# Patient Record
Sex: Male | Born: 1940
Health system: Southern US, Community
[De-identification: ages and names within clinical notes are randomized; demographics above are authoritative.]

## PROBLEM LIST (undated history)

## (undated) ENCOUNTER — Emergency Department (HOSPITAL_COMMUNITY): Admission: EM | Payer: Medicare Other | Source: Home / Self Care

## (undated) DIAGNOSIS — Q2381 Bicuspid aortic valve: Secondary | ICD-10-CM

## (undated) DIAGNOSIS — Q231 Congenital insufficiency of aortic valve: Secondary | ICD-10-CM

## (undated) DIAGNOSIS — Z8719 Personal history of other diseases of the digestive system: Secondary | ICD-10-CM

## (undated) DIAGNOSIS — I517 Cardiomegaly: Secondary | ICD-10-CM

## (undated) DIAGNOSIS — Z8711 Personal history of peptic ulcer disease: Secondary | ICD-10-CM

## (undated) DIAGNOSIS — R001 Bradycardia, unspecified: Secondary | ICD-10-CM

## (undated) DIAGNOSIS — I447 Left bundle-branch block, unspecified: Secondary | ICD-10-CM

## (undated) DIAGNOSIS — N189 Chronic kidney disease, unspecified: Secondary | ICD-10-CM

## (undated) DIAGNOSIS — Z9889 Other specified postprocedural states: Secondary | ICD-10-CM

## (undated) DIAGNOSIS — I509 Heart failure, unspecified: Secondary | ICD-10-CM

## (undated) DIAGNOSIS — I428 Other cardiomyopathies: Secondary | ICD-10-CM

## (undated) DIAGNOSIS — I10 Essential (primary) hypertension: Secondary | ICD-10-CM

## (undated) DIAGNOSIS — M1990 Unspecified osteoarthritis, unspecified site: Secondary | ICD-10-CM

## (undated) DIAGNOSIS — I4819 Other persistent atrial fibrillation: Secondary | ICD-10-CM

## (undated) DIAGNOSIS — K219 Gastro-esophageal reflux disease without esophagitis: Secondary | ICD-10-CM

## (undated) DIAGNOSIS — Z8679 Personal history of other diseases of the circulatory system: Secondary | ICD-10-CM

## (undated) HISTORY — PX: WRIST GANGLION EXCISION: SUR520

## (undated) HISTORY — DX: Essential (primary) hypertension: I10

## (undated) HISTORY — PX: TONSILLECTOMY: SUR1361

## (undated) HISTORY — DX: Other specified postprocedural states: Z98.890

## (undated) HISTORY — DX: Bradycardia, unspecified: R00.1

## (undated) HISTORY — DX: Gastro-esophageal reflux disease without esophagitis: K21.9

## (undated) HISTORY — PX: OTHER SURGICAL HISTORY: SHX169

## (undated) HISTORY — DX: Left bundle-branch block, unspecified: I44.7

## (undated) HISTORY — DX: Other persistent atrial fibrillation: I48.19

## (undated) HISTORY — PX: HERNIA REPAIR: SHX51

## (undated) HISTORY — PX: ROTATOR CUFF REPAIR: SHX139

## (undated) HISTORY — PX: CARDIAC CATHETERIZATION: SHX172

## (undated) HISTORY — DX: Personal history of other diseases of the circulatory system: Z86.79

## (undated) HISTORY — DX: Congenital insufficiency of aortic valve: Q23.1

## (undated) HISTORY — DX: Bicuspid aortic valve: Q23.81

## (undated) HISTORY — DX: Unspecified osteoarthritis, unspecified site: M19.90

---

## 1978-05-26 DIAGNOSIS — Z8679 Personal history of other diseases of the circulatory system: Secondary | ICD-10-CM

## 1978-05-26 HISTORY — DX: Personal history of other diseases of the circulatory system: Z86.79

## 1999-10-11 ENCOUNTER — Encounter: Payer: Self-pay | Admitting: Vascular Surgery

## 1999-10-17 ENCOUNTER — Ambulatory Visit (HOSPITAL_COMMUNITY): Admission: RE | Admit: 1999-10-17 | Discharge: 1999-10-17 | Payer: Self-pay | Admitting: Vascular Surgery

## 1999-10-17 ENCOUNTER — Encounter (INDEPENDENT_AMBULATORY_CARE_PROVIDER_SITE_OTHER): Payer: Self-pay | Admitting: Specialist

## 2004-04-17 ENCOUNTER — Ambulatory Visit: Payer: Self-pay | Admitting: Internal Medicine

## 2004-04-24 ENCOUNTER — Ambulatory Visit: Payer: Self-pay | Admitting: Internal Medicine

## 2004-07-10 ENCOUNTER — Ambulatory Visit: Payer: Self-pay | Admitting: Internal Medicine

## 2004-08-27 ENCOUNTER — Ambulatory Visit: Payer: Self-pay | Admitting: Internal Medicine

## 2004-09-03 ENCOUNTER — Ambulatory Visit: Payer: Self-pay | Admitting: Internal Medicine

## 2004-09-12 ENCOUNTER — Ambulatory Visit: Payer: Self-pay | Admitting: Internal Medicine

## 2005-03-04 ENCOUNTER — Ambulatory Visit: Payer: Self-pay | Admitting: Internal Medicine

## 2005-03-11 ENCOUNTER — Ambulatory Visit: Payer: Self-pay | Admitting: Internal Medicine

## 2005-04-08 ENCOUNTER — Ambulatory Visit: Payer: Self-pay | Admitting: Internal Medicine

## 2005-04-15 ENCOUNTER — Ambulatory Visit: Payer: Self-pay | Admitting: Internal Medicine

## 2005-06-19 ENCOUNTER — Ambulatory Visit: Payer: Self-pay | Admitting: Internal Medicine

## 2005-07-08 ENCOUNTER — Ambulatory Visit: Payer: Self-pay | Admitting: Internal Medicine

## 2005-08-07 ENCOUNTER — Ambulatory Visit: Payer: Self-pay | Admitting: Internal Medicine

## 2005-11-18 ENCOUNTER — Ambulatory Visit: Payer: Self-pay | Admitting: Internal Medicine

## 2005-12-19 ENCOUNTER — Ambulatory Visit: Payer: Self-pay | Admitting: Internal Medicine

## 2005-12-22 ENCOUNTER — Ambulatory Visit: Payer: Self-pay | Admitting: Gastroenterology

## 2005-12-26 ENCOUNTER — Encounter (INDEPENDENT_AMBULATORY_CARE_PROVIDER_SITE_OTHER): Payer: Self-pay | Admitting: *Deleted

## 2005-12-26 ENCOUNTER — Ambulatory Visit: Payer: Self-pay | Admitting: Gastroenterology

## 2005-12-30 ENCOUNTER — Ambulatory Visit (HOSPITAL_COMMUNITY): Admission: RE | Admit: 2005-12-30 | Discharge: 2005-12-30 | Payer: Self-pay | Admitting: Gastroenterology

## 2006-01-20 ENCOUNTER — Ambulatory Visit: Payer: Self-pay | Admitting: Gastroenterology

## 2006-02-23 ENCOUNTER — Ambulatory Visit: Payer: Self-pay | Admitting: Gastroenterology

## 2006-03-12 ENCOUNTER — Ambulatory Visit: Payer: Self-pay | Admitting: Internal Medicine

## 2006-09-10 ENCOUNTER — Ambulatory Visit: Payer: Self-pay | Admitting: Internal Medicine

## 2006-09-10 LAB — CONVERTED CEMR LAB
ALT: 24 units/L (ref 0–40)
AST: 31 units/L (ref 0–37)
Albumin: 4.3 g/dL (ref 3.5–5.2)
Alkaline Phosphatase: 82 units/L (ref 39–117)
BUN: 9 mg/dL (ref 6–23)
Basophils Absolute: 0 10*3/uL (ref 0.0–0.1)
Basophils Relative: 0.2 % (ref 0.0–1.0)
Bilirubin, Direct: 0.2 mg/dL (ref 0.0–0.3)
CO2: 30 meq/L (ref 19–32)
Calcium: 9.1 mg/dL (ref 8.4–10.5)
Chloride: 96 meq/L (ref 96–112)
Cholesterol: 229 mg/dL (ref 0–200)
Creatinine, Ser: 0.7 mg/dL (ref 0.4–1.5)
Direct LDL: 136.7 mg/dL
Eosinophils Absolute: 0.1 10*3/uL (ref 0.0–0.6)
Eosinophils Relative: 2.3 % (ref 0.0–5.0)
GFR calc Af Amer: 145 mL/min
GFR calc non Af Amer: 120 mL/min
Glucose, Bld: 101 mg/dL — ABNORMAL HIGH (ref 70–99)
HCT: 45.9 % (ref 39.0–52.0)
HDL: 75.7 mg/dL (ref >39.0)
Hemoglobin: 15.8 g/dL (ref 13.0–17.0)
Lymphocytes Relative: 31.3 % (ref 12.0–46.0)
MCHC: 34.5 g/dL (ref 30.0–36.0)
MCV: 101.5 fL — ABNORMAL HIGH (ref 78.0–100.0)
Monocytes Absolute: 0.4 10*3/uL (ref 0.2–0.7)
Monocytes Relative: 9.3 % (ref 3.0–11.0)
Neutro Abs: 2.7 10*3/uL (ref 1.4–7.7)
Neutrophils Relative %: 56.9 % (ref 43.0–77.0)
PSA: 0.12 ng/mL (ref 0.10–4.00)
Platelets: 208 10*3/uL (ref 150–400)
Potassium: 3.9 meq/L (ref 3.5–5.1)
RBC: 4.52 M/uL (ref 4.22–5.81)
RDW: 12.4 % (ref 11.5–14.6)
Sodium: 134 meq/L — ABNORMAL LOW (ref 135–145)
Total Bilirubin: 0.9 mg/dL (ref 0.3–1.2)
Total CHOL/HDL Ratio: 3
Total Protein: 7 g/dL (ref 6.0–8.3)
Triglycerides: 38 mg/dL (ref 0–149)
VLDL: 8 mg/dL (ref 0–40)
WBC: 4.7 10*3/uL (ref 4.5–10.5)

## 2006-11-06 ENCOUNTER — Ambulatory Visit: Payer: Self-pay | Admitting: Internal Medicine

## 2007-02-04 ENCOUNTER — Ambulatory Visit: Payer: Self-pay | Admitting: Internal Medicine

## 2007-02-04 DIAGNOSIS — M6740 Ganglion, unspecified site: Secondary | ICD-10-CM

## 2007-02-04 DIAGNOSIS — M1990 Unspecified osteoarthritis, unspecified site: Secondary | ICD-10-CM | POA: Insufficient documentation

## 2007-02-23 ENCOUNTER — Telehealth: Payer: Self-pay | Admitting: Internal Medicine

## 2007-03-31 ENCOUNTER — Ambulatory Visit: Payer: Self-pay | Admitting: Internal Medicine

## 2007-05-28 ENCOUNTER — Telehealth (INDEPENDENT_AMBULATORY_CARE_PROVIDER_SITE_OTHER): Payer: Self-pay | Admitting: *Deleted

## 2007-06-08 ENCOUNTER — Encounter: Payer: Self-pay | Admitting: Internal Medicine

## 2007-06-14 ENCOUNTER — Encounter: Payer: Self-pay | Admitting: Internal Medicine

## 2007-08-24 ENCOUNTER — Ambulatory Visit: Payer: Self-pay | Admitting: Internal Medicine

## 2007-08-24 DIAGNOSIS — T887XXA Unspecified adverse effect of drug or medicament, initial encounter: Secondary | ICD-10-CM | POA: Insufficient documentation

## 2007-08-24 DIAGNOSIS — I4819 Other persistent atrial fibrillation: Secondary | ICD-10-CM

## 2007-08-25 ENCOUNTER — Telehealth: Payer: Self-pay | Admitting: *Deleted

## 2007-08-27 ENCOUNTER — Ambulatory Visit: Payer: Self-pay | Admitting: Internal Medicine

## 2007-08-27 LAB — CONVERTED CEMR LAB
INR: 1.1
Prothrombin Time: 12.7 s
Transferrin: 279.2 mg/dL (ref >=212.0)

## 2007-08-30 ENCOUNTER — Ambulatory Visit: Payer: Self-pay | Admitting: Internal Medicine

## 2007-08-30 LAB — CONVERTED CEMR LAB: INR: 3.7

## 2007-09-01 ENCOUNTER — Ambulatory Visit: Payer: Self-pay

## 2007-09-01 ENCOUNTER — Ambulatory Visit: Payer: Self-pay | Admitting: Cardiovascular Disease

## 2007-09-01 ENCOUNTER — Encounter: Payer: Self-pay | Admitting: Internal Medicine

## 2007-09-06 ENCOUNTER — Ambulatory Visit: Payer: Self-pay | Admitting: Internal Medicine

## 2007-09-06 ENCOUNTER — Encounter: Payer: Self-pay | Admitting: Internal Medicine

## 2007-09-06 ENCOUNTER — Ambulatory Visit: Payer: Self-pay

## 2007-09-06 LAB — CONVERTED CEMR LAB
INR: 2.6
Prothrombin Time: 19.5 s

## 2007-09-22 ENCOUNTER — Ambulatory Visit: Payer: Self-pay | Admitting: Internal Medicine

## 2007-09-23 ENCOUNTER — Ambulatory Visit: Payer: Self-pay | Admitting: Internal Medicine

## 2007-09-30 ENCOUNTER — Ambulatory Visit: Payer: Self-pay | Admitting: Internal Medicine

## 2007-09-30 LAB — CONVERTED CEMR LAB
Basophils Absolute: 0 10*3/uL (ref 0.0–0.1)
Calcium: 9.1 mg/dL (ref 8.4–10.5)
Creatinine, Ser: 1.3 mg/dL (ref 0.4–1.5)
GFR calc Af Amer: 71 mL/min
Glucose, Bld: 108 mg/dL — ABNORMAL HIGH (ref 70–99)
HCT: 46.3 % (ref 39.0–52.0)
Hemoglobin: 15.5 g/dL (ref 13.0–17.0)
MCHC: 33.5 g/dL (ref 30.0–36.0)
Monocytes Absolute: 0.7 10*3/uL (ref 0.1–1.0)
Monocytes Relative: 9.9 % (ref 3.0–12.0)
Neutro Abs: 4.1 10*3/uL (ref 1.4–7.7)
RDW: 13.2 % (ref 11.5–14.6)
Sodium: 129 meq/L — ABNORMAL LOW (ref 135–145)

## 2007-10-01 ENCOUNTER — Ambulatory Visit: Payer: Self-pay | Admitting: Internal Medicine

## 2007-10-06 ENCOUNTER — Ambulatory Visit: Payer: Self-pay | Admitting: Internal Medicine

## 2007-10-06 LAB — CONVERTED CEMR LAB
BUN: 15 mg/dL (ref 6–23)
Chloride: 97 meq/L (ref 96–112)
Creatinine, Ser: 1 mg/dL (ref 0.4–1.5)
GFR calc non Af Amer: 79 mL/min
Glucose, Bld: 124 mg/dL — ABNORMAL HIGH (ref 70–99)
Prothrombin Time: 20 s — ABNORMAL HIGH (ref 10.9–13.3)

## 2007-10-07 ENCOUNTER — Inpatient Hospital Stay (HOSPITAL_COMMUNITY): Admission: AD | Admit: 2007-10-07 | Discharge: 2007-10-10 | Payer: Self-pay | Admitting: Cardiovascular Disease

## 2007-10-07 ENCOUNTER — Ambulatory Visit: Payer: Self-pay | Admitting: Cardiology

## 2007-10-15 ENCOUNTER — Ambulatory Visit: Payer: Self-pay | Admitting: Internal Medicine

## 2007-10-20 ENCOUNTER — Ambulatory Visit: Payer: Self-pay | Admitting: Internal Medicine

## 2007-10-27 ENCOUNTER — Ambulatory Visit: Payer: Self-pay | Admitting: Internal Medicine

## 2007-11-23 ENCOUNTER — Ambulatory Visit: Payer: Self-pay | Admitting: Internal Medicine

## 2007-11-23 LAB — CONVERTED CEMR LAB
INR: 1.4
Prothrombin Time: 14.6 s

## 2007-12-07 ENCOUNTER — Ambulatory Visit: Payer: Self-pay | Admitting: Internal Medicine

## 2007-12-07 ENCOUNTER — Encounter: Payer: Self-pay | Admitting: Internal Medicine

## 2007-12-07 ENCOUNTER — Ambulatory Visit: Payer: Self-pay

## 2007-12-14 ENCOUNTER — Telehealth: Payer: Self-pay | Admitting: Gastroenterology

## 2007-12-17 ENCOUNTER — Ambulatory Visit: Payer: Self-pay | Admitting: Internal Medicine

## 2007-12-17 LAB — CONVERTED CEMR LAB
INR: 1.7
Prothrombin Time: 15.3 s

## 2008-01-07 ENCOUNTER — Ambulatory Visit: Payer: Self-pay | Admitting: Gastroenterology

## 2008-01-07 DIAGNOSIS — K219 Gastro-esophageal reflux disease without esophagitis: Secondary | ICD-10-CM

## 2008-02-17 ENCOUNTER — Ambulatory Visit: Payer: Self-pay | Admitting: Internal Medicine

## 2008-03-16 ENCOUNTER — Ambulatory Visit: Payer: Self-pay | Admitting: Internal Medicine

## 2008-04-18 ENCOUNTER — Ambulatory Visit: Payer: Self-pay | Admitting: Internal Medicine

## 2008-04-18 LAB — CONVERTED CEMR LAB: Prothrombin Time: 18.7 s

## 2008-05-11 ENCOUNTER — Ambulatory Visit: Payer: Self-pay | Admitting: Internal Medicine

## 2008-05-23 ENCOUNTER — Telehealth: Payer: Self-pay | Admitting: Family Medicine

## 2008-05-23 ENCOUNTER — Encounter: Payer: Self-pay | Admitting: Internal Medicine

## 2008-05-31 ENCOUNTER — Ambulatory Visit: Payer: Self-pay | Admitting: Internal Medicine

## 2008-06-12 ENCOUNTER — Ambulatory Visit: Payer: Self-pay | Admitting: Internal Medicine

## 2008-06-12 LAB — CONVERTED CEMR LAB: INR: 1.3

## 2008-06-29 ENCOUNTER — Ambulatory Visit: Payer: Self-pay | Admitting: Internal Medicine

## 2008-06-29 LAB — CONVERTED CEMR LAB: Prothrombin Time: 17.1 s

## 2008-07-27 ENCOUNTER — Ambulatory Visit: Payer: Self-pay | Admitting: Internal Medicine

## 2008-07-27 LAB — CONVERTED CEMR LAB
INR: 1.7
Prothrombin Time: 16.3 s

## 2008-08-10 ENCOUNTER — Ambulatory Visit: Payer: Self-pay | Admitting: Internal Medicine

## 2008-08-15 ENCOUNTER — Encounter: Payer: Self-pay | Admitting: Internal Medicine

## 2008-08-15 ENCOUNTER — Ambulatory Visit: Payer: Self-pay | Admitting: Internal Medicine

## 2008-08-21 ENCOUNTER — Ambulatory Visit: Payer: Self-pay | Admitting: Internal Medicine

## 2008-08-21 LAB — CONVERTED CEMR LAB
BUN: 14 mg/dL (ref 6–23)
Basophils Absolute: 0.1 10*3/uL (ref 0.0–0.1)
Eosinophils Absolute: 0.1 10*3/uL (ref 0.0–0.7)
GFR calc non Af Amer: 102.1 mL/min (ref >60)
Glucose, Bld: 106 mg/dL — ABNORMAL HIGH (ref 70–99)
HCT: 42.7 % (ref 39.0–52.0)
Lymphs Abs: 1.6 10*3/uL (ref 0.7–4.0)
MCV: 101.7 fL — ABNORMAL HIGH (ref 78.0–100.0)
Monocytes Absolute: 0.7 10*3/uL (ref 0.1–1.0)
Monocytes Relative: 10.2 % (ref 3.0–12.0)
Platelets: 226 10*3/uL (ref 150.0–400.0)
Potassium: 4.5 meq/L (ref 3.5–5.1)
Prothrombin Time: 40.1 s — ABNORMAL HIGH (ref 10.9–13.3)
RDW: 12.6 % (ref 11.5–14.6)

## 2008-08-22 ENCOUNTER — Ambulatory Visit (HOSPITAL_COMMUNITY): Admission: RE | Admit: 2008-08-22 | Discharge: 2008-08-22 | Payer: Self-pay | Admitting: Internal Medicine

## 2008-08-22 ENCOUNTER — Ambulatory Visit: Payer: Self-pay | Admitting: Internal Medicine

## 2008-08-28 ENCOUNTER — Ambulatory Visit: Payer: Self-pay | Admitting: Internal Medicine

## 2008-08-31 ENCOUNTER — Ambulatory Visit: Payer: Self-pay | Admitting: Internal Medicine

## 2008-08-31 ENCOUNTER — Ambulatory Visit: Payer: Self-pay

## 2008-09-05 ENCOUNTER — Telehealth: Payer: Self-pay | Admitting: Internal Medicine

## 2008-09-11 ENCOUNTER — Ambulatory Visit: Payer: Self-pay | Admitting: Internal Medicine

## 2008-09-11 DIAGNOSIS — F5102 Adjustment insomnia: Secondary | ICD-10-CM

## 2008-10-03 ENCOUNTER — Ambulatory Visit: Payer: Self-pay | Admitting: Internal Medicine

## 2008-10-24 ENCOUNTER — Ambulatory Visit: Payer: Self-pay | Admitting: Internal Medicine

## 2008-10-24 LAB — CONVERTED CEMR LAB
INR: 1.7
Prothrombin Time: 16.2 s

## 2008-11-14 ENCOUNTER — Ambulatory Visit: Payer: Self-pay | Admitting: Internal Medicine

## 2008-11-14 LAB — CONVERTED CEMR LAB
INR: 2.5
Prothrombin Time: 19.2 s

## 2008-11-30 ENCOUNTER — Ambulatory Visit: Payer: Self-pay | Admitting: Internal Medicine

## 2008-12-14 ENCOUNTER — Ambulatory Visit: Payer: Self-pay | Admitting: Internal Medicine

## 2009-01-18 ENCOUNTER — Ambulatory Visit: Payer: Self-pay | Admitting: Internal Medicine

## 2009-01-18 LAB — CONVERTED CEMR LAB: Prothrombin Time: 19.2 s

## 2009-02-14 ENCOUNTER — Ambulatory Visit: Payer: Self-pay | Admitting: Internal Medicine

## 2009-03-14 ENCOUNTER — Ambulatory Visit: Payer: Self-pay | Admitting: Internal Medicine

## 2009-04-05 ENCOUNTER — Ambulatory Visit: Payer: Self-pay | Admitting: Internal Medicine

## 2009-04-05 LAB — CONVERTED CEMR LAB
ALT: 25 units/L (ref 0–53)
Alkaline Phosphatase: 72 units/L (ref 39–117)
Basophils Relative: 0.9 % (ref 0.0–3.0)
Bilirubin, Direct: 0 mg/dL (ref 0.0–0.3)
Blood in Urine, dipstick: NEGATIVE
Chloride: 96 meq/L (ref 96–112)
Creatinine, Ser: 0.7 mg/dL (ref 0.4–1.5)
Eosinophils Relative: 2.1 % (ref 0.0–5.0)
Glucose, Urine, Semiquant: NEGATIVE
HDL: 74 mg/dL (ref >39.00)
Hemoglobin: 15.6 g/dL (ref 13.0–17.0)
INR: 1.8
Ketones, urine, test strip: NEGATIVE
Lymphocytes Relative: 39.1 % (ref 12.0–46.0)
MCV: 105.9 fL — ABNORMAL HIGH (ref 78.0–100.0)
Monocytes Absolute: 0.6 10*3/uL (ref 0.1–1.0)
Neutrophils Relative %: 45.5 % (ref 43.0–77.0)
RBC: 4.33 M/uL (ref 4.22–5.81)
Specific Gravity, Urine: 1.015
Total Protein: 7.1 g/dL (ref 6.0–8.3)
Triglycerides: 53 mg/dL (ref 0.0–149.0)
WBC: 4.8 10*3/uL (ref 4.5–10.5)
pH: 8.5

## 2009-04-12 ENCOUNTER — Ambulatory Visit: Payer: Self-pay | Admitting: Internal Medicine

## 2009-04-12 DIAGNOSIS — E875 Hyperkalemia: Secondary | ICD-10-CM

## 2009-04-12 LAB — CONVERTED CEMR LAB
BUN: 13 mg/dL (ref 6–23)
CO2: 28 meq/L (ref 19–32)
Chloride: 94 meq/L — ABNORMAL LOW (ref 96–112)
Creatinine, Ser: 0.8 mg/dL (ref 0.4–1.5)
Glucose, Bld: 107 mg/dL — ABNORMAL HIGH (ref 70–99)

## 2009-05-08 ENCOUNTER — Ambulatory Visit: Payer: Self-pay | Admitting: Internal Medicine

## 2009-06-07 ENCOUNTER — Ambulatory Visit: Payer: Self-pay | Admitting: Internal Medicine

## 2009-06-14 ENCOUNTER — Telehealth: Payer: Self-pay | Admitting: Internal Medicine

## 2009-06-21 ENCOUNTER — Encounter: Payer: Self-pay | Admitting: Internal Medicine

## 2009-06-21 ENCOUNTER — Ambulatory Visit: Payer: Self-pay | Admitting: Internal Medicine

## 2009-06-21 LAB — CONVERTED CEMR LAB: POC INR: 2.1

## 2009-06-22 ENCOUNTER — Ambulatory Visit: Payer: Self-pay | Admitting: Internal Medicine

## 2009-06-22 ENCOUNTER — Ambulatory Visit (HOSPITAL_COMMUNITY): Admission: RE | Admit: 2009-06-22 | Discharge: 2009-06-22 | Payer: Self-pay | Admitting: Internal Medicine

## 2009-06-23 ENCOUNTER — Inpatient Hospital Stay (HOSPITAL_COMMUNITY): Admission: EM | Admit: 2009-06-23 | Discharge: 2009-06-24 | Payer: Self-pay | Admitting: Emergency Medicine

## 2009-06-23 ENCOUNTER — Ambulatory Visit: Payer: Self-pay | Admitting: Cardiology

## 2009-06-28 ENCOUNTER — Ambulatory Visit: Payer: Self-pay | Admitting: Internal Medicine

## 2009-06-28 LAB — CONVERTED CEMR LAB: INR: 3.5

## 2009-07-05 ENCOUNTER — Encounter: Payer: Self-pay | Admitting: Physician Assistant

## 2009-07-05 ENCOUNTER — Ambulatory Visit: Payer: Self-pay | Admitting: Cardiology

## 2009-07-05 DIAGNOSIS — R0989 Other specified symptoms and signs involving the circulatory and respiratory systems: Secondary | ICD-10-CM

## 2009-07-24 ENCOUNTER — Encounter: Payer: Self-pay | Admitting: Internal Medicine

## 2009-07-24 ENCOUNTER — Ambulatory Visit: Payer: Self-pay

## 2009-07-26 ENCOUNTER — Ambulatory Visit: Payer: Self-pay | Admitting: Internal Medicine

## 2009-08-01 ENCOUNTER — Ambulatory Visit: Payer: Self-pay | Admitting: Internal Medicine

## 2009-08-06 LAB — CONVERTED CEMR LAB
GFR calc non Af Amer: 118.78 mL/min (ref >60)
Glucose, Bld: 81 mg/dL (ref 70–99)
Potassium: 4.6 meq/L (ref 3.5–5.1)
Sodium: 127 meq/L — ABNORMAL LOW (ref 135–145)

## 2009-08-08 ENCOUNTER — Ambulatory Visit: Payer: Self-pay | Admitting: Internal Medicine

## 2009-08-08 DIAGNOSIS — E871 Hypo-osmolality and hyponatremia: Secondary | ICD-10-CM | POA: Insufficient documentation

## 2009-08-08 DIAGNOSIS — K4090 Unilateral inguinal hernia, without obstruction or gangrene, not specified as recurrent: Secondary | ICD-10-CM | POA: Insufficient documentation

## 2009-08-20 ENCOUNTER — Telehealth: Payer: Self-pay | Admitting: Internal Medicine

## 2009-08-27 ENCOUNTER — Ambulatory Visit: Payer: Self-pay | Admitting: Internal Medicine

## 2009-08-27 ENCOUNTER — Telehealth: Payer: Self-pay | Admitting: Internal Medicine

## 2009-08-27 LAB — CONVERTED CEMR LAB
INR: 2.1
Prothrombin Time: 17.9 s

## 2009-08-28 ENCOUNTER — Ambulatory Visit: Payer: Self-pay | Admitting: Internal Medicine

## 2009-09-24 ENCOUNTER — Ambulatory Visit: Payer: Self-pay | Admitting: Internal Medicine

## 2009-09-24 LAB — CONVERTED CEMR LAB
INR: 2.8
Prothrombin Time: 20.1 s

## 2009-10-03 ENCOUNTER — Ambulatory Visit: Payer: Self-pay | Admitting: Internal Medicine

## 2009-10-03 LAB — CONVERTED CEMR LAB
Calcium: 8.8 mg/dL (ref 8.4–10.5)
Chloride: 91 meq/L — ABNORMAL LOW (ref 96–112)
Creatinine, Ser: 0.7 mg/dL (ref 0.4–1.5)
GFR calc non Af Amer: 114.92 mL/min (ref >60)

## 2009-10-11 ENCOUNTER — Ambulatory Visit: Payer: Self-pay | Admitting: Internal Medicine

## 2009-10-12 ENCOUNTER — Encounter: Payer: Self-pay | Admitting: Internal Medicine

## 2009-10-29 ENCOUNTER — Ambulatory Visit: Payer: Self-pay | Admitting: Internal Medicine

## 2009-10-29 LAB — CONVERTED CEMR LAB: INR: 2.1

## 2009-11-07 ENCOUNTER — Ambulatory Visit: Payer: Self-pay | Admitting: Internal Medicine

## 2009-11-07 DIAGNOSIS — K117 Disturbances of salivary secretion: Secondary | ICD-10-CM

## 2009-11-29 ENCOUNTER — Ambulatory Visit: Payer: Self-pay | Admitting: Internal Medicine

## 2009-11-29 LAB — CONVERTED CEMR LAB
Calcium: 8.9 mg/dL (ref 8.4–10.5)
Creatinine, Ser: 0.8 mg/dL (ref 0.4–1.5)
GFR calc non Af Amer: 101.72 mL/min (ref >60)
INR: 1.9

## 2009-12-12 ENCOUNTER — Ambulatory Visit: Payer: Self-pay | Admitting: Internal Medicine

## 2009-12-27 ENCOUNTER — Ambulatory Visit: Payer: Self-pay | Admitting: Internal Medicine

## 2009-12-27 LAB — CONVERTED CEMR LAB: INR: 2.9

## 2010-01-02 ENCOUNTER — Ambulatory Visit: Payer: Self-pay | Admitting: Internal Medicine

## 2010-01-02 ENCOUNTER — Telehealth: Payer: Self-pay | Admitting: Internal Medicine

## 2010-01-02 LAB — CONVERTED CEMR LAB
Calcium: 9 mg/dL (ref 8.4–10.5)
Creatinine, Ser: 0.8 mg/dL (ref 0.4–1.5)
GFR calc non Af Amer: 104.7 mL/min (ref >60)
Sodium: 128 meq/L — ABNORMAL LOW (ref 135–145)

## 2010-01-09 ENCOUNTER — Ambulatory Visit: Payer: Self-pay | Admitting: Internal Medicine

## 2010-01-24 ENCOUNTER — Ambulatory Visit: Payer: Self-pay | Admitting: Internal Medicine

## 2010-02-18 ENCOUNTER — Ambulatory Visit: Payer: Self-pay | Admitting: Internal Medicine

## 2010-02-18 LAB — CONVERTED CEMR LAB: INR: 1.4

## 2010-03-06 ENCOUNTER — Ambulatory Visit: Payer: Self-pay | Admitting: Internal Medicine

## 2010-03-06 LAB — CONVERTED CEMR LAB
BUN: 14 mg/dL (ref 6–23)
CO2: 29 meq/L (ref 19–32)
Calcium: 8.9 mg/dL (ref 8.4–10.5)
Creatinine, Ser: 0.8 mg/dL (ref 0.4–1.5)
INR: 1.5

## 2010-03-13 ENCOUNTER — Telehealth (INDEPENDENT_AMBULATORY_CARE_PROVIDER_SITE_OTHER): Payer: Self-pay | Admitting: *Deleted

## 2010-03-13 ENCOUNTER — Ambulatory Visit: Payer: Self-pay | Admitting: Internal Medicine

## 2010-03-13 LAB — CONVERTED CEMR LAB
Osmolality, Ur: 156 mOsm/kg — ABNORMAL LOW (ref 390–1090)
Sodium: 122 meq/L — ABNORMAL LOW (ref 135–145)

## 2010-03-14 ENCOUNTER — Encounter: Payer: Self-pay | Admitting: Internal Medicine

## 2010-03-15 ENCOUNTER — Ambulatory Visit: Payer: Self-pay | Admitting: Internal Medicine

## 2010-04-05 ENCOUNTER — Telehealth (INDEPENDENT_AMBULATORY_CARE_PROVIDER_SITE_OTHER): Payer: Self-pay | Admitting: *Deleted

## 2010-04-10 ENCOUNTER — Ambulatory Visit: Payer: Self-pay | Admitting: Internal Medicine

## 2010-04-10 LAB — CONVERTED CEMR LAB
BUN: 16 mg/dL (ref 6–23)
CO2: 26 meq/L (ref 19–32)
Calcium: 8.9 mg/dL (ref 8.4–10.5)
Creatinine, Ser: 0.8 mg/dL (ref 0.4–1.5)

## 2010-04-17 ENCOUNTER — Ambulatory Visit: Payer: Self-pay | Admitting: Internal Medicine

## 2010-05-08 ENCOUNTER — Ambulatory Visit: Payer: Self-pay | Admitting: Internal Medicine

## 2010-06-05 ENCOUNTER — Ambulatory Visit
Admission: RE | Admit: 2010-06-05 | Discharge: 2010-06-05 | Payer: Self-pay | Source: Home / Self Care | Attending: Internal Medicine | Admitting: Internal Medicine

## 2010-06-05 LAB — CONVERTED CEMR LAB: INR: 2.1

## 2010-06-23 LAB — CONVERTED CEMR LAB
ALT: 21 units/L (ref 0–53)
AST: 26 units/L (ref 0–37)
Albumin: 4.4 g/dL (ref 3.5–5.2)
Alkaline Phosphatase: 81 units/L (ref 39–117)
BUN: 7 mg/dL (ref 6–23)
Basophils Absolute: 0.1 10*3/uL (ref 0.0–0.1)
Basophils Relative: 0.6 % (ref 0.0–3.0)
Basophils Relative: 1 % (ref 0.0–1.0)
Bilirubin, Direct: 0.3 mg/dL (ref 0.0–0.3)
CO2: 29 meq/L (ref 19–32)
CO2: 30 meq/L (ref 19–32)
Calcium: 8.8 mg/dL (ref 8.4–10.5)
Calcium: 9.7 mg/dL (ref 8.4–10.5)
Chloride: 100 meq/L (ref 96–112)
Chloride: 94 meq/L — ABNORMAL LOW (ref 96–112)
Cholesterol: 238 mg/dL (ref 0–200)
Creatinine, Ser: 0.9 mg/dL (ref 0.4–1.5)
Direct LDL: 153.4 mg/dL
Eosinophils Absolute: 0.1 10*3/uL (ref 0.0–0.7)
Eosinophils Absolute: 0.1 10*3/uL (ref 0.0–0.7)
Eosinophils Relative: 1.6 % (ref 0.0–5.0)
GFR calc Af Amer: 108 mL/min
GFR calc non Af Amer: 89 mL/min
Glucose, Bld: 118 mg/dL — ABNORMAL HIGH (ref 70–99)
HCT: 52.1 % — ABNORMAL HIGH (ref 39.0–52.0)
HDL: 76.5 mg/dL (ref >39.0)
Hemoglobin: 15.2 g/dL (ref 13.0–17.0)
Hemoglobin: 17.2 g/dL — ABNORMAL HIGH (ref 13.0–17.0)
INR: 1
Lymphocytes Relative: 30.1 % (ref 12.0–46.0)
Lymphocytes Relative: 33.6 % (ref 12.0–46.0)
MCHC: 32.9 g/dL (ref 30.0–36.0)
MCHC: 33.5 g/dL (ref 30.0–36.0)
MCV: 103.5 fL — ABNORMAL HIGH (ref 78.0–100.0)
MCV: 105.3 fL — ABNORMAL HIGH (ref 78.0–100.0)
Monocytes Absolute: 0.7 10*3/uL (ref 0.1–1.0)
Monocytes Relative: 12.3 % — ABNORMAL HIGH (ref 3.0–12.0)
Neutro Abs: 2.6 10*3/uL (ref 1.4–7.7)
Neutro Abs: 2.8 10*3/uL (ref 1.4–7.7)
Neutrophils Relative %: 51.5 % (ref 43.0–77.0)
Platelets: 212 10*3/uL (ref 150–400)
Potassium: 5.6 meq/L — ABNORMAL HIGH (ref 3.5–5.1)
Prothrombin Time: 12.3 s
RBC: 4.3 M/uL (ref 4.22–5.81)
RBC: 5.03 M/uL (ref 4.22–5.81)
RDW: 13.4 % (ref 11.5–14.6)
Sodium: 127 meq/L — ABNORMAL LOW (ref 135–145)
Sodium: 136 meq/L (ref 135–145)
TSH: 1.39 microintl units/mL (ref 0.35–5.50)
Total Bilirubin: 1.3 mg/dL — ABNORMAL HIGH (ref 0.3–1.2)
Total CHOL/HDL Ratio: 3.1
Total Protein: 7.3 g/dL (ref 6.0–8.3)
Triglycerides: 55 mg/dL (ref 0–149)
VLDL: 11 mg/dL (ref 0–40)
WBC: 5.3 10*3/uL (ref 4.5–10.5)

## 2010-06-25 NOTE — Assessment & Plan Note (Signed)
Summary: pt/njr  Medications Added DOXEPIN HCL 10 MG CAPS (DOXEPIN HCL) 1 at bedtime DOXEPIN HCL 10 MG CAPS (DOXEPIN HCL)        Nurse Visit   Allergies: No Known Drug Allergies Laboratory Results   Blood Tests     PT: 17.9 s   (Normal Range: 10.6-13.4)  INR: 2.1   (Normal Range: 0.88-1.12   Therap INR: 2.0-3.5) Comments: Rita Ohara  August 27, 2009 1:55 PM     Orders Added: 1)  Est. Patient Level I [71062] 2)  Protime [[id number]] Prescriptions: DOXEPIN HCL 10 MG CAPS (DOXEPIN HCL) 1 at bedtime  #30 x 6   Entered by:   Willy Eddy, LPN   Authorized by:   Stacie Glaze MD   Signed by:   Willy Eddy, LPN on 27/07/5007   Method used:   [facility]ron[facility] to        [facility] [address] [phone]* (retail)       [address]       [place], [place]  [address]       Ph: [phone] or [phone]       Fax: [phone]   RxID:   [id number]    ANTICOAGULATION RECORD PREVIOUS REGIMEN & LAB RESULTS Anticoagulation Diagnosis:  Atrial fibrillation on  12/14/2008 Previous INR Goal Range:  2.0-3.0 on  08/24/2007 Previous INR:  1.9 on  07/26/2009 Previous Coumadin Dose(mg):  5,5,7.5 on  12/14/2008 Previous Regimen:  7.5mg . TH 5mg . others on  07/26/2009 Previous Coagulation Comments:  Dr. Kirtland Bouchard approved on  06/28/2009  NEW REGIMEN & LAB RESULTS Current INR: 2.1 Regimen: same  Repeat testing in: 1 month  Anticoagulation Visit Questionnaire Coumadin dose missed/changed:  No Abnormal Bleeding Symptoms:  No  Any diet changes including alcohol intake, vegetables or greens since the last visit:  No Any illnesses or hospitalizations since the last visit:  No Any signs of clotting since the last visit (including chest discomfort, dizziness, shortness of breath, arm tingling, slurred speech, swelling or redness in leg):  No  MEDICATIONS MELOXICAM 15 MG TABS (MELOXICAM) once daily GLUCOSAMINE RELIEF 1000 MG TABS (GLUCOSAMINE SULFATE) 1 two  times a day B-100 BALANCED TR   TBCR (B COMPLEX-FOLIC ACID) once daily NIACINAMIDE 500 MG TABS (NIACINAMIDE) 2 tablets once daily FINASTERIDE 5 MG TABS (FINASTERIDE) 1/4 tab once daily MILK THISTLE 500 MG  CAPS (MILK THISTLE) 2 once daily OMEPRAZOLE 20 MG  CPDR (OMEPRAZOLE) once daily RA FLAX SEED OIL 1000 1000 MG  CAPS (FLAXSEED (LINSEED)) three times a day BISOPROLOL FUMARATE 5 MG TABS (BISOPROLOL FUMARATE) 1/2 tablet by mouth daily WARFARIN SODIUM 5 MG TABS (WARFARIN SODIUM) Use as directed by Anticoagulation Clinic MICARDIS 80 MG TABS (TELMISARTAN) 1 once daily FUROSEMIDE 20 MG  TABS (FUROSEMIDE) 1 once daily AMLODIPINE BESYLATE 5 MG TABS (AMLODIPINE BESYLATE) take one tablet once daily PROPAFENONE HCL 225 MG TABS (PROPAFENONE HCL) 1 two times a day DOXEPIN HCL 10 MG CAPS (DOXEPIN HCL) 1 at bedtime ALPRAZOLAM 0.5 MG TABS (ALPRAZOLAM) one by mouth q 6 hour prn DOXEPIN HCL 10 MG CAPS (DOXEPIN HCL)

## 2010-06-25 NOTE — Assessment & Plan Note (Signed)
Summary: pt/Travis [name]   Nurse Visit   Allergies: No Known Drug Allergies Laboratory Results   Blood Tests      INR: 2.9   (Normal Range: 0.88-1.12   Therap INR: 2.0-3.5) Comments: Rita Ohara  December 27, 2009 10:37 AM     Orders Added: 1)  Est. Patient Level I [99211] 2)  Protime [[id number]]   ANTICOAGULATION RECORD PREVIOUS REGIMEN & LAB RESULTS Anticoagulation Diagnosis:  Atrial fibrillation on  12/14/2008 Previous INR Goal Range:  2.0-3.0 on  08/24/2007 Previous INR:  1.9 on  11/29/2009 Previous Coumadin Dose(mg):  7.5mg  on thu 5mg  on other days on  11/29/2009 Previous Regimen:  same on  10/29/2009 Previous Coagulation Comments:  Dr. Kirtland Bouchard approved on  06/28/2009  NEW REGIMEN & LAB RESULTS Current INR: 2.9 Regimen: same  Repeat testing in: 4 week  Anticoagulation Visit Questionnaire Coumadin dose missed/changed:  No Abnormal Bleeding Symptoms:  No  Any diet changes including alcohol intake, vegetables or greens since the last visit:  No Any illnesses or hospitalizations since the last visit:  No Any signs of clotting since the last visit (including chest discomfort, dizziness, shortness of breath, arm tingling, slurred speech, swelling or redness in leg):  No  MEDICATIONS OMEPRAZOLE 20 MG  CPDR (OMEPRAZOLE) once daily MELOXICAM 15 MG TABS (MELOXICAM) once daily GLUCOSAMINE RELIEF 1000 MG TABS (GLUCOSAMINE SULFATE) 1 two times a day B-100 BALANCED TR   TBCR (B COMPLEX-FOLIC ACID) once daily NIACINAMIDE 500 MG TABS (NIACINAMIDE) 2 tablets once daily FINASTERIDE 5 MG TABS (FINASTERIDE) 1/4 tab once daily MILK THISTLE 500 MG  CAPS (MILK THISTLE) 2 once daily RA FLAX SEED OIL 1000 1000 MG  CAPS (FLAXSEED (LINSEED)) three times a day BYSTOLIC 5 MG TABS (NEBIVOLOL HCL) one by mouth daily WARFARIN SODIUM 5 MG TABS (WARFARIN SODIUM) Use as directed by Anticoagulation Clinic FUROSEMIDE 20 MG  TABS (FUROSEMIDE) 1 once daily AMLODIPINE BESYLATE 5 MG TABS (AMLODIPINE  BESYLATE) take one tablet once daily PROPAFENONE HCL 225 MG TABS (PROPAFENONE HCL) Take 1 and 1/2 tablets twice daily ALPRAZOLAM 0.5 MG TABS (ALPRAZOLAM) one by mouth q 6 hour prn DOXEPIN HCL 10 MG CAPS (DOXEPIN HCL) 1 at bedtime

## 2010-06-25 NOTE — Assessment & Plan Note (Signed)
Summary: 2 month rov/njr   Vital Signs:  Patient profile:   70 year old male Height:      68 inches Weight:      182 pounds BMI:     27.77 Temp:     97.6 degrees F oral Resp:     16 per minute BP sitting:   120 / 80  (left arm) Cuff size:   regular  Vitals Entered By: Duard Brady LPN (March 13, 2010 4:06 PM) CC: 2 mos rov - doing well  , f/u bloodwork, Hypertension Management Is Patient Diabetic? No  Flu Vaccine Consent Questions     Do you have a history of severe allergic reactions to this vaccine? no    Any prior history of allergic reactions to egg and/or gelatin? no    Do you have a sensitivity to the preservative Thimersol? no    Do you have a past history of Guillan-Barre Syndrome? no    Do you currently have an acute febrile illness? no    Have you ever had a severe reaction to latex? no    Vaccine information given and explained to patient? yes    Are you currently pregnant? no    Lot Number:[id number]   Exp Date:11/23/2010   Site Given  Left Deltoid IM  Primary Care Provider:  Peri Jefferson  CC:  2 mos rov - doing well  , f/u bloodwork, and Hypertension Management.  History of Present Illness: has Hyponatremia has a terible cough since started the new medications Has a hx of smoking the pt feels  but the labs are worse has a hx of anyrusm hx  brain the pt has no hx consistant with water intoxication hx of smoking  SIADH   Hypertension History:      He denies headache, chest pain, palpitations, dyspnea with exertion, orthopnea, PND, peripheral edema, visual symptoms, neurologic problems, syncope, and side effects from treatment.        Positive major cardiovascular risk factors include male age 17 years old or older and hypertension.  Negative major cardiovascular risk factors include no history of diabetes or hyperlipidemia, negative family history for ischemic heart disease, and non-tobacco-user status.        Further assessment for target organ  damage reveals no history of ASHD, cardiac end-organ damage (CHF/LVH), stroke/TIA, peripheral vascular disease, renal insufficiency, or hypertensive retinopathy.     Preventive Screening-Counseling & Management  Alcohol-Tobacco     Smoking Status: quit     Passive Smoke Exposure: no     Tobacco Counseling: to remain off tobacco products  Problems Prior to Update: 1)  ? of Syndrome of Inappropriate Antidiuretic Hormone Secretion  (ICD-253.6) 2)  Dry Mouth  (ICD-527.7) 3)  Abnormal Electrocardiogram  (ICD-794.31) 4)  Dry Mouth  (ICD-527.7) 5)  Hyponatremia  (ICD-276.1) 6)  Ing Hern w/o Mention Obst/gangren Unilat/unspec  (ICD-550.90) 7)  Carotid Bruit  (ICD-785.9) 8)  Atrial Fibrillation  (ICD-427.31) 9)  Cardiomyopathy Tachycardia- Resolved  (ICD-425.9) 10)  Coumadin Therapy  (ICD-V58.61) 11)  Hyperkalemia  (ICD-276.7) 12)  Transient Disorder Initiating/maintaining Sleep  (ICD-307.41) 13)  Hypertension  (ICD-401.9) 14)  Ganglion Cyst, Wrist, Left  (ICD-727.41) 15)  Gerd  (ICD-530.81) 16)  Encounter For Therapeutic Drug Monitoring  (ICD-V58.83) 17)  Uns Advrs Eff Uns Rx Medicinal&biological Sbstnc  (ICD-995.20) 18)  Ganglion Cyst, Wrist, Left  (ICD-727.41) 19)  Osteoarthritis  (ICD-715.90)  Medications Prior to Update: 1)  Omeprazole 20 Mg  Cpdr (Omeprazole) .... Once Daily 2)  Meloxicam 15 Mg Tabs (Meloxicam) .... Once Daily 3)  Glucosamine Relief 1000 Mg Tabs (Glucosamine Sulfate) .Marland Kitchen.. 1 Two Times A Day 4)  B-100 Balanced Tr   Tbcr (B Complex-Folic Acid) .... Once Daily 5)  Niacinamide 500 Mg Tabs (Niacinamide) .... 2 Tablets Once Daily 6)  Finasteride 5 Mg Tabs (Finasteride) .... 1/4 Tab Once Daily 7)  Milk Thistle 500 Mg  Caps (Milk Thistle) .... 2 Once Daily 8)  Ra Flax Seed Oil 1000 1000 Mg  Caps (Flaxseed (Linseed)) .... Three Times A Day 9)  Micardis 40 Mg Tabs (Telmisartan) .... One By Mouth Daily 10)  Warfarin Sodium 5 Mg Tabs (Warfarin Sodium) .... Use As Directed By  Anticoagulation Clinic 11)  Chlorthalidone 25 Mg Tabs (Chlorthalidone) .... 1/2 By Mouth Daily Replaces The Furosemide 12)  Amlodipine Besylate 5 Mg Tabs (Amlodipine Besylate) .... Take One Tablet Once Daily 13)  Propafenone Hcl 225 Mg Tabs (Propafenone Hcl) .... Take 1 and 1/2 Tablets Twice Daily 14)  Alprazolam 0.5 Mg Tabs (Alprazolam) .... One By Mouth Q 6 Hour Prn 15)  Doxepin Hcl 10 Mg Caps (Doxepin Hcl) .Marland Kitchen.. 1 At Bedtime  Current Medications (verified): 1)  Omeprazole 20 Mg  Cpdr (Omeprazole) .... Once Daily 2)  Meloxicam 15 Mg Tabs (Meloxicam) .... Once Daily 3)  Glucosamine Relief 1000 Mg Tabs (Glucosamine Sulfate) .Marland Kitchen.. 1 Two Times A Day 4)  B-100 Balanced Tr   Tbcr (B Complex-Folic Acid) .... Once Daily 5)  Niacinamide 500 Mg Tabs (Niacinamide) .... 2 Tablets Once Daily 6)  Finasteride 5 Mg Tabs (Finasteride) .... 1/4 Tab Once Daily 7)  Milk Thistle 500 Mg  Caps (Milk Thistle) .... 2 Once Daily 8)  Ra Flax Seed Oil 1000 1000 Mg  Caps (Flaxseed (Linseed)) .... Three Times A Day 9)  Micardis 40 Mg Tabs (Telmisartan) .... One By Mouth Daily 10)  Warfarin Sodium 5 Mg Tabs (Warfarin Sodium) .... Use As Directed By Anticoagulation Clinic 11)  Furosemide 20 Mg Tabs (Furosemide) .... One By Mouth Daily 12)  Amlodipine Besylate 5 Mg Tabs (Amlodipine Besylate) .... Take One Tablet Once Daily 13)  Propafenone Hcl 225 Mg Tabs (Propafenone Hcl) .... Take 1 and 1/2 Tablets Twice Daily 14)  Alprazolam 0.5 Mg Tabs (Alprazolam) .... One By Mouth Q 6 Hour Prn 15)  Doxepin Hcl 10 Mg Caps (Doxepin Hcl) .Marland Kitchen.. 1 At Bedtime  Allergies (verified): No Known Drug Allergies  Past History:  Family History: Last updated: 08/24/2007 Family History of Stroke M 1st degree relative <50 Family History Lung cancer  Social History: Last updated: 08/14/2008 Former Smoker Single--no children Retired--paper products Alcohol Use - no  Risk Factors: Smoking Status: quit (03/13/2010) Packs/Day: 1.0  (01/09/2010) Passive Smoke Exposure: no (03/13/2010)  Past medical, surgical, family and social histories (including risk factors) reviewed, and no changes noted (except as noted below).  Past Medical History: Reviewed history from 08/14/2008 and no changes required. Osteoarthritis Hypertension Gastroesophageal reflux disease New diagnosis of nonischemic cardiomyopathy Arthritis Cerebral aneurysm status post clipping Sinus bradycardia Paroxysmal atrial fibrillation Cardiomyopathy  Past Surgical History: Reviewed history from 02/04/2007 and no changes required. EDG-12/26/2005 Aurysm with clips Rotator cuff repair left Fracture arm left childhod Tonsillectomy Inguinal herniorrhaphy bilateral tendon repair left  Family History: Reviewed history from 08/24/2007 and no changes required. Family History of Stroke M 1st degree relative <50 Family History Lung cancer  Social History: Reviewed history from 08/14/2008 and no changes required. Former Smoker Single--no children Retired--paper products Alcohol Use - no  Review  of Systems  The patient denies anorexia, fever, weight loss, weight gain, vision loss, decreased hearing, hoarseness, chest pain, syncope, dyspnea on exertion, peripheral edema, prolonged cough, headaches, hemoptysis, abdominal pain, melena, hematochezia, severe indigestion/heartburn, hematuria, incontinence, genital sores, muscle weakness, suspicious skin lesions, transient blindness, difficulty walking, depression, unusual weight change, abnormal bleeding, enlarged lymph nodes, angioedema, breast masses, and testicular masses.    Physical Exam  General:  alert and well-developed.   Head:  normocephalic and atraumatic.   Eyes:  pupils equal and pupils round.   Ears:  R ear normal and L ear normal.   Nose:  no external deformity and no nasal discharge.   Mouth:  pharynx pink and moist.  good dentition.   Neck:  No deformities, masses, or tenderness  noted. Lungs:  Normal respiratory effort, chest expands symmetrically. Lungs are clear to auscultation, no crackles or wheezes. Heart:  Normal rate and regular rhythm. S1 and S2 normal without gallop, murmur, click, rub or other extra sounds. Abdomen:  Bowel sounds positive,abdomen soft and non-tender without masses, organomegaly or hernias noted. Msk:  No deformity or scoliosis noted of thoracic or lumbar spine.   Extremities:  No clubbing, cyanosis, edema, or deformity noted with normal full range of motion of all joints.   Neurologic:  No cranial nerve deficits noted. Station and gait are normal. Plantar reflexes are down-going bilaterally. DTRs are symmetrical throughout. Sensory, motor and coordinative functions appear intact.   Impression & Recommendations:  Problem # 1:  ? of SYNDROME OF INAPPROPRIATE ANTIDIURETIC HORMONE SECRETION (ICD-253.6)  Orders: T- * Misc. Laboratory test [phone]) Radiology Referral (Radiology) TLB-Sodium (Na+) ([id number])  Problem # 2:  HYPONATREMIA (ICD-276.1) ct to r/o parneoplastic syndrome Orders: Radiology Referral (Radiology)  Problem # 3:  ATRIAL FIBRILLATION (ICD-427.31)  His updated medication list for this problem includes:    Warfarin Sodium 5 Mg Tabs (Warfarin sodium) ..... Use as directed by anticoagulation clinic    Amlodipine Besylate 5 Mg Tabs (Amlodipine besylate) .Marland Kitchen... Take one tablet once daily    Propafenone Hcl 225 Mg Tabs (Propafenone hcl) .Marland Kitchen... Take 1 and 1/2 tablets twice daily  Reviewed the following: PT: 20.1 (09/24/2009)   INR: 1.4 (02/18/2010) Coumadin Dose (weekly): N/A (04/18/2008) Prior Coumadin Dose (weekly): N/A (04/18/2008) Next Protime: 3 weeks (dated on 02/18/2010)  Problem # 4:  UNS ADVRS EFF UNS RX MEDICINAL&BIOLOGICAL SBSTNC (ICD-995.20)  Complete Medication List: 1)  Omeprazole 20 Mg Cpdr (Omeprazole) .... Once daily 2)  Meloxicam 15 Mg Tabs (Meloxicam) .... Once daily 3)  Glucosamine Relief 1000 Mg Tabs  (Glucosamine sulfate) .Marland Kitchen.. 1 two times a day 4)  B-100 Balanced Tr Tbcr (B complex-folic acid) .... Once daily 5)  Niacinamide 500 Mg Tabs (Niacinamide) .... 2 tablets once daily 6)  Finasteride 5 Mg Tabs (Finasteride) .... 1/4 tab once daily 7)  Milk Thistle 500 Mg Caps (Milk thistle) .... 2 once daily 8)  Ra Flax Seed Oil 1000 1000 Mg Caps (Flaxseed (linseed)) .... Three times a day 9)  Micardis 40 Mg Tabs (Telmisartan) .... One by mouth daily 10)  Warfarin Sodium 5 Mg Tabs (Warfarin sodium) .... Use as directed by anticoagulation clinic 11)  Furosemide 20 Mg Tabs (Furosemide) .... One by mouth daily 12)  Amlodipine Besylate 5 Mg Tabs (Amlodipine besylate) .... Take one tablet once daily 13)  Propafenone Hcl 225 Mg Tabs (Propafenone hcl) .... Take 1 and 1/2 tablets twice daily 14)  Alprazolam 0.5 Mg Tabs (Alprazolam) .... One by mouth q 6 hour prn  15)  Doxepin Hcl 10 Mg Caps (Doxepin hcl) .Marland Kitchen.. 1 at bedtime  Other Orders: Flu Vaccine 64yrs + MEDICARE PATIENTS (Z6109) Administration Flu vaccine - MCR (U0454)  Hypertension Assessment/Plan:      The patient's hypertensive risk group is category B: At least one risk factor (excluding diabetes) with no target organ damage.  Today's blood pressure is 120/80.  His blood pressure goal is < 140/90.  Patient Instructions: 1)  Please schedule a follow-up appointment in 1 month. 2)  BMP prior to visit, ICD-9:253.6 Prescriptions: FUROSEMIDE 20 MG TABS (FUROSEMIDE) one by mouth daily  #30 x 11   Entered and Authorized by:   Stacie Glaze MD   Signed by:   Stacie Glaze MD on 03/13/2010   Method used:   [facility]        [facility] [address] [phone]* (retail)       [address]       [place], [place]  [address]       Ph: [phone] or [phone]       Fax: [phone]   RxID:   [id number]    Orders Added: 1)  Flu Vaccine 78yrs + MEDICARE PATIENTS [Q2039] 2)  Administration Flu vaccine - MCR [G0008] 3)   T- * Misc. Laboratory test [99999] 4)  Est. Patient Level IV [25956] 5)  Radiology Referral [Radiology] 6)  TLB-Sodium (Na+) [[id number]]

## 2010-06-25 NOTE — Assessment & Plan Note (Signed)
Summary: 1 month fup//ccm   Vital Signs:  Patient profile:   70 year old male Height:      68 inches Weight:      182 pounds BMI:     27.77 Temp:     98.2 degrees F oral Pulse rate:   72 / minute Resp:     14 per minute BP sitting:   134 / 80  (left arm)  Vitals Entered By: Willy Eddy, LPN (April 17, 2010 10:46 AM) CC: roa labs, Hypertension Management Is Patient Diabetic? No   Primary Care Provider:  Peri Jefferson  CC:  roa labs and Hypertension Management.  History of Present Illness: follow up of low NA and possible SIADH pt now believes he "drinks too much water" becuse of his chronic dry mouth   Hypertension History:      He denies headache, chest pain, palpitations, dyspnea with exertion, orthopnea, PND, peripheral edema, visual symptoms, neurologic problems, syncope, and side effects from treatment.        Positive major cardiovascular risk factors include male age 89 years old or older and hypertension.  Negative major cardiovascular risk factors include no history of diabetes or hyperlipidemia, negative family history for ischemic heart disease, and non-tobacco-user status.        Further assessment for target organ damage reveals no history of ASHD, cardiac end-organ damage (CHF/LVH), stroke/TIA, peripheral vascular disease, renal insufficiency, or hypertensive retinopathy.     Preventive Screening-Counseling & Management  Alcohol-Tobacco     Smoking Status: quit     Packs/Day: 1.0     Year Started: 1968     Year Quit: 1988     Passive Smoke Exposure: no     Tobacco Counseling: to remain off tobacco products  Problems Prior to Update: 1)  ? of Syndrome of Inappropriate Antidiuretic Hormone Secretion  (ICD-253.6) 2)  Dry Mouth  (ICD-527.7) 3)  Abnormal Electrocardiogram  (ICD-794.31) 4)  Dry Mouth  (ICD-527.7) 5)  Hyponatremia  (ICD-276.1) 6)  Ing Hern w/o Mention Obst/gangren Unilat/unspec  (ICD-550.90) 7)  Carotid Bruit  (ICD-785.9) 8)   Atrial Fibrillation  (ICD-427.31) 9)  Cardiomyopathy Tachycardia- Resolved  (ICD-425.9) 10)  Coumadin Therapy  (ICD-V58.61) 11)  Hyperkalemia  (ICD-276.7) 12)  Transient Disorder Initiating/maintaining Sleep  (ICD-307.41) 13)  Hypertension  (ICD-401.9) 14)  Ganglion Cyst, Wrist, Left  (ICD-727.41) 15)  Gerd  (ICD-530.81) 16)  Encounter For Therapeutic Drug Monitoring  (ICD-V58.83) 17)  Uns Advrs Eff Uns Rx Medicinal&biological Sbstnc  (ICD-995.20) 18)  Ganglion Cyst, Wrist, Left  (ICD-727.41) 19)  Osteoarthritis  (ICD-715.90)  Current Problems (verified): 1)  ? of Syndrome of Inappropriate Antidiuretic Hormone Secretion  (ICD-253.6) 2)  Dry Mouth  (ICD-527.7) 3)  Abnormal Electrocardiogram  (ICD-794.31) 4)  Dry Mouth  (ICD-527.7) 5)  Hyponatremia  (ICD-276.1) 6)  Ing Hern w/o Mention Obst/gangren Unilat/unspec  (ICD-550.90) 7)  Carotid Bruit  (ICD-785.9) 8)  Atrial Fibrillation  (ICD-427.31) 9)  Cardiomyopathy Tachycardia- Resolved  (ICD-425.9) 10)  Coumadin Therapy  (ICD-V58.61) 11)  Hyperkalemia  (ICD-276.7) 12)  Transient Disorder Initiating/maintaining Sleep  (ICD-307.41) 13)  Hypertension  (ICD-401.9) 14)  Ganglion Cyst, Wrist, Left  (ICD-727.41) 15)  Gerd  (ICD-530.81) 16)  Encounter For Therapeutic Drug Monitoring  (ICD-V58.83) 17)  Uns Advrs Eff Uns Rx Medicinal&biological Sbstnc  (ICD-995.20) 18)  Ganglion Cyst, Wrist, Left  (ICD-727.41) 19)  Osteoarthritis  (ICD-715.90)  Medications Prior to Update: 1)  Omeprazole 20 Mg  Cpdr (Omeprazole) .... Once Daily 2)  Meloxicam 15  Mg Tabs (Meloxicam) .... Once Daily 3)  Glucosamine Relief 1000 Mg Tabs (Glucosamine Sulfate) .Marland Kitchen.. 1 Two Times A Day 4)  B-100 Balanced Tr   Tbcr (B Complex-Folic Acid) .... Once Daily 5)  Niacinamide 500 Mg Tabs (Niacinamide) .... 2 Tablets Once Daily 6)  Finasteride 5 Mg Tabs (Finasteride) .... 1/4 Tab Once Daily 7)  Milk Thistle 500 Mg  Caps (Milk Thistle) .... 2 Once Daily 8)  Ra Flax Seed Oil  1000 1000 Mg  Caps (Flaxseed (Linseed)) .... Three Times A Day 9)  Micardis 40 Mg Tabs (Telmisartan) .... One By Mouth Daily 10)  Warfarin Sodium 5 Mg Tabs (Warfarin Sodium) .... Use As Directed By Anticoagulation Clinic 11)  Furosemide 20 Mg Tabs (Furosemide) .... One By Mouth Daily 12)  Amlodipine Besylate 5 Mg Tabs (Amlodipine Besylate) .... Take One Tablet Once Daily 13)  Propafenone Hcl 225 Mg Tabs (Propafenone Hcl) .... Take 1 and 1/2 Tablets Twice Daily 14)  Alprazolam 0.5 Mg Tabs (Alprazolam) .... One By Mouth Q 6 Hour Prn 15)  Doxepin Hcl 10 Mg Caps (Doxepin Hcl) .Marland Kitchen.. 1 At Bedtime  Current Medications (verified): 1)  Omeprazole 20 Mg  Cpdr (Omeprazole) .... Once Daily 2)  Meloxicam 15 Mg Tabs (Meloxicam) .... Once Daily 3)  Glucosamine Relief 1000 Mg Tabs (Glucosamine Sulfate) .Marland Kitchen.. 1 Two Times A Day 4)  B-100 Balanced Tr   Tbcr (B Complex-Folic Acid) .... Once Daily 5)  Niacinamide 500 Mg Tabs (Niacinamide) .... 2 Tablets Once Daily 6)  Finasteride 5 Mg Tabs (Finasteride) .... 1/4 Tab Once Daily 7)  Milk Thistle 500 Mg  Caps (Milk Thistle) .... 2 Once Daily 8)  Ra Flax Seed Oil 1000 1000 Mg  Caps (Flaxseed (Linseed)) .... Three Times A Day 9)  Micardis 40 Mg Tabs (Telmisartan) .... One By Mouth Daily 10)  Warfarin Sodium 5 Mg Tabs (Warfarin Sodium) .... Use As Directed By Anticoagulation Clinic 11)  Furosemide 20 Mg Tabs (Furosemide) .... One By Mouth Daily 12)  Amlodipine Besylate 5 Mg Tabs (Amlodipine Besylate) .... Take One Tablet Once Daily 13)  Propafenone Hcl 225 Mg Tabs (Propafenone Hcl) .... Take 1 and 1/2 Tablets Twice Daily 14)  Alprazolam 0.5 Mg Tabs (Alprazolam) .... One By Mouth Q 6 Hour Prn 15)  Doxepin Hcl 10 Mg Caps (Doxepin Hcl) .Marland Kitchen.. 1 At Bedtime  Allergies (verified): No Known Drug Allergies  Past History:  Family History: Last updated: 08/24/2007 Family History of Stroke M 1st degree relative <50 Family History Lung cancer  Social History: Last  updated: 08/14/2008 Former Smoker Single--no children Retired--paper products Alcohol Use - no  Risk Factors: Smoking Status: quit (04/17/2010) Packs/Day: 1.0 (04/17/2010) Passive Smoke Exposure: no (04/17/2010)  Past medical, surgical, family and social histories (including risk factors) reviewed, and no changes noted (except as noted below).  Past Medical History: Reviewed history from 08/14/2008 and no changes required. Osteoarthritis Hypertension Gastroesophageal reflux disease New diagnosis of nonischemic cardiomyopathy Arthritis Cerebral aneurysm status post clipping Sinus bradycardia Paroxysmal atrial fibrillation Cardiomyopathy  Past Surgical History: Reviewed history from 02/04/2007 and no changes required. EDG-12/26/2005 Aurysm with clips Rotator cuff repair left Fracture arm left childhod Tonsillectomy Inguinal herniorrhaphy bilateral tendon repair left  Family History: Reviewed history from 08/24/2007 and no changes required. Family History of Stroke M 1st degree relative <50 Family History Lung cancer  Social History: Reviewed history from 08/14/2008 and no changes required. Former Smoker Single--no children Retired--paper products Alcohol Use - no  Review of Systems  The patient  denies anorexia, fever, weight loss, weight gain, vision loss, decreased hearing, hoarseness, chest pain, syncope, dyspnea on exertion, peripheral edema, prolonged cough, headaches, hemoptysis, abdominal pain, melena, hematochezia, severe indigestion/heartburn, hematuria, incontinence, genital sores, muscle weakness, suspicious skin lesions, transient blindness, difficulty walking, depression, unusual weight change, abnormal bleeding, enlarged lymph nodes, angioedema, and breast masses.    Physical Exam  General:  alert and well-developed.   Head:  normocephalic and atraumatic.   Eyes:  pupils equal and pupils round.   Ears:  R ear normal and L ear normal.   Mouth:  pharynx  pink and moist.  good dentition.   Neck:  No deformities, masses, or tenderness noted. Lungs:  Normal respiratory effort, chest expands symmetrically. Lungs are clear to auscultation, no crackles or wheezes. Heart:  Normal rate and regular rhythm. S1 and S2 normal without gallop, murmur, click, rub or other extra sounds. Abdomen:  Bowel sounds positive,abdomen soft and non-tender without masses, organomegaly or hernias noted. Msk:  No deformity or scoliosis noted of thoracic or lumbar spine.   Neurologic:  No cranial nerve deficits noted. Station and gait are normal. Plantar reflexes are down-going bilaterally. DTRs are symmetrical throughout. Sensory, motor and coordinative functions appear intact.   Impression & Recommendations:  Problem # 1:  ? of SYNDROME OF INAPPROPRIATE ANTIDIURETIC HORMONE SECRETION (ICD-253.6) screning negative so far  Problem # 2:  HYPONATREMIA (ICD-276.1) increased  na limited fluid intake water intoxication best diagnosis  Problem # 3:  DRY MOUTH (ICD-527.7) contributed to number 2  Problem # 4:  HYPERTENSION (ICD-401.9)  His updated medication list for this problem includes:    Micardis 40 Mg Tabs (Telmisartan) ..... One by mouth daily    Furosemide 20 Mg Tabs (Furosemide) ..... One by mouth daily    Amlodipine Besylate 5 Mg Tabs (Amlodipine besylate) .Marland Kitchen... Take one tablet once daily  BP today: 134/80 Prior BP: 120/80 (03/13/2010)  Prior 10 Yr Risk Heart Disease: Not enough information (08/24/2007)  Labs Reviewed: K+: 4.4 (04/10/2010) Creat: : 0.8 (04/10/2010)   Chol: 218 (04/05/2009)   HDL: 74.00 (04/05/2009)   LDL: DEL (08/24/2007)   TG: 53.0 (04/05/2009)  Complete Medication List: 1)  Omeprazole 20 Mg Cpdr (Omeprazole) .... Once daily 2)  Meloxicam 15 Mg Tabs (Meloxicam) .... Once daily 3)  Glucosamine Relief 1000 Mg Tabs (Glucosamine sulfate) .Marland Kitchen.. 1 two times a day 4)  B-100 Balanced Tr Tbcr (B complex-folic acid) .... Once daily 5)   Niacinamide 500 Mg Tabs (Niacinamide) .... 2 tablets once daily 6)  Finasteride 5 Mg Tabs (Finasteride) .... 1/4 tab once daily 7)  Milk Thistle 500 Mg Caps (Milk thistle) .... 2 once daily 8)  Ra Flax Seed Oil 1000 1000 Mg Caps (Flaxseed (linseed)) .... Three times a day 9)  Micardis 40 Mg Tabs (Telmisartan) .... One by mouth daily 10)  Warfarin Sodium 5 Mg Tabs (Warfarin sodium) .... Use as directed by anticoagulation clinic 11)  Furosemide 20 Mg Tabs (Furosemide) .... One by mouth daily 12)  Amlodipine Besylate 5 Mg Tabs (Amlodipine besylate) .... Take one tablet once daily 13)  Propafenone Hcl 225 Mg Tabs (Propafenone hcl) .... Take 1 and 1/2 tablets twice daily 14)  Alprazolam 0.5 Mg Tabs (Alprazolam) .... One by mouth q 6 hour prn 15)  Doxepin Hcl 10 Mg Caps (Doxepin hcl) .Marland Kitchen.. 1 at bedtime  Hypertension Assessment/Plan:      The patient's hypertensive risk group is category B: At least one risk factor (excluding diabetes) with no target  organ damage.  Today's blood pressure is 134/80.  His blood pressure goal is < 140/90.  Patient Instructions: 1)  watch volume of liguids limit to 1.5 liters a day 2)  Please schedule a follow-up appointment in 4 months. 3)  BMP prior to visit, ICD-9:995.20   Orders Added: 1)  Est. Patient Level IV [04540]

## 2010-06-25 NOTE — Assessment & Plan Note (Signed)
Summary: 2 MONTH ROV/NJR   Vital Signs:  Patient profile:   70 year old [name] Height:      68 inches Weight:      184 pounds BMI:     28.08 Temp:     98.3 degrees F oral Pulse rate:   56 / minute Resp:     14 per minute BP sitting:   120 / 80  (left arm)  Vitals Entered By: Willy Eddy, LPN (Oct 11, 2009 10:Travis AM) CC: roa labs- new med by cardiologist--propafenone, Hypertension Management   Primary Care Provider:  Peri Jefferson  CC:  roa labs- new med by cardiologist--propafenone and Hypertension Management.  History of Present Illness: follow up of AF and HTN on coumadin GERD hx with stable symptoms reveiwed possiible GI symptosm of dark stools or abdominal pain  Hypertension History:      He denies headache, chest pain, palpitations, dyspnea with exertion, orthopnea, PND, peripheral edema, visual symptoms, neurologic problems, syncope, and side effects from treatment.        Positive major cardiovascular risk factors include [name] age 74 years old or older and hypertension.  Negative major cardiovascular risk factors include no history of diabetes or hyperlipidemia, negative family history for ischemic heart disease, and non-tobacco-user status.        Further assessment for target organ damage reveals no history of ASHD, cardiac end-organ damage (CHF/LVH), stroke/TIA, peripheral vascular disease, renal insufficiency, or hypertensive retinopathy.     Preventive Screening-Counseling & Management  Alcohol-Tobacco     Smoking Status: quit     Packs/Day: 1.0     Year Started: 1968     Year Quit: 1988     Passive Smoke Exposure: no  Problems Prior to Update: 1)  Hyponatremia  (ICD-276.1) 2)  Ing Hern w/o Mention Obst/gangren Unilat/unspec  (ICD-550.90) 3)  Carotid Bruit  (ICD-785.9) 4)  Atrial Fibrillation  (ICD-427.31) 5)  Cardiomyopathy Tachycardia- Resolved  (ICD-425.9) 6)  Coumadin Therapy  (ICD-V58.61) 7)  Hyperkalemia  (ICD-276.7) 8)  Transient Disorder  Initiating/maintaining Sleep  (ICD-307.41) 9)  Hypertension  (ICD-401.9) 10)  Ganglion Cyst, Wrist, Left  (ICD-727.41) 11)  Gerd  (ICD-530.81) 12)  Encounter For Therapeutic Drug Monitoring  (ICD-V58.83) 13)  Uns Advrs Eff Uns Rx Medicinal&biological Sbstnc  (ICD-995.20) 14)  Ganglion Cyst, Wrist, Left  (ICD-727.41) 15)  Osteoarthritis  (ICD-715.90)  Current Problems (verified): 1)  Hyponatremia  (ICD-276.1) 2)  Ing Hern w/o Mention Obst/gangren Unilat/unspec  (ICD-550.90) 3)  Carotid Bruit  (ICD-785.9) 4)  Atrial Fibrillation  (ICD-427.31) 5)  Cardiomyopathy Tachycardia- Resolved  (ICD-425.9) 6)  Coumadin Therapy  (ICD-V58.61) 7)  Hyperkalemia  (ICD-276.7) 8)  Transient Disorder Initiating/maintaining Sleep  (ICD-307.41) 9)  Hypertension  (ICD-401.9) 10)  Ganglion Cyst, Wrist, Left  (ICD-727.41) 11)  Gerd  (ICD-530.81) 12)  Encounter For Therapeutic Drug Monitoring  (ICD-V58.83) 13)  Uns Advrs Eff Uns Rx Medicinal&biological Sbstnc  (ICD-995.20) 14)  Ganglion Cyst, Wrist, Left  (ICD-727.41) 15)  Osteoarthritis  (ICD-715.90)  Medications Prior to Update: 1)  Meloxicam 15 Mg Tabs (Meloxicam) .... Once Daily 2)  Glucosamine Relief 1000 Mg Tabs (Glucosamine Sulfate) .Marland Kitchen.. 1 Two Times A Day 3)  B-100 Balanced Tr   Tbcr (B Complex-Folic Acid) .... Once Daily 4)  Niacinamide 500 Mg Tabs (Niacinamide) .... 2 Tablets Once Daily 5)  Finasteride 5 Mg Tabs (Finasteride) .... 1/4 Tab Once Daily 6)  Milk Thistle 500 Mg  Caps (Milk Thistle) .... 2 Once Daily 7)  Omeprazole 20 Mg  Cpdr (Omeprazole) .... Once Daily 8)  Ra Flax Seed Oil 1000 1000 Mg  Caps (Flaxseed (Linseed)) .... Three Times A Day 9)  Bisoprolol Fumarate 5 Mg Tabs (Bisoprolol Fumarate) .... 1/2 Tablet By Mouth Daily 10)  Warfarin Sodium 5 Mg Tabs (Warfarin Sodium) .... Use As Directed By Anticoagulation Clinic 11)  Micardis 80 Mg Tabs (Telmisartan) .Marland Kitchen.. 1 Once Daily 12)  Furosemide 20 Mg  Tabs (Furosemide) .Marland Kitchen.. 1 Once Daily 13)   Amlodipine Besylate 5 Mg Tabs (Amlodipine Besylate) .... Take One Tablet Once Daily 14)  Propafenone Hcl 225 Mg Tabs (Propafenone Hcl) .... Take 1 and 1/2 Tablets Twice Daily 15)  Alprazolam 0.5 Mg Tabs (Alprazolam) .... One By Mouth Q 6 Hour Prn 16)  Silenor 6 Mg Tabs (Doxepin Hcl) .... Once Daily  Current Medications (verified): 1)  Meloxicam 15 Mg Tabs (Meloxicam) .... Once Daily 2)  Glucosamine Relief 1000 Mg Tabs (Glucosamine Sulfate) .Marland Kitchen.. 1 Two Times A Day 3)  B-100 Balanced Tr   Tbcr (B Complex-Folic Acid) .... Once Daily 4)  Niacinamide 500 Mg Tabs (Niacinamide) .... 2 Tablets Once Daily 5)  Finasteride 5 Mg Tabs (Finasteride) .... 1/4 Tab Once Daily 6)  Milk Thistle 500 Mg  Caps (Milk Thistle) .... 2 Once Daily 7)  Omeprazole 20 Mg  Cpdr (Omeprazole) .... Once Daily 8)  Ra Flax Seed Oil 1000 1000 Mg  Caps (Flaxseed (Linseed)) .... Three Times A Day 9)  Bisoprolol Fumarate 5 Mg Tabs (Bisoprolol Fumarate) .... 1/2 Tablet By Mouth Daily 10)  Warfarin Sodium 5 Mg Tabs (Warfarin Sodium) .... Use As Directed By Anticoagulation Clinic 11)  Micardis 40 Mg Tabs (Telmisartan) .... One By Mouth Daily 12)  Furosemide 20 Mg  Tabs (Furosemide) .Marland Kitchen.. 1 Once Daily 13)  Amlodipine Besylate 5 Mg Tabs (Amlodipine Besylate) .... Take One Tablet Once Daily 14)  Propafenone Hcl 225 Mg Tabs (Propafenone Hcl) .... Take 1 and 1/2 Tablets Twice Daily 15)  Alprazolam 0.5 Mg Tabs (Alprazolam) .... One By Mouth Q 6 Hour Prn 16)  Doxepin Hcl 10 Mg Caps (Doxepin Hcl) .Marland Kitchen.. 1 At Bedtime  Allergies (verified): No Known Drug Allergies  Past History:  Family History: Last updated: 08/24/2007 Family History of Stroke M 1st degree relative <50 Family History Lung cancer  Social History: Last updated: 08/14/2008 Former Smoker Single--no children Retired--paper products Alcohol Use - no  Risk Factors: Smoking Status: quit (10/11/2009) Packs/Day: 1.0 (10/11/2009) Passive Smoke Exposure: no  (10/11/2009)  Past medical, surgical, family and social histories (including risk factors) reviewed, and no changes noted (except as noted below).  Past Medical History: Reviewed history from 08/14/2008 and no changes required. Osteoarthritis Hypertension Gastroesophageal reflux disease New diagnosis of nonischemic cardiomyopathy Arthritis Cerebral aneurysm status post clipping Sinus bradycardia Paroxysmal atrial fibrillation Cardiomyopathy  Past Surgical History: Reviewed history from 02/04/2007 and no changes required. EDG-12/26/2005 Aurysm with clips Rotator cuff repair left Fracture arm left childhod Tonsillectomy Inguinal herniorrhaphy bilateral tendon repair left  Family History: Reviewed history from 08/24/2007 and no changes required. Family History of Stroke M 1st degree relative <50 Family History Lung cancer  Social History: Reviewed history from 08/14/2008 and no changes required. 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         feels  Physical Exam  General:  alert and well-developed.   Head:  normocephalic and atraumatic.   Eyes:  pupils equal and pupils round.   Ears:  R ear normal and L ear normal.   Nose:  no external deformity and no nasal discharge.   Neck:  No deformities, masses, or tenderness noted. Lungs:  Normal respiratory effort, chest expands symmetrically. Lungs are clear  to auscultation, no crackles or wheezes. Heart:  Normal rate and regular rhythm. S1 and S2 normal without gallop, murmur, click, rub or other extra sounds. Abdomen:  Bowel sounds positive,abdomen soft and non-tender without masses, organomegaly or hernias noted. Rectal:  No external abnormalities noted. Normal sphincter tone. No rectal masses or tenderness. Genitalia:  Testes bilaterally descended without nodularity, tenderness or masses. No scrotal masses or lesions. No penis lesions or urethral discharge. Prostate:  Prostate gland firm and smooth, no enlargement, nodularity, tenderness, mass, asymmetry or induration. Msk:  No deformity or scoliosis noted of thoracic or lumbar spine.   Pulses:  R and L carotid,radial,femoral,dorsalis pedis and posterior tibial pulses are full and equal bilaterally Extremities:  No clubbing, cyanosis, edema, or deformity noted with normal full range of motion of all joints.   Neurologic:  No cranial nerve deficits noted. Station and gait are normal. Plantar reflexes are down-going bilaterally. DTRs are symmetrical throughout. Sensory, motor and coordinative functions appear intact. Skin:  Intact without suspicious lesions or rashes Cervical Nodes:  No lymphadenopathy noted Axillary Nodes:  No palpable lymphadenopathy Psych:  Oriented X3 and memory intact for recent and remote.     Impression & Recommendations:  Problem # 1:  HYPONATREMIA (ICD-276.1) the pt dose not have risks of water intoxication not SIADH quit smoking in 1989 moniter na effect of medications reviewed  Problem # 2:  HYPERTENSION (ICD-401.9) Assessment: Improved  His updated medication list for this problem includes:    Bisoprolol Fumarate 5 Mg Tabs (Bisoprolol fumarate) .Marland Kitchen... 1/2 tablet by mouth daily    Micardis 40 Mg Tabs (Telmisartan) ..... One by mouth daily    Furosemide 20 Mg Tabs (Furosemide) .Marland Kitchen... 1 once daily    Amlodipine Besylate 5 Mg Tabs (Amlodipine besylate) .Marland Kitchen... Take  one tablet once daily  BP today: 120/80 Prior BP: 122/84 (08/28/2009)  Prior 10 Yr Risk Heart Disease: Not enough information (08/24/2007)  Labs Reviewed: K+: 4.4 (10/03/2009) Creat: : 0.7 (10/03/2009)   Chol: 218 (04/05/2009)   HDL: 74.00 (04/05/2009)   LDL: DEL (08/24/2007)   TG: 53.0 (04/05/2009)  Problem # 3:  ATRIAL FIBRILLATION (ICD-427.31) Assessment: Improved  His updated medication list for this problem includes:    Bisoprolol Fumarate 5 Mg Tabs (Bisoprolol fumarate) .Marland Kitchen... 1/2 tablet by mouth daily    Warfarin Sodium 5 Mg Tabs (Warfarin sodium) ..... Use as directed by anticoagulation clinic    Amlodipine Besylate 5 Mg Tabs (Amlodipine besylate) .Marland Kitchen... Take one tablet once daily    Propafenone Hcl 225 Mg Tabs (Propafenone hcl) .Marland Kitchen... Take 1 and 1/2 tablets twice daily  Problem # 4:  COUMADIN THERAPY (ICD-V58.61) PT montered  Complete Medication List: 1)  Meloxicam 15 Mg Tabs (Meloxicam) .... Once daily 2)  Glucosamine Relief 1000 Mg Tabs (Glucosamine sulfate) .Marland Kitchen.. 1 two times a day 3)  B-100 Balanced Tr Tbcr (B complex-folic acid) .... Once daily 4)  Niacinamide 500 Mg Tabs (Niacinamide) .... 2  tablets once daily 5)  Finasteride 5 Mg Tabs (Finasteride) .... 1/4 tab once daily 6)  Milk Thistle 500 Mg Caps (Milk thistle) .... 2 once daily 7)  Omeprazole 20 Mg Cpdr (Omeprazole) .... Once daily 8)  Ra Flax Seed Oil 1000 1000 Mg Caps (Flaxseed (linseed)) .... Three times a day 9)  Bisoprolol Fumarate 5 Mg Tabs (Bisoprolol fumarate) .... 1/2 tablet by mouth daily 10)  Warfarin Sodium 5 Mg Tabs (Warfarin sodium) .... Use as directed by anticoagulation clinic 11)  Micardis 40 Mg Tabs (Telmisartan) .... One by mouth daily 12)  Furosemide 20 Mg Tabs (Furosemide) .Marland Kitchen.. 1 once daily 13)  Amlodipine Besylate 5 Mg Tabs (Amlodipine besylate) .... Take one tablet once daily 14)  Propafenone Hcl 225 Mg Tabs (Propafenone hcl) .... Take 1 and 1/2 tablets twice daily 15)  Alprazolam 0.5 Mg  Tabs (Alprazolam) .... One by mouth q 6 hour prn 16)  Doxepin Hcl 10 Mg Caps (Doxepin hcl) .Marland Kitchen.. 1 at bedtime  Hypertension Assessment/Plan:      The patient's hypertensive risk group is category B: At least one risk factor (excluding diabetes) with no target organ damage.  Today's blood pressure is 120/80.  His blood pressure goal is < 140/90.  Patient Instructions: 1)  Please schedule a follow-up appointment in 2 months. 2)  BMP prior to visit, ICD-9:401.9

## 2010-06-25 NOTE — Progress Notes (Signed)
Summary: Pt req script for Silenor to [facility]  Phone Note Call from Patient Call back at Home Phone [phone]   Caller: Patient Summary of Call: Pt called req Silenor script called in to [facility] Initial call taken by: Lucy Antigua,  August 27, 2009 10:16 AM    Prescriptions: SILENOR 6 MG TABS (DOXEPIN HCL) one by mouth  at bedtime  #30 x 6   Entered by:   Willy Eddy, LPN   Authorized by:   Stacie Glaze MD   Signed by:   Willy Eddy, LPN on 09/81/1914   Method used:   [facility]        [facility] [address] [phone][facility] (retail)       [address]       [place], [place]  [address]       Ph: [phone] or [phone]       Fax: [phone]   RxID:   [id number]

## 2010-06-25 NOTE — Assessment & Plan Note (Signed)
Summary: per checko ut/sf  Medications Added SILENOR 6 MG TABS (DOXEPIN HCL) once daily      Allergies Added: NKDA  Primary Provider:  Peri Jefferson   History of Present Illness: Mr. Tallman is seen in followup for atrial fibrillation.  He has been maintained on Rythmol 225 twice daily. Early January he had recurrent atrial fibrillation prompting cardioversion. The next day he returned with a sense of impending doom those thoughts be related to either hypertension or anxiety. Cardiac enzymes were cycled the patient was discharged to home.  a carotid bruit was heard during the evaluation;  there was a 40-59% stenosis on the left and repeat scanning was recommended in one year  Current Medications (verified): 1)  Meloxicam 15 Mg Tabs (Meloxicam) .... Once Daily 2)  Glucosamine Relief 1000 Mg Tabs (Glucosamine Sulfate) .Marland Kitchen.. 1 Two Times A Day 3)  B-100 Balanced Tr   Tbcr (B Complex-Folic Acid) .... Once Daily 4)  Niacinamide 500 Mg Tabs (Niacinamide) .... 2 Tablets Once Daily 5)  Finasteride 5 Mg Tabs (Finasteride) .... 1/4 Tab Once Daily 6)  Milk Thistle 500 Mg  Caps (Milk Thistle) .... 2 Once Daily 7)  Omeprazole 20 Mg  Cpdr (Omeprazole) .... Once Daily 8)  Ra Flax Seed Oil 1000 1000 Mg  Caps (Flaxseed (Linseed)) .... Three Times A Day 9)  Bisoprolol Fumarate 5 Mg Tabs (Bisoprolol Fumarate) .... 1/2 Tablet By Mouth Daily 10)  Warfarin Sodium 5 Mg Tabs (Warfarin Sodium) .... Use As Directed By Anticoagulation Clinic 11)  Micardis 80 Mg Tabs (Telmisartan) .Marland Kitchen.. 1 Once Daily 12)  Furosemide 20 Mg  Tabs (Furosemide) .Marland Kitchen.. 1 Once Daily 13)  Amlodipine Besylate 5 Mg Tabs (Amlodipine Besylate) .... Take One Tablet Once Daily 14)  Propafenone Hcl 225 Mg Tabs (Propafenone Hcl) .Marland Kitchen.. 1 Two Times A Day 15)  Alprazolam 0.5 Mg Tabs (Alprazolam) .... One By Mouth Q 6 Hour Prn 16)  Silenor 6 Mg Tabs (Doxepin Hcl) .... Once Daily  Allergies (verified): No Known Drug Allergies  Past History:  Past  Medical History: Last updated: 08/14/2008 Osteoarthritis Hypertension Gastroesophageal reflux disease New diagnosis of nonischemic cardiomyopathy Arthritis Cerebral aneurysm status post clipping Sinus bradycardia Paroxysmal atrial fibrillation Cardiomyopathy  Past Surgical History: Last updated: 02/04/2007 EDG-12/26/2005 Aurysm with clips Rotator cuff repair left Fracture arm left childhod Tonsillectomy Inguinal herniorrhaphy bilateral tendon repair left  Family History: Last updated: 08/24/2007 Family History of Stroke M 1st degree relative <50 Family History Lung cancer  Social History: Last updated: 08/14/2008 Former Smoker Single--no children Retired--paper products Alcohol Use - no  Vital Signs:  Patient profile:   70 year old male Height:      68 inches Weight:      176 pounds BMI:     26.86 Pulse rate:   49 / minute Pulse rhythm:   regular BP sitting:   122 / 84  (left arm) Cuff size:   regular  Vitals Entered By: Judithe Modest CMA (August 28, 2009 3:13 PM)  Physical Exam  General:  The patient was alert and oriented in no acute distress. HEENT Normal.  Neck veins were flat, carotids were brisk. ( no bruit was appreciated) Lungs were clear.  Heart sounds were regular with 2/6 early systolic murmur Abdomen was soft with active bowel sounds. There is no clubbing cyanosis or edema. Skin Warm and dry    Impression & Recommendations:  Problem # 1:  CAROTID BRUIT (ICD-785.9) the patient has an asymptomatic carotid bruit. No plan repeat  evaluation in one year  Problem # 2:  ATRIAL FIBRILLATION (ICD-427.31) the patient has paroxysmal atrial fibrillation. We'll increase his Rythmol from 225 twice a day to one and a half tablets twice a day i.e. 337 b.i.d.    Bisoprolol Fumarate 5 Mg Tabs (Bisoprolol fumarate) .Marland Kitchen... 1/2 tablet by mouth daily    Warfarin Sodium 5 Mg Tabs (Warfarin sodium) ..... Use as directed by anticoagulation clinic    Propafenone Hcl  225 Mg Tabs (Propafenone hcl) .Marland Kitchen... 1 two times a day  Problem # 3:  CARDIOMYOPATHY TACHYCARDIA- RESOLVED (ICD-425.9) stable on current meds His updated medication list for this problem includes:    Bisoprolol Fumarate 5 Mg Tabs (Bisoprolol fumarate) .Marland Kitchen... 1/2 tablet by mouth daily    Warfarin Sodium 5 Mg Tabs (Warfarin sodium) ..... Use as directed by anticoagulation clinic    Micardis 80 Mg Tabs (Telmisartan) .Marland Kitchen... 1 once daily    Furosemide 20 Mg Tabs (Furosemide) .Marland Kitchen... 1 once daily    Amlodipine Besylate 5 Mg Tabs (Amlodipine besylate) .Marland Kitchen... Take one tablet once daily    Propafenone Hcl 225 Mg Tabs (Propafenone hcl) .Marland Kitchen... 1 two times a day  Patient Instructions: 1)  Your physician wants you to follow-up in: 8 months with Dr Graciela Husbands.  You will receive a reminder letter in the mail two months in advance. If you don't receive a letter, please call our office to schedule the follow-up appointment.

## 2010-06-25 NOTE — Medication Information (Signed)
Summary: Coumadin Clinic  Anticoagulant Therapy  Managed by: Bethena Midget, RN, BSN PCP: Peri Jefferson Supervising MD: Graciela Husbands MD, Viviann Spare Indication 1: Atrial fibrillation Lab Used: [facility] [place] Site: [address] INR POC 2.1 INR RANGE 2.0-3.0          Comments: Pt pending DCCV tomorrow.   Allergies: No Known Drug Allergies  Anticoagulation Management History:      The patient comes in today for his initial visit for anticoagulation therapy.  Positive risk factors for bleeding include an age of 2 years or older.  Negative risk factors for bleeding include no history of CVA/TIA.  The bleeding index is 'intermediate risk'.  Positive CHADS2 values include History of HTN.  Negative CHADS2 values include Age > 7 years old, History of Diabetes, and Prior Stroke/CVA/TIA.  His last INR was 4.4.  Anticoagulation responsible provider: Graciela Husbands MD, Viviann Spare.  INR POC: 2.1.  Cuvette Lot#: [id number].  Exp: 08/2010.    Anticoagulation Management Assessment/Plan:      The patient's current anticoagulation dose is Coumadin 5 mg  tabs: one by mouth daily or as directed.  The next INR is due 06/28/2009.  Anticoagulation instructions were given to patient.  Results were reviewed/authorized by Bethena Midget, RN, BSN.  He was notified by Bethena Midget, RN, BSN.         Current Anticoagulation Instructions: INR today 2.1 Checked per Dr. Graciela Husbands, pt. pending DCCV. Followed by another provider. Bethena Midget, RN, BSN  June 21, 2009 9:39 AM

## 2010-06-25 NOTE — Assessment & Plan Note (Signed)
Summary: 1 month rov/njr   Vital Signs:  Patient profile:   70 year old male Height:      68 inches Weight:      182 pounds BMI:     27.77 Temp:     98.2 degrees F oral Pulse rate:   72 / minute Resp:     14 per minute BP sitting:   140 / 80  (left arm)  Vitals Entered By: Willy Eddy, LPN (January 09, 2010 4:27 PM) CC: roa labs, Hypertension Management Is Patient Diabetic? No   Primary Care Provider:  Peri Jefferson  CC:  roa labs and Hypertension Management.  History of Present Illness: review of side effects from medication persistnatly low sodium  despire removal of ace and arb class drugs and no apparent hyper or hypo volemia possible side efect of the amiodarone  but renal function as reflected in creatinine is stable no apparent "water introxication"   Hypertension History:      He denies headache, chest pain, palpitations, dyspnea with exertion, orthopnea, PND, peripheral edema, visual symptoms, neurologic problems, syncope, and side effects from treatment.        Positive major cardiovascular risk factors include male age 74 years old or older and hypertension.  Negative major cardiovascular risk factors include no history of diabetes or hyperlipidemia, negative family history for ischemic heart disease, and non-tobacco-user status.        Further assessment for target organ damage reveals no history of ASHD, cardiac end-organ damage (CHF/LVH), stroke/TIA, peripheral vascular disease, renal insufficiency, or hypertensive retinopathy.     Preventive Screening-Counseling & Management  Alcohol-Tobacco     Smoking Status: quit     Packs/Day: 1.0     Year Started: 1968     Year Quit: 1988     Passive Smoke Exposure: no     Tobacco Counseling: to remain off tobacco products  Problems Prior to Update: 1)  Dry Mouth  (ICD-527.7) 2)  Abnormal Electrocardiogram  (ICD-794.31) 3)  Dry Mouth  (ICD-527.7) 4)  Hyponatremia  (ICD-276.1) 5)  Ing Hern w/o Mention  Obst/gangren Unilat/unspec  (ICD-550.90) 6)  Carotid Bruit  (ICD-785.9) 7)  Atrial Fibrillation  (ICD-427.31) 8)  Cardiomyopathy Tachycardia- Resolved  (ICD-425.9) 9)  Coumadin Therapy  (ICD-V58.61) 10)  Hyperkalemia  (ICD-276.7) 11)  Transient Disorder Initiating/maintaining Sleep  (ICD-307.41) 12)  Hypertension  (ICD-401.9) 13)  Ganglion Cyst, Wrist, Left  (ICD-727.41) 14)  Gerd  (ICD-530.81) 15)  Encounter For Therapeutic Drug Monitoring  (ICD-V58.83) 16)  Uns Advrs Eff Uns Rx Medicinal&biological Sbstnc  (ICD-995.20) 17)  Ganglion Cyst, Wrist, Left  (ICD-727.41) 18)  Osteoarthritis  (ICD-715.90)  Current Problems (verified): 1)  Dry Mouth  (ICD-527.7) 2)  Abnormal Electrocardiogram  (ICD-794.31) 3)  Dry Mouth  (ICD-527.7) 4)  Hyponatremia  (ICD-276.1) 5)  Ing Hern w/o Mention Obst/gangren Unilat/unspec  (ICD-550.90) 6)  Carotid Bruit  (ICD-785.9) 7)  Atrial Fibrillation  (ICD-427.31) 8)  Cardiomyopathy Tachycardia- Resolved  (ICD-425.9) 9)  Coumadin Therapy  (ICD-V58.61) 10)  Hyperkalemia  (ICD-276.7) 11)  Transient Disorder Initiating/maintaining Sleep  (ICD-307.41) 12)  Hypertension  (ICD-401.9) 13)  Ganglion Cyst, Wrist, Left  (ICD-727.41) 14)  Gerd  (ICD-530.81) 15)  Encounter For Therapeutic Drug Monitoring  (ICD-V58.83) 16)  Uns Advrs Eff Uns Rx Medicinal&biological Sbstnc  (ICD-995.20) 17)  Ganglion Cyst, Wrist, Left  (ICD-727.41) 18)  Osteoarthritis  (ICD-715.90)  Medications Prior to Update: 1)  Omeprazole 20 Mg  Cpdr (Omeprazole) .... Once Daily 2)  Meloxicam 15 Mg  Tabs (Meloxicam) .... Once Daily 3)  Glucosamine Relief 1000 Mg Tabs (Glucosamine Sulfate) .Marland Kitchen.. 1 Two Times A Day 4)  B-100 Balanced Tr   Tbcr (B Complex-Folic Acid) .... Once Daily 5)  Niacinamide 500 Mg Tabs (Niacinamide) .... 2 Tablets Once Daily 6)  Finasteride 5 Mg Tabs (Finasteride) .... 1/4 Tab Once Daily 7)  Milk Thistle 500 Mg  Caps (Milk Thistle) .... 2 Once Daily 8)  Ra Flax Seed Oil 1000  1000 Mg  Caps (Flaxseed (Linseed)) .... Three Times A Day 9)  Bystolic 5 Mg Tabs (Nebivolol Hcl) .... One By Mouth Daily 10)  Warfarin Sodium 5 Mg Tabs (Warfarin Sodium) .... Use As Directed By Anticoagulation Clinic 11)  Furosemide 20 Mg  Tabs (Furosemide) .Marland Kitchen.. 1 Once Daily 12)  Amlodipine Besylate 5 Mg Tabs (Amlodipine Besylate) .... Take One Tablet Once Daily 13)  Propafenone Hcl 225 Mg Tabs (Propafenone Hcl) .... Take 1 and 1/2 Tablets Twice Daily 14)  Alprazolam 0.5 Mg Tabs (Alprazolam) .... One By Mouth Q 6 Hour Prn 15)  Doxepin Hcl 10 Mg Caps (Doxepin Hcl) .Marland Kitchen.. 1 At Bedtime  Current Medications (verified): 1)  Omeprazole 20 Mg  Cpdr (Omeprazole) .... Once Daily 2)  Meloxicam 15 Mg Tabs (Meloxicam) .... Once Daily 3)  Glucosamine Relief 1000 Mg Tabs (Glucosamine Sulfate) .Marland Kitchen.. 1 Two Times A Day 4)  B-100 Balanced Tr   Tbcr (B Complex-Folic Acid) .... Once Daily 5)  Niacinamide 500 Mg Tabs (Niacinamide) .... 2 Tablets Once Daily 6)  Finasteride 5 Mg Tabs (Finasteride) .... 1/4 Tab Once Daily 7)  Milk Thistle 500 Mg  Caps (Milk Thistle) .... 2 Once Daily 8)  Ra Flax Seed Oil 1000 1000 Mg  Caps (Flaxseed (Linseed)) .... Three Times A Day 9)  Micardis 40 Mg Tabs (Telmisartan) .... One By Mouth Daily 10)  Warfarin Sodium 5 Mg Tabs (Warfarin Sodium) .... Use As Directed By Anticoagulation Clinic 11)  Chlorthalidone 25 Mg Tabs (Chlorthalidone) .... 1/2 By Mouth Daily Replaces The Furosemide 12)  Amlodipine Besylate 5 Mg Tabs (Amlodipine Besylate) .... Take One Tablet Once Daily 13)  Propafenone Hcl 225 Mg Tabs (Propafenone Hcl) .... Take 1 and 1/2 Tablets Twice Daily 14)  Alprazolam 0.5 Mg Tabs (Alprazolam) .... One By Mouth Q 6 Hour Prn 15)  Doxepin Hcl 10 Mg Caps (Doxepin Hcl) .Marland Kitchen.. 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  Past Medical History: Reviewed history from 08/14/2008 and no changes required. Osteoarthritis Hypertension Gastroesophageal reflux disease New diagnosis of nonischemic cardiomyopathy Arthritis Cerebral aneurysm status post clipping Sinus bradycardia Paroxysmal atrial fibrillation Cardiomyopathy  Past Surgical History: Reviewed history from 02/04/2007 and no changes required. EDG-12/26/2005 Aurysm with clips Rotator cuff repair left Fracture arm left childhod Tonsillectomy Inguinal herniorrhaphy bilateral tendon repair left  Family History: Reviewed history from 08/24/2007 and no changes required. Family History of Stroke M 1st degree relative <50 Family History Lung cancer  Social History: Reviewed history from 08/14/2008 and no changes required. 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    Physical Exam  General:  alert and well-developed.   Head:  normocephalic and atraumatic.   Eyes:  pupils equal and pupils round.   Ears:  R ear normal  and L ear normal.   Nose:  no external deformity and no nasal discharge.   Mouth:  pharynx pink and moist.  good dentition.   Neck:  No deformities, masses, or tenderness noted. Lungs:  Normal respiratory effort, chest expands symmetrically. Lungs are clear to auscultation, no crackles or wheezes. Heart:  Normal rate and regular rhythm. S1 and S2 normal without gallop, murmur, click, rub or other extra sounds. Abdomen:  Bowel sounds positive,abdomen soft and non-tender without masses, organomegaly or hernias noted. Neurologic:  No cranial nerve deficits noted. Station and gait are normal. Plantar reflexes are down-going bilaterally. DTRs are symmetrical throughout. Sensory, motor and coordinative functions appear intact.   Impression & Recommendations:  Problem # 1:  HYPONATREMIA (ICD-276.1) Assessment Unchanged the micardis change did not resolve the hyponaterima and the micardis was the best tolerated blood pressure medication will resume the micardis and librilize salt will change the lasix to chlorothalidone  Problem # 2:  HYPERTENSION (ICD-401.9) Assessment: Deteriorated  His updated medication list for this problem includes:    Micardis 40 Mg Tabs (Telmisartan) ..... One by mouth daily    Chlorthalidone 25 Mg Tabs (Chlorthalidone) .Marland Kitchen... 1/2 by mouth daily replaces the furosemide    Amlodipine Besylate 5 Mg Tabs (Amlodipine besylate) .Marland Kitchen... Take one tablet once daily  BP today: 126/80 Prior BP: 126/80 (12/12/2009)  Prior 10 Yr Risk Heart Disease: Not enough information (08/24/2007)  Labs Reviewed: K+: 5.5 (11/29/2009) Creat: : 0.8 (11/29/2009)   Chol: 218 (04/05/2009)   HDL: 74.00 (04/05/2009)   LDL: DEL (08/24/2007)   TG: 53.0 (04/05/2009)  BP today: 140/80 Prior BP: 126/80 (12/12/2009)  Prior 10 Yr Risk Heart Disease: Not enough information (08/24/2007)  Labs Reviewed: K+: 5.5 (11/29/2009) Creat: : 0.8 (11/29/2009)   Chol: 218 (04/05/2009)   HDL: 74.00 (04/05/2009)    LDL: DEL (08/24/2007)   TG: 53.0 (04/05/2009)  Problem # 3:  ATRIAL FIBRILLATION (ICD-427.31) Assessment: Unchanged  His updated medication list for this problem includes:    Warfarin Sodium 5 Mg Tabs (Warfarin sodium) ..... Use as directed by anticoagulation clinic    Amlodipine Besylate 5 Mg Tabs (Amlodipine besylate) .Marland Kitchen... Take one tablet once daily    Propafenone Hcl 225 Mg Tabs (Propafenone hcl) .Marland Kitchen... Take 1 and 1/2 tablets twice daily  Complete Medication List: 1)  Omeprazole 20 Mg Cpdr (Omeprazole) .... Once daily 2)  Meloxicam 15 Mg Tabs (Meloxicam) .... Once daily 3)  Glucosamine Relief 1000 Mg Tabs (Glucosamine sulfate) .Marland Kitchen.. 1 two times a day 4)  B-100 Balanced Tr Tbcr (B complex-folic acid) .... Once daily 5)  Niacinamide 500 Mg Tabs (Niacinamide) .... 2 tablets once daily 6)  Finasteride 5 Mg Tabs (Finasteride) .... 1/4 tab once daily 7)  Milk Thistle 500 Mg Caps (Milk thistle) .... 2 once daily 8)  Ra Flax Seed Oil 1000 1000 Mg Caps (Flaxseed (linseed)) .... Three times a day 9)  Micardis 40 Mg Tabs (Telmisartan) .Marland KitchenMarland KitchenMarland Kitchen  One by mouth daily 10)  Warfarin Sodium 5 Mg Tabs (Warfarin sodium) .... Use as directed by anticoagulation clinic 11)  Chlorthalidone 25 Mg Tabs (Chlorthalidone) .... 1/2 by mouth daily replaces the furosemide 12)  Amlodipine Besylate 5 Mg Tabs (Amlodipine besylate) .... Take one tablet once daily 13)  Propafenone Hcl 225 Mg Tabs (Propafenone hcl) .... Take 1 and 1/2 tablets twice daily 14)  Alprazolam 0.5 Mg Tabs (Alprazolam) .... One by mouth q 6 hour prn 15)  Doxepin Hcl 10 Mg Caps (Doxepin hcl) .Marland Kitchen.. 1 at bedtime  Hypertension Assessment/Plan:      The patient's hypertensive risk group is category B: At least one risk factor (excluding diabetes) with no target organ damage.  Today's blood pressure is 140/80.  His blood pressure goal is < 140/90.  Patient Instructions: 1)  Please schedule a follow-up appointment in 2 months. 2)  BMP prior to visit,  ICD-9:995.20 Prescriptions: CHLORTHALIDONE 25 MG TABS (CHLORTHALIDONE) 1/2 by mouth daily replaces the furosemide  #30 x 11   Entered and Authorized by:   Stacie Glaze MD   Signed by:   Stacie Glaze MD on 01/09/2010   Method used:   Electronically to        [facility]  [address] [phone]* (retail)       [address]       [place], [place]  [address]       Ph: [phone] or [phone]       Fax: [phone]   RxID:   [id number]

## 2010-06-25 NOTE — Progress Notes (Signed)
  Phone Note Outgoing Call Call back at [facility] Phone [phone]   Call placed by: Rita Ohara Call placed to: Patient Summary of Call: Called to remind him he needs his INR checked. Waiting for return call.  Follow-up for Phone Call        Patient called Follow-up by: Gwenn T.

## 2010-06-25 NOTE — Medication Information (Signed)
Summary: Coumadin Clinic  Anticoagulant Therapy  Managed by: Inactive PCP: Peri Jefferson Supervising MD: Tenny Craw MD, Gunnar Fusi Indication 1: Atrial fibrillation Lab Used: [facility] [place] Site: [address] INR RANGE 2.0-3.0          Comments: Patient has his INR followed at [facility] and will continue doing so.  We will make him inactive for the [facility] location.    Allergies: No Known Drug Allergies  Anticoagulation Management History:      Positive risk factors for bleeding include an age of 71 years or older.  Negative risk factors for bleeding include no history of CVA/TIA.  The bleeding index is 'intermediate risk'.  Positive CHADS2 values include History of HTN.  Negative CHADS2 values include Age > 14 years old, History of Diabetes, and Prior Stroke/CVA/TIA.  His last INR was 2.1.  Anticoagulation responsible provider: Tenny Craw MD, Gunnar Fusi.  Exp: 08/2010.    Anticoagulation Management Assessment/Plan:      The patient's current anticoagulation dose is Warfarin sodium 5 mg tabs: Use as directed by Anticoagulation Clinic.  The next INR is due 1 month.  Anticoagulation instructions were given to patient.  Results were reviewed/authorized by Inactive.         Prior Anticoagulation Instructions: INR today 2.1 Checked per Dr. Graciela Husbands, pt. pending DCCV. Followed by another provider. Bethena Midget, RN, BSN  June 21, 2009 9:39 AM

## 2010-06-25 NOTE — Assessment & Plan Note (Signed)
Summary: Travis [name]    Visit Type:  Follow-up Primary Provider:  Peri Jefferson  CC:  no complaints.  History of Present Illness: This is a 70 year old white male patient with history of recurrent atrial fibrillation and underwent cardioversion June 22, 2009. [facility] with a " sense of doom".He was evaluated by Dr.Hochrein , who felt the episode may have been anxiety related and he had a hypertensive response to it. The patient was observed. Cardiac enzymes were cycled and he was discharged home. He did find a carotid bruit on exam, until the patient he needed Dopplers.  The patient feels well today, but is anxious about the carotid bruit that was heard. He denies further palpitations, anxiety, dyspnea, or chest pain.   Current Medications (verified): 1)  Meloxicam 15 Mg Tabs (Meloxicam) .... Once Daily 2)  Glucosamine Relief 1000 Mg Tabs (Glucosamine Sulfate) .Marland Kitchen.. 1 Two Times A Day 3)  B-100 Balanced Tr   Tbcr (B Complex-Folic Acid) .... Once Daily 4)  Niacinamide 500 Mg Tabs (Niacinamide) .... 2 Tablets Once Daily 5)  Finasteride 5 Mg Tabs (Finasteride) .... 1/4 Tab Once Daily 6)  Milk Thistle 500 Mg  Caps (Milk Thistle) .... 2 Once Daily 7)  Omeprazole 20 Mg  Cpdr (Omeprazole) .... Once Daily 8)  Ra Flax Seed Oil 1000 1000 Mg  Caps (Flaxseed (Linseed)) .... Three Times A Day 9)  Bisoprolol Fumarate 5 Mg Tabs (Bisoprolol Fumarate) .... 1/2 Tablet By Mouth Daily 10)  Warfarin Sodium 5 Mg Tabs (Warfarin Sodium) .... Use As Directed By Anticoagulation Clinic 11)  Micardis 80 Mg Tabs (Telmisartan) .Marland Kitchen.. 1 Once Daily 12)  Furosemide 20 Mg  Tabs (Furosemide) .Marland Kitchen.. 1 Once Daily 13)  Amlodipine Besylate 5 Mg Tabs (Amlodipine Besylate) .... Take One Tablet Once Daily 14)  Propafenone Hcl 225 Mg Tabs (Propafenone Hcl) .Marland Kitchen.. 1 Two Times A Day 15)  Zolpidem Tartrate 10 Mg Tabs (Zolpidem Tartrate) .Marland Kitchen.. 1 As Needed Sleep  Allergies: No Known Drug  Allergies  Past History:  Past Medical History: Last updated: 08/14/2008 Osteoarthritis Hypertension Gastroesophageal reflux disease New diagnosis of nonischemic cardiomyopathy Arthritis Cerebral aneurysm status post clipping Sinus bradycardia Paroxysmal atrial fibrillation Cardiomyopathy  Review of Systems       see history of present illness  Vital Signs:  Patient profile:   70 year old male Height:      69 inches Weight:      181 pounds Pulse rate:   64 / minute Pulse rhythm:   regular BP sitting:   118 / 90  (left arm)  Vitals Entered By: Jacquelin Hawking, CMA (July 05, 2009 11:53 AM)  Physical Exam  General:   Well-nournished, in no acute distress. Neck: No JVD, HJR, Bruit, or thyroid enlargemen. tNo left carotid bruit heard today. Lungs: No tachypnea, clear without wheezing, rales, or rhonchi Cardiovascular: RRR, PMI not displaced, heart sounds normal, no murmurs, gallops, bruit, thrill, or heave. Abdomen: BS normal. Soft without organomegaly, masses, lesions or tenderness. Extremities: without cyanosis, clubbing or edema. Good distal pulses bilateral SKin: Warm, no lesions or rashes  Musculoskeletal: No deformities Neuro: no focal signs    EKG  Procedure date:  07/05/2009  Findings:      Normal sinus rhythm with incomplete left bundle branch block  Impression & Recommendations:  Problem # 1:  CAROTID BRUIT (ICD-785.9) I do not hear a carotid bruit on exam today, but Dr.Hochrein notes a loud left carotid bruit. When  seen in the hospital. We will order carotid Dopplers. Orders: Carotid Duplex (Carotid Duplex)  Problem # 2:  ATRIAL FIBRILLATION (ICD-427.31) Patient is maintaining normal sinus rhythm His updated medication list for this problem includes:    Bisoprolol Fumarate 5 Mg Tabs (Bisoprolol fumarate) .Marland Kitchen... 1/2 tablet by mouth daily    Warfarin Sodium 5 Mg Tabs (Warfarin sodium) ..... Use as directed by anticoagulation clinic    Propafenone  Hcl 225 Mg Tabs (Propafenone hcl) .Marland Kitchen... 1 two times a day  Orders: EKG w/ Interpretation (93000)  Problem # 3:  CARDIOMYOPATHY TACHYCARDIA- RESOLVED (ICD-425.9) No evidence of heart failure on exam His updated medication list for this problem includes:    Bisoprolol Fumarate 5 Mg Tabs (Bisoprolol fumarate) .Marland Kitchen... 1/2 tablet by mouth daily    Warfarin Sodium 5 Mg Tabs (Warfarin sodium) ..... Use as directed by anticoagulation clinic    Micardis 80 Mg Tabs (Telmisartan) .Marland Kitchen... 1 once daily    Furosemide 20 Mg Tabs (Furosemide) .Marland Kitchen... 1 once daily    Amlodipine Besylate 5 Mg Tabs (Amlodipine besylate) .Marland Kitchen... Take one tablet once daily    Propafenone Hcl 225 Mg Tabs (Propafenone hcl) .Marland Kitchen... 1 two times a day  Patient Instructions: 1)  Your physician recommends that you schedule a follow-up appointment in: 1 to 2 months with Dr.Klein 2)  Your physician has requested that you have a carotid duplex. This test is an ultrasound of the carotid arteries in your neck. It looks at blood flow through these arteries that supply the brain with blood. Allow one hour for this exam. There are no restrictions or special instructions.

## 2010-06-25 NOTE — Assessment & Plan Note (Signed)
Summary: PT//SLM   Nurse Visit   Allergies: No Known Drug Allergies Laboratory Results   Blood Tests     PT: 25.3 s   (Normal Range: 10.6-13.4)  INR: 4.4   (Normal Range: 0.88-1.12   Therap INR: 2.0-3.5) Comments: Joanne Chars CMA  June 07, 2009 2:15 PM     Orders Added: 1)  Est. Patient Level I [99211] 2)  Protime [16109UE]   ANTICOAGULATION RECORD PREVIOUS REGIMEN & LAB RESULTS Anticoagulation Diagnosis:  Atrial fibrillation on  12/14/2008 Previous INR Goal Range:  2.0-3.0 on  08/24/2007 Previous INR:  2.6 on  05/08/2009 Previous Coumadin Dose(mg):  5,5,7.5 on  12/14/2008 Previous Regimen:  same on  05/08/2009 Previous Coagulation Comments:  7.5mg ,7.5mg  only today and tomorrow then go back to regular dosage on  06/12/2008  NEW REGIMEN & LAB RESULTS Current INR: 4.4 Regimen: dont take for 3 days and then resume normal dosage. RTO 3 wks.per Dr. Kirtland Bouchard.   Anticoagulation Visit Questionnaire Coumadin dose missed/changed:  No Abnormal Bleeding Symptoms:  No  Any diet changes including alcohol intake, vegetables or greens since the last visit:  No Any illnesses or hospitalizations since the last visit:  No Any signs of clotting since the last visit (including chest discomfort, dizziness, shortness of breath, arm tingling, slurred speech, swelling or redness in leg):  No  MEDICATIONS MELOXICAM 15 MG TABS (MELOXICAM) once daily GLUCOSAMINE RELIEF 1000 MG TABS (GLUCOSAMINE SULFATE) 1 two times a day B-100 BALANCED TR   TBCR (B COMPLEX-FOLIC ACID) once daily NIACINAMIDE 500 MG TABS (NIACINAMIDE) 2 tablets once daily FINASTERIDE 5 MG TABS (FINASTERIDE) 1 by mouth daily MILK THISTLE 500 MG  CAPS (MILK THISTLE) 2 once daily OMEPRAZOLE 20 MG  CPDR (OMEPRAZOLE) once daily RA FLAX SEED OIL 1000 1000 MG  CAPS (FLAXSEED (LINSEED)) three times a day BISOPROLOL FUMARATE 5 MG TABS (BISOPROLOL FUMARATE) 1/2 tablet by mouth daily COUMADIN 5 MG  TABS (WARFARIN SODIUM) one by  mouth daily or as directed MICARDIS 80 MG TABS (TELMISARTAN) 1 once daily FUROSEMIDE 20 MG  TABS (FUROSEMIDE) 1 once daily * WARFARIN 5 MG 5,5,7.5 * AMLODIPINE BESY-BENAZEPRIL HCL 5 MG CAPS (AMLODIPINE BESY-BE once daily PROPAFENONE HCL 225 MG TABS (PROPAFENONE HCL) 1 two times a day ZOLPIDEM TARTRATE 10 MG TABS (ZOLPIDEM TARTRATE) 1 as needed sleep

## 2010-06-25 NOTE — Letter (Signed)
Summary: Cardioversion/TEE Instructions  [facility], Main Office  [address]   [place], [place] [address]   Phone: [phone]  Fax: [phone]     Cardioversion Instructions  You are scheduled for a Cardioversion  on Jun 22, 2009 with Dr. Tenny Craw.   Please arrive at the [facility] at 1000 a.m. on the day of your procedure.  1)   DIET:  A)   Nothing to eat or drink after midnight except your medications with a sip of water.     2)   MAKE SURE YOU TAKE YOUR COUMADIN.  3)   A)   DO NOT TAKE these medications before your procedure:      1/2 DIABETIC MEDICATION TONIGHT AND NONE IN THE MORNING     NO FUROSEMIDE IN THE MORNING      B)   YOU MAY TAKE ALL of your remaining medications with a small amount of water.    4)  Must have a responsible person to drive you home.  5)   Bring a current list of your medications and current insurance cards.   * Special Note:  Every effort is made to have your procedure done on time. Occasionally there are emergencies that present themselves at the hospital that may cause delays. Please be patient if a delay does occur.  * If you have any questions after you get home, please call the office at [phone].  Appended Document: Cardioversion/TEE Instructions ERROR:  Disgard diabetic medication, pt not diabetic

## 2010-06-25 NOTE — Progress Notes (Signed)
  Phone Note Outgoing Call   Call placed by: Rita Ohara Summary of Call: Left a message for the patient to come back so we can get some more blood from him.  Follow-up for Phone Call        Patient came back in for blood work.

## 2010-06-25 NOTE — Assessment & Plan Note (Signed)
Summary: PT/RCD   Nurse Visit   Allergies: No Known Drug Allergies Laboratory Results   Blood Tests     PT: 22.7 s   (Normal Range: 10.6-13.4)  INR: 3.5   (Normal Range: 0.88-1.12   Therap INR: 2.0-3.5) Comments: Rita Ohara  June 28, 2009 2:05 PM     Orders Added: 1)  Est. Patient Level I [99211] 2)  Protime [[id number]]    ANTICOAGULATION RECORD PREVIOUS REGIMEN & LAB RESULTS Anticoagulation Diagnosis:  Atrial fibrillation on  12/14/2008 Previous INR Goal Range:  2.0-3.0 on  08/24/2007 Previous INR:  4.4 on  06/07/2009 Previous Coumadin Dose(mg):  5,5,7.5 on  12/14/2008 Previous Regimen:  dont take for 3 days and then resume normal dosage. RTO 3 wks.per Dr. Kirtland Bouchard. on  06/07/2009 Previous Coagulation Comments:  7.5mg ,7.5mg  only today and tomorrow then go back to regular dosage on  06/12/2008  NEW REGIMEN & LAB RESULTS Current INR: 3.5 Regimen: hold 2 days then 5mg . QD Coagulation Comments: Dr. Kirtland Bouchard approved Repeat testing in: 1 month  Anticoagulation Visit Questionnaire Coumadin dose missed/changed:  No Abnormal Bleeding Symptoms:  No  Any diet changes including alcohol intake, vegetables or greens since the last visit:  No Any illnesses or hospitalizations since the last visit:  No Any signs of clotting since the last visit (including chest discomfort, dizziness, shortness of breath, arm tingling, slurred speech, swelling or redness in leg):  Yes  MEDICATIONS MELOXICAM 15 MG TABS (MELOXICAM) once daily GLUCOSAMINE RELIEF 1000 MG TABS (GLUCOSAMINE SULFATE) 1 two times a day B-100 BALANCED TR   TBCR (B COMPLEX-FOLIC ACID) once daily NIACINAMIDE 500 MG TABS (NIACINAMIDE) 2 tablets once daily FINASTERIDE 5 MG TABS (FINASTERIDE) 1/4 tab once daily MILK THISTLE 500 MG  CAPS (MILK THISTLE) 2 once daily OMEPRAZOLE 20 MG  CPDR (OMEPRAZOLE) once daily RA FLAX SEED OIL 1000 1000 MG  CAPS (FLAXSEED (LINSEED)) three times a day BISOPROLOL FUMARATE 5 MG TABS (BISOPROLOL  FUMARATE) 1/2 tablet by mouth daily WARFARIN SODIUM 5 MG TABS (WARFARIN SODIUM) Use as directed by Anticoagulation Clinic MICARDIS 80 MG TABS (TELMISARTAN) 1 once daily FUROSEMIDE 20 MG  TABS (FUROSEMIDE) 1 once daily AMLODIPINE BESYLATE 5 MG TABS (AMLODIPINE BESYLATE) take one tablet once daily PROPAFENONE HCL 225 MG TABS (PROPAFENONE HCL) 1 two times a day ZOLPIDEM TARTRATE 10 MG TABS (ZOLPIDEM TARTRATE) 1 as needed sleep

## 2010-06-25 NOTE — Assessment & Plan Note (Signed)
Summary: pt labs//ccm   Nurse Visit   Allergies: No Known Drug Allergies Laboratory Results   Blood Tests      INR: 2.1   (Normal Range: 0.88-1.12   Therap INR: 2.0-3.5) Comments: Rita Ohara  January 24, 2010 9:24 AM     Orders Added: 1)  Est. Patient Level I [99211] 2)  Protime [[id number]]   ANTICOAGULATION RECORD PREVIOUS REGIMEN & LAB RESULTS Anticoagulation Diagnosis:  Atrial fibrillation on  12/14/2008 Previous INR Goal Range:  2.0-3.0 on  08/24/2007 Previous INR:  2.9 on  12/27/2009 Previous Coumadin Dose(mg):  7.5mg  on thu 5mg  on other days on  11/29/2009 Previous Regimen:  same on  12/27/2009 Previous Coagulation Comments:  Dr. Kirtland Bouchard approved on  06/28/2009  NEW REGIMEN & LAB RESULTS Current INR: 2.1 Regimen: same  Repeat testing in: 4 weeks  Anticoagulation Visit Questionnaire Coumadin dose missed/changed:  No Abnormal Bleeding Symptoms:  No  Any diet changes including alcohol intake, vegetables or greens since the last visit:  No Any illnesses or hospitalizations since the last visit:  No Any signs of clotting since the last visit (including chest discomfort, dizziness, shortness of breath, arm tingling, slurred speech, swelling or redness in leg):  No  MEDICATIONS OMEPRAZOLE 20 MG  CPDR (OMEPRAZOLE) once daily MELOXICAM 15 MG TABS (MELOXICAM) once daily GLUCOSAMINE RELIEF 1000 MG TABS (GLUCOSAMINE SULFATE) 1 two times a day B-100 BALANCED TR   TBCR (B COMPLEX-FOLIC ACID) once daily NIACINAMIDE 500 MG TABS (NIACINAMIDE) 2 tablets once daily FINASTERIDE 5 MG TABS (FINASTERIDE) 1/4 tab once daily MILK THISTLE 500 MG  CAPS (MILK THISTLE) 2 once daily RA FLAX SEED OIL 1000 1000 MG  CAPS (FLAXSEED (LINSEED)) three times a day MICARDIS 40 MG TABS (TELMISARTAN) one by mouth daily WARFARIN SODIUM 5 MG TABS (WARFARIN SODIUM) Use as directed by Anticoagulation Clinic CHLORTHALIDONE 25 MG TABS (CHLORTHALIDONE) 1/2 by mouth daily replaces the  furosemide AMLODIPINE BESYLATE 5 MG TABS (AMLODIPINE BESYLATE) take one tablet once daily PROPAFENONE HCL 225 MG TABS (PROPAFENONE HCL) Take 1 and 1/2 tablets twice daily ALPRAZOLAM 0.5 MG TABS (ALPRAZOLAM) one by mouth q 6 hour prn DOXEPIN HCL 10 MG CAPS (DOXEPIN HCL) 1 at bedtime

## 2010-06-25 NOTE — Assessment & Plan Note (Signed)
Summary: PT/JLP   Nurse Visit   Allergies: No Known Drug Allergies Laboratory Results   Blood Tests     PT: 16.8 s   (Normal Range: 10.6-13.4)  INR: 1.9   (Normal Range: 0.88-1.12   Therap INR: 2.0-3.5) Comments: Rita Ohara  July 26, 2009 2:07 PM     Orders Added: 1)  Est. Patient Level I [99211] 2)  Protime [[id number]]    ANTICOAGULATION RECORD PREVIOUS REGIMEN & LAB RESULTS Anticoagulation Diagnosis:  Atrial fibrillation on  12/14/2008 Previous INR Goal Range:  2.0-3.0 on  08/24/2007 Previous INR:  3.5 on  06/28/2009 Previous Coumadin Dose(mg):  5,5,7.5 on  12/14/2008 Previous Regimen:  hold 2 days then 5mg . QD on  06/28/2009 Previous Coagulation Comments:  Dr. Kirtland Bouchard approved on  06/28/2009  NEW REGIMEN & LAB RESULTS Current INR: 1.9 Regimen: 7.5mg . TH 5mg . others  Repeat testing in: 1 month  Anticoagulation Visit Questionnaire Coumadin dose missed/changed:  No Abnormal Bleeding Symptoms:  No  Any diet changes including alcohol intake, vegetables or greens since the last visit:  No Any illnesses or hospitalizations since the last visit:  No Any signs of clotting since the last visit (including chest discomfort, dizziness, shortness of breath, arm tingling, slurred speech, swelling or redness in leg):  No  MEDICATIONS MELOXICAM 15 MG TABS (MELOXICAM) once daily GLUCOSAMINE RELIEF 1000 MG TABS (GLUCOSAMINE SULFATE) 1 two times a day B-100 BALANCED TR   TBCR (B COMPLEX-FOLIC ACID) once daily NIACINAMIDE 500 MG TABS (NIACINAMIDE) 2 tablets once daily FINASTERIDE 5 MG TABS (FINASTERIDE) 1/4 tab once daily MILK THISTLE 500 MG  CAPS (MILK THISTLE) 2 once daily OMEPRAZOLE 20 MG  CPDR (OMEPRAZOLE) once daily RA FLAX SEED OIL 1000 1000 MG  CAPS (FLAXSEED (LINSEED)) three times a day BISOPROLOL FUMARATE 5 MG TABS (BISOPROLOL FUMARATE) 1/2 tablet by mouth daily WARFARIN SODIUM 5 MG TABS (WARFARIN SODIUM) Use as directed by Anticoagulation Clinic MICARDIS 80 MG TABS  (TELMISARTAN) 1 once daily FUROSEMIDE 20 MG  TABS (FUROSEMIDE) 1 once daily AMLODIPINE BESYLATE 5 MG TABS (AMLODIPINE BESYLATE) take one tablet once daily PROPAFENONE HCL 225 MG TABS (PROPAFENONE HCL) 1 two times a day ZOLPIDEM TARTRATE 10 MG TABS (ZOLPIDEM TARTRATE) 1 as needed sleep

## 2010-06-25 NOTE — Assessment & Plan Note (Signed)
Summary: PER CHECK OUT/SF      Allergies Added: NKDA  Visit Type:  Follow-up Primary Provider:  Peri Jefferson   History of Present Illness: Travis [name] is seen in followup for atrial fibrillation.  He has been maintained on Rythmol 225 twice daily. Early January he had recurrent atrial fibrillation prompting cardioversion.  At his last visit we increased his Rythmol to 335 twice a da( or so) he has had no intercurrent atrial fibrillation. He feels well but has developed a dry mouth over the last couple of months. This is concurrent with the aforementioned drug change..    Current Medications (verified): 1)  Omeprazole 20 Mg  Cpdr (Omeprazole) .... Once Daily 2)  Meloxicam 15 Mg Tabs (Meloxicam) .... Once Daily 3)  Glucosamine Relief 1000 Mg Tabs (Glucosamine Sulfate) .Marland Kitchen.. 1 Two Times A Day 4)  B-100 Balanced Tr   Tbcr (B Complex-Folic Acid) .... Once Daily 5)  Niacinamide 500 Mg Tabs (Niacinamide) .... 2 Tablets Once Daily 6)  Finasteride 5 Mg Tabs (Finasteride) .... 1/4 Tab Once Daily 7)  Milk Thistle 500 Mg  Caps (Milk Thistle) .... 2 Once Daily 8)  Ra Flax Seed Oil 1000 1000 Mg  Caps (Flaxseed (Linseed)) .... Three Times A Day 9)  Bisoprolol Fumarate 5 Mg Tabs (Bisoprolol Fumarate) .... 1/2 Tablet By Mouth Daily 10)  Warfarin Sodium 5 Mg Tabs (Warfarin Sodium) .... Use As Directed By Anticoagulation Clinic 11)  Micardis 40 Mg Tabs (Telmisartan) .... One By Mouth Daily 12)  Furosemide 20 Mg  Tabs (Furosemide) .Marland Kitchen.. 1 Once Daily 13)  Amlodipine Besylate 5 Mg Tabs (Amlodipine Besylate) .... Take One Tablet Once Daily 14)  Propafenone Hcl 225 Mg Tabs (Propafenone Hcl) .... Take 1 and 1/2 Tablets Twice Daily 15)  Alprazolam 0.5 Mg Tabs (Alprazolam) .... One By Mouth Q 6 Hour Prn 16)  Doxepin Hcl 10 Mg Caps (Doxepin Hcl) .Marland Kitchen.. 1 At Bedtime  Allergies (verified): No Known Drug Allergies  Past History:  Past Medical History: Last updated:  08/14/2008 Osteoarthritis Hypertension Gastroesophageal reflux disease New diagnosis of nonischemic cardiomyopathy Arthritis Cerebral aneurysm status post clipping Sinus bradycardia Paroxysmal atrial fibrillation Cardiomyopathy  Vital Signs:  Patient profile:   70 year old male Height:      68 inches Weight:      187 pounds BMI:     28.54 Pulse rate:   51 / minute BP sitting:   122 / 90  (left arm)  Vitals Entered By: Laurance Flatten CMA (November 07, 2009 4:33 PM)  Physical Exam  General:  The patient was alert and oriented in no acute distress. HEENT Normal.  Neck veins were flat, carotids were brisk.  Lungs were clear.  Heart sounds were regular without murmurs or gallops.  Abdomen was soft with active bowel sounds. There is no clubbing cyanosis or edema. Skin Warm and dry    Impression & Recommendations:  Problem # 1:  ATRIAL FIBRILLATION (ICD-427.31) he is holding sinus rhythm on his dose up a past known. We will continue it. He is appropriately on warfarin.  His IVCD noted on his ECG is new and may be related to the higher dose per past known. This may also be effecting and/or contributing to his sinus bradycardia which appears to be asymptomatic His updated medication list for this problem includes:    Bisoprolol Fumarate 5 Mg Tabs (Bisoprolol fumarate) .Marland Kitchen... 1/2 tablet by mouth daily    Warfarin Sodium 5 Mg Tabs (Warfarin sodium) ..... Use as directed by  anticoagulation clinic    Propafenone Hcl 225 Mg Tabs (Propafenone hcl) .Marland Kitchen... Take 1 and 1/2 tablets twice daily  Problem # 2:  DRY MOUTH (ICD-527.7) I wonder whether his dry mouth is related to his Rythmol dose increase. We will review this.  Problem # 3:  ABNORMAL ELECTROCARDIOGRAM (ICD-794.31) See the above  compared the O2 cardiogram with the prior one from January. The QRS duration is increased from 110-130 milliseconds His updated medication list for this problem includes:    Bisoprolol Fumarate 5 Mg Tabs  (Bisoprolol fumarate) .Marland Kitchen... 1/2 tablet by mouth daily    Warfarin Sodium 5 Mg Tabs (Warfarin sodium) ..... Use as directed by anticoagulation clinic    Amlodipine Besylate 5 Mg Tabs (Amlodipine besylate) .Marland Kitchen... Take one tablet once daily    Propafenone Hcl 225 Mg Tabs (Propafenone hcl) .Marland Kitchen... Take 1 and 1/2 tablets twice daily  Problem # 4:  CAROTID BRUIT (ICD-785.9) repeat scanning is anticipated next year

## 2010-06-25 NOTE — Assessment & Plan Note (Signed)
Summary: pt//ccm   Nurse Visit   Allergies: No Known Drug Allergies Laboratory Results   Blood Tests      INR: 2.1   (Normal Range: 0.88-1.12   Therap INR: 2.0-3.5) Comments: Rita Ohara  October 29, 2009 1:54 PM     Orders Added: 1)  Est. Patient Level I [99211] 2)  Protime [16109UE]   ANTICOAGULATION RECORD PREVIOUS REGIMEN & LAB RESULTS Anticoagulation Diagnosis:  Atrial fibrillation on  12/14/2008 Previous INR Goal Range:  2.0-3.0 on  08/24/2007 Previous INR:  2.8 on  09/24/2009 Previous Coumadin Dose(mg):  5,5,7.5 on  12/14/2008 Previous Regimen:  same on  08/27/2009 Previous Coagulation Comments:  Dr. Kirtland Bouchard approved on  06/28/2009  NEW REGIMEN & LAB RESULTS Current INR: 2.1 Regimen: same  Repeat testing in: 4 weeks  Anticoagulation Visit Questionnaire Coumadin dose missed/changed:  No Abnormal Bleeding Symptoms:  No  Any diet changes including alcohol intake, vegetables or greens since the last visit:  No Any illnesses or hospitalizations since the last visit:  No Any signs of clotting since the last visit (including chest discomfort, dizziness, shortness of breath, arm tingling, slurred speech, swelling or redness in leg):  No  MEDICATIONS MELOXICAM 15 MG TABS (MELOXICAM) once daily GLUCOSAMINE RELIEF 1000 MG TABS (GLUCOSAMINE SULFATE) 1 two times a day B-100 BALANCED TR   TBCR (B COMPLEX-FOLIC ACID) once daily NIACINAMIDE 500 MG TABS (NIACINAMIDE) 2 tablets once daily FINASTERIDE 5 MG TABS (FINASTERIDE) 1/4 tab once daily MILK THISTLE 500 MG  CAPS (MILK THISTLE) 2 once daily OMEPRAZOLE 20 MG  CPDR (OMEPRAZOLE) once daily RA FLAX SEED OIL 1000 1000 MG  CAPS (FLAXSEED (LINSEED)) three times a day BISOPROLOL FUMARATE 5 MG TABS (BISOPROLOL FUMARATE) 1/2 tablet by mouth daily WARFARIN SODIUM 5 MG TABS (WARFARIN SODIUM) Use as directed by Anticoagulation Clinic MICARDIS 40 MG TABS (TELMISARTAN) one by mouth daily FUROSEMIDE 20 MG  TABS (FUROSEMIDE) 1 once  daily AMLODIPINE BESYLATE 5 MG TABS (AMLODIPINE BESYLATE) take one tablet once daily PROPAFENONE HCL 225 MG TABS (PROPAFENONE HCL) Take 1 and 1/2 tablets twice daily ALPRAZOLAM 0.5 MG TABS (ALPRAZOLAM) one by mouth q 6 hour prn DOXEPIN HCL 10 MG CAPS (DOXEPIN HCL) 1 at bedtime

## 2010-06-25 NOTE — Assessment & Plan Note (Signed)
Summary: afib/pla  Medications Added FINASTERIDE 5 MG TABS (FINASTERIDE) 1/4 tab once daily WARFARIN SODIUM 5 MG TABS (WARFARIN SODIUM) Use as directed by Anticoagulation Clinic      Allergies Added: NKDA  Visit Type:  Follow-up Primary Provider:  Peri Jefferson  CC:  Pt felt that he was in Afib about aweek ago.Marland Kitchen  History of Present Illness: Travis [name] is seen in followup for paroxysmal atrial fibrillation and resolved tachycardia induced cardiomyopathy.  He comes in today with awareness that he has gone back into atrial fibrillation about a week ago.  He is aware  of palpitations, increasing fatigue and notes increase in HR on his BP machine There has been no LH or CP  Problems Prior to Update: 1)  Atrial Fibrillation  (ICD-427.31) 2)  Coumadin Therapy  (ICD-V58.61) 3)  Cardiomyopathy, Secondary  (ICD-425.9) 4)  Hyperkalemia  (ICD-276.7) 5)  Transient Disorder Initiating/maintaining Sleep  (ICD-307.41) 6)  Hypertension  (ICD-401.9) 7)  Ganglion Cyst, Wrist, Left  (ICD-727.41) 8)  Gerd  (ICD-530.81) 9)  Encounter For Therapeutic Drug Monitoring  (ICD-V58.83) 10)  Uns Advrs Eff Uns Rx Medicinal&biological Sbstnc  (ICD-995.20) 11)  Ganglion Cyst, Wrist, Left  (ICD-727.41) 12)  Osteoarthritis  (ICD-715.90)  Current Medications (verified): 1)  Meloxicam 15 Mg Tabs (Meloxicam) .... Once Daily 2)  Glucosamine Relief 1000 Mg Tabs (Glucosamine Sulfate) .Marland Kitchen.. 1 Two Times A Day 3)  B-100 Balanced Tr   Tbcr (B Complex-Folic Acid) .... Once Daily 4)  Niacinamide 500 Mg Tabs (Niacinamide) .... 2 Tablets Once Daily 5)  Finasteride 5 Mg Tabs (Finasteride) .... 1/4 Tab Once Daily 6)  Milk Thistle 500 Mg  Caps (Milk Thistle) .... 2 Once Daily 7)  Omeprazole 20 Mg  Cpdr (Omeprazole) .... Once Daily 8)  Ra Flax Seed Oil 1000 1000 Mg  Caps (Flaxseed (Linseed)) .... Three Times A Day 9)  Bisoprolol Fumarate 5 Mg Tabs (Bisoprolol Fumarate) .... 1/2 Tablet By Mouth Daily 10)  Warfarin Sodium 5 Mg  Tabs (Warfarin Sodium) .... Use As Directed By Anticoagulation Clinic 11)  Micardis 80 Mg Tabs (Telmisartan) .Marland Kitchen.. 1 Once Daily 12)  Furosemide 20 Mg  Tabs (Furosemide) .Marland Kitchen.. 1 Once Daily 13)  Amlodipine Besylate 5 Mg Tabs (Amlodipine Besylate) .... Take One Tablet Once Daily 14)  Propafenone Hcl 225 Mg Tabs (Propafenone Hcl) .Marland Kitchen.. 1 Two Times A Day 15)  Zolpidem Tartrate 10 Mg Tabs (Zolpidem Tartrate) .Marland Kitchen.. 1 As Needed Sleep  Allergies (verified): No Known Drug Allergies  Past History:  Past Medical History: Last updated: 08/14/2008 Osteoarthritis Hypertension Gastroesophageal reflux disease New diagnosis of nonischemic cardiomyopathy Arthritis Cerebral aneurysm status post clipping Sinus bradycardia Paroxysmal atrial fibrillation Cardiomyopathy  Past Surgical History: Last updated: 02/04/2007 EDG-12/26/2005 Aurysm with clips Rotator cuff repair left Fracture arm left childhod Tonsillectomy Inguinal herniorrhaphy bilateral tendon repair left  Vital Signs:  Patient profile:   70 year old male Height:      69 inches Weight:      182 pounds BMI:     26.97  Vitals Entered By: Travis [name] CMA (June 21, 2009 8:55 AM)  Physical Exam  General:  Well developed, well nourished, in no acute distress. Head:  HEENT normal Neck:  Neck supple, no JVD. No masses, thyromegaly or abnormal cervical nodes. Lungs:  clear to auscultation and percussion Heart:  irregularly irregular without murmurs Abdomen:  soft nontender bowel sounds active no hepatomegaly Pulses:  intact distal pulse Extremities:  no clubbing cyanosis or edema Neurologic:  gross and normal  Impression & Recommendations:  Problem # 1:  ATRIAL FIBRILLATION (ICD-427.31) The patient has recurrence of his paroxysmal atrial fibrillation. His rate is well controlled. He is still however quite symptomatic. It has been 10 months since his last cardioversion. Will plan to undertake repeat cardioversion. Will check his  metabolic profile today as well as his INR. His updated medication list for this problem includes:    Bisoprolol Fumarate 5 Mg Tabs (Bisoprolol fumarate) .Marland Kitchen... 1/2 tablet by mouth daily    Warfarin Sodium 5 Mg Tabs (Warfarin sodium) ..... Use as directed by anticoagulation clinic    Propafenone Hcl 225 Mg Tabs (Propafenone hcl) .Marland Kitchen... 1 two times a day  Orders: EKG w/ Interpretation (93000) TLB-BMP (Basic Metabolic Panel-BMET) (80048-METABOL) TLB-CBC Platelet - w/Differential (85025-CBCD) TLB-Magnesium (Mg) (83735-MG) T-2 View CXR (71020TC)  Problem # 2:  CARDIOMYOPATHY TACHYCARDIA- RESOLVED (ICD-425.9) His last echo demonstrated normalization of left ventricular function. That was about 18 months ago. We will continue him on his current medicines. His updated medication list for this problem includes:    Bisoprolol Fumarate 5 Mg Tabs (Bisoprolol fumarate) .Marland Kitchen... 1/2 tablet by mouth daily    Warfarin Sodium 5 Mg Tabs (Warfarin sodium) ..... Use as directed by anticoagulation clinic    Micardis 80 Mg Tabs (Telmisartan) .Marland Kitchen... 1 once daily    Furosemide 20 Mg Tabs (Furosemide) .Marland Kitchen... 1 once daily    Amlodipine Besylate 5 Mg Tabs (Amlodipine besylate) .Marland Kitchen... Take one tablet once daily    Propafenone Hcl 225 Mg Tabs (Propafenone hcl) .Marland Kitchen... 1 two times a day  Problem # 3:  HYPERTENSION (ICD-401.9) blood pressure hasn't well controlled. We reviewed his home data. There have been some diastolics in the 90s but otherwise his systolics have all been very good His updated medication list for this problem includes:    Bisoprolol Fumarate 5 Mg Tabs (Bisoprolol fumarate) .Marland Kitchen... 1/2 tablet by mouth daily    Micardis 80 Mg Tabs (Telmisartan) .Marland Kitchen... 1 once daily    Furosemide 20 Mg Tabs (Furosemide) .Marland Kitchen... 1 once daily    Amlodipine Besylate 5 Mg Tabs (Amlodipine besylate) .Marland Kitchen... Take one tablet once daily  Problem # 4:  HYPERKALEMIA (ICD-276.7) We will recheck his metabolic profile today in anticipation of  cardioversion  Patient Instructions: 1)  Your physician has recommended that you have a cardioversion (DCCV).  Electrical cardioversion uses a jolt of electricity to your heart either through paddles or wired patches attached to your chest. This is a controlled, usually prescheduled, procedure. Defibrillation is done under light anesthesia in the hospital, and you usually go home the day of the procedure. This is done to get your heart back into a normal rhythm. You are not awake for the procedure. Please see the instruction sheet given to you today. 2)  Your physician recommends that you HAVE LABS TODAY: CBC, BMET, MG AND INR (DONE) 3)  A chest x-ray takes a picture of the organs and structures inside the chest, including the heart, lungs, and blood vessels. This test can show several things, including, whether the heart is enlarged; whether fluid is building up in the lungs; and whether pacemaker / defibrillator leads are still in place.

## 2010-06-25 NOTE — Miscellaneous (Signed)
Summary: med changes  Clinical Lists Changes  Medications: Changed medication from PROPAFENONE HCL 225 MG TABS (PROPAFENONE HCL) 1 two times a day to PROPAFENONE HCL 225 MG TABS (PROPAFENONE HCL) Take 1 and 1/2 tablets twice daily - Signed Rx of PROPAFENONE HCL 225 MG TABS (PROPAFENONE HCL) Take 1 and 1/2 tablets twice daily;  #90 x 11;  Signed;  Entered by: Proofreader;  Authorized by: Nathen May, MD, [facility];  Method used: Electronically to [facility] [address] [phone]*, [address], [place], [place], [place]  [address], Ph: [phone] or [phone], Fax: [phone]    Prescriptions: PROPAFENONE HCL 225 MG TABS (PROPAFENONE HCL) Take 1 and 1/2 tablets twice daily  #90 x 11   Entered by:   Optometrist BSN   Authorized by:   Nathen May, MD, [facility]   Signed by:   Gypsy Balsam RN BSN on 08/28/2009   Method used:   [facility]        [facility]  [address] [phone]* (retail)       [address]       [place], [place]  [address]       Ph: [phone] or [phone]       Fax: [phone]   RxID:   [id number]

## 2010-06-25 NOTE — Assessment & Plan Note (Signed)
Summary: 2 MONTH ROV/NJR   Vital Signs:  Patient profile:   70 year old male Height:      68 inches Weight:      190 pounds BMI:     28.99 Temp:     98.2 degrees F oral Pulse rate:   72 / minute Resp:     14 per minute BP sitting:   126 / 80  (left arm) Cuff size:   regular  Vitals Entered By: Willy Eddy, LPN (December 12, 2009 10:12 AM) CC: roa labs Is Patient Diabetic? Yes Did you bring your meter with you today? No   CC:  roa labs.  Preventive Screening-Counseling & Management  Alcohol-Tobacco     Smoking Status: quit     Packs/Day: 1.0     Year Started: 1968     Year Quit: 1988     Passive Smoke Exposure: no  Current Problems (verified): 1)  Abnormal Electrocardiogram  (ICD-794.31) 2)  Dry Mouth  (ICD-527.7) 3)  Hyponatremia  (ICD-276.1) 4)  Ing Hern w/o Mention Obst/gangren Unilat/unspec  (ICD-550.90) 5)  Carotid Bruit  (ICD-785.9) 6)  Atrial Fibrillation  (ICD-427.31) 7)  Cardiomyopathy Tachycardia- Resolved  (ICD-425.9) 8)  Coumadin Therapy  (ICD-V58.61) 9)  Hyperkalemia  (ICD-276.7) 10)  Transient Disorder Initiating/maintaining Sleep  (ICD-307.41) 11)  Hypertension  (ICD-401.9) 12)  Ganglion Cyst, Wrist, Left  (ICD-727.41) 13)  Gerd  (ICD-530.81) 14)  Encounter For Therapeutic Drug Monitoring  (ICD-V58.83) 15)  Uns Advrs Eff Uns Rx Medicinal&biological Sbstnc  (ICD-995.20) 16)  Ganglion Cyst, Wrist, Left  (ICD-727.41) 17)  Osteoarthritis  (ICD-715.90)  Current Medications (verified): 1)  Omeprazole 20 Mg  Cpdr (Omeprazole) .... Once Daily 2)  Meloxicam 15 Mg Tabs (Meloxicam) .... Once Daily 3)  Glucosamine Relief 1000 Mg Tabs (Glucosamine Sulfate) .Marland Kitchen.. 1 Two Times A Day 4)  B-100 Balanced Tr   Tbcr (B Complex-Folic Acid) .... Once Daily 5)  Niacinamide 500 Mg Tabs (Niacinamide) .... 2 Tablets Once Daily 6)  Finasteride 5 Mg Tabs (Finasteride) .... 1/4 Tab Once Daily 7)  Milk Thistle 500 Mg  Caps (Milk Thistle) .... 2 Once Daily 8)  Ra Flax Seed  Oil 1000 1000 Mg  Caps (Flaxseed (Linseed)) .... Three Times A Day 9)  Bisoprolol Fumarate 5 Mg Tabs (Bisoprolol Fumarate) .... 1/2 Tablet By Mouth Daily 10)  Warfarin Sodium 5 Mg Tabs (Warfarin Sodium) .... Use As Directed By Anticoagulation Clinic 11)  Micardis 40 Mg Tabs (Telmisartan) .... One By Mouth Daily 12)  Furosemide 20 Mg  Tabs (Furosemide) .Marland Kitchen.. 1 Once Daily 13)  Amlodipine Besylate 5 Mg Tabs (Amlodipine Besylate) .... Take One Tablet Once Daily 14)  Propafenone Hcl 225 Mg Tabs (Propafenone Hcl) .... Take 1 and 1/2 Tablets Twice Daily 15)  Alprazolam 0.5 Mg Tabs (Alprazolam) .... One By Mouth Q 6 Hour Prn 16)  Doxepin Hcl 10 Mg Caps (Doxepin Hcl) .Marland Kitchen.. 1 At Bedtime  Allergies (verified): No Known Drug Allergies  Past History:  Family History: Last updated: 08/24/2007 Family History of Stroke M 1st degree relative <50 Family History Lung cancer  Social History: Last updated: 08/14/2008 Former Smoker Single--no children Retired--paper products Alcohol Use - no  Risk Factors: Smoking Status: quit (12/12/2009) Packs/Day: 1.0 (12/12/2009) Passive Smoke Exposure: no (12/12/2009)  Past medical, surgical, family and social histories (including risk factors) reviewed, and no changes noted (except as noted below).  Past Medical History: Reviewed history from 08/14/2008 and no changes required. Osteoarthritis Hypertension Gastroesophageal reflux disease New diagnosis  of nonischemic cardiomyopathy Arthritis Cerebral aneurysm status post clipping Sinus bradycardia Paroxysmal atrial fibrillation Cardiomyopathy  Past Surgical History: Reviewed history from 02/04/2007 and no changes required. EDG-12/26/2005 Aurysm with clips Rotator cuff repair left Fracture arm left childhod Tonsillectomy Inguinal herniorrhaphy bilateral tendon repair left  Family History: Reviewed history from 08/24/2007 and no changes required. Family History of Stroke M 1st degree relative  <50 Family History Lung cancer  Social History: Reviewed history from 08/14/2008 and no changes required. Former Smoker Single--no children Retired--paper products Alcohol Use - no  Review of Systems  The patient denies anorexia, fever, weight loss, weight gain, vision loss, decreased hearing, hoarseness, chest pain, syncope, dyspnea on exertion, peripheral edema, prolonged cough, headaches, hemoptysis, abdominal pain, melena, hematochezia, severe indigestion/heartburn, hematuria, incontinence, genital sores, muscle weakness, suspicious skin lesions, transient blindness, difficulty walking, depression, unusual weight change, abnormal bleeding, enlarged lymph nodes, angioedema, and breast masses.    Physical Exam  General:  alert and well-developed.   Head:  normocephalic and atraumatic.   Eyes:  pupils equal and pupils round.   Ears:  R ear normal and L ear normal.   Nose:  no external deformity and no nasal discharge.   Mouth:  pharynx pink and moist.  good dentition.   Neck:  No deformities, masses, or tenderness noted. Lungs:  Normal respiratory effort, chest expands symmetrically. Lungs are clear to auscultation, no crackles or wheezes. Heart:  Normal rate and regular rhythm. S1 and S2 normal without gallop, murmur, click, rub or other extra sounds. Abdomen:  Bowel sounds positive,abdomen soft and non-tender without masses, organomegaly or hernias noted. Msk:  No deformity or scoliosis noted of thoracic or lumbar spine.   Extremities:  No clubbing, cyanosis, edema, or deformity noted with normal full range of motion of all joints.   Neurologic:  No cranial nerve deficits noted. Station and gait are normal. Plantar reflexes are down-going bilaterally. DTRs are symmetrical throughout. Sensory, motor and coordinative functions appear intact.   Impression & Recommendations:  Problem # 1:  HYPONATREMIA (ICD-276.1) stable at 129 persistnat low sodium whe changes the micardis  to  Problem # 2:  ATRIAL FIBRILLATION (ICD-427.31) discussion of pradaxa His updated medication list for this problem includes:    Bystolic 5 Mg Tabs (Nebivolol hcl) ..... One by mouth daily    Warfarin Sodium 5 Mg Tabs (Warfarin sodium) ..... Use as directed by anticoagulation clinic    Amlodipine Besylate 5 Mg Tabs (Amlodipine besylate) .Marland Kitchen... Take one tablet once daily    Propafenone Hcl 225 Mg Tabs (Propafenone hcl) .Marland Kitchen... Take 1 and 1/2 tablets twice daily  Problem # 3:  HYPERTENSION (ICD-401.9)  The following medications were removed from the medication list:    Micardis 40 Mg Tabs (Telmisartan) ..... One by mouth daily His updated medication list for this problem includes:    Bystolic 5 Mg Tabs (Nebivolol hcl) ..... One by mouth daily    Furosemide 20 Mg Tabs (Furosemide) .Marland Kitchen... 1 once daily    Amlodipine Besylate 5 Mg Tabs (Amlodipine besylate) .Marland Kitchen... Take one tablet once daily  BP today: 126/80 Prior BP: 122/90 (11/07/2009)  Prior 10 Yr Risk Heart Disease: Not enough information (08/24/2007)  Labs Reviewed: K+: 5.5 (11/29/2009) Creat: : 0.8 (11/29/2009)   Chol: 218 (04/05/2009)   HDL: 74.00 (04/05/2009)   LDL: DEL (08/24/2007)   TG: 53.0 (04/05/2009)  Problem # 4:  DRY MOUTH (ICD-527.7) cut the BB  Complete Medication List: 1)  Omeprazole 20 Mg Cpdr (Omeprazole) .... Once daily  2)  Meloxicam 15 Mg Tabs (Meloxicam) .... Once daily 3)  Glucosamine Relief 1000 Mg Tabs (Glucosamine sulfate) .Marland Kitchen.. 1 two times a day 4)  B-100 Balanced Tr Tbcr (B complex-folic acid) .... Once daily 5)  Niacinamide 500 Mg Tabs (Niacinamide) .... 2 tablets once daily 6)  Finasteride 5 Mg Tabs (Finasteride) .... 1/4 tab once daily 7)  Milk Thistle 500 Mg Caps (Milk thistle) .... 2 once daily 8)  Ra Flax Seed Oil 1000 1000 Mg Caps (Flaxseed (linseed)) .... Three times a day 9)  Bystolic 5 Mg Tabs (Nebivolol hcl) .... One by mouth daily 10)  Warfarin Sodium 5 Mg Tabs (Warfarin sodium) .... Use as  directed by anticoagulation clinic 11)  Furosemide 20 Mg Tabs (Furosemide) .Marland Kitchen.. 1 once daily 12)  Amlodipine Besylate 5 Mg Tabs (Amlodipine besylate) .... Take one tablet once daily 13)  Propafenone Hcl 225 Mg Tabs (Propafenone hcl) .... Take 1 and 1/2 tablets twice daily 14)  Alprazolam 0.5 Mg Tabs (Alprazolam) .... One by mouth q 6 hour prn 15)  Doxepin Hcl 10 Mg Caps (Doxepin hcl) .Marland Kitchen.. 1 at bedtime  Patient Instructions: 1)  Please schedule a follow-up appointment in 1 month.  may add at 4 i needed

## 2010-06-25 NOTE — Progress Notes (Signed)
Summary: NEED FOR LABWORK?  Phone Note Call from Patient   Caller: Patient  [phone] Reason for Call: Acute Illness Summary of Call: Pt wants to know if he needs to come in for labwork prior to his appt with Dr Lovell Sheehan on 8/17.... Pt adv that he has had labs done before appt to ck sodium level?  Req for labs prior to appt not in EMR.....  Can you advise?  Initial call taken by: Debbra Riding,  January 02, 2010 10:15 AM  Follow-up for Phone Call        yes he may come and have a bmet with dx code of 276.1 Follow-up by: Willy Eddy, LPN,  January 02, 2010 10:30 AM  Additional Follow-up for Phone Call Additional follow up Details #1::        Appt scheduled for pt..... done.  Additional Follow-up by: Debbra Riding,  January 02, 2010 10:34 AM

## 2010-06-25 NOTE — Assessment & Plan Note (Signed)
Summary: protime//ccm   Nurse Visit   Allergies: No Known Drug Allergies Laboratory Results   Blood Tests   Date/Time Received: Sep 24, 2009 3:19 PM  Date/Time Reported: Sep 24, 2009 3:19 PM   PT: 20.1 s   (Normal Range: 10.6-13.4)  INR: 2.8   (Normal Range: 0.88-1.12   Therap INR: 2.0-3.5) Comments: Rita Ohara  Sep 24, 2009 3:18 PM     Orders Added: 1)  Est. Patient Level I [99211] 2)  Protime [28413KG]   ANTICOAGULATION RECORD PREVIOUS REGIMEN & LAB RESULTS Anticoagulation Diagnosis:  Atrial fibrillation on  12/14/2008 Previous INR Goal Range:  2.0-3.0 on  08/24/2007 Previous INR:  2.1 on  08/27/2009 Previous Coumadin Dose(mg):  5,5,7.5 on  12/14/2008 Previous Regimen:  same on  08/27/2009 Previous Coagulation Comments:  Dr. Kirtland Bouchard approved on  06/28/2009  NEW REGIMEN & LAB RESULTS Current INR: 2.8 Regimen: same  (no change)  Repeat testing in: 1 Month

## 2010-06-25 NOTE — Progress Notes (Signed)
Summary: **LMOM** Carotid Duplex Results   Phone Note Outgoing Call   Call placed by: Duncan Dull, RN, BSN,  August 20, 2009 12:15 PM Call placed to: Patient Summary of Call: Called patient and left message on machine To discuss Carotid Artery Duplex Initial call taken by: Duncan Dull, RN, BSN,  August 20, 2009 12:15 PM  Follow-up for Phone Call        Pt notified of results. Gypsy Balsam RN BSN  August 23, 2009 8:57 AM

## 2010-06-25 NOTE — Assessment & Plan Note (Signed)
Summary: 4 month rov/njr   Vital Signs:  Patient profile:   70 year old male Height:      69 inches Weight:      180 pounds BMI:     26.68 Temp:     98.2 degrees F oral Pulse rate:   64 / minute Resp:     14 per minute BP sitting:   116 / 78  (left arm)  Vitals Entered By: Willy Eddy, LPN (August 08, 2009 10:03 AM) CC: roa   Primary Care Provider:  Peri Jefferson  CC:  roa.  History of Present Illness: depresson in waves  reveiwed the er RESULTS FOR VSIT HYPONATRIMIA IS PERSISTANT AND POSSIBLE A DRUG SIDE EFFECT DISCUSSOMN OF WEIGH AND DIET AND BLOOD PRESSURE I have spent greater that 45 min face to face evaluating this patient and over 1/2 of this time was in councilling   Preventive Screening-Counseling & Management  Alcohol-Tobacco     Smoking Status: quit     Packs/Day: 1.0     Year Started: 1968     Year Quit: 1988     Passive Smoke Exposure: no  Problems Prior to Update: 1)  Carotid Bruit  (ICD-785.9) 2)  Atrial Fibrillation  (ICD-427.31) 3)  Cardiomyopathy Tachycardia- Resolved  (ICD-425.9) 4)  Coumadin Therapy  (ICD-V58.61) 5)  Hyperkalemia  (ICD-276.7) 6)  Transient Disorder Initiating/maintaining Sleep  (ICD-307.41) 7)  Hypertension  (ICD-401.9) 8)  Ganglion Cyst, Wrist, Left  (ICD-727.41) 9)  Gerd  (ICD-530.81) 10)  Encounter For Therapeutic Drug Monitoring  (ICD-V58.83) 11)  Uns Advrs Eff Uns Rx Medicinal&biological Sbstnc  (ICD-995.20) 12)  Ganglion Cyst, Wrist, Left  (ICD-727.41) 13)  Osteoarthritis  (ICD-715.90)  Current Problems (verified): 1)  Carotid Bruit  (ICD-785.9) 2)  Atrial Fibrillation  (ICD-427.31) 3)  Cardiomyopathy Tachycardia- Resolved  (ICD-425.9) 4)  Coumadin Therapy  (ICD-V58.61) 5)  Hyperkalemia  (ICD-276.7) 6)  Transient Disorder Initiating/maintaining Sleep  (ICD-307.41) 7)  Hypertension  (ICD-401.9) 8)  Ganglion Cyst, Wrist, Left  (ICD-727.41) 9)  Gerd  (ICD-530.81) 10)  Encounter For Therapeutic Drug  Monitoring  (ICD-V58.83) 11)  Uns Advrs Eff Uns Rx Medicinal&biological Sbstnc  (ICD-995.20) 12)  Ganglion Cyst, Wrist, Left  (ICD-727.41) 13)  Osteoarthritis  (ICD-715.90)  Medications Prior to Update: 1)  Meloxicam 15 Mg Tabs (Meloxicam) .... Once Daily 2)  Glucosamine Relief 1000 Mg Tabs (Glucosamine Sulfate) .Marland Kitchen.. 1 Two Times A Day 3)  B-100 Balanced Tr   Tbcr (B Complex-Folic Acid) .... Once Daily 4)  Niacinamide 500 Mg Tabs (Niacinamide) .... 2 Tablets Once Daily 5)  Finasteride 5 Mg Tabs (Finasteride) .... 1/4 Tab Once Daily 6)  Milk Thistle 500 Mg  Caps (Milk Thistle) .... 2 Once Daily 7)  Omeprazole 20 Mg  Cpdr (Omeprazole) .... Once Daily 8)  Ra Flax Seed Oil 1000 1000 Mg  Caps (Flaxseed (Linseed)) .... Three Times A Day 9)  Bisoprolol Fumarate 5 Mg Tabs (Bisoprolol Fumarate) .... 1/2 Tablet By Mouth Daily 10)  Warfarin Sodium 5 Mg Tabs (Warfarin Sodium) .... Use As Directed By Anticoagulation Clinic 11)  Micardis 80 Mg Tabs (Telmisartan) .Marland Kitchen.. 1 Once Daily 12)  Furosemide 20 Mg  Tabs (Furosemide) .Marland Kitchen.. 1 Once Daily 13)  Amlodipine Besylate 5 Mg Tabs (Amlodipine Besylate) .... Take One Tablet Once Daily 14)  Propafenone Hcl 225 Mg Tabs (Propafenone Hcl) .Marland Kitchen.. 1 Two Times A Day 15)  Zolpidem Tartrate 10 Mg Tabs (Zolpidem Tartrate) .Marland Kitchen.. 1 As Needed Sleep  Current Medications (verified): 1)  Meloxicam 15 Mg Tabs (Meloxicam) .... Once Daily 2)  Glucosamine Relief 1000 Mg Tabs (Glucosamine Sulfate) .Marland Kitchen.. 1 Two Times A Day 3)  B-100 Balanced Tr   Tbcr (B Complex-Folic Acid) .... Once Daily 4)  Niacinamide 500 Mg Tabs (Niacinamide) .... 2 Tablets Once Daily 5)  Finasteride 5 Mg Tabs (Finasteride) .... 1/4 Tab Once Daily 6)  Milk Thistle 500 Mg  Caps (Milk Thistle) .... 2 Once Daily 7)  Omeprazole 20 Mg  Cpdr (Omeprazole) .... Once Daily 8)  Ra Flax Seed Oil 1000 1000 Mg  Caps (Flaxseed (Linseed)) .... Three Times A Day 9)  Bisoprolol Fumarate 5 Mg Tabs (Bisoprolol Fumarate) .... 1/2  Tablet By Mouth Daily 10)  Warfarin Sodium 5 Mg Tabs (Warfarin Sodium) .... Use As Directed By Anticoagulation Clinic 11)  Micardis 80 Mg Tabs (Telmisartan) .Marland Kitchen.. 1 Once Daily 12)  Furosemide 20 Mg  Tabs (Furosemide) .Marland Kitchen.. 1 Once Daily 13)  Amlodipine Besylate 5 Mg Tabs (Amlodipine Besylate) .... Take One Tablet Once Daily 14)  Propafenone Hcl 225 Mg Tabs (Propafenone Hcl) .Marland Kitchen.. 1 Two Times A Day 15)  Silenor 6 Mg Tabs (Doxepin Hcl) .... One By Mouth  At Bedtime 16)  Alprazolam 0.5 Mg Tabs (Alprazolam) .... One By Mouth Q 6 Hour Prn  Allergies (verified): No Known Drug Allergies  Past History:  Family History: Last updated: 08/24/2007 Family History of Stroke M 1st degree relative <50 Family History Lung cancer  Social History: Last updated: 08/14/2008 Former Smoker Single--no children Retired--paper products Alcohol Use - no  Risk Factors: Smoking Status: quit (08/08/2009) Packs/Day: 1.0 (08/08/2009) Passive Smoke Exposure: no (08/08/2009)  Past medical, surgical, family and social histories (including risk factors) reviewed, and no changes noted (except as noted below).  Past Medical History: Reviewed history from 08/14/2008 and no changes required. Osteoarthritis Hypertension Gastroesophageal reflux disease New diagnosis of nonischemic cardiomyopathy Arthritis Cerebral aneurysm status post clipping Sinus bradycardia Paroxysmal atrial fibrillation Cardiomyopathy  Past Surgical History: Reviewed history from 02/04/2007 and no changes required. EDG-12/26/2005 Aurysm with clips Rotator cuff repair left Fracture arm left childhod Tonsillectomy Inguinal herniorrhaphy bilateral tendon repair left  Family History: Reviewed history from 08/24/2007 and no changes required. Family History of Stroke M 1st degree relative <50 Family History Lung cancer  Social History: Reviewed history from 08/14/2008 and no changes required. Former Smoker Single--no  children Retired--paper products Alcohol Use - no  Review of Systems  The patient denies anorexia, fever, weight loss, weight gain, vision loss, decreased hearing, hoarseness, chest pain, syncope, dyspnea on exertion, peripheral edema, prolonged cough, headaches, hemoptysis, abdominal pain, melena, hematochezia, severe indigestion/heartburn, hematuria, incontinence, genital sores, muscle weakness, suspicious skin lesions, transient blindness, difficulty walking, depression, unusual weight change, abnormal bleeding, enlarged lymph nodes, angioedema, and breast masses.    Physical Exam  General:  The patient was alert and oriented in no acute distress.Neck veins were flat, carotids were brisk. Lungs were clear. Heart sounds were regular without murmurs or gallops. Abdomen was soft with active bowel sounds. There is no clubbing cyanosis or edema.  Head:  normocephalic.  male-pattern balding.   Ears:  External ear exam shows no significant lesions or deformities.  Otoscopic examination reveals clear canals, tympanic membranes are intact bilaterally without bulging, retraction, inflammation or discharge. Hearing is grossly normal bilaterally. Nose:  no external erythema.  no nasal discharge.   Neck:  Neck supple, no JVD. No masses, thyromegaly or abnormal cervical nodes. Lungs:  Clear bilaterally to auscultation and percussion. Heart:  Non-displaced PMI,  chest non-tender; regular rate and rhythm, S1, S2 without murmurs, rubs or gallops. Carotid upstroke normal, no bruit. Normal abdominal aortic size, no bruits.  Abdomen:  soft and non-tender.  no distention, no masses, and no guarding.   Extremities:  No clubbing, cyanosis, edema, or deformity noted with normal full range of motion of all joints.   Neurologic:  No cranial nerve deficits noted. Station and gait are normal. Plantar reflexes are down-going bilaterally. DTRs are symmetrical throughout. Sensory, motor and coordinative functions appear  intact.   Impression & Recommendations:  Problem # 1:  HYPERTENSION (ICD-401.9)  His updated medication list for this problem includes:    Bisoprolol Fumarate 5 Mg Tabs (Bisoprolol fumarate) .Marland Kitchen... 1/2 tablet by mouth daily    Micardis 80 Mg Tabs (Telmisartan) .Marland Kitchen... 1 once daily    Furosemide 20 Mg Tabs (Furosemide) .Marland Kitchen... 1 once daily    Amlodipine Besylate 5 Mg Tabs (Amlodipine besylate) .Marland Kitchen... Take one tablet once daily  BP today: 116/78 Prior BP: 118/90 (07/05/2009)  Prior 10 Yr Risk Heart Disease: Not enough information (08/24/2007)  Labs Reviewed: K+: 4.6 (08/01/2009) Creat: : 0.7 (08/01/2009)   Chol: 218 (04/05/2009)   HDL: 74.00 (04/05/2009)   LDL: DEL (08/24/2007)   TG: 53.0 (04/05/2009)  Problem # 2:  HYPERKALEMIA (ICD-276.7)  Problem # 3:  ING HERN W/O MENTION OBST/GANGREN UNILAT/UNSPEC (ICD-550.90)  referral to [facility], or [facility]  for hernia on left  Orders: Surgical Referral (Surgery)  Problem # 4:  HYPONATREMIA (ICD-276.1) liberlize salt and moniter b met stable from 1//2011 moniter b met  Problem # 5:  TRANSIENT DISORDER INITIATING/MAINTAINING SLEEP (ICD-307.41) silenor to replace the ambien  Complete Medication List: 1)  Meloxicam 15 Mg Tabs (Meloxicam) .... Once daily 2)  Glucosamine Relief 1000 Mg Tabs (Glucosamine sulfate) .Marland Kitchen.. 1 two times a day 3)  B-100 Balanced Tr Tbcr (B complex-folic acid) .... Once daily 4)  Niacinamide 500 Mg Tabs (Niacinamide) .... 2 tablets once daily 5)  Finasteride 5 Mg Tabs (Finasteride) .... 1/4 tab once daily 6)  Milk Thistle 500 Mg Caps (Milk thistle) .... 2 once daily 7)  Omeprazole 20 Mg Cpdr (Omeprazole) .... Once daily 8)  Ra Flax Seed Oil 1000 1000 Mg Caps (Flaxseed (linseed)) .... Three times a day 9)  Bisoprolol Fumarate 5 Mg Tabs (Bisoprolol fumarate) .... 1/2 tablet by mouth daily 10)  Warfarin Sodium 5 Mg Tabs (Warfarin sodium) .... Use as directed by anticoagulation clinic 11)  Micardis 80 Mg Tabs  (Telmisartan) .Marland Kitchen.. 1 once daily 12)  Furosemide 20 Mg Tabs (Furosemide) .Marland Kitchen.. 1 once daily 13)  Amlodipine Besylate 5 Mg Tabs (Amlodipine besylate) .... Take one tablet once daily 14)  Propafenone Hcl 225 Mg Tabs (Propafenone hcl) .Marland Kitchen.. 1 two times a day 15)  Silenor 6 Mg Tabs (Doxepin hcl) .... One by mouth  at bedtime 16)  Alprazolam 0.5 Mg Tabs (Alprazolam) .... One by mouth q 6 hour prn  Patient Instructions: 1)  Please schedule a follow-up appointment in 2 months. 2)  BMP prior to visit, ICD-9: 276.1 Prescriptions: ALPRAZOLAM 0.5 MG TABS (ALPRAZOLAM) one by mouth q 6 hour prn  #30 x 0   Entered and Authorized by:   Stacie Glaze MD   Signed by:   Stacie Glaze MD on 08/08/2009   Method used:   Print then Give to Patient   RxID:   [id number]

## 2010-06-25 NOTE — Progress Notes (Signed)
Summary: heart out of rhythm   Phone Note Call from Patient Call back at Home Phone [phone]   Caller: Patient Reason for Call: Talk to Nurse, Talk to Doctor Summary of Call: pt heart is out of rhythm no appt with md in a timely manner Initial call taken by: Omer Jack,  June 14, 2009 9:52 AM  Follow-up for Phone Call        Spoke with pt who reports heart has felt out of rhythm since Monday AM. Does not know heart rate. No symptoms other than increased fatigue especially when exercising.  He is taking all medicines as prescribed.  I gave pt the option of coming in for an EKG today but he would prefer to wait for an appt with Dr. Graciela Husbands. Pt added on to Dr. Odessa Fleming schdule for January 27 at 9:00.  I instructed pt not to exercise until seen by Dr. Graciela Husbands and to call us or go to ED if he becomes symptomatic.  Will forward message to Dr. Graciela Husbands and Shawna Orleans. Follow-up by: Dossie Arbour, RN, BSN,  June 14, 2009 10:37 AM

## 2010-06-27 NOTE — Assessment & Plan Note (Signed)
Summary: pt//ccm   Nurse Visit   Allergies: No Known Drug Allergies Laboratory Results   Blood Tests      INR: 2.1   (Normal Range: 0.88-1.12   Therap INR: 2.0-3.5) Comments: Rita Ohara  June 05, 2010 1:54 PM     Orders Added: 1)  Est. Patient Level I [99211] 2)  Protime [[id number]]   ANTICOAGULATION RECORD PREVIOUS REGIMEN & LAB RESULTS Anticoagulation Diagnosis:  Atrial fibrillation on  12/14/2008 Previous INR Goal Range:  2.0-3.0 on  08/24/2007 Previous INR:  2.6 on  05/08/2010 Previous Coumadin Dose(mg):  7.5mg  on tue,thu,sat 5mg  on other days on  04/10/2010 Previous Regimen:  same on  05/08/2010 Previous Coagulation Comments:  Dr. Kirtland Bouchard approved on  06/28/2009  NEW REGIMEN & LAB RESULTS Current INR: 2.1 Regimen: same  Repeat testing in: 4 weeks  Anticoagulation Visit Questionnaire Coumadin dose missed/changed:  No Abnormal Bleeding Symptoms:  No  Any diet changes including alcohol intake, vegetables or greens since the last visit:  No Any illnesses or hospitalizations since the last visit:  No Any signs of clotting since the last visit (including chest discomfort, dizziness, shortness of breath, arm tingling, slurred speech, swelling or redness in leg):  No  MEDICATIONS OMEPRAZOLE 20 MG  CPDR (OMEPRAZOLE) once daily MELOXICAM 15 MG TABS (MELOXICAM) once daily GLUCOSAMINE RELIEF 1000 MG TABS (GLUCOSAMINE SULFATE) 1 two times a day B-100 BALANCED TR   TBCR (B COMPLEX-FOLIC ACID) once daily NIACINAMIDE 500 MG TABS (NIACINAMIDE) 2 tablets once daily FINASTERIDE 5 MG TABS (FINASTERIDE) 1/4 tab once daily MILK THISTLE 500 MG  CAPS (MILK THISTLE) 2 once daily RA FLAX SEED OIL 1000 1000 MG  CAPS (FLAXSEED (LINSEED)) three times a day MICARDIS 40 MG TABS (TELMISARTAN) one by mouth daily WARFARIN SODIUM 5 MG TABS (WARFARIN SODIUM) Use as directed by Anticoagulation Clinic FUROSEMIDE 20 MG TABS (FUROSEMIDE) one by mouth daily AMLODIPINE BESYLATE 5 MG TABS  (AMLODIPINE BESYLATE) take one tablet once daily PROPAFENONE HCL 225 MG TABS (PROPAFENONE HCL) Take 1 and 1/2 tablets twice daily ALPRAZOLAM 0.5 MG TABS (ALPRAZOLAM) one by mouth q 6 hour prn DOXEPIN HCL 10 MG CAPS (DOXEPIN HCL) 1 at bedtime

## 2010-06-27 NOTE — Assessment & Plan Note (Signed)
Summary: pt labs//ccm PT [facility]   Nurse Visit   Allergies: No Known Drug Allergies Laboratory Results   Blood Tests      INR: 2.6   (Normal Range: 0.88-1.12   Therap INR: 2.0-3.5) Comments: Rita Ohara  May 08, 2010 3:29 PM     Orders Added: 1)  Est. Patient Level I [99211] 2)  Protime [[id number]]   ANTICOAGULATION RECORD PREVIOUS REGIMEN & LAB RESULTS Anticoagulation Diagnosis:  Atrial fibrillation on  12/14/2008 Previous INR Goal Range:  2.0-3.0 on  08/24/2007 Previous INR:  1.8 on  04/10/2010 Previous Coumadin Dose(mg):  7.5mg  on tue,thu,sat 5mg  on other days on  04/10/2010 Previous Regimen:  Same Dose on  04/10/2010 Previous Coagulation Comments:  Dr. Kirtland Bouchard approved on  06/28/2009  NEW REGIMEN & LAB RESULTS Current INR: 2.6 Regimen: same  Repeat testing in: 4 weeks  Anticoagulation Visit Questionnaire Coumadin dose missed/changed:  No Abnormal Bleeding Symptoms:  No  Any diet changes including alcohol intake, vegetables or greens since the last visit:  No Any illnesses or hospitalizations since the last visit:  No Any signs of clotting since the last visit (including chest discomfort, dizziness, shortness of breath, arm tingling, slurred speech, swelling or redness in leg):  No  MEDICATIONS OMEPRAZOLE 20 MG  CPDR (OMEPRAZOLE) once daily MELOXICAM 15 MG TABS (MELOXICAM) once daily GLUCOSAMINE RELIEF 1000 MG TABS (GLUCOSAMINE SULFATE) 1 two times a day B-100 BALANCED TR   TBCR (B COMPLEX-FOLIC ACID) once daily NIACINAMIDE 500 MG TABS (NIACINAMIDE) 2 tablets once daily FINASTERIDE 5 MG TABS (FINASTERIDE) 1/4 tab once daily MILK THISTLE 500 MG  CAPS (MILK THISTLE) 2 once daily RA FLAX SEED OIL 1000 1000 MG  CAPS (FLAXSEED (LINSEED)) three times a day MICARDIS 40 MG TABS (TELMISARTAN) one by mouth daily WARFARIN SODIUM 5 MG TABS (WARFARIN SODIUM) Use as directed by Anticoagulation Clinic FUROSEMIDE 20 MG TABS (FUROSEMIDE) one by mouth daily AMLODIPINE  BESYLATE 5 MG TABS (AMLODIPINE BESYLATE) take one tablet once daily PROPAFENONE HCL 225 MG TABS (PROPAFENONE HCL) Take 1 and 1/2 tablets twice daily ALPRAZOLAM 0.5 MG TABS (ALPRAZOLAM) one by mouth q 6 hour prn DOXEPIN HCL 10 MG CAPS (DOXEPIN HCL) 1 at bedtime

## 2010-07-08 ENCOUNTER — Ambulatory Visit (INDEPENDENT_AMBULATORY_CARE_PROVIDER_SITE_OTHER): Payer: BLUE CROSS/BLUE SHIELD | Admitting: Internal Medicine

## 2010-07-08 ENCOUNTER — Other Ambulatory Visit: Payer: Self-pay

## 2010-07-08 DIAGNOSIS — I4891 Unspecified atrial fibrillation: Secondary | ICD-10-CM

## 2010-07-25 ENCOUNTER — Encounter: Payer: Self-pay | Admitting: Internal Medicine

## 2010-07-26 ENCOUNTER — Encounter (INDEPENDENT_AMBULATORY_CARE_PROVIDER_SITE_OTHER): Payer: Medicare Other

## 2010-07-26 ENCOUNTER — Encounter: Payer: Self-pay | Admitting: Internal Medicine

## 2010-07-26 DIAGNOSIS — I6529 Occlusion and stenosis of unspecified carotid artery: Secondary | ICD-10-CM

## 2010-08-01 NOTE — Miscellaneous (Signed)
Summary: Orders Update  Clinical Lists Changes  Orders: Added new Test order of Carotid Duplex (Carotid Duplex) - Signed 

## 2010-08-04 ENCOUNTER — Other Ambulatory Visit: Payer: Self-pay | Admitting: Internal Medicine

## 2010-08-07 ENCOUNTER — Other Ambulatory Visit: Payer: Medicare Other | Admitting: Internal Medicine

## 2010-08-07 DIAGNOSIS — I4891 Unspecified atrial fibrillation: Secondary | ICD-10-CM

## 2010-08-07 DIAGNOSIS — I10 Essential (primary) hypertension: Secondary | ICD-10-CM

## 2010-08-07 LAB — BASIC METABOLIC PANEL
CO2: 25 mEq/L (ref 19–32)
Calcium: 8.4 mg/dL (ref 8.4–10.5)
Creatinine, Ser: 0.7 mg/dL (ref 0.4–1.5)
Glucose, Bld: 61 mg/dL — ABNORMAL LOW (ref 70–99)

## 2010-08-11 LAB — COMPREHENSIVE METABOLIC PANEL
AST: 27 U/L (ref 0–37)
BUN: 10 mg/dL (ref 6–23)
CO2: 23 mEq/L (ref 19–32)
Calcium: 9 mg/dL (ref 8.4–10.5)
Creatinine, Ser: 0.81 mg/dL (ref 0.4–1.5)
GFR calc Af Amer: >60 mL/min (ref >60)
GFR calc non Af Amer: >60 mL/min (ref >60)

## 2010-08-11 LAB — POCT CARDIAC MARKERS
CKMB, poc: <1 ng/mL — ABNORMAL LOW (ref 1.0–8.0)
Myoglobin, poc: 51 ng/mL (ref 12–200)
Myoglobin, poc: 63.8 ng/mL (ref 12–200)
Troponin i, poc: <0.05 ng/mL (ref 0.00–0.09)
Troponin i, poc: <0.05 ng/mL (ref 0.00–0.09)

## 2010-08-11 LAB — APTT: aPTT: 37 seconds (ref 24–37)

## 2010-08-11 LAB — CARDIAC PANEL(CRET KIN+CKTOT+MB+TROPI)
CK, MB: 2.3 ng/mL (ref 0.3–4.0)
Total CK: 141 U/L (ref 7–232)
Troponin I: 0.03 ng/mL (ref 0.00–0.06)

## 2010-08-11 LAB — CBC
MCHC: 33.9 g/dL (ref 30.0–36.0)
MCV: 106.1 fL — ABNORMAL HIGH (ref 78.0–100.0)
Platelets: 194 10*3/uL (ref 150–400)
RBC: 4.37 MIL/uL (ref 4.22–5.81)

## 2010-08-11 LAB — BASIC METABOLIC PANEL
Calcium: 8.2 mg/dL — ABNORMAL LOW (ref 8.4–10.5)
Creatinine, Ser: 0.76 mg/dL (ref 0.4–1.5)
GFR calc Af Amer: >60 mL/min (ref >60)
Sodium: 124 mEq/L — ABNORMAL LOW (ref 135–145)

## 2010-08-11 LAB — URINALYSIS, ROUTINE W REFLEX MICROSCOPIC
Glucose, UA: NEGATIVE mg/dL
Hgb urine dipstick: NEGATIVE
Protein, ur: NEGATIVE mg/dL
Specific Gravity, Urine: 1.011 (ref 1.005–1.030)
Urobilinogen, UA: 1 mg/dL (ref 0.0–1.0)

## 2010-08-11 LAB — DIFFERENTIAL
Eosinophils Relative: 1 % (ref 0–5)
Lymphocytes Relative: 13 % (ref 12–46)
Lymphs Abs: 0.9 10*3/uL (ref 0.7–4.0)
Neutro Abs: 5.2 10*3/uL (ref 1.7–7.7)

## 2010-08-11 LAB — CK TOTAL AND CKMB (NOT AT ARMC): Total CK: 166 U/L (ref 7–232)

## 2010-08-11 LAB — TROPONIN I: Troponin I: 0.03 ng/mL (ref 0.00–0.06)

## 2010-08-11 LAB — PROTIME-INR: Prothrombin Time: 23 seconds — ABNORMAL HIGH (ref 11.6–15.2)

## 2010-08-13 ENCOUNTER — Encounter: Payer: Self-pay | Admitting: Internal Medicine

## 2010-08-14 ENCOUNTER — Ambulatory Visit (INDEPENDENT_AMBULATORY_CARE_PROVIDER_SITE_OTHER): Payer: Medicare Other | Admitting: Internal Medicine

## 2010-08-14 ENCOUNTER — Encounter: Payer: Self-pay | Admitting: Internal Medicine

## 2010-08-14 VITALS — BP 124/78 | HR 68 | Temp 98.4°F | Resp 14 | Ht 68.5 in | Wt 180.0 lb

## 2010-08-14 DIAGNOSIS — I4891 Unspecified atrial fibrillation: Secondary | ICD-10-CM

## 2010-08-14 DIAGNOSIS — K117 Disturbances of salivary secretion: Secondary | ICD-10-CM

## 2010-08-14 DIAGNOSIS — E871 Hypo-osmolality and hyponatremia: Secondary | ICD-10-CM

## 2010-08-14 DIAGNOSIS — I10 Essential (primary) hypertension: Secondary | ICD-10-CM

## 2010-08-14 NOTE — Progress Notes (Signed)
  Subjective:    Patient ID: Travis [name], male    DOB: [birth year], 70 y.o.   MRN: [id number]  HPI  patient is a 70 year old white male who presents for followup of her hyponatremia hyperkalemia hypertension and atrial fibrillation he has had a thorough workup in the past for his hyponatremia and we believe it to be secondary to water intoxication he admits to extremely dry mouth and drinking "volumes of water" he has noted no runs of atrial fibrillation feels like his heart has been in rhythm denies any chest pain shortness of breath PND orthopnea he is compliant with his medications   Review of Systems  Constitutional: Negative for fever and fatigue.  HENT: Negative for hearing loss, congestion, neck pain and postnasal drip.   Eyes: Negative for discharge, redness and visual disturbance.  Respiratory: Negative for cough, shortness of breath and wheezing.   Cardiovascular: Negative for leg swelling.  Gastrointestinal: Negative for abdominal pain, constipation and abdominal distention.  Genitourinary: Negative for urgency and frequency.  Musculoskeletal: Negative for joint swelling and arthralgias.  Skin: Negative for color change and rash.  Neurological: Negative for weakness and light-headedness.  Hematological: Negative for adenopathy.  Psychiatric/Behavioral: Negative for behavioral problems.       Past Medical History  Diagnosis Date  . Arthritis   . Hypertension   . GERD (gastroesophageal reflux disease)   . Cardiomyopathy, nonischemic   . Status post clamping of cerebral aneurysm   . Sinus bradycardia   . Paroxysmal atrial fibrillation    Past Surgical History  Procedure Date  . Cerebral anuersym post clips   . Rotator cuff repair   . Fractured left arm   . Tonsillectomy   . Hernia repair   . Left tendon repair     reports that he has quit smoking. He does not have any smokeless tobacco history on file. He reports that he does not drink alcohol or use illicit  drugs. family history includes Cancer in an unspecified family member; Lung cancer in an unspecified family member; and Stroke in his father. No Known Allergies  Objective:     Physical Exam  Constitutional: He is oriented to person, place, and time. He appears well-developed and well-nourished.  HENT:  Head: Normocephalic and atraumatic.  Eyes: Conjunctivae are normal. Pupils are equal, round, and reactive to light.  Neck: Normal range of motion. Neck supple.  Cardiovascular: Normal rate and regular rhythm.   Pulmonary/Chest: Effort normal and breath sounds normal.  Abdominal: Soft. Bowel sounds are normal.  Musculoskeletal: Normal range of motion. He exhibits no edema.  Neurological: He is alert and oriented to person, place, and time.  Skin: Skin is warm and dry.  Psychiatric: He has a normal mood and affect. His behavior is normal.           Assessment & Plan:   the patient has chronic atrial fibrillation he is in sinus rhythm with his antiarrhythmic drug. He is tolerating the medication well without side effects his blood pressure is well controlled on Micardis and his sodium levels are stable  At a low level.  We discussed continued efforts to liberalize salt in his diet as well as 2 monitor his water consumption limiting at 1.5 L a day. This has been successful in passing raising his sodium level he has an INR scheduled for 2 weeks and has a Medicare wellness examination scheduled for 5 months

## 2010-08-21 ENCOUNTER — Other Ambulatory Visit: Payer: Self-pay

## 2010-08-21 DIAGNOSIS — I10 Essential (primary) hypertension: Secondary | ICD-10-CM

## 2010-08-21 MED ORDER — AMLODIPINE BESYLATE 5 MG PO TABS: 5.0000 mg | ORAL_TABLET | Freq: Every day | ORAL | Status: DC

## 2010-09-06 ENCOUNTER — Other Ambulatory Visit (INDEPENDENT_AMBULATORY_CARE_PROVIDER_SITE_OTHER): Payer: Medicare Other

## 2010-09-06 DIAGNOSIS — I4891 Unspecified atrial fibrillation: Secondary | ICD-10-CM

## 2010-09-06 NOTE — Patient Instructions (Signed)
Same dose 

## 2010-09-10 ENCOUNTER — Other Ambulatory Visit: Payer: Self-pay | Admitting: *Deleted

## 2010-09-10 MED ORDER — PROPAFENONE HCL ER 225 MG PO CP12: ORAL_CAPSULE | ORAL | Status: DC

## 2010-09-25 ENCOUNTER — Ambulatory Visit (INDEPENDENT_AMBULATORY_CARE_PROVIDER_SITE_OTHER): Payer: Medicare Other | Admitting: Internal Medicine

## 2010-09-25 DIAGNOSIS — Z2911 Encounter for prophylactic immunotherapy for respiratory syncytial virus (RSV): Secondary | ICD-10-CM

## 2010-09-25 DIAGNOSIS — Z0000 Encounter for general adult medical examination without abnormal findings: Secondary | ICD-10-CM

## 2010-10-03 ENCOUNTER — Other Ambulatory Visit: Payer: Self-pay | Admitting: Internal Medicine

## 2010-10-08 ENCOUNTER — Ambulatory Visit (INDEPENDENT_AMBULATORY_CARE_PROVIDER_SITE_OTHER): Payer: Medicare Other | Admitting: Internal Medicine

## 2010-10-08 DIAGNOSIS — I4891 Unspecified atrial fibrillation: Secondary | ICD-10-CM

## 2010-10-08 LAB — POCT INR: INR: 3

## 2010-10-08 NOTE — Op Note (Signed)
NAMEQUADRY, KAMPA NO.:  [id number]   MEDICAL RECORD NO.:  [id number]          PATIENT TYPE:  OIB   LOCATION:  2899                         FACILITY:  [facility]   PHYSICIAN:  Hillis Range, MD       DATE OF BIRTH:  07-21-1940   DATE OF PROCEDURE:  08/22/2008  DATE OF DISCHARGE:                               OPERATIVE REPORT   PREPROCEDURE DIAGNOSIS:  Typical-appearing atrial flutter.   POSTPROCEDURE DIAGNOSES:  Typical-appearing atrial flutter.   PROCEDURE:  Cardioversion.   HISTORY OF PRESENT ILLNESS:  Mr. Yusko is a pleasant 70 year old  gentleman with a known history of atrial fibrillation and atrial flutter  who presents today for cardioversion.  He was recently initiated on  Rythmol.  He has a long history of therapeutic INRs with Coumadin.  He  presents today for cardioversion.   DESCRIPTION OF PROCEDURE:  Informed written consent was obtained and the  patient was brought to the short-stay area where he was documented to  have typical-appearing atrial flutter with ventricular rates in the 140-  beat per minute range.  Adequate IV access in airway support were  assured.  The patient was then sedated by anesthesia as outlined in the  report.  The patient was then successfully cardioverted to sinus rhythm  with a single synchronized 200-joule biphasic shock with cardioversion  electrodes in the anterior-posterior thoracic configuration.  He  remained in sinus rhythm thereafter.  There were no early apparent  complications.   CONCLUSIONS:  1. Typical-appearing atrial flutter upon presentation.  2. Successful cardioversion to sinus rhythm.  3. No early apparent complications.      Hillis Range, MD  Electronically Signed     JA/MEDQ  D:  08/22/2008  T:  08/23/2008  Job:  [id number]   cc:   Duke Salvia, MD, [facility]  Dr. [name]

## 2010-10-08 NOTE — Letter (Signed)
December 07, 2007    Stacie Glaze, MD  [address] [place], [place] [address]   RE:  Travis [name], Travis [name]  MRN:  [id number]  /  DOB:  [birth year]   Dear [name],   Travis [name] comes in following cardioversion of his atrial fibrillation  and for reassessment of his what we hoped was tachycardia-induced  cardiomyopathy.  An echo was done this morning.  The preliminary  demonstrates what I think is a significant improvement in his LV  function, it is now described as mild.  Hopefully, that will translate  into the 40-50% range when it is read later today compared to the 20-25%  range.  This had huge prognostic implications as you know.   He is tolerating the reintroduction of Micardis at 40 mg a day.  He is  also on furosemide.  He is not on a beta-blocker because of bradycardia.   On examination, today his blood pressure is 165/97, but he remains  bradycardiac at 48.  His lungs were clear.  His heart sounds were  regular.  Extremities were without edema.  His echo preliminarily is as  noted above.   IMPRESSION:  1. Atrial fibrillation - paroxysmal, holding sinus rhythm.  2. Hypertension.  3. Left ventricular dysfunction with a significant improvement for      bradycardia.   Travis [name], Travis [name], is doing really quite well.  He is to see you in the  next 10 days or so.  I have asked him to get a couple of outpatient  blood pressures between now and then.  If his blood  pressure remains of about 120, I have asked you to increase his Micardis  when you see him.  I think given his bradycardia we are not going to be  able to use a beta-blocker unfortunately.   We will plan to see him again in 6 months' time.  I have demonstrated  him how to take track of his pulse and he is to let us know if he goes  out of rhythm again.    Sincerely,      Duke Salvia, MD, [facility]  Electronically Signed    SCK/MedQ  DD: 12/07/2007  DT: 12/08/2007  Job #: [id number]

## 2010-10-08 NOTE — Letter (Signed)
September 22, 2007    Travis Glaze, MD  [address] [place], [place] [address]   RE:  [name], [name]  MRN:  [id number]  /  DOB:  1940/11/05   Dear Travis [name]:   It was a pleasure to see Travis [name] at your request today. As you know,  you had identified atrial fibrillation and set him up for a stress test  which was notable for significant LV dysfunction with ejection fraction  of 20-25% which was subsequently confirmed by echo.  He was found to be  in atrial fibrillation with a relatively rapid ventricular response.  Because of the abnormalities on his stress, he was seen by Dr. Eden [name],  who initiated him on Coumadin therapy with anticipation of cardioversion  with the appropriate delaying of evaluation of coronary disease to  following restoration of sinus rhythm.  He has a history of PVCs.   Looking backward, the patient has noted some increasing problems with  shortness of breath particularly climbing inclines. He is quite fit  working out with a Psychologist, educational. He is not walking as vigorously as he was  which was about three miles in 45 minutes.  He has not had peripheral  edema, nocturnal dyspnea or orthopnea.   He has had no syncope.   His thromboembolic risk factors are notable for hypertension and  congestive heart failure.   His family history is noncontributory.   His review of systems is notable for GE reflux disease and peptic ulcer  disease as well as arthritis. Palpitations consistent with PVCs.   His past surgical history is notable for aneurysm with clips in his  head, rotator cuff surgery in the left, tonsillectomy, left tendon foot  surgery, vein stripping in the left leg and previous fracture of the  left arm.   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  He is single.  He has no children.  He does not use cigarettes or recreational drugs.  He does drink alcohol 2 to 3 alcoholic beverages a day.   CURRENT MEDICATIONS:  1. Meloxicam 15.  2.  Metoprolol 25 b.i.d.  3. Azor 5/20.  4. Aspirin.  5. Coumadin.  6. Variety of nutraceuticals.   ALLERGIES:  He has no known drug allergies.   On examination today, Travis [name], his blood pressure was 132/89 with a pulse  of 105.  He was in no acute distress.  His HEENT exam demonstrated no icterus or xanthoma.  His neck veins were  surprisingly flat.  His carotids were brisk. BACK:  Without kyphosis or  scoliosis.  LUNGS:  Clear.  His heart sounds were rapid and irregular.  There is a 2/6 murmur heard  at the apex.  The abdomen was soft with active bowel sounds without midline pulsation.  Femoral pulses were 2+. Distal pulses were intact.  There is no  clubbing, cyanosis or edema.  Neurological exam was grossly normal.  His skin was warm and dry.   Electrocardiogram dated today demonstrated atrial fibrillation 104 with  intervals 0.10/0.30.  The axis was leftward at -30.  There is some  suggestion of a flutter like wave, but it was inconsistently evident.  There was an isolated PVC on the electrocardiogram.   Echo demonstrated left ventricular dysfunction with ejection fraction of  20-25%, diffuse left ventricular hypokinesis.  There was moderate mitral  regurgitation with a left atrial dimension of 46 mm and mild right  atrial enlargement as well.   IMPRESSION:  1.  Atrial fibrillation with a poorly controlled ventricular response.  2. Cardiomyopathy.  Hopefully, nonischemic and tachycardia mediated      with secondary mitral regurgitation and bi-atrial enlargement.  3. Thromboembolic risk factors notable for hypertension and congestive      heart failure/LV dysfunction.  4. Palpitations likely consistent with PVCs.  5. History of peptic ulcer disease.   Travis [name], Travis [name] has a cardiomyopathy that is quite severe that  hopefully is related to his tachycardia.  He has atrial fibrillation  which is largely asymptomatic but hopefully is contributing to the  myopathy as noted.  If  that is true, the essential therapy is getting  control of his ventricular rate.  The easiest way to do that is to is to  cardiovert him.   I look forward to hearing from your office tomorrow about his INRs and  we will proceed either with transesophageal guided cardioversion or 3  weeks of pretreatment guided depending on how quickly we get to the  latter.   In addition, I have elected to change his medications.  There is no data  of which I am aware for angiotensin blockers for cardiomyopathy.  I have  elected to put him on Lotensin at 20 mg a day with up titration as  needed.  In addition, I have added bisoprolol to his Lopressor at 5 mg a  day choosing bisoprolol because it has better rate control properties I  think than does carvedilol.   I have gone over this at great length with Travis [name].  We have given  him some information from the American [employer] Association on atrial  fibrillation.  I think he was quite shaken by the reality of his quite  severe disease.   Thanks very much for asking me to see him.    Sincerely,      Travis Salvia, MD, [facility]  Electronically Signed    SCK/MedQ  DD: 09/22/2007  DT: 09/22/2007  Job #: [id number]

## 2010-10-08 NOTE — Discharge Summary (Signed)
NAMEMarland Kitchen  SANCHEZ, [name] NO.:  [id number]   MEDICAL RECORD NO.:  [id number]          PATIENT TYPE:  INP   LOCATION:  2007                         FACILITY:  [facility]   PHYSICIAN:  Jonelle Sidle, MD DATE OF BIRTH:  10-10-1940   DATE OF ADMISSION:  10/07/2007  DATE OF DISCHARGE:  10/10/2007                               DISCHARGE SUMMARY   PROCEDURES:  1. Transthoracic echocardiogram.  2. Transesophageal echocardiogram.  3. Direct current cardioversion.   PRIMARY AND FINAL DISCHARGE DIAGNOSIS:  Atrial fibrillation.   SECONDARY DIAGNOSES:  1. Cardiomyopathy with an ejection fraction of 20%-25% by      echocardiogram this admission.  2. Hypertension.  3. Gastroesophageal reflux disease.  4. History of cerebral aneurysm with clipping.   TIME AT DISCHARGE:  42 minutes.   HOSPITAL COURSE:  Travis [name] is a 70 year old male with a nonischemic  cardiomyopathy.  Dr. Graciela Husbands saw Travis [name] and arranged for him to be  scheduled for a TEE guided cardioversion.   His INR was subtherapeutic at 1.8, so he was placed on Coumadin and  Heparin with a TEE cardioversion planned.  This was performed on Oct 08, 2007.  There was no left atrial clot seen.  He had direct current  cardioversion with 200 joules 1 shock to sinus rhythm at 60.   He remained in the hospital until his INR was therapeutic.  By Oct 10, 2007, his INR was 2.2.  He was slightly hyponatremic with a sodium of  131, but this will be followed.  His volume status was at baseline.  A  TSH was within normal limits.  BUN and creatinine were stable.  On Oct 10, 2007, Travis [name] was seen by Dr. Diona Browner who felt that he was  stable for discharge.   DISCHARGE INSTRUCTIONS:  1. His activity level is to be increased gradually.  2. He is to weigh himself daily.  3. He is to use over-the-counter steroid of Benadryl cream to the      cardioversion sites as needed.  4. He is to follow up with Dr. Graciela Husbands in 2-3 weeks and  Dr. Eden Emms in      approximately 3 months.  He is to keep his appointment with Dr.      Lovell Sheehan as scheduled and is to get a Coumadin check at Dr. Lovell Sheehan'      office this week.   DISCHARGE MEDICATIONS:  1. Omeprazole 20 mg daily.  2. Meloxicam 15 mg a day.  3. B complex and flaxseed daily.  4. Propecia 1 mg daily.  5. Lasix 20 mg a day.  6. Niacinamide daily.  7. Aspirin 81 mg daily.  8. Coumadin 5 mg daily except for 2.5 mg on Sunday.  9. Bisoprolol 5 mg b.i.d.      [name], PA-C      Jonelle Sidle, MD  Electronically Signed    RB/MEDQ  D:  10/10/2007  T:  10/10/2007  Job:  [id number]   cc:   Della Goo, M.D.

## 2010-10-08 NOTE — Assessment & Plan Note (Signed)
 HEALTHCARE                         ELECTROPHYSIOLOGY OFFICE NOTE   Travis, [name]                        MRN:          [id number]  DATE:10/27/2007                            DOB:          [birth year]    Travis [name] is seen  following a hospitalization at which time he  underwent cardioversion for atrial fibrillation that was thought to  responsible for tachycardia-induced cardiomyopathy.  He was able to  maintain sinus rhythm in the absence of antiarrhythmic drug therapy.   He feels quite well.  He has no limitations.  He is going to the gym  three days a week, walking the other three days a week.   His medications include:  1. Niacin:  2. Aspirin.  3. Coumadin.  4. Furosemide 20.  5. Aspirin.  6. He is also on bisoprolol 10 mg a day.   On examination, his blood pressure today was 129/84, pulse was 46, his  weight was 184.  His neck veins were flat.  Her lungs were clear.  HEART:  Sounds were regular but slow.  There was no significant murmurs.  ABDOMEN:  Soft.  EXTREMITIES:  Without peripheral edema.   Electrocardiogram dated today demonstrated sinus rhythm of 46 with  intervals of 0.17/0.09/0.47.  The axis was leftward and -35.   IMPRESSION:  1. Paroxysmal atrial fibrillation.  2. Cardiomyopathy, hopefully related to the above when associated with      a rapid ventricular response.  3. Sinus bradycardia.   Travis [name] has a couple of issues.  I will plan to discontinue his  aspirin as I do not know that there is any particular benefit to adding  it to the warfarin.   We will decrease his bisoprolol giving his relative bradycardia, and  this may also allow [place] to add back an ARB, and I have asked him to take  Micardis at 40 mg a day and to track his blood pressure and to continue  along his blood pressure is above 95 and he is having no significant  symptoms.   We will plan to see him again six weeks' time, accompanied by an  echocardiogram to see if there is any improvement in his cardiomyopathy.     Duke Salvia, MD, [facility]  Electronically Signed    SCK/MedQ  DD: 10/27/2007  DT: 10/27/2007  Job #: [id number]   cc:   Stacie Glaze, MD

## 2010-10-08 NOTE — H&P (Signed)
NAMEMarland Kitchen  [name], Travis NO.:  [id number]   MEDICAL RECORD NO.:  [id number]          PATIENT TYPE:  INP   LOCATION:  2007                         FACILITY:  [facility]   PHYSICIAN:  Rollene Rotunda, MD, FACCDATE OF BIRTH:  05/08/1941   DATE OF ADMISSION:  10/07/2007  DATE OF DISCHARGE:                              HISTORY & PHYSICAL   PRIMARY:  Stacie Glaze, MD   CARDIOLOGIST:  Noralyn Pick. Eden Emms, MD   REASON FOR PRESENTATION:  The patient with atrial fibrillation and  cardiomyopathy.   HISTORY OF PRESENT ILLNESS:  The patient is a 70 year old white  gentleman noted recently to have atrial fibrillation.  He was referred  by Dr. Lovell Sheehan for a stress test.  On this study, he was found to have  an EF of 21%.  There was some thinning of the inferior wall but no  evidence of ischemia.  He has been managed on Coumadin.  He had a 2-D  echocardiogram demonstrating the EF again to be 20-25%.  He was seen by  Dr. Graciela Husbands.  The plan was to plan cardioversion.  The patient has  actually felt well through all of this.  He does exercise vigorously  working out with a Psychologist, educational.  He does have some mild dyspnea but overall  class I symptoms.  He does not have any PND or orthopnea.  He has not  really noticed any palpitations.  He has had no presyncope or syncope.  He has had no chest discomfort.   He was noted on his most recent blood work to have an INR that was  subtherapeutic at 1.8 yesterday.  Because of this and a BNP of 1001, he  is now admitted for IV anticoagulation and planned TEE-guided  cardioversion.  He has been treated with bisoprolol.  He is currently  not on an ACE inhibitor.  He is taking low dose of diuretic.   PAST MEDICAL HISTORY:  1. Hypertension.  2. Gastroesophageal reflux disease.  3. New diagnosis of nonischemic cardiomyopathy.  4. Arthritis.  5. Cerebral aneurysm status post clipping.   PAST SURGICAL HISTORY:  1. Left rotator cuff repair.  2. Left foot  tendon repair.  3. Vein stripping of the left lower extremity.   ALLERGIES INTOLERANCES:  None.   MEDICATIONS:  1. Bisoprolol 5 mg b.i.d.  2. Aspirin 81 mg daily.  3. Coumadin.  4. Meloxicam  5. Lasix 20 mg nightly.  6. Niacin 1000 mg nightly.  7. Omeprazole 20 mg daily.   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  He is single.  He has no children.  He does not smoke  cigarettes or drink alcohol.   FAMILY HISTORY:  Noncontributory for early coronary artery disease.   REVIEW OF SYSTEMS:  As stated in the HPI, and otherwise negative for  other systems.   PHYSICAL EXAMINATION:  The patient is very well-appearing and in no  distress.  Blood pressure 120/82, heart rate 97 and irregular, respiratory rate 18,  afebrile.  HEENT:  Eyelids are unremarkable, pupils equal, round,  and reactive to  light, fundi not visualized, oral mucosa unremarkable.  NECK:  No jugular distention 45 degrees, carotid upstroke brisk and  symmetrical.  No bruits, no thyromegaly.  LYMPHATICS:  No cervical, axillary, or inguinal adenopathy.  LUNGS:  Clear to auscultation bilaterally.  BACK:  No costovertebral angle tenderness.  CHEST:  Unremarkable.  HEART:  PMI not displaced or sustained, S1-S2 within normal limits.  No  S3, no S4, no clicks, rubs, no murmurs.  ABDOMEN:  Flat, positive bowel sounds, normal in frequency and pitch.  No bruits, no rebound, no guarding, no midline pulsatile mass.  No  hepatomegaly or splenomegaly.  SKIN:  No rashes.  EXTREMITIES:  Pulse 2+ throughout, no edema, cyanosis, or clubbing.  NEURO:  Oriented to person, place, and time, cranial nerves II through  XII are grossly intact, motor grossly intact throughout.   LABS:  BNP Oct 06, 2007, 623, sodium 130, potassium 4.9, BUN 15,  creatinine 1.0, INR 1.8.   Telemetry atrial fibrillation.   EKG pending.   ASSESSMENT/PLAN:  1. Atrial fibrillation.  The patient has atrial fibrillation with  a      rate that has been difficult to control.  This is probably the      etiology of his cardiomyopathy.  He is going to be put on heparin.      He is going to have his Coumadin adjusted by pharmacy.  Because he      has been subtherapeutic, now he will need a TEE-guided      cardioversion.  We will keep him n.p.o..  I will see if this has      been arranged yet and try to schedule it through the morning.  We      will make sure that labs have included a TSH.  2. Cardiomyopathy.  The patient has a cardiomyopathy presumed to be      nonischemic.  He is very asymptomatic despite this very reduced      ejection fraction.  He has been taking his Lasix at night so I am      not going to give him      this.  He can start p.o. Lasix in the morning.  He is going to      continue his bisoprolol.  We are going to start lisinopril at 10 mg      daily.  He can have his meds titrated further over time.  3. Hypertension is to be managed in the context of treating his      cardiomyopathy and atrial fibrillation.      Rollene Rotunda, MD, [facility]  Electronically Signed     JH/MEDQ  D:  10/07/2007  T:  10/08/2007  Job:  [id number]   cc:   Stacie Glaze, MD

## 2010-10-08 NOTE — Letter (Signed)
May 31, 2008    Stacie Glaze, MD  [address] [place], [place] [address]   RE:  Travis [name], Travis [name]  MRN:  [id number]  /  DOB:  1941-05-10   Dr. [name],   Travis [name] comes in today in followup for his tachycardia-induced  cardiomyopathy with atrial fibrillation.  When he was last echoed in  summer, his LV function had returned to normal.  He has been checking  his pulse regularly.  He has had no recurrent paroxysms of atrial  fibrillation.  He has had no symptoms of shortness of breath,  lightheadedness, or chest pain.   His medications currently include Micardis uptitrated from 40-80  apparently by you, furosemide 20.  He is also on Coumadin and Prilosec  as well as variety of vitamins.   On examination, his blood pressure today was 150/84, his pulse was 47,  which runs low as you know, his weight was 190, which is stable.  His  neck veins were flat.  His lungs were clear.  His heart sounds were  regular without murmurs.  His abdomen was soft.  Femoral pulses were 2+.  Distal pulses were intact.  There was no clubbing, cyanosis, or edema.   Electrocardiogram dated today demonstrated sinus rhythm of 47 with  intervals of 0.16/0.10/0.46, the axis was mildly leftward at -17, the  rhythm was otherwise regular and electrocardiogram was otherwise  unremarkable.   IMPRESSION:  1. Paroxysmal atrial fibrillation - holding sinus rhythm.  2. Thromboembolic risk factors notable for,      a.     Age.      b.     Hypertension.      c.     Cardiomyopathy.  3. Hypertension - poorly controlled.  4. Bradycardia.   Travis [name]'s bradycardia had some impact on choice event of  antihypertensives.  With his blood pressure running high again, I have  suggested that he get a blood pressure machine and make a series of  measurements.  In the event that his systolic blood pressures are  running over about 135, I have suggested that he start antihypertensive  therapy with amlodipine and I  have sent a prescription for this to [facility].  He is to follow up with you in a couple of months as scheduled.  In the  event that he develops edema on the Norvasc, he is to discontinue it and  call us.   We will plan to see him otherwise and 1 year's time.    Sincerely,      Duke Salvia, MD, [facility]  Electronically Signed    SCK/MedQ  DD: 05/31/2008  DT: 06/01/2008  Job #: [id number]

## 2010-10-08 NOTE — Assessment & Plan Note (Signed)
[facility] HEALTHCARE                            CARDIOLOGY OFFICE NOTE   TANOR, GLASPY                        MRN:          [id number]  DATE:09/01/2007                            DOB:          July 06, 1940    [name] is a 70 year old patient of Dr. Lovell Sheehan.  He has had recent  discovery of a fib he was set up for a stress test.  He was to see Dr.  Graciela Husbands in a month.  However, his stress test showed decreased LV function  and the  nuclear techs felt he should be seen today.  I saw him as an  add on as doc of the day. The patient has been having some exertional  dyspnea.   He normally works out at [employer] and is quite active.  He has been  having increasing dyspnea and there has been no PND, orthopnea.  There  has been no palpitations or chest pain.   His risk factors include age, hypertension.  He does not have diabetes.  He is a nonsmoker.   FAMILY HISTORY:  Is negative.   I have showed the patient's his stress test and explained to him at  length his multiple diagnoses. He certainly does have atrial  fibrillation.   We discussed at length the issues regarding rate control,  antiarrhythmics and anticoagulation.  He has only been therapeutic on  his Coumadin for 1 week.   I explained to the patient that he would need to be anticoagulated for 3-  4 weeks and then probably would benefit from cardioversion. His Myoview  study today showed left jugular cavity enlargement.  There was some  thinning of the inferior wall, but no marked ischemia or previous  infarction.  His EF was estimated at 21%.  I explained to the patient  that this may not be totally accurate due to his A fib.  However, there  was clearly left ventricular cavity enlargement.  He had a 2-D  echocardiogram done in March 2004, which showed normal left ventricular  size with mild LVH and an EF that was normal and I also explained to him  that I could not tell whether his presumed new  cardiomyopathy was  resulting in his A fib or whether the A fib resulted in his decreased LV  function.   The patient will continue his Coumadin and we will try to get him out of  this rhythm and then pursue the diagnosis of coronary artery disease  more.   However, clinically, there is a low risk of coronary disease and his  Myoview is more consistent with nonischemic cardiomyopathy.  His review  of systems otherwise negative.   PAST MEDICAL HISTORY:  1. Hypertension.  2. Osteoarthritis.   PAST SURGICAL HISTORY:  1. Previous EGD.  2. Aneurysm with clips in his head.  3. Rotator cuff surgery on the left.  4. Previous fracture of the left arm as a child.  5. Tonsillectomy.  6. Inguinal hernia.  7. Left tendon repair.   FAMILY HISTORY:  Is remarkable for no premature coronary  disease.   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  He is extremely active working out all the time. He does not  smoke or drink.   He is a previous smoker.  He is single.   CURRENT MEDICATIONS:  1. Meloxicam 15 daily.  2. Glucosamine.  3. B complex.  4. Propecia.  5. Milk thistle.  6. Flaxseed.  7. Micardis 80 a day.  8. Aspirin a day.  9. Niacin.  10.Prilosec 20 a day.  11.Metoprolol 25 b.i.d.  12.Coumadin as directed.   PHYSICAL EXAMINATION:  Is remarkable for healthy-appearing 70 year old white male in no distress.  He is an A fib at a rate of 70-80, blood  pressure is 130/70, afebrile, respiratory rate is 14.  HEENT:  Unremarkable.  Carotids are normal without bruit, no  lymphadenopathy, thyromegaly JVP elevation.  LUNGS:  Clear with good  diaphragmatic motion.  No wheezing.  S1-S2 with soft systolic murmur.  PMI is increased.  ABDOMEN:  Benign.  Bowel sounds positive.  No AAA, no tenderness, no  hepatosplenomegaly.  No hepatojugular reflux.  Distal pulses are intact. No edema.  NEURO:  Nonfocal.  SKIN:  Warm and dry.  No muscular weakness.  He  has a scar over the left forearm from previous  break.   EKG shows A fib with incomplete left bundle branch block. As indicated,  I reviewed his Myoview at length as well as his stress test and notes  from Dr. Lovell Sheehan.   IMPRESSION:  1. Atrial fibrillation, reasonable rate control on beta blocker.      Continue Coumadin, probable  elective cardioversion in 3-4 weeks.  2. Abnormal Myoview, low risk for coronary disease. More likely      nonischemic cardiomyopathy would like to take care of his rhythm      first and then possibly followup his LV function.  If there is no      improvement, he may need to have a cardiac CT or heart      catheterization.  He is not a great candidate for an MRI to assess      LV function or scar tissue due to previous aneurysm clip.  3. Hypertension, currently well controlled.  Continue ACE inhibitor      which will help with afterload.  Continue ARB Micardis which will      help with afterload.  4. Anticoagulation. He will follow up with Dr. Lovell Sheehan, not in our      Coumadin Clinic.  We need to document  3 weeks in a row of      therapeutic INRs of 2.5 or above.   I will see the patient back and he will have a 2-D echocardiogram to  confirm a decreased EF and then we will plan on elective cardioversion  hopefully in 4 weeks.     Noralyn Pick. Eden Emms, MD, [facility]  Electronically Signed    PCN/MedQ  DD: 09/01/2007  DT: 09/01/2007  Job #: [id number]

## 2010-10-08 NOTE — Assessment & Plan Note (Signed)
[facility]   NAME:  Travis [name], Travis [name]                 ACCOUNT NO.:  [id number]   MEDICAL RECORD NO.:  [id number]      DATE OF BIRTH:  1941-01-19   PHYSICIAN:  Noralyn Pick. Eden Emms, MD, [facility] VISIT DATE:  10/08/2007                                   OFFICE VISIT   Transesophageal echocardiogram with cardioversion   INDICATIONS:  Rapid atrial fibrillation with congestive heart failure.   The patient was on therapeutic heparin at the time of procedure.  INR  was 1.8, but had been therapeutic in the past.   The patient was sedated with 10 mg of Versed and 150 mcg of fentanyl.   Using digital technique, an OmniPlane probe was advanced in distal  esophagus without incident.   Transgastric imaging revealed global hypokinesis with an EF of 25% and  moderate left jugular cavity enlargement.  There was mild MR.  There was  moderate biatrial enlargement.  There was no ASD.  Aortic valve with  trileaflet normal.  Right ventricle was mildly dilated and mitral valve  with mild insufficiency.  The left atrial appendage was moderately  dilated with mild spontaneous contrast.  In both atria and the  appendage, there was no formed thrombus.  Imaging of the aorta showed no  significant debris.   The patient was cardioverted synchronously with a single biphasic 200  joules shock.  He converted to sinus rhythm at a rate of 60.   IMPRESSION:  Successful transesophageal echocardiography guided  cardioversion.      Noralyn Pick. Eden Emms, MD, [facility]  Electronically Signed     PCN/MEDQ  D:  10/08/2007  T:  10/09/2007  Job:  [id number]

## 2010-10-08 NOTE — Patient Instructions (Signed)
Same dose 

## 2010-10-11 NOTE — Op Note (Signed)
[facility]  Patient:    Travis [name], Travis [name]                          MRN: [id number] Proc. Date: 10/17/99 Attending:  Quita Skye. Hart Rochester, M.D.                           Operative Report  PREOPERATIVE DIAGNOSIS:  Severe recurrent varicosities of right leg, greater and lesser saphenous systems.  POSTOPERATIVE DIAGNOSES:  Severe recurrent varicosities of right leg, greater and lesser saphenous systems.  OPERATION:  Removal of lesser saphenous vein, right leg, with excision of multiple varicosities of greater and lesser saphenous systems.  SURGEON:  Quita Skye. Hart Rochester, M.D.  FIRST ASSISTANT:  Lissa Merlin, P.A.  ANESTHESIA:  General endotracheal.  DESCRIPTION OF PROCEDURE:  Patient was taken to the operating room and placed in the supine position, after the veins in the right leg had been marked with the patient in the upright position.  These were almost circumferential in the calf, from the ankle to the knee, involving the lesser and greater saphenous systems, greater saphenous system having previously been stripped.  There were no varicosities noted above the knee except in the posterior popliteal fossa. After prepping and draping in routine sterile manner, the greater saphenous vein was identified at the ankle level and it had been ligated just proximal to this point and the residual portion was removed and ligated distally. Short transverse incisions were then made over all the marked varicosities anteriorly in the pretibial region and over the greater saphenous system and the veins were removed by the dissection-and-avulsion technique.  Following this, the lesser saphenous vein was traced from the distal calf to the popliteal fossa through multiple transverse incisions and was ligated at the popliteal area.  Its branches were avulsed with a hemostat and multiple large ropey varicosities were removed.  This required approximately 15 or 20 incisions.  When this  had been completed, the patient was placed in the Trendelenburg position and wrapped with an Ace for hemostasis.  Wound was closed in a subcuticular fashion with 3-0 Vicryl and Steri-Strips, sterile dressing applied consisting of 4 x 4s, Webril and Ace wrap and patient taken to the recovery room in satisfactory condition. DD:  10/17/99 TD:  10/22/99 Job: [id number]

## 2010-10-11 NOTE — Assessment & Plan Note (Signed)
[facility]                           GASTROENTEROLOGY OFFICE NOTE   PHYLLIP, CLAW                        MRN:          [id number]  DATE:12/22/2005                            DOB:          1940/12/20    REFERRING PHYSICIAN:  Stacie Glaze, MD   REASON FOR REFERRAL:  Dr. Lovell Sheehan asked me to evaluate Travis [name] regarding  recent swallowing difficulties, choking spells.   HISTORY OF PRESENT ILLNESS:  Travis [name] is a very pleasant and already  healthy 70 year old man who has had three episodes of trouble eating.  He  describes three choking spells where he actually had to have a Heimlich  maneuver performed.  He was able to breathe through his nose but felt like  he was indeed choking during these dramatic episodes.  Two of these occurred  around other people.  He was able to dislodge a chunk of meat each time.  The third time was at home, and he did a self Heimlich maneuver over a piece  of furniture, also I believe pancakes was the culprit at that point.  He  tells me he is a pretty fast eater usually, although since these episodes  are happened, he has very consciously tried to slow down his eating.  He has  well-fitting permanent false teeth.   He describes no true dysphagia or odynophagia, no GERD symptoms.   REVIEW OF SYSTEMS:  Notable for stable weight over the past several years.   PAST MEDICAL HISTORY:  Status post ruptured brain aneurysm in 1980 that was  treated with surgery and clipping, (he can not have MRI, therefore),  hypertension, arthritis.   CURRENT MEDICATIONS:  1.  Meloxicam.  2.  Glucosamine.  3.  B complex.  4.  Propecia.  5.  Milk thistle.  6.  Flaxseed oil.  7.  Micardis.  8.  Aspirin.  9.  Niacin.  10. Blood pressure pill which he is not aware of the name of this pill.   ALLERGIES:  No known drug allergies.   SOCIAL HISTORY:  Single, no children, lives alone, retired, nonsmoker,  drinks about two glasses  of wine a day.   FAMILY HISTORY:  No colon cancer or colon polyps in family.   PHYSICAL EXAMINATION:  VITAL SIGNS:  5 feet, 11 inches, 188 pounds, blood  pressure 142/82, pulse 52.  CONSTITUTIONAL:  Generally well-appearing, well-tanned, physically fit man.  NEUROLOGIC:  Alert and oriented x3.  EYES:  Extraocular movements intact.  MOUTH:  Oropharynx moist, no lesions.  NECK:  Supple, no lymphadenopathy.  CARDIOVASCULAR:  Heart regular rate and rhythm.  LUNGS:  Clear to auscultation bilaterally.  ABDOMEN: Soft, nontender, nondistended.  Normal bowel sounds.  EXTREMITIES:  No lower extremity edema.  SKIN:  No rash or lesions are notable on extremities.   ASSESSMENT/PLAN:  A 70 year old man with three episodes of difficulty  swallowing.   He did not describe any true dysphagia symptoms or gastroesophageal reflux  disease symptoms.  These episodes really do seem more to be oropharyngeal in  nature, and that being said, I think  we should proceed with an EGD as soon  as convenient to just make sure there is no esophageal disease, if that is  indeed the case.  I will arrange for him to see a speech pathologist for  formal swallowing evaluation.  In the meantime, he knows to really chew his  food very well and slow down during his meals.                                   Rachael Fee, MD   DPJ/MedQ  DD:  12/22/2005  DT:  12/22/2005  Job #:  [id number]   cc:   Stacie Glaze, MD

## 2010-10-27 ENCOUNTER — Other Ambulatory Visit: Payer: Self-pay | Admitting: Internal Medicine

## 2010-11-07 ENCOUNTER — Ambulatory Visit: Payer: Medicare Other

## 2010-11-07 DIAGNOSIS — I4891 Unspecified atrial fibrillation: Secondary | ICD-10-CM

## 2010-11-07 LAB — POCT INR: INR: 2.5

## 2010-11-07 NOTE — Patient Instructions (Signed)
Same dose 

## 2010-11-10 ENCOUNTER — Other Ambulatory Visit: Payer: Self-pay | Admitting: Gastroenterology

## 2010-11-12 ENCOUNTER — Encounter: Payer: Self-pay | Admitting: Gastroenterology

## 2010-11-12 ENCOUNTER — Other Ambulatory Visit: Payer: Self-pay | Admitting: Gastroenterology

## 2010-11-12 NOTE — Telephone Encounter (Signed)
ERROR

## 2010-11-12 NOTE — Telephone Encounter (Signed)
Left message on patient's voicemail that he needs an office visit. He has not been seen since 2007. Advised that he may get omeprazole over the counter until his office visit.

## 2010-12-04 ENCOUNTER — Other Ambulatory Visit: Payer: Self-pay | Admitting: *Deleted

## 2010-12-04 DIAGNOSIS — I4891 Unspecified atrial fibrillation: Secondary | ICD-10-CM

## 2010-12-04 MED ORDER — PROPAFENONE HCL ER 225 MG PO CP12: ORAL_CAPSULE | ORAL | Status: DC

## 2010-12-05 ENCOUNTER — Ambulatory Visit: Payer: BLUE CROSS/BLUE SHIELD

## 2010-12-06 ENCOUNTER — Ambulatory Visit: Payer: Medicare Other | Admitting: Internal Medicine

## 2010-12-06 DIAGNOSIS — I4891 Unspecified atrial fibrillation: Secondary | ICD-10-CM

## 2010-12-06 LAB — POCT INR: INR: 2.6

## 2010-12-06 NOTE — Patient Instructions (Signed)
Same dose 4 weeks

## 2010-12-10 ENCOUNTER — Encounter: Payer: Self-pay | Admitting: Internal Medicine

## 2010-12-10 ENCOUNTER — Ambulatory Visit (INDEPENDENT_AMBULATORY_CARE_PROVIDER_SITE_OTHER): Payer: Medicare Other | Admitting: Internal Medicine

## 2010-12-10 VITALS — BP 142/88 | HR 59 | Ht 68.0 in | Wt 189.0 lb

## 2010-12-10 DIAGNOSIS — I4891 Unspecified atrial fibrillation: Secondary | ICD-10-CM

## 2010-12-10 DIAGNOSIS — I10 Essential (primary) hypertension: Secondary | ICD-10-CM

## 2010-12-10 DIAGNOSIS — I447 Left bundle-branch block, unspecified: Secondary | ICD-10-CM | POA: Insufficient documentation

## 2010-12-10 NOTE — Assessment & Plan Note (Signed)
There has been interval widening of the QRS from 130-156 ms. We'll need to keep our eye on this. It is not a typical left bundle branch block as there is a small Q wave in leads one and L. Not unlike electrocardiogram from a year ago

## 2010-12-10 NOTE — Assessment & Plan Note (Signed)
No intercurrent atrial fibrillation. We'll continue him on Rythmol

## 2010-12-10 NOTE — Progress Notes (Signed)
HPI  Travis [name] is a 70 y.o. male w history of paroxysmal atrial fibrillation and tachycardia-induced cardiomyopathySeen in followup today. He has been maintained on Rythmol  The patient denies chest pain, shortness of breath, nocturnal dyspnea, orthopnea or peripheral edema.  There have been no palpitations, lightheadedness or syncope.    Past Medical History  Diagnosis Date  . Arthritis   . Hypertension   . GERD (gastroesophageal reflux disease)   . Cardiomyopathy secondary     Tachycardia-induced cardiomyopathy. Echo April 2009 EF 20-25%. June 2009--55%  . Status post clamping of cerebral aneurysm   . Sinus bradycardia   . Paroxysmal atrial fibrillation     On Rythmol    Past Surgical History  Procedure Date  . Cerebral anuersym post clips   . Rotator cuff repair   . Fractured left arm   . Tonsillectomy   . Hernia repair   . Left tendon repair     Current Outpatient Prescriptions  Medication Sig Dispense Refill  . ALPRAZolam (XANAX) 0.5 MG tablet Take 0.5 mg by mouth every 6 (six) hours as needed.        Marland Kitchen amLODipine (NORVASC) 5 MG tablet Take 1 tablet (5 mg total) by mouth daily.  30 tablet  3  . b complex vitamins tablet Take 1 tablet by mouth daily.        Marland Kitchen doxepin (SINEQUAN) 10 MG capsule TAKE ONE CAPSULE BY MOUTH AT BEDTIME  30 capsule  5  . finasteride (PROPECIA) 1 MG tablet Take 1 mg by mouth daily.        . fish oil-omega-3 fatty acids 1000 MG capsule Take 1 g by mouth daily.        . Flaxseed, Linseed, (RA FLAX SEED OIL 1000 PO) Take by mouth 3 (three) times daily.        . furosemide (LASIX) 20 MG tablet Take 20 mg by mouth daily.        . Glucosamine Sulfate (GLUCOSAMINE RELIEF) 1000 MG TABS Take 1 tablet by mouth 2 (two) times daily.        . meloxicam (MOBIC) 15 MG tablet TAKE 1 TABLET EVERY DAY  30 tablet  5  . MICARDIS 80 MG tablet TAKE 1 TABLET EVERY DAY  30 tablet  9  . Milk Thistle 500 MG CAPS Take by mouth.        . niacinamide 500 MG tablet  Take 500 mg by mouth. 2 tabs once daily        . omeprazole (PRILOSEC) 20 MG capsule Take 20 mg by mouth daily.        . propafenone (RYTHMOL SR) 225 MG 12 hr capsule 1 and 1/2 tablets twice daily  90 capsule  2  . telmisartan (MICARDIS) 40 MG tablet Take 40 mg by mouth daily.        Marland Kitchen warfarin (COUMADIN) 5 MG tablet Take 5 mg by mouth.          No Known Allergies  Review of Systems negative except from HPI and PMH  Physical Exam Well developed and well nourished in no acute distress HENT normal E scleral and icterus clear Neck Supple JVP flat; carotids brisk and full Clear to ausculation Regular rate and rhythm,S4 and early systolic murmur at left lower sternal border Soft with active bowel sounds No clubbing cyanosis and edema Alert and oriented, grossly normal motor and sensory function Skin Warm and Dry  ECG Sinus rhythm at 59 Intervals 0.20/0.16/0.43 Axis is  leftward at -40  Assessment and  Plan

## 2010-12-10 NOTE — Assessment & Plan Note (Signed)
Blood pressure is reasonably well controlled. ?

## 2010-12-10 NOTE — Patient Instructions (Signed)
Your physician wants you to follow-up in: 1 year  You will receive a reminder letter in the mail two months in advance. If you don't receive a letter, please call our office to schedule the follow-up appointment.  Your physician recommends that you continue on your current medications as directed. Please refer to the Current Medication list given to you today.  

## 2010-12-15 ENCOUNTER — Other Ambulatory Visit: Payer: Self-pay | Admitting: Internal Medicine

## 2010-12-31 ENCOUNTER — Encounter: Payer: Self-pay | Admitting: Gastroenterology

## 2010-12-31 ENCOUNTER — Ambulatory Visit (INDEPENDENT_AMBULATORY_CARE_PROVIDER_SITE_OTHER): Payer: Medicare Other | Admitting: Gastroenterology

## 2010-12-31 VITALS — BP 134/76 | HR 60 | Ht 68.0 in | Wt 190.0 lb

## 2010-12-31 DIAGNOSIS — K279 Peptic ulcer, site unspecified, unspecified as acute or chronic, without hemorrhage or perforation: Secondary | ICD-10-CM

## 2010-12-31 MED ORDER — OMEPRAZOLE 20 MG PO CPDR: 20.0000 mg | DELAYED_RELEASE_CAPSULE | Freq: Every day | ORAL | Status: DC

## 2010-12-31 NOTE — Progress Notes (Signed)
Review of pertinent gastrointestinal problems: 1. Routine risk for colon cancer. Last colonoscopy October 2007 found diverticulosis, no polyps or cancers. Next colonoscopy October 2017.  2. personal history of gastric ulcer, likely NSAID related. Fall 2007 had 2 endoscopies, the second prove that the previous gastric ulcer had healed.  3. GERD, well treated with once daily proton pump inhibitor   HPI: This is a  very pleasant 70 year old man whom I last saw about 3 years ago.  He has been taking proton pump inhibitor once daily for history of peptic ulcer disease, NSAID related. He has ongoing need for meloxicam  for his significant arthritis pains and I have previously advised him to take proton pump inhibitor once daily as long as he continues to require NSAIDs, meloxicam.  Has been 'perfect' since last visit except for cardiac arythmia. Was put on meds only and has been.  No pyrosis.  No dysphagia.  Weight stable.  No overt gi bleeding.  No bowel changes.    Review of systems: Pertinent positive and negative review of systems were noted in the above HPI section.  All other review of systems was otherwise negative.   Past Medical History  Diagnosis Date  . Arthritis   . Hypertension   . GERD (gastroesophageal reflux disease)   . Cardiomyopathy secondary     Tachycardia-induced cardiomyopathy. Echo April 2009 EF 20-25%. June 2009--55%  . Status post clamping of cerebral aneurysm   . Sinus bradycardia   . Paroxysmal atrial fibrillation     On Rythmol  . Arrhythmia     Past Surgical History  Procedure Date  . Cerebral anuersym post clips   . Rotator cuff repair   . Fractured left arm   . Tonsillectomy   . Hernia repair   . Left tendon repair      reports that he quit smoking about 27 years ago. He has never used smokeless tobacco. He reports that he drinks alcohol. He reports that he does not use illicit drugs.  family history includes Lung cancer (age of onset:80) in his  mother and Stroke in his father.  There is no history of Colon cancer.    Current Medications, Allergies were all reviewed with the patient via [facility] electronic medical record system.    Physical Exam: BP 134/76  Pulse 60  Ht 5\' 8"  (1.727 m)  Wt 190 lb (86.183 kg)  BMI 28.89 kg/m2 Constitutional: generally well-appearing Psychiatric: alert and oriented x3 Eyes: extraocular movements intact Mouth: oral pharynx moist, no lesions Neck: supple no lymphadenopathy Cardiovascular: heart regular rate and rhythm Lungs: clear to auscultation bilaterally Abdomen: soft, nontender, nondistended, no obvious ascites, no peritoneal signs, normal bowel sounds Extremities: no lower extremity edema bilaterally Skin: no lesions on visible extremities    Assessment and plan: 70 y.o. male with personal history of NSAID related peptic ulcer disease He has no alarm symptoms. I refilled his omeprazole 20 mg once daily which he notices take for as long as he is requiring arthritis pain medicines. I suspect this will be indefinitely. I went over her list of alarm symptoms and he will get touch if he has any of these or any further GI concerns.

## 2010-12-31 NOTE — Patient Instructions (Signed)
New prescription for omeprazole was called in. Take one pill daily for as long as you are on NSAIDs (Mobic, alleve, ibuprofen, goody's etc). A copy of this information will be made available to Dr. Lovell Sheehan. Call with any problems, new gi symptoms.

## 2011-01-06 ENCOUNTER — Ambulatory Visit (INDEPENDENT_AMBULATORY_CARE_PROVIDER_SITE_OTHER): Payer: Medicare Other | Admitting: Internal Medicine

## 2011-01-06 DIAGNOSIS — I4891 Unspecified atrial fibrillation: Secondary | ICD-10-CM

## 2011-01-06 NOTE — Patient Instructions (Signed)
Same dose 

## 2011-01-09 ENCOUNTER — Other Ambulatory Visit: Payer: Self-pay | Admitting: Internal Medicine

## 2011-01-15 ENCOUNTER — Encounter: Payer: BLUE CROSS/BLUE SHIELD | Admitting: Internal Medicine

## 2011-02-02 ENCOUNTER — Other Ambulatory Visit: Payer: Self-pay | Admitting: Internal Medicine

## 2011-02-03 ENCOUNTER — Ambulatory Visit (INDEPENDENT_AMBULATORY_CARE_PROVIDER_SITE_OTHER): Payer: Medicare Other | Admitting: Internal Medicine

## 2011-02-03 ENCOUNTER — Encounter: Payer: Self-pay | Admitting: Internal Medicine

## 2011-02-03 DIAGNOSIS — E785 Hyperlipidemia, unspecified: Secondary | ICD-10-CM

## 2011-02-03 DIAGNOSIS — I10 Essential (primary) hypertension: Secondary | ICD-10-CM

## 2011-02-03 DIAGNOSIS — T887XXA Unspecified adverse effect of drug or medicament, initial encounter: Secondary | ICD-10-CM

## 2011-02-03 DIAGNOSIS — E871 Hypo-osmolality and hyponatremia: Secondary | ICD-10-CM

## 2011-02-03 DIAGNOSIS — Z0000 Encounter for general adult medical examination without abnormal findings: Secondary | ICD-10-CM

## 2011-02-03 DIAGNOSIS — I4891 Unspecified atrial fibrillation: Secondary | ICD-10-CM

## 2011-02-03 LAB — POCT URINALYSIS DIPSTICK
Bilirubin, UA: NEGATIVE
Blood, UA: NEGATIVE
Ketones, UA: NEGATIVE
Protein, UA: NEGATIVE
pH, UA: 8.5

## 2011-02-03 LAB — CBC WITH DIFFERENTIAL/PLATELET
Eosinophils Relative: 2.4 % (ref 0.0–5.0)
HCT: 45.3 % (ref 39.0–52.0)
Hemoglobin: 15.2 g/dL (ref 13.0–17.0)
Lymphs Abs: 1.4 10*3/uL (ref 0.7–4.0)
MCV: 102.8 fl — ABNORMAL HIGH (ref 78.0–100.0)
Monocytes Absolute: 0.4 10*3/uL (ref 0.1–1.0)
Monocytes Relative: 10.6 % (ref 3.0–12.0)
Neutro Abs: 2.2 10*3/uL (ref 1.4–7.7)
RDW: 14.2 % (ref 11.5–14.6)
WBC: 4.1 10*3/uL — ABNORMAL LOW (ref 4.5–10.5)

## 2011-02-03 LAB — POCT INR: INR: 2.3

## 2011-02-03 NOTE — Patient Instructions (Signed)
Same dose of coumadin 

## 2011-02-03 NOTE — Progress Notes (Signed)
Subjective:    Travis [name] is a 70 y.o. male who presents for Medicare Annual/Subsequent preventive examination.   Preventive Screening-Counseling & Management  Tobacco History  Smoking status  . Former Smoker  . Quit date: 05/27/1983  Smokeless tobacco  . Never Used    Problems Prior to Visit 1.   Current Problems (verified) Patient Active Problem List  Diagnoses  . HYPONATREMIA  . HYPERKALEMIA  . TRANSIENT DISORDER INITIATING/MAINTAINING SLEEP  . HYPERTENSION  . ATRIAL FIBRILLATION  . DRY MOUTH  . GERD  . ING HERN W/O MENTION OBST/GANGREN UNILAT/UNSPEC  . OSTEOARTHRITIS  . GANGLION CYST, WRIST, LEFT  . CAROTID BRUIT  . UNS ADVRS EFF UNS RX MEDICINAL&BIOLOGICAL SBSTNC  . Left bundle branch block    Medications Prior to Visit Current Outpatient Prescriptions on File Prior to Visit  Medication Sig Dispense Refill  . ALPRAZolam (XANAX) 0.5 MG tablet Take 0.5 mg by mouth every 6 (six) hours as needed.        Marland Kitchen amLODipine (NORVASC) 5 MG tablet Take 1 tablet (5 mg total) by mouth daily.  30 tablet  3  . b complex vitamins tablet Take 1 tablet by mouth daily.        Marland Kitchen doxepin (SINEQUAN) 10 MG capsule TAKE ONE CAPSULE BY MOUTH AT BEDTIME  30 capsule  5  . finasteride (PROSCAR) 5 MG tablet TAKE 1 TABLET EVERY DAY  30 tablet  2  . fish oil-omega-3 fatty acids 1000 MG capsule Take 1 g by mouth daily.        . Flaxseed, Linseed, (RA FLAX SEED OIL 1000 PO) Take by mouth 3 (three) times daily.        . furosemide (LASIX) 20 MG tablet TAKE 1 TABLET EVERY DAY  30 tablet  9  . Glucosamine Sulfate (GLUCOSAMINE RELIEF) 1000 MG TABS Take 1 tablet by mouth 2 (two) times daily.        . meloxicam (MOBIC) 15 MG tablet TAKE 1 TABLET EVERY DAY  30 tablet  5  . MICARDIS 80 MG tablet TAKE 1 TABLET EVERY DAY  30 tablet  9  . Milk Thistle 500 MG CAPS Take by mouth 2 (two) times daily.       . niacinamide 500 MG tablet Take 500 mg by mouth. 2 tabs once daily        . omeprazole (PRILOSEC)  20 MG capsule Take 1 capsule (20 mg total) by mouth daily.  30 capsule  12  . propafenone (RYTHMOL SR) 225 MG 12 hr capsule 1 and 1/2 tablets twice daily  90 capsule  2  . warfarin (COUMADIN) 5 MG tablet Take 5 mg by mouth.          Current Medications (verified) Current Outpatient Prescriptions  Medication Sig Dispense Refill  . ALPRAZolam (XANAX) 0.5 MG tablet Take 0.5 mg by mouth every 6 (six) hours as needed.        Marland Kitchen amLODipine (NORVASC) 5 MG tablet Take 1 tablet (5 mg total) by mouth daily.  30 tablet  3  . b complex vitamins tablet Take 1 tablet by mouth daily.        Marland Kitchen doxepin (SINEQUAN) 10 MG capsule TAKE ONE CAPSULE BY MOUTH AT BEDTIME  30 capsule  5  . finasteride (PROSCAR) 5 MG tablet TAKE 1 TABLET EVERY DAY  30 tablet  2  . fish oil-omega-3 fatty acids 1000 MG capsule Take 1 g by mouth daily.        Marland Kitchen  Flaxseed, Linseed, (RA FLAX SEED OIL 1000 PO) Take by mouth 3 (three) times daily.        . furosemide (LASIX) 20 MG tablet TAKE 1 TABLET EVERY DAY  30 tablet  9  . Glucosamine Sulfate (GLUCOSAMINE RELIEF) 1000 MG TABS Take 1 tablet by mouth 2 (two) times daily.        . meloxicam (MOBIC) 15 MG tablet TAKE 1 TABLET EVERY DAY  30 tablet  5  . MICARDIS 80 MG tablet TAKE 1 TABLET EVERY DAY  30 tablet  9  . Milk Thistle 500 MG CAPS Take by mouth 2 (two) times daily.       . niacinamide 500 MG tablet Take 500 mg by mouth. 2 tabs once daily        . omeprazole (PRILOSEC) 20 MG capsule Take 1 capsule (20 mg total) by mouth daily.  30 capsule  12  . propafenone (RYTHMOL SR) 225 MG 12 hr capsule 1 and 1/2 tablets twice daily  90 capsule  2  . warfarin (COUMADIN) 5 MG tablet Take 5 mg by mouth.           Allergies (verified) Review of patient's allergies indicates no known allergies.   PAST HISTORY  Family History Family History  Problem Relation Age of Onset  . Stroke Father   . Lung cancer Mother 12  . Colon cancer Neg Hx     Social History History  Substance Use Topics    . Smoking status: Former Smoker    Quit date: 05/27/1983  . Smokeless tobacco: Never Used  . Alcohol Use: Yes     2 daily     Are there smokers in your home (other than you)?  No  Risk Factors Current exercise habits: Gym/ health club routine includes cardio, high impact aerobics , low impact aerobics, mod to heavy weightlifting and treadmill.  Dietary issues discussed: none   Cardiac risk factors: advanced age (older than 3 for men, 63 for women) and male gender.  Depression Screen (Note: if answer to either of the following is "Yes", a more complete depression screening is indicated)   Q1: Over the past two weeks, have you felt down, depressed or hopeless? No  Q2: Over the past two weeks, have you felt little interest or pleasure in doing things? No  Have you lost interest or pleasure in daily life? No  Do you often feel hopeless? No  Do you cry easily over simple problems? No  Activities of Daily Living In your present state of health, do you have any difficulty performing the following activities?:  Driving? No Managing money?  no Feeding yourself? No Getting from bed to chair? No Climbing a flight of stairs? No Preparing food and eating?: No Bathing or showering? No Getting dressed: No Getting to the toilet? No Using the toilet:No Moving around from place to place: No In the past year have you fallen or had a near fall?:No   Are you sexually active?  Yes  Do you have more than one partner?  No  Hearing Difficulties: No Do you often ask people to speak up or repeat themselves? No Do you experience ringing or noises in your ears? No Do you have difficulty understanding soft or whispered voices? No   Do you feel that you have a problem with memory? No  Do you often misplace items? No  Do you feel safe at home?  No  Cognitive Testing  Alert? Yes  Normal Appearance?Yes  Oriented to person? Yes  Place? Yes   Time? Yes  Recall of three objects?  Yes  Can  perform simple calculations? Yes  Displays appropriate judgment?Yes  Can read the correct time from a watch face?Yes   Advanced Directives have been discussed with the patient? Yes   List the Names of Other Physician/Practitioners you currently use: 1.    Indicate any recent Medical Services you may have received from other than [facility] providers in the past year (date may be approximate).  Immunization History  Administered Date(s) Administered  . Influenza Whole 04/17/2004, 03/31/2007, 02/17/2008, 04/12/2009, 03/13/2010  . Pneumococcal Polysaccharide 04/12/2009  . Td 04/17/2004  . Zoster 09/25/2010    Screening Tests Health Maintenance  Topic Date Due  . Influenza Vaccine  02/24/2011  . Tetanus/tdap  04/17/2014  . Colonoscopy  02/24/2016  . Pneumococcal Polysaccharide Vaccine Age 14 And Over  Completed  . Zostavax  Completed    All answers were reviewed with the patient and necessary referrals were made:  LATONYA, KNIGHT   02/03/2011   History reviewed: allergies, current medications, past family history, past medical history, past social history, past surgical history and problem list  Review of Systems A comprehensive review of systems was negative.    Objective:     Vision by Snellen chart: right eye:20/20, left eye:20/20 Blood pressure 136/84, pulse 71, temperature 97.8 F (36.6 C), temperature source Oral, height 5\' 8"  (1.727 m), weight 181 lb (82.101 kg), SpO2 98.00%. Body mass index is 27.52 kg/(m^2).  BP 136/84  Pulse 71  Temp(Src) 97.8 F (36.6 C) (Oral)  Ht 5\' 8"  (1.727 m)  Wt 181 lb (82.101 kg)  BMI 27.52 kg/m2  SpO2 98%  General Appearance:    Alert, cooperative, no distress, appears stated age  Head:    Normocephalic, without obvious abnormality, atraumatic  Eyes:    PERRL, conjunctiva/corneas clear, EOM's intact, fundi    benign, both eyes       Ears:    Normal TM's and external ear canals, both ears  Nose:   Nares normal, septum midline,  mucosa normal, no drainage    or sinus tenderness  Throat:   Lips, mucosa, and tongue normal; teeth and gums normal  Neck:   Supple, symmetrical, trachea midline, no adenopathy;       thyroid:  No enlargement/tenderness/nodules; no carotid   bruit or JVD  Back:     Symmetric, no curvature, ROM normal, no CVA tenderness  Lungs:     Clear to auscultation bilaterally, respirations unlabored  Chest wall:    No tenderness or deformity  Heart:    Regular rate and rhythm, S1 and S2 normal, no murmur, rub   or gallop  Abdomen:     Soft, non-tender, bowel sounds active all four quadrants,    no masses, no organomegaly  Genitalia:    Normal male without lesion, discharge or tenderness  Rectal:    Normal tone, normal prostate, no masses or tenderness;   guaiac negative stool  Extremities:   Extremities normal, atraumatic, no cyanosis or edema  Pulses:   2+ and symmetric all extremities  Skin:   Skin color, texture, turgor normal, no rashes or lesions  Lymph nodes:   Cervical, supraclavicular, and axillary nodes normal  Neurologic:   CNII-XII intact. Normal strength, sensation and reflexes      throughout       Assessment:        Patient presents for yearly preventative medicine examination.  all immunizations and health maintenance protocols were reviewed with the patient and they are up to date with these protocols.   screening laboratory values were reviewed with the patient including screening of hyperlipidemia PSA renal function and hepatic function.   There medications past medical history social history problem list and allergies were reviewed in detail.   Goals were established with regard to weight loss exercise diet in compliance with medications    Plan:     stable control blood pressure, atrial fibrillation stable and monitoring of protime was done today monitoring of PSA renal lipid liver all ordered today and will be monitored for his chronic medical problems During the  course of the visit the patient was educated and counseled about appropriate screening and preventive services including:    Influenza vaccine  Prostate cancer screening  Advanced directives: has an advanced directive - a copy has been provided  Diet review for nutrition referral? Yes ____  Not Indicated ____x   Patient Instructions (the written plan) was given to the patient.  Medicare Attestation I have personally reviewed: The patient's medical and social history Their use of alcohol, tobacco or illicit drugs Their current medications and supplements The patient's functional ability including ADLs,fall risks, home safety risks, cognitive, and hearing and visual impairment Diet and physical activities Evidence for depression or mood disorders  The patient's weight, height, BMI, and visual acuity have been recorded in the chart.  I have made referrals, counseling, and provided education to the patient based on review of the above and I have provided the patient with a written personalized care plan for preventive services.     Darryll Capers EDWARD   02/03/2011

## 2011-02-04 LAB — TSH: TSH: 1.19 u[IU]/mL (ref 0.35–5.50)

## 2011-02-05 LAB — LIPID PANEL
Cholesterol: 225 mg/dL — ABNORMAL HIGH (ref 0–200)
HDL: 90.5 mg/dL (ref >39.00)
VLDL: 4.2 mg/dL (ref 0.0–40.0)

## 2011-02-05 LAB — HEPATIC FUNCTION PANEL
Albumin: 4.4 g/dL (ref 3.5–5.2)
Alkaline Phosphatase: 80 U/L (ref 39–117)
Total Protein: 6.7 g/dL (ref 6.0–8.3)

## 2011-02-05 LAB — BASIC METABOLIC PANEL
CO2: 27 mEq/L (ref 19–32)
Calcium: 8.9 mg/dL (ref 8.4–10.5)
Chloride: 95 mEq/L — ABNORMAL LOW (ref 96–112)
Glucose, Bld: 110 mg/dL — ABNORMAL HIGH (ref 70–99)
Potassium: 4.9 mEq/L (ref 3.5–5.1)
Sodium: 132 mEq/L — ABNORMAL LOW (ref 135–145)

## 2011-02-19 LAB — PROTIME-INR
INR: 1.9 — ABNORMAL HIGH
INR: 2.2 — ABNORMAL HIGH
Prothrombin Time: 22.3 — ABNORMAL HIGH
Prothrombin Time: 25.5 — ABNORMAL HIGH

## 2011-02-19 LAB — BASIC METABOLIC PANEL
CO2: 29
Calcium: 8.7
Calcium: 8.7
GFR calc Af Amer: >60
GFR calc Af Amer: >60
GFR calc non Af Amer: >60
GFR calc non Af Amer: >60
Potassium: 4.1
Potassium: 4.6
Sodium: 125 — ABNORMAL LOW
Sodium: 131 — ABNORMAL LOW

## 2011-02-19 LAB — CBC
HCT: 44.9
HCT: 47
Hemoglobin: 15.5
Hemoglobin: 16
Hemoglobin: 16.5
MCHC: 34.5
MCHC: 35
MCV: 101.4 — ABNORMAL HIGH
RBC: 4.4
RBC: 4.48
RBC: 4.63

## 2011-03-04 ENCOUNTER — Ambulatory Visit (INDEPENDENT_AMBULATORY_CARE_PROVIDER_SITE_OTHER): Payer: Medicare Other

## 2011-03-04 DIAGNOSIS — Z23 Encounter for immunization: Secondary | ICD-10-CM

## 2011-03-04 DIAGNOSIS — I4891 Unspecified atrial fibrillation: Secondary | ICD-10-CM

## 2011-03-04 LAB — POCT INR: INR: 2.3

## 2011-03-04 NOTE — Patient Instructions (Signed)
  Latest dosing instructions   Total Sun Mon Tue Wed Thu Fri Sat   42.5 5 mg 5 mg 7.5 mg 5 mg 7.5 mg 5 mg 7.5 mg    (5 mg1) (5 mg1) (5 mg1.5) (5 mg1) (5 mg1.5) (5 mg1) (5 mg1.5)       

## 2011-03-05 ENCOUNTER — Ambulatory Visit: Payer: Medicare Other

## 2011-03-09 ENCOUNTER — Other Ambulatory Visit: Payer: Self-pay | Admitting: Internal Medicine

## 2011-03-11 ENCOUNTER — Other Ambulatory Visit: Payer: Self-pay

## 2011-03-11 DIAGNOSIS — I4891 Unspecified atrial fibrillation: Secondary | ICD-10-CM

## 2011-03-11 MED ORDER — PROPAFENONE HCL ER 225 MG PO CP12: ORAL_CAPSULE | ORAL | Status: DC

## 2011-03-11 NOTE — Telephone Encounter (Signed)
..   Requested Prescriptions   Pending Prescriptions Disp Refills  . propafenone (RYTHMOL SR) 225 MG 12 hr capsule 90 capsule 11    Sig: 1 and 1/2 tablets twice daily

## 2011-03-30 NOTE — Discharge Summary (Cosign Needed)
NAMEMarland Kitchen  SHIA, EBER NO.:  [id number]  MEDICAL RECORD NO.:  [id number]  LOCATION:                                 FACILITY:  PHYSICIAN:  Pricilla Riffle, MD, FACCDATE OF BIRTH:  1941/04/18  DATE OF ADMISSION:  06/23/2009 DATE OF DISCHARGE:  06/24/2009                              DISCHARGE SUMMARY   PRIMARY CARDIOLOGIST:  Dr. Charlton Haws.  DISCHARGE DIAGNOSIS:  Impending sense of doom.  SECONDARY DIAGNOSES: 1. Nonischemic cardiomyopathy. 2. Moderate mitral regurgitation. 3. Hypertension. 4. Osteoarthritis. 5. Gastroesophageal reflux disease. 6. History of cerebral aneurysm status post clipping. 7. Atrial fibrillation.  ALLERGIES:  No known drug allergies.  PROCEDURES:  None.  HISTORY OF PRESENT ILLNESS:  This is a 70 year old gentleman with the above problem list who presented to the [facility] ED on June 23, 2009, with complaints of a sense of a doom.  He was noted to be hypertensive. He was admitted by [facility].  HOSPITAL COURSE:  The patient's labs were unrevealing and his sense of doom had resolved.  He was subsequently discharged home on June 24, 2009.  DISCHARGE LABORATORY DATA:  Hemoglobin 15.7, hematocrit 46.4, WBC 6.9, platelets 194.  INR 2.11.  Sodium 124, potassium 3.4, chloride 92, CO2 of 26, BUN 9, creatinine 0.76, glucose 114.  LFTs within normal limits. Cardiac enzymes negative x2.  Urinalysis was negative.  DISPOSITION:  The patient was discharged home on June 24, 2009. There is no record of what his discharge medications were as this was performed 22 months ago.  OUTSTANDING LAB STUDIES:  None.  DURATION OF THIS DISCHARGE ENCOUNTER:  Five minutes.     Nicolasa Ducking, ANP   ______________________________ Pricilla Riffle, MD, [facility]    CB/MEDQ  D:  03/29/2011  T:  03/30/2011  Job:  [id number]

## 2011-04-03 ENCOUNTER — Ambulatory Visit (INDEPENDENT_AMBULATORY_CARE_PROVIDER_SITE_OTHER): Payer: Medicare Other | Admitting: Internal Medicine

## 2011-04-03 DIAGNOSIS — I4891 Unspecified atrial fibrillation: Secondary | ICD-10-CM

## 2011-04-03 LAB — POCT INR: INR: 2.3

## 2011-04-03 NOTE — Patient Instructions (Signed)
  Latest dosing instructions   Total Sun Mon Tue Wed Thu Fri Sat   42.5 5 mg 5 mg 7.5 mg 5 mg 7.5 mg 5 mg 7.5 mg    (5 mg1) (5 mg1) (5 mg1.5) (5 mg1) (5 mg1.5) (5 mg1) (5 mg1.5)       

## 2011-04-04 ENCOUNTER — Ambulatory Visit: Payer: Medicare Other

## 2011-04-05 ENCOUNTER — Other Ambulatory Visit: Payer: Self-pay | Admitting: Internal Medicine

## 2011-04-11 ENCOUNTER — Telehealth: Payer: Self-pay | Admitting: *Deleted

## 2011-04-11 DIAGNOSIS — I4891 Unspecified atrial fibrillation: Secondary | ICD-10-CM

## 2011-04-11 NOTE — Telephone Encounter (Signed)
Fax received from [facility] on [address]. RX refill for Propafenone (Rythmol) was sent in for SR capsules with instructions to to 1 & 1/2 twice daily. Since SR are capsules, I called the pharmacy to make them aware he should be on Propafenone 225mg  tablets- take 1 & 1/2 tablets by mouth twice daily.

## 2011-05-05 ENCOUNTER — Ambulatory Visit (INDEPENDENT_AMBULATORY_CARE_PROVIDER_SITE_OTHER): Payer: Medicare Other | Admitting: Internal Medicine

## 2011-05-05 DIAGNOSIS — Z7901 Long term (current) use of anticoagulants: Secondary | ICD-10-CM

## 2011-05-05 DIAGNOSIS — I4891 Unspecified atrial fibrillation: Secondary | ICD-10-CM

## 2011-05-05 LAB — POCT INR: INR: 3.6

## 2011-05-05 NOTE — Patient Instructions (Signed)
  Latest dosing instructions   Total Sun Mon Tue Wed Thu Fri Sat   42.5 5 mg 5 mg 7.5 mg 5 mg 7.5 mg 5 mg 7.5 mg    (5 mg1) (5 mg1) (5 mg1.5) (5 mg1) (5 mg1.5) (5 mg1) (5 mg1.5)       

## 2011-05-06 ENCOUNTER — Encounter (INDEPENDENT_AMBULATORY_CARE_PROVIDER_SITE_OTHER): Payer: Self-pay | Admitting: Surgery

## 2011-05-12 ENCOUNTER — Ambulatory Visit (INDEPENDENT_AMBULATORY_CARE_PROVIDER_SITE_OTHER): Payer: Medicare Other | Admitting: Surgery

## 2011-05-12 ENCOUNTER — Encounter (INDEPENDENT_AMBULATORY_CARE_PROVIDER_SITE_OTHER): Payer: Self-pay | Admitting: Surgery

## 2011-05-12 ENCOUNTER — Encounter (INDEPENDENT_AMBULATORY_CARE_PROVIDER_SITE_OTHER): Payer: Self-pay | Admitting: General Surgery

## 2011-05-12 VITALS — BP 148/90 | HR 64 | Temp 97.9°F | Resp 16 | Ht 68.0 in | Wt 184.2 lb

## 2011-05-12 DIAGNOSIS — K4091 Unilateral inguinal hernia, without obstruction or gangrene, recurrent: Secondary | ICD-10-CM | POA: Insufficient documentation

## 2011-05-12 NOTE — Progress Notes (Signed)
Patient ID: Travis [name], male   DOB: 03-30-1941, 70 y.o.   MRN: [id number]  Chief Complaint  Patient presents with  . New Evaluation    eval of LIH     HPI Travis [name] is a 70 y.o. male.   HPIThis is a very pleasant gentleman referred by Dr. Darryll Capers for evaluation of a left inguinal hernia. He has had the hernia recurred for some time but is now getting increasingly symptomatic. He has pain but no obstructive symptoms. The pain is moderate in intensity. He had a previous left inguinal hernia repaired approximately 30 years  Past Medical History  Diagnosis Date  . Arthritis   . Hypertension   . GERD (gastroesophageal reflux disease)   . Cardiomyopathy secondary     Tachycardia-induced cardiomyopathy. Echo April 2009 EF 20-25%. June 2009--55%  . Status post clamping of cerebral aneurysm   . Sinus bradycardia   . Paroxysmal atrial fibrillation     On Rythmol  . Arrhythmia   . Heart murmur     Past Surgical History  Procedure Date  . Cerebral anuersym post clips   . Rotator cuff repair   . Fractured left arm   . Tonsillectomy   . Hernia repair   . Left tendon repair     Family History  Problem Relation Age of Onset  . Stroke Father   . Lung cancer Mother 45  . Stroke Mother   . Colon cancer Neg Hx     Social History History  Substance Use Topics  . Smoking status: Former Smoker    Quit date: 05/27/1983  . Smokeless tobacco: Never Used  . Alcohol Use: 1.2 oz/week    2 Glasses of wine per week     2 daily     No Known Allergies  Current Outpatient Prescriptions  Medication Sig Dispense Refill  . ALPRAZolam (XANAX) 0.5 MG tablet Take 0.5 mg by mouth every 6 (six) hours as needed.        Marland Kitchen amLODipine (NORVASC) 5 MG tablet Take 1 tablet (5 mg total) by mouth daily.  30 tablet  3  . b complex vitamins tablet Take 1 tablet by mouth daily.        Marland Kitchen doxepin (SINEQUAN) 10 MG capsule TAKE ONE CAPSULE BY MOUTH AT BEDTIME  30 capsule  5  . finasteride (PROPECIA)  1 MG tablet Take 1 mg by mouth daily.        . finasteride (PROSCAR) 5 MG tablet TAKE 1 TABLET EVERY DAY  30 tablet  2  . fish oil-omega-3 fatty acids 1000 MG capsule Take 1 g by mouth daily.        . Flaxseed, Linseed, (RA FLAX SEED OIL 1000 PO) Take by mouth 3 (three) times daily.        . furosemide (LASIX) 20 MG tablet TAKE 1 TABLET EVERY DAY  30 tablet  9  . Glucosamine Sulfate (GLUCOSAMINE RELIEF) 1000 MG TABS Take 1 tablet by mouth 2 (two) times daily.        . meloxicam (MOBIC) 15 MG tablet TAKE 1 TABLET EVERY DAY  30 tablet  5  . MICARDIS 80 MG tablet TAKE 1 TABLET EVERY DAY  30 tablet  9  . Milk Thistle 500 MG CAPS Take by mouth 2 (two) times daily.       . niacinamide 500 MG tablet Take 500 mg by mouth. 2 tabs once daily        . omeprazole (  PRILOSEC) 20 MG capsule Take 1 capsule (20 mg total) by mouth daily.  30 capsule  12  . propafenone (RYTHMOL) 225 MG tablet Take one and a half tablets by mouth twice daily      . warfarin (COUMADIN) 5 MG tablet TAKE 1 TABLET EVERY DAY OR AS DIRECTED  60 tablet  5    Review of Systems Review of Systems  Constitutional: Negative.   HENT: Negative.   Eyes: Negative.   Respiratory: Positive for cough.   Cardiovascular: Positive for palpitations. Negative for chest pain.  Genitourinary: Negative.   Musculoskeletal: Positive for arthralgias.  Neurological: Negative.   Hematological: Negative.   Psychiatric/Behavioral: Negative.     Blood pressure 148/90, pulse 64, temperature 97.9 F (36.6 C), temperature source Temporal, resp. rate 16, height 5\' 8"  (1.727 m), weight 184 lb 4 oz (83.575 kg).  Physical Exam Physical Exam  Constitutional: He is oriented to person, place, and time. He appears well-developed and well-nourished. No distress.  HENT:  Head: Normocephalic and atraumatic.  Right Ear: External ear normal.  Left Ear: External ear normal.  Nose: Nose normal.  Mouth/Throat: Oropharynx is clear and moist.  Eyes: Conjunctivae and  EOM are normal. Pupils are equal, round, and reactive to light. Left eye exhibits no discharge. No scleral icterus.  Neck: Normal range of motion. Neck supple. No tracheal deviation present. No thyromegaly present.  Cardiovascular: Normal heart sounds.   No murmur heard.      Heart is irregular rate and rhythm  Pulmonary/Chest: Effort normal and breath sounds normal. No respiratory distress. He has no wheezes.  Abdominal: Soft. Bowel sounds are normal. He exhibits no distension. There is no tenderness.       He has a well-healed incision in his left groin. There is a reducible left inguinal hernia. There is no evidence of right inguinal hernia or umbilical hernia  Musculoskeletal: Normal range of motion. He exhibits no edema and no tenderness.  Lymphadenopathy:    He has no cervical adenopathy.  Neurological: He is alert and oriented to person, place, and time.  Skin: Skin is warm and dry. No rash noted. No erythema.  Psychiatric: His behavior is normal. Judgment normal.    Data Reviewed I have reviewed notes from Epic from Dr. Lovell Sheehan and Dr. Graciela Husbands  Assessment    Patient with a recurrent left inguinal hernia    Plan    As it is symptomatic, repair is recommended with mesh. I discussed both laparoscopic and open techniques. As it is recurrent, I would like to proceed laparoscopically. I discussed the surgery with him in detail. I discussed the laparoscopic approach. I discussed the risks which include not limited to bleeding, infection, injury to surrounding structures, chronic pain, nerve entrapment, recurrence, etc. He understands and wishes to proceed. I will need to get preoperative cardiac clearance and he will have to stop his Coumadin prior to surgery. Likelihood of success is good       Gracielynn Birkel A 05/12/2011, 2:20 PM

## 2011-06-02 ENCOUNTER — Ambulatory Visit (INDEPENDENT_AMBULATORY_CARE_PROVIDER_SITE_OTHER): Payer: Medicare Other | Admitting: Internal Medicine

## 2011-06-02 ENCOUNTER — Telehealth: Payer: Self-pay | Admitting: Internal Medicine

## 2011-06-02 DIAGNOSIS — Z5181 Encounter for therapeutic drug level monitoring: Secondary | ICD-10-CM

## 2011-06-02 DIAGNOSIS — I4891 Unspecified atrial fibrillation: Secondary | ICD-10-CM | POA: Diagnosis not present

## 2011-06-02 DIAGNOSIS — Z7901 Long term (current) use of anticoagulants: Secondary | ICD-10-CM | POA: Diagnosis not present

## 2011-06-02 NOTE — Patient Instructions (Signed)
  Latest dosing instructions   Total Sun Mon Tue Wed Thu Fri Sat   42.5 5 mg 5 mg 7.5 mg 5 mg 7.5 mg 5 mg 7.5 mg    (5 mg1) (5 mg1) (5 mg1.5) (5 mg1) (5 mg1.5) (5 mg1) (5 mg1.5)

## 2011-06-02 NOTE — Telephone Encounter (Signed)
Spoke with pt, he is needing hernia repair and needs clearance and the okay to hold his coumadin prior to that procedure. Pt states he has been waiting awhile for the clearance. Will make dr Graciela Husbands and his nurse aware. The pt request a call as to what action is taken.

## 2011-06-02 NOTE — Telephone Encounter (Signed)
In the absence of change in symtoms he should be at acceptable risk for surgery and his coumadin can be held as per surgeons plan

## 2011-06-02 NOTE — Telephone Encounter (Signed)
faxed previous note to surgery [facility]

## 2011-06-02 NOTE — Telephone Encounter (Signed)
Pattricia Boss from [facility] called needing surgical clearance for patient to have lf.inquinal hernia repair and needs advice on coumadin,ok to hold 5 days prior to surgery.

## 2011-06-02 NOTE — Telephone Encounter (Signed)
New problem Pt is also calling about this issue please call

## 2011-06-02 NOTE — Telephone Encounter (Signed)
New problem    Status of cardiac clearance .  

## 2011-06-03 ENCOUNTER — Encounter: Payer: Self-pay | Admitting: *Deleted

## 2011-06-03 ENCOUNTER — Other Ambulatory Visit (INDEPENDENT_AMBULATORY_CARE_PROVIDER_SITE_OTHER): Payer: Self-pay | Admitting: Surgery

## 2011-06-03 NOTE — Telephone Encounter (Signed)
I spoke with Pattricia Boss at [facility]. She did not receive a fax from Dr. Graciela Husbands. They are on EPIC. I explained to her that I would do a letter of clearance in his chart and she can obtain that from [facility].

## 2011-06-06 ENCOUNTER — Other Ambulatory Visit: Payer: Self-pay | Admitting: Dermatology

## 2011-06-06 DIAGNOSIS — D485 Neoplasm of uncertain behavior of skin: Secondary | ICD-10-CM | POA: Diagnosis not present

## 2011-06-06 DIAGNOSIS — D2320 Other benign neoplasm of skin of unspecified ear and external auricular canal: Secondary | ICD-10-CM | POA: Diagnosis not present

## 2011-06-06 DIAGNOSIS — L820 Inflamed seborrheic keratosis: Secondary | ICD-10-CM | POA: Diagnosis not present

## 2011-06-06 DIAGNOSIS — L259 Unspecified contact dermatitis, unspecified cause: Secondary | ICD-10-CM | POA: Diagnosis not present

## 2011-06-17 ENCOUNTER — Encounter (HOSPITAL_COMMUNITY): Payer: Self-pay | Admitting: Pharmacy Technician

## 2011-06-24 ENCOUNTER — Encounter (HOSPITAL_COMMUNITY)
Admission: RE | Admit: 2011-06-24 | Discharge: 2011-06-24 | Disposition: A | Discharge status: Home / Self Care | Payer: Medicare Other | Source: Ambulatory Visit | Attending: Anesthesiology | Admitting: Anesthesiology

## 2011-06-24 ENCOUNTER — Encounter (HOSPITAL_COMMUNITY)
Admission: RE | Admit: 2011-06-24 | Discharge: 2011-06-24 | Disposition: A | Discharge status: Home / Self Care | Payer: Medicare Other | Source: Ambulatory Visit | Attending: Surgery | Admitting: Surgery

## 2011-06-24 ENCOUNTER — Other Ambulatory Visit: Payer: Self-pay

## 2011-06-24 ENCOUNTER — Encounter (HOSPITAL_COMMUNITY): Payer: Self-pay

## 2011-06-24 DIAGNOSIS — I499 Cardiac arrhythmia, unspecified: Secondary | ICD-10-CM | POA: Diagnosis not present

## 2011-06-24 DIAGNOSIS — K219 Gastro-esophageal reflux disease without esophagitis: Secondary | ICD-10-CM | POA: Diagnosis not present

## 2011-06-24 DIAGNOSIS — Z01811 Encounter for preprocedural respiratory examination: Secondary | ICD-10-CM | POA: Diagnosis not present

## 2011-06-24 DIAGNOSIS — Z01818 Encounter for other preprocedural examination: Secondary | ICD-10-CM | POA: Diagnosis not present

## 2011-06-24 DIAGNOSIS — Z01810 Encounter for preprocedural cardiovascular examination: Secondary | ICD-10-CM | POA: Diagnosis not present

## 2011-06-24 DIAGNOSIS — Z01812 Encounter for preprocedural laboratory examination: Secondary | ICD-10-CM | POA: Diagnosis not present

## 2011-06-24 DIAGNOSIS — I10 Essential (primary) hypertension: Secondary | ICD-10-CM | POA: Diagnosis not present

## 2011-06-24 DIAGNOSIS — K4091 Unilateral inguinal hernia, without obstruction or gangrene, recurrent: Secondary | ICD-10-CM | POA: Diagnosis not present

## 2011-06-24 LAB — CBC
HCT: 41.6 % (ref 39.0–52.0)
MCV: 95.2 fL (ref 78.0–100.0)
RDW: 12.8 % (ref 11.5–15.5)
WBC: 6.3 10*3/uL (ref 4.0–10.5)

## 2011-06-24 LAB — PROTIME-INR: INR: 3.75 — ABNORMAL HIGH (ref 0.00–1.49)

## 2011-06-24 LAB — BASIC METABOLIC PANEL
BUN: 8 mg/dL (ref 6–23)
CO2: 28 mEq/L (ref 19–32)
Chloride: 88 mEq/L — ABNORMAL LOW (ref 96–112)
Creatinine, Ser: 0.65 mg/dL (ref 0.50–1.35)
Glucose, Bld: 122 mg/dL — ABNORMAL HIGH (ref 70–99)

## 2011-06-24 NOTE — Pre-Procedure Instructions (Addendum)
20 Travis [name]  06/24/2011   Your procedure is scheduled on: 2.6.13  Report to [facility] at 950 AM.  Call this number if you have problems the morning of surgery: [phone]   Remember:   Do not eat food:After Midnight.  May have clear liquids: up to 4 Hours before arrival.  Clear liquids include soda, tea, black coffee, apple or grape juice, broth.  Take these medicines the morning of surgery with A SIP OF WATER: xanax, , proscar, 39meprozole,propafenone             STOP   All otc meds--flaxseed, fishoil, glucosamine,milk thistle,  Also mobic, and coumadin 2/1   Do not wear jewelry, make-up or nail polish.  Do not wear lotions, powders, or perfumes. You may wear deodorant.  Do not shave 48 hours prior to surgery.  Do not bring valuables to the hospital.  Contacts, dentures or bridgework may not be worn into surgery.  Leave suitcase in the car. After surgery it may be brought to your room.  For patients admitted to the hospital, checkout time is 11:00 AM the day of discharge.   Patients discharged the day of surgery will not be allowed to drive home.  Name and phone number of your driver: [email or web address] [phone]  Special Instructions: CHG Shower Use Special Wash: 1/2 bottle night before surgery and 1/2 bottle morning of surgery.   Please read over the following fact sheets that you were given: Pain Booklet, Coughing and Deep Breathing, MRSA Information and Surgical Site Infection Prevention

## 2011-06-24 NOTE — Progress Notes (Signed)
CHART GIVEN TO [facility] TO REVIEW ABN LABS

## 2011-06-24 NOTE — Progress Notes (Signed)
Note from dr Graciela Husbands in epic from 7.12 last visit, Echo from 09, ekg from 7/12 . New one done 06/24/11

## 2011-06-25 ENCOUNTER — Telehealth (INDEPENDENT_AMBULATORY_CARE_PROVIDER_SITE_OTHER): Payer: Self-pay

## 2011-06-25 ENCOUNTER — Encounter (HOSPITAL_COMMUNITY): Payer: Self-pay | Admitting: Vascular Surgery

## 2011-06-25 ENCOUNTER — Telehealth: Payer: Self-pay | Admitting: *Deleted

## 2011-06-25 NOTE — Telephone Encounter (Signed)
Spoke to patient and told him to stop taking his coumadin now for his procedure on the 6th.

## 2011-06-25 NOTE — Telephone Encounter (Signed)
Travis [name] from pre admit called to inform you of labs on patient incase you wanted to order anything closer to day of surgery or day of. INR-3.75, PTT-56, Sodium- 124, Chloride-88. Patient is currently taking coumadin.

## 2011-06-25 NOTE — Consult Note (Signed)
Anesthesia:  Patient is a 71 year old male scheduled for a laparoscopic left IHR on 07/02/11.  His history includes HTN, paroxsymal afib, tachycardia-induced CM (echo April 2009 EF 20-25%, June 2009 55%), GERD, His Cardiologist is Dr. Graciela Husbands who cleared him for this procedure.  PCP is Dr. Darryll Capers.  Labs noted.  Na 124, Cl 88.  INR is 3.75, PT 37.6 and PTT is 56.  He will need a repeat BMET, PT/PTT on the day of surgery (ordered). I called these results to [facility] Triage Nurse Marcelino Duster who will review with Dr. Magnus Ivan.  I also called his PT/PTT to [facility] who spoke with Dr. Lovell Sheehan.  They are going to call him today and instruct him to stop his Coumadin today (versus on Friday 06/26/10) .  Of note, he reports he is prone to hyponatremia.  His last Na was up to 132, but since August 2011, they have primarily been 122-128.  EKG from 06/24/11 showed SR with first degree AVB, left BBB.  The left BBB was also noted on his [facility] EKG from 12/10/10.  Echo from 12/07/07 (read by Dr.Katz) showed:  "- Left ventricular size was at the upper limits of normal. Overall eft ventricular systolic function was at the lower limits of normal. Left ventricular ejection fraction was estimated to be 55 %. There were no left ventricular regional wall motion abnormalities. Doppler parameters were consistent with abnormal left ventricular relaxation. - Aortic valve thickness was moderately increased. There was trivial aortic valvular regurgitation. The mean transaortic valve gradient was 9 mmHg. Estimated aortic valve area (by VTI) was 2.03 cm^2. Estimated aortic valve area (by Vmax) was 2 cm^2. - There was mild mitral valvular regurgitation. The mitral regurgitation jet was eccentric and directed posteriorly. - The left atrium was mildly dilated. - LV function is significantly improved. There is question of a target in the LVOT. This has been described in the past. A TEE has been done, but I do not  have a report available. This target in the LVOT does not seem to cause hemodynamic consequences. The TEE report is needed to confirm."  (In April of 2009 there was mention of considering a TEE, but I don't see that one was actually done.)  His last stress test was > 3 year ago.  CXR from 06/24/11 showed no acute process.  Anticipate that if his follow-up labs are reasonable and no significant change is his cardiopulmonary status, that he can proceed.

## 2011-07-01 MED ORDER — CEFAZOLIN SODIUM 1-5 GM-% IV SOLN: 1.0000 g | INTRAVENOUS | Status: DC

## 2011-07-01 MED ORDER — CEFAZOLIN SODIUM-DEXTROSE 2-3 GM-% IV SOLR
2.0000 g | INTRAVENOUS | Status: AC
  Administered 2011-07-02: 2 g via INTRAVENOUS
  Filled 2011-07-01: qty 50

## 2011-07-01 NOTE — H&P (Signed)
Chief Complaint   Patient presents with   .  New Evaluation     eval of LIH    HPI  Travis [name] is a 71 y.o. male.  HPIThis is a very pleasant gentleman referred by Dr. Darryll Capers for evaluation of a left inguinal hernia. He has had the hernia recurred for some time but is now getting increasingly symptomatic. He has pain but no obstructive symptoms. The pain is moderate in intensity. He had a previous left inguinal hernia repaired approximately 30 years  Past Medical History   Diagnosis  Date   .  Arthritis    .  Hypertension    .  GERD (gastroesophageal reflux disease)    .  Cardiomyopathy secondary      Tachycardia-induced cardiomyopathy. Echo April 2009 EF 20-25%. June 2009--55%   .  Status post clamping of cerebral aneurysm    .  Sinus bradycardia    .  Paroxysmal atrial fibrillation      On Rythmol   .  Arrhythmia    .  Heart murmur     Past Surgical History   Procedure  Date   .  Cerebral anuersym post clips    .  Rotator cuff repair    .  Fractured left arm    .  Tonsillectomy    .  Hernia repair    .  Left tendon repair     Family History   Problem  Relation  Age of Onset   .  Stroke  Father    .  Lung cancer  Mother  29   .  Stroke  Mother    .  Colon cancer  Neg Hx     Social History  History   Substance Use Topics   .  Smoking status:  Former Smoker     Quit date:  05/27/1983   .  Smokeless tobacco:  Never Used   .  Alcohol Use:  1.2 oz/week     2 Glasses of wine per week      2 daily    No Known Allergies  Current Outpatient Prescriptions   Medication  Sig  Dispense  Refill   .  ALPRAZolam (XANAX) 0.5 MG tablet  Take 0.5 mg by mouth every 6 (six) hours as needed.     Marland Kitchen  amLODipine (NORVASC) 5 MG tablet  Take 1 tablet (5 mg total) by mouth daily.  30 tablet  3   .  b complex vitamins tablet  Take 1 tablet by mouth daily.     Marland Kitchen  doxepin (SINEQUAN) 10 MG capsule  TAKE ONE CAPSULE BY MOUTH AT BEDTIME  30 capsule  5   .  finasteride (PROPECIA) 1 MG  tablet  Take 1 mg by mouth daily.     .  finasteride (PROSCAR) 5 MG tablet  TAKE 1 TABLET EVERY DAY  30 tablet  2   .  fish oil-omega-3 fatty acids 1000 MG capsule  Take 1 g by mouth daily.     .  Flaxseed, Linseed, (RA FLAX SEED OIL 1000 PO)  Take by mouth 3 (three) times daily.     .  furosemide (LASIX) 20 MG tablet  TAKE 1 TABLET EVERY DAY  30 tablet  9   .  Glucosamine Sulfate (GLUCOSAMINE RELIEF) 1000 MG TABS  Take 1 tablet by mouth 2 (two) times daily.     .  meloxicam (MOBIC) 15 MG tablet  TAKE 1 TABLET EVERY  DAY  30 tablet  5   .  MICARDIS 80 MG tablet  TAKE 1 TABLET EVERY DAY  30 tablet  9   .  Milk Thistle 500 MG CAPS  Take by mouth 2 (two) times daily.     .  niacinamide 500 MG tablet  Take 500 mg by mouth. 2 tabs once daily     .  omeprazole (PRILOSEC) 20 MG capsule  Take 1 capsule (20 mg total) by mouth daily.  30 capsule  12   .  propafenone (RYTHMOL) 225 MG tablet  Take one and a half tablets by mouth twice daily     .  warfarin (COUMADIN) 5 MG tablet  TAKE 1 TABLET EVERY DAY OR AS DIRECTED  60 tablet  5    Review of Systems  Review of Systems  Constitutional: Negative.  HENT: Negative.  Eyes: Negative.  Respiratory: Positive for cough.  Cardiovascular: Positive for palpitations. Negative for chest pain.  Genitourinary: Negative.  Musculoskeletal: Positive for arthralgias.  Neurological: Negative.  Hematological: Negative.  Psychiatric/Behavioral: Negative.   Blood pressure 148/90, pulse 64, temperature 97.9 F (36.6 C), temperature source Temporal, resp. rate 16, height 5\' 8"  (1.727 m), weight 184 lb 4 oz (83.575 kg).  Physical Exam  Physical Exam  Constitutional: He is oriented to person, place, and time. He appears well-developed and well-nourished. No distress.  HENT:  Head: Normocephalic and atraumatic.  Right Ear: External ear normal.  Left Ear: External ear normal.  Nose: Nose normal.  Mouth/Throat: Oropharynx is clear and moist.  Eyes: Conjunctivae and EOM  are normal. Pupils are equal, round, and reactive to light. Left eye exhibits no discharge. No scleral icterus.  Neck: Normal range of motion. Neck supple. No tracheal deviation present. No thyromegaly present.  Cardiovascular: Normal heart sounds.  No murmur heard. Heart is irregular rate and rhythm  Pulmonary/Chest: Effort normal and breath sounds normal. No respiratory distress. He has no wheezes.  Abdominal: Soft. Bowel sounds are normal. He exhibits no distension. There is no tenderness.  He has a well-healed incision in his left groin. There is a reducible left inguinal hernia. There is no evidence of right inguinal hernia or umbilical hernia  Musculoskeletal: Normal range of motion. He exhibits no edema and no tenderness.  Lymphadenopathy:  He has no cervical adenopathy.  Neurological: He is alert and oriented to person, place, and time.  Skin: Skin is warm and dry. No rash noted. No erythema.  Psychiatric: His behavior is normal. Judgment normal.   Data Reviewed  I have reviewed notes from Epic from Dr. Lovell Sheehan and Dr. Graciela Husbands  Assessment   Patient with a recurrent left inguinal hernia   Plan   As it is symptomatic, repair is recommended with mesh. I discussed both laparoscopic and open techniques. As it is recurrent, I would like to proceed laparoscopically. I discussed the surgery with him in detail. I discussed the laparoscopic approach. I discussed the risks which include not limited to bleeding, infection, injury to surrounding structures, chronic pain, nerve entrapment, recurrence, etc. He understands and wishes to proceed. I will need to get preoperative cardiac clearance and he will have to stop his Coumadin prior to surgery. Likelihood of success is good  Cardiac clearance achieved pre op  Kinzlie Harney A

## 2011-07-02 ENCOUNTER — Ambulatory Visit (HOSPITAL_COMMUNITY): Payer: Medicare Other | Admitting: Vascular Surgery

## 2011-07-02 ENCOUNTER — Ambulatory Visit (HOSPITAL_COMMUNITY)
Admission: RE | Admit: 2011-07-02 | Discharge: 2011-07-02 | Disposition: A | Discharge status: Home Health | Payer: Medicare Other | Source: Ambulatory Visit | Attending: Surgery | Admitting: Surgery

## 2011-07-02 ENCOUNTER — Encounter (HOSPITAL_COMMUNITY): Payer: Self-pay | Admitting: Vascular Surgery

## 2011-07-02 ENCOUNTER — Encounter (HOSPITAL_COMMUNITY)
Admission: RE | Disposition: A | Discharge status: Home Health | Payer: Self-pay | Source: Ambulatory Visit | Attending: Surgery

## 2011-07-02 DIAGNOSIS — K219 Gastro-esophageal reflux disease without esophagitis: Secondary | ICD-10-CM | POA: Diagnosis not present

## 2011-07-02 DIAGNOSIS — K4091 Unilateral inguinal hernia, without obstruction or gangrene, recurrent: Secondary | ICD-10-CM | POA: Diagnosis not present

## 2011-07-02 DIAGNOSIS — I10 Essential (primary) hypertension: Secondary | ICD-10-CM | POA: Diagnosis not present

## 2011-07-02 DIAGNOSIS — Z01818 Encounter for other preprocedural examination: Secondary | ICD-10-CM | POA: Insufficient documentation

## 2011-07-02 DIAGNOSIS — Z01810 Encounter for preprocedural cardiovascular examination: Secondary | ICD-10-CM | POA: Insufficient documentation

## 2011-07-02 DIAGNOSIS — Z01812 Encounter for preprocedural laboratory examination: Secondary | ICD-10-CM | POA: Diagnosis not present

## 2011-07-02 DIAGNOSIS — K4090 Unilateral inguinal hernia, without obstruction or gangrene, not specified as recurrent: Secondary | ICD-10-CM | POA: Diagnosis not present

## 2011-07-02 HISTORY — PX: INGUINAL HERNIA REPAIR: SHX194

## 2011-07-02 LAB — APTT: aPTT: 29 seconds (ref 24–37)

## 2011-07-02 LAB — BASIC METABOLIC PANEL
CO2: 25 mEq/L (ref 19–32)
Calcium: 9 mg/dL (ref 8.4–10.5)
GFR calc non Af Amer: >90 mL/min (ref >90)
Sodium: 129 mEq/L — ABNORMAL LOW (ref 135–145)

## 2011-07-02 LAB — PROTIME-INR
INR: 0.92 (ref 0.00–1.49)
Prothrombin Time: 12.6 seconds (ref 11.6–15.2)

## 2011-07-02 SURGERY — REPAIR, HERNIA, INGUINAL, LAPAROSCOPIC
Anesthesia: General | Site: Groin | Laterality: Left | Wound class: Clean

## 2011-07-02 MED ORDER — MIDAZOLAM HCL 5 MG/5ML IJ SOLN
INTRAMUSCULAR | Status: DC | PRN
  Administered 2011-07-02: 2 mg via INTRAVENOUS

## 2011-07-02 MED ORDER — HYDROCODONE-ACETAMINOPHEN 5-325 MG PO TABS: 1.0000 | ORAL_TABLET | ORAL | Status: AC | PRN

## 2011-07-02 MED ORDER — 0.9 % SODIUM CHLORIDE (POUR BTL) OPTIME
TOPICAL | Status: DC | PRN
  Administered 2011-07-02: 1000 mL

## 2011-07-02 MED ORDER — BUPIVACAINE LIPOSOME 1.3 % IJ SUSP
20.0000 mL | INTRAMUSCULAR | Status: AC
  Administered 2011-07-02: 20 mL
  Filled 2011-07-02: qty 20

## 2011-07-02 MED ORDER — PROPOFOL 10 MG/ML IV EMUL
INTRAVENOUS | Status: DC | PRN
  Administered 2011-07-02: 160 mg via INTRAVENOUS

## 2011-07-02 MED ORDER — MEPERIDINE HCL 25 MG/ML IJ SOLN: 6.2500 mg | INTRAMUSCULAR | Status: DC | PRN

## 2011-07-02 MED ORDER — ROCURONIUM BROMIDE 100 MG/10ML IV SOLN
INTRAVENOUS | Status: DC | PRN
  Administered 2011-07-02: 40 mg via INTRAVENOUS

## 2011-07-02 MED ORDER — FENTANYL CITRATE 0.05 MG/ML IJ SOLN
INTRAMUSCULAR | Status: DC | PRN
  Administered 2011-07-02: 100 ug via INTRAVENOUS
  Administered 2011-07-02: 50 ug via INTRAVENOUS

## 2011-07-02 MED ORDER — ONDANSETRON HCL 4 MG/2ML IJ SOLN: 4.0000 mg | Freq: Once | INTRAMUSCULAR | Status: DC | PRN

## 2011-07-02 MED ORDER — MORPHINE SULFATE 2 MG/ML IJ SOLN: 0.0500 mg/kg | INTRAMUSCULAR | Status: DC | PRN

## 2011-07-02 MED ORDER — LACTATED RINGERS IV SOLN
INTRAVENOUS | Status: DC | PRN
  Administered 2011-07-02 (×2): via INTRAVENOUS

## 2011-07-02 MED ORDER — NEOSTIGMINE METHYLSULFATE 1 MG/ML IJ SOLN
INTRAMUSCULAR | Status: DC | PRN
  Administered 2011-07-02: 4 mg via INTRAVENOUS

## 2011-07-02 MED ORDER — EPHEDRINE SULFATE 50 MG/ML IJ SOLN
INTRAMUSCULAR | Status: DC | PRN
  Administered 2011-07-02: 10 mg via INTRAVENOUS

## 2011-07-02 MED ORDER — HYDROMORPHONE HCL PF 1 MG/ML IJ SOLN
0.2500 mg | INTRAMUSCULAR | Status: DC | PRN
  Administered 2011-07-02: 0.5 mg via INTRAVENOUS

## 2011-07-02 MED ORDER — ONDANSETRON HCL 4 MG/2ML IJ SOLN
INTRAMUSCULAR | Status: DC | PRN
  Administered 2011-07-02: 4 mg via INTRAVENOUS

## 2011-07-02 MED ORDER — GLYCOPYRROLATE 0.2 MG/ML IJ SOLN
INTRAMUSCULAR | Status: DC | PRN
  Administered 2011-07-02: .7 mg via INTRAVENOUS

## 2011-07-02 SURGICAL SUPPLY — 40 items
APL SKNCLS STERI-STRIP NONHPOA (GAUZE/BANDAGES/DRESSINGS)
APPLIER CLIP LOGIC TI 5 (MISCELLANEOUS) IMPLANT
APR CLP MED LRG 33X5 (MISCELLANEOUS)
BANDAGE ADHESIVE 1X3 (GAUZE/BANDAGES/DRESSINGS) ×3 IMPLANT
BENZOIN TINCTURE PRP APPL 2/3 (GAUZE/BANDAGES/DRESSINGS) ×1 IMPLANT
BLADE SURG ROTATE 9660 (MISCELLANEOUS) ×2 IMPLANT
CANISTER SUCTION 2500CC (MISCELLANEOUS) IMPLANT
CHLORAPREP W/TINT 26ML (MISCELLANEOUS) ×2 IMPLANT
CLOTH BEACON ORANGE TIMEOUT ST (SAFETY) ×2 IMPLANT
COVER SURGICAL LIGHT HANDLE (MISCELLANEOUS) ×2 IMPLANT
DEVICE SECURE STRAP 25 ABSORB (INSTRUMENTS) ×2 IMPLANT
DISSECT BALLN SPACEMKR + OVL (BALLOONS) ×2
DISSECTOR BALLN SPACEMKR + OVL (BALLOONS) ×1 IMPLANT
DISSECTOR BLUNT TIP ENDO 5MM (MISCELLANEOUS) IMPLANT
ELECT REM PT RETURN 9FT ADLT (ELECTROSURGICAL) ×2
ELECTRODE REM PT RTRN 9FT ADLT (ELECTROSURGICAL) ×1 IMPLANT
GLOVE BIO SURGEON STRL SZ7 (GLOVE) ×1 IMPLANT
GLOVE BIO SURGEON STRL SZ7.5 (GLOVE) ×1 IMPLANT
GLOVE BIOGEL PI IND STRL 7.0 (GLOVE) IMPLANT
GLOVE BIOGEL PI IND STRL 7.5 (GLOVE) IMPLANT
GLOVE BIOGEL PI INDICATOR 7.0 (GLOVE) ×1
GLOVE BIOGEL PI INDICATOR 7.5 (GLOVE) ×1
GLOVE SS BIOGEL STRL SZ 6.5 (GLOVE) IMPLANT
GLOVE SUPERSENSE BIOGEL SZ 6.5 (GLOVE) ×1
GLOVE SURG SIGNA 7.5 PF LTX (GLOVE) ×2 IMPLANT
GOWN PREVENTION PLUS XLARGE (GOWN DISPOSABLE) ×2 IMPLANT
GOWN STRL NON-REIN LRG LVL3 (GOWN DISPOSABLE) ×4 IMPLANT
KIT BASIN OR (CUSTOM PROCEDURE TRAY) ×2 IMPLANT
KIT ROOM TURNOVER OR (KITS) ×2 IMPLANT
MESH ULTRAPRO 6X6 15CM15CM (Mesh General) ×1 IMPLANT
NS IRRIG 1000ML POUR BTL (IV SOLUTION) ×2 IMPLANT
PAD ARMBOARD 7.5X6 YLW CONV (MISCELLANEOUS) ×4 IMPLANT
SET IRRIG TUBING LAPAROSCOPIC (IRRIGATION / IRRIGATOR) IMPLANT
SET TROCAR LAP APPLE-HUNT 5MM (ENDOMECHANICALS) ×2 IMPLANT
SUT MNCRL AB 4-0 PS2 18 (SUTURE) ×2 IMPLANT
TOWEL OR 17X24 6PK STRL BLUE (TOWEL DISPOSABLE) ×2 IMPLANT
TOWEL OR 17X26 10 PK STRL BLUE (TOWEL DISPOSABLE) ×2 IMPLANT
TRAY FOLEY CATH 14FR (SET/KITS/TRAYS/PACK) ×2 IMPLANT
TRAY LAPAROSCOPIC (CUSTOM PROCEDURE TRAY) ×2 IMPLANT
WATER STERILE IRR 1000ML POUR (IV SOLUTION) IMPLANT

## 2011-07-02 NOTE — Anesthesia Postprocedure Evaluation (Signed)
  Anesthesia Post-op Note  Patient: Travis [name]  Procedure(s) Performed:  LAPAROSCOPIC INGUINAL HERNIA - Laparoscopic left inguinal hernia repair and mesh  Patient Location: PACU  Anesthesia Type: General  Level of Consciousness: awake, alert  and oriented  Airway and Oxygen Therapy: Patient Spontanous Breathing and Patient connected to nasal cannula oxygen  Post-op Pain: mild  Post-op Assessment: Post-op Vital signs reviewed, Patient's Cardiovascular Status Stable, Respiratory Function Stable, Patent Airway, No signs of Nausea or vomiting and Pain level controlled  Post-op Vital Signs: Reviewed and stable  Complications: No apparent anesthesia complications

## 2011-07-02 NOTE — Interval H&P Note (Signed)
History and Physical Interval Note: he has had no change in his history or exam  07/02/2011 9:00 AM  Travis [name]  has presented today for surgery, with the diagnosis of inguinal hernia  The various methods of treatment have been discussed with the patient and family. After consideration of risks, benefits and other options for treatment, the patient has consented to  Procedure(s): LAPAROSCOPIC LEFT INGUINAL HERNIA REPAIR WITH MESH as a surgical intervention .  The patients' history has been reviewed, patient examined, no change in status, stable for surgery.  I have reviewed the patients' chart and labs.  Questions were answered to the patient's satisfaction.     Kerstyn Coryell A

## 2011-07-02 NOTE — Preoperative (Signed)
Beta Blockers   Reason not to administer Beta Blockers:Not Applicable. No home beta blockers 

## 2011-07-02 NOTE — Transfer of Care (Signed)
Immediate Anesthesia Transfer of Care Note  Patient: Travis [name]  Procedure(s) Performed:  LAPAROSCOPIC INGUINAL HERNIA - Laparoscopic left inguinal hernia repair and mesh  Patient Location: PACU  Anesthesia Type: General  Level of Consciousness: awake, alert  and oriented  Airway & Oxygen Therapy: Patient Spontanous Breathing and Patient connected to nasal cannula oxygen  Post-op Assessment: Report given to PACU RN and Post -op Vital signs reviewed and stable  Post vital signs: Reviewed  Complications: No apparent anesthesia complications

## 2011-07-02 NOTE — Anesthesia Procedure Notes (Signed)
Procedure Name: Intubation Date/Time: 07/02/2011 9:34 AM Performed by: Margaree Mackintosh Pre-anesthesia Checklist: Patient identified, Emergency Drugs available, Suction available, Patient being monitored and Timeout performed Patient Re-evaluated:Patient Re-evaluated prior to inductionOxygen Delivery Method: Circle System Utilized Preoxygenation: Pre-oxygenation with 100% oxygen Intubation Type: IV induction Ventilation: Mask ventilation without difficulty Laryngoscope Size: Miller and 2 Grade View: Grade III Tube type: Oral Tube size: 7.5 mm Number of attempts: 2 Airway Equipment and Method: stylet Placement Confirmation: ETT inserted through vocal cords under direct vision,  positive ETCO2 and breath sounds checked- equal and bilateral Secured at: 22 cm Tube secured with: Tape Dental Injury: Teeth and Oropharynx as per pre-operative assessment  Difficulty Due To: Difficulty was unanticipated and Difficult Airway- due to anterior larynx

## 2011-07-02 NOTE — Anesthesia Preprocedure Evaluation (Addendum)
Anesthesia Evaluation  Patient identified by MRN, date of birth, ID band Patient awake    Reviewed: Allergy & Precautions, H&P , NPO status , Patient's Chart, lab work & pertinent test results, reviewed documented beta blocker date and time   Airway Mallampati: II TM Distance: >3 FB Neck ROM: Full    Dental  (+) Teeth Intact and Dental Advisory Given   Pulmonary  clear to auscultation        Cardiovascular hypertension, Pt. on medications + dysrhythmias Atrial Fibrillation Regular Normal    Neuro/Psych    GI/Hepatic GERD-  Medicated and Controlled,  Endo/Other    Renal/GU      Musculoskeletal   Abdominal   Peds  Hematology   Anesthesia Other Findings   Reproductive/Obstetrics                          Anesthesia Physical Anesthesia Plan  ASA: II  Anesthesia Plan: General   Post-op Pain Management:    Induction: Intravenous  Airway Management Planned: Oral ETT  Additional Equipment:   Intra-op Plan:   Post-operative Plan: Extubation in OR  Informed Consent: I have reviewed the patients History and Physical, chart, labs and discussed the procedure including the risks, benefits and alternatives for the proposed anesthesia with the patient or authorized representative who has indicated his/her understanding and acceptance.   Dental advisory given  Plan Discussed with: Anesthesiologist, CRNA and Surgeon  Anesthesia Plan Comments:        Anesthesia Quick Evaluation

## 2011-07-02 NOTE — Op Note (Signed)
07/02/2011  10:17 AM  PATIENT:  Travis [name]  71 y.o. male  PRE-OPERATIVE DIAGNOSIS:  Recurrent left inguinal hernia  POST-OPERATIVE DIAGNOSIS:  Recurrent left inguinal hernia  PROCEDURE:  Procedure(s): LAPAROSCOPIC REPAIR RECURRENT LEFT INGUINAL HERNIA WITH MESH (6inch x 6inch ultrapro)  SURGEON:  Surgeon(s): Shelly Rubenstein, MD  PHYSICIAN ASSISTANT:   ASSISTANTS: none   ANESTHESIA:   general  EBL:  Total I/O In: 1000 [I.V.:1000] Out: 200 [Urine:200]  BLOOD ADMINISTERED:none  DRAINS: none   LOCAL MEDICATIONS USED:  BUPIVICAINE 20CC  SPECIMEN:  No Specimen  DISPOSITION OF SPECIMEN:  N/A  COUNTS:  YES  TOURNIQUET:  * No tourniquets in log *  DICTATION: .Dragon Dictation Indications: This is a 71 year old gentleman with a recurrent left inguinal hernia. The plan is to proceed with laparoscopic repair of the left recurrent inguinal hernia with mesh.  Procedure: The patient was brought to the operating room and identified as the correct patient. He was placed supine on the operating table and general anesthesia was induced. A Foley catheter was inserted. His abdomen was then prepped and draped in the usual sterile fashion. Using a scalpel and made a small vertical incision below the umbilicus. This was carried down to the fascia was then opened with a scalpel. The rectus muscle was then identified and elevated. The dissecting balloon was then passed underneath the rectus muscle and manipulated toward the pubis. The balloon was then insufflated under direct vision dissecting out the preperitoneal space. The dissecting balloon was then removed and insufflation was begun with carbon dioxide. Good dissection of the inguinal area appeared to be achieved the balloon. I placed 25 mm ports under direct vision in the lower midline. The patient had several fascial defects medial to the epigastric blood vessels. He also had an indirect inguinal hernia as well. I was able to separate the  cord structures from the left inguinal hernia sac. A 6" x 6" piece of polypropylene mesh is then brought onto the field. I fashioned it appropriately. I then placed into the port of the umbilicus. I then opened up the mesh and placed it over the left inguinal floor as an onlay. Using the absorbable tacks were, the mesh was secured to Cooper's ligament and up the medial abdominal wall and then slightly laterally.  Good coverage of the inguinal floor and hernia appeared to be achieved. Hemostasis appeared to be achieved. The midline ports were then removed under direct vision. The preperitoneal space collapsed appropriately leaving the mesh intact. The fascia at the umbilicus was closed with a figure-of-eight 0 Vicryl suture. All wounds were then anesthetized with bupivacaine. I performed a left ilioinguinal nerve block with bupivacaine as well. I then closed all skin incisions with 4-0 Monocryl subcuticular sutures. Steri-Strips and bandages were then applied. The patient tolerated the procedure well. All the counts were correct at the end of the procedure. The patient was then extubated in the operating room and taken in a stable condition to the recovery room. PLAN OF CARE: Discharge to home after PACU  PATIENT DISPOSITION:  PACU - hemodynamically stable.   Delay start of Pharmacological VTE agent (>24hrs) due to surgical blood loss or risk of bleeding:  {YES/NO/NOT APPLICABLE:20182

## 2011-07-04 ENCOUNTER — Encounter (HOSPITAL_COMMUNITY): Payer: Self-pay | Admitting: Surgery

## 2011-07-08 ENCOUNTER — Ambulatory Visit (INDEPENDENT_AMBULATORY_CARE_PROVIDER_SITE_OTHER): Payer: Medicare Other | Admitting: Internal Medicine

## 2011-07-08 DIAGNOSIS — I4891 Unspecified atrial fibrillation: Secondary | ICD-10-CM

## 2011-07-08 NOTE — Patient Instructions (Signed)
  Latest dosing instructions   Total Sun Mon Tue Wed Thu Fri Sat   42.5 5 mg 5 mg 7.5 mg 5 mg 7.5 mg 5 mg 7.5 mg    (5 mg1) (5 mg1) (5 mg1.5) (5 mg1) (5 mg1.5) (5 mg1) (5 mg1.5)       

## 2011-07-15 ENCOUNTER — Encounter (INDEPENDENT_AMBULATORY_CARE_PROVIDER_SITE_OTHER): Payer: Self-pay | Admitting: Surgery

## 2011-07-15 ENCOUNTER — Ambulatory Visit (INDEPENDENT_AMBULATORY_CARE_PROVIDER_SITE_OTHER): Payer: Medicare Other | Admitting: Surgery

## 2011-07-15 VITALS — BP 127/88 | HR 77 | Temp 98.6°F | Resp 22 | Ht 68.5 in | Wt 184.6 lb

## 2011-07-15 DIAGNOSIS — Z09 Encounter for follow-up examination after completed treatment for conditions other than malignant neoplasm: Secondary | ICD-10-CM

## 2011-07-15 NOTE — Progress Notes (Signed)
Subjective:     Patient ID: Travis [name], male   DOB: July 23, 1940, 71 y.o.   MRN: [id number]  HPI He is here for his first postoperative visit status post laparoscopic repair of recurrent left inguinal hernia with mesh. He is doing well has no complaints. He reports minimal discomfort postoperatively  Review of Systems     Objective:   Physical Exam On exam, his incisions are well healed. There is minimal swelling. There is no evidence of recurrence    Assessment:     Patient status post laparoscopic left inguinal hernia repair with mesh    Plan:     He will refrain from heavy lifting for one more week. I will see him back as needed

## 2011-07-20 ENCOUNTER — Other Ambulatory Visit: Payer: Self-pay | Admitting: Internal Medicine

## 2011-08-02 ENCOUNTER — Other Ambulatory Visit: Payer: Self-pay | Admitting: Internal Medicine

## 2011-08-04 ENCOUNTER — Encounter: Payer: Self-pay | Admitting: Internal Medicine

## 2011-08-04 ENCOUNTER — Ambulatory Visit (INDEPENDENT_AMBULATORY_CARE_PROVIDER_SITE_OTHER): Payer: Medicare Other | Admitting: Internal Medicine

## 2011-08-04 DIAGNOSIS — I4891 Unspecified atrial fibrillation: Secondary | ICD-10-CM

## 2011-08-04 DIAGNOSIS — H3400 Transient retinal artery occlusion, unspecified eye: Secondary | ICD-10-CM

## 2011-08-04 DIAGNOSIS — G453 Amaurosis fugax: Secondary | ICD-10-CM

## 2011-08-04 DIAGNOSIS — E871 Hypo-osmolality and hyponatremia: Secondary | ICD-10-CM | POA: Diagnosis not present

## 2011-08-04 DIAGNOSIS — I10 Essential (primary) hypertension: Secondary | ICD-10-CM

## 2011-08-04 NOTE — Patient Instructions (Addendum)
The patient is instructed to continue all medications as prescribed. Schedule followup with check out clerk upon leaving the clinic     Latest dosing instructions   Total Sun Mon Tue Wed Thu Fri Sat   42.5 5 mg 5 mg 7.5 mg 5 mg 7.5 mg 5 mg 7.5 mg    (5 mg1) (5 mg1) (5 mg1.5) (5 mg1) (5 mg1.5) (5 mg1) (5 mg1.5)

## 2011-08-04 NOTE — Progress Notes (Signed)
Addended by: Rita Ohara R on: 08/04/2011 10:32 AM   Modules accepted: Orders

## 2011-08-04 NOTE — Progress Notes (Signed)
  Subjective:    Patient ID: Travis [name], male    DOB: [birth year], 71 y.o.   MRN: [id number]  HPI    Review of Systems     Objective:   Physical Exam        Assessment & Plan:

## 2011-08-04 NOTE — Progress Notes (Signed)
Subjective:    Patient ID: Travis [name], male    DOB: Aug 11, 1940, 71 y.o.   MRN: [id number]  HPI Has been released from lap hernia repair patient is followed for hypertension with a history of intermittent hyponatremia and hyperkalemia.  He is also followed for atrial fibrillation.  He is status post recent inguinal hernia repair he had laparoscopic surgery with mesh placement and is tolerated it well he has been released to normal exercise at this point.  Blood pressures extremely stable he denies chest pain shortness of breath any orthopnea.  He is here for routine monitoring    Review of Systems  Constitutional: Negative for fever and fatigue.  HENT: Negative for hearing loss, congestion, neck pain and postnasal drip.   Eyes: Negative for discharge, redness and visual disturbance.  Respiratory: Negative for cough, shortness of breath and wheezing.   Cardiovascular: Negative for leg swelling.  Gastrointestinal: Negative for abdominal pain, constipation and abdominal distention.  Genitourinary: Negative for urgency and frequency.  Musculoskeletal: Negative for joint swelling and arthralgias.  Skin: Negative for color change and rash.  Neurological: Negative for weakness and light-headedness.  Hematological: Negative for adenopathy.  Psychiatric/Behavioral: Negative for behavioral problems.   Past Medical History  Diagnosis Date  . Arthritis   . Hypertension   . GERD (gastroesophageal reflux disease)   . Cardiomyopathy secondary     Tachycardia-induced cardiomyopathy. Echo April 2009 EF 20-25%. June 2009--55%  . 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 Sinus bradycardia   . Paroxysmal atrial fibrillation     On Rythmol  . Arrhythmia   . Cardiomyopathy     tachycardia-induced, EF 55% June 2009  . Paroxysmal a-fib     sees Dr. Graciela Husbands    History   Social History  . Marital Status: Single    Spouse Name: N/A    Number of Children: 0  . Years of Education:  N/A   Occupational History  . Retired    Social History Main Topics  . Smoking status: Former Smoker -- 1.5 packs/day    Quit date: 05/26/1986  . Smokeless tobacco: Never Used  . Alcohol Use: 4.2 oz/week    7 Glasses of wine per week     2 daily   . Drug Use: No  . Sexually Active: Yes   Other Topics Concern  . Not on file   Social History Narrative   0 caffeine drinks daily     Past Surgical History  Procedure Date  . Cerebral anuersym post clips   . Rotator cuff repair     lf  . Fractured left arm   . Tonsillectomy   . Left tendon repair     lft foot  . Hernia repair     lft  . Wrist ganglion excision     lft  . Tonsillectomy   . Cardiac catheterization   . Inguinal hernia repair 07/02/2011    Procedure: LAPAROSCOPIC INGUINAL HERNIA;  Surgeon: Shelly Rubenstein, MD;  Location: [facility] OR;  Service: General;  Laterality: Left;  Laparoscopic left inguinal hernia repair and mesh    Family History  Problem Relation Age of Onset  . Stroke Father   . Lung cancer Mother 25  . Stroke Mother   . Colon cancer Neg Hx     No Known Allergies  Current Outpatient Prescriptions on File Prior to Visit  Medication Sig Dispense Refill  . ALPRAZolam (XANAX) 0.5 MG tablet Take 0.5 mg by mouth every 6 (  six) hours as needed. For anxiety      . amLODipine (NORVASC) 5 MG tablet Take 5 mg by mouth daily.      Marland Kitchen amLODipine (NORVASC) 5 MG tablet TAKE 1 TABLET (5 MG TOTAL) BY MOUTH DAILY.  30 tablet  5  . b complex vitamins tablet Take 1 tablet by mouth daily.       Marland Kitchen doxepin (SINEQUAN) 10 MG capsule Take 10 mg by mouth daily.      . finasteride (PROSCAR) 5 MG tablet Take 1.25 mg by mouth daily. Patient quarters 5mg  tablet.      . fish oil-omega-3 fatty acids 1000 MG capsule Take 1 g by mouth daily.       . Flaxseed, Linseed, (RA FLAX SEED OIL 1000 PO) Take 1,000 mg by mouth 3 (three) times daily.       . furosemide (LASIX) 20 MG tablet Take 20 mg by mouth daily.      . Glucosamine  Sulfate (GLUCOSAMINE RELIEF) 1000 MG TABS Take 1 tablet by mouth 2 (two) times daily.       . meloxicam (MOBIC) 15 MG tablet Take 15 mg by mouth daily.      . meloxicam (MOBIC) 15 MG tablet TAKE 1 TABLET EVERY DAY  30 tablet  5  . Milk Thistle 500 MG CAPS Take 1 capsule by mouth 2 (two) times daily.       . niacinamide 500 MG tablet Take 500 mg by mouth 2 (two) times daily.       Marland Kitchen omeprazole (PRILOSEC) 20 MG capsule Take 20 mg by mouth daily.      . propafenone (RYTHMOL) 225 MG tablet Take 337.5 mg by mouth 2 (two) times daily.       Marland Kitchen telmisartan (MICARDIS) 80 MG tablet Take 80 mg by mouth daily.      Marland Kitchen warfarin (COUMADIN) 5 MG tablet Take 5-7.5 mg by mouth daily. Take 5mg  daily, except take 7.5mg  on Tues, Thurs, and Sat.        BP 120/80  Pulse 72  Temp 98.3 F (36.8 C)  Resp 16  Ht 5\' 8"  (1.727 m)  Wt 186 lb (84.369 kg)  BMI 28.28 kg/m2       Objective:   Physical Exam  Nursing note and vitals reviewed. Constitutional: He appears well-developed and well-nourished.  HENT:  Head: Normocephalic and atraumatic.  Eyes: Conjunctivae are normal. Pupils are equal, round, and reactive to light.  Neck: Normal range of motion. Neck supple.  Cardiovascular: Normal rate and regular rhythm.   Pulmonary/Chest: Effort normal and breath sounds normal.  Abdominal: Soft. Bowel sounds are normal.          Assessment & Plan:  Heart rate stable ,sodium metabolism his sodium level was 129 in February at the time of surgery so we will not have to monitor today.  Blood pressure stable his current medications if ablation is stable.  He will require pro time today for his Coumadin therapy.

## 2011-08-15 ENCOUNTER — Other Ambulatory Visit: Payer: Self-pay | Admitting: Cardiology

## 2011-08-15 DIAGNOSIS — I6529 Occlusion and stenosis of unspecified carotid artery: Secondary | ICD-10-CM

## 2011-08-18 ENCOUNTER — Encounter (INDEPENDENT_AMBULATORY_CARE_PROVIDER_SITE_OTHER): Payer: Medicare Other

## 2011-08-18 DIAGNOSIS — I6529 Occlusion and stenosis of unspecified carotid artery: Secondary | ICD-10-CM

## 2011-08-26 DIAGNOSIS — D4819 Other specified neoplasm of uncertain behavior of connective and other soft tissue: Secondary | ICD-10-CM | POA: Diagnosis not present

## 2011-08-26 DIAGNOSIS — D481 Neoplasm of uncertain behavior of connective and other soft tissue: Secondary | ICD-10-CM | POA: Diagnosis not present

## 2011-08-26 DIAGNOSIS — M6740 Ganglion, unspecified site: Secondary | ICD-10-CM | POA: Diagnosis not present

## 2011-09-04 ENCOUNTER — Ambulatory Visit (INDEPENDENT_AMBULATORY_CARE_PROVIDER_SITE_OTHER): Payer: Medicare Other | Admitting: Internal Medicine

## 2011-09-04 DIAGNOSIS — I4891 Unspecified atrial fibrillation: Secondary | ICD-10-CM

## 2011-09-04 NOTE — Patient Instructions (Signed)
  Latest dosing instructions   Total Sun Mon Tue Wed Thu Fri Sat   42.5 5 mg 5 mg 7.5 mg 5 mg 7.5 mg 5 mg 7.5 mg    (5 mg1) (5 mg1) (5 mg1.5) (5 mg1) (5 mg1.5) (5 mg1) (5 mg1.5)       

## 2011-10-01 ENCOUNTER — Ambulatory Visit: Payer: Medicare Other

## 2011-10-05 ENCOUNTER — Other Ambulatory Visit: Payer: Self-pay | Admitting: Internal Medicine

## 2011-10-07 ENCOUNTER — Ambulatory Visit (INDEPENDENT_AMBULATORY_CARE_PROVIDER_SITE_OTHER): Payer: Medicare Other | Admitting: Internal Medicine

## 2011-10-07 DIAGNOSIS — T50904A Poisoning by unspecified drugs, medicaments and biological substances, undetermined, initial encounter: Secondary | ICD-10-CM

## 2011-10-07 DIAGNOSIS — T50901A Poisoning by unspecified drugs, medicaments and biological substances, accidental (unintentional), initial encounter: Secondary | ICD-10-CM | POA: Diagnosis not present

## 2011-10-07 DIAGNOSIS — I4891 Unspecified atrial fibrillation: Secondary | ICD-10-CM | POA: Diagnosis not present

## 2011-10-07 LAB — POCT INR: INR: 2

## 2011-10-07 NOTE — Patient Instructions (Signed)
  Latest dosing instructions   Total Sun Mon Tue Wed Thu Fri Sat   42.5 5 mg 5 mg 7.5 mg 5 mg 7.5 mg 5 mg 7.5 mg    (5 mg1) (5 mg1) (5 mg1.5) (5 mg1) (5 mg1.5) (5 mg1) (5 mg1.5)       

## 2011-10-20 ENCOUNTER — Other Ambulatory Visit: Payer: Self-pay | Admitting: Internal Medicine

## 2011-11-07 ENCOUNTER — Ambulatory Visit (INDEPENDENT_AMBULATORY_CARE_PROVIDER_SITE_OTHER): Payer: Medicare Other | Admitting: Family

## 2011-11-07 DIAGNOSIS — I4891 Unspecified atrial fibrillation: Secondary | ICD-10-CM | POA: Diagnosis not present

## 2011-11-07 LAB — POCT INR: INR: 2.8

## 2011-11-07 NOTE — Patient Instructions (Addendum)
  Latest dosing instructions   Total Sun Mon Tue Wed Thu Fri Sat   42.5 5 mg 5 mg 7.5 mg 5 mg 7.5 mg 5 mg 7.5 mg    (5 mg1) (5 mg1) (5 mg1.5) (5 mg1) (5 mg1.5) (5 mg1) (5 mg1.5)        5mg  a day except 7.5mg  Tues, Thurs, and Sat Check in 4 weeks

## 2011-11-30 ENCOUNTER — Other Ambulatory Visit: Payer: Self-pay | Admitting: Internal Medicine

## 2011-12-05 ENCOUNTER — Ambulatory Visit (INDEPENDENT_AMBULATORY_CARE_PROVIDER_SITE_OTHER): Payer: Medicare Other | Admitting: Family

## 2011-12-05 DIAGNOSIS — I4891 Unspecified atrial fibrillation: Secondary | ICD-10-CM | POA: Diagnosis not present

## 2011-12-05 NOTE — Patient Instructions (Addendum)
Hold dose today. Then continue 5mg  a day except 7.5mg  Tues, Thurs, and Sat Check in 3 weeks    Latest dosing instructions   Total Sun Mon Tue Wed Thu Fri Sat   42.5 5 mg 5 mg 7.5 mg 5 mg 7.5 mg 5 mg 7.5 mg    (5 mg1) (5 mg1) (5 mg1.5) (5 mg1) (5 mg1.5) (5 mg1) (5 mg1.5)

## 2011-12-08 ENCOUNTER — Ambulatory Visit (INDEPENDENT_AMBULATORY_CARE_PROVIDER_SITE_OTHER): Payer: Medicare Other | Admitting: Internal Medicine

## 2011-12-08 ENCOUNTER — Encounter: Payer: Self-pay | Admitting: Internal Medicine

## 2011-12-08 ENCOUNTER — Encounter (HOSPITAL_COMMUNITY): Payer: Self-pay | Admitting: Pharmacy Technician

## 2011-12-08 ENCOUNTER — Encounter: Payer: Self-pay | Admitting: *Deleted

## 2011-12-08 VITALS — BP 115/86 | HR 106 | Ht 68.0 in | Wt 187.0 lb

## 2011-12-08 DIAGNOSIS — I428 Other cardiomyopathies: Secondary | ICD-10-CM | POA: Diagnosis not present

## 2011-12-08 DIAGNOSIS — I4891 Unspecified atrial fibrillation: Secondary | ICD-10-CM

## 2011-12-08 DIAGNOSIS — I420 Dilated cardiomyopathy: Secondary | ICD-10-CM

## 2011-12-08 NOTE — Assessment & Plan Note (Signed)
The patient has recurrent atrial fibrillation with a rapid ventricular response particularly concerning in him because of his history of tachycardia-induced cardiomyopathy and the lack of symptoms and so the duration is unknown. We'll undertake an echo today to look at left ventricular function and undertaken cardioversion later this week.  We have reviewed again how to take his pulse is a that he can begin to do this on a daily basis so as to know when he may review reverted to atrial fibrillation

## 2011-12-08 NOTE — Progress Notes (Signed)
HPI  Travis [name] is a 71 y.o. male w history of paroxysmal atrial fibrillation and tachycardia-induced cardiomyopathySeen in followup today. He has been maintained on Rythmol  The patient denies chest pain, shortness of breath, nocturnal dyspnea, orthopnea or peripheral edema.  There have been no palpitations, lightheadedness or syncope.      Past Medical History  Diagnosis Date  . Arthritis   . Hypertension   . GERD (gastroesophageal reflux disease)   . Cardiomyopathy secondary     Tachycardia-induced cardiomyopathy. Echo April 2009 EF 20-25%. June 2009--55%  . 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 Sinus bradycardia   . Paroxysmal atrial fibrillation     On Rythmol  . Arrhythmia   . Cardiomyopathy     tachycardia-induced, EF 55% June 2009  . Paroxysmal a-fib     sees Dr. Graciela Husbands    Past Surgical History  Procedure Date  . Cerebral anuersym post clips   . Rotator cuff repair     lf  . Fractured left arm   . Tonsillectomy   . Left tendon repair     lft foot  . Hernia repair     lft  . Wrist ganglion excision     lft  . Tonsillectomy   . Cardiac catheterization   . Inguinal hernia repair 07/02/2011    Procedure: LAPAROSCOPIC INGUINAL HERNIA;  Surgeon: Shelly Rubenstein, MD;  Location: [facility] OR;  Service: General;  Laterality: Left;  Laparoscopic left inguinal hernia repair and mesh    Current Outpatient Prescriptions  Medication Sig Dispense Refill  . ALPRAZolam (XANAX) 0.5 MG tablet Take 0.5 mg by mouth every 6 (six) hours as needed. For anxiety      . amLODipine (NORVASC) 5 MG tablet Take 5 mg by mouth daily.      Marland Kitchen amLODipine (NORVASC) 5 MG tablet TAKE 1 TABLET (5 MG TOTAL) BY MOUTH DAILY.  30 tablet  5  . b complex vitamins tablet Take 1 tablet by mouth daily.       Marland Kitchen doxepin (SINEQUAN) 10 MG capsule TAKE ONE CAPSULE BY MOUTH AT BEDTIME  30 capsule  5  . finasteride (PROSCAR) 5 MG tablet Take 1.25 mg by mouth daily. Patient quarters 5mg  tablet.      .  fish oil-omega-3 fatty acids 1000 MG capsule Take 1 g by mouth daily.       . Flaxseed, Linseed, (RA FLAX SEED OIL 1000 PO) Take 1,000 mg by mouth 3 (three) times daily.       . furosemide (LASIX) 20 MG tablet Take 20 mg by mouth daily.      . furosemide (LASIX) 20 MG tablet TAKE 1 TABLET EVERY DAY  30 tablet  9  . Glucosamine Sulfate (GLUCOSAMINE RELIEF) 1000 MG TABS Take 1 tablet by mouth 2 (two) times daily.       . meloxicam (MOBIC) 15 MG tablet Take 15 mg by mouth daily.      . meloxicam (MOBIC) 15 MG tablet TAKE 1 TABLET EVERY DAY  30 tablet  5  . MICARDIS 80 MG tablet TAKE 1 TABLET EVERY DAY  30 tablet  9  . Milk Thistle 500 MG CAPS Take 1 capsule by mouth 2 (two) times daily.       . niacinamide 500 MG tablet Take 500 mg by mouth 2 (two) times daily.       Marland Kitchen omeprazole (PRILOSEC) 20 MG capsule Take 20 mg by mouth daily.      Marland Kitchen  propafenone (RYTHMOL) 225 MG tablet Take 337.5 mg by mouth 2 (two) times daily.       Marland Kitchen warfarin (COUMADIN) 5 MG tablet Take 5-7.5 mg by mouth daily. Take 5mg  daily, except take 7.5mg  on Tues, Thurs, and Sat.        No Known Allergies  Review of Systems negative except from HPI and PMH  Physical Exam Well developed and nourished in no acute distress HENT normal Neck supple with JVP-flat Carotids brisk and full without bruits Clear Irregularly irregular rate and rhythm with  /rapid ventricular response, no murmurs or gallops Abd-soft with active BS without hepatomegaly No Clubbing cyanosis edema Skin-warm and dry A & Oriented  Grossly normal sensory and motor function   ECG  Atrial fibrillation at 104 intervals-/0.17 left bundle branch block   Assessment and  Plan

## 2011-12-08 NOTE — Patient Instructions (Addendum)
Your physician has recommended that you have a Cardioversion (DCCV). Electrical Cardioversion uses a jolt of electricity to your heart either through paddles or wired patches attached to your chest. This is a controlled, usually prescheduled, procedure. Defibrillation is done under light anesthesia in the hospital, and you usually go home the day of the procedure. This is done to get your heart back into a normal rhythm. You are not awake for the procedure. Please see the instruction sheet given to you today.  Your physician has requested that you have an echocardiogram (preferrably prior to your cardioversion on 7/18). Echocardiography is a painless test that uses sound waves to create images of your heart. It provides your doctor with information about the size and shape of your heart and how well your heart's chambers and valves are working. This procedure takes approximately one hour. There are no restrictions for this procedure.  Your physician recommends that you continue on your current medications as directed. Please refer to the Current Medication list given to you today.  Your physician recommends that you schedule a follow-up appointment in: 2-3 weeks with the PA.

## 2011-12-09 ENCOUNTER — Telehealth: Payer: Self-pay | Admitting: Internal Medicine

## 2011-12-09 ENCOUNTER — Ambulatory Visit (HOSPITAL_COMMUNITY): Payer: Medicare Other | Attending: Cardiovascular Disease | Admitting: Radiology

## 2011-12-09 DIAGNOSIS — I059 Rheumatic mitral valve disease, unspecified: Secondary | ICD-10-CM | POA: Diagnosis not present

## 2011-12-09 DIAGNOSIS — I517 Cardiomegaly: Secondary | ICD-10-CM | POA: Diagnosis not present

## 2011-12-09 DIAGNOSIS — I428 Other cardiomyopathies: Secondary | ICD-10-CM | POA: Diagnosis not present

## 2011-12-09 DIAGNOSIS — I10 Essential (primary) hypertension: Secondary | ICD-10-CM | POA: Diagnosis not present

## 2011-12-09 DIAGNOSIS — I4891 Unspecified atrial fibrillation: Secondary | ICD-10-CM | POA: Diagnosis not present

## 2011-12-09 DIAGNOSIS — I420 Dilated cardiomyopathy: Secondary | ICD-10-CM

## 2011-12-09 NOTE — Telephone Encounter (Signed)
New msg Pt wants to talk to you about cardioversion appt. He said his heart is back in rhythm. Please call

## 2011-12-09 NOTE — Telephone Encounter (Signed)
I spoke with the patient. He thinks he went back in to NSR last night. He is scheduled for DCCV on 12/11/11 at 1:00 pm with Dr. Graciela Husbands. He will come in the morning for a nurse visit- EKG to verify he is in rhythm. If he is, we can cancel his DCCV. He is agreeable.

## 2011-12-09 NOTE — Progress Notes (Signed)
Echocardiogram performed.  

## 2011-12-10 ENCOUNTER — Ambulatory Visit (INDEPENDENT_AMBULATORY_CARE_PROVIDER_SITE_OTHER): Payer: Medicare Other | Admitting: *Deleted

## 2011-12-10 VITALS — BP 142/82 | HR 63 | Wt 185.0 lb

## 2011-12-10 DIAGNOSIS — I4891 Unspecified atrial fibrillation: Secondary | ICD-10-CM

## 2011-12-10 NOTE — Progress Notes (Signed)
Patient came into office today for an EKG to see if he was back in rhythm. Patient is scheduled for DCCV on 7/18.  EKG done and patient is sinus rhythm.  EKG reviewed by Dawayne Patricia NP and will cancel DCCV for tomorrow and have patient keep follow up appointment to confirm still in sinus rhythm.  Advised patient and he will call back if he feels he goes out of rhythm.

## 2011-12-11 ENCOUNTER — Ambulatory Visit (HOSPITAL_COMMUNITY): Admission: RE | Admit: 2011-12-11 | Payer: Medicare Other | Source: Ambulatory Visit | Admitting: Internal Medicine

## 2011-12-11 ENCOUNTER — Encounter (HOSPITAL_COMMUNITY): Admission: RE | Payer: Self-pay | Source: Ambulatory Visit

## 2011-12-11 ENCOUNTER — Other Ambulatory Visit: Payer: Self-pay | Admitting: Internal Medicine

## 2011-12-11 DIAGNOSIS — I4891 Unspecified atrial fibrillation: Secondary | ICD-10-CM

## 2011-12-11 SURGERY — CARDIOVERSION
Anesthesia: General

## 2011-12-19 ENCOUNTER — Telehealth: Payer: Self-pay | Admitting: Internal Medicine

## 2011-12-19 NOTE — Telephone Encounter (Signed)
New problem:  Patient calling - test results  

## 2011-12-19 NOTE — Telephone Encounter (Signed)
done

## 2011-12-25 ENCOUNTER — Ambulatory Visit (INDEPENDENT_AMBULATORY_CARE_PROVIDER_SITE_OTHER): Payer: Medicare Other | Admitting: Physician Assistant

## 2011-12-25 ENCOUNTER — Encounter: Payer: Self-pay | Admitting: Physician Assistant

## 2011-12-25 VITALS — BP 119/75 | HR 69 | Ht 68.0 in | Wt 185.0 lb

## 2011-12-25 DIAGNOSIS — I4891 Unspecified atrial fibrillation: Secondary | ICD-10-CM | POA: Diagnosis not present

## 2011-12-25 LAB — TSH: TSH: 1.32 u[IU]/mL (ref 0.35–5.50)

## 2011-12-25 NOTE — Patient Instructions (Addendum)
Your physician recommends that you schedule a follow-up appointment in: 6-8 WEEKS WITH DR. Graciela Husbands   Your physician recommends that you return for lab work in: TODAY TSH

## 2011-12-25 NOTE — Progress Notes (Signed)
[address] [place], [place]  [address] Phone: [phone] Fax:  [phone]  Date:  12/25/2011   Name:  Travis [name]   DOB:  1941-04-29   MRN:  [id number]  PCP:  Carrie Mew, MD  Primary Cardiologist/Primary Electrophysiologist:  Dr. Sherryl Manges    History of Present Illness: Travis [name] is a 71 y.o. male who returns for follow up.  He has a history of paroxysmal atrial fibrillation, tachycardia mediated cardiomyopathy, maintained on Rythmol therapy, HTN, and GERD. He is also status post clipping of a cerebral aneurysm. He saw Dr. Graciela Husbands 12/08/11 and was noted to be in recurrent atrial fibrillation with RVR. Cardioversion was arranged as well as an echocardiogram.   Echo 12/09/11:  EF 55% grade 2 diastolic dysfunction, functionally bicuspid aortic valve with fusion of left and noncoronary cusps, sclerosis without stenosis, trivial AI, mild MR, mild to moderate LAE, mild RAE, PASP 56-60 (moderate pulmonary hypertension).    He presented to the office on 12/12/11 because he felt he was back in sinus rhythm. Electrocardiogram did indeed demonstrate normal sinus rhythm. This appointment was already made for followup after his cardioversion.  He has done well. He notes a couple of episodes of atrial fibrillation in the last few days. These episodes last 15-20 minutes and he goes back to sinus rhythm on his own. He is in sinus rhythm today. He denies chest pain, shortness of breath, syncope, orthopnea, PND or edema.     TSH  Date/Time Value Range Status  02/03/2011 11:49 AM 1.19  0.35 - 5.50 uIU/mL Final    Past Medical History  Diagnosis Date  . Arthritis   . Hypertension   . GERD (gastroesophageal reflux disease)   . Cardiomyopathy secondary     Tachycardia-induced cardiomyopathy. Echo April 2009 EF 20-25%. June 2009--55%  . 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 Sinus bradycardia   . Paroxysmal atrial fibrillation     On Rythmol  .  Arrhythmia   . Cardiomyopathy     tachycardia-induced, EF 55% June 2009  . Paroxysmal a-fib     sees Dr. Graciela Husbands    Current Outpatient Prescriptions  Medication Sig Dispense Refill  . ALPRAZolam (XANAX) 0.5 MG tablet Take 0.5 mg by mouth every 6 (six) hours as needed. For anxiety      . amLODipine (NORVASC) 5 MG tablet Take 5 mg by mouth daily.      Marland Kitchen b complex vitamins tablet Take 1 tablet by mouth daily.       Marland Kitchen doxepin (SINEQUAN) 10 MG capsule Take 10 mg by mouth at bedtime.      . finasteride (PROPECIA) 1 MG tablet Take 1 mg by mouth daily.      . fish oil-omega-3 fatty acids 1000 MG capsule Take 1 g by mouth daily.       . Flaxseed, Linseed, (RA FLAX SEED OIL 1000 PO) Take 1,000 mg by mouth 3 (three) times daily.       . furosemide (LASIX) 20 MG tablet Take 20 mg by mouth daily.      . Glucosamine Sulfate (GLUCOSAMINE RELIEF) 1000 MG TABS Take 1 tablet by mouth 2 (two) times daily.       . meloxicam (MOBIC) 15 MG tablet Take 15 mg by mouth daily.      . Milk Thistle 500 MG CAPS Take 1 capsule by mouth 2 (two) times daily.       . niacinamide 500 MG tablet  Take 1,000 mg by mouth daily.       Marland Kitchen omeprazole (PRILOSEC) 20 MG capsule Take 20 mg by mouth daily.      . propafenone (RYTHMOL) 225 MG tablet Take 337.5 mg by mouth 2 (two) times daily.       Marland Kitchen telmisartan (MICARDIS) 80 MG tablet Take 80 mg by mouth daily.      Marland Kitchen warfarin (COUMADIN) 5 MG tablet Take 5-7.5 mg by mouth daily. Take 5mg  daily, except take 7.5mg  on Tues, Thurs, and Sat.        Allergies: No Known Allergies  History  Substance Use Topics  . Smoking status: Former Smoker -- 1.5 packs/day    Quit date: 05/26/1986  . Smokeless tobacco: Never Used  . Alcohol Use: 4.2 oz/week    7 Glasses of wine per week     2 daily      PHYSICAL EXAM: VS:  BP 119/75  Pulse 69  Ht 5\' 8"  (1.727 m)  Wt 185 lb (83.915 kg)  BMI 28.13 kg/m2 Well nourished, well developed, in no acute distress HEENT: normal Neck: no  JVD Cardiac:  normal S1, S2; RRR; no murmur Lungs:  clear to auscultation bilaterally, no wheezing, rhonchi or rales Abd: soft, nontender, no hepatomegaly Ext: no edema Skin: warm and dry Neuro:  CNs 2-12 intact, no focal abnormalities noted  EKG:  Sinus rhythm, heart rate 65, left bundle branch block      ASSESSMENT AND PLAN:  1. Atrial fibrillation He is maintaining sinus rhythm. He is on Coumadin. He has had a couple of episodes of very brief paroxysmal atrial fibrillation since last week. He has not been symptomatic with this. We discussed whether or not adjusting his dose of Propafenone would be appropriate. However, I would leave this up to Dr. Graciela Husbands. He has not had a TSH drawn in the last year. I will check that today. His recent echo shows normal LV function. Follow up with Dr. Graciela Husbands in 6-8 weeks.  Signed, Tereso Newcomer, PA-C  8:28 AM 12/25/2011

## 2011-12-26 ENCOUNTER — Ambulatory Visit (INDEPENDENT_AMBULATORY_CARE_PROVIDER_SITE_OTHER): Payer: Medicare Other | Admitting: Family

## 2011-12-26 DIAGNOSIS — I4891 Unspecified atrial fibrillation: Secondary | ICD-10-CM

## 2011-12-26 LAB — POCT INR: INR: 3.7

## 2011-12-26 NOTE — Patient Instructions (Addendum)
Hold dose today. Then continue 5mg  (1 tab) a day except Wed 7.5mg  (1.5 tab). Check in 3 weeks    Latest dosing instructions   Total Sun Mon Tue Wed Thu Fri Sat   37.5 5 mg 5 mg 5 mg 7.5 mg 5 mg 5 mg 5 mg    (5 mg1) (5 mg1) (5 mg1) (5 mg1.5) (5 mg1) (5 mg1) (5 mg1)

## 2011-12-30 ENCOUNTER — Other Ambulatory Visit: Payer: Self-pay | Admitting: Internal Medicine

## 2011-12-31 ENCOUNTER — Telehealth: Payer: Self-pay | Admitting: Internal Medicine

## 2011-12-31 NOTE — Telephone Encounter (Signed)
Please return call to patient [phone] regarding possible heart out of rhythm.

## 2011-12-31 NOTE — Telephone Encounter (Signed)
Reviewed with Lilian Coma and pt should come in for EKG and coumadin clinic visit today. I called pt to give him this information. Left message to call back.

## 2011-12-31 NOTE — Telephone Encounter (Signed)
Spoke with pt who reports he was scheduled for cardioversion but it was cancelled because he went back to sinus rhythm.  He reports this Sunday (December 28, 2011) that his heart went out of rhythm. He states he can tell when it is irregular. He states it usually lasts for about 3 hours but has continued since Sunday.  Does not know actual heart rate but it is a little faster than normal. Blood pressure good per pt report. He notes increased shortness of breath with activity such as climbing stairs when heart beat irregular.  Shortness of breath goes away with rest.  He is going out of town next week and is asking about possibility of cardioversion this week.  Will review with Tereso Newcomer, PA who saw pt last week

## 2011-12-31 NOTE — Telephone Encounter (Signed)
Spoke with pt. He is unable to get here by 5:00 today for EKG. He will come in tomorrow at 10:00 for EKG and Coumadin clinic visit. I told pt EKG would be evaluated at that time and decision made regarding possible cardioversion at that time

## 2012-01-01 ENCOUNTER — Encounter: Payer: Self-pay | Admitting: Physician Assistant

## 2012-01-01 ENCOUNTER — Ambulatory Visit (INDEPENDENT_AMBULATORY_CARE_PROVIDER_SITE_OTHER): Payer: Medicare Other | Admitting: Physician Assistant

## 2012-01-01 ENCOUNTER — Ambulatory Visit (INDEPENDENT_AMBULATORY_CARE_PROVIDER_SITE_OTHER): Payer: Medicare Other | Admitting: *Deleted

## 2012-01-01 VITALS — BP 124/84 | HR 108 | Ht 68.0 in | Wt 186.8 lb

## 2012-01-01 DIAGNOSIS — I4891 Unspecified atrial fibrillation: Secondary | ICD-10-CM | POA: Diagnosis not present

## 2012-01-01 MED ORDER — METOPROLOL TARTRATE 25 MG PO TABS: 25.0000 mg | ORAL_TABLET | Freq: Two times a day (BID) | ORAL | Status: DC

## 2012-01-01 NOTE — Progress Notes (Signed)
[address] [place], [place]  [address] Phone: [phone] Fax:  [phone]  Date:  01/01/2012   Name:  Travis [name]   DOB:  01-Feb-1941   MRN:  [id number]  PCP:  Carrie Mew, MD  Primary Cardiologist/Primary Electrophysiologist:  Dr. Sherryl Manges    History of Present Illness: Travis [name] is a 71 y.o. male who returns for follow up.  He has a history of paroxysmal atrial fibrillation, tachycardia mediated cardiomyopathy, maintained on Rythmol therapy, HTN, and GERD. He is also status post clipping of a cerebral aneurysm. He saw Dr. Graciela Husbands 12/08/11 and was noted to be in recurrent atrial fibrillation with RVR.  Echo 12/09/11:  EF 55% grade 2 diastolic dysfunction, functionally bicuspid aortic valve with fusion of left and noncoronary cusps, sclerosis without stenosis, trivial AI, mild MR, mild to moderate LAE, mild RAE, PASP 56-60 (moderate pulmonary hypertension).  However, he converted to sinus rhythm on his own. I saw him in followup and we continued her current medical therapy. He was to see Dr. Graciela Husbands in October. He called in yesterday feeling like he was back in atrial fibrillation. Overall, he feels well. He feels a little more short of breath with exertion. He feels like his heart is a regular. Otherwise, he denies chest pain, syncope, near syncope, orthopnea, PND or edema.   Past Medical History  Diagnosis Date  . Arthritis   . Hypertension   . GERD (gastroesophageal reflux disease)   . Cardiomyopathy secondary     Tachycardia-induced cardiomyopathy. Echo April 2009 EF 20-25%. June 2009--55%  . 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 Sinus bradycardia   . Paroxysmal atrial fibrillation     On Rythmol  . Arrhythmia   . Cardiomyopathy     tachycardia-induced, EF 55% June 2009  . Paroxysmal a-fib     sees Dr. Graciela Husbands    Current Outpatient Prescriptions  Medication Sig Dispense Refill  . ALPRAZolam (XANAX) 0.5 MG tablet Take 0.5 mg  by mouth every 6 (six) hours as needed. For anxiety      . amLODipine (NORVASC) 5 MG tablet Take 5 mg by mouth daily.      Marland Kitchen b complex vitamins tablet Take 1 tablet by mouth daily.       Marland Kitchen doxepin (SINEQUAN) 10 MG capsule Take 10 mg by mouth at bedtime.      . finasteride (PROPECIA) 1 MG tablet Take 1 mg by mouth daily.      . fish oil-omega-3 fatty acids 1000 MG capsule Take 1 g by mouth daily.       . Flaxseed, Linseed, (RA FLAX SEED OIL 1000 PO) Take 1,000 mg by mouth 3 (three) times daily.       . furosemide (LASIX) 20 MG tablet Take 20 mg by mouth daily.      . Glucosamine Sulfate (GLUCOSAMINE RELIEF) 1000 MG TABS Take 1 tablet by mouth 2 (two) times daily.       . meloxicam (MOBIC) 15 MG tablet Take 15 mg by mouth daily.      . Milk Thistle 500 MG CAPS Take 1 capsule by mouth 2 (two) times daily.       . niacinamide 500 MG tablet Take 1,000 mg by mouth daily.       Marland Kitchen omeprazole (PRILOSEC) 20 MG capsule Take 20 mg by mouth daily.      . propafenone (RYTHMOL) 225 MG tablet Take 337.5 mg by mouth 2 (two) times  daily.       . telmisartan (MICARDIS) 80 MG tablet Take 80 mg by mouth daily.      Marland Kitchen warfarin (COUMADIN) 5 MG tablet Take 5-7.5 mg by mouth daily. Take 5mg  daily, except take 7.5mg  on Tues, Thurs, and Sat.      . metoprolol tartrate (LOPRESSOR) 25 MG tablet Take 1 tablet (25 mg total) by mouth 2 (two) times daily.  60 tablet  11    Allergies: No Known Allergies  History  Substance Use Topics  . Smoking status: Former Smoker -- 1.5 packs/day    Quit date: 05/26/1986  . Smokeless tobacco: Never Used  . Alcohol Use: 4.2 oz/week    7 Glasses of wine per week     2 daily      PHYSICAL EXAM: VS:  BP 124/84  Pulse 108  Ht 5\' 8"  (1.727 m)  Wt 186 lb 12.8 oz (84.732 kg)  BMI 28.40 kg/m2 Well nourished, well developed, in no acute distress HEENT: normal Neck: no JVD Cardiac:  normal S1, S2; RRR; no murmur Lungs:  clear to auscultation bilaterally, no wheezing, rhonchi or  rales Abd: soft, nontender, no hepatomegaly Ext: no edema Skin: warm and dry Neuro:  CNs 2-12 intact, no focal abnormalities noted  EKG:  Atrial fibrillation, heart rate 108, left bundle branch block      ASSESSMENT AND PLAN:  1. Atrial Fibrillation He has gone back into atrial fibrillation with RVR.  I discussed this with Dr. Myrtis Ser (DOD). The patient's INR today is 2.7. At this point, it does not seem that cardioversion would be all that helpful. He has shown recent evidence of going in and out of AFib on his own.  Overall, it seems as though he may be failing Rythmol therapy. He is getting ready to down for a week ([place]). He is not that symptomatic. I will place him on metoprolol 25 mg bid for rate control.  I will bring him back to see Dr. Sherryl Manges in the next 3 weeks for further recommendations.  Signed, Tereso Newcomer, PA-C  1:36 PM 01/01/2012

## 2012-01-01 NOTE — Patient Instructions (Addendum)
Your physician recommends that you schedule a follow-up appointment in: 01/23/12 @ 12:15 WITH DR. Graciela Husbands OK PER Sherri Rad, RN  Your physician has recommended you make the following change in your medication: START METOPROLOL TARTRATE 25 MG 1 TABLET TWICE DAILY

## 2012-01-11 DIAGNOSIS — R42 Dizziness and giddiness: Secondary | ICD-10-CM | POA: Diagnosis not present

## 2012-01-11 DIAGNOSIS — R0989 Other specified symptoms and signs involving the circulatory and respiratory systems: Secondary | ICD-10-CM | POA: Diagnosis not present

## 2012-01-11 DIAGNOSIS — R0609 Other forms of dyspnea: Secondary | ICD-10-CM | POA: Diagnosis not present

## 2012-01-11 DIAGNOSIS — J90 Pleural effusion, not elsewhere classified: Secondary | ICD-10-CM | POA: Diagnosis not present

## 2012-01-11 DIAGNOSIS — R069 Unspecified abnormalities of breathing: Secondary | ICD-10-CM | POA: Diagnosis not present

## 2012-01-11 DIAGNOSIS — J159 Unspecified bacterial pneumonia: Secondary | ICD-10-CM | POA: Diagnosis not present

## 2012-01-15 ENCOUNTER — Other Ambulatory Visit: Payer: Self-pay | Admitting: Internal Medicine

## 2012-01-19 ENCOUNTER — Ambulatory Visit (INDEPENDENT_AMBULATORY_CARE_PROVIDER_SITE_OTHER): Payer: Medicare Other | Admitting: Family

## 2012-01-19 DIAGNOSIS — I4891 Unspecified atrial fibrillation: Secondary | ICD-10-CM

## 2012-01-19 LAB — POCT INR: INR: 5.4

## 2012-01-19 NOTE — Patient Instructions (Signed)
Hold Coumadin Monday, Tuesday, and Wednesday. Eat more greens. Then resume Thursday. Continue 5mg  (1 tab) a day except Wed 7.5mg  (1.5 tab).  Recheck in 2 weeks.    Latest dosing instructions   Total Sun Mon Tue Wed Thu Fri Sat   37.5 5 mg 5 mg 5 mg 7.5 mg 5 mg 5 mg 5 mg    (5 mg1) (5 mg1) (5 mg1) (5 mg1.5) (5 mg1) (5 mg1) (5 mg1)

## 2012-01-23 ENCOUNTER — Ambulatory Visit (INDEPENDENT_AMBULATORY_CARE_PROVIDER_SITE_OTHER): Payer: Medicare Other | Admitting: Internal Medicine

## 2012-01-23 ENCOUNTER — Encounter: Payer: Self-pay | Admitting: Internal Medicine

## 2012-01-23 VITALS — BP 141/86 | HR 47 | Ht 68.0 in | Wt 186.0 lb

## 2012-01-23 DIAGNOSIS — I4891 Unspecified atrial fibrillation: Secondary | ICD-10-CM | POA: Diagnosis not present

## 2012-01-23 DIAGNOSIS — I480 Paroxysmal atrial fibrillation: Secondary | ICD-10-CM | POA: Insufficient documentation

## 2012-01-23 DIAGNOSIS — I428 Other cardiomyopathies: Secondary | ICD-10-CM | POA: Diagnosis not present

## 2012-01-23 DIAGNOSIS — I420 Dilated cardiomyopathy: Secondary | ICD-10-CM

## 2012-01-23 DIAGNOSIS — Q2381 Bicuspid aortic valve: Secondary | ICD-10-CM

## 2012-01-23 DIAGNOSIS — I10 Essential (primary) hypertension: Secondary | ICD-10-CM

## 2012-01-23 DIAGNOSIS — Q231 Congenital insufficiency of aortic valve: Secondary | ICD-10-CM | POA: Diagnosis not present

## 2012-01-23 NOTE — Patient Instructions (Signed)
Your physician has recommended you make the following change in your medication:  1) Stop metoprolol.  Your physician wants you to follow-up in: 6 months with Dr. Graciela Husbands. You will receive a reminder letter in the mail two months in advance. If you don't receive a letter, please call our office to schedule the follow-up appointment.

## 2012-01-23 NOTE — Assessment & Plan Note (Signed)
Continue current medications. 

## 2012-01-23 NOTE — Assessment & Plan Note (Signed)
We'll continue on current medications. He will use his metoprolol on an as-needed basis for rate control when he goes out of rhythm.

## 2012-01-23 NOTE — Progress Notes (Signed)
Patient Care Team: Stacie Glaze, MD as PCP - General   HPI  Travis [name] is a 71 y.o. male history of paroxysmal atrial fibrillation and tachycardia-induced cardiomyopathy seen in followup today. He has been maintained on Rythmol and has had infrequent recurrences  Most recent echo 7/13 >>EF 55% w  A now recognized bicuspid aortic valve  The patient denies chest pain, shortness of breath, nocturnal dyspnea, orthopnea or peripheral edema. There have been no palpitations, lightheadedness or syncope.   He has an episode recently that lasts a couple of days. It reverted on its own.   Past Medical History  Diagnosis Date  . Arthritis   . Hypertension   . GERD (gastroesophageal reflux disease)   . Bicuspid aortic valve   . 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 Sinus bradycardia   . Paroxysmal atrial fibrillation     On Rythmol  . Cardiomyopathy -resolved     tachycardia-induced, EF 55% June 2009    Past Surgical History  Procedure Date  . Cerebral anuersym post clips   . Rotator cuff repair     lf  . Fractured left arm   . Tonsillectomy   . Left tendon repair     lft foot  . Hernia repair     lft  . Wrist ganglion excision     lft  . Tonsillectomy   . Cardiac catheterization   . Inguinal hernia repair 07/02/2011    Procedure: LAPAROSCOPIC INGUINAL HERNIA;  Surgeon: Shelly Rubenstein, MD;  Location: [facility] OR;  Service: General;  Laterality: Left;  Laparoscopic left inguinal hernia repair and mesh    Current Outpatient Prescriptions  Medication Sig Dispense Refill  . ALPRAZolam (XANAX) 0.5 MG tablet Take 0.5 mg by mouth every 6 (six) hours as needed. For anxiety      . amLODipine (NORVASC) 5 MG tablet Take 5 mg by mouth daily.      Marland Kitchen b complex vitamins tablet Take 1 tablet by mouth daily.       Marland Kitchen doxepin (SINEQUAN) 10 MG capsule Take 10 mg by mouth at bedtime.      . finasteride (PROPECIA) 1 MG tablet Take 1 mg by mouth daily.      . fish oil-omega-3 fatty acids  1000 MG capsule Take 1 g by mouth daily.       . Flaxseed, Linseed, (RA FLAX SEED OIL 1000 PO) Take 1,000 mg by mouth 3 (three) times daily.       . furosemide (LASIX) 20 MG tablet Take 20 mg by mouth daily.      . Glucosamine Sulfate (GLUCOSAMINE RELIEF) 1000 MG TABS Take 1 tablet by mouth 2 (two) times daily.       . meloxicam (MOBIC) 15 MG tablet Take 15 mg by mouth daily.      . metoprolol tartrate (LOPRESSOR) 25 MG tablet Take 1 tablet (25 mg total) by mouth 2 (two) times daily.  60 tablet  11  . Milk Thistle 500 MG CAPS Take 1 capsule by mouth 2 (two) times daily.       . niacinamide 500 MG tablet Take 1,000 mg by mouth daily.       Marland Kitchen omeprazole (PRILOSEC) 20 MG capsule Take 20 mg by mouth daily.      . propafenone (RYTHMOL) 225 MG tablet Take 337.5 mg by mouth 2 (two) times daily.       Marland Kitchen telmisartan (MICARDIS) 80 MG tablet Take 80 mg by mouth  daily.      . warfarin (COUMADIN) 5 MG tablet Take 5-7.5 mg by mouth daily. Take 5mg  daily, except take 7.5mg  on Tues, Thurs, and Sat.      . warfarin (COUMADIN) 5 MG tablet TAKE 1 TABLET EVERY DAY OR AS DIRECTED  60 tablet  5    No Known Allergies  Review of Systems negative except from HPI and PMH  Physical Exam BP 141/86  Pulse 47  Ht 5\' 8"  (1.727 m)  Wt 186 lb (84.369 kg)  BMI 28.28 kg/m2  SpO2 95% Well developed and well nourished in no acute distress HENT normal E scleral and icterus clear Neck Supple JVP flat; carotids brisk and full Clear to ausculatio *Regular rate and rhythm, no murmurs gallops or rub Soft with active bowel sounds No clubbing cyanosis none Edema Alert and oriented, grossly normal motor and sensory function Skin Warm and Dry    Assessment and  Plan

## 2012-01-23 NOTE — Assessment & Plan Note (Signed)
His blood pressure remains poorly controlled. I suggested to him that we consider the use of Aldactone as there is a subgroup of patients with resistant hypertension who are somewhat hyper aldosteronemic. He could be exchanged for his Lasix. I will defer this to his next visit in September with Dr. Lovell Sheehan. Getting his blood pressure for target is particularly important if in fact he does have a bicuspid aortic valve because of the association with aortopathy

## 2012-01-23 NOTE — Assessment & Plan Note (Signed)
I've asked Dr. Eden Emms to review the echo as the previously described aortic valve was trileaflet. We will also need to review it with a one occasion his aorta

## 2012-01-25 ENCOUNTER — Other Ambulatory Visit: Payer: Self-pay | Admitting: Internal Medicine

## 2012-01-27 ENCOUNTER — Other Ambulatory Visit: Payer: Self-pay | Admitting: Internal Medicine

## 2012-01-30 ENCOUNTER — Ambulatory Visit (INDEPENDENT_AMBULATORY_CARE_PROVIDER_SITE_OTHER): Payer: Medicare Other | Admitting: Family

## 2012-01-30 DIAGNOSIS — I447 Left bundle-branch block, unspecified: Secondary | ICD-10-CM | POA: Diagnosis not present

## 2012-01-30 DIAGNOSIS — I4891 Unspecified atrial fibrillation: Secondary | ICD-10-CM

## 2012-01-30 DIAGNOSIS — I428 Other cardiomyopathies: Secondary | ICD-10-CM

## 2012-01-30 DIAGNOSIS — I420 Dilated cardiomyopathy: Secondary | ICD-10-CM

## 2012-01-30 LAB — POCT INR: INR: 1.5

## 2012-01-30 NOTE — Patient Instructions (Addendum)
Today only, take 2 tabs. Then continue 5mg  a day except Wednesday 7.5mg . Recheck 3 weeks.    Latest dosing instructions   Total Sun Mon Tue Wed Thu Fri Sat   37.5 5 mg 5 mg 5 mg 7.5 mg 5 mg 5 mg 5 mg    (5 mg1) (5 mg1) (5 mg1) (5 mg1.5) (5 mg1) (5 mg1) (5 mg1)

## 2012-02-01 ENCOUNTER — Other Ambulatory Visit: Payer: Self-pay | Admitting: Gastroenterology

## 2012-02-04 ENCOUNTER — Encounter: Payer: Self-pay | Admitting: Internal Medicine

## 2012-02-04 ENCOUNTER — Ambulatory Visit (INDEPENDENT_AMBULATORY_CARE_PROVIDER_SITE_OTHER): Payer: Medicare Other | Admitting: Internal Medicine

## 2012-02-04 VITALS — BP 136/80 | HR 72 | Temp 98.2°F | Resp 16 | Ht 68.0 in | Wt 184.0 lb

## 2012-02-04 DIAGNOSIS — I4891 Unspecified atrial fibrillation: Secondary | ICD-10-CM

## 2012-02-04 DIAGNOSIS — I10 Essential (primary) hypertension: Secondary | ICD-10-CM | POA: Diagnosis not present

## 2012-02-04 DIAGNOSIS — I480 Paroxysmal atrial fibrillation: Secondary | ICD-10-CM

## 2012-02-04 DIAGNOSIS — Z23 Encounter for immunization: Secondary | ICD-10-CM

## 2012-02-04 DIAGNOSIS — E785 Hyperlipidemia, unspecified: Secondary | ICD-10-CM

## 2012-02-04 MED ORDER — SPIRONOLACTONE 50 MG PO TABS: 50.0000 mg | ORAL_TABLET | Freq: Every day | ORAL | Status: DC

## 2012-02-04 NOTE — Patient Instructions (Signed)
We are going to stop the furosemide 20 mg once a day and replace that with Aldactone 50 mg once a day.  These are similar diuretics or fluid pills but the Aldactone is a potassium sparing agent cardiologist believes that it also may have other hormonal effects that improve your ability to stay in sinus rhythm.

## 2012-02-04 NOTE — Progress Notes (Signed)
Subjective:    Patient ID: Travis [name], male    DOB: [birth year], 71 y.o.   MRN: [id number]  HPI Patient is a 71 year old male followed for hypertension atrial fibrillation and a suggestion by the cardiologist that his diuretic be changed to Aldactone from for a semi-.  This. Behind this change is that the Aldactone would be potassium sparing as well as that the androgen fracture of the Aldactone may be positive.  I am concerned about his history of hyperkalemia and we will need to monitor that carefully if his potassium climbs on the Aldactone we would have to either revert back to furosemide or we would use a combination of furosemide and Aldactone to control the potassium....    Review of Systems  Constitutional: Negative for fever and fatigue.  HENT: Negative for hearing loss, congestion, neck pain and postnasal drip.   Eyes: Negative for discharge, redness and visual disturbance.  Respiratory: Negative for cough, shortness of breath and wheezing.   Cardiovascular: Negative for leg swelling.  Gastrointestinal: Negative for abdominal pain, constipation and abdominal distention.  Genitourinary: Negative for urgency and frequency.  Musculoskeletal: Negative for joint swelling and arthralgias.  Skin: Negative for color change and rash.  Neurological: Negative for weakness and light-headedness.  Hematological: Negative for adenopathy.  Psychiatric/Behavioral: Negative for behavioral problems.   Past Medical History  Diagnosis Date  . Arthritis   . Hypertension   . GERD (gastroesophageal reflux disease)   . Bicuspid aortic valve   . 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 Sinus bradycardia   . Paroxysmal atrial fibrillation     On Rythmol  . Cardiomyopathy -resolved     tachycardia-induced, EF 55% June 2009    History   Social History  . Marital Status: Single    Spouse Name: N/A    Number of Children: 0  . Years of Education: N/A   Occupational History  .  Retired    Social History Main Topics  . Smoking status: Former Smoker -- 1.5 packs/day    Quit date: 05/26/1986  . Smokeless tobacco: Never Used  . Alcohol Use: 4.2 oz/week    7 Glasses of wine per week     2 daily   . Drug Use: No  . Sexually Active: Yes   Other Topics Concern  . Not on file   Social History Narrative   0 caffeine drinks daily     Past Surgical History  Procedure Date  . Cerebral anuersym post clips   . Rotator cuff repair     lf  . Fractured left arm   . Tonsillectomy   . Left tendon repair     lft foot  . Hernia repair     lft  . Wrist ganglion excision     lft  . Tonsillectomy   . Cardiac catheterization   . Inguinal hernia repair 07/02/2011    Procedure: LAPAROSCOPIC INGUINAL HERNIA;  Surgeon: Shelly Rubenstein, MD;  Location: [facility] OR;  Service: General;  Laterality: Left;  Laparoscopic left inguinal hernia repair and mesh    Family History  Problem Relation Age of Onset  . Stroke Father   . Lung cancer Mother 66  . Stroke Mother   . Colon cancer Neg Hx     No Known Allergies  Current Outpatient Prescriptions on File Prior to Visit  Medication Sig Dispense Refill  . ALPRAZolam (XANAX) 0.5 MG tablet Take 0.5 mg by mouth every 6 (six) hours as  needed. For anxiety      . amLODipine (NORVASC) 5 MG tablet TAKE 1 TABLET (5 MG TOTAL) BY MOUTH DAILY.  30 tablet  5  . b complex vitamins tablet Take 1 tablet by mouth daily.       Marland Kitchen doxepin (SINEQUAN) 10 MG capsule Take 10 mg by mouth at bedtime.      . finasteride (PROPECIA) 1 MG tablet Take 1 mg by mouth daily.      . fish oil-omega-3 fatty acids 1000 MG capsule Take 1 g by mouth daily.       . Flaxseed, Linseed, (RA FLAX SEED OIL 1000 PO) Take 1,000 mg by mouth 3 (three) times daily.       . Glucosamine Sulfate (GLUCOSAMINE RELIEF) 1000 MG TABS Take 1 tablet by mouth 2 (two) times daily.       . meloxicam (MOBIC) 15 MG tablet TAKE 1 TABLET EVERY DAY  30 tablet  5  . metoprolol succinate  (TOPROL-XL) 25 MG 24 hr tablet Take 25 mg by mouth 2 (two) times daily as needed.      . Milk Thistle 500 MG CAPS Take 1 capsule by mouth 2 (two) times daily.       . niacinamide 500 MG tablet Take 1,000 mg by mouth daily.       Marland Kitchen omeprazole (PRILOSEC) 20 MG capsule TAKE 1 CAPSULE EVERY DAY  30 capsule  12  . propafenone (RYTHMOL) 225 MG tablet Take 337.5 mg by mouth 2 (two) times daily.       Marland Kitchen telmisartan (MICARDIS) 80 MG tablet Take 80 mg by mouth daily.      Marland Kitchen warfarin (COUMADIN) 5 MG tablet Take 5-7.5 mg by mouth daily. Take 5mg  daily, except take 7.5mg  on Tues, Thurs, and Sat.      . warfarin (COUMADIN) 5 MG tablet TAKE 1 TABLET EVERY DAY OR AS DIRECTED  60 tablet  5  . spironolactone (ALDACTONE) 50 MG tablet Take 1 tablet (50 mg total) by mouth daily.  90 tablet  3  . DISCONTD: omeprazole (PRILOSEC) 20 MG capsule Take 20 mg by mouth daily.        BP 136/80  Pulse 72  Temp 98.2 F (36.8 C)  Resp 16  Ht 5\' 8"  (1.727 m)  Wt 184 lb (83.462 kg)  BMI 27.98 kg/m2       Objective:   Physical Exam  Nursing note and vitals reviewed. Constitutional: He appears well-developed and well-nourished.  HENT:  Head: Normocephalic and atraumatic.  Eyes: Conjunctivae normal are normal. Pupils are equal, round, and reactive to light.  Neck: Normal range of motion. Neck supple.  Cardiovascular: Normal rate and regular rhythm.   Pulmonary/Chest: Effort normal and breath sounds normal.  Abdominal: Soft. Bowel sounds are normal.          Assessment & Plan:  Change to Aldactone 50 mg once a day and see if we get better blood pressure control as well as the positive effect on the rate.  We will monitor potassium today and have the patient come back to our lab at his next protime in one month to monitor potassium again if the potassium is increased we will have to adjust the Aldactone or resumed furosemide

## 2012-02-05 LAB — HEPATIC FUNCTION PANEL: Total Bilirubin: 0.9 mg/dL (ref 0.3–1.2)

## 2012-02-05 LAB — LIPID PANEL
Cholesterol: 222 mg/dL — ABNORMAL HIGH (ref 0–200)
HDL: 76.3 mg/dL (ref >39.00)
Total CHOL/HDL Ratio: 3
Triglycerides: 36 mg/dL (ref 0.0–149.0)
VLDL: 7.2 mg/dL (ref 0.0–40.0)

## 2012-02-20 ENCOUNTER — Encounter (HOSPITAL_COMMUNITY): Payer: Self-pay | Admitting: *Deleted

## 2012-02-20 ENCOUNTER — Other Ambulatory Visit: Payer: Self-pay | Admitting: Internal Medicine

## 2012-02-20 ENCOUNTER — Ambulatory Visit (INDEPENDENT_AMBULATORY_CARE_PROVIDER_SITE_OTHER): Payer: Medicare Other | Admitting: Family

## 2012-02-20 ENCOUNTER — Inpatient Hospital Stay (HOSPITAL_COMMUNITY)
Admission: EM | Admit: 2012-02-20 | Discharge: 2012-02-21 | DRG: 641 | Disposition: A | Discharge status: Home / Self Care | Payer: Medicare Other | Attending: Family Medicine | Admitting: Family Medicine

## 2012-02-20 ENCOUNTER — Telehealth: Payer: Self-pay | Admitting: *Deleted

## 2012-02-20 DIAGNOSIS — Z87891 Personal history of nicotine dependence: Secondary | ICD-10-CM

## 2012-02-20 DIAGNOSIS — K219 Gastro-esophageal reflux disease without esophagitis: Secondary | ICD-10-CM | POA: Diagnosis not present

## 2012-02-20 DIAGNOSIS — I480 Paroxysmal atrial fibrillation: Secondary | ICD-10-CM | POA: Diagnosis present

## 2012-02-20 DIAGNOSIS — M6740 Ganglion, unspecified site: Secondary | ICD-10-CM

## 2012-02-20 DIAGNOSIS — K117 Disturbances of salivary secretion: Secondary | ICD-10-CM

## 2012-02-20 DIAGNOSIS — I447 Left bundle-branch block, unspecified: Secondary | ICD-10-CM | POA: Diagnosis not present

## 2012-02-20 DIAGNOSIS — E875 Hyperkalemia: Secondary | ICD-10-CM

## 2012-02-20 DIAGNOSIS — E871 Hypo-osmolality and hyponatremia: Secondary | ICD-10-CM | POA: Diagnosis not present

## 2012-02-20 DIAGNOSIS — I428 Other cardiomyopathies: Secondary | ICD-10-CM | POA: Diagnosis not present

## 2012-02-20 DIAGNOSIS — Z7901 Long term (current) use of anticoagulants: Secondary | ICD-10-CM

## 2012-02-20 DIAGNOSIS — I4891 Unspecified atrial fibrillation: Secondary | ICD-10-CM | POA: Diagnosis not present

## 2012-02-20 DIAGNOSIS — I420 Dilated cardiomyopathy: Secondary | ICD-10-CM

## 2012-02-20 DIAGNOSIS — F5102 Adjustment insomnia: Secondary | ICD-10-CM

## 2012-02-20 DIAGNOSIS — Z66 Do not resuscitate: Secondary | ICD-10-CM | POA: Diagnosis present

## 2012-02-20 DIAGNOSIS — I10 Essential (primary) hypertension: Secondary | ICD-10-CM

## 2012-02-20 DIAGNOSIS — K4091 Unilateral inguinal hernia, without obstruction or gangrene, recurrent: Secondary | ICD-10-CM

## 2012-02-20 DIAGNOSIS — M1990 Unspecified osteoarthritis, unspecified site: Secondary | ICD-10-CM

## 2012-02-20 DIAGNOSIS — K4090 Unilateral inguinal hernia, without obstruction or gangrene, not specified as recurrent: Secondary | ICD-10-CM

## 2012-02-20 DIAGNOSIS — IMO0001 Reserved for inherently not codable concepts without codable children: Secondary | ICD-10-CM

## 2012-02-20 DIAGNOSIS — R0989 Other specified symptoms and signs involving the circulatory and respiratory systems: Secondary | ICD-10-CM

## 2012-02-20 DIAGNOSIS — Q231 Congenital insufficiency of aortic valve: Secondary | ICD-10-CM

## 2012-02-20 LAB — BASIC METABOLIC PANEL
BUN: 12 mg/dL (ref 6–23)
CO2: 27 mEq/L (ref 19–32)
Calcium: 8.5 mg/dL (ref 8.4–10.5)
GFR: 94.24 mL/min (ref >60.00)
Glucose, Bld: 108 mg/dL — ABNORMAL HIGH (ref 70–99)

## 2012-02-20 NOTE — Patient Instructions (Signed)
Today only, take 2 tabs. Then continue 5mg  a day except Wednesday 7.5mg . Recheck 4 weeks.    Latest dosing instructions   Total Sun Mon Tue Wed Thu Fri Sat   37.5 5 mg 5 mg 5 mg 7.5 mg 5 mg 5 mg 5 mg    (5 mg1) (5 mg1) (5 mg1) (5 mg1.5) (5 mg1) (5 mg1) (5 mg1)

## 2012-02-20 NOTE — Addendum Note (Signed)
Addended by: Rita Ohara R on: 02/20/2012 01:16 PM   Modules accepted: Orders

## 2012-02-20 NOTE — Telephone Encounter (Signed)
Received a lab result of sodium 118.  Per Dr Artist Pais patient to go to ER and have labs re-drawn.

## 2012-02-20 NOTE — ED Notes (Signed)
Pt had blood work done in office today; called back in for repeat labs due to low sodium; pt states second lab test showed sodium even lower; denies any c/o distress; states drinks a lot of water

## 2012-02-21 DIAGNOSIS — E871 Hypo-osmolality and hyponatremia: Principal | ICD-10-CM

## 2012-02-21 DIAGNOSIS — Z87891 Personal history of nicotine dependence: Secondary | ICD-10-CM | POA: Diagnosis not present

## 2012-02-21 DIAGNOSIS — I4891 Unspecified atrial fibrillation: Secondary | ICD-10-CM | POA: Diagnosis not present

## 2012-02-21 DIAGNOSIS — I10 Essential (primary) hypertension: Secondary | ICD-10-CM

## 2012-02-21 DIAGNOSIS — Z66 Do not resuscitate: Secondary | ICD-10-CM | POA: Diagnosis present

## 2012-02-21 DIAGNOSIS — I428 Other cardiomyopathies: Secondary | ICD-10-CM | POA: Diagnosis not present

## 2012-02-21 DIAGNOSIS — Z7901 Long term (current) use of anticoagulants: Secondary | ICD-10-CM | POA: Diagnosis not present

## 2012-02-21 DIAGNOSIS — K219 Gastro-esophageal reflux disease without esophagitis: Secondary | ICD-10-CM | POA: Diagnosis not present

## 2012-02-21 LAB — SODIUM, URINE, RANDOM: Sodium, Ur: 32 mEq/L

## 2012-02-21 LAB — BASIC METABOLIC PANEL
BUN: 10 mg/dL (ref 6–23)
CO2: 22 mEq/L (ref 19–32)
CO2: 25 mEq/L (ref 19–32)
Calcium: 8 mg/dL — ABNORMAL LOW (ref 8.4–10.5)
Calcium: 8.3 mg/dL — ABNORMAL LOW (ref 8.4–10.5)
Creatinine, Ser: 0.68 mg/dL (ref 0.50–1.35)
GFR calc non Af Amer: >90 mL/min (ref >90)
Glucose, Bld: 94 mg/dL (ref 70–99)
Sodium: 121 mEq/L — ABNORMAL LOW (ref 135–145)

## 2012-02-21 LAB — NA AND K (SODIUM & POTASSIUM), RAND UR: Potassium Urine: 13 mEq/L

## 2012-02-21 LAB — CBC
MCH: 35 pg — ABNORMAL HIGH (ref 26.0–34.0)
MCH: 34.6 pg — ABNORMAL HIGH (ref 26.0–34.0)
MCHC: 37.1 g/dL — ABNORMAL HIGH (ref 30.0–36.0)
MCV: 92.9 fL (ref 78.0–100.0)
MCV: 93.7 fL (ref 78.0–100.0)
Platelets: 211 10*3/uL (ref 150–400)
Platelets: 211 10*3/uL (ref 150–400)
RBC: 4.2 MIL/uL — ABNORMAL LOW (ref 4.22–5.81)
RDW: 12.5 % (ref 11.5–15.5)
WBC: 4.7 10*3/uL (ref 4.0–10.5)

## 2012-02-21 LAB — URINALYSIS, ROUTINE W REFLEX MICROSCOPIC
Bilirubin Urine: NEGATIVE
Glucose, UA: NEGATIVE mg/dL
Hgb urine dipstick: NEGATIVE
Protein, ur: NEGATIVE mg/dL
Urobilinogen, UA: 1 mg/dL (ref 0.0–1.0)

## 2012-02-21 LAB — UREA NITROGEN, URINE: Urea Nitrogen, Ur: 214 mg/dL

## 2012-02-21 LAB — TSH: TSH: 2.144 u[IU]/mL (ref 0.350–4.500)

## 2012-02-21 MED ORDER — SODIUM CHLORIDE 0.9 % IV SOLN: 250.0000 mL | INTRAVENOUS | Status: DC | PRN

## 2012-02-21 MED ORDER — PROPAFENONE HCL 225 MG PO TABS
337.5000 mg | ORAL_TABLET | Freq: Two times a day (BID) | ORAL | Status: DC
  Administered 2012-02-21: 337.5 mg via ORAL
  Filled 2012-02-21 (×3): qty 1.5

## 2012-02-21 MED ORDER — IRBESARTAN 150 MG PO TABS
150.0000 mg | ORAL_TABLET | Freq: Every day | ORAL | Status: DC
  Filled 2012-02-21: qty 1

## 2012-02-21 MED ORDER — PANTOPRAZOLE SODIUM 40 MG PO TBEC: 40.0000 mg | DELAYED_RELEASE_TABLET | Freq: Every day | ORAL | Status: DC

## 2012-02-21 MED ORDER — WARFARIN SODIUM 5 MG PO TABS: 5.0000 mg | ORAL_TABLET | Freq: Every day | ORAL | Status: DC

## 2012-02-21 MED ORDER — AMLODIPINE BESYLATE 5 MG PO TABS
5.0000 mg | ORAL_TABLET | Freq: Every day | ORAL | Status: DC
  Filled 2012-02-21: qty 1

## 2012-02-21 MED ORDER — FINASTERIDE 1 MG PO TABS: 1.0000 mg | ORAL_TABLET | Freq: Every day | ORAL | Status: DC

## 2012-02-21 MED ORDER — WARFARIN SODIUM 5 MG PO TABS
5.0000 mg | ORAL_TABLET | Freq: Once | ORAL | Status: DC
  Filled 2012-02-21: qty 1

## 2012-02-21 MED ORDER — SENNOSIDES-DOCUSATE SODIUM 8.6-50 MG PO TABS
1.0000 | ORAL_TABLET | Freq: Every evening | ORAL | Status: DC | PRN
Start: 2012-02-21 — End: 2012-02-21
  Filled 2012-02-21: qty 1

## 2012-02-21 MED ORDER — WARFARIN - PHARMACIST DOSING INPATIENT: Freq: Every day | Status: DC

## 2012-02-21 MED ORDER — SODIUM CHLORIDE 0.9 % IJ SOLN: 3.0000 mL | INTRAMUSCULAR | Status: DC | PRN

## 2012-02-21 MED ORDER — ONDANSETRON HCL 4 MG/2ML IJ SOLN: 4.0000 mg | Freq: Four times a day (QID) | INTRAMUSCULAR | Status: DC | PRN

## 2012-02-21 MED ORDER — SODIUM CHLORIDE 0.9 % IJ SOLN: 3.0000 mL | Freq: Two times a day (BID) | INTRAMUSCULAR | Status: DC

## 2012-02-21 MED ORDER — ONDANSETRON HCL 4 MG PO TABS: 4.0000 mg | ORAL_TABLET | Freq: Four times a day (QID) | ORAL | Status: DC | PRN

## 2012-02-21 NOTE — ED Provider Notes (Signed)
History     CSN: [id number]  Arrival date & time 02/20/12  2142   First MD Initiated Contact with Patient 02/20/12 2302      Chief Complaint  Patient presents with  . hyponatremia     (Consider location/radiation/quality/duration/timing/severity/associated sxs/prior treatment) HPI 71 year old male presents to emergency department due to abnormal lab test. Patient reports he was at his doctor's office today and had blood work done. He was called and informed that his sodium was low, was told the value. Patient was told to followup at [facility] lab and had it repeated. He was then called again to inform them that it was still low, and actually lower than before. Patient now presents emergency apartment for further evaluation of low sodium. Patient reports he has a chronic low value of sodium, he denies any headache, confusion, weakness, dizziness or other abnormalities. Patient thinks that he has low sodium secondary to drinking too much water and diet Cokes. He reports he was changed from Lasix to aldosterone last week. Patient has noted increased swelling in his lower extremities since the change. He denies any other new health issues.   Past Medical History  Diagnosis Date  . Arthritis   . Hypertension   . GERD (gastroesophageal reflux disease)   . Bicuspid aortic valve   . 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 Sinus bradycardia   . Paroxysmal atrial fibrillation     On Rythmol  . Cardiomyopathy -resolved     tachycardia-induced, EF 55% June 2009    Past Surgical History  Procedure Date  . Cerebral anuersym post clips   . Rotator cuff repair     lf  . Fractured left arm   . Tonsillectomy   . Left tendon repair     lft foot  . Hernia repair     lft  . Wrist ganglion excision     lft  . Tonsillectomy   . Cardiac catheterization   . Inguinal hernia repair 07/02/2011    Procedure: LAPAROSCOPIC INGUINAL HERNIA;  Surgeon: Shelly Rubenstein, MD;  Location: [facility] OR;   Service: General;  Laterality: Left;  Laparoscopic left inguinal hernia repair and mesh    Family History  Problem Relation Age of Onset  . Stroke Father   . Lung cancer Mother 63  . Stroke Mother   . Colon cancer Neg Hx     History  Substance Use Topics  . Smoking status: Former Smoker -- 1.5 packs/day    Quit date: 05/26/1986  . Smokeless tobacco: Never Used  . Alcohol Use: 4.2 oz/week    7 Glasses of wine per week     2 daily       Review of Systems  All other systems reviewed and are negative.    Allergies  Review of patient's allergies indicates no known allergies.  Home Medications   Current Outpatient Rx  Name Route Sig Dispense Refill  . AMLODIPINE BESYLATE 5 MG PO TABS Oral Take 5 mg by mouth daily.    . B COMPLEX PO TABS Oral Take 1 tablet by mouth daily.     Marland Kitchen DOXEPIN HCL 10 MG PO CAPS Oral Take 10 mg by mouth at bedtime.    Marland Kitchen FINASTERIDE 1 MG PO TABS Oral Take 1 mg by mouth daily.    . OMEGA-3 FATTY ACIDS 1000 MG PO CAPS Oral Take 1 g by mouth daily.     Marland Kitchen RA FLAX SEED OIL 1000 PO Oral Take 1,000  mg by mouth 3 (three) times daily.     Marland Kitchen GLUCOSAMINE SULFATE 1000 MG PO TABS Oral Take 1 tablet by mouth 2 (two) times daily.     . MELOXICAM 15 MG PO TABS Oral Take 15 mg by mouth daily.    Marland Kitchen MILK THISTLE 500 MG PO CAPS Oral Take 1 capsule by mouth 2 (two) times daily.     Marland Kitchen NIACINAMIDE 500 MG PO TABS Oral Take 1,000 mg by mouth daily.     Marland Kitchen OMEPRAZOLE 20 MG PO CPDR Oral Take 20 mg by mouth daily.    Marland Kitchen PROPAFENONE HCL 225 MG PO TABS Oral Take 337.5 mg by mouth 2 (two) times daily.     Marland Kitchen SPIRONOLACTONE 50 MG PO TABS Oral Take 50 mg by mouth daily.    . TELMISARTAN 80 MG PO TABS Oral Take 80 mg by mouth daily.    . WARFARIN SODIUM 5 MG PO TABS Oral Take 5-7.5 mg by mouth daily. Take 5mg  daily, except take 7.5mg  Wednesdays      BP 125/68  Pulse 67  Temp 97.7 F (36.5 C)  Resp 20  SpO2 96%  Physical Exam  Nursing note and vitals reviewed. Constitutional:  He is oriented to person, place, and time. He appears well-developed and well-nourished.  HENT:  Head: Normocephalic and atraumatic.  Nose: Nose normal.  Mouth/Throat: Oropharynx is clear and moist.  Eyes: Conjunctivae normal and EOM are normal. Pupils are equal, round, and reactive to light.  Neck: Normal range of motion. Neck supple. No JVD present. No tracheal deviation present. No thyromegaly present.  Cardiovascular: Normal rate, regular rhythm, normal heart sounds and intact distal pulses.  Exam reveals no gallop and no friction rub.   No murmur heard. Pulmonary/Chest: Effort normal and breath sounds normal. No stridor. No respiratory distress. He has no wheezes. He has no rales. He exhibits no tenderness.  Abdominal: Soft. Bowel sounds are normal. He exhibits no distension and no mass. There is no tenderness. There is no rebound and no guarding.  Musculoskeletal: Normal range of motion. He exhibits edema ( 1+ pitting eedema of bilateral lower extremities). He exhibits no tenderness.  Lymphadenopathy:    He has no cervical adenopathy.  Neurological: He is alert and oriented to person, place, and time. He exhibits normal muscle tone. Coordination normal.  Skin: Skin is warm and dry. No rash noted. No erythema. No pallor.  Psychiatric: He has a normal mood and affect. His behavior is normal. Judgment and thought content normal.    ED Course  Procedures (including critical care time)  Labs Reviewed  BASIC METABOLIC PANEL - Abnormal; Notable for the following:    Sodium 121 (*)     Chloride 89 (*)     Calcium 8.0 (*)     All other components within normal limits  URINALYSIS, ROUTINE W REFLEX MICROSCOPIC - Abnormal; Notable for the following:    APPearance CLOUDY (*)     All other components within normal limits  SODIUM, URINE, RANDOM  UREA NITROGEN, URINE  OSMOLALITY  TSH   No results found.   1. Hyponatremia   2. Atrial fibrillation       MDM  71 year old male with  reported hyponatremia. Suspect secondary to recent change in Lasix to Aldactone. We'll recheck values, get urine lites, osmolality.        Olivia Mackie, MD 02/21/12 0201

## 2012-02-21 NOTE — Progress Notes (Signed)
ANTICOAGULATION CONSULT NOTE - Follow Up  Pharmacy Consult for Warfarin Indication: atrial fibrillation  No Known Allergies  Patient Measurements: Height: 5\' 8"  (172.7 cm) Weight: 181 lb 10.5 oz (82.4 kg) IBW/kg (Calculated) : 68.4    Vital Signs: Temp: 97.9 F (36.6 C) (09/28 0403) Temp src: Oral (09/28 0403) BP: 162/84 mmHg (09/28 0403) Pulse Rate: 63  (09/28 0403)  Labs:  Alvira Philips 02/21/12 0548 02/20/12 2335 02/20/12 2331 02/20/12 1315 02/20/12  HGB -- 15.0 -- -- --  HCT -- 39.0 -- -- --  PLT -- 211 -- -- --  APTT -- -- -- -- --  LABPROT 23.9* -- -- -- --  INR 2.25* -- -- -- 2.0  HEPARINUNFRC -- -- -- -- --  CREATININE -- -- 0.68 0.9 --  CKTOTAL -- -- -- -- --  CKMB -- -- -- -- --  TROPONINI -- -- -- -- --    Estimated Creatinine Clearance: 88.6 ml/min (by C-G formula based on Cr of 0.68).   Medical History: Past Medical History  Diagnosis Date  . Arthritis   . Hypertension   . GERD (gastroesophageal reflux disease)   . Bicuspid aortic valve   . 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 Sinus bradycardia   . Paroxysmal atrial fibrillation     On Rythmol  . Cardiomyopathy -resolved     tachycardia-induced, EF 55% June 2009    Medications:  Scheduled:     . amLODipine  5 mg Oral Daily  . finasteride  1 mg Oral Daily  . irbesartan  150 mg Oral Daily  . pantoprazole  40 mg Oral Q1200  . propafenone  337.5 mg Oral BID  . sodium chloride  3 mL Intravenous Q12H  . DISCONTD: warfarin  5-7.5 mg Oral Daily   Infusions:    Assessment:  71 yo male on chronic coumadin for A-fib admitted today with hyponatremia.   Pt takes 5mg  daily, except  7.5mg  on Weds - last dose 9/27  INR therapeutic  No reported bleeding per notes  Goal of Therapy:  INR 2-3   Plan:   Continue with home dose as above - 5mg  tonight  Daily INR   Hessie Knows, PharmD, BCPS Pager [phone] 02/21/2012 6:42 AM

## 2012-02-21 NOTE — H&P (Signed)
PCP:   Carrie Mew, MD   Chief Complaint:  Abnormal sodium  HPI: This is a 71 year old gentleman who went for routine blood work today. He is on Coumadin and was checking his INR. Also he was recently started on Aldactone [change from Lasix] the physician wanted to check sodium levels. He was called at home and told to go to the ER because his sodium was low. The patient states he has no symptoms, he is not lethargic, he has no altered mentation. He states he does have chronic low sodium however this is lower than what is usual for him. History provided by the patient.  Review of Systems:  The patient denies anorexia, fever, weight loss,, vision loss, decreased hearing, hoarseness, chest pain, syncope, dyspnea on exertion, peripheral edema, balance deficits, hemoptysis, abdominal pain, melena, hematochezia, severe indigestion/heartburn, hematuria, incontinence, genital sores, muscle weakness, suspicious skin lesions, transient blindness, difficulty walking, depression, unusual weight change, abnormal bleeding, enlarged lymph nodes, angioedema, and breast masses.  Past Medical History: Past Medical History  Diagnosis Date  . Arthritis   . Hypertension   . GERD (gastroesophageal reflux disease)   . Bicuspid aortic valve   . 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 Sinus bradycardia   . Paroxysmal atrial fibrillation     On Rythmol  . Cardiomyopathy -resolved     tachycardia-induced, EF 55% June 2009   Past Surgical History  Procedure Date  . Cerebral anuersym post clips   . Rotator cuff repair     lf  . Fractured left arm   . Tonsillectomy   . Left tendon repair     lft foot  . Hernia repair     lft  . Wrist ganglion excision     lft  . Tonsillectomy   . Cardiac catheterization   . Inguinal hernia repair 07/02/2011    Procedure: LAPAROSCOPIC INGUINAL HERNIA;  Surgeon: Shelly Rubenstein, MD;  Location: [facility] OR;  Service: General;  Laterality: Left;  Laparoscopic  left inguinal hernia repair and mesh    Medications: Prior to Admission medications   Medication Sig Start Date End Date Taking? Authorizing Provider  amLODipine (NORVASC) 5 MG tablet Take 5 mg by mouth daily.   Yes Historical Provider, MD  b complex vitamins tablet Take 1 tablet by mouth daily.    Yes Historical Provider, MD  doxepin (SINEQUAN) 10 MG capsule Take 10 mg by mouth at bedtime.   Yes Historical Provider, MD  finasteride (PROPECIA) 1 MG tablet Take 1 mg by mouth daily.   Yes Historical Provider, MD  fish oil-omega-3 fatty acids 1000 MG capsule Take 1 g by mouth daily.    Yes Historical Provider, MD  Flaxseed, Linseed, (RA FLAX SEED OIL 1000 PO) Take 1,000 mg by mouth 3 (three) times daily.    Yes Historical Provider, MD  Glucosamine Sulfate (GLUCOSAMINE RELIEF) 1000 MG TABS Take 1 tablet by mouth 2 (two) times daily.    Yes Historical Provider, MD  meloxicam (MOBIC) 15 MG tablet Take 15 mg by mouth daily.   Yes Historical Provider, MD  Milk Thistle 500 MG CAPS Take 1 capsule by mouth 2 (two) times daily.    Yes Historical Provider, MD  niacinamide 500 MG tablet Take 1,000 mg by mouth daily.    Yes Historical Provider, MD  omeprazole (PRILOSEC) 20 MG capsule Take 20 mg by mouth daily.   Yes Historical Provider, MD  propafenone (RYTHMOL) 225 MG tablet Take 337.5 mg by mouth 2 (  two) times daily.  04/11/11  Yes Duke Salvia, MD  spironolactone (ALDACTONE) 50 MG tablet Take 50 mg by mouth daily.   Yes Historical Provider, MD  telmisartan (MICARDIS) 80 MG tablet Take 80 mg by mouth daily.   Yes Historical Provider, MD  warfarin (COUMADIN) 5 MG tablet Take 5-7.5 mg by mouth daily. Take 5mg  daily, except take 7.5mg  Wednesdays   Yes Historical Provider, MD    Allergies:  No Known Allergies  Social History:  reports that he quit smoking about 25 years ago. He has never used smokeless tobacco. He reports that he drinks about 4.2 ounces of alcohol per week. He reports that he does not  use illicit drugs.  Family History: Family History  Problem Relation Age of Onset  . Stroke Father   . Lung cancer Mother 32  . Stroke Mother   . Colon cancer Neg Hx     Physical Exam: Filed Vitals:   02/20/12 2157 02/20/12 2255 02/20/12 2300  BP: 131/83 107/60 125/68  Pulse: 72 63 67  Temp: 97.7 F (36.5 C)    Resp: 20 16 20   SpO2: 98% 96% 96%    General:  Alert and oriented times three, well developed and nourished, no acute distress Eyes: PERRLA, pink conjunctiva, no scleral icterus ENT: Moist oral mucosa, neck supple, no thyromegaly Lungs: clear to ascultation, no wheeze, no crackles, no use of accessory muscles Cardiovascular: regular rate and rhythm, no regurgitation, no gallops, no murmurs. No carotid bruits, no JVD Abdomen: soft, positive BS, non-tender, non-distended, no organomegaly, not an acute abdomen GU: not examined Neuro: CN II - XII grossly intact, sensation intact Musculoskeletal: strength 5/5 all extremities, no clubbing, cyanosis or edema Skin: no rash, no subcutaneous crepitation, no decubitus Psych: appropriate patient   Labs on Admission:   Basename 02/20/12 2331 02/20/12 1315  NA 121* 118*  K 4.6 4.5  CL 89* 85*  CO2 22 27  GLUCOSE 94 108*  BUN 10 12  CREATININE 0.68 0.9  CALCIUM 8.0* 8.5  MG -- --  PHOS -- --   No results found for this basename: AST:2,ALT:2,ALKPHOS:2,BILITOT:2,PROT:2,ALBUMIN:2 in the last 72 hours No results found for this basename: LIPASE:2,AMYLASE:2 in the last 72 hours No results found for this basename: WBC:2,NEUTROABS:2,HGB:2,HCT:2,MCV:2,PLT:2 in the last 72 hours No results found for this basename: CKTOTAL:3,CKMB:3,CKMBINDEX:3,TROPONINI:3 in the last 72 hours No components found with this basename: POCBNP:3 No results found for this basename: DDIMER:2 in the last 72 hours No results found for this basename: HGBA1C:2 in the last 72 hours No results found for this basename:  CHOL:2,HDL:2,LDLCALC:2,TRIG:2,CHOLHDL:2,LDLDIRECT:2 in the last 72 hours No results found for this basename: TSH,T4TOTAL,FREET3,T3FREE,THYROIDAB in the last 72 hours No results found for this basename: VITAMINB12:2,FOLATE:2,FERRITIN:2,TIBC:2,IRON:2,RETICCTPCT:2 in the last 72 hours  Micro Results: No results found for this or any previous visit (from the past 240 hour(s)).   Radiological Exams on Admission: No results found.  Assessment/Plan Present on Admission:  .Hyponatremia Admit to MedSurg  Possibly due to SIADH from Doxepin, discontinue Aldactone  Fluid restrict  Urine sodium, osmolarity, plasma osmolarity, TSH and chest x-ray ordered  Repeat BMP in a.m.  .Cardiomyopathy, rate related with resolution .Hypertension .Paroxysmal atrial fibrillation Stable resume home medication Pharmacy to manage Coumadin   DO NOT RESUSCITATE No need for DVT prophylaxis  Travis [name] 02/21/2012, 1:27 AM

## 2012-02-21 NOTE — Progress Notes (Signed)
ANTICOAGULATION CONSULT NOTE - Initial Consult  Pharmacy Consult for Warfarin Indication: atrial fibrillation  No Known Allergies  Patient Measurements:  wt=83 kg   Vital Signs: Temp: 97.7 F (36.5 C) (09/27 2157) BP: 118/73 mmHg (09/28 0238) Pulse Rate: 63  (09/28 0241)  Labs:  Alvira Philips 02/20/12 2331 02/20/12 1315 02/20/12  HGB -- -- --  HCT -- -- --  PLT -- -- --  APTT -- -- --  LABPROT -- -- --  INR -- -- 2.0  HEPARINUNFRC -- -- --  CREATININE 0.68 0.9 --  CKTOTAL -- -- --  CKMB -- -- --  TROPONINI -- -- --    The CrCl is unknown because both a height and weight (above a minimum accepted value) are required for this calculation.   Medical History: Past Medical History  Diagnosis Date  . Arthritis   . Hypertension   . GERD (gastroesophageal reflux disease)   . Bicuspid aortic valve   . 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 Sinus bradycardia   . Paroxysmal atrial fibrillation     On Rythmol  . Cardiomyopathy -resolved     tachycardia-induced, EF 55% June 2009    Medications:  Scheduled:    . amLODipine  5 mg Oral Daily  . finasteride  1 mg Oral Daily  . irbesartan  150 mg Oral Daily  . pantoprazole  40 mg Oral Q1200  . propafenone  337.5 mg Oral BID  . sodium chloride  3 mL Intravenous Q12H  . warfarin  5-7.5 mg Oral Daily   Infusions:    Assessment: 71 yo male on chronic coumadin for A-fib admitted today with hyponatremia.  Pt takes 5mg  daily, except  7.5mg  on Weds. Goal of Therapy:  INR 2-3    Plan:   Daily PT/INR  Education  Susanne Greenhouse R 02/21/2012,2:59 AM

## 2012-02-21 NOTE — ED Notes (Signed)
Report given to floor RN

## 2012-02-21 NOTE — Discharge Summary (Addendum)
Physician Discharge Summary  Patient ID: Travis [name] MRN: [id number] DOB/AGE: 1940-09-14 71 y.o.  Admit date: 02/20/2012 Discharge date: 02/21/2012  Primary Care Physician:  Carrie Mew, MD   Discharge Diagnoses:    Active Problems:  Cardiomyopathy, rate related with resolution  Bicuspid aortic valve  Paroxysmal atrial fibrillation  Hypertension  Hyponatremia      Medication List     As of 02/21/2012 10:54 AM    STOP taking these medications         spironolactone 50 MG tablet   Commonly known as: ALDACTONE      TAKE these medications         amLODipine 5 MG tablet   Commonly known as: NORVASC   Take 5 mg by mouth daily.      b complex vitamins tablet   Take 1 tablet by mouth daily.      doxepin 10 MG capsule   Commonly known as: SINEQUAN   Take 10 mg by mouth at bedtime.      finasteride 1 MG tablet   Commonly known as: PROPECIA   Take 1 mg by mouth daily.      fish oil-omega-3 fatty acids 1000 MG capsule   Take 1 g by mouth daily.      GLUCOSAMINE RELIEF 1000 MG Tabs   Generic drug: Glucosamine Sulfate   Take 1 tablet by mouth 2 (two) times daily.      meloxicam 15 MG tablet   Commonly known as: MOBIC   Take 15 mg by mouth daily.      Milk Thistle 500 MG Caps   Take 1 capsule by mouth 2 (two) times daily.      niacinamide 500 MG tablet   Take 1,000 mg by mouth daily.      omeprazole 20 MG capsule   Commonly known as: PRILOSEC   Take 20 mg by mouth daily.      propafenone 225 MG tablet   Commonly known as: RYTHMOL   Take 337.5 mg by mouth 2 (two) times daily.      RA FLAX SEED OIL 1000 PO   Take 1,000 mg by mouth 3 (three) times daily.      telmisartan 80 MG tablet   Commonly known as: MICARDIS   Take 80 mg by mouth daily.      warfarin 5 MG tablet   Commonly known as: COUMADIN   Take 5-7.5 mg by mouth daily. Take 5mg  daily, except take 7.5mg  Wednesdays           Disposition and Follow-up:  Will be discharged home  today in stable condition. Advised to secure a follow up with his PCP for early next week to follow on his Na levels.  Consults:  None   Significant Diagnostic Studies:  No results found.  Brief H and P: For complete details please refer to admission H and P, but in brief patient is a 71 year old gentleman who went for routine blood work today. He is on Coumadin and was checking his INR. Also he was recently started on Aldactone [change from Lasix] the physician wanted to check sodium levels. He was called at home and told to go to the ER because his sodium was low. The patient states he has no symptoms, he is not lethargic, he has no altered mentation. He states he does have chronic low sodium however this is lower than what is usual for him. History provided by the patient. We were asked to admit  him for further evaluation and management.     Hospital Course:  Active Problems:  Cardiomyopathy, rate related with resolution  Bicuspid aortic valve  Paroxysmal atrial fibrillation  Hypertension  Hyponatremia   Hyponatremia -On admission sodium was 118. -Up to 123 today with holding spironolactone and IVF. -He is completely asymptomatic. -As far back as 2011, I have been able to document a sodium level between 124-132. -Has been advised to discontinue use of Aldactone until he follows up with his PCP and/or cardiologist.  Rest of chronic medical conditions have been stable this hospitalization.  Time spent on Discharge: Greater than 30 minutes.  SignedChaya Jan [facility] Pager: [phone] 02/21/2012, 10:54 AM

## 2012-02-23 ENCOUNTER — Other Ambulatory Visit: Payer: Self-pay | Admitting: *Deleted

## 2012-02-23 ENCOUNTER — Telehealth: Payer: Self-pay | Admitting: Family Medicine

## 2012-02-23 DIAGNOSIS — E871 Hypo-osmolality and hyponatremia: Secondary | ICD-10-CM

## 2012-02-23 NOTE — Telephone Encounter (Signed)
Referral to endicrinology and to repeat b met on wed-pt aware

## 2012-02-23 NOTE — Telephone Encounter (Signed)
[facility] Triage Call Report Triage Record Num: [id number] Operator: Albertine Grates Patient Name: Travis [name] Call Date & Time: 02/20/2012 8:55:29PM Patient Phone: [phone] PCP: Darryll Capers Patient Gender: Male PCP Fax : [phone] Patient DOB: 1941-05-13 Practice Name: [facility] Reason for Call: Caller: Zella Ball; PCP: Darryll Capers (Adults only); CB#: [phone]; [facility] calling and states patient had BMP done at [facility] 9-27. Sodium is 117. Per EPIC, patient had sodium level 118 when in office 9-27 and sent to [facility]. [facility] notified and patient was seen in ED to have blood drawn and then sent home. Dr. Milinda Antis notified. Advised to send patient to ED for eval. Patient notified and is in agreement. Protocol(s) Used: Office Note Recommended Outcome per Protocol: Information Noted and Sent to Office Reason for Outcome: Caller information to office Care Advice: ~ 09/27/

## 2012-02-25 ENCOUNTER — Other Ambulatory Visit (INDEPENDENT_AMBULATORY_CARE_PROVIDER_SITE_OTHER): Payer: Medicare Other

## 2012-02-25 DIAGNOSIS — E871 Hypo-osmolality and hyponatremia: Secondary | ICD-10-CM | POA: Diagnosis not present

## 2012-02-25 DIAGNOSIS — IMO0001 Reserved for inherently not codable concepts without codable children: Secondary | ICD-10-CM

## 2012-02-25 LAB — BASIC METABOLIC PANEL
BUN: 12 mg/dL (ref 6–23)
Chloride: 91 mEq/L — ABNORMAL LOW (ref 96–112)
Glucose, Bld: 118 mg/dL — ABNORMAL HIGH (ref 70–99)
Potassium: 4.2 mEq/L (ref 3.5–5.1)

## 2012-03-03 ENCOUNTER — Telehealth: Payer: Self-pay | Admitting: Internal Medicine

## 2012-03-03 ENCOUNTER — Other Ambulatory Visit: Payer: Self-pay | Admitting: Cardiology

## 2012-03-03 DIAGNOSIS — I4891 Unspecified atrial fibrillation: Secondary | ICD-10-CM

## 2012-03-03 MED ORDER — PROPAFENONE HCL 225 MG PO TABS: 337.5000 mg | ORAL_TABLET | Freq: Two times a day (BID) | ORAL | Status: DC

## 2012-03-03 NOTE — Telephone Encounter (Signed)
New Problem:    Called in requesting a refill of the patient's propafenone (RYTHMOL) 225 MG tablet.

## 2012-03-04 ENCOUNTER — Ambulatory Visit: Payer: Medicare Other | Admitting: Internal Medicine

## 2012-03-09 ENCOUNTER — Encounter: Payer: Self-pay | Admitting: Internal Medicine

## 2012-03-09 ENCOUNTER — Ambulatory Visit (INDEPENDENT_AMBULATORY_CARE_PROVIDER_SITE_OTHER): Payer: Medicare Other | Admitting: Internal Medicine

## 2012-03-09 VITALS — BP 110/76 | HR 63 | Ht 68.0 in | Wt 186.2 lb

## 2012-03-09 DIAGNOSIS — I4891 Unspecified atrial fibrillation: Secondary | ICD-10-CM

## 2012-03-09 DIAGNOSIS — I272 Pulmonary hypertension, unspecified: Secondary | ICD-10-CM | POA: Insufficient documentation

## 2012-03-09 MED ORDER — PROPAFENONE HCL 225 MG PO TABS: 337.5000 mg | ORAL_TABLET | Freq: Two times a day (BID) | ORAL | Status: DC

## 2012-03-09 NOTE — Assessment & Plan Note (Addendum)
I have asked Dr. Shirlee Latch to review the echo and see whether this is just a calcific valve that is evolving into a bicuspid physiology or likely represents a congenital bicuspid valve which could be associated with aortopathy

## 2012-03-09 NOTE — Assessment & Plan Note (Signed)
Patient is noted to have pulmonary hypertension on echo corroborated by physical examination. My rad is E/E'. prime is 52 which would correlate with an elevated LVEDP and suggest that this is pulmonary protrusion secondary to left ventricular end-diastolic pressure elevation. I will clarify this with Dr. Shirlee Latch.

## 2012-03-09 NOTE — Patient Instructions (Addendum)
Your physician wants you to follow-up in: 6 months with Dr. Klein. You will receive a reminder letter in the mail two months in advance. If you don't receive a letter, please call our office to schedule the follow-up appointment.  

## 2012-03-09 NOTE — Progress Notes (Signed)
Patient Care Team: Stacie Glaze, MD as PCP - General   HPI  Travis [name] is a 71 y.o. male Seen in followup of paroxysmal atrial fibrillation and tachycardia-induced cardiomyopathy seen in followup today. He has been maintained on Rythmol and has had infrequent recurrences   Most recent echo 7/13 >>EF 55% w A now recognized bicuspid aortic valve;  he was also noted to have pulmonary hypertension.   He's had only infrequent palpitations. He's had no peripheral edema. His Aldactone was apparently stopped because of hyperkalemia   Past Medical History  Diagnosis Date  . Arthritis   . Hypertension   . GERD (gastroesophageal reflux disease)   . Bicuspid aortic valve   . 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 Sinus bradycardia   . Paroxysmal atrial fibrillation     On Rythmol  . Cardiomyopathy -resolved     tachycardia-induced, EF 55% June 2009    Past Surgical History  Procedure Date  . Cerebral anuersym post clips   . Rotator cuff repair     lf  . Fractured left arm   . Tonsillectomy   . Left tendon repair     lft foot  . Hernia repair     lft  . Wrist ganglion excision     lft  . Tonsillectomy   . Cardiac catheterization   . Inguinal hernia repair 07/02/2011    Procedure: LAPAROSCOPIC INGUINAL HERNIA;  Surgeon: Shelly Rubenstein, MD;  Location: [facility] OR;  Service: General;  Laterality: Left;  Laparoscopic left inguinal hernia repair and mesh    Current Outpatient Prescriptions  Medication Sig Dispense Refill  . amLODipine (NORVASC) 5 MG tablet Take 5 mg by mouth daily.      Marland Kitchen b complex vitamins tablet Take 1 tablet by mouth daily.       Marland Kitchen doxepin (SINEQUAN) 10 MG capsule Take 10 mg by mouth at bedtime.      . finasteride (PROPECIA) 1 MG tablet Take 1 mg by mouth daily.      . fish oil-omega-3 fatty acids 1000 MG capsule Take 1 g by mouth daily.       . Flaxseed, Linseed, (RA FLAX SEED OIL 1000 PO) Take 1,000 mg by mouth 3 (three) times daily.       .  furosemide (LASIX) 20 MG tablet Take 20 mg by mouth 2 (two) times daily.      . Glucosamine Sulfate (GLUCOSAMINE RELIEF) 1000 MG TABS Take 1 tablet by mouth 2 (two) times daily.       . meloxicam (MOBIC) 15 MG tablet Take 15 mg by mouth daily.      . Milk Thistle 500 MG CAPS Take 1 capsule by mouth 2 (two) times daily.       . niacinamide 500 MG tablet Take 1,000 mg by mouth daily.       Marland Kitchen omeprazole (PRILOSEC) 20 MG capsule Take 20 mg by mouth daily.      . propafenone (RYTHMOL) 225 MG tablet Take 1.5 tablets (337.5 mg total) by mouth 2 (two) times daily.  45 tablet  5  . telmisartan (MICARDIS) 80 MG tablet Take 80 mg by mouth daily.      Marland Kitchen warfarin (COUMADIN) 5 MG tablet Take 5-7.5 mg by mouth daily. Take 5mg  daily, except take 7.5mg  Wednesdays        No Known Allergies  Review of Systems negative except from HPI and PMH  Physical Exam BP 110/76  Pulse 63  Ht 5\' 8"  (1.727 m)  Wt 186 lb 4 oz (84.482 kg)  BMI 28.32 kg/m2 Well developed and well nourished in no acute distress HENT normal E scleral and icterus clear Neck Supple JVP flat; carotids brisk and full Clear to ausculation Regular rate and rhythm P2 is increased; there is a 2/6 systolic murmur radiating out to the apex. Soft with active bowel sounds No clubbing cyanosis none Edema Alert and oriented, grossly normal motor and sensory function Skin Warm and Dry  ECG demonstrates sinus rhythm at 61 intervals 20/15/42 Axis -45 Left bundle branch block  Assessment and  Plan

## 2012-03-09 NOTE — Assessment & Plan Note (Signed)
Well-controlled on current meds 

## 2012-03-09 NOTE — Assessment & Plan Note (Signed)
Infrequent recurrences 

## 2012-03-16 DIAGNOSIS — M6740 Ganglion, unspecified site: Secondary | ICD-10-CM | POA: Diagnosis not present

## 2012-03-19 ENCOUNTER — Ambulatory Visit (INDEPENDENT_AMBULATORY_CARE_PROVIDER_SITE_OTHER): Payer: Medicare Other | Admitting: Family

## 2012-03-19 DIAGNOSIS — I428 Other cardiomyopathies: Secondary | ICD-10-CM | POA: Diagnosis not present

## 2012-03-19 DIAGNOSIS — I420 Dilated cardiomyopathy: Secondary | ICD-10-CM

## 2012-03-19 DIAGNOSIS — I447 Left bundle-branch block, unspecified: Secondary | ICD-10-CM

## 2012-03-19 NOTE — Patient Instructions (Addendum)
Continue 5mg a day except Wednesday 7.5mg. Recheck 4 weeks.    Latest dosing instructions   Total Sun Mon Tue Wed Thu Fri Sat   37.5 5 mg 5 mg 5 mg 7.5 mg 5 mg 5 mg 5 mg    (5 mg1) (5 mg1) (5 mg1) (5 mg1.5) (5 mg1) (5 mg1) (5 mg1)        

## 2012-03-30 DIAGNOSIS — E871 Hypo-osmolality and hyponatremia: Secondary | ICD-10-CM | POA: Diagnosis not present

## 2012-03-31 DIAGNOSIS — E871 Hypo-osmolality and hyponatremia: Secondary | ICD-10-CM | POA: Diagnosis not present

## 2012-03-31 DIAGNOSIS — E236 Other disorders of pituitary gland: Secondary | ICD-10-CM | POA: Diagnosis not present

## 2012-04-04 ENCOUNTER — Other Ambulatory Visit: Payer: Self-pay | Admitting: Internal Medicine

## 2012-04-05 ENCOUNTER — Telehealth: Payer: Self-pay | Admitting: Family Medicine

## 2012-04-05 NOTE — Telephone Encounter (Signed)
Left message on machine It was September 27 that first lab showed hyponatremia

## 2012-04-05 NOTE — Telephone Encounter (Signed)
Dr. Lucianne Muss wants to know how long the pt's Na+ has been below normal. Please call April with the info. Thanks.

## 2012-04-14 DIAGNOSIS — D481 Neoplasm of uncertain behavior of connective and other soft tissue: Secondary | ICD-10-CM | POA: Diagnosis not present

## 2012-04-14 DIAGNOSIS — D2110 Benign neoplasm of connective and other soft tissue of unspecified upper limb, including shoulder: Secondary | ICD-10-CM | POA: Diagnosis not present

## 2012-04-16 ENCOUNTER — Ambulatory Visit (INDEPENDENT_AMBULATORY_CARE_PROVIDER_SITE_OTHER): Payer: Medicare Other | Admitting: Family

## 2012-04-16 DIAGNOSIS — Z7901 Long term (current) use of anticoagulants: Secondary | ICD-10-CM

## 2012-04-16 DIAGNOSIS — I428 Other cardiomyopathies: Secondary | ICD-10-CM | POA: Diagnosis not present

## 2012-04-16 DIAGNOSIS — I447 Left bundle-branch block, unspecified: Secondary | ICD-10-CM

## 2012-04-16 DIAGNOSIS — I420 Dilated cardiomyopathy: Secondary | ICD-10-CM

## 2012-04-16 NOTE — Patient Instructions (Addendum)
Today only, take 1/2 tab. Continue 5mg  a day except Wednesday 7.5mg . Recheck 4 weeks.    Latest dosing instructions   Total Sun Mon Tue Wed Thu Fri Sat   37.5 5 mg 5 mg 5 mg 7.5 mg 5 mg 5 mg 5 mg    (5 mg1) (5 mg1) (5 mg1) (5 mg1.5) (5 mg1) (5 mg1) (5 mg1)

## 2012-04-20 ENCOUNTER — Ambulatory Visit (INDEPENDENT_AMBULATORY_CARE_PROVIDER_SITE_OTHER): Payer: Medicare Other | Admitting: Internal Medicine

## 2012-04-20 ENCOUNTER — Encounter: Payer: Self-pay | Admitting: Internal Medicine

## 2012-04-20 VITALS — BP 126/78 | HR 72 | Temp 98.2°F | Resp 16 | Ht 68.0 in | Wt 190.0 lb

## 2012-04-20 DIAGNOSIS — E236 Other disorders of pituitary gland: Secondary | ICD-10-CM | POA: Diagnosis not present

## 2012-04-20 DIAGNOSIS — E222 Syndrome of inappropriate secretion of antidiuretic hormone: Secondary | ICD-10-CM

## 2012-04-20 DIAGNOSIS — I4891 Unspecified atrial fibrillation: Secondary | ICD-10-CM | POA: Diagnosis not present

## 2012-04-20 NOTE — Patient Instructions (Signed)
Practical paleo 

## 2012-04-20 NOTE — Progress Notes (Signed)
Subjective:    Patient ID: Travis [name], male    DOB: 02/16/1941, 71 y.o.   MRN: [id number]  HPI  Sodium and SIADH stable   Review of Systems  Constitutional: Negative for fever and fatigue.  HENT: Negative for hearing loss, congestion, neck pain and postnasal drip.   Eyes: Negative for discharge, redness and visual disturbance.  Respiratory: Negative for cough, shortness of breath and wheezing.   Cardiovascular: Negative for leg swelling.  Gastrointestinal: Negative for abdominal pain, constipation and abdominal distention.  Genitourinary: Negative for urgency and frequency.  Musculoskeletal: Negative for joint swelling and arthralgias.  Skin: Negative for color change and rash.  Neurological: Negative for weakness and light-headedness.  Hematological: Negative for adenopathy.  Psychiatric/Behavioral: Negative for behavioral problems.   Past Medical History  Diagnosis Date  . Arthritis   . Hypertension   . GERD (gastroesophageal reflux disease)   . Bicuspid aortic valve   . 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 Sinus bradycardia   . Paroxysmal atrial fibrillation     On Rythmol  . Cardiomyopathy -resolved     tachycardia-induced, EF 55% June 2009    History   Social History  . Marital Status: Single    Spouse Name: N/A    Number of Children: 0  . Years of Education: N/A   Occupational History  . Retired    Social History Main Topics  . Smoking status: Former Smoker -- 1.5 packs/day    Quit date: 05/26/1986  . Smokeless tobacco: Never Used  . Alcohol Use: 4.2 oz/week    7 Glasses of wine per week     Comment: 2 daily   . Drug Use: No  . Sexually Active: Yes   Other Topics Concern  . Not on file   Social History Narrative   0 caffeine drinks daily     Past Surgical History  Procedure Date  . Cerebral anuersym post clips   . Rotator cuff repair     lf  . Fractured left arm   . Tonsillectomy   . Left tendon repair     lft foot  .  Hernia repair     lft  . Wrist ganglion excision     lft  . Tonsillectomy   . Cardiac catheterization   . Inguinal hernia repair 07/02/2011    Procedure: LAPAROSCOPIC INGUINAL HERNIA;  Surgeon: Shelly Rubenstein, MD;  Location: [facility] OR;  Service: General;  Laterality: Left;  Laparoscopic left inguinal hernia repair and mesh    Family History  Problem Relation Age of Onset  . Stroke Father   . Lung cancer Mother 24  . Stroke Mother   . Colon cancer Neg Hx     No Known Allergies  Current Outpatient Prescriptions on File Prior to Visit  Medication Sig Dispense Refill  . amLODipine (NORVASC) 5 MG tablet Take 5 mg by mouth daily.      Marland Kitchen b complex vitamins tablet Take 1 tablet by mouth daily.       Marland Kitchen doxepin (SINEQUAN) 10 MG capsule Take 10 mg by mouth at bedtime.      Marland Kitchen doxepin (SINEQUAN) 10 MG capsule TAKE ONE CAPSULE BY MOUTH AT BEDTIME  30 capsule  5  . finasteride (PROPECIA) 1 MG tablet Take 1 mg by mouth daily.      . fish oil-omega-3 fatty acids 1000 MG capsule Take 1 g by mouth daily.       . Flaxseed, Linseed, (RA FLAX  SEED OIL 1000 PO) Take 1,000 mg by mouth 3 (three) times daily.       . furosemide (LASIX) 20 MG tablet Take 20 mg by mouth 2 (two) times daily as needed.       . Glucosamine Sulfate (GLUCOSAMINE RELIEF) 1000 MG TABS Take 1 tablet by mouth 2 (two) times daily.       . meloxicam (MOBIC) 15 MG tablet Take 15 mg by mouth daily.      . Milk Thistle 500 MG CAPS Take 1 capsule by mouth 2 (two) times daily.       . niacinamide 500 MG tablet Take 1,000 mg by mouth daily.       Marland Kitchen omeprazole (PRILOSEC) 20 MG capsule Take 20 mg by mouth daily.      . propafenone (RYTHMOL) 225 MG tablet Take 1.5 tablets (337.5 mg total) by mouth 2 (two) times daily.  90 tablet  6  . telmisartan (MICARDIS) 80 MG tablet Take 80 mg by mouth daily.      Marland Kitchen warfarin (COUMADIN) 5 MG tablet Take 5-7.5 mg by mouth daily. Take 5mg  daily, except take 7.5mg  Wednesdays        BP 126/78  Pulse 72  Temp  98.2 F (36.8 C)  Resp 16  Ht 5\' 8"  (1.727 m)  Wt 190 lb (86.183 kg)  BMI 28.89 kg/m2       Objective:   Physical Exam  Nursing note and vitals reviewed. Constitutional: He appears well-developed and well-nourished.  HENT:  Head: Normocephalic and atraumatic.  Eyes: Conjunctivae normal are normal. Pupils are equal, round, and reactive to light.  Neck: Normal range of motion. Neck supple.  Cardiovascular: Normal rate and regular rhythm.   Pulmonary/Chest: Effort normal and breath sounds normal.  Abdominal: Soft. Bowel sounds are normal.          Assessment & Plan:  SIADH Monitored sodium with Dr Lucianne Muss Afib Stable Discussion of diet Gluten free

## 2012-04-29 DIAGNOSIS — E871 Hypo-osmolality and hyponatremia: Secondary | ICD-10-CM | POA: Diagnosis not present

## 2012-05-03 DIAGNOSIS — E236 Other disorders of pituitary gland: Secondary | ICD-10-CM | POA: Diagnosis not present

## 2012-05-03 DIAGNOSIS — E871 Hypo-osmolality and hyponatremia: Secondary | ICD-10-CM | POA: Diagnosis not present

## 2012-05-10 DIAGNOSIS — L821 Other seborrheic keratosis: Secondary | ICD-10-CM | POA: Diagnosis not present

## 2012-05-14 ENCOUNTER — Ambulatory Visit (INDEPENDENT_AMBULATORY_CARE_PROVIDER_SITE_OTHER): Payer: Medicare Other | Admitting: Family

## 2012-05-14 DIAGNOSIS — I428 Other cardiomyopathies: Secondary | ICD-10-CM

## 2012-05-14 DIAGNOSIS — I447 Left bundle-branch block, unspecified: Secondary | ICD-10-CM | POA: Diagnosis not present

## 2012-05-14 DIAGNOSIS — I420 Dilated cardiomyopathy: Secondary | ICD-10-CM

## 2012-05-14 NOTE — Patient Instructions (Signed)
Continue 5mg  a day except Wednesday 7.5mg . Recheck 4 weeks.    Latest dosing instructions   Total Sun Mon Tue Wed Thu Fri Sat   37.5 5 mg 5 mg 5 mg 7.5 mg 5 mg 5 mg 5 mg    (5 mg1) (5 mg1) (5 mg1) (5 mg1.5) (5 mg1) (5 mg1) (5 mg1)

## 2012-06-11 ENCOUNTER — Ambulatory Visit (INDEPENDENT_AMBULATORY_CARE_PROVIDER_SITE_OTHER): Payer: Medicare Other | Admitting: Family

## 2012-06-11 DIAGNOSIS — I420 Dilated cardiomyopathy: Secondary | ICD-10-CM

## 2012-06-11 DIAGNOSIS — I428 Other cardiomyopathies: Secondary | ICD-10-CM | POA: Diagnosis not present

## 2012-06-11 DIAGNOSIS — Z7901 Long term (current) use of anticoagulants: Secondary | ICD-10-CM

## 2012-06-11 DIAGNOSIS — I447 Left bundle-branch block, unspecified: Secondary | ICD-10-CM

## 2012-06-11 LAB — POCT INR: INR: 2.4

## 2012-06-11 NOTE — Patient Instructions (Addendum)
Continue 5mg  a day except Wednesday 7.5mg . Recheck 6 weeks.    Latest dosing instructions   Total Sun Mon Tue Wed Thu Fri Sat   37.5 5 mg 5 mg 5 mg 7.5 mg 5 mg 5 mg 5 mg    (5 mg1) (5 mg1) (5 mg1) (5 mg1.5) (5 mg1) (5 mg1) (5 mg1)

## 2012-06-15 NOTE — Addendum Note (Signed)
Addended by: Reine Just on: 06/15/2012 04:12 PM   Modules accepted: Orders

## 2012-07-18 ENCOUNTER — Other Ambulatory Visit: Payer: Self-pay | Admitting: Internal Medicine

## 2012-07-23 ENCOUNTER — Ambulatory Visit (INDEPENDENT_AMBULATORY_CARE_PROVIDER_SITE_OTHER): Payer: Medicare Other | Admitting: Family

## 2012-07-23 DIAGNOSIS — I4891 Unspecified atrial fibrillation: Secondary | ICD-10-CM

## 2012-07-23 DIAGNOSIS — I428 Other cardiomyopathies: Secondary | ICD-10-CM | POA: Diagnosis not present

## 2012-07-23 DIAGNOSIS — I420 Dilated cardiomyopathy: Secondary | ICD-10-CM

## 2012-07-23 DIAGNOSIS — I447 Left bundle-branch block, unspecified: Secondary | ICD-10-CM

## 2012-07-23 LAB — POCT INR: INR: 4.1

## 2012-07-23 NOTE — Patient Instructions (Addendum)
Hold Coumadin x 2 days. Eat a few extra servings of greens. Continue 5mg  a day except Wednesday 7.5mg . Recheck in 2 weeks.   Anticoagulation Dose Instructions as of 07/23/2012     Glynis Smiles Tue Wed Thu Fri Sat   New Dose 5 mg 5 mg 5 mg 7.5 mg 5 mg 5 mg 5 mg    Description       Hold Coumadin x 2 days. Eat a few extra servings of greens. Continue 5mg  a day except Wednesday 7.5mg . Recheck in 2 weeks.

## 2012-07-24 ENCOUNTER — Other Ambulatory Visit: Payer: Self-pay | Admitting: Internal Medicine

## 2012-08-06 ENCOUNTER — Ambulatory Visit (INDEPENDENT_AMBULATORY_CARE_PROVIDER_SITE_OTHER): Payer: Medicare Other | Admitting: Family

## 2012-08-06 DIAGNOSIS — I428 Other cardiomyopathies: Secondary | ICD-10-CM

## 2012-08-06 DIAGNOSIS — I447 Left bundle-branch block, unspecified: Secondary | ICD-10-CM | POA: Diagnosis not present

## 2012-08-06 DIAGNOSIS — I420 Dilated cardiomyopathy: Secondary | ICD-10-CM

## 2012-08-06 LAB — POCT INR: INR: 1.8

## 2012-08-06 NOTE — Patient Instructions (Addendum)
Take an extra 1/2 tab today only. Continue 5mg  a day except Wednesday 7.5mg . Recheck in 4 weeks.   Anticoagulation Dose Instructions as of 08/06/2012     Glynis Smiles Tue Wed Thu Fri Sat   New Dose 5 mg 5 mg 5 mg 7.5 mg 5 mg 5 mg 5 mg    Description       Take an extra 1/2 tab today only. Continue 5mg  a day except Wednesday 7.5mg . Recheck in 4 weeks.

## 2012-08-18 ENCOUNTER — Encounter: Payer: Self-pay | Admitting: Internal Medicine

## 2012-08-18 ENCOUNTER — Ambulatory Visit (INDEPENDENT_AMBULATORY_CARE_PROVIDER_SITE_OTHER): Payer: Medicare Other | Admitting: Internal Medicine

## 2012-08-18 VITALS — BP 120/62 | HR 66 | Temp 97.5°F | Wt 190.0 lb

## 2012-08-18 DIAGNOSIS — I4891 Unspecified atrial fibrillation: Secondary | ICD-10-CM | POA: Diagnosis not present

## 2012-08-18 DIAGNOSIS — E871 Hypo-osmolality and hyponatremia: Secondary | ICD-10-CM

## 2012-08-18 NOTE — Progress Notes (Signed)
Subjective:    Patient ID: Travis [name], male    DOB: Aug 12, 1940, 72 y.o.   MRN: [id number]  HPI Monitoring for chronic atrial fibrillation and a history of hyponatremia of uncertain etiology.  A history of bicuspid aortic valve.  Last sodium was 124 renal function stable stable history of anticoagulation   Review of Systems  Constitutional: Negative for fever and fatigue.  HENT: Negative for hearing loss, congestion, neck pain and postnasal drip.   Eyes: Negative for discharge, redness and visual disturbance.  Respiratory: Negative for cough, shortness of breath and wheezing.   Cardiovascular: Negative for leg swelling.  Gastrointestinal: Negative for abdominal pain, constipation and abdominal distention.  Genitourinary: Negative for urgency and frequency.  Musculoskeletal: Negative for joint swelling and arthralgias.  Skin: Negative for color change and rash.  Neurological: Negative for weakness and light-headedness.  Hematological: Negative for adenopathy.  Psychiatric/Behavioral: Negative for behavioral problems.   Past Medical History  Diagnosis Date  . Arthritis   . Hypertension   . GERD (gastroesophageal reflux disease)   . Bicuspid aortic valve   . 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 Sinus bradycardia   . Paroxysmal atrial fibrillation     On Rythmol  . Cardiomyopathy -resolved     tachycardia-induced, EF 55% June 2009    History   Social History  . Marital Status: Single    Spouse Name: N/A    Number of Children: 0  . Years of Education: N/A   Occupational History  . Retired    Social History Main Topics  . Smoking status: Former Smoker -- 1.50 packs/day    Quit date: 05/26/1986  . Smokeless tobacco: Never Used  . Alcohol Use: 4.2 oz/week    7 Glasses of wine per week     Comment: 2 daily   . Drug Use: No  . Sexually Active: Yes   Other Topics Concern  . Not on file   Social History Narrative   0 caffeine drinks daily     Past  Surgical History  Procedure Laterality Date  . Cerebral anuersym post clips    . Rotator cuff repair      lf  . Fractured left arm    . Tonsillectomy    . Left tendon repair      lft foot  . Hernia repair      lft  . Wrist ganglion excision      lft  . Tonsillectomy    . Cardiac catheterization    . Inguinal hernia repair  07/02/2011    Procedure: LAPAROSCOPIC INGUINAL HERNIA;  Surgeon: Shelly Rubenstein, MD;  Location: [facility] OR;  Service: General;  Laterality: Left;  Laparoscopic left inguinal hernia repair and mesh    Family History  Problem Relation Age of Onset  . Stroke Father   . Lung cancer Mother 8  . Stroke Mother   . Colon cancer Neg Hx     No Known Allergies  Current Outpatient Prescriptions on File Prior to Visit  Medication Sig Dispense Refill  . amLODipine (NORVASC) 5 MG tablet Take 5 mg by mouth daily.      Marland Kitchen b complex vitamins tablet Take 1 tablet by mouth daily.       Marland Kitchen doxepin (SINEQUAN) 10 MG capsule Take 10 mg by mouth at bedtime.      Marland Kitchen doxepin (SINEQUAN) 10 MG capsule TAKE ONE CAPSULE BY MOUTH AT BEDTIME  30 capsule  5  . finasteride (PROPECIA) 1  MG tablet Take 1 mg by mouth daily.      . fish oil-omega-3 fatty acids 1000 MG capsule Take 1 g by mouth daily.       . Flaxseed, Linseed, (RA FLAX SEED OIL 1000 PO) Take 1,000 mg by mouth 3 (three) times daily.       . furosemide (LASIX) 20 MG tablet Take 20 mg by mouth 2 (two) times daily as needed.       . Glucosamine Sulfate (GLUCOSAMINE RELIEF) 1000 MG TABS Take 1 tablet by mouth 2 (two) times daily.       . meloxicam (MOBIC) 15 MG tablet Take 15 mg by mouth daily.      . Milk Thistle 500 MG CAPS Take 1 capsule by mouth 2 (two) times daily.       . niacinamide 500 MG tablet Take 1,000 mg by mouth daily.       Marland Kitchen omeprazole (PRILOSEC) 20 MG capsule Take 20 mg by mouth daily.      . propafenone (RYTHMOL) 225 MG tablet Take 1.5 tablets (337.5 mg total) by mouth 2 (two) times daily.  90 tablet  6  .  telmisartan (MICARDIS) 80 MG tablet Take 80 mg by mouth daily.      Marland Kitchen warfarin (COUMADIN) 5 MG tablet Take 5-7.5 mg by mouth daily. Take 5mg  daily, except take 7.5mg  Wednesdays      . amLODipine (NORVASC) 5 MG tablet TAKE 1 TABLET (5 MG TOTAL) BY MOUTH DAILY.  30 tablet  5  . meloxicam (MOBIC) 15 MG tablet TAKE 1 TABLET EVERY DAY  30 tablet  5   No current facility-administered medications on file prior to visit.    BP 120/62  Pulse 66  Temp(Src) 97.5 F (36.4 C) (Oral)  Wt 190 lb (86.183 kg)  BMI 28.9 kg/m2        Objective:   Physical Exam  Constitutional: He appears well-developed and well-nourished.  HENT:  Head: Normocephalic and atraumatic.  Eyes: Conjunctivae are normal. Pupils are equal, round, and reactive to light.  Neck: Normal range of motion. Neck supple.  Cardiovascular: Normal rate and regular rhythm.   Murmur heard. Pulmonary/Chest: Effort normal and breath sounds normal.  Abdominal: Soft. Bowel sounds are normal.          Assessment & Plan:  After sodium for hyponatremia history.  Atrial fibrillation stable history of bicuspid bowel stable on anticoagulation stable followup complete physical examination in 4-5 months.  Monitor sodium

## 2012-08-23 ENCOUNTER — Encounter (INDEPENDENT_AMBULATORY_CARE_PROVIDER_SITE_OTHER): Payer: Medicare Other

## 2012-08-23 DIAGNOSIS — I6529 Occlusion and stenosis of unspecified carotid artery: Secondary | ICD-10-CM

## 2012-09-03 ENCOUNTER — Ambulatory Visit (INDEPENDENT_AMBULATORY_CARE_PROVIDER_SITE_OTHER): Payer: Medicare Other | Admitting: Family

## 2012-09-03 DIAGNOSIS — I447 Left bundle-branch block, unspecified: Secondary | ICD-10-CM

## 2012-09-03 DIAGNOSIS — I4891 Unspecified atrial fibrillation: Secondary | ICD-10-CM | POA: Diagnosis not present

## 2012-09-03 DIAGNOSIS — I428 Other cardiomyopathies: Secondary | ICD-10-CM

## 2012-09-03 DIAGNOSIS — I420 Dilated cardiomyopathy: Secondary | ICD-10-CM

## 2012-09-03 LAB — POCT INR: INR: 2.3

## 2012-09-03 NOTE — Patient Instructions (Addendum)
Continue 5mg  a day except Wednesday 7.5mg . Recheck in 4 weeks.   Anticoagulation Dose Instructions as of 09/03/2012     Glynis Smiles Tue Wed Thu Fri Sat   New Dose 5 mg 5 mg 5 mg 7.5 mg 5 mg 5 mg 5 mg    Description       Continue 5mg  a day except Wednesday 7.5mg . Recheck in 4 weeks.

## 2012-09-27 ENCOUNTER — Other Ambulatory Visit: Payer: Self-pay | Admitting: Internal Medicine

## 2012-10-01 ENCOUNTER — Ambulatory Visit (INDEPENDENT_AMBULATORY_CARE_PROVIDER_SITE_OTHER): Payer: Medicare Other | Admitting: Family

## 2012-10-01 DIAGNOSIS — I447 Left bundle-branch block, unspecified: Secondary | ICD-10-CM

## 2012-10-01 DIAGNOSIS — I4891 Unspecified atrial fibrillation: Secondary | ICD-10-CM

## 2012-10-01 DIAGNOSIS — I420 Dilated cardiomyopathy: Secondary | ICD-10-CM

## 2012-10-01 DIAGNOSIS — I428 Other cardiomyopathies: Secondary | ICD-10-CM

## 2012-10-01 LAB — POCT INR: INR: 2.4

## 2012-10-01 NOTE — Patient Instructions (Addendum)
Continue 5mg  a day except Wednesday 7.5mg . Recheck in 4 weeks.   Anticoagulation Dose Instructions as of 10/01/2012     Travis [name] Tue Wed Thu Fri Sat   New Dose 5 mg 5 mg 5 mg 7.5 mg 5 mg 5 mg 5 mg    Description       Continue 5mg  a day except Wednesday 7.5mg . Recheck in 4 weeks.

## 2012-10-03 ENCOUNTER — Other Ambulatory Visit: Payer: Self-pay | Admitting: Internal Medicine

## 2012-10-29 ENCOUNTER — Encounter: Payer: Medicare Other | Admitting: Family

## 2012-11-02 DIAGNOSIS — D481 Neoplasm of uncertain behavior of connective and other soft tissue: Secondary | ICD-10-CM | POA: Diagnosis not present

## 2012-11-02 DIAGNOSIS — D4819 Other specified neoplasm of uncertain behavior of connective and other soft tissue: Secondary | ICD-10-CM | POA: Diagnosis not present

## 2012-11-03 ENCOUNTER — Ambulatory Visit (INDEPENDENT_AMBULATORY_CARE_PROVIDER_SITE_OTHER): Payer: Medicare Other | Admitting: Family

## 2012-11-03 DIAGNOSIS — I447 Left bundle-branch block, unspecified: Secondary | ICD-10-CM | POA: Diagnosis not present

## 2012-11-03 DIAGNOSIS — I428 Other cardiomyopathies: Secondary | ICD-10-CM

## 2012-11-03 DIAGNOSIS — I420 Dilated cardiomyopathy: Secondary | ICD-10-CM

## 2012-11-03 NOTE — Patient Instructions (Addendum)
Take 1 tablet tonight. Continue 5mg  a day except Wednesday 7.5mg . Recheck in 4 weeks.  Anticoagulation Dose Instructions as of 11/03/2012     Travis [name] Tue Wed Thu Fri Sat   New Dose 5 mg 5 mg 5 mg 7.5 mg 5 mg 5 mg 5 mg    Description       Take 1 tablet tonight. Continue 5mg  a day except Wednesday 7.5mg . Recheck in 4 weeks.

## 2012-11-29 DIAGNOSIS — L723 Sebaceous cyst: Secondary | ICD-10-CM | POA: Diagnosis not present

## 2012-11-29 DIAGNOSIS — M6749 Ganglion, multiple sites: Secondary | ICD-10-CM | POA: Diagnosis not present

## 2012-11-29 DIAGNOSIS — D481 Neoplasm of uncertain behavior of connective and other soft tissue: Secondary | ICD-10-CM | POA: Diagnosis not present

## 2012-11-29 DIAGNOSIS — M65839 Other synovitis and tenosynovitis, unspecified forearm: Secondary | ICD-10-CM | POA: Diagnosis not present

## 2012-11-29 DIAGNOSIS — M65849 Other synovitis and tenosynovitis, unspecified hand: Secondary | ICD-10-CM | POA: Diagnosis not present

## 2012-12-01 ENCOUNTER — Encounter: Payer: Medicare Other | Admitting: Family

## 2012-12-02 ENCOUNTER — Ambulatory Visit (INDEPENDENT_AMBULATORY_CARE_PROVIDER_SITE_OTHER): Payer: Medicare Other | Admitting: General Practice

## 2012-12-02 DIAGNOSIS — I420 Dilated cardiomyopathy: Secondary | ICD-10-CM

## 2012-12-02 DIAGNOSIS — I447 Left bundle-branch block, unspecified: Secondary | ICD-10-CM

## 2012-12-02 DIAGNOSIS — Z7901 Long term (current) use of anticoagulants: Secondary | ICD-10-CM | POA: Diagnosis not present

## 2012-12-02 DIAGNOSIS — I428 Other cardiomyopathies: Secondary | ICD-10-CM

## 2012-12-02 LAB — POCT INR: INR: 2.8

## 2012-12-05 ENCOUNTER — Other Ambulatory Visit: Payer: Self-pay | Admitting: Internal Medicine

## 2012-12-06 ENCOUNTER — Telehealth: Payer: Self-pay | Admitting: Internal Medicine

## 2012-12-06 NOTE — Telephone Encounter (Signed)
Follow up ° ° °Pt returning your call °

## 2012-12-06 NOTE — Telephone Encounter (Signed)
Spoke with Travis [name], after his recent surgical procedure he has been out of rhythm. He has started the metoprolol as instructed but there has been no change in the rhythm. He feels fine, bp and heart rate are good. He thinks he may need another DCCV.Marland Kitchen appt made for dr Graciela Husbands to see the Travis [name] Thursday this week. He will call prior to appt with changes or problems.

## 2012-12-06 NOTE — Telephone Encounter (Signed)
Left message for pt to call.

## 2012-12-06 NOTE — Telephone Encounter (Signed)
New Prob      Pt states his heart is out of rhythm and would like to speak to nurse regarding this. Please call.

## 2012-12-07 ENCOUNTER — Encounter: Payer: Self-pay | Admitting: *Deleted

## 2012-12-09 ENCOUNTER — Encounter: Payer: Self-pay | Admitting: Internal Medicine

## 2012-12-09 ENCOUNTER — Encounter: Payer: Self-pay | Admitting: *Deleted

## 2012-12-09 ENCOUNTER — Ambulatory Visit (INDEPENDENT_AMBULATORY_CARE_PROVIDER_SITE_OTHER): Payer: Medicare Other | Admitting: Internal Medicine

## 2012-12-09 VITALS — BP 112/83 | HR 77 | Ht 67.0 in | Wt 193.0 lb

## 2012-12-09 DIAGNOSIS — Q231 Congenital insufficiency of aortic valve: Secondary | ICD-10-CM

## 2012-12-09 DIAGNOSIS — I447 Left bundle-branch block, unspecified: Secondary | ICD-10-CM | POA: Diagnosis not present

## 2012-12-09 DIAGNOSIS — I4891 Unspecified atrial fibrillation: Secondary | ICD-10-CM

## 2012-12-09 DIAGNOSIS — I480 Paroxysmal atrial fibrillation: Secondary | ICD-10-CM

## 2012-12-09 LAB — CBC WITH DIFFERENTIAL/PLATELET
Basophils Relative: 0.6 % (ref 0.0–3.0)
Eosinophils Absolute: 0.1 10*3/uL (ref 0.0–0.7)
Hemoglobin: 15.1 g/dL (ref 13.0–17.0)
Lymphocytes Relative: 35.5 % (ref 12.0–46.0)
MCHC: 34.8 g/dL (ref 30.0–36.0)
Monocytes Relative: 10.7 % (ref 3.0–12.0)
Neutro Abs: 3 10*3/uL (ref 1.4–7.7)
RBC: 4.27 Mil/uL (ref 4.22–5.81)

## 2012-12-09 NOTE — Assessment & Plan Note (Signed)
Without murmur abdomen brisk carotid upstroke. We'll reassess echo in one year

## 2012-12-09 NOTE — Assessment & Plan Note (Signed)
Recurrent atrial fibrillation. This has been infrequent. His INR is therapeutic. We'll undertake cardioversion

## 2012-12-09 NOTE — Progress Notes (Signed)
Patient Care Team: Stacie Glaze, MD as PCP - General   HPI  Travis [name] is a 72 y.o. male Seen in followup for    paroxysmal atrial fibrillation and tachycardia-induced and resolved cardiomyopathy seen in followup today.   He has been maintained on Rythmol and has had infrequent recurrences Most recent echo 7/13 >>EF 55% w a now recognized bicuspid aortic valve.   He recently underwent hand surgery and was in sinus rhtyhm and INR therapeutic; warfarin was not stopped for the surgery.  He is a little bit of edema which is chronic. He has some exercise intolerance which he dates to his atrial fibrillation.  Past Medical History  Diagnosis Date  . Arthritis   . Hypertension   . GERD (gastroesophageal reflux disease)   . Bicuspid aortic valve   . 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 Sinus bradycardia   . Paroxysmal atrial fibrillation     On Rythmol  . Cardiomyopathy -resolved     tachycardia-induced, EF 55% June 2009    Past Surgical History  Procedure Laterality Date  . Cerebral anuersym post clips    . Rotator cuff repair      lf  . Fractured left arm    . Tonsillectomy    . Left tendon repair      lft foot  . Hernia repair      lft  . Wrist ganglion excision      lft  . Tonsillectomy    . Cardiac catheterization    . Inguinal hernia repair  07/02/2011    Procedure: LAPAROSCOPIC INGUINAL HERNIA;  Surgeon: Shelly Rubenstein, MD;  Location: [facility] OR;  Service: General;  Laterality: Left;  Laparoscopic left inguinal hernia repair and mesh    Current Outpatient Prescriptions  Medication Sig Dispense Refill  . amLODipine (NORVASC) 5 MG tablet Take 5 mg by mouth daily.      Marland Kitchen amLODipine (NORVASC) 5 MG tablet TAKE 1 TABLET (5 MG TOTAL) BY MOUTH DAILY.  30 tablet  5  . b complex vitamins tablet Take 1 tablet by mouth daily.       Marland Kitchen doxepin (SINEQUAN) 10 MG capsule Take 10 mg by mouth at bedtime.      Marland Kitchen doxepin (SINEQUAN) 10 MG capsule TAKE ONE CAPSULE BY  MOUTH AT BEDTIME  30 capsule  5  . finasteride (PROPECIA) 1 MG tablet Take 1 mg by mouth daily.      . finasteride (PROSCAR) 5 MG tablet TAKE 1 TABLET EVERY DAY  30 tablet  11  . fish oil-omega-3 fatty acids 1000 MG capsule Take 1 g by mouth daily.       . Flaxseed, Linseed, (RA FLAX SEED OIL 1000 PO) Take 1,000 mg by mouth 3 (three) times daily.       . furosemide (LASIX) 20 MG tablet Take 20 mg by mouth 2 (two) times daily as needed.       . Glucosamine Sulfate (GLUCOSAMINE RELIEF) 1000 MG TABS Take 1 tablet by mouth 2 (two) times daily.       . meloxicam (MOBIC) 15 MG tablet Take 15 mg by mouth daily.      . meloxicam (MOBIC) 15 MG tablet TAKE 1 TABLET EVERY DAY  30 tablet  5  . Milk Thistle 500 MG CAPS Take 1 capsule by mouth 2 (two) times daily.       . niacinamide 500 MG tablet Take 1,000 mg by mouth daily.       Marland Kitchen  omeprazole (PRILOSEC) 20 MG capsule Take 20 mg by mouth daily.      . propafenone (RYTHMOL) 225 MG tablet TAKE 1 & 1/2 TABLET BY MOUTH TWICE A DAY  90 tablet  3  . telmisartan (MICARDIS) 80 MG tablet Take 80 mg by mouth daily.      Marland Kitchen telmisartan (MICARDIS) 80 MG tablet TAKE 1 TABLET EVERY DAY  30 tablet  9  . warfarin (COUMADIN) 5 MG tablet Take 5-7.5 mg by mouth daily. Take 5mg  daily, except take 7.5mg  Wednesdays       No current facility-administered medications for this visit.    No Known Allergies  Review of Systems negative except from HPI and PMH  Physical Exam BP 112/83  Pulse 77  Ht 5\' 7"  (1.702 m)  Wt 193 lb (87.544 kg)  BMI 30.22 kg/m2 Well developed and well nourished in no acute distress HENT normal E scleral and icterus clear Neck Supple JVP flat; carotids brisk and full Clear to ausculation .irregularly irregular rate and rhythm, no murmurs gallops or rub Soft with active bowel sounds No clubbing cyanosis Trace Edema Alert and oriented, grossly normal motor and sensory function Skin Warm and Dry  ECG demonstrates atrial fibrillation and left  bundle branch block  Assessment and  Plan

## 2012-12-09 NOTE — Patient Instructions (Addendum)
Your physician has recommended that you have a Cardioversion (DCCV). Electrical Cardioversion uses a jolt of electricity to your heart either through paddles or wired patches attached to your chest. This is a controlled, usually prescheduled, procedure. Defibrillation is done under light anesthesia in the hospital, and you usually go home the day of the procedure. This is done to get your heart back into a normal rhythm. You are not awake for the procedure. Please see the instruction sheet given to you today.  Your physician recommends that you have lab work today: cbc/inr  Your physician recommends that you continue on your current medications as directed. Please refer to the Current Medication list given to you today.

## 2012-12-09 NOTE — Assessment & Plan Note (Signed)
chronic

## 2012-12-16 ENCOUNTER — Encounter (HOSPITAL_COMMUNITY): Payer: Self-pay | Admitting: Critical Care Medicine

## 2012-12-16 ENCOUNTER — Encounter (HOSPITAL_COMMUNITY)
Admission: RE | Disposition: A | Discharge status: Home / Self Care | Payer: Self-pay | Source: Ambulatory Visit | Attending: Internal Medicine

## 2012-12-16 ENCOUNTER — Ambulatory Visit (HOSPITAL_COMMUNITY): Payer: Medicare Other | Admitting: Critical Care Medicine

## 2012-12-16 ENCOUNTER — Ambulatory Visit (HOSPITAL_COMMUNITY)
Admission: RE | Admit: 2012-12-16 | Discharge: 2012-12-16 | Disposition: A | Discharge status: Home / Self Care | Payer: Medicare Other | Source: Ambulatory Visit | Attending: Internal Medicine | Admitting: Internal Medicine

## 2012-12-16 ENCOUNTER — Encounter (HOSPITAL_COMMUNITY): Payer: Self-pay | Admitting: *Deleted

## 2012-12-16 DIAGNOSIS — I10 Essential (primary) hypertension: Secondary | ICD-10-CM | POA: Diagnosis not present

## 2012-12-16 DIAGNOSIS — Z7901 Long term (current) use of anticoagulants: Secondary | ICD-10-CM | POA: Diagnosis not present

## 2012-12-16 DIAGNOSIS — Z9889 Other specified postprocedural states: Secondary | ICD-10-CM

## 2012-12-16 DIAGNOSIS — Z791 Long term (current) use of non-steroidal anti-inflammatories (NSAID): Secondary | ICD-10-CM | POA: Diagnosis not present

## 2012-12-16 DIAGNOSIS — Q231 Congenital insufficiency of aortic valve: Secondary | ICD-10-CM | POA: Diagnosis not present

## 2012-12-16 DIAGNOSIS — M129 Arthropathy, unspecified: Secondary | ICD-10-CM | POA: Diagnosis not present

## 2012-12-16 DIAGNOSIS — Z79899 Other long term (current) drug therapy: Secondary | ICD-10-CM | POA: Insufficient documentation

## 2012-12-16 DIAGNOSIS — I4891 Unspecified atrial fibrillation: Secondary | ICD-10-CM | POA: Insufficient documentation

## 2012-12-16 DIAGNOSIS — Z9289 Personal history of other medical treatment: Secondary | ICD-10-CM

## 2012-12-16 DIAGNOSIS — K219 Gastro-esophageal reflux disease without esophagitis: Secondary | ICD-10-CM | POA: Diagnosis not present

## 2012-12-16 HISTORY — DX: Personal history of other medical treatment: Z92.89

## 2012-12-16 HISTORY — DX: Other specified postprocedural states: Z98.890

## 2012-12-16 HISTORY — PX: CARDIOVERSION: SHX1299

## 2012-12-16 LAB — BASIC METABOLIC PANEL
GFR calc non Af Amer: >90 mL/min (ref >90)
Glucose, Bld: 106 mg/dL — ABNORMAL HIGH (ref 70–99)
Potassium: 4.3 mEq/L (ref 3.5–5.1)
Sodium: 128 mEq/L — ABNORMAL LOW (ref 135–145)

## 2012-12-16 LAB — PROTIME-INR: INR: 2.8 — ABNORMAL HIGH (ref 0.00–1.49)

## 2012-12-16 SURGERY — CARDIOVERSION
Anesthesia: General

## 2012-12-16 MED ORDER — LIDOCAINE HCL (CARDIAC) 20 MG/ML IV SOLN
INTRAVENOUS | Status: DC | PRN
  Administered 2012-12-16: 100 mg via INTRAVENOUS

## 2012-12-16 MED ORDER — SODIUM CHLORIDE 0.9 % IV SOLN
INTRAVENOUS | Status: DC
  Administered 2012-12-16: 08:00:00 via INTRAVENOUS

## 2012-12-16 MED ORDER — PROPOFOL 10 MG/ML IV BOLUS
INTRAVENOUS | Status: DC | PRN
  Administered 2012-12-16: 100 mg via INTRAVENOUS

## 2012-12-16 NOTE — Preoperative (Signed)
Beta Blockers   Reason not to administer Beta Blockers:Not Applicable 

## 2012-12-16 NOTE — Anesthesia Postprocedure Evaluation (Signed)
  Anesthesia Post-op Note  Patient: Travis [name]  Procedure(s) Performed: Procedure(s): CARDIOVERSION (N/A)  Patient Location: Endoscopy Unit  Anesthesia Type:General  Level of Consciousness: awake, alert  and oriented  Airway and Oxygen Therapy: Patient Spontanous Breathing and Patient connected to nasal cannula oxygen  Post-op Pain: none  Post-op Assessment: Post-op Vital signs reviewed, Patient's Cardiovascular Status Stable, Respiratory Function Stable, Patent Airway and No signs of Nausea or vomiting  Post-op Vital Signs: Reviewed and stable  Complications: No apparent anesthesia complications

## 2012-12-16 NOTE — Anesthesia Preprocedure Evaluation (Addendum)
Anesthesia Evaluation  Patient identified by MRN, date of birth, ID band Patient awake    Reviewed: Allergy & Precautions, H&P , NPO status , Patient's Chart, lab work & pertinent test results  Airway Mallampati: I TM Distance: >3 FB Neck ROM: Full    Dental  (+) Dental Advisory Given   Pulmonary    Pulmonary exam normal       Cardiovascular hypertension, Pt. on medications + dysrhythmias Atrial Fibrillation Rhythm:Irregular     Neuro/Psych    GI/Hepatic GERD-  ,  Endo/Other    Renal/GU      Musculoskeletal  (+) Arthritis -,   Abdominal   Peds  Hematology   Anesthesia Other Findings   Reproductive/Obstetrics                          Anesthesia Physical Anesthesia Plan  ASA: III  Anesthesia Plan: General   Post-op Pain Management:    Induction: Intravenous  Airway Management Planned: Mask  Additional Equipment:   Intra-op Plan:   Post-operative Plan:   Informed Consent: I have reviewed the patients History and Physical, chart, labs and discussed the procedure including the risks, benefits and alternatives for the proposed anesthesia with the patient or authorized representative who has indicated his/her understanding and acceptance.   Dental advisory given  Plan Discussed with: Anesthesiologist and Surgeon  Anesthesia Plan Comments:        Anesthesia Quick Evaluation

## 2012-12-16 NOTE — Procedures (Signed)
Electrical Cardioversion Procedure Note Travis [name] [id number] May 26, 1941  Procedure: Electrical Cardioversion Indications:  Atrial Fibrillation  Procedure Details Consent: Risks of procedure as well as the alternatives and risks of each were explained to the (patient/caregiver).  Consent for procedure obtained. Time Out: Verified patient identification, verified procedure, site/side was marked, verified correct patient position, special equipment/implants available, medications/allergies/relevent history reviewed, required imaging and test results available.  Performed  Patient placed on cardiac monitor, pulse oximetry, supplemental oxygen as necessary.  Sedation given: Patient sedated by anesthesia with diprovan 100 mg and lidocaine 100 mg IV. Pacer pads placed anterior and posterior chest.  Cardioverted 1 time(s).  Cardioverted at 120J.  Evaluation Findings: Post procedure EKG shows: NSR Complications: None Patient did tolerate procedure well.   Travis [name] 12/16/2012, 9:07 AM

## 2012-12-16 NOTE — Transfer of Care (Signed)
Immediate Anesthesia Transfer of Care Note  Patient: Travis [name]  Procedure(s) Performed: Procedure(s): CARDIOVERSION (N/A)  Patient Location: Endoscopy Unit  Anesthesia Type:General  Level of Consciousness: awake, alert  and oriented  Airway & Oxygen Therapy: Patient Spontanous Breathing and Patient connected to nasal cannula oxygen  Post-op Assessment: Report given to PACU RN, Post -op Vital signs reviewed and stable and Patient moving all extremities X 4  Post vital signs: Reviewed and stable  Complications: No apparent anesthesia complications

## 2012-12-18 ENCOUNTER — Encounter (HOSPITAL_COMMUNITY): Payer: Self-pay | Admitting: *Deleted

## 2012-12-18 ENCOUNTER — Emergency Department (HOSPITAL_COMMUNITY): Payer: Medicare Other

## 2012-12-18 ENCOUNTER — Inpatient Hospital Stay (HOSPITAL_COMMUNITY)
Admission: EM | Admit: 2012-12-18 | Discharge: 2012-12-24 | DRG: 291 | Disposition: A | Discharge status: Home / Self Care | Payer: Medicare Other | Attending: Cardiology | Admitting: Cardiology

## 2012-12-18 DIAGNOSIS — K219 Gastro-esophageal reflux disease without esophagitis: Secondary | ICD-10-CM | POA: Diagnosis present

## 2012-12-18 DIAGNOSIS — J811 Chronic pulmonary edema: Secondary | ICD-10-CM | POA: Diagnosis not present

## 2012-12-18 DIAGNOSIS — Z801 Family history of malignant neoplasm of trachea, bronchus and lung: Secondary | ICD-10-CM

## 2012-12-18 DIAGNOSIS — Z87891 Personal history of nicotine dependence: Secondary | ICD-10-CM | POA: Diagnosis not present

## 2012-12-18 DIAGNOSIS — I5043 Acute on chronic combined systolic (congestive) and diastolic (congestive) heart failure: Secondary | ICD-10-CM | POA: Diagnosis not present

## 2012-12-18 DIAGNOSIS — I447 Left bundle-branch block, unspecified: Secondary | ICD-10-CM | POA: Diagnosis present

## 2012-12-18 DIAGNOSIS — I4891 Unspecified atrial fibrillation: Secondary | ICD-10-CM | POA: Diagnosis present

## 2012-12-18 DIAGNOSIS — J9600 Acute respiratory failure, unspecified whether with hypoxia or hypercapnia: Secondary | ICD-10-CM | POA: Diagnosis present

## 2012-12-18 DIAGNOSIS — Z79899 Other long term (current) drug therapy: Secondary | ICD-10-CM

## 2012-12-18 DIAGNOSIS — Q231 Congenital insufficiency of aortic valve: Secondary | ICD-10-CM | POA: Diagnosis not present

## 2012-12-18 DIAGNOSIS — R0602 Shortness of breath: Secondary | ICD-10-CM | POA: Diagnosis not present

## 2012-12-18 DIAGNOSIS — I428 Other cardiomyopathies: Secondary | ICD-10-CM | POA: Diagnosis not present

## 2012-12-18 DIAGNOSIS — I509 Heart failure, unspecified: Secondary | ICD-10-CM

## 2012-12-18 DIAGNOSIS — E871 Hypo-osmolality and hyponatremia: Secondary | ICD-10-CM | POA: Diagnosis present

## 2012-12-18 DIAGNOSIS — I10 Essential (primary) hypertension: Secondary | ICD-10-CM | POA: Diagnosis present

## 2012-12-18 DIAGNOSIS — Z7901 Long term (current) use of anticoagulants: Secondary | ICD-10-CM

## 2012-12-18 DIAGNOSIS — Z823 Family history of stroke: Secondary | ICD-10-CM

## 2012-12-18 DIAGNOSIS — I359 Nonrheumatic aortic valve disorder, unspecified: Secondary | ICD-10-CM | POA: Diagnosis not present

## 2012-12-18 DIAGNOSIS — I5033 Acute on chronic diastolic (congestive) heart failure: Secondary | ICD-10-CM

## 2012-12-18 HISTORY — DX: Other specified postprocedural states: Z98.890

## 2012-12-18 LAB — CBC WITH DIFFERENTIAL/PLATELET
Basophils Relative: 0 % (ref 0–1)
Eosinophils Absolute: 0.1 10*3/uL (ref 0.0–0.7)
MCH: 36 pg — ABNORMAL HIGH (ref 26.0–34.0)
MCHC: 37.5 g/dL — ABNORMAL HIGH (ref 30.0–36.0)
Neutrophils Relative %: 58 % (ref 43–77)
Platelets: 197 10*3/uL (ref 150–400)
RBC: 4.75 MIL/uL (ref 4.22–5.81)
RDW: 13.2 % (ref 11.5–15.5)

## 2012-12-18 LAB — COMPREHENSIVE METABOLIC PANEL
ALT: 24 U/L (ref 0–53)
Albumin: 3.8 g/dL (ref 3.5–5.2)
Alkaline Phosphatase: 109 U/L (ref 39–117)
Potassium: 4.4 mEq/L (ref 3.5–5.1)
Sodium: 122 mEq/L — ABNORMAL LOW (ref 135–145)
Total Protein: 7.3 g/dL (ref 6.0–8.3)

## 2012-12-18 LAB — POCT I-STAT 3, ART BLOOD GAS (G3+)
O2 Saturation: 94 %
TCO2: 22 mmol/L (ref 0–100)
pCO2 arterial: 35.6 mmHg (ref 35.0–45.0)
pH, Arterial: 7.385 (ref 7.350–7.450)
pO2, Arterial: 72 mmHg — ABNORMAL LOW (ref 80.0–100.0)

## 2012-12-18 LAB — PROTIME-INR
INR: 3.31 — ABNORMAL HIGH (ref 0.00–1.49)
Prothrombin Time: 32.4 seconds — ABNORMAL HIGH (ref 11.6–15.2)

## 2012-12-18 LAB — TROPONIN I
Troponin I: <0.3 ng/mL (ref <0.30)
Troponin I: <0.3 ng/mL (ref <0.30)

## 2012-12-18 MED ORDER — NITROGLYCERIN 0.4 MG SL SUBL: 0.4000 mg | SUBLINGUAL_TABLET | SUBLINGUAL | Status: DC | PRN

## 2012-12-18 MED ORDER — AMLODIPINE BESYLATE 5 MG PO TABS
5.0000 mg | ORAL_TABLET | Freq: Every day | ORAL | Status: DC
  Administered 2012-12-18 – 2012-12-24 (×7): 5 mg via ORAL
  Filled 2012-12-18 (×7): qty 1

## 2012-12-18 MED ORDER — ONDANSETRON HCL 4 MG/2ML IJ SOLN: 4.0000 mg | Freq: Four times a day (QID) | INTRAMUSCULAR | Status: DC | PRN

## 2012-12-18 MED ORDER — WARFARIN SODIUM 7.5 MG PO TABS: 7.5000 mg | ORAL_TABLET | ORAL | Status: DC

## 2012-12-18 MED ORDER — PROPAFENONE HCL 225 MG PO TABS
337.5000 mg | ORAL_TABLET | Freq: Two times a day (BID) | ORAL | Status: DC
  Administered 2012-12-18 – 2012-12-20 (×4): 337.5 mg via ORAL
  Filled 2012-12-18 (×5): qty 1.5

## 2012-12-18 MED ORDER — DOXEPIN HCL 10 MG PO CAPS
10.0000 mg | ORAL_CAPSULE | Freq: Every day | ORAL | Status: DC
  Administered 2012-12-18 – 2012-12-21 (×4): 10 mg via ORAL
  Filled 2012-12-18 (×5): qty 1

## 2012-12-18 MED ORDER — FUROSEMIDE 10 MG/ML IJ SOLN
40.0000 mg | Freq: Two times a day (BID) | INTRAMUSCULAR | Status: DC
  Administered 2012-12-18: 40 mg via INTRAVENOUS
  Filled 2012-12-18 (×3): qty 4

## 2012-12-18 MED ORDER — FUROSEMIDE 10 MG/ML IJ SOLN
40.0000 mg | Freq: Once | INTRAMUSCULAR | Status: AC
  Administered 2012-12-18: 40 mg via INTRAVENOUS
  Filled 2012-12-18: qty 4

## 2012-12-18 MED ORDER — SODIUM CHLORIDE 0.9 % IJ SOLN: 3.0000 mL | INTRAMUSCULAR | Status: DC | PRN

## 2012-12-18 MED ORDER — ALBUTEROL SULFATE (5 MG/ML) 0.5% IN NEBU
5.0000 mg | INHALATION_SOLUTION | Freq: Once | RESPIRATORY_TRACT | Status: AC
  Administered 2012-12-18: 5 mg via RESPIRATORY_TRACT

## 2012-12-18 MED ORDER — SODIUM CHLORIDE 0.9 % IJ SOLN
3.0000 mL | Freq: Two times a day (BID) | INTRAMUSCULAR | Status: DC
  Administered 2012-12-18 – 2012-12-22 (×8): 3 mL via INTRAVENOUS

## 2012-12-18 MED ORDER — IPRATROPIUM BROMIDE 0.02 % IN SOLN
0.5000 mg | Freq: Once | RESPIRATORY_TRACT | Status: AC
  Administered 2012-12-18: 0.5 mg via RESPIRATORY_TRACT

## 2012-12-18 MED ORDER — IRBESARTAN 75 MG PO TABS
75.0000 mg | ORAL_TABLET | Freq: Every day | ORAL | Status: DC
  Administered 2012-12-18 – 2012-12-24 (×7): 75 mg via ORAL
  Filled 2012-12-18 (×8): qty 1

## 2012-12-18 MED ORDER — SODIUM CHLORIDE 0.9 % IV SOLN: 250.0000 mL | INTRAVENOUS | Status: DC | PRN

## 2012-12-18 MED ORDER — IPRATROPIUM BROMIDE 0.02 % IN SOLN
RESPIRATORY_TRACT | Status: AC
  Filled 2012-12-18: qty 2.5

## 2012-12-18 MED ORDER — WARFARIN SODIUM 5 MG PO TABS
5.0000 mg | ORAL_TABLET | ORAL | Status: DC
  Administered 2012-12-19 – 2012-12-20 (×2): 5 mg via ORAL
  Filled 2012-12-18 (×3): qty 1

## 2012-12-18 MED ORDER — PANTOPRAZOLE SODIUM 40 MG PO TBEC
40.0000 mg | DELAYED_RELEASE_TABLET | Freq: Every day | ORAL | Status: DC
  Administered 2012-12-19 – 2012-12-24 (×6): 40 mg via ORAL
  Filled 2012-12-18 (×6): qty 1

## 2012-12-18 MED ORDER — ALBUTEROL SULFATE (5 MG/ML) 0.5% IN NEBU
INHALATION_SOLUTION | RESPIRATORY_TRACT | Status: AC
  Filled 2012-12-18: qty 1

## 2012-12-18 MED ORDER — WARFARIN - PHYSICIAN DOSING INPATIENT
Freq: Every day | Status: DC
  Administered 2012-12-19: 17:00:00

## 2012-12-18 MED ORDER — FINASTERIDE 1 MG PO TABS
1.0000 mg | ORAL_TABLET | Freq: Every day | ORAL | Status: DC
Start: 2012-12-18 — End: 2012-12-18

## 2012-12-18 MED ORDER — ACETAMINOPHEN 325 MG PO TABS: 650.0000 mg | ORAL_TABLET | ORAL | Status: DC | PRN

## 2012-12-18 NOTE — ED Notes (Signed)
Patient is resting comfortably with friends at bedside. Medicated with Lasix per MAR. Patient given urinal and asked to collect all urine output. Patient agreeable.

## 2012-12-18 NOTE — ED Provider Notes (Signed)
CSN: [id number]     Arrival date & time 12/18/12  1317 History     First MD Initiated Contact with Patient 12/18/12 1336     Chief Complaint  Patient presents with  . Shortness of Breath   (Consider location/radiation/quality/duration/timing/severity/associated sxs/prior Treatment) Patient is a 72 y.o. male presenting with shortness of breath. The history is provided by the patient.  Shortness of Breath Severity:  Severe Onset quality:  Sudden Duration:  30 minutes Timing:  Constant Progression:  Unchanged Chronicity:  New Context comment:  Recent cardioversion 2 days ago Relieved by: inhalers, O2. Worsened by:  Nothing tried Associated symptoms: no abdominal pain, no chest pain, no cough, no fever, no sputum production and no vomiting     Past Medical History  Diagnosis Date  . Arthritis   . Hypertension   . GERD (gastroesophageal reflux disease)   . Bicuspid aortic valve   . 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 Sinus bradycardia   . Paroxysmal atrial fibrillation     On Rythmol  . Cardiomyopathy -resolved     tachycardia-induced, EF 55% June 2009  . Left bundle branch block    Past Surgical History  Procedure Laterality Date  . Cerebral anuersym post clips    . Rotator cuff repair      lf  . Fractured left arm    . Tonsillectomy    . Left tendon repair      lft foot  . Hernia repair      lft  . Wrist ganglion excision      lft  . Tonsillectomy    . Inguinal hernia repair  07/02/2011    Procedure: LAPAROSCOPIC INGUINAL HERNIA;  Surgeon: Shelly Rubenstein, MD;  Location: [facility] OR;  Service: General;  Laterality: Left;  Laparoscopic left inguinal hernia repair and mesh  . Cardiac catheterization     Family History  Problem Relation Age of Onset  . Stroke Father   . Lung cancer Mother 3  . Stroke Mother   . Colon cancer Neg Hx    History  Substance Use Topics  . Smoking status: Former Smoker -- 1.50 packs/day    Quit date: 05/26/1986  .  Smokeless tobacco: Never Used  . Alcohol Use: 4.2 oz/week    7 Glasses of wine per week     Comment: 2 daily     Review of Systems  Constitutional: Negative for fever.  HENT: Negative for congestion.   Respiratory: Positive for shortness of breath. Negative for cough and sputum production.   Cardiovascular: Negative for chest pain.  Gastrointestinal: Negative for nausea, vomiting, abdominal pain and diarrhea.  All other systems reviewed and are negative.    Allergies  Review of patient's allergies indicates no known allergies.  Home Medications   Current Outpatient Rx  Name  Route  Sig  Dispense  Refill  . amLODipine (NORVASC) 5 MG tablet      TAKE 1 TABLET (5 MG TOTAL) BY MOUTH DAILY.   30 tablet   5   . b complex vitamins tablet   Oral   Take 1 tablet by mouth daily.          Marland Kitchen doxepin (SINEQUAN) 10 MG capsule      TAKE ONE CAPSULE BY MOUTH AT BEDTIME   30 capsule   5   . finasteride (PROPECIA) 1 MG tablet   Oral   Take 1 mg by mouth daily.         Marland Kitchen  finasteride (PROSCAR) 5 MG tablet      TAKE 1 TABLET EVERY DAY   30 tablet   11   . fish oil-omega-3 fatty acids 1000 MG capsule   Oral   Take 1 g by mouth daily.          . Flaxseed, Linseed, (RA FLAX SEED OIL 1000 PO)   Oral   Take 1,000 mg by mouth 3 (three) times daily.          . furosemide (LASIX) 20 MG tablet   Oral   Take 20 mg by mouth 2 (two) times daily as needed.          . Glucosamine Sulfate (GLUCOSAMINE RELIEF) 1000 MG TABS   Oral   Take 1 tablet by mouth 2 (two) times daily.          . meloxicam (MOBIC) 15 MG tablet      TAKE 1 TABLET EVERY DAY   30 tablet   5   . Milk Thistle 500 MG CAPS   Oral   Take 1 capsule by mouth 2 (two) times daily.          . niacinamide 500 MG tablet   Oral   Take 1,000 mg by mouth daily.          Marland Kitchen omeprazole (PRILOSEC) 20 MG capsule   Oral   Take 20 mg by mouth daily.         . propafenone (RYTHMOL) 225 MG tablet       TAKE 1 & 1/2 TABLET BY MOUTH TWICE A DAY   90 tablet   3     Please call to schedule a follow up visit.[phone] ...   . telmisartan (MICARDIS) 80 MG tablet   Oral   Take 80 mg by mouth daily.         Marland Kitchen telmisartan (MICARDIS) 80 MG tablet      TAKE 1 TABLET EVERY DAY   30 tablet   9   . warfarin (COUMADIN) 5 MG tablet   Oral   Take 5-7.5 mg by mouth daily. Take 5mg  daily, except take 7.5mg  Wednesdays          BP 160/91  Pulse 72  Resp 22  SpO2 92% Physical Exam  Nursing note and vitals reviewed. Constitutional: He is oriented to person, place, and time. He appears well-developed and well-nourished. No distress.  HENT:  Head: Normocephalic and atraumatic.  Mouth/Throat: Oropharynx is clear and moist.  Eyes: Conjunctivae are normal. Pupils are equal, round, and reactive to light. No scleral icterus.  Neck: Neck supple.  Cardiovascular: Normal rate, regular rhythm, normal heart sounds and intact distal pulses.   No murmur heard. Pulmonary/Chest: No stridor. Tachypnea noted. He is in respiratory distress. He has wheezes (mild). He has rales (bibasilar).  Abdominal: Soft. He exhibits no distension. There is no tenderness.  Musculoskeletal: Normal range of motion. He exhibits edema (mild).  Neurological: He is alert and oriented to person, place, and time.  Skin: Skin is warm and dry. No rash noted.  Psychiatric: He has a normal mood and affect. His behavior is normal.    ED Course   CRITICAL CARE Performed by: Blake Divine DAVID Authorized by: Blake Divine DAVID Total critical care time: 35 minutes Critical care time was exclusive of separately billable procedures and treating other patients. Critical care was necessary to treat or prevent imminent or life-threatening deterioration of the following conditions: cardiac failure and respiratory failure. Critical care was  time spent personally by me on the following activities: development of treatment plan with patient  or surrogate, discussions with consultants, evaluation of patient's response to treatment, examination of patient, obtaining history from patient or surrogate, ordering and performing treatments and interventions, ordering and review of laboratory studies, ordering and review of radiographic studies, pulse oximetry, re-evaluation of patient's condition and review of old charts.   (including critical care time)  Labs Reviewed  CBC WITH DIFFERENTIAL - Abnormal; Notable for the following:    Hemoglobin 17.1 (*)    MCH 36.0 (*)    MCHC 37.5 (*)    All other components within normal limits  COMPREHENSIVE METABOLIC PANEL - Abnormal; Notable for the following:    Sodium 122 (*)    Chloride 89 (*)    Glucose, Bld 142 (*)    AST 43 (*)    GFR calc non Af Amer 86 (*)    All other components within normal limits  PRO B NATRIURETIC PEPTIDE - Abnormal; Notable for the following:    Pro B Natriuretic peptide (BNP) 389.6 (*)    All other components within normal limits  PROTIME-INR - Abnormal; Notable for the following:    Prothrombin Time 32.4 (*)    INR 3.31 (*)    All other components within normal limits  POCT I-STAT 3, BLOOD GAS (G3+) - Abnormal; Notable for the following:    pO2, Arterial 72.0 (*)    Acid-base deficit 3.0 (*)    All other components within normal limits  TROPONIN I  CG4 I-STAT (LACTIC ACID)   Dg Chest Port 1 View  12/18/2012   *RADIOLOGY REPORT*  Clinical Data: Shortness of breath with cough for 2 hours.  History of hypertension and, atrial fibrillation and cardiomyopathy.  PORTABLE CHEST - 1 VIEW  Comparison: 03/31/2012 and 06/24/2011.  Findings: 1436 hours.  The heart size and mediastinal contours are stable.  There are new bilateral perihilar and lower lobe airspace opacities with septal lines consistent with pulmonary edema.  No significant pleural effusion is seen.  Mild lucency along the aortic arch is attributed to spared lung or small paracentral blebs; no definite  pneumomediastinum identified.  IMPRESSION: Congestive heart failure with diffuse pulmonary edema.   Original Report Authenticated By: Carey Bullocks, M.D.  All radiology studies independently viewed by me.     EKG - sinus rhythm, left axis, LBBB, negative sgarbossa, compared to prior, sinus rhythm has replaced a fib.     1. Acute on chronic diastolic CHF (congestive heart failure), NYHA class 1   2.  Acute Respiratory Failure  MDM  72 yo male with sudden onset shortness of breath.  Hypoxic on arrival.  Improved with supplemental O2 and albuterol.  On re-exam, bibasilar rales predominate.  CXR concerning for pulmonary edema.  Due to his persistent increased WOB and O2 requirement, he was given 40 mg IV lasix.  Subsequently found to be hyponatremic.  Will need admission for further workup and monitoring.  Have consulted Cardiology.    Candyce Churn, MD 12/19/12 [phone]

## 2012-12-18 NOTE — H&P (Addendum)
History and Physical  Patient ID: Travis [name] MRN: [id number], SOB: 02-05-1941 72 y.o. Date of Encounter: 12/18/2012, 4:15 PM  Primary Physician: Carrie Mew, MD Primary Cardiologist: Sherryl Manges, MD   Chief Complaint: SOB  HPI: 72 yo male, with longstanding h/o PAF, s/p DCCV 12/16/2012, by Dr Jens Som, now presents to ED with acute SOB.  Patient reports having done well since his recent procedure (reportedly his 4th in last 10 years), although he noted some mild peripheral edema yesterday. He took a 20 mg tablet of Lasix, which he takes on a prn basis. He denied any development of CP, tachy palpitations, or PND. Around 12:30 today, after he had completed a morning walk, he lay down to read and became quite dyspneic. Again, he denied any associated CP or palpitations.  On admission, he was found to have CHF on CXR, with a corresponding BNP of 390. He received 40 mg IV Lasix, with brisk diuretic response. He currently reports significant improvement.  Admission EKG indicated NSR 78 bpm with chronic LBBB.  Past Medical History  Diagnosis Date  . Arthritis   . Hypertension   . GERD (gastroesophageal reflux disease)   . Bicuspid aortic valve   . 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 Sinus bradycardia   . Paroxysmal atrial fibrillation     On Rythmol  . Cardiomyopathy -resolved     tachycardia-induced, EF 55% June 2009  . Left bundle branch block      Surgical History:  Past Surgical History  Procedure Laterality Date  . Cerebral anuersym post clips    . Rotator cuff repair      lf  . Fractured left arm    . Tonsillectomy    . Left tendon repair      lft foot  . Hernia repair      lft  . Wrist ganglion excision      lft  . Tonsillectomy    . Inguinal hernia repair  07/02/2011    Procedure: LAPAROSCOPIC INGUINAL HERNIA;  Surgeon: Shelly Rubenstein, MD;  Location: [facility] OR;  Service: General;  Laterality: Left;  Laparoscopic left inguinal hernia repair and  mesh  . Cardiac catheterization       Home Meds: Prior to Admission medications   Medication Sig Start Date End Date Taking? Authorizing Provider  amLODipine (NORVASC) 5 MG tablet Take 5 mg by mouth daily.   Yes Historical Provider, MD  b complex vitamins tablet Take 1 tablet by mouth daily.    Yes Historical Provider, MD  doxepin (SINEQUAN) 10 MG capsule Take 10 mg by mouth at bedtime.   Yes Historical Provider, MD  finasteride (PROPECIA) 1 MG tablet Take 1 mg by mouth daily.   Yes Historical Provider, MD  fish oil-omega-3 fatty acids 1000 MG capsule Take 1 g by mouth daily.    Yes Historical Provider, MD  Flaxseed, Linseed, (RA FLAX SEED OIL 1000 PO) Take 1,000 mg by mouth 3 (three) times daily.    Yes Historical Provider, MD  furosemide (LASIX) 20 MG tablet Take 20 mg by mouth 2 (two) times daily as needed for fluid.    Yes Historical Provider, MD  Glucosamine Sulfate (GLUCOSAMINE RELIEF) 1000 MG TABS Take 1 tablet by mouth 2 (two) times daily.    Yes Historical Provider, MD  meloxicam (MOBIC) 15 MG tablet Take 15 mg by mouth daily.   Yes Historical Provider, MD  Milk Thistle 500 MG CAPS Take 1 capsule by mouth 2 (  two) times daily.    Yes Historical Provider, MD  omeprazole (PRILOSEC) 20 MG capsule Take 20 mg by mouth daily.   Yes Historical Provider, MD  propafenone (RYTHMOL) 225 MG tablet Take 337.5 mg by mouth 2 (two) times daily.   Yes Historical Provider, MD  telmisartan (MICARDIS) 80 MG tablet Take 80 mg by mouth daily.   Yes Historical Provider, MD  warfarin (COUMADIN) 5 MG tablet Take 5-7.5 mg by mouth daily. Take 5mg  daily, except take 7.5mg  Wednesdays   Yes Historical Provider, MD    Allergies: No Known Allergies  History   Social History  . Marital Status: Single    Spouse Name: N/A    Number of Children: 0  . Years of Education: N/A   Occupational History  . Retired    Social History Main Topics  . Smoking status: Former Smoker -- 1.50 packs/day    Quit date:  05/26/1986  . Smokeless tobacco: Never Used  . Alcohol Use: 4.2 oz/week    7 Glasses of wine per week     Comment: 2 daily   . Drug Use: No  . Sexually Active: Yes   Other Topics Concern  . Not on file   Social History Narrative   0 caffeine drinks daily      Family History  Problem Relation Age of Onset  . Stroke Father   . Lung cancer Mother 77  . Stroke Mother   . Colon cancer Neg Hx     Review of Systems: General: negative for chills, fever, night sweats or weight changes.  Cardiovascular: negative for chest pain, edema, orthopnea, palpitations, paroxysmal nocturnal dyspnea, shortness of breath or dyspnea on exertion; denies h/o MI, CHF Dermatological: negative for rash Respiratory: negative for cough or wheezing Urologic: negative for hematuria Abdominal: negative for nausea, vomiting, diarrhea, bright red blood per rectum, melena, or hematemesis Neurologic: negative for visual changes, syncope, or dizziness; denies h/o stroke All other systems reviewed and are otherwise negative except as noted above.  Labs:   Lab Results  Component Value Date   WBC 6.9 12/18/2012   HGB 17.1* 12/18/2012   HCT 45.6 12/18/2012   MCV 96.0 12/18/2012   PLT 197 12/18/2012    Recent Labs Lab 12/18/12 1400  NA 122*  K 4.4  CL 89*  CO2 22  BUN 13  CREATININE 0.82  CALCIUM 9.1  PROT 7.3  BILITOT 0.5  ALKPHOS 109  ALT 24  AST 43*  GLUCOSE 142*    Recent Labs  12/18/12 1400  TROPONINI <0.30   Lab Results  Component Value Date   CHOL 222* 02/04/2012   HDL 76.30 02/04/2012   TRIG 36.0 02/04/2012   No results found for this basename: DDIMER    Radiology/Studies:  Dg Chest Port 1 View  12/18/2012   *RADIOLOGY REPORT*  Clinical Data: Shortness of breath with cough for 2 hours.  History of hypertension and, atrial fibrillation and cardiomyopathy.  PORTABLE CHEST - 1 VIEW  Comparison: 03/31/2012 and 06/24/2011.  Findings: 1436 hours.  The heart size and mediastinal contours  are stable.  There are new bilateral perihilar and lower lobe airspace opacities with septal lines consistent with pulmonary edema.  No significant pleural effusion is seen.  Mild lucency along the aortic arch is attributed to spared lung or small paracentral blebs; no definite pneumomediastinum identified.  IMPRESSION: Congestive heart failure with diffuse pulmonary edema.   Original Report Authenticated By: Carey Bullocks, M.D.     EKG: NSR  78 bpm; chronic LBBB  Physical Exam: Blood pressure 147/85, pulse 63, resp. rate 31, SpO2 96.00%. General: Well developed, well nourished, in no acute distress. Head: Normocephalic, atraumatic, sclera non-icteric, no xanthomas, nares are without discharge.  Neck: Negative for carotid bruits. JVD not elevated. Lungs: bibasilar crackles 1/3 up. Heart: RRR with S1 S2. No murmurs, rubs, or gallops appreciated. Abdomen: Soft, non-tender, non-distended with normoactive bowel sounds. No hepatomegaly. No rebound/guarding. No obvious abdominal masses. Msk:  Strength and tone appear normal for age. Extremities: trace peripheral edema Neuro: Alert and oriented X 3. Moves all extremities spontaneously. Psych:  Responds to questions appropriately with a normal affect.     ASSESSMENT AND PLAN:  1 Acute/chronic diastolic heart failure  - h/o tachy-mediated CM, resolved  - EF >55%, echo, 11/2011 2 PAF  - s/p recent DCCV, maintaining NSR  - Coumadin anticoagulation, therapeutic 3 LBBB  - no known CAD 4 bicuspid Aortic valve 5 HTN  PLAN:  Patient will be admitted to telemetry unit for aggessive diuretic therapy with IV Lasix. A 2D echo will be ordered, for reassessment of his LV function. Serial cardiac markers to be drawn, TSH, as well as followup labs in AM. Patient will be continued on home medication regimen, including Coumadin.  Signed, SERPE, EUGENE PA-C 12/18/2012, 4:15 PM Very pleasant elderly gentleman seen in ER with Mr. Shara Blazing, [place].  Patient had done  well since his cardioversion 2 days ago. Today sudden onset of orthopnea without chest pain. His last echo was in July 2013 and demonstrated Grade 2 diastolic dysfunction and mild pulmonary hypertension. Exam in ER shows bilateral wet rales but otherwise unremarkable. I personally reviewed chest xray and EKG. Working diagnosis is acute on chronic diastolic CHF. Plan will be to re-institute regular small dose of lasix at discharge. Will update his echo. Assuming normal troponins he may be able to be discharged tomorrow afternoon.

## 2012-12-18 NOTE — ED Notes (Signed)
Pt c/o sudden onset SOB 30 minutes ago. Pt thought it was anxiety so he took a xanax PTA, which did not relieve the SOB so a friend brought him to ED. Pt denies any pain. Pt is able to speak in short phrases, pt with labored breathing.

## 2012-12-19 ENCOUNTER — Observation Stay (HOSPITAL_COMMUNITY): Payer: Medicare Other

## 2012-12-19 DIAGNOSIS — J9600 Acute respiratory failure, unspecified whether with hypoxia or hypercapnia: Secondary | ICD-10-CM | POA: Diagnosis not present

## 2012-12-19 DIAGNOSIS — I5033 Acute on chronic diastolic (congestive) heart failure: Secondary | ICD-10-CM | POA: Diagnosis not present

## 2012-12-19 DIAGNOSIS — I428 Other cardiomyopathies: Secondary | ICD-10-CM | POA: Diagnosis not present

## 2012-12-19 DIAGNOSIS — I509 Heart failure, unspecified: Secondary | ICD-10-CM | POA: Diagnosis not present

## 2012-12-19 DIAGNOSIS — Q231 Congenital insufficiency of aortic valve: Secondary | ICD-10-CM | POA: Diagnosis not present

## 2012-12-19 LAB — BASIC METABOLIC PANEL
Calcium: 9 mg/dL (ref 8.4–10.5)
GFR calc Af Amer: >90 mL/min (ref >90)
GFR calc non Af Amer: 88 mL/min — ABNORMAL LOW (ref >90)
Glucose, Bld: 129 mg/dL — ABNORMAL HIGH (ref 70–99)
Potassium: 4.1 mEq/L (ref 3.5–5.1)
Sodium: 124 mEq/L — ABNORMAL LOW (ref 135–145)

## 2012-12-19 LAB — TROPONIN I
Troponin I: <0.3 ng/mL (ref <0.30)
Troponin I: <0.3 ng/mL (ref <0.30)

## 2012-12-19 LAB — PROTIME-INR
INR: 2.03 — ABNORMAL HIGH (ref 0.00–1.49)
Prothrombin Time: 22.3 seconds — ABNORMAL HIGH (ref 11.6–15.2)

## 2012-12-19 MED ORDER — FUROSEMIDE 20 MG PO TABS
20.0000 mg | ORAL_TABLET | Freq: Every day | ORAL | Status: DC
  Administered 2012-12-20 – 2012-12-24 (×5): 20 mg via ORAL
  Filled 2012-12-19 (×5): qty 1

## 2012-12-19 MED ORDER — FUROSEMIDE 10 MG/ML IJ SOLN
40.0000 mg | Freq: Every day | INTRAMUSCULAR | Status: DC
  Administered 2012-12-19: 40 mg via INTRAVENOUS

## 2012-12-19 NOTE — Progress Notes (Signed)
Pt weaned off of O2 with sats maintained at about 94%. Pt up ambulating in hallway with no resp distress.

## 2012-12-19 NOTE — Progress Notes (Signed)
Patient: Travis [name] Date of Encounter: 12/19/2012, 9:29 AM Admit date: 12/18/2012     Subjective  Feels much better, much less SOB. No PND last night. Echo not yet done.   Objective   Telemetry: NSR Physical Exam: Filed Vitals:   12/19/12 0628  BP: 110/78  Pulse: 70  Temp: 97.5 F (36.4 C)  Resp: 21   General: Well developed, well nourished, in no acute distress. Head: Normocephalic, atraumatic, sclera non-icteric, no xanthomas, nares are without discharge.  Neck: JVD elevated on R, at 90 degrees. Lungs: mild late crackles, no wheezes. Heart: RRR S1 S2; II/VI systolic ejection murmur.  Abdomen: Soft, non-tender, non-distended with normoactive bowel sounds. No hepatomegaly. No rebound/guarding. No obvious abdominal masses. Msk:  Strength and tone appear normal for age. Extremities: No edema.   Neuro: Alert and oriented X 3. Moves all extremities spontaneously. Psych:  Responds to questions appropriately with a normal affect.    Intake/Output Summary (Last 24 hours) at 12/19/12 0929 Last data filed at 12/19/12 0700  Gross per 24 hour  Intake      0 ml  Output   3675 ml  Net  -3675 ml    Inpatient Medications:  . amLODipine  5 mg Oral Daily  . doxepin  10 mg Oral QHS  . [START ON 12/20/2012] furosemide  40 mg Intravenous Daily  . irbesartan  75 mg Oral Daily  . pantoprazole  40 mg Oral Daily  . propafenone  337.5 mg Oral BID  . sodium chloride  3 mL Intravenous Q12H  . warfarin  5 mg Oral Custom  . [START ON 12/22/2012] warfarin  7.5 mg Oral Custom  . Warfarin - Physician Dosing Inpatient   Does not apply q1800    Labs:  Recent Labs  12/18/12 1400 12/19/12 0750  NA 122* 124*  K 4.4 4.1  CL 89* 87*  CO2 22 27  GLUCOSE 142* 129*  BUN 13 11  CREATININE 0.82 0.79  CALCIUM 9.1 9.0    Recent Labs  12/18/12 1400  AST 43*  ALT 24  ALKPHOS 109  BILITOT 0.5  PROT 7.3  ALBUMIN 3.8     Recent Labs  12/18/12 1400  WBC 6.9  NEUTROABS 4.0  HGB  17.1*  HCT 45.6  MCV 96.0  PLT 197    Recent Labs  12/18/12 1400 12/18/12 1945 12/19/12 0055 12/19/12 0750  TROPONINI <0.30 <0.30 <0.30 <0.30    Radiology/Studies:  Dg Chest Port 1 View  12/18/2012   *RADIOLOGY REPORT*  Clinical Data: Shortness of breath with cough for 2 hours.  History of hypertension and, atrial fibrillation and cardiomyopathy.  PORTABLE CHEST - 1 VIEW  Comparison: 03/31/2012 and 06/24/2011.  Findings: 1436 hours.  The heart size and mediastinal contours are stable.  There are new bilateral perihilar and lower lobe airspace opacities with septal lines consistent with pulmonary edema.  No significant pleural effusion is seen.  Mild lucency along the aortic arch is attributed to spared lung or small paracentral blebs; no definite pneumomediastinum identified.  IMPRESSION: Congestive heart failure with diffuse pulmonary edema.   Original Report Authenticated By: Carey Bullocks, M.D.     Assessment and Plan   1 Acute/chronic diastolic heart failure   - h/o tachy-mediated CM, resolved   - EF >55%, echo, 11/2011   2 PAF   - s/p recent DCCV, maintaining NSR   - Coumadin anticoagulation, therapeutic   3 LBBB   - no known CAD   4 Bicuspid  Aortic valve   5 HTN  PLAN: Good response to lasix.  Serum sodium has been chronically low.  He has seen specialists about this in the past. He salts his food liberally at home. Serum sodium has improved 122 up to 124 today after diuresis. Will decrease IV lasix to once a day today and switch to low dose po lasix tomorrow. 2D echo pending. Will repeat chest xray today. Troponins negative. Hopefully home Monday.

## 2012-12-20 ENCOUNTER — Encounter (HOSPITAL_COMMUNITY): Payer: Self-pay | Admitting: Cardiology

## 2012-12-20 DIAGNOSIS — I359 Nonrheumatic aortic valve disorder, unspecified: Secondary | ICD-10-CM

## 2012-12-20 DIAGNOSIS — J9600 Acute respiratory failure, unspecified whether with hypoxia or hypercapnia: Secondary | ICD-10-CM | POA: Diagnosis not present

## 2012-12-20 DIAGNOSIS — I5033 Acute on chronic diastolic (congestive) heart failure: Secondary | ICD-10-CM | POA: Diagnosis not present

## 2012-12-20 DIAGNOSIS — I509 Heart failure, unspecified: Secondary | ICD-10-CM | POA: Diagnosis not present

## 2012-12-20 DIAGNOSIS — I428 Other cardiomyopathies: Secondary | ICD-10-CM | POA: Diagnosis not present

## 2012-12-20 DIAGNOSIS — Q231 Congenital insufficiency of aortic valve: Secondary | ICD-10-CM | POA: Diagnosis not present

## 2012-12-20 LAB — PRO B NATRIURETIC PEPTIDE: Pro B Natriuretic peptide (BNP): 254.8 pg/mL — ABNORMAL HIGH (ref 0–125)

## 2012-12-20 LAB — BASIC METABOLIC PANEL
BUN: 16 mg/dL (ref 6–23)
GFR calc non Af Amer: 85 mL/min — ABNORMAL LOW (ref >90)
Glucose, Bld: 132 mg/dL — ABNORMAL HIGH (ref 70–99)
Potassium: 4 mEq/L (ref 3.5–5.1)

## 2012-12-20 MED ORDER — DOFETILIDE 500 MCG PO CAPS: 500.0000 ug | ORAL_CAPSULE | Freq: Two times a day (BID) | ORAL | Status: DC

## 2012-12-20 MED ORDER — METOPROLOL TARTRATE 25 MG PO TABS
25.0000 mg | ORAL_TABLET | Freq: Once | ORAL | Status: AC
  Administered 2012-12-20: 25 mg via ORAL
  Filled 2012-12-20: qty 1

## 2012-12-20 MED ORDER — METOPROLOL TARTRATE 25 MG PO TABS
25.0000 mg | ORAL_TABLET | Freq: Three times a day (TID) | ORAL | Status: DC
  Administered 2012-12-20 – 2012-12-24 (×12): 25 mg via ORAL
  Filled 2012-12-20 (×14): qty 1

## 2012-12-20 NOTE — Progress Notes (Signed)
Paged to Dr Ladona Ridgel again.

## 2012-12-20 NOTE — Progress Notes (Signed)
Paged DR Lewayne Bunting to make aware that pt had received Rythmol po this am . MD has ordered Tikosyn po and pharmacy will not dispense on the same day due to contraindications.

## 2012-12-20 NOTE — Progress Notes (Signed)
Patient evaluated for community based chronic disease management services with [facility] Care Management Program as a benefit of patient's [employer].  Spoke with patient at bedside to explain [facility] Care Management services.  He indicated that he does live alone but has a strong network of friends.  He formally declines services at this time.   Left contact information and [facility] literature with patient. Made inpatient Case Manager aware that [facility] Care Management following. Of note, [facility] Care Management services does not replace or interfere with any services that are arranged by inpatient case management or social work.  For additional questions or referrals please contact Anibal Henderson BSN RN [facility] Liaison at [phone].

## 2012-12-20 NOTE — Progress Notes (Addendum)
Patient ID: Travis [name], male   DOB: 09-24-1940, 72 y.o.   MRN: [id number] Subjective:  Dyspnea improved, still with palpitations  Objective:  Vital Signs in the last 24 hours: Temp:  [97.1 F (36.2 C)-97.9 F (36.6 C)] 97.9 F (36.6 C) (07/28 0508) Pulse Rate:  [61-116] 107 (07/28 0508) Resp:  [18-21] 19 (07/28 0508) BP: (114-124)/(68-96) 123/96 mmHg (07/28 0508) SpO2:  [91 %-97 %] 96 % (07/28 0508) Weight:  [184 lb 9.6 oz (83.734 kg)] 184 lb 9.6 oz (83.734 kg) (07/28 0508)  Intake/Output from previous day: 07/27 0701 - 07/28 0700 In: 440 [P.O.:440] Out: 1350 [Urine:1350] Intake/Output from this shift: Total I/O In: -  Out: 420 [Urine:420]  Physical Exam: Well appearing NAD HEENT: Unremarkable Neck:  No JVD, no thyromegally Lungs:  Clear except for scant basilar rales. HEART:  IRegular rate rhythm, no murmurs, no rubs, no clicks Abd:  soft, positive bowel sounds, no organomegally, no rebound, no guarding Ext:  2 plus pulses, no edema, no cyanosis, no clubbing Skin:  No rashes no nodules Neuro:  CN II through XII intact, motor grossly intact  Lab Results:  Recent Labs  12/18/12 1400  WBC 6.9  HGB 17.1*  PLT 197    Recent Labs  12/19/12 0750 12/20/12 0412  NA 124* 127*  K 4.1 4.0  CL 87* 91*  CO2 27 29  GLUCOSE 129* 132*  BUN 11 16  CREATININE 0.79 0.86    Recent Labs  12/19/12 0055 12/19/12 0750  TROPONINI <0.30 <0.30   Hepatic Function Panel  Recent Labs  12/18/12 1400  PROT 7.3  ALBUMIN 3.8  AST 43*  ALT 24  ALKPHOS 109  BILITOT 0.5   No results found for this basename: CHOL,  in the last 72 hours No results found for this basename: PROTIME,  in the last 72 hours  Imaging: Dg Chest 2 View  12/19/2012   *RADIOLOGY REPORT*  Clinical Data: Congestive heart failure.  Cough.  CHEST - 2 VIEW  Comparison: 12/18/2012 03/31/2012  Findings: The interstitial pulmonary edema has almost completely resolved.  Tiny residual effusions.  Heart size  and pulmonary vascularity are normal.  No acute osseous abnormality.  IMPRESSION: Almost complete resolution of congestive heart failure.   Original Report Authenticated By: Francene Boyers, M.D.   Dg Chest Port 1 View  12/18/2012   *RADIOLOGY REPORT*  Clinical Data: Shortness of breath with cough for 2 hours.  History of hypertension and, atrial fibrillation and cardiomyopathy.  PORTABLE CHEST - 1 VIEW  Comparison: 03/31/2012 and 06/24/2011.  Findings: 1436 hours.  The heart size and mediastinal contours are stable.  There are new bilateral perihilar and lower lobe airspace opacities with septal lines consistent with pulmonary edema.  No significant pleural effusion is seen.  Mild lucency along the aortic arch is attributed to spared lung or small paracentral blebs; no definite pneumomediastinum identified.  IMPRESSION: Congestive heart failure with diffuse pulmonary edema.   Original Report Authenticated By: Carey Bullocks, M.D.    Cardiac Studies: Tele - atrial fib with a RVR/CVR Assessment/Plan:  1. Acute diastolic heart failure, awaiting echo 2. Persistent atrial fib, s/p DCCV several days ago, now with ERAF 3. HTN 4. Probable ETOH excess Rec: await 2D echo. I doubt he would be a good candidate for flecainide or rhythmol. Will consider Tikosyn based on the results of his echo. His QT is prolonged slightly but in the setting of an IVCD and a prolonged QRS duration.   LOS: 2  days    Sparsh Callens,M.D. 12/20/2012, 10:04 AM   Discussed with Janora Norlander, PA-C. Patient actually on Rhythmol. With CHF, Rhythmol not an ideal anti-arrhythmic drug despite likely diastolic CHF. Will plan to uptitrate his beta blocker, hopefully we can control his ventricular rate and then discharge with outpatient Tikosyn. If his ventricular rate is not well controlled, then will initiate Tikosyn on Wednesday and plan for discharge on Friday or Saturday. Hopefully QRS duration will lessen off of Rhythmol.  Leonia Reeves.D.

## 2012-12-20 NOTE — Progress Notes (Signed)
Pt had increase of HR 120-130's no S/S, did an ECG it showed Afib with RVR, called MD, they ordered Metoprolol 25 mg 1 x dose, will continue to monitor, Thanks, Lavonda Jumbo RN

## 2012-12-20 NOTE — Progress Notes (Signed)
Pharmacy Consult for Dofetilide (Tikosyn) Iniation  Admit Complaint: 72 y.o. male admitted 12/18/2012 with atrial fibrillation to be initiated on dofetilide.   Assessment:  Patient Exclusion Criteria: If any screening criteria checked as "Yes", then  patient  should NOT receive dofetilide until criteria item is corrected. If "Yes" please indicate correction plan.  YES  NO Patient  Exclusion Criteria Correction Plan  [x]  []  Baseline QTc interval is greater than or equal to 440 msec. IF above YES box checked dofetilide contraindicated unless patient has ICD; then may proceed if QTc 500-550 msec or with known ventricular conduction abnormalities may proceed with QTc 550-600 msec. QTc = 0.42 QTc prolonged slightly, but in the setting of IVCD and prolonged QRS duration. Dr. Ladona Ridgel plans to proceed.   []  []  Magnesium level is less than 1.8 mEq/l : Last magnesium:  Lab Results  Component Value Date   MG 2.1 06/21/2009       Magnesium level ordered stat.  []  [x]  Potassium level is less than 4 mEq/l : Last potassium:  Lab Results  Component Value Date   K 4.0 12/20/2012         []  [x]  Patient is known or suspected to have a digoxin level greater than 2 ng/ml: No results found for this basename: DIGOXIN      []  [x]  Creatinine clearance less than 20 ml/min (calculated using Cockcroft-Gault, actual body weight and serum creatinine): Estimated Creatinine Clearance: 80.3 ml/min (by C-G formula based on Cr of 0.86).    []  [x]  Patient has received drugs known to prolong the QT intervals within the last 48 hours(phenothiazines, tricyclics or tetracyclic antidepressants, erythromycin, H-1 antihistamines, cisapride, fluoroquinolones, azithromycin). Drugs not listed above may have an, as yet, undetected potential to prolong the QT interval, updated information on QT prolonging agents is available at this website:QT prolonging agents   []  [x]  Patient received a dose of hydrochlorothiazide (Oretic) alone  or in any combination including triamterene (Dyazide, Maxzide) in the last 48 hours.   []  [x]  Patient received a medication known to increase dofetilide plasma concentrations prior to initial dofetilide dose:    Trimethoprim (Primsol, Proloprim) in the last 36 hours   Verapamil (Calan, Verelan) in the last 36 hours or a sustained release dose in the last 72 hours   Megestrol (Megace) in the last 5 days    Cimetidine (Tagamet) in the last 6 hours   Ketoconazole (Nizoral) in the last 24 hours   Itraconazole (Sporanox) in the last 48 hours    Prochlorperazine (Compazine) in the last 36 hours    []  [x]  Patient is known to have a history of torsades de pointes; congenital or acquired long QT syndromes.   [x]  []  Patient has received a Class 1 antiarrhythmic with less than 2 half-lives since last dose. (Disopyramide, Quinidine, Procainamide, Lidocaine, Mexiletine, Flecainide, Propafenone) Propafenone dosed this morning. Elimination half-life estimated at 10 hr.  Will discontinue and dose tikosyn when appropriate.  []  [x]  Patient has received amiodarone therapy in the past 3 months or amiodarone level is greater than 0.3 ng/ml.    Patient on home Coumadin and has had therapeutic INRs on this regimen.  Home dose of Coumadin resumed inpatient, but INR not therapeutic today.    Ordering provider was confirmed at [email or web address] if they are not listed on the [facility] Authorized Prescribers list.  Goal of Therapy:  Follow renal function, electrolytes, potential drug interactions, and dose adjustment. Provide education and 1 week supply at discharge.  Plan:  1.  Initiate dofetilide, no earlier than 1000 tomorrow due to propafenone use this AM, based on renal function: [employer] Calculated CrCl  Dose q12h  [x]  > 60 ml/min 500 mcg  []  40-60 ml/min 250 mcg  []  20-40 ml/min 125 mcg   2. Follow up QTc after the first 5 doses, renal function, electrolytes (K & Mg) daily x 3     days, dose adjustment,  success of initiation and facilitate 1 week discharge supply as     clinically indicated.  3. Initiate Tikosyn education video (Call [phone] and ask for video # [phone]).  4. Place Enrollment Form on the chart for discharge supply of dofetilide.   Thank you, Piedad Climes, PharmD Clinical Pharmacist - Resident Pager: [phone] Pharmacy: [phone] 12/20/2012 11:33 AM

## 2012-12-20 NOTE — Progress Notes (Signed)
Paged [place] PA [employer] returned page notified her of pt and antiarrythmic meds. Orders received. Pt aware that we cannot start Tikosyn today.

## 2012-12-20 NOTE — Progress Notes (Signed)
  Echocardiogram 2D Echocardiogram has been performed.  Cathie Beams 12/20/2012, 1:35 PM

## 2012-12-21 DIAGNOSIS — I5033 Acute on chronic diastolic (congestive) heart failure: Secondary | ICD-10-CM | POA: Diagnosis not present

## 2012-12-21 DIAGNOSIS — I428 Other cardiomyopathies: Secondary | ICD-10-CM | POA: Diagnosis not present

## 2012-12-21 DIAGNOSIS — J9600 Acute respiratory failure, unspecified whether with hypoxia or hypercapnia: Secondary | ICD-10-CM | POA: Diagnosis not present

## 2012-12-21 DIAGNOSIS — Q231 Congenital insufficiency of aortic valve: Secondary | ICD-10-CM | POA: Diagnosis not present

## 2012-12-21 DIAGNOSIS — I509 Heart failure, unspecified: Secondary | ICD-10-CM | POA: Diagnosis not present

## 2012-12-21 LAB — BASIC METABOLIC PANEL
BUN: 14 mg/dL (ref 6–23)
Chloride: 93 mEq/L — ABNORMAL LOW (ref 96–112)
GFR calc Af Amer: >90 mL/min (ref >90)
Potassium: 3.8 mEq/L (ref 3.5–5.1)
Sodium: 128 mEq/L — ABNORMAL LOW (ref 135–145)

## 2012-12-21 LAB — PROTIME-INR
INR: 1.44 (ref 0.00–1.49)
Prothrombin Time: 17.2 seconds — ABNORMAL HIGH (ref 11.6–15.2)

## 2012-12-21 MED ORDER — ENOXAPARIN SODIUM 80 MG/0.8ML ~~LOC~~ SOLN
80.0000 mg | Freq: Two times a day (BID) | SUBCUTANEOUS | Status: DC
  Administered 2012-12-21 – 2012-12-24 (×7): 80 mg via SUBCUTANEOUS
  Filled 2012-12-21 (×11): qty 0.8

## 2012-12-21 MED ORDER — POTASSIUM CHLORIDE CRYS ER 20 MEQ PO TBCR
20.0000 meq | EXTENDED_RELEASE_TABLET | Freq: Once | ORAL | Status: AC
  Administered 2012-12-21: 20 meq via ORAL
  Filled 2012-12-21: qty 1

## 2012-12-21 MED ORDER — WARFARIN - PHARMACIST DOSING INPATIENT: Freq: Every day | Status: DC

## 2012-12-21 MED ORDER — WARFARIN SODIUM 7.5 MG PO TABS
7.5000 mg | ORAL_TABLET | Freq: Once | ORAL | Status: AC
  Administered 2012-12-21: 7.5 mg via ORAL
  Filled 2012-12-21: qty 1

## 2012-12-21 MED ORDER — DOCUSATE SODIUM 100 MG PO CAPS
100.0000 mg | ORAL_CAPSULE | Freq: Two times a day (BID) | ORAL | Status: DC | PRN
  Administered 2012-12-21 (×2): 100 mg via ORAL
  Filled 2012-12-21 (×2): qty 1

## 2012-12-21 NOTE — Progress Notes (Addendum)
ANTICOAGULATION CONSULT NOTE - Initial Consult  Pharmacy Consult for Enoxaparin Indication: atrial fibrillation  No Known Allergies  Patient Measurements: Height: 5\' 7"  (170.2 cm) Weight: 184 lb (83.462 kg) (Scale A) IBW/kg (Calculated) : 66.1   Vital Signs: Temp: 97.5 F (36.4 C) (07/29 0557) Temp src: Oral (07/29 0557) BP: 115/80 mmHg (07/29 1012) Pulse Rate: 88 (07/29 1012)  Labs:  Recent Labs  12/18/12 1400 12/18/12 1945 12/19/12 0055 12/19/12 0750 12/20/12 0412 12/21/12 0800  HGB 17.1*  --   --   --   --   --   HCT 45.6  --   --   --   --   --   PLT 197  --   --   --   --   --   LABPROT 32.4*  --   --  22.3* 20.0*  --   INR 3.31*  --   --  2.03* 1.76*  --   CREATININE 0.82  --   --  0.79 0.86 0.77  TROPONINI <0.30 <0.30 <0.30 <0.30  --   --     Estimated Creatinine Clearance: 86.3 ml/min (by C-G formula based on Cr of 0.77).   Medical History: Past Medical History  Diagnosis Date  . Arthritis   . Hypertension   . GERD (gastroesophageal reflux disease)   . Bicuspid aortic valve   . 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 Sinus bradycardia   . Paroxysmal atrial fibrillation     On Rythmol  . Cardiomyopathy -resolved     tachycardia-induced, EF 55% June 2009  . Left bundle branch block   . History of cardioversion 12/16/2012      Assessment: 71yom admitted with HF exacerbation and duiresed 5L with iv furosemide.  Pt on Coumadin PTA for AFIB - on Rhythmol PTA s/p DCCV - but now back in AFIB with RVR.  Plan to initiate Tikosyn 7/30 after Rhythmol wash out.   INR fell 1.76 however has been > 2 x1 yr - will begin enoxaparin until INR >2 for planned Tikosyn.  Wt 83kg, Cr 0.7 with CrCl 80 ml/min, no bleeding noted las CBC stable.  Goal of Therapy:  INR 2-3 Anti-Xa level 0.6-1.2 units/ml 4hrs after LMWH dose given Monitor platelets by anticoagulation protocol: Yes   Plan:  Coumadin per MD - Continue home dose 5mg  daily with 7.5mg  on  WED enoxaparin 80mg  q12 until INR >2 Daily protimes BMET and Mag with AML in preparation of Tikosyn  Leota Sauers Pharm.D. CPP, BCPS Clinical Pharmacist [phone] 12/21/2012 11:06 AM    Pm addendum INR 1.44 today spoke to [facility] - Pharmacy to dose Coumadin Coumadin 7.5mg  x1 tonight  Leota Sauers Pharm.D. CPP, BCPS Clinical Pharmacist [phone] 12/21/2012 2:52 PM

## 2012-12-21 NOTE — Plan of Care (Signed)
Problem: Phase I Progression Outcomes Goal: EF % per last Echo/documented,Core Reminder form on chart Outcome: Completed/Met Date Met:  12/21/12 12/20/12 EF 30 to 35 %

## 2012-12-21 NOTE — Progress Notes (Signed)
ELECTROPHYSIOLOGY ROUNDING NOTE    Patient Name: Travis [name] Date of Encounter: 12/21/2012    SUBJECTIVE:Patient feels well today.  No chest pain, shortness of breath improved.  Rhythmol discontinued yesterday.  Plan for Tikosyn initiation tomorrow after wash out.  Echo yesterday demonstrated EF 30-35%, LA 47.  INR subtherapeutic today at 1.76-- first subtherapeutic reading since April of this year.   TELEMETRY: Reviewed telemetry pt in atrial fibrillation, rates 80-110's Filed Vitals:   12/20/12 1008 12/20/12 1358 12/20/12 2200 12/21/12 0557  BP: 120/87 116/83 109/82 117/83  Pulse:  107 83 78  Temp:  97.4 F (36.3 C) 97.7 F (36.5 C) 97.5 F (36.4 C)  TempSrc:  Oral Axillary Oral  Resp:  19 18 19   Height:      Weight:    184 lb (83.462 kg)  SpO2:  94% 100% 100%    Intake/Output Summary (Last 24 hours) at 12/21/12 0721 Last data filed at 12/21/12 0600  Gross per 24 hour  Intake   1180 ml  Output   1870 ml  Net   -690 ml    CURRENT MEDICATIONS: . amLODipine  5 mg Oral Daily  . doxepin  10 mg Oral QHS  . furosemide  20 mg Oral Daily  . irbesartan  75 mg Oral Daily  . metoprolol tartrate  25 mg Oral TID  . pantoprazole  40 mg Oral Daily  . sodium chloride  3 mL Intravenous Q12H  . warfarin  5 mg Oral Custom  . [START ON 12/22/2012] warfarin  7.5 mg Oral Custom  . Warfarin - Physician Dosing Inpatient   Does not apply q1800    LABS: Basic Metabolic Panel:  Recent Labs  40/98/11 0750 12/20/12 0412 12/20/12 1110  NA 124* 127*  --   K 4.1 4.0  --   CL 87* 91*  --   CO2 27 29  --   GLUCOSE 129* 132*  --   BUN 11 16  --   CREATININE 0.79 0.86  --   CALCIUM 9.0 9.1  --   MG  --   --  2.2   Liver Function Tests:  Recent Labs  12/18/12 1400  AST 43*  ALT 24  ALKPHOS 109  BILITOT 0.5  PROT 7.3  ALBUMIN 3.8   CBC:  Recent Labs  12/18/12 1400  WBC 6.9  NEUTROABS 4.0  HGB 17.1*  HCT 45.6  MCV 96.0  PLT 197   Cardiac Enzymes:  Recent  Labs  12/18/12 1945 12/19/12 0055 12/19/12 0750  TROPONINI <0.30 <0.30 <0.30  Thyroid Function Tests:  Recent Labs  12/18/12 1945  TSH 3.674   INR: 1.76  Radiology/Studies:  Dg Chest 2 View 12/19/2012   *RADIOLOGY REPORT*  Clinical Data: Congestive heart failure.  Cough.  CHEST - 2 VIEW  Comparison: 12/18/2012 03/31/2012  Findings: The interstitial pulmonary edema has almost completely resolved.  Tiny residual effusions.  Heart size and pulmonary vascularity are normal.  No acute osseous abnormality.  IMPRESSION: Almost complete resolution of congestive heart failure.   Original Report Authenticated By: Francene Boyers, M.D.   PHYSICAL EXAM Well appearing NAD HEENT: Unremarkable Back:  No CVA tenderness Lungs:  Clear with no wheezes HEART:  IRegular rate rhythm, no murmurs, no rubs, no clicks Abd:  soft, positive bowel sounds, no organomegally, no rebound, no guarding Ext:  2 plus pulses, no edema, no cyanosis, no clubbing Skin:  No rashes no nodules Neuro:  CN II through XII intact, motor  grossly intact  A/P 1. Paroxysmal atrial fib 2. Probable tachy induced CM 3. IVCD 4. HTN 5. Acute systolic CHF Plan: Rhythmol has been stopped and will plan to start Tikosyn tomorrow. His atrial fib rates appear to be well controlled.   Leonia Reeves.D.

## 2012-12-21 NOTE — Progress Notes (Signed)
Pt ambulating in hallway today. Heart rate maintaining less than 110 while ambulating - still in At Fib

## 2012-12-21 NOTE — Clinical Documentation Improvement (Signed)
THIS DOCUMENT IS NOT A PERMANENT PART OF THE MEDICAL RECORD  Please update your documentation with the medical record to reflect your response to this query. If you need help knowing how to do this please call [phone].  12/21/12  Dr. Sharrell Ku,  In a better effort to capture your patient's severity of illness, reflect appropriate length of stay and utilization of resources, a review of the patient medical record has revealed the following indicators:  ED Physician documented: "Acute Respiratory Failure" requiring 100% NRB mask and Albuterol.  Respiratory Rate in ED low to mid 20's.  Noted to be "hypoxic" on admission.  ED Physician also documented "hyponatremic".      Based on your clinical judgment, please document in the progress notes and discharge summary if you agree with the diagnosis of:   - Acute Respiratory Failure, resolved  And   - Hyponatremia, improved  in the progress notes and discharge summary.   In responding to this query please exercise your independent judgment.    The fact that a query is asked, does not imply that any particular answer is desired or expected.     Reviewed: 12/27/12 - acute resp failure and hyponatremia documented in dc summary by [employer] PA.  Mathis Dad RN  Thank You,  Jerral Ralph  Clinical Documentation Specialist: [phone] Health Information Management [place]

## 2012-12-21 NOTE — Progress Notes (Signed)
Utilization Review Completed.   Audreanna Torrisi, RN, BSN Nurse Case Manager  [phone]  

## 2012-12-22 DIAGNOSIS — I428 Other cardiomyopathies: Secondary | ICD-10-CM | POA: Diagnosis not present

## 2012-12-22 DIAGNOSIS — Q231 Congenital insufficiency of aortic valve: Secondary | ICD-10-CM | POA: Diagnosis not present

## 2012-12-22 DIAGNOSIS — J9600 Acute respiratory failure, unspecified whether with hypoxia or hypercapnia: Secondary | ICD-10-CM | POA: Diagnosis not present

## 2012-12-22 DIAGNOSIS — I5033 Acute on chronic diastolic (congestive) heart failure: Secondary | ICD-10-CM | POA: Diagnosis not present

## 2012-12-22 DIAGNOSIS — I509 Heart failure, unspecified: Secondary | ICD-10-CM | POA: Diagnosis not present

## 2012-12-22 LAB — PROTIME-INR
INR: 1.54 — ABNORMAL HIGH (ref 0.00–1.49)
Prothrombin Time: 18.1 seconds — ABNORMAL HIGH (ref 11.6–15.2)

## 2012-12-22 LAB — BASIC METABOLIC PANEL
BUN: 18 mg/dL (ref 6–23)
Calcium: 9.1 mg/dL (ref 8.4–10.5)
Chloride: 93 mEq/L — ABNORMAL LOW (ref 96–112)
Chloride: 94 mEq/L — ABNORMAL LOW (ref 96–112)
Creatinine, Ser: 0.84 mg/dL (ref 0.50–1.35)
Creatinine, Ser: 0.78 mg/dL (ref 0.50–1.35)
GFR calc Af Amer: >90 mL/min (ref >90)
GFR calc Af Amer: >90 mL/min (ref >90)
GFR calc non Af Amer: 85 mL/min — ABNORMAL LOW (ref >90)

## 2012-12-22 LAB — MAGNESIUM: Magnesium: 2.1 mg/dL (ref 1.5–2.5)

## 2012-12-22 MED ORDER — SODIUM CHLORIDE 0.9 % IJ SOLN: 3.0000 mL | INTRAMUSCULAR | Status: DC | PRN

## 2012-12-22 MED ORDER — SODIUM CHLORIDE 0.9 % IJ SOLN
3.0000 mL | Freq: Two times a day (BID) | INTRAMUSCULAR | Status: DC
  Administered 2012-12-22 – 2012-12-23 (×3): 3 mL via INTRAVENOUS

## 2012-12-22 MED ORDER — DOXEPIN HCL 10 MG PO CAPS
10.0000 mg | ORAL_CAPSULE | Freq: Every day | ORAL | Status: DC
  Administered 2012-12-22: 10 mg via ORAL
  Filled 2012-12-22 (×2): qty 1

## 2012-12-22 MED ORDER — DOFETILIDE 500 MCG PO CAPS
500.0000 ug | ORAL_CAPSULE | Freq: Two times a day (BID) | ORAL | Status: DC
  Administered 2012-12-22 – 2012-12-24 (×5): 500 ug via ORAL
  Filled 2012-12-22 (×6): qty 1

## 2012-12-22 MED ORDER — WARFARIN SODIUM 7.5 MG PO TABS
7.5000 mg | ORAL_TABLET | Freq: Once | ORAL | Status: AC
  Administered 2012-12-22: 7.5 mg via ORAL
  Filled 2012-12-22: qty 1

## 2012-12-22 MED ORDER — SODIUM CHLORIDE 0.9 % IV SOLN: 250.0000 mL | INTRAVENOUS | Status: DC | PRN

## 2012-12-22 MED ORDER — POTASSIUM CHLORIDE CRYS ER 20 MEQ PO TBCR
20.0000 meq | EXTENDED_RELEASE_TABLET | ORAL | Status: AC
  Administered 2012-12-22: 20 meq via ORAL
  Filled 2012-12-22: qty 1

## 2012-12-22 NOTE — Progress Notes (Signed)
Patient ID: TABITHA TUPPER, male   DOB: 08-24-1940, 72 y.o.   MRN: [id number]   ELECTROPHYSIOLOGY ROUNDING NOTE    Patient Name: Travis [name] Date of Encounter: 12/22/2012    SUBJECTIVE:Patient feels well today.  No chest pain, shortness of breath improved.  Rhythmol discontinued 2 days ago.  Plan for Tikosyn initiation to start today.  Echo yesterday demonstrated EF 30-35%, LA 47.  INR subtherapeutic yesterday at 1.76-- first subtherapeutic reading since April of this year.   TELEMETRY: Reviewed telemetry pt in atrial fibrillation, rates 80-110's Filed Vitals:   12/21/12 1012 12/21/12 1300 12/21/12 2100 12/22/12 0511  BP: 115/80 99/71 113/80 113/68  Pulse: 88 80 88 83  Temp:  97.9 F (36.6 C) 97.4 F (36.3 C) 97.5 F (36.4 C)  TempSrc:  Oral Axillary Oral  Resp:  18 20 20   Height:      Weight:    183 lb 14.4 oz (83.416 kg)  SpO2:  100% 95% 97%    Intake/Output Summary (Last 24 hours) at 12/22/12 0817 Last data filed at 12/21/12 1845  Gross per 24 hour  Intake    650 ml  Output   1025 ml  Net   -375 ml    CURRENT MEDICATIONS: . amLODipine  5 mg Oral Daily  . dofetilide  500 mcg Oral Q12H  . doxepin  10 mg Oral QHS  . enoxaparin (LOVENOX) injection  80 mg Subcutaneous Q12H  . furosemide  20 mg Oral Daily  . irbesartan  75 mg Oral Daily  . metoprolol tartrate  25 mg Oral TID  . pantoprazole  40 mg Oral Daily  . sodium chloride  3 mL Intravenous Q12H  . sodium chloride  3 mL Intravenous Q12H  . Warfarin - Pharmacist Dosing Inpatient   Does not apply q1800    LABS: Basic Metabolic Panel:  Recent Labs  82/95/62 1110 12/21/12 0800 12/22/12 0408  NA  --  128* 127*  K  --  3.8 3.9  CL  --  93* 93*  CO2  --  24 24  GLUCOSE  --  102* 115*  BUN  --  14 17  CREATININE  --  0.77 0.84  CALCIUM  --  9.2 9.1  MG 2.2  --  2.1   Liver Function Tests: No results found for this basename: AST, ALT, ALKPHOS, BILITOT, PROT, ALBUMIN,  in the last 72 hours CBC: No  results found for this basename: WBC, NEUTROABS, HGB, HCT, MCV, PLT,  in the last 72 hours Cardiac Enzymes: No results found for this basename: CKTOTAL, CKMB, CKMBINDEX, TROPONINI,  in the last 72 hoursThyroid Function Tests: No results found for this basename: TSH, T4TOTAL, FREET3, T3FREE, THYROIDAB,  in the last 72 hours INR: 1.76  Radiology/Studies:  Dg Chest 2 View 12/19/2012   *RADIOLOGY REPORT*  Clinical Data: Congestive heart failure.  Cough.  CHEST - 2 VIEW  Comparison: 12/18/2012 03/31/2012  Findings: The interstitial pulmonary edema has almost completely resolved.  Tiny residual effusions.  Heart size and pulmonary vascularity are normal.  No acute osseous abnormality.  IMPRESSION: Almost complete resolution of congestive heart failure.   Original Report Authenticated By: Francene Boyers, M.D.   PHYSICAL EXAM Well appearing NAD HEENT: Unremarkable Back:  No CVA tenderness Lungs:  Clear with no wheezes HEART:  IRegular rate rhythm, no murmurs, no rubs, no clicks Abd:  soft, positive bowel sounds, no organomegally, no rebound, no guarding Ext:  2 plus pulses, no edema, no cyanosis,  no clubbing Skin:  No rashes no nodules Neuro:  CN II through XII intact, motor grossly intact  A/P 1. Paroxysmal atrial fib 2. Probable tachy induced CM 3. IVCD 4. HTN 5. Acute systolic CHF Plan: Rhythmol has been stopped and will plan to start Tikosyn today. His atrial fib rates appear to be well controlled.   Leonia Reeves.D.

## 2012-12-22 NOTE — Progress Notes (Signed)
Pt started on Tikosyn. Pt ambulated hall. Pt still in Afib per CMT.

## 2012-12-22 NOTE — Progress Notes (Signed)
12/22/12 1245 Noted CM referral for Tikosyn.  In to speak with pt. about medication assistance, and pt. states he uses [facility] [phone]).  TC to this pharmacy and was told that they are not enrolled in Tikosyn program, however the [facility] on [address] will assist [facility].  TC to [facility] [phone]), and they do have Tikosyn in stock.  Physician, please write 2 RX for Tikosyn; 1 RX for 7days for Tikosyn fund from [employer], and 1 refill.  Spoke with pt. about going to [facility] to fill his Tikosyn as well.  Pt. may dc home on Saturday. Pt. is up ad lib, and does not need any home health services or DME at this time. Tera Mater, RN, BSN NCM [phone]

## 2012-12-22 NOTE — Progress Notes (Signed)
Pharmacy Consult for Dofetilide (Tikosyn) Iniation  Admit Complaint: 72 y.o. male admitted 12/18/2012 with atrial fibrillation to be initiated on dofetilide.   Assessment:  Patient Exclusion Criteria: If any screening criteria checked as "Yes", then  patient  should NOT receive dofetilide until criteria item is corrected. If "Yes" please indicate correction plan.  YES  NO Patient  Exclusion Criteria Correction Plan  []  [x]  Baseline QTc interval is greater than or equal to 440 msec. IF above YES box checked dofetilide contraindicated unless patient has ICD; then may proceed if QTc 500-550 msec or with known ventricular conduction abnormalities may proceed with QTc 550-600 msec. QTc = 0.42   []  [x]  Magnesium level is less than 1.8 mEq/l : Last magnesium:  Lab Results  Component Value Date   MG 2.1 12/22/2012         [x]  []  Potassium level is less than 4 mEq/l : Last potassium:  Lab Results  Component Value Date   K 3.9 12/22/2012        * Spoke with Dr Ladona Ridgel this morning and gave KCl 20 mEq po x 1 stat. He still wanted to start Tikosyn this morning. Will f/u a.m BMET  []  [x]  Patient is known or suspected to have a digoxin level greater than 2 ng/ml: No results found for this basename: DIGOXIN      []  [x]  Creatinine clearance less than 20 ml/min (calculated using Cockcroft-Gault, actual body weight and serum creatinine): Estimated Creatinine Clearance: 82.1 ml/min (by C-G formula based on Cr of 0.84).    [x]  []  Patient has received drugs known to prolong the QT intervals within the last 48 hours(phenothiazines, tricyclics or tetracyclic antidepressants, erythromycin, H-1 antihistamines, cisapride, fluoroquinolones, azithromycin). Drugs not listed above may have an, as yet, undetected potential to prolong the QT interval, updated information on QT prolonging agents is available at this website:QT prolonging agents * The patient was on doxepin (TCA) and did receive a dose of 7/29 evening,  this was discussed with Dr. Ladona Ridgel and discontinued. The patient was on a low dose which minimizes possible effects of QTc prolongation  []  [x]  Patient received a dose of hydrochlorothiazide (Oretic) alone or in any combination including triamterene (Dyazide, Maxzide) in the last 48 hours.   []  [x]  Patient received a medication known to increase dofetilide plasma concentrations prior to initial dofetilide dose:    Trimethoprim (Primsol, Proloprim) in the last 36 hours   Verapamil (Calan, Verelan) in the last 36 hours or a sustained release dose in the last 72 hours   Megestrol (Megace) in the last 5 days    Cimetidine (Tagamet) in the last 6 hours   Ketoconazole (Nizoral) in the last 24 hours   Itraconazole (Sporanox) in the last 48 hours    Prochlorperazine (Compazine) in the last 36 hours    []  [x]  Patient is known to have a history of torsades de pointes; congenital or acquired long QT syndromes.   []  [x]  Patient has received a Class 1 antiarrhythmic with less than 2 half-lives since last dose. (Disopyramide, Quinidine, Procainamide, Lidocaine, Mexiletine, Flecainide, Propafenone) Rhythmol stopped 7/28 at 1000, >2 half lives have passed.   []  [x]  Patient has received amiodarone therapy in the past 3 months or amiodarone level is greater than 0.3 ng/ml.    Patient has been appropriately anticoagulated with Lovenox + Warfarin.  Ordering provider was confirmed at [email or web address] if they are not listed on the [facility] Authorized Prescribers list.  Goal of Therapy:  Follow renal  function, electrolytes, potential drug interactions, and dose adjustment. Provide education and 1 week supply at discharge.  Plan:  1.  Initiate dofetilide based on renal function: Select One Calculated CrCl  Dose q12h  [x]  > 60 ml/min 500 mcg  []  40-60 ml/min 250 mcg  []  20-40 ml/min 125 mcg   2. Follow up QTc after the first 5 doses, renal function, electrolytes (K & Mg) daily x 3     days, dose adjustment,  success of initiation and facilitate 1 week discharge supply as     clinically indicated.  3. Initiate Tikosyn education video (Call [phone] and ask for video # [phone]).  4. Place Enrollment Form on the chart for discharge supply of dofetilide.   Georgina Pillion, PharmD, BCPS Clinical Pharmacist Pager: [phone] 12/22/2012 9:34 AM

## 2012-12-22 NOTE — Progress Notes (Signed)
ANTICOAGULATION CONSULT NOTE - Follow Up Consult  Pharmacy Consult for Lovenox + Warfarin Indication: atrial fibrillation  No Known Allergies  Patient Measurements: Height: 5\' 7"  (170.2 cm) Weight: 183 lb 14.4 oz (83.416 kg) (Scale A) IBW/kg (Calculated) : 66.1  Vital Signs: Temp: 97.5 F (36.4 C) (07/30 0511) Temp src: Oral (07/30 0511) BP: 100/72 mmHg (07/30 0944) Pulse Rate: 71 (07/30 0944)  Labs:  Recent Labs  12/20/12 0412 12/21/12 0800 12/21/12 1120 12/22/12 0408  LABPROT 20.0*  --  17.2* 18.1*  INR 1.76*  --  1.44 1.54*  CREATININE 0.86 0.77  --  0.84    Estimated Creatinine Clearance: 82.1 ml/min (by C-G formula based on Cr of 0.84).   Assessment: 72 y.o. M on lovenox bridge to therapeutic INR with warfarin for hx Afib and also for anticoagulation in the setting of Tikosyn initiation. INR this mroning remains SUBtherapeutic though trending up (INR 1.54 << 1.44, goal of 2-3). Renal function remains stable -- lovenox dose appropriate  PTA dose was noted to be 5 mg daily EXCEPT for 7.5 mg on Wednesdays only.   Goal of Therapy:  INR 2-3 Anti-Xa level 0.6-1.2 units/ml 4hrs after LMWH dose given Monitor platelets by anticoagulation protocol: Yes   Plan:  1. Continue Lovenox 80 mg SQ every 12 hours 2. Warfarin 7.5 mg x 1 dose at 1800 today 3. Will continue to monitor for any signs/symptoms of bleeding and will follow up with PT/INR in the a.m.   Georgina Pillion, PharmD, BCPS Clinical Pharmacist Pager: [phone] 12/22/2012 11:56 AM

## 2012-12-23 DIAGNOSIS — J9600 Acute respiratory failure, unspecified whether with hypoxia or hypercapnia: Secondary | ICD-10-CM | POA: Diagnosis not present

## 2012-12-23 DIAGNOSIS — I509 Heart failure, unspecified: Secondary | ICD-10-CM | POA: Diagnosis not present

## 2012-12-23 DIAGNOSIS — I5033 Acute on chronic diastolic (congestive) heart failure: Secondary | ICD-10-CM | POA: Diagnosis not present

## 2012-12-23 DIAGNOSIS — I428 Other cardiomyopathies: Secondary | ICD-10-CM | POA: Diagnosis not present

## 2012-12-23 DIAGNOSIS — Q231 Congenital insufficiency of aortic valve: Secondary | ICD-10-CM | POA: Diagnosis not present

## 2012-12-23 LAB — BASIC METABOLIC PANEL
CO2: 24 mEq/L (ref 19–32)
Chloride: 95 mEq/L — ABNORMAL LOW (ref 96–112)
Creatinine, Ser: 0.74 mg/dL (ref 0.50–1.35)
Potassium: 4.2 mEq/L (ref 3.5–5.1)
Sodium: 129 mEq/L — ABNORMAL LOW (ref 135–145)

## 2012-12-23 LAB — MAGNESIUM: Magnesium: 2.1 mg/dL (ref 1.5–2.5)

## 2012-12-23 MED ORDER — WARFARIN SODIUM 5 MG PO TABS
5.0000 mg | ORAL_TABLET | Freq: Once | ORAL | Status: AC
  Administered 2012-12-23: 5 mg via ORAL
  Filled 2012-12-23: qty 1

## 2012-12-23 MED ORDER — ZOLPIDEM TARTRATE 5 MG PO TABS
5.0000 mg | ORAL_TABLET | Freq: Every evening | ORAL | Status: DC | PRN
  Administered 2012-12-23: 5 mg via ORAL
  Filled 2012-12-23: qty 1

## 2012-12-23 NOTE — Progress Notes (Addendum)
     Patient: Travis [name] Date of Encounter: 12/23/2012, 7:04 AM Admit date: 12/18/2012     Subjective  [name] reports he feels well and has no complaints. He denies CP, SOB or palpitations.   Objective  Physical Exam: Vitals: BP 116/77  Pulse 62  Temp(Src) 97.6 F (36.4 C) (Oral)  Resp 20  Ht 5\' 7"  (1.702 m)  Wt 182 lb 15.7 oz (83 kg)  BMI 28.65 kg/m2  SpO2 97% General: Well developed, well appearing 72 year old male in no acute distress. Neck: Supple. JVD not elevated. Lungs: Clear bilaterally to auscultation without wheezes, rales, or rhonchi. Breathing is unlabored. Heart: Irregular S1 S2 without murmurs, rubs, or gallops.  Abdomen: Soft, non-distended. Extremities: No clubbing or cyanosis. No edema.  Distal pedal pulses are 2+ and equal bilaterally. Neuro: Alert and oriented X 3. Moves all extremities spontaneously. No focal deficits.  Intake/Output:  Intake/Output Summary (Last 24 hours) at 12/23/12 0704 Last data filed at 12/23/12 0529  Gross per 24 hour  Intake   1243 ml  Output   1800 ml  Net   -557 ml    Inpatient Medications:  . amLODipine  5 mg Oral Daily  . dofetilide  500 mcg Oral Q12H  . doxepin  10 mg Oral QHS  . enoxaparin (LOVENOX) injection  80 mg Subcutaneous Q12H  . furosemide  20 mg Oral Daily  . irbesartan  75 mg Oral Daily  . metoprolol tartrate  25 mg Oral TID  . pantoprazole  40 mg Oral Daily  . sodium chloride  3 mL Intravenous Q12H  . Warfarin - Pharmacist Dosing Inpatient   Does not apply q1800    Labs:  Recent Labs  12/22/12 0408 12/22/12 1107  NA 127* 127*  K 3.9 4.6  CL 93* 94*  CO2 24 24  GLUCOSE 115* 131*  BUN 17 18  CREATININE 0.84 0.78  CALCIUM 9.1 9.3  MG 2.1 2.2    Recent Labs  12/23/12 0558  INR 1.93*    Radiology/Studies: Dg Chest 2 View  12/19/2012   *RADIOLOGY REPORT*  Clinical Data: Congestive heart failure.  Cough.  CHEST - 2 VIEW  Comparison: 12/18/2012 03/31/2012  Findings: The interstitial  pulmonary edema has almost completely resolved.  Tiny residual effusions.  Heart size and pulmonary vascularity are normal.  No acute osseous abnormality.  IMPRESSION: Almost complete resolution of congestive heart failure.   Original Report Authenticated By: Francene Boyers, M.D.    Echocardiogram: EF 30-35%, LA 47, mild AS with known bicuspid aortic valve with fusion of left and non coronary cusps.   Telemetry: persistent AF, overall improved rate control in last 24 hours 12-lead ECG: this AM shows AF, 97 bpm; QTc ~470 msec    Assessment and Plan  1. Persistent AF 2. Probable tachycardia-mediated CM 3. IVCD  4. HTN 5. Acute systolic HF 6. Subtherapeutic INR 7. Hyponatremia, improving since admission 8. Bicuspid aortic valve with mild AS  [name] remains in AF but with improved rate. His serum Cr and potassium level are stable. QTc stable. Continue Tikosyn. Dr. Ladona Ridgel to see.  Signed, Exie Parody  EP Attending  Patient seen and examined. Agree with above exam, assessment and plan. Continue Tikosyn, as he has reverted to NSR. Will followup ECG and QT. As his QRS duration is increased, we have some leeway in QT measurment.  Lewayne Bunting, M.D.

## 2012-12-23 NOTE — Significant Event (Signed)
Pt converted from Afib to SB ,EKG on chart Dr Ladona Ridgel  aware.

## 2012-12-23 NOTE — Progress Notes (Signed)
ANTICOAGULATION CONSULT NOTE - Follow Up Consult  Pharmacy Consult for Lovenox + Warfarin Indication: atrial fibrillation  No Known Allergies  Patient Measurements: Height: 5\' 7"  (170.2 cm) Weight: 182 lb 15.7 oz (83 kg) IBW/kg (Calculated) : 66.1  Vital Signs: Temp: 97.6 F (36.4 C) (07/31 0526) Temp src: Oral (07/31 0526) BP: 116/77 mmHg (07/31 0526) Pulse Rate: 62 (07/31 0526)  Labs:  Recent Labs  12/21/12 1120 12/22/12 0408 12/22/12 1107 12/23/12 0558  LABPROT 17.2* 18.1*  --  21.5*  INR 1.44 1.54*  --  1.93*  CREATININE  --  0.84 0.78 0.74    Estimated Creatinine Clearance: 86.1 ml/min (by C-G formula based on Cr of 0.74).   Assessment: 72 y.o. M on lovenox bridge to therapeutic INR with warfarin for hx Afib and also for anticoagulation in the setting of Tikosyn initiation. INR this morning remains slightly SUBtherapeutic though trending up (INR 1.93 << 1.54, goal of 2-3). Renal function remains stable -- lovenox dose appropriate. It is expected that the INR will reach goal range tomorrow -- at which time lovenox may be discontinued.   PTA dose was noted to be 5 mg daily EXCEPT for 7.5 mg on Wednesdays only.   Goal of Therapy:  INR 2-3 Anti-Xa level 0.6-1.2 units/ml 4hrs after LMWH dose given Monitor platelets by anticoagulation protocol: Yes   Plan:  1. Continue Lovenox 80 mg SQ every 12 hours 2. Warfarin 5 mg x 1 dose at 1800 today 3. Will continue to monitor for any signs/symptoms of bleeding and will follow up with PT/INR in the a.m.   Georgina Pillion, PharmD, BCPS Clinical Pharmacist Pager: [phone] 12/23/2012 8:37 AM

## 2012-12-24 DIAGNOSIS — I5033 Acute on chronic diastolic (congestive) heart failure: Secondary | ICD-10-CM | POA: Diagnosis not present

## 2012-12-24 DIAGNOSIS — I509 Heart failure, unspecified: Secondary | ICD-10-CM | POA: Diagnosis not present

## 2012-12-24 LAB — BASIC METABOLIC PANEL
BUN: 15 mg/dL (ref 6–23)
CO2: 25 mEq/L (ref 19–32)
Calcium: 9.1 mg/dL (ref 8.4–10.5)
Chloride: 97 mEq/L (ref 96–112)
Creatinine, Ser: 0.75 mg/dL (ref 0.50–1.35)
Glucose, Bld: 106 mg/dL — ABNORMAL HIGH (ref 70–99)

## 2012-12-24 MED ORDER — ENOXAPARIN SODIUM 80 MG/0.8ML ~~LOC~~ SOLN: 80.0000 mg | Freq: Two times a day (BID) | SUBCUTANEOUS | Status: DC

## 2012-12-24 MED ORDER — DOFETILIDE 500 MCG PO CAPS: 500.0000 ug | ORAL_CAPSULE | Freq: Two times a day (BID) | ORAL | Status: DC

## 2012-12-24 MED ORDER — FUROSEMIDE 20 MG PO TABS: 20.0000 mg | ORAL_TABLET | Freq: Every day | ORAL | Status: DC

## 2012-12-24 MED ORDER — WARFARIN SODIUM 5 MG PO TABS
5.0000 mg | ORAL_TABLET | Freq: Once | ORAL | Status: DC
  Filled 2012-12-24: qty 1

## 2012-12-24 MED ORDER — ZOLPIDEM TARTRATE 5 MG PO TABS: 5.0000 mg | ORAL_TABLET | Freq: Every evening | ORAL | Status: DC | PRN

## 2012-12-24 MED ORDER — METOPROLOL TARTRATE 25 MG PO TABS: 25.0000 mg | ORAL_TABLET | Freq: Two times a day (BID) | ORAL | Status: DC

## 2012-12-24 NOTE — Progress Notes (Signed)
ANTICOAGULATION CONSULT NOTE - Follow Up Consult  Pharmacy Consult for Lovenox + Warfarin Indication: atrial fibrillation  No Known Allergies  Patient Measurements: Height: 5\' 7"  (170.2 cm) Weight: 184 lb 4.9 oz (83.6 kg) IBW/kg (Calculated) : 66.1  Vital Signs: Temp: 97.4 F (36.3 C) (08/01 0647) Temp src: Oral (08/01 0647) BP: 106/71 mmHg (08/01 0950) Pulse Rate: 56 (08/01 0950)  Labs:  Recent Labs  12/22/12 0408 12/22/12 1107 12/23/12 0558 12/24/12 0505  LABPROT 18.1*  --  21.5* 21.4*  INR 1.54*  --  1.93* 1.92*  CREATININE 0.84 0.78 0.74 0.75    Estimated Creatinine Clearance: 86.3 ml/min (by C-G formula based on Cr of 0.75).   Assessment: 72 y.o. M on Lovenox bridge until INR is therapeutic with warfarin for hx Afib and also for anticoagulation in the setting of Tikosyn initiation. INR this morning still remains slightly SUBtherapeutic at 1.92, relatively unchanged from yesterday. Renal function remains stable. No bleeding noted. Lovenox will need to continue until INR is therapeutic (goal 2-3).  PTA dose was noted to be 5 mg daily EXCEPT for 7.5 mg on Wednesdays only.   Goal of Therapy:  INR 2-3 Anti-Xa level 0.6-1.2 units/ml 4hrs after LMWH dose given Monitor platelets by anticoagulation protocol: Yes   Plan:  1. Continue Lovenox 80 mg SQ Q12h 2. Warfarin 5 mg x 1 tonight 3. Will continue to monitor for any signs/symptoms of bleeding and will follow up with PT/INR in the a.m.  4. If patient is discharged, would recommend continuing home dose of warfarin and Lovenox shots and having INR checked on Monday 8/4  Temperance Kelemen D. Kendrik Mcshan, PharmD  Clinical Pharmacist Pager: [phone] 12/24/2012 10:47 AM

## 2012-12-24 NOTE — Discharge Summary (Signed)
ELECTROPHYSIOLOGY DISCHARGE SUMMARY    Patient ID: Travis [name],  MRN: [id number], DOB/AGE: [birth year] 72 y.o.  Admit date: 12/18/2012 Discharge date: 12/24/2012  Primary Care Physician: Darryll Capers, MD Primary Cardiologist: [name], MD  Primary Discharge Diagnosis:  1. Acute respiratory failure, resolved 2. Acute systolic HF 3. Hyponatremia, chronic 4. Persistent AF s/p recent DCCV with ERAF, now s/p Tikosyn loading  Secondary Discharge Diagnoses:  1. Bicuspid aortic valve 2. LBBB 3. HTN 4. GERD 5. Osteoarthritis  Procedures This Admission:  1. Transhoracic echocardiogram  Study Conclusions - Left ventricle: Diffuse hypokinisis worse in the septum and apex with septal dysnergy The cavity size was mildly dilated. Wall thickness was normal. Systolic function was moderately to severely reduced. The estimated ejection fraction was in the range of 30% to 35%. - Aortic valve: Appears trileaflet with restricted non and left coronary cusps There was mild stenosis. Mild regurgitation. - Mitral valve: Mild regurgitation. - Left atrium: The atrium was mildly dilated. - Atrial septum: No defect or patent foramen ovale was identified. - Pericardium, extracardiac: A trivial pericardial effusion was identified posterior to the heart.  History and Hospital Course:  Travis [name] is a 72 year old man with longstanding PAF s/p DCCV 12/16/2012 by Dr Jens Som who presented to the ED on 12/18/2012 with acute SOB, found to be hypoxic requiring NRB. His chest x-ray showed pulmonary edema and he was admitted for diuresis. He responded well to IV diuresis and his SOB resolved. Rythmol was discontinued in setting of HF. His beta blocker was uptitrated for better rate control. An echo was done which revealed EF 30-35%, LA dimension 47 mm. AAD therapy was recommended. After 2 day washout from Rythmol, Tikosyn was initiated on 12/22/2012 per protocol. His serum Cr, potassium level and QTc  remained stable. On 12/23/2012, he converted to SR. Of note, during hospitalization, his INR was subtherapeutic. He was bridged with Lovenox. Today he has been seen, examined and deemed stable for discharge home by Dr. Lewayne Bunting. He will continue Lovenox bridging until his INR is therapeutic (goal 2-3). He will have an INR drawn on Monday, 12/27/2012. He will have repeat ECG and labs (BMET) in one week. He will follow-up with Dr. Ladona Ridgel in 4 weeks.    Discharge Vitals: Blood pressure 127/69, pulse 51, temperature 97.4 F (36.3 C), temperature source Oral, resp. rate 18, height 5\' 7"  (1.702 m), weight 184 lb 4.9 oz (83.6 kg), SpO2 97.00%.   Labs: Lab Results  Component Value Date   WBC 6.9 12/18/2012   HGB 17.1* 12/18/2012   HCT 45.6 12/18/2012   MCV 96.0 12/18/2012   PLT 197 12/18/2012    Recent Labs Lab 12/18/12 1400  12/24/12 0505  NA 122*  < > 132*  K 4.4  < > 4.1  CL 89*  < > 97  CO2 22  < > 25  BUN 13  < > 15  CREATININE 0.82  < > 0.75  CALCIUM 9.1  < > 9.1  PROT 7.3  --   --   BILITOT 0.5  --   --   ALKPHOS 109  --   --   ALT 24  --   --   AST 43*  --   --   GLUCOSE 142*  < > 106*  < > = values in this interval not displayed. Lab Results  Component Value Date   CKTOTAL 141 06/24/2009   CKMB 2.3 06/24/2009   TROPONINI <0.30 12/19/2012  Recent Labs  12/24/12 0505  INR 1.92*    Disposition:  The patient is being discharged in stable condition.  Follow-up: Follow-up Information   Follow [facility] with [facility] [address] [facility]. ([facility] [address] to fill your T[facility]yn medication.  ([facility] NOT [facility]yn))    Contact information:   [phone]      Follow up with [facility] On 12/27/2012. (At 9:30 AM for Coumadin follow-up)    Contact information:   [address] Christena Flake [facility] [place] [address] [phone]      Follow up with [facility] On 01/03/2013. (At 10:00 AM for 12-lead ECG and labs (BMET))    Contact information:    [address] [place] [address] [phone]      Follow up with Lewayne Bunting, MD On 01/21/2013. (At 10:15 AM)    Contact information:   [address] [place] [address] [phone]     Discharge Medications:    Medication List    STOP taking these medications       doxepin 10 MG capsule  Commonly known as:  SINEQUAN     Milk Thistle 500 MG Caps     propafenone 225 MG tablet  Commonly known as:  RYTHMOL      TAKE these medications       amLODipine 5 MG tablet  Commonly known as:  NORVASC  Take 5 mg by mouth daily.     b complex vitamins tablet  Take 1 tablet by mouth daily.     dofetilide 500 MCG capsule  Commonly known as:  TIKOSYN  Take 1 capsule (500 mcg total) by mouth every 12 (twelve) hours.     enoxaparin 80 MG/0.8ML injection  Commonly known as:  LOVENOX  Inject 0.8 mLs (80 mg total) into the skin every 12 (twelve) hours.     finasteride 1 MG tablet  Commonly known as:  PROPECIA  Take 1 mg by mouth daily.     fish oil-omega-3 fatty acids 1000 MG capsule  Take 1 g by mouth daily.     furosemide 20 MG tablet  Commonly known as:  LASIX  Take 1 tablet (20 mg total) by mouth daily.     GLUCOSAMINE RELIEF 1000 MG Tabs  Generic drug:  Glucosamine Sulfate  Take 1 tablet by mouth 2 (two) times daily.     meloxicam 15 MG tablet  Commonly known as:  MOBIC  Take 15 mg by mouth daily.     metoprolol tartrate 25 MG tablet  Commonly known as:  LOPRESSOR  Take 1 tablet (25 mg total) by mouth 2 (two) times daily.     omeprazole 20 MG capsule  Commonly known as:  PRILOSEC  Take 20 mg by mouth daily.     RA FLAX SEED OIL 1000 PO  Take 1,000 mg by mouth 3 (three) times daily.     telmisartan 80 MG tablet  Commonly known as:  MICARDIS  Take 80 mg by mouth daily.     warfarin 5 MG tablet  Commonly known as:  COUMADIN  Take 5-7.5 mg by mouth daily. Take 5mg  daily, except take 7.5mg  Wednesdays     zolpidem 5 MG  tablet  Commonly known as:  AMBIEN  Take 1 tablet (5 mg total) by mouth at bedtime as needed for sleep.       Duration of Discharge Encounter: Greater than 30 minutes including physician time.  Signed, Rick Duff, PA-C 12/24/2012, 9:12 AM

## 2012-12-24 NOTE — Progress Notes (Signed)
The patient did not have any complaints overnight and stayed in normal sinus rhythm.

## 2012-12-24 NOTE — Progress Notes (Signed)
Went over all discharge instructions. Pt has Rx dose of Tikosyn to last 1 week.. Aware of INR draw on Monday. Pt was able to give Lovenox proficiently. Discharged - decided to walk accompanied by friend. Pt has all his belongings.

## 2012-12-24 NOTE — Progress Notes (Signed)
Working with pt on giving Lovenox injections. Return demo using teaching set up.

## 2012-12-26 ENCOUNTER — Other Ambulatory Visit: Payer: Self-pay | Admitting: Internal Medicine

## 2012-12-27 ENCOUNTER — Ambulatory Visit (INDEPENDENT_AMBULATORY_CARE_PROVIDER_SITE_OTHER): Payer: Medicare Other | Admitting: General Practice

## 2012-12-27 DIAGNOSIS — I4891 Unspecified atrial fibrillation: Secondary | ICD-10-CM | POA: Diagnosis not present

## 2012-12-27 DIAGNOSIS — I447 Left bundle-branch block, unspecified: Secondary | ICD-10-CM | POA: Diagnosis not present

## 2012-12-27 DIAGNOSIS — I428 Other cardiomyopathies: Secondary | ICD-10-CM

## 2012-12-27 DIAGNOSIS — I420 Dilated cardiomyopathy: Secondary | ICD-10-CM

## 2013-01-03 ENCOUNTER — Ambulatory Visit: Payer: Medicare Other

## 2013-01-03 ENCOUNTER — Encounter: Payer: Self-pay | Admitting: Internal Medicine

## 2013-01-03 ENCOUNTER — Ambulatory Visit (INDEPENDENT_AMBULATORY_CARE_PROVIDER_SITE_OTHER): Payer: Medicare Other | Admitting: Nurse Practitioner

## 2013-01-03 ENCOUNTER — Ambulatory Visit (INDEPENDENT_AMBULATORY_CARE_PROVIDER_SITE_OTHER): Payer: Medicare Other | Admitting: *Deleted

## 2013-01-03 VITALS — BP 138/72 | HR 52 | Resp 16 | Wt 186.8 lb

## 2013-01-03 DIAGNOSIS — I447 Left bundle-branch block, unspecified: Secondary | ICD-10-CM | POA: Diagnosis not present

## 2013-01-03 DIAGNOSIS — I428 Other cardiomyopathies: Secondary | ICD-10-CM

## 2013-01-03 DIAGNOSIS — I4891 Unspecified atrial fibrillation: Secondary | ICD-10-CM

## 2013-01-03 DIAGNOSIS — I420 Dilated cardiomyopathy: Secondary | ICD-10-CM

## 2013-01-03 DIAGNOSIS — Q231 Congenital insufficiency of aortic valve: Secondary | ICD-10-CM

## 2013-01-03 LAB — CBC WITH DIFFERENTIAL/PLATELET
Basophils Absolute: 0 10*3/uL (ref 0.0–0.1)
HCT: 45.3 % (ref 39.0–52.0)
Hemoglobin: 15.2 g/dL (ref 13.0–17.0)
Lymphs Abs: 1.8 10*3/uL (ref 0.7–4.0)
MCV: 103.9 fl — ABNORMAL HIGH (ref 78.0–100.0)
Monocytes Absolute: 0.6 10*3/uL (ref 0.1–1.0)
Neutro Abs: 3.2 10*3/uL (ref 1.4–7.7)
Platelets: 289 10*3/uL (ref 150.0–400.0)
RDW: 13.6 % (ref 11.5–14.6)

## 2013-01-03 LAB — PROTIME-INR: Prothrombin Time: 23.5 s — ABNORMAL HIGH (ref 10.2–12.4)

## 2013-01-03 NOTE — Progress Notes (Signed)
Patient presents ambulatory from lobby in no acute distress here for f/u s/p Tikosyn initiation 7/30. Patient ambulates with steady gait, skin warm dry and acyanotic; patient denies complaints, states he has been feeling well since beginning the Tikosyn.  Patient was taken to an exam room and vital signs were taken.  12-lead EKG was obtained and taken to Dr. Graciela Husbands, DOD and also patient's electrophysiology cardiologist.  Dr. Graciela Husbands states EKG looks good; is aware BMET ordered for today; and would like to f/u with patient in next 2 weeks.  Patient states he already has an appointment with Dr. Graciela Husbands for Tuesday 8/19 @ 11:30, which was verified in EPIC.  Patient was taken to the lab in no acute distress.

## 2013-01-04 ENCOUNTER — Ambulatory Visit (INDEPENDENT_AMBULATORY_CARE_PROVIDER_SITE_OTHER): Payer: Medicare Other | Admitting: Internal Medicine

## 2013-01-04 VITALS — BP 122/63 | HR 43 | Ht 68.0 in | Wt 184.8 lb

## 2013-01-04 DIAGNOSIS — I498 Other specified cardiac arrhythmias: Secondary | ICD-10-CM | POA: Diagnosis not present

## 2013-01-04 DIAGNOSIS — I428 Other cardiomyopathies: Secondary | ICD-10-CM

## 2013-01-04 DIAGNOSIS — I447 Left bundle-branch block, unspecified: Secondary | ICD-10-CM | POA: Diagnosis not present

## 2013-01-04 DIAGNOSIS — I4891 Unspecified atrial fibrillation: Secondary | ICD-10-CM | POA: Diagnosis not present

## 2013-01-04 DIAGNOSIS — I480 Paroxysmal atrial fibrillation: Secondary | ICD-10-CM

## 2013-01-04 DIAGNOSIS — I420 Dilated cardiomyopathy: Secondary | ICD-10-CM

## 2013-01-04 DIAGNOSIS — R001 Bradycardia, unspecified: Secondary | ICD-10-CM

## 2013-01-04 DIAGNOSIS — F5102 Adjustment insomnia: Secondary | ICD-10-CM

## 2013-01-04 MED ORDER — DOFETILIDE 500 MCG PO CAPS: 500.0000 ug | ORAL_CAPSULE | Freq: Two times a day (BID) | ORAL | Status: DC

## 2013-01-04 MED ORDER — CARVEDILOL 3.125 MG PO TABS: 3.1250 mg | ORAL_TABLET | Freq: Two times a day (BID) | ORAL | Status: DC

## 2013-01-04 NOTE — Assessment & Plan Note (Signed)
Patient is holding sinus rhythm on dofetilide. QTC was normal. He will need a repeat assessment potassium and magnesium. We will check this in 4 weeks.

## 2013-01-04 NOTE — Assessment & Plan Note (Signed)
As above.

## 2013-01-04 NOTE — Progress Notes (Signed)
Patient Care Team: Stacie Glaze, MD as PCP - General   HPI  Travis [name] is a 72 y.o. male Seen in followup for paroxysmal atrial fibrillation and tachycardia-induced and resolved cardiomyopathy seen in followup today.   He was seen early July 2014 in sinus rhythm having been maintained on Rythmol and has had infrequent recurrences Most recent echo at that time 7/13 >>EF 55% w a now recognized bicuspid aortic valve.  A week or so later he underwent cardioversion successfully. 48 hours laterhe was admitted to the hospital with congestive heart failure found to be in sinus rhythm but then reverted to atrial fibrillation. Rythmol was discontinued and dofetilide was initiated.  Repeat echo demonstrated an ejection fraction of 30-35%he has underlying left bundle branch block which is chronic    At this point he is feeling quite well. INR yesterday was 2.3. QTC was 420.  He also notes that his doxepin was discontinued in hospital.      Past Medical History  Diagnosis Date  . Arthritis   . Hypertension   . GERD (gastroesophageal reflux disease)   . Bicuspid aortic valve   . 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 Sinus bradycardia   . Paroxysmal atrial fibrillation     On Rythmol  . Cardiomyopathy -resolved     tachycardia-induced, EF 55% June 2009  . Left bundle branch block   . History of cardioversion 12/16/2012    Past Surgical History  Procedure Laterality Date  . Cerebral anuersym post clips    . Rotator cuff repair      lf  . Fractured left arm    . Tonsillectomy    . Left tendon repair      lft foot  . Hernia repair      lft  . Wrist ganglion excision      lft  . Tonsillectomy    . Inguinal hernia repair  07/02/2011    Procedure: LAPAROSCOPIC INGUINAL HERNIA;  Surgeon: Shelly Rubenstein, MD;  Location: [facility] OR;  Service: General;  Laterality: Left;  Laparoscopic left inguinal hernia repair and mesh  . Cardiac catheterization    . Cardioversion N/A  12/16/2012    Procedure: CARDIOVERSION;  Surgeon: Lewayne Bunting, MD;  Location: [facility] ENDOSCOPY;  Service: Cardiovascular;  Laterality: N/A;    Current Outpatient Prescriptions  Medication Sig Dispense Refill  . amLODipine (NORVASC) 5 MG tablet Take 5 mg by mouth daily.      Marland Kitchen b complex vitamins tablet Take 1 tablet by mouth daily.       Marland Kitchen dofetilide (TIKOSYN) 500 MCG capsule Take 1 capsule (500 mcg total) by mouth every 12 (twelve) hours.  60 capsule  4  . finasteride (PROPECIA) 1 MG tablet Take 1 mg by mouth daily.      . fish oil-omega-3 fatty acids 1000 MG capsule Take 1 g by mouth daily.       . Flaxseed, Linseed, (RA FLAX SEED OIL 1000 PO) Take 1,000 mg by mouth 3 (three) times daily.       . furosemide (LASIX) 20 MG tablet Take 1 tablet (20 mg total) by mouth daily.  30 tablet  4  . Glucosamine Sulfate (GLUCOSAMINE RELIEF) 1000 MG TABS Take 1 tablet by mouth 2 (two) times daily.       . meloxicam (MOBIC) 15 MG tablet Take 15 mg by mouth daily.      . metoprolol tartrate (LOPRESSOR) 25 MG tablet Take 1 tablet (25  mg total) by mouth 2 (two) times daily.  60 tablet  4  . omeprazole (PRILOSEC) 20 MG capsule Take 20 mg by mouth daily.      Marland Kitchen telmisartan (MICARDIS) 80 MG tablet Take 80 mg by mouth daily.      Marland Kitchen warfarin (COUMADIN) 5 MG tablet Take 5-7.5 mg by mouth daily. Take 5mg  daily, except take 7.5mg  Wednesdays      . zolpidem (AMBIEN) 5 MG tablet Take 1 tablet (5 mg total) by mouth at bedtime as needed for sleep.  30 tablet  0   No current facility-administered medications for this visit.    No Known Allergies  Review of Systems negative except from HPI and PMH  Physical Exam BP 122/63  Pulse 43  Ht 5\' 8"  (1.727 m)  Wt 184 lb 12.8 oz (83.825 kg)  BMI 28.11 kg/m2 Well developed and well nourished in no acute distress HENT normal E scleral and icterus clear Neck Supple JVP flat; carotids brisk and full Clear to ausculation  slow Regular rate and rhythm, no murmurs gallops  or rub Soft with active bowel sounds No clubbing cyanosis none Edema Alert and oriented, grossly normal motor and sensory function Skin Warm and Dry  ECG demonstrates a normal QT intervals heart rate of 43  Assessment and  Plan

## 2013-01-04 NOTE — Patient Instructions (Addendum)
Your physician recommends that you return for lab work in: 4 weeks  Your physician has requested that you have an echocardiogram. Echocardiography is a painless test that uses sound waves to create images of your heart. It provides your doctor with information about the size and shape of your heart and how well your heart's chambers and valves are working. This procedure takes approximately one hour. There are no restrictions for this procedure. Schedule in 8 weeks.  Your physician recommends that you schedule a follow-up appointment in: 10 weeks with Rick Duff [facility] (lab work and EKG that day also)  Your physician wants you to follow-up in: 6 months with Dr. Graciela Husbands. You will receive a reminder letter in the mail two months in advance. If you don't receive a letter, please call our office to schedule the follow-up appointment.  Discontinue Lopressor  Start Coreg 3.125mg  (take one tablet twice a day)

## 2013-01-04 NOTE — Assessment & Plan Note (Signed)
It is not clear as to the cause of the recurrent cardiomyopathy. He is his suspect is to ablate atrial fibrillation but he takes his pulse regularly and had not felt a great healing irregularity. Another possible culprit in his left bundle branch block. It was global so makes ischemia less likely although if it doesn't resolve evaluation for this will become important. Other possibilities include systemic illness which is not likely.  We will change his metoprolol to carvedilol today based on the COMETt data; we will continue him on his ARB. I wonder about the impact on his bradycardia although he is quite asymptomatic at the present time. This is true even at the gym

## 2013-01-04 NOTE — Assessment & Plan Note (Signed)
His doxepin was stopped because of its tendency to prolonged QT interval; he then becomes a relative contraindication in the setting of dofetilide. I will forward this to Dr. Lovell Sheehan to consider alternative sleep medications

## 2013-01-11 ENCOUNTER — Other Ambulatory Visit: Payer: Self-pay | Admitting: *Deleted

## 2013-01-11 ENCOUNTER — Encounter: Payer: Self-pay | Admitting: Internal Medicine

## 2013-01-11 ENCOUNTER — Ambulatory Visit: Payer: Medicare Other | Admitting: Internal Medicine

## 2013-01-11 MED ORDER — TEMAZEPAM 15 MG PO CAPS: 15.0000 mg | ORAL_CAPSULE | Freq: Every evening | ORAL | Status: DC | PRN

## 2013-01-11 NOTE — Telephone Encounter (Signed)
Yah[place], dr Lovell Sheehan has ordered temazepam 15 mg 1 at bedtime as needed for sleep- I called [facility] into [facility]-hopes it helps

## 2013-01-21 ENCOUNTER — Ambulatory Visit: Payer: Medicare Other | Admitting: Internal Medicine

## 2013-01-23 ENCOUNTER — Other Ambulatory Visit: Payer: Self-pay | Admitting: Internal Medicine

## 2013-01-27 DIAGNOSIS — H2510 Age-related nuclear cataract, unspecified eye: Secondary | ICD-10-CM | POA: Diagnosis not present

## 2013-01-30 ENCOUNTER — Other Ambulatory Visit: Payer: Self-pay | Admitting: Internal Medicine

## 2013-02-01 ENCOUNTER — Other Ambulatory Visit (INDEPENDENT_AMBULATORY_CARE_PROVIDER_SITE_OTHER): Payer: Medicare Other

## 2013-02-01 DIAGNOSIS — I480 Paroxysmal atrial fibrillation: Secondary | ICD-10-CM

## 2013-02-01 DIAGNOSIS — I4891 Unspecified atrial fibrillation: Secondary | ICD-10-CM

## 2013-02-01 LAB — BASIC METABOLIC PANEL
BUN: 18 mg/dL (ref 6–23)
Chloride: 99 mEq/L (ref 96–112)
Creatinine, Ser: 0.8 mg/dL (ref 0.4–1.5)
GFR: 106.94 mL/min (ref >60.00)
Glucose, Bld: 105 mg/dL — ABNORMAL HIGH (ref 70–99)
Potassium: 4.5 mEq/L (ref 3.5–5.1)

## 2013-02-01 LAB — MAGNESIUM: Magnesium: 1.9 mg/dL (ref 1.5–2.5)

## 2013-02-09 ENCOUNTER — Encounter: Payer: Self-pay | Admitting: Internal Medicine

## 2013-02-09 ENCOUNTER — Ambulatory Visit (INDEPENDENT_AMBULATORY_CARE_PROVIDER_SITE_OTHER): Payer: Medicare Other | Admitting: Internal Medicine

## 2013-02-09 VITALS — BP 122/80 | HR 52 | Temp 98.2°F | Resp 16 | Ht 68.0 in | Wt 190.0 lb

## 2013-02-09 DIAGNOSIS — I428 Other cardiomyopathies: Secondary | ICD-10-CM

## 2013-02-09 DIAGNOSIS — I4891 Unspecified atrial fibrillation: Secondary | ICD-10-CM | POA: Diagnosis not present

## 2013-02-09 DIAGNOSIS — I10 Essential (primary) hypertension: Secondary | ICD-10-CM

## 2013-02-09 DIAGNOSIS — Z23 Encounter for immunization: Secondary | ICD-10-CM | POA: Diagnosis not present

## 2013-02-09 DIAGNOSIS — I420 Dilated cardiomyopathy: Secondary | ICD-10-CM

## 2013-02-09 DIAGNOSIS — E785 Hyperlipidemia, unspecified: Secondary | ICD-10-CM | POA: Diagnosis not present

## 2013-02-09 DIAGNOSIS — I447 Left bundle-branch block, unspecified: Secondary | ICD-10-CM

## 2013-02-09 DIAGNOSIS — Z125 Encounter for screening for malignant neoplasm of prostate: Secondary | ICD-10-CM

## 2013-02-09 DIAGNOSIS — Q2381 Bicuspid aortic valve: Secondary | ICD-10-CM

## 2013-02-09 DIAGNOSIS — Z0000 Encounter for general adult medical examination without abnormal findings: Secondary | ICD-10-CM

## 2013-02-09 DIAGNOSIS — N401 Enlarged prostate with lower urinary tract symptoms: Secondary | ICD-10-CM

## 2013-02-09 DIAGNOSIS — E871 Hypo-osmolality and hyponatremia: Secondary | ICD-10-CM

## 2013-02-09 DIAGNOSIS — T887XXA Unspecified adverse effect of drug or medicament, initial encounter: Secondary | ICD-10-CM

## 2013-02-09 DIAGNOSIS — Q231 Congenital insufficiency of aortic valve: Secondary | ICD-10-CM | POA: Diagnosis not present

## 2013-02-09 LAB — CBC WITH DIFFERENTIAL/PLATELET
Basophils Absolute: 0 10*3/uL (ref 0.0–0.1)
Eosinophils Absolute: 0.1 10*3/uL (ref 0.0–0.7)
HCT: 47.2 % (ref 39.0–52.0)
Hemoglobin: 16.1 g/dL (ref 13.0–17.0)
Lymphs Abs: 1.8 10*3/uL (ref 0.7–4.0)
MCHC: 34.2 g/dL (ref 30.0–36.0)
Monocytes Relative: 13.1 % — ABNORMAL HIGH (ref 3.0–12.0)
Neutro Abs: 2.4 10*3/uL (ref 1.4–7.7)
Platelets: 217 10*3/uL (ref 150.0–400.0)
RDW: 13.9 % (ref 11.5–14.6)

## 2013-02-09 LAB — LIPID PANEL
Cholesterol: 202 mg/dL — ABNORMAL HIGH (ref 0–200)
HDL: 65.5 mg/dL (ref >39.00)
Triglycerides: 44 mg/dL (ref 0.0–149.0)

## 2013-02-09 LAB — TSH: TSH: 1.26 u[IU]/mL (ref 0.35–5.50)

## 2013-02-09 LAB — PSA: PSA: 0.17 ng/mL (ref 0.10–4.00)

## 2013-02-09 LAB — HEPATIC FUNCTION PANEL
Albumin: 4 g/dL (ref 3.5–5.2)
Total Protein: 6.6 g/dL (ref 6.0–8.3)

## 2013-02-09 LAB — LDL CHOLESTEROL, DIRECT: Direct LDL: 128.1 mg/dL

## 2013-02-09 NOTE — Patient Instructions (Signed)
Anticoagulation Dose Instructions as of 02/09/2013     Glynis Smiles Tue Wed Thu Fri Sat   New Dose 5 mg 5 mg 5 mg 7.5 mg 5 mg 5 mg 5 mg    Description       Take 7.5 mg today (8/4) and take 7.5 mg tomorrow (8/5) and then continue to take 1 tablet every day except 1 1/2 tablet on Wednesdays.  Re-check in 1 week. Take Lovenox today and tomorrow and then stop.

## 2013-02-09 NOTE — Progress Notes (Signed)
Subjective:    Patient ID: Travis [name], male    DOB: 07/19/1940, 72 y.o.   MRN: [id number]  HPI  Is a 72 year old male who is followed for bicuspid aortic valve cardiomyopathy that his weight dependent and history of atrial fibrillation history of carotid bruit history of GERD.  He presents for his annual medical examination and maintenance of his chronic medical issues including his hypertension,mild hyperlipidemia osteoarthritis and use of medications such as Lasix fracture electrolytes and Mobic that might affect the liver and blood count  Review of Systems  Constitutional: Negative for fever and fatigue.  HENT: Negative for hearing loss, congestion, neck pain and postnasal drip.   Eyes: Negative for discharge, redness and visual disturbance.  Respiratory: Negative for cough, shortness of breath and wheezing.   Cardiovascular: Negative for leg swelling.  Gastrointestinal: Negative for abdominal pain, constipation and abdominal distention.  Genitourinary: Negative for urgency and frequency.  Musculoskeletal: Negative for joint swelling and arthralgias.  Skin: Negative for color change and rash.  Neurological: Negative for weakness and light-headedness.  Hematological: Negative for adenopathy.  Psychiatric/Behavioral: Negative for behavioral problems.       Objective:   Physical Exam  Constitutional: He appears well-developed and well-nourished.  HENT:  Head: Normocephalic and atraumatic.  Eyes: Conjunctivae are normal. Pupils are equal, round, and reactive to light.  Neck: Normal range of motion. Neck supple.  Cardiovascular: Normal rate and regular rhythm.   Pulmonary/Chest: Effort normal and breath sounds normal.  Abdominal: Soft. Bowel sounds are normal.          Assessment & Plan:  Monitor appropriate screening blood work for side effect of medications including liver the neck and CBC.  Monitor potassium and creatinine for diuretic use.  Monitor  lipid panel  with direct LDL from mild hyperlipidemia currently on omega-3 supplementation.   Subjective:    Travis [name] is a 72 y.o. male who presents for Medicare Annual/Subsequent preventive examination.   Preventive Screening-Counseling & Management  Tobacco History  Smoking status  . Former Smoker -- 1.50 packs/day  . Quit date: 05/26/1986  Smokeless tobacco  . Never Used    Problems Prior to Visit 1.   Current Problems (verified) Patient Active Problem List   Diagnosis Date Noted  . Sinus bradycardia 01/04/2013  . Pulmonary hypertension 03/09/2012  . Hyponatremia 02/21/2012  . Bicuspid aortic valve   . Paroxysmal atrial fibrillation   . Hypertension   . Cardiomyopathy, rate related with resolution now recurrent 12/08/2011  . Recurrent left inguinal hernia 05/12/2011  . Left bundle branch block 12/10/2010  . DRY MOUTH 11/07/2009  . ING HERN W/O MENTION OBST/GANGREN UNILAT/UNSPEC 08/08/2009  . CAROTID BRUIT 07/05/2009  . TRANSIENT DISORDER INITIATING/MAINTAINING SLEEP 09/11/2008  . GERD 01/07/2008  . OSTEOARTHRITIS 02/04/2007  . GANGLION CYST, WRIST, LEFT 02/04/2007    Medications Prior to Visit Current Outpatient Prescriptions on File Prior to Visit  Medication Sig Dispense Refill  . amLODipine (NORVASC) 5 MG tablet Take 5 mg by mouth daily.      Marland Kitchen amLODipine (NORVASC) 5 MG tablet TAKE 1 TABLET (5 MG TOTAL) BY MOUTH DAILY.  30 tablet  5  . b complex vitamins tablet Take 1 tablet by mouth daily.       . carvedilol (COREG) 3.125 MG tablet Take 1 tablet (3.125 mg total) by mouth 2 (two) times daily.  60 tablet  11  . dofetilide (TIKOSYN) 500 MCG capsule Take 1 capsule (500 mcg total) by mouth every 12 (twelve)  hours.  60 capsule  11  . finasteride (PROPECIA) 1 MG tablet Take 1 mg by mouth daily.      . fish oil-omega-3 fatty acids 1000 MG capsule Take 1 g by mouth daily.       . Flaxseed, Linseed, (RA FLAX SEED OIL 1000 PO) Take 1,000 mg by mouth 3 (three) times daily.        . furosemide (LASIX) 20 MG tablet Take 1 tablet (20 mg total) by mouth daily.  30 tablet  4  . Glucosamine Sulfate (GLUCOSAMINE RELIEF) 1000 MG TABS Take 1 tablet by mouth 2 (two) times daily.       . meloxicam (MOBIC) 15 MG tablet TAKE 1 TABLET EVERY DAY  30 tablet  5  . omeprazole (PRILOSEC) 20 MG capsule Take 20 mg by mouth daily.      Marland Kitchen telmisartan (MICARDIS) 80 MG tablet Take 80 mg by mouth daily.      . temazepam (RESTORIL) 15 MG capsule Take 1 capsule (15 mg total) by mouth at bedtime as needed for sleep.  30 capsule  1  . warfarin (COUMADIN) 5 MG tablet Take 5-7.5 mg by mouth daily. Take 5mg  daily, except take 7.5mg  Wednesdays       No current facility-administered medications on file prior to visit.    Current Medications (verified) Current Outpatient Prescriptions  Medication Sig Dispense Refill  . amLODipine (NORVASC) 5 MG tablet Take 5 mg by mouth daily.      Marland Kitchen amLODipine (NORVASC) 5 MG tablet TAKE 1 TABLET (5 MG TOTAL) BY MOUTH DAILY.  30 tablet  5  . b complex vitamins tablet Take 1 tablet by mouth daily.       . carvedilol (COREG) 3.125 MG tablet Take 1 tablet (3.125 mg total) by mouth 2 (two) times daily.  60 tablet  11  . dofetilide (TIKOSYN) 500 MCG capsule Take 1 capsule (500 mcg total) by mouth every 12 (twelve) hours.  60 capsule  11  . finasteride (PROPECIA) 1 MG tablet Take 1 mg by mouth daily.      . fish oil-omega-3 fatty acids 1000 MG capsule Take 1 g by mouth daily.       . Flaxseed, Linseed, (RA FLAX SEED OIL 1000 PO) Take 1,000 mg by mouth 3 (three) times daily.       . furosemide (LASIX) 20 MG tablet Take 1 tablet (20 mg total) by mouth daily.  30 tablet  4  . Glucosamine Sulfate (GLUCOSAMINE RELIEF) 1000 MG TABS Take 1 tablet by mouth 2 (two) times daily.       . meloxicam (MOBIC) 15 MG tablet TAKE 1 TABLET EVERY DAY  30 tablet  5  . omeprazole (PRILOSEC) 20 MG capsule Take 20 mg by mouth daily.      Marland Kitchen telmisartan (MICARDIS) 80 MG tablet Take 80 mg by mouth  daily.      . temazepam (RESTORIL) 15 MG capsule Take 1 capsule (15 mg total) by mouth at bedtime as needed for sleep.  30 capsule  1  . warfarin (COUMADIN) 5 MG tablet Take 5-7.5 mg by mouth daily. Take 5mg  daily, except take 7.5mg  Wednesdays       No current facility-administered medications for this visit.     Allergies (verified) Review of patient's allergies indicates no known allergies.   PAST HISTORY  Family History Family History  Problem Relation Age of Onset  . Stroke Father   . Lung cancer Mother 70  . Stroke  Mother   . Colon cancer Neg Hx     Social History History  Substance Use Topics  . Smoking status: Former Smoker -- 1.50 packs/day    Quit date: 05/26/1986  . Smokeless tobacco: Never Used  . Alcohol Use: 4.2 oz/week    7 Glasses of wine per week     Comment: 2 daily     Are there smokers in your home (other than you)?  No  Risk Factors Current exercise habits: Gym/ health club routine includes cardio and light weights.  Dietary issues discussed: weight loss   Cardiac risk factors: dyslipidemia, hypertension and male gender.  Depression Screen (Note: if answer to either of the following is "Yes", a more complete depression screening is indicated)   Q1: Over the past two weeks, have you felt down, depressed or hopeless? Yes  Q2: Over the past two weeks, have you felt little interest or pleasure in doing things? Yes  Have you lost interest or pleasure in daily life? No  Do you often feel hopeless? No  Do you cry easily over simple problems? No  Activities of Daily Living In your present state of health, do you have any difficulty performing the following activities?:  Driving? No Managing money?  No Feeding yourself? No Getting from bed to chair? No Climbing a flight of stairs? No Preparing food and eating?: No Bathing or showering? No Getting dressed: No Getting to the toilet? No Using the toilet:No Moving around from place to place: No In  the past year have you fallen or had a near fall?:No   Are you sexually active?  Yes  Do you have more than one partner?  No  Hearing Difficulties: No Do you often ask people to speak up or repeat themselves? No Do you experience ringing or noises in your ears? No Do you have difficulty understanding soft or whispered voices? No   Do you feel that you have a problem with memory? No  Do you often misplace items? No  Do you feel safe at home?  Yes  Cognitive Testing  Alert? Yes  Normal Appearance?Yes  Oriented to person? Yes  Place? Yes   Time? Yes  Recall of three objects?  Yes  Can perform simple calculations? Yes  Displays appropriate judgment?Yes  Can read the correct time from a watch face?Yes   Advanced Directives have been discussed with the patient? Yes   List the Names of Other Physician/Practitioners you currently use: 1.    Indicate any recent Medical Services you may have received from other than [facility] providers in the past year (date may be approximate).  Immunization History  Administered Date(s) Administered  . Influenza Split 03/04/2011, 02/04/2012  . Influenza Whole 04/17/2004, 03/31/2007, 02/17/2008, 04/12/2009, 03/13/2010  . Pneumococcal Polysaccharide 04/12/2009  . Td 04/17/2004  . Zoster 09/25/2010    Screening Tests Health Maintenance  Topic Date Due  . Influenza Vaccine  12/24/2012  . Tetanus/tdap  04/17/2014  . Colonoscopy  02/24/2016  . Pneumococcal Polysaccharide Vaccine Age 75 And Over  Completed  . Zostavax  Completed    All answers were reviewed with the patient and necessary referrals were made:  Carrie Mew, MD   02/09/2013   History reviewed: allergies, current medications, past family history, past medical history, past social history, past surgical history and problem list  Review of Systems Pertinent items are noted in HPI.    Objective:     Vision by Snellen chart: right eye:20/20, left eye:20/20 cporrected with  glasses Blood pressure 122/80, pulse 52, temperature 98.2 F (36.8 C), resp. rate 16, height 5\' 8"  (1.727 m), weight 190 lb (86.183 kg). Body mass index is 28.9 kg/(m^2).  Exam per acute visit     Assessment:      I have spent more than 30 minutes examining this patient face-to-face of which over half was spent in counseling  Patient presents for yearly preventative medicine examination.   all immunizations and health maintenance protocols were reviewed with the patient and they are up to date with these protocols.   screening laboratory values were reviewed with the patient including screening of hyperlipidemia PSA renal function and hepatic function.   There medications past medical history social history problem list and allergies were reviewed in detail.   Goals were established with regard to weight loss exercise diet in compliance with medications      Plan:     During the course of the visit the patient was educated and counseled about appropriate screening and preventive services including:    Pneumococcal vaccine   Td vaccine  Colorectal cancer screening  Diet review for nutrition referral? Yes ____  Not Indicated ____x   Patient Instructions (the written plan) was given to the patient.  Medicare Attestation I have personally reviewed: The patient's medical and social history Their use of alcohol, tobacco or illicit drugs Their current medications and supplements The patient's functional ability including ADLs,fall risks, home safety risks, cognitive, and hearing and visual impairment Diet and physical activities Evidence for depression or mood disorders  The patient's weight, height, BMI, and visual acuity have been recorded in the chart.  I have made referrals, counseling, and provided education to the patient based on review of the above and I have provided the patient with a written personalized care plan for preventive services.     Carrie Mew, MD   02/09/2013

## 2013-02-09 NOTE — Addendum Note (Signed)
Addended by: Bonnye Fava on: 02/09/2013 10:05 AM   Modules accepted: Orders

## 2013-02-20 ENCOUNTER — Other Ambulatory Visit: Payer: Self-pay | Admitting: Gastroenterology

## 2013-03-01 ENCOUNTER — Ambulatory Visit (HOSPITAL_COMMUNITY): Payer: Medicare Other | Attending: Cardiology | Admitting: Radiology

## 2013-03-01 ENCOUNTER — Other Ambulatory Visit: Payer: Self-pay

## 2013-03-01 ENCOUNTER — Other Ambulatory Visit (HOSPITAL_COMMUNITY): Payer: Self-pay | Admitting: Internal Medicine

## 2013-03-01 DIAGNOSIS — I480 Paroxysmal atrial fibrillation: Secondary | ICD-10-CM

## 2013-03-01 DIAGNOSIS — Q231 Congenital insufficiency of aortic valve: Secondary | ICD-10-CM | POA: Insufficient documentation

## 2013-03-01 DIAGNOSIS — I079 Rheumatic tricuspid valve disease, unspecified: Secondary | ICD-10-CM | POA: Insufficient documentation

## 2013-03-01 DIAGNOSIS — I428 Other cardiomyopathies: Secondary | ICD-10-CM | POA: Insufficient documentation

## 2013-03-01 DIAGNOSIS — I4891 Unspecified atrial fibrillation: Secondary | ICD-10-CM | POA: Insufficient documentation

## 2013-03-01 DIAGNOSIS — I420 Dilated cardiomyopathy: Secondary | ICD-10-CM

## 2013-03-01 DIAGNOSIS — I359 Nonrheumatic aortic valve disorder, unspecified: Secondary | ICD-10-CM

## 2013-03-01 DIAGNOSIS — I080 Rheumatic disorders of both mitral and aortic valves: Secondary | ICD-10-CM | POA: Diagnosis not present

## 2013-03-01 NOTE — Progress Notes (Signed)
Echocardiogram performed.  

## 2013-03-06 ENCOUNTER — Other Ambulatory Visit: Payer: Self-pay | Admitting: Internal Medicine

## 2013-03-15 ENCOUNTER — Encounter: Payer: Self-pay | Admitting: Cardiology

## 2013-03-15 ENCOUNTER — Ambulatory Visit (INDEPENDENT_AMBULATORY_CARE_PROVIDER_SITE_OTHER): Payer: Medicare Other | Admitting: Cardiology

## 2013-03-15 VITALS — BP 148/80 | HR 57 | Ht 68.0 in | Wt 186.0 lb

## 2013-03-15 DIAGNOSIS — I498 Other specified cardiac arrhythmias: Secondary | ICD-10-CM | POA: Diagnosis not present

## 2013-03-15 DIAGNOSIS — Z5181 Encounter for therapeutic drug level monitoring: Secondary | ICD-10-CM

## 2013-03-15 DIAGNOSIS — R001 Bradycardia, unspecified: Secondary | ICD-10-CM

## 2013-03-15 DIAGNOSIS — I4891 Unspecified atrial fibrillation: Secondary | ICD-10-CM | POA: Diagnosis not present

## 2013-03-15 DIAGNOSIS — Z79899 Other long term (current) drug therapy: Secondary | ICD-10-CM | POA: Diagnosis not present

## 2013-03-15 LAB — MAGNESIUM: Magnesium: 2 mg/dL (ref 1.5–2.5)

## 2013-03-15 LAB — BASIC METABOLIC PANEL
BUN: 11 mg/dL (ref 6–23)
CO2: 28 mEq/L (ref 19–32)
Chloride: 91 mEq/L — ABNORMAL LOW (ref 96–112)
Creatinine, Ser: 0.7 mg/dL (ref 0.4–1.5)
Glucose, Bld: 113 mg/dL — ABNORMAL HIGH (ref 70–99)
Potassium: 4.8 mEq/L (ref 3.5–5.1)

## 2013-03-15 NOTE — Progress Notes (Signed)
ELECTROPHYSIOLOGY OFFICE NOTE  Patient ID: Travis [name] MRN: [id number], DOB/AGE: 1941-01-04   Date of Visit: 03/15/2013  Primary Physician: Carrie Mew, MD Primary Cardiologist / Primary EP: [name], MD / Berton Mount, MD Reason for Visit: Hospital follow-up  History of Present Illness  Travis [name] is a 72 y.o. male with longstanding PAF s/p DCCV 12/16/2012 by Dr Jens Som who presented to the ED on 12/18/2012 with acute SOB, found to be hypoxic requiring NRB. His chest x-ray showed pulmonary edema and he was admitted for diuresis. He responded well to IV diuresis and his SOB resolved. Rythmol was discontinued in setting of HF. His beta blocker was uptitrated for better rate control. An echo was done which revealed EF 30-35%, LA dimension 47 mm. AAD therapy was recommended. After 2 day washout from Rythmol, Tikosyn was initiated on 12/22/2012 per protocol. His serum Cr, potassium level and QTc remained stable. On 12/23/2012, he converted to SR. He was discharged on 12/24/2012. He saw Dr. Graciela Husbands on 01/04/2013 and metoprolol was changed to carvedilol. He was in SR and his QTc was stable at that visit. An echo was done on 03/01/2013 revealing normal LV function. He presents today for follow-up.   Since last being seen in our clinic, he reports he is doing well and has no complaints. He states, "I feel great." He denies chest pain or shortness of breath. He denies palpitations, dizziness, near syncope or syncope. He denies LE swelling, orthopnea, PND or recent weight gain. He is compliant and tolerating medications without difficulty.  Past Medical History Past Medical History  Diagnosis Date  . Arthritis   . Hypertension   . GERD (gastroesophageal reflux disease)   . Bicuspid aortic valve   . 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 Sinus bradycardia   . Paroxysmal atrial fibrillation     On Rythmol  . Cardiomyopathy -resolved     tachycardia-induced, EF 55% June 2009   . Left bundle branch block   . History of cardioversion 12/16/2012    Past Surgical History Past Surgical History  Procedure Laterality Date  . Cerebral anuersym post clips    . Rotator cuff repair      lf  . Fractured left arm    . Tonsillectomy    . Left tendon repair      lft foot  . Hernia repair      lft  . Wrist ganglion excision      lft  . Tonsillectomy    . Inguinal hernia repair  07/02/2011    Procedure: LAPAROSCOPIC INGUINAL HERNIA;  Surgeon: Shelly Rubenstein, MD;  Location: [facility] OR;  Service: General;  Laterality: Left;  Laparoscopic left inguinal hernia repair and mesh  . Cardiac catheterization    . Cardioversion N/A 12/16/2012    Procedure: CARDIOVERSION;  Surgeon: Lewayne Bunting, MD;  Location: [facility] ENDOSCOPY;  Service: Cardiovascular;  Laterality: N/A;    Allergies/Intolerances No Known Allergies  Current Home Medications Current Outpatient Prescriptions  Medication Sig Dispense Refill  . amLODipine (NORVASC) 5 MG tablet Take 5 mg by mouth daily.      Marland Kitchen amLODipine (NORVASC) 5 MG tablet TAKE 1 TABLET (5 MG TOTAL) BY MOUTH DAILY.  30 tablet  5  . b complex vitamins tablet Take 1 tablet by mouth daily.       . carvedilol (COREG) 3.125 MG tablet Take 1 tablet (3.125 mg total) by mouth 2 (two) times daily.  60 tablet  11  .  dofetilide (TIKOSYN) 500 MCG capsule Take 1 capsule (500 mcg total) by mouth every 12 (twelve) hours.  60 capsule  11  . finasteride (PROPECIA) 1 MG tablet Take 1 mg by mouth daily.      . fish oil-omega-3 fatty acids 1000 MG capsule Take 1 g by mouth daily.       . Flaxseed, Linseed, (RA FLAX SEED OIL 1000 PO) Take 1,000 mg by mouth 3 (three) times daily.       . furosemide (LASIX) 20 MG tablet Take 1 tablet (20 mg total) by mouth daily.  30 tablet  4  . Glucosamine Sulfate (GLUCOSAMINE RELIEF) 1000 MG TABS Take 1 tablet by mouth 2 (two) times daily.       . meloxicam (MOBIC) 15 MG tablet TAKE 1 TABLET EVERY DAY  30 tablet  5  . omeprazole  (PRILOSEC) 20 MG capsule Take 20 mg by mouth daily.      Marland Kitchen omeprazole (PRILOSEC) 20 MG capsule TAKE 1 CAPSULE EVERY DAY  30 capsule  1  . telmisartan (MICARDIS) 80 MG tablet Take 80 mg by mouth daily.      . temazepam (RESTORIL) 15 MG capsule TAKE 1 TABLET AT BEDTIME AS NEEDED FOR SLEEP  30 capsule  3  . warfarin (COUMADIN) 5 MG tablet Take 5-7.5 mg by mouth daily. Take 5mg  daily, except take 7.5mg  Wednesdays       No current facility-administered medications for this visit.    Social History History   Social History  . Marital Status: Single    Spouse Name: N/A    Number of Children: 0  . Years of Education: N/A   Occupational History  . Retired    Social History Main Topics  . Smoking status: Former Smoker -- 1.50 packs/day    Quit date: 05/26/1986  . Smokeless tobacco: Never Used  . Alcohol Use: 4.2 oz/week    7 Glasses of wine per week     Comment: 2 daily   . Drug Use: No  . Sexual Activity: Yes   Other Topics Concern  . Not on file   Social History Narrative   0 caffeine drinks daily      Review of Systems General: No chills, fever, night sweats or weight changes Cardiovascular: No chest pain, dyspnea on exertion, edema, orthopnea, palpitations, paroxysmal nocturnal dyspnea Dermatological: No rash, lesions or masses Respiratory: No cough, dyspnea Urologic: No hematuria, dysuria Abdominal: No nausea, vomiting, diarrhea, bright red blood per rectum, melena, or hematemesis Neurologic: No visual changes, weakness, changes in mental status All other systems reviewed and are otherwise negative except as noted above.  Physical Exam Vitals: Blood pressure 148/80, pulse 57, height 5\' 8"  (1.727 m), weight 186 lb (84.369 kg), SpO2 95.00%.  General: Well developed, well appearing 72 y.o. male in no acute distress. HEENT: Normocephalic, atraumatic. EOMs intact. Sclera nonicteric. Oropharynx clear.  Neck: Supple without bruits. No JVD. Lungs: Respirations regular and  unlabored, CTA bilaterally. No wheezes, rales or rhonchi. Heart: RRR. S1, S2 present. No murmurs, rub, S3 or S4. Abdomen: Soft, non-distended.  Extremities: No clubbing, cyanosis or edema. PT/Radials 2+ and equal bilaterally. Psych: Normal affect. Neuro: Alert and oriented X 3. Moves all extremities spontaneously.   Diagnostics Echocardiogram 03/01/2013 Study Conclusions - Left ventricle: The cavity size was mildly dilated. Wall thickness was normal. Systolic function was normal. The estimated ejection fraction was in the range of 50% to 55%. Wall motion was normal; there were no regional wall motion abnormalities.  Left ventricular diastolic function parameters were normal. - Aortic valve: Possibly bicuspid. There was mild stenosis. Mild regurgitation. Mean gradient: 11mm Hg (S). Peak gradient: 20mm Hg (S). - Mitral valve: Mild regurgitation. - Left atrium: The atrium was moderately dilated. - Right ventricle: The cavity size was mildly dilated. - Right atrium: The atrium was mildly dilated. - Atrial septum: No defect or patent foramen ovale was identified. - Pulmonary arteries: PA peak pressure: 39mm Hg (S). 12-lead ECG today  - sinus bradycardia at 48 bpm with LBBB; manual QTc 432 msec  Assessment and Plan 1. Atrial fibrillation - maintaining SR on Tikosyn  - QTc stable  - check BMET and Mg today  - if K, Mg and CrCl remain stable, continue Tikosyn 500 mcg every 12 hours  - follow-up with Dr. Graciela Husbands in 4 months 2. Sinus bradycardia - asymptomatic  Signed, Shaivi Rothschild, PA-C 03/15/2013, 10:59 AM

## 2013-03-15 NOTE — Patient Instructions (Signed)
Your physician wants you to follow-up in: 4 MONTHS WITH DR. Logan Bores will receive a reminder letter in the mail two months in advance. If you don't receive a letter, please call our office to schedule the follow-up appointment.  Your physician recommends that you continue on your current medications as directed. Please refer to the Current Medication list given to you today.  Your physician recommends that you return for lab work in: TODAY (BMET AND MAGNESIUM)

## 2013-03-16 ENCOUNTER — Telehealth: Payer: Self-pay | Admitting: Internal Medicine

## 2013-03-16 NOTE — Telephone Encounter (Signed)
Follow Up ° °Pt calling for results °

## 2013-03-16 NOTE — Telephone Encounter (Signed)
Pt aware of lab results and PA's recommendations.

## 2013-03-24 ENCOUNTER — Ambulatory Visit (INDEPENDENT_AMBULATORY_CARE_PROVIDER_SITE_OTHER): Payer: Medicare Other | Admitting: General Practice

## 2013-03-24 DIAGNOSIS — I420 Dilated cardiomyopathy: Secondary | ICD-10-CM

## 2013-03-24 DIAGNOSIS — I447 Left bundle-branch block, unspecified: Secondary | ICD-10-CM | POA: Diagnosis not present

## 2013-03-24 DIAGNOSIS — I428 Other cardiomyopathies: Secondary | ICD-10-CM

## 2013-04-24 ENCOUNTER — Other Ambulatory Visit: Payer: Self-pay | Admitting: Gastroenterology

## 2013-04-27 ENCOUNTER — Encounter: Payer: Self-pay | Admitting: *Deleted

## 2013-04-28 ENCOUNTER — Ambulatory Visit (INDEPENDENT_AMBULATORY_CARE_PROVIDER_SITE_OTHER): Payer: Medicare Other | Admitting: General Practice

## 2013-04-28 DIAGNOSIS — I4891 Unspecified atrial fibrillation: Secondary | ICD-10-CM | POA: Diagnosis not present

## 2013-04-28 DIAGNOSIS — I420 Dilated cardiomyopathy: Secondary | ICD-10-CM

## 2013-04-28 DIAGNOSIS — I428 Other cardiomyopathies: Secondary | ICD-10-CM | POA: Diagnosis not present

## 2013-04-28 DIAGNOSIS — I447 Left bundle-branch block, unspecified: Secondary | ICD-10-CM | POA: Diagnosis not present

## 2013-04-28 NOTE — Progress Notes (Signed)
Pre-visit discussion using our clinic review tool. No additional management support is needed unless otherwise documented below in the visit note.  

## 2013-05-02 ENCOUNTER — Encounter: Payer: Self-pay | Admitting: Internal Medicine

## 2013-05-02 DIAGNOSIS — L259 Unspecified contact dermatitis, unspecified cause: Secondary | ICD-10-CM | POA: Diagnosis not present

## 2013-05-02 DIAGNOSIS — L821 Other seborrheic keratosis: Secondary | ICD-10-CM | POA: Diagnosis not present

## 2013-05-02 DIAGNOSIS — L570 Actinic keratosis: Secondary | ICD-10-CM | POA: Diagnosis not present

## 2013-05-02 DIAGNOSIS — D239 Other benign neoplasm of skin, unspecified: Secondary | ICD-10-CM | POA: Diagnosis not present

## 2013-06-07 ENCOUNTER — Ambulatory Visit (INDEPENDENT_AMBULATORY_CARE_PROVIDER_SITE_OTHER): Payer: Medicare Other | Admitting: Gastroenterology

## 2013-06-07 ENCOUNTER — Encounter: Payer: Self-pay | Admitting: Gastroenterology

## 2013-06-07 VITALS — BP 110/70 | HR 60 | Ht 67.0 in | Wt 182.2 lb

## 2013-06-07 DIAGNOSIS — R195 Other fecal abnormalities: Secondary | ICD-10-CM | POA: Diagnosis not present

## 2013-06-07 DIAGNOSIS — K219 Gastro-esophageal reflux disease without esophagitis: Secondary | ICD-10-CM

## 2013-06-07 MED ORDER — OMEPRAZOLE 20 MG PO CPDR: 20.0000 mg | DELAYED_RELEASE_CAPSULE | Freq: Every day | ORAL | Status: DC

## 2013-06-07 MED ORDER — LOPERAMIDE HCL 2 MG PO TABS: ORAL_TABLET | ORAL | Status: DC

## 2013-06-07 NOTE — Progress Notes (Signed)
Review of pertinent gastrointestinal problems:  1. Routine risk for colon cancer. Last colonoscopy October 2007 found diverticulosis, no polyps or cancers. Next colonoscopy October 2017.  2. personal history of gastric ulcer, likely NSAID related. Fall 2007 had 2 endoscopies, the second prove that the previous gastric ulcer had healed.  3. GERD, well treated with once daily proton pump inhibitor  HPI: This is a very pleasant 73 year old man whom I last saw about 2 years ago.  Feels fine.  No problems.  He has no dysphasia, no GERD, no significant epigastric or abdominal pains, no overt GI bleeding.  He was in hosp this past summer with cardiac rhythm issues.  Stayed about a week on new medicine.  Many changes to his meds. Since then he's had looser that usual stools.  No GI symptoms otherwise.  HE is trying to lose weight.  tikosyn and coreg were started.  #3 side effect of choroid is diarrhea  No blood.  Often unformed but not frequent.    Past Medical History  Diagnosis Date  . Arthritis   . Hypertension   . GERD (gastroesophageal reflux disease)   . Bicuspid aortic valve   . 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 Sinus bradycardia   . Paroxysmal atrial fibrillation     On Rythmol  . Cardiomyopathy -resolved     tachycardia-induced, EF 55% June 2009  . Left bundle branch block   . History of cardioversion 12/16/2012    Past Surgical History  Procedure Laterality Date  . Cerebral anuersym post clips    . Rotator cuff repair      lf  . Fractured left arm    . Tonsillectomy    . Left tendon repair      lft foot  . Hernia repair      lft  . Wrist ganglion excision      lft  . Tonsillectomy    . Inguinal hernia repair  07/02/2011    Procedure: LAPAROSCOPIC INGUINAL HERNIA;  Surgeon: Harl Bowie, MD;  Location: [place];  Service: General;  Laterality: Left;  Laparoscopic left inguinal hernia repair and mesh  . Cardiac catheterization    . Cardioversion N/A  12/16/2012    Procedure: CARDIOVERSION;  Surgeon: Lelon Perla, MD;  [facility] ENDOSCOPY;  Service: Cardiovascular;  Laterality: N/A;    Current Outpatient Prescriptions  Medication Sig Dispense Refill  . amLODipine (NORVASC) 5 MG tablet Take 5 mg by mouth daily.      Marland Kitchen amLODipine (NORVASC) 5 MG tablet TAKE 1 TABLET (5 MG TOTAL) BY MOUTH DAILY.  30 tablet  5  . b complex vitamins tablet Take 1 tablet by mouth daily.       . carvedilol (COREG) 3.125 MG tablet Take 1 tablet (3.125 mg total) by mouth 2 (two) times daily.  60 tablet  11  . dofetilide (TIKOSYN) 500 MCG capsule Take 1 capsule (500 mcg total) by mouth every 12 (twelve) hours.  60 capsule  11  . finasteride (PROPECIA) 1 MG tablet Take 1 mg by mouth daily.      . fish oil-omega-3 fatty acids 1000 MG capsule Take 1 g by mouth daily.       . Flaxseed, Linseed, (RA FLAX SEED OIL 1000 PO) Take 1,000 mg by mouth 3 (three) times daily.       . furosemide (LASIX) 20 MG tablet Take 1 tablet (20 mg total) by mouth daily.  30 tablet  4  .  Glucosamine Sulfate (GLUCOSAMINE RELIEF) 1000 MG TABS Take 1 tablet by mouth 2 (two) times daily.       . meloxicam (MOBIC) 15 MG tablet TAKE 1 TABLET EVERY DAY  30 tablet  5  . omeprazole (PRILOSEC) 20 MG capsule Take 20 mg by mouth daily.      Marland Kitchen omeprazole (PRILOSEC) 20 MG capsule TAKE 1 CAPSULE EVERY DAY  30 capsule  1  . telmisartan (MICARDIS) 80 MG tablet Take 80 mg by mouth daily.      . temazepam (RESTORIL) 15 MG capsule TAKE 1 TABLET AT BEDTIME AS NEEDED FOR SLEEP  30 capsule  3  . warfarin (COUMADIN) 5 MG tablet Take 5-7.5 mg by mouth daily. Take 5mg  daily, except take 7.5mg  Wednesdays       No current facility-administered medications for this visit.    Allergies as of 06/07/2013  . (No Known Allergies)    Family History  Problem Relation Age of Onset  . Stroke Father   . Lung cancer Mother 46  . Stroke Mother   . Colon cancer Neg Hx     History   Social History  . Marital  Status: Single    Spouse Name: N/A    Number of Children: 0  . Years of Education: N/A   Occupational History  . Retired    Social History Main Topics  . Smoking status: Former Smoker -- 1.50 packs/day    Quit date: 05/26/1986  . Smokeless tobacco: Never Used  . Alcohol Use: 4.2 oz/week    7 Glasses of wine per week     Comment: 2 daily   . Drug Use: No  . Sexual Activity: Yes   Other Topics Concern  . Not on file   Social History Narrative   0 caffeine drinks daily       Physical Exam: BP 110/70  Pulse 60  Ht 5\' 7"  (1.702 m)  Wt 182 lb 3.2 oz (82.645 kg)  BMI 28.53 kg/m2 Constitutional: generally well-appearing Psychiatric: alert and oriented x3 Abdomen: soft, nontender, nondistended, no obvious ascites, no peritoneal signs, normal bowel sounds     Assessment and plan: 73 y.o. male with chronic GERD without alarm symptoms, change in his bowels recently after starting new cardiac medicines  He noticed loosening of his stools since starting some cardiac medicines. The #3 side effect corrugators diarrhea and I feel that its timing is certainly interesting here. I see no reason for GI workup. The changes not really much more than a bother to him I recommended he try one half of an Imodium pill every morning. I have also refilled his proton pump inhibitor for another year. I am happy to refill this in another year from now but I would like him to return in 2 years if he continues to need prescription proton pump inhibitor.

## 2013-06-07 NOTE — Patient Instructions (Addendum)
Start 1/2 imodium pill every morning.  You can increase to full pill if needed.  Stop if you become constipated. Refill on omeprazole 20mg  pills. Take one pill daily.  Return to see Dr. Ardis Hughs in 2 years for refills if needed. Call if any troubles.

## 2013-06-09 ENCOUNTER — Ambulatory Visit (INDEPENDENT_AMBULATORY_CARE_PROVIDER_SITE_OTHER): Payer: Medicare Other | Admitting: General Practice

## 2013-06-09 DIAGNOSIS — I4891 Unspecified atrial fibrillation: Secondary | ICD-10-CM

## 2013-06-09 LAB — POCT INR: INR: 3.8

## 2013-06-09 NOTE — Progress Notes (Signed)
Pre-visit discussion using our clinic review tool. No additional management support is needed unless otherwise documented below in the visit note.  

## 2013-06-15 ENCOUNTER — Ambulatory Visit: Payer: Medicare Other | Admitting: Internal Medicine

## 2013-06-22 ENCOUNTER — Ambulatory Visit (INDEPENDENT_AMBULATORY_CARE_PROVIDER_SITE_OTHER): Payer: Medicare Other | Admitting: Internal Medicine

## 2013-06-22 ENCOUNTER — Encounter: Payer: Self-pay | Admitting: Internal Medicine

## 2013-06-22 VITALS — BP 130/80 | HR 72 | Temp 98.6°F | Resp 16 | Ht 67.0 in | Wt 180.0 lb

## 2013-06-22 DIAGNOSIS — I480 Paroxysmal atrial fibrillation: Secondary | ICD-10-CM

## 2013-06-22 DIAGNOSIS — I10 Essential (primary) hypertension: Secondary | ICD-10-CM | POA: Diagnosis not present

## 2013-06-22 DIAGNOSIS — Q231 Congenital insufficiency of aortic valve: Secondary | ICD-10-CM | POA: Diagnosis not present

## 2013-06-22 DIAGNOSIS — I4891 Unspecified atrial fibrillation: Secondary | ICD-10-CM

## 2013-06-22 MED ORDER — ALPRAZOLAM 0.25 MG PO TABS: 0.2500 mg | ORAL_TABLET | Freq: Two times a day (BID) | ORAL | Status: DC | PRN

## 2013-06-22 NOTE — Progress Notes (Signed)
Pre visit review using our clinic review tool, if applicable. No additional management support is needed unless otherwise documented below in the visit note. 

## 2013-06-22 NOTE — Patient Instructions (Addendum)
The patient is instructed to continue all medications as prescribed. Schedule followup with check out clerk upon leaving the clinic Stop the prilosec and replace with the dexilant Stop the immodium If the loose stool do not return call me to continue the dexilant  If they come back then it is only the carvedilol  And i would keep on the lowest dose of imodium you can take   Then we can adjust the warferin

## 2013-06-22 NOTE — Progress Notes (Signed)
Subjective:    Patient ID: Travis [name], male    DOB: August 20, 1940, 73 y.o.   MRN: [id number]  HPI Loose stools after medication changes On coreg Has added immodium But the protime was effected by the immodium And prilosec    Review of Systems  Constitutional: Negative for fever and fatigue.  HENT: Negative for congestion, hearing loss and postnasal drip.   Eyes: Negative for discharge, redness and visual disturbance.  Respiratory: Negative for cough, shortness of breath and wheezing.   Cardiovascular: Negative for leg swelling.  Gastrointestinal: Negative for abdominal pain, constipation and abdominal distention.  Genitourinary: Negative for urgency and frequency.  Musculoskeletal: Negative for arthralgias, joint swelling and neck pain.  Skin: Negative for color change and rash.  Neurological: Negative for weakness and light-headedness.  Hematological: Negative for adenopathy.  Psychiatric/Behavioral: Negative for behavioral problems.   Past Medical History  Diagnosis Date  . Arthritis   . Hypertension   . GERD (gastroesophageal reflux disease)   . Bicuspid aortic valve   . 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 Sinus bradycardia   . Paroxysmal atrial fibrillation     On Rythmol  . Cardiomyopathy -resolved     tachycardia-induced, EF 55% June 2009  . Left bundle branch block   . History of cardioversion 12/16/2012    History   Social History  . Marital Status: Single    Spouse Name: N/A    Number of Children: 0  . Years of Education: N/A   Occupational History  . Retired    Social History Main Topics  . Smoking status: Former Smoker -- 1.50 packs/day    Quit date: 05/26/1986  . Smokeless tobacco: Never Used  . Alcohol Use: 4.2 oz/week    7 Glasses of wine per week     Comment: 2 daily   . Drug Use: No  . Sexual Activity: Yes   Other Topics Concern  . Not on file   Social History Narrative   0 caffeine drinks daily     Past Surgical  History  Procedure Laterality Date  . Cerebral anuersym post clips    . Rotator cuff repair      lf  . Fractured left arm    . Tonsillectomy    . Left tendon repair      lft foot  . Hernia repair      lft  . Wrist ganglion excision      lft  . Tonsillectomy    . Inguinal hernia repair  07/02/2011    Procedure: LAPAROSCOPIC INGUINAL HERNIA;  Surgeon: Harl Bowie, MD;  Location: [place];  Service: General;  Laterality: Left;  Laparoscopic left inguinal hernia repair and mesh  . Cardiac catheterization    . Cardioversion N/A 12/16/2012    Procedure: CARDIOVERSION;  Surgeon: Lelon Perla, MD;  Location: [facility] ENDOSCOPY;  Service: Cardiovascular;  Laterality: N/A;    Family History  Problem Relation Age of Onset  . Stroke Father   . Lung cancer Mother 69  . Stroke Mother   . Colon cancer Neg Hx     No Known Allergies  Current Outpatient Prescriptions on File Prior to Visit  Medication Sig Dispense Refill  . amLODipine (NORVASC) 5 MG tablet TAKE 1 TABLET (5 MG TOTAL) BY MOUTH DAILY.  30 tablet  5  . b complex vitamins tablet Take 1 tablet by mouth daily.       . carvedilol (COREG) 3.125 MG tablet Take 1  tablet (3.125 mg total) by mouth 2 (two) times daily.  60 tablet  11  . dofetilide (TIKOSYN) 500 MCG capsule Take 1 capsule (500 mcg total) by mouth every 12 (twelve) hours.  60 capsule  11  . finasteride (PROPECIA) 1 MG tablet Take 1 mg by mouth daily.      . fish oil-omega-3 fatty acids 1000 MG capsule Take 1 g by mouth daily.       . Flaxseed, Linseed, (RA FLAX SEED OIL 1000 PO) Take 1,000 mg by mouth 3 (three) times daily.       . furosemide (LASIX) 20 MG tablet Take 1 tablet (20 mg total) by mouth daily.  30 tablet  4  . Glucosamine Sulfate (GLUCOSAMINE RELIEF) 1000 MG TABS Take 1 tablet by mouth 2 (two) times daily.       Marland Kitchen loperamide (IMODIUM A-D) 2 MG tablet Take 1/2 tablet by mouth every morning  1 tablet  0  . meloxicam (MOBIC) 15 MG tablet TAKE 1 TABLET EVERY DAY  30  tablet  5  . omeprazole (PRILOSEC) 20 MG capsule Take 1 capsule (20 mg total) by mouth daily.  30 capsule  11  . telmisartan (MICARDIS) 80 MG tablet Take 80 mg by mouth daily.      . temazepam (RESTORIL) 15 MG capsule TAKE 1 TABLET AT BEDTIME AS NEEDED FOR SLEEP  30 capsule  3  . warfarin (COUMADIN) 5 MG tablet Take 5-7.5 mg by mouth daily. Take 5mg  daily, except take 7.5mg  Wednesdays       No current facility-administered medications on file prior to visit.    BP 130/80  Pulse 72  Temp(Src) 98.6 F (37 C)  Resp 16  Ht 5\' 7"  (1.702 m)  Wt 180 lb (81.647 kg)  BMI 28.19 kg/m2        Objective:   Physical Exam  Constitutional: He appears well-developed and well-nourished.  HENT:  Head: Normocephalic and atraumatic.  Eyes: Conjunctivae are normal. Pupils are equal, round, and reactive to light.  Neck: Normal range of motion. Neck supple.  Cardiovascular: Normal rate and regular rhythm.   Pulmonary/Chest: Effort normal and breath sounds normal.  Abdominal: Soft. Bowel sounds are normal.  Musculoskeletal:  Aspect of the third toe small ganglion cyst          Assessment & Plan:  Change to Prilosec to prevacid to see if this influences the loose stools Interaction of Imodium and Coumadin noted and if he is to stay on Imodium he will need to monitor Coumadin more closely for the short-term to adjust the dose appropriately.  Stable hyponatremia stable atrial fibrillation   Reviewed moderation of alcohol Stable murmur with history of bicuspid aortic valve

## 2013-06-23 ENCOUNTER — Telehealth: Payer: Self-pay | Admitting: Internal Medicine

## 2013-06-23 NOTE — Telephone Encounter (Signed)
Relevant patient education assigned to patient using Emmi. ° °

## 2013-06-24 DIAGNOSIS — M25569 Pain in unspecified knee: Secondary | ICD-10-CM | POA: Diagnosis not present

## 2013-06-25 ENCOUNTER — Other Ambulatory Visit: Payer: Self-pay | Admitting: Internal Medicine

## 2013-07-04 ENCOUNTER — Other Ambulatory Visit: Payer: Self-pay | Admitting: Internal Medicine

## 2013-07-04 ENCOUNTER — Other Ambulatory Visit: Payer: Self-pay | Admitting: Cardiology

## 2013-07-07 ENCOUNTER — Ambulatory Visit: Payer: Medicare Other

## 2013-07-13 ENCOUNTER — Telehealth: Payer: Self-pay | Admitting: Internal Medicine

## 2013-07-13 MED ORDER — DEXLANSOPRAZOLE 60 MG PO CPDR: 60.0000 mg | DELAYED_RELEASE_CAPSULE | Freq: Every day | ORAL | Status: DC

## 2013-07-13 NOTE — Telephone Encounter (Signed)
Pt given samples of dexilant 60 mg. Pt states it works and now need rx. 1/day Pharm: [facility]/ [address]

## 2013-07-13 NOTE — Telephone Encounter (Signed)
rx sent in electroncially 

## 2013-07-14 ENCOUNTER — Ambulatory Visit (INDEPENDENT_AMBULATORY_CARE_PROVIDER_SITE_OTHER): Payer: Medicare Other | Admitting: Internal Medicine

## 2013-07-14 ENCOUNTER — Encounter: Payer: Self-pay | Admitting: Internal Medicine

## 2013-07-14 VITALS — BP 155/88 | HR 50 | Ht 68.0 in | Wt 181.0 lb

## 2013-07-14 DIAGNOSIS — I428 Other cardiomyopathies: Secondary | ICD-10-CM | POA: Diagnosis not present

## 2013-07-14 DIAGNOSIS — I4891 Unspecified atrial fibrillation: Secondary | ICD-10-CM

## 2013-07-14 DIAGNOSIS — I429 Cardiomyopathy, unspecified: Secondary | ICD-10-CM

## 2013-07-14 NOTE — Patient Instructions (Addendum)
Your physician recommends that you continue on your current medications as directed. Please refer to the Current Medication list given to you today.  Your physician recommends that you return for lab work today: Magnesium/Potassium  Your physician wants you to follow-up in: 6 months [name].  You will receive a reminder letter in the mail two months in advance. If you don't receive a letter, please call our office to schedule the follow-up appointment.

## 2013-07-14 NOTE — Progress Notes (Signed)
Patient Care Team: Ricard Dillon, MD as PCP - General   HPI  Travis [name] is a 73 y.o. male Seen in followup for paroxysmal atrial fibrillation and tachycardia-induced and resolved cardiomyopathy seen in followup today.   He ended up with congestive heart failure 7/14 with an ejection fraction of 35%. Propafenone was discontinued and dofetilide was initiated. Echocardiogram 10/14 demonstrated normalization of LV function.  The patient denies chest pain, shortness of breath, nocturnal dyspnea, orthopnea or peripheral edema.  There have been no palpitations, lightheadedness or syncope.   He also has a probable bicuspid aortic valve with mild stenosis        Past Medical History  Diagnosis Date  . Arthritis   . Hypertension   . GERD (gastroesophageal reflux disease)   . Bicuspid aortic valve   . 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 Sinus bradycardia   . Paroxysmal atrial fibrillation     On Rythmol  . Cardiomyopathy -resolved     tachycardia-induced, EF 55% June 2009  . Left bundle branch block   . History of cardioversion 12/16/2012    Past Surgical History  Procedure Laterality Date  . Cerebral anuersym post clips    . Rotator cuff repair      lf  . Fractured left arm    . Tonsillectomy    . Left tendon repair      lft foot  . Hernia repair      lft  . Wrist ganglion excision      lft  . Tonsillectomy    . Inguinal hernia repair  07/02/2011    Procedure: LAPAROSCOPIC INGUINAL HERNIA;  Surgeon: Harl Bowie, MD;  Location: [place];  Service: General;  Laterality: Left;  Laparoscopic left inguinal hernia repair and mesh  . Cardiac catheterization    . Cardioversion N/A 12/16/2012    Procedure: CARDIOVERSION;  Surgeon: Lelon Perla, MD;  Location: [facility] ENDOSCOPY;  Service: Cardiovascular;  Laterality: N/A;    Current Outpatient Prescriptions  Medication Sig Dispense Refill  . ALPRAZolam (XANAX) 0.25 MG tablet Take 1 tablet (0.25 mg total) by mouth  2 (two) times daily as needed for anxiety.  30 tablet  0  . amLODipine (NORVASC) 5 MG tablet TAKE 1 TABLET (5 MG TOTAL) BY MOUTH DAILY.  30 tablet  5  . b complex vitamins tablet Take 1 tablet by mouth daily.       . carvedilol (COREG) 3.125 MG tablet Take 1 tablet (3.125 mg total) by mouth 2 (two) times daily.  60 tablet  11  . dexlansoprazole (DEXILANT) 60 MG capsule Take 1 capsule (60 mg total) by mouth daily.  30 capsule  0  . dofetilide (TIKOSYN) 500 MCG capsule Take 1 capsule (500 mcg total) by mouth every 12 (twelve) hours.  60 capsule  11  . finasteride (PROPECIA) 1 MG tablet Take 1 mg by mouth daily.      . fish oil-omega-3 fatty acids 1000 MG capsule Take 1 g by mouth daily.       . Flaxseed, Linseed, (RA FLAX SEED OIL 1000 PO) Take 1,000 mg by mouth 3 (three) times daily.       . furosemide (LASIX) 20 MG tablet TAKE ONE TABLET BY MOUTH EVERY DAY  30 tablet  0  . Glucosamine Sulfate (GLUCOSAMINE RELIEF) 1000 MG TABS Take 1 tablet by mouth 2 (two) times daily.       . meloxicam (MOBIC) 15 MG tablet TAKE 1 TABLET  EVERY DAY  30 tablet  5  . telmisartan (MICARDIS) 80 MG tablet Take 80 mg by mouth daily.      . temazepam (RESTORIL) 15 MG capsule TAKE 1 CAPSULE BY MOUTH EVERY NIGHT AT BEDTIME  30 capsule  3  . warfarin (COUMADIN) 5 MG tablet Take 5-7.5 mg by mouth daily. Take 5mg  daily, except take 7.5mg  Wednesdays       No current facility-administered medications for this visit.    No Known Allergies  Review of Systems negative except from HPI and PMH  Physical Exam BP 155/88  Pulse 50  Ht 5\' 8"  (1.727 m)  Wt 181 lb (82.101 kg)  BMI 27.53 kg/m2 Well developed and well nourished in no acute distress HENT normal E scleral and icterus clear Neck Supple JVP flat; carotids brisk and full Clear to ausculation  slow Regular rate and rhythm, no murmurs gallops or rub Soft with active bowel sounds No clubbing cyanosis none Edema Alert and oriented, grossly normal motor and sensory  function Skin Warm and Dry  ECG demonstrates a normal QT intervals heart rate of 43  Assessment and  Plan  Atrial fibrillation-paroxysmal holding sinus rhythm on dofetilide. We'll check his surveillance laboratories today.continue warfarin  Cardiomyopathy-tachycardia mediated-resolved Continue beta blockers and ARB  Bicuspid aortic valve-repeat echo next year

## 2013-07-24 ENCOUNTER — Other Ambulatory Visit: Payer: Self-pay | Admitting: Internal Medicine

## 2013-07-28 ENCOUNTER — Ambulatory Visit (INDEPENDENT_AMBULATORY_CARE_PROVIDER_SITE_OTHER): Payer: Medicare Other | Admitting: Family

## 2013-07-28 DIAGNOSIS — Z5181 Encounter for therapeutic drug level monitoring: Secondary | ICD-10-CM

## 2013-07-28 DIAGNOSIS — I10 Essential (primary) hypertension: Secondary | ICD-10-CM | POA: Diagnosis not present

## 2013-07-28 DIAGNOSIS — I447 Left bundle-branch block, unspecified: Secondary | ICD-10-CM

## 2013-07-28 DIAGNOSIS — I420 Dilated cardiomyopathy: Secondary | ICD-10-CM

## 2013-07-28 DIAGNOSIS — I4891 Unspecified atrial fibrillation: Secondary | ICD-10-CM | POA: Diagnosis not present

## 2013-07-28 LAB — BASIC METABOLIC PANEL
BUN: 16 mg/dL (ref 6–23)
CO2: 28 mEq/L (ref 19–32)
CREATININE: 0.7 mg/dL (ref 0.4–1.5)
Calcium: 8.9 mg/dL (ref 8.4–10.5)
Chloride: 96 mEq/L (ref 96–112)
GFR: 110.14 mL/min (ref >60.00)
Glucose, Bld: 97 mg/dL (ref 70–99)
Potassium: 4.8 mEq/L (ref 3.5–5.1)
Sodium: 129 mEq/L — ABNORMAL LOW (ref 135–145)

## 2013-07-28 LAB — POCT INR: INR: 2

## 2013-07-28 LAB — MAGNESIUM: MAGNESIUM: 1.9 mg/dL (ref 1.5–2.5)

## 2013-07-28 NOTE — Patient Instructions (Signed)
Anticoagulation Dose Instructions as of 07/28/2013     Travis [name] Tue Wed Thu Fri Sat   New Dose 5 mg 5 mg 5 mg 7.5 mg 5 mg 5 mg 5 mg    Description       Continue to take 1 tablet every day except 1 1/2 tablet on Wednesdays.  Re-check in 10 days due to med change.

## 2013-07-29 ENCOUNTER — Other Ambulatory Visit: Payer: Self-pay | Admitting: Internal Medicine

## 2013-07-31 ENCOUNTER — Other Ambulatory Visit: Payer: Self-pay | Admitting: Internal Medicine

## 2013-08-01 ENCOUNTER — Telehealth: Payer: Self-pay | Admitting: Internal Medicine

## 2013-08-01 MED ORDER — TELMISARTAN 80 MG PO TABS: 80.0000 mg | ORAL_TABLET | Freq: Every day | ORAL | Status: DC

## 2013-08-01 NOTE — Telephone Encounter (Signed)
Pt needs refill on telmisartan 80  Mg #30 w/refills sent [facility] [address]

## 2013-08-01 NOTE — Telephone Encounter (Signed)
rx sent in electronically 

## 2013-08-08 ENCOUNTER — Ambulatory Visit (INDEPENDENT_AMBULATORY_CARE_PROVIDER_SITE_OTHER): Payer: Medicare Other | Admitting: General Practice

## 2013-08-08 DIAGNOSIS — I4891 Unspecified atrial fibrillation: Secondary | ICD-10-CM

## 2013-08-08 DIAGNOSIS — I480 Paroxysmal atrial fibrillation: Secondary | ICD-10-CM

## 2013-08-08 DIAGNOSIS — Z5181 Encounter for therapeutic drug level monitoring: Secondary | ICD-10-CM | POA: Diagnosis not present

## 2013-08-08 DIAGNOSIS — Z79899 Other long term (current) drug therapy: Secondary | ICD-10-CM | POA: Diagnosis not present

## 2013-08-08 DIAGNOSIS — I272 Pulmonary hypertension, unspecified: Secondary | ICD-10-CM

## 2013-08-08 DIAGNOSIS — I2789 Other specified pulmonary heart diseases: Secondary | ICD-10-CM

## 2013-08-08 LAB — POCT INR: INR: 3.4

## 2013-08-08 NOTE — Progress Notes (Signed)
Pre visit review using our clinic review tool, if applicable. No additional management support is needed unless otherwise documented below in the visit note. 

## 2013-08-16 ENCOUNTER — Other Ambulatory Visit (HOSPITAL_COMMUNITY): Payer: Self-pay | Admitting: Cardiology

## 2013-08-16 DIAGNOSIS — I6529 Occlusion and stenosis of unspecified carotid artery: Secondary | ICD-10-CM

## 2013-08-22 DIAGNOSIS — M25569 Pain in unspecified knee: Secondary | ICD-10-CM | POA: Diagnosis not present

## 2013-08-25 ENCOUNTER — Ambulatory Visit (HOSPITAL_COMMUNITY): Payer: Medicare Other | Attending: Internal Medicine | Admitting: Cardiology

## 2013-08-25 ENCOUNTER — Other Ambulatory Visit: Payer: Self-pay | Admitting: Internal Medicine

## 2013-08-25 DIAGNOSIS — R0989 Other specified symptoms and signs involving the circulatory and respiratory systems: Secondary | ICD-10-CM | POA: Diagnosis not present

## 2013-08-25 DIAGNOSIS — I6529 Occlusion and stenosis of unspecified carotid artery: Secondary | ICD-10-CM | POA: Insufficient documentation

## 2013-08-25 NOTE — Progress Notes (Signed)
Carotid Duplex performed. 

## 2013-08-29 ENCOUNTER — Ambulatory Visit (INDEPENDENT_AMBULATORY_CARE_PROVIDER_SITE_OTHER): Payer: Medicare Other | Admitting: General Practice

## 2013-08-29 DIAGNOSIS — I420 Dilated cardiomyopathy: Secondary | ICD-10-CM

## 2013-08-29 DIAGNOSIS — I428 Other cardiomyopathies: Secondary | ICD-10-CM

## 2013-08-29 DIAGNOSIS — I447 Left bundle-branch block, unspecified: Secondary | ICD-10-CM

## 2013-08-29 DIAGNOSIS — Z5181 Encounter for therapeutic drug level monitoring: Secondary | ICD-10-CM

## 2013-08-29 LAB — POCT INR: INR: 2.5

## 2013-08-29 NOTE — Progress Notes (Signed)
Pre visit review using our clinic review tool, if applicable. No additional management support is needed unless otherwise documented below in the visit note. 

## 2013-08-31 DIAGNOSIS — M259 Joint disorder, unspecified: Secondary | ICD-10-CM | POA: Diagnosis not present

## 2013-08-31 DIAGNOSIS — I998 Other disorder of circulatory system: Secondary | ICD-10-CM | POA: Diagnosis not present

## 2013-08-31 DIAGNOSIS — S43429A Sprain of unspecified rotator cuff capsule, initial encounter: Secondary | ICD-10-CM | POA: Diagnosis not present

## 2013-08-31 DIAGNOSIS — M25519 Pain in unspecified shoulder: Secondary | ICD-10-CM | POA: Diagnosis not present

## 2013-09-02 DIAGNOSIS — M25569 Pain in unspecified knee: Secondary | ICD-10-CM | POA: Diagnosis not present

## 2013-09-06 ENCOUNTER — Telehealth: Payer: Self-pay | Admitting: Internal Medicine

## 2013-09-06 DIAGNOSIS — M25519 Pain in unspecified shoulder: Secondary | ICD-10-CM | POA: Diagnosis not present

## 2013-09-06 NOTE — Telephone Encounter (Signed)
FOLLOW UP    Pt returning this office call for his test results.   Please give him a call.

## 2013-09-07 NOTE — Telephone Encounter (Signed)
A user error has taken place: encounter opened in error, closed for administrative reasons.   See carotid documentation

## 2013-09-08 DIAGNOSIS — M25519 Pain in unspecified shoulder: Secondary | ICD-10-CM | POA: Diagnosis not present

## 2013-09-13 DIAGNOSIS — S46819A Strain of other muscles, fascia and tendons at shoulder and upper arm level, unspecified arm, initial encounter: Secondary | ICD-10-CM | POA: Diagnosis not present

## 2013-09-13 DIAGNOSIS — S43499A Other sprain of unspecified shoulder joint, initial encounter: Secondary | ICD-10-CM | POA: Diagnosis not present

## 2013-09-15 DIAGNOSIS — S43499A Other sprain of unspecified shoulder joint, initial encounter: Secondary | ICD-10-CM | POA: Diagnosis not present

## 2013-09-15 DIAGNOSIS — S46819A Strain of other muscles, fascia and tendons at shoulder and upper arm level, unspecified arm, initial encounter: Secondary | ICD-10-CM | POA: Diagnosis not present

## 2013-09-20 ENCOUNTER — Encounter: Payer: Self-pay | Admitting: Internal Medicine

## 2013-09-26 ENCOUNTER — Ambulatory Visit (INDEPENDENT_AMBULATORY_CARE_PROVIDER_SITE_OTHER): Payer: Medicare Other | Admitting: General Practice

## 2013-09-26 DIAGNOSIS — S43429A Sprain of unspecified rotator cuff capsule, initial encounter: Secondary | ICD-10-CM | POA: Diagnosis not present

## 2013-09-26 DIAGNOSIS — I2789 Other specified pulmonary heart diseases: Secondary | ICD-10-CM | POA: Diagnosis not present

## 2013-09-26 DIAGNOSIS — I480 Paroxysmal atrial fibrillation: Secondary | ICD-10-CM

## 2013-09-26 DIAGNOSIS — I272 Pulmonary hypertension, unspecified: Secondary | ICD-10-CM

## 2013-09-26 DIAGNOSIS — Z5181 Encounter for therapeutic drug level monitoring: Secondary | ICD-10-CM

## 2013-09-26 DIAGNOSIS — I4891 Unspecified atrial fibrillation: Secondary | ICD-10-CM | POA: Diagnosis not present

## 2013-09-26 LAB — POCT INR: INR: 3.5

## 2013-09-26 NOTE — Progress Notes (Signed)
Pre visit review using our clinic review tool, if applicable. No additional management support is needed unless otherwise documented below in the visit note. 

## 2013-10-20 DIAGNOSIS — M25569 Pain in unspecified knee: Secondary | ICD-10-CM | POA: Diagnosis not present

## 2013-10-24 ENCOUNTER — Ambulatory Visit (INDEPENDENT_AMBULATORY_CARE_PROVIDER_SITE_OTHER): Payer: Medicare Other | Admitting: General Practice

## 2013-10-24 DIAGNOSIS — I272 Pulmonary hypertension, unspecified: Secondary | ICD-10-CM

## 2013-10-24 DIAGNOSIS — Z5181 Encounter for therapeutic drug level monitoring: Secondary | ICD-10-CM | POA: Diagnosis not present

## 2013-10-24 DIAGNOSIS — I2789 Other specified pulmonary heart diseases: Secondary | ICD-10-CM

## 2013-10-24 DIAGNOSIS — I4891 Unspecified atrial fibrillation: Secondary | ICD-10-CM | POA: Diagnosis not present

## 2013-10-24 DIAGNOSIS — I480 Paroxysmal atrial fibrillation: Secondary | ICD-10-CM

## 2013-10-24 LAB — POCT INR: INR: 2.8

## 2013-10-24 NOTE — Progress Notes (Signed)
Pre visit review using our clinic review tool, if applicable. No additional management support is needed unless otherwise documented below in the visit note. 

## 2013-10-31 ENCOUNTER — Other Ambulatory Visit: Payer: Self-pay | Admitting: Internal Medicine

## 2013-11-20 ENCOUNTER — Other Ambulatory Visit: Payer: Self-pay | Admitting: Internal Medicine

## 2013-11-21 ENCOUNTER — Ambulatory Visit (INDEPENDENT_AMBULATORY_CARE_PROVIDER_SITE_OTHER): Payer: Medicare Other | Admitting: General Practice

## 2013-11-21 DIAGNOSIS — Z5181 Encounter for therapeutic drug level monitoring: Secondary | ICD-10-CM

## 2013-11-21 DIAGNOSIS — I2789 Other specified pulmonary heart diseases: Secondary | ICD-10-CM | POA: Diagnosis not present

## 2013-11-21 DIAGNOSIS — I4891 Unspecified atrial fibrillation: Secondary | ICD-10-CM

## 2013-11-21 DIAGNOSIS — I480 Paroxysmal atrial fibrillation: Secondary | ICD-10-CM

## 2013-11-21 DIAGNOSIS — I272 Pulmonary hypertension, unspecified: Secondary | ICD-10-CM

## 2013-11-21 LAB — POCT INR: INR: 3.5

## 2013-11-21 NOTE — Progress Notes (Signed)
Pre visit review using our clinic review tool, if applicable. No additional management support is needed unless otherwise documented below in the visit note. 

## 2013-11-24 ENCOUNTER — Telehealth: Payer: Self-pay | Admitting: Internal Medicine

## 2013-11-24 NOTE — Telephone Encounter (Signed)
Instructed pt that [name] would like him to be seen for clearance. Pt already scheduled for 9/1 OV with Caryl Comes, and I explained that we can address clearance then. Pt states he is unsure if the doctor can wait that long for an answer so he will call them to see if ok to wait till 9/1. We will touch base today or next week.

## 2013-11-24 NOTE — Telephone Encounter (Signed)
New message          Pt is having knee surgery on 9/14 and the office is faxing a clearance form to dr Caryl Comes / pt would like to know when he receives it

## 2013-12-12 ENCOUNTER — Ambulatory Visit: Payer: Medicare Other

## 2013-12-13 DIAGNOSIS — M6740 Ganglion, unspecified site: Secondary | ICD-10-CM | POA: Diagnosis not present

## 2013-12-15 ENCOUNTER — Ambulatory Visit (INDEPENDENT_AMBULATORY_CARE_PROVIDER_SITE_OTHER): Payer: Medicare Other | Admitting: General Practice

## 2013-12-15 ENCOUNTER — Other Ambulatory Visit: Payer: Self-pay | Admitting: Internal Medicine

## 2013-12-15 DIAGNOSIS — I272 Pulmonary hypertension, unspecified: Secondary | ICD-10-CM

## 2013-12-15 DIAGNOSIS — Z5181 Encounter for therapeutic drug level monitoring: Secondary | ICD-10-CM | POA: Diagnosis not present

## 2013-12-15 DIAGNOSIS — I2789 Other specified pulmonary heart diseases: Secondary | ICD-10-CM

## 2013-12-15 DIAGNOSIS — I4891 Unspecified atrial fibrillation: Secondary | ICD-10-CM | POA: Diagnosis not present

## 2013-12-15 DIAGNOSIS — I480 Paroxysmal atrial fibrillation: Secondary | ICD-10-CM

## 2013-12-15 LAB — POCT INR: INR: 5.3

## 2013-12-15 NOTE — Progress Notes (Signed)
Pre visit review using our clinic review tool, if applicable. No additional management support is needed unless otherwise documented below in the visit note. 

## 2013-12-17 ENCOUNTER — Other Ambulatory Visit: Payer: Self-pay | Admitting: Internal Medicine

## 2013-12-21 ENCOUNTER — Ambulatory Visit: Payer: Medicare Other | Admitting: Internal Medicine

## 2013-12-26 ENCOUNTER — Ambulatory Visit (INDEPENDENT_AMBULATORY_CARE_PROVIDER_SITE_OTHER): Payer: Medicare Other | Admitting: Family

## 2013-12-26 DIAGNOSIS — I4891 Unspecified atrial fibrillation: Secondary | ICD-10-CM

## 2013-12-26 DIAGNOSIS — I2789 Other specified pulmonary heart diseases: Secondary | ICD-10-CM

## 2013-12-26 DIAGNOSIS — Z5181 Encounter for therapeutic drug level monitoring: Secondary | ICD-10-CM

## 2013-12-26 DIAGNOSIS — I480 Paroxysmal atrial fibrillation: Secondary | ICD-10-CM

## 2013-12-26 DIAGNOSIS — I272 Pulmonary hypertension, unspecified: Secondary | ICD-10-CM

## 2013-12-26 LAB — POCT INR: INR: 2.6

## 2013-12-26 NOTE — Patient Instructions (Signed)
1 tablet all days except 1/2 tablet on Monday/Friday. Recheck in 4 weeks.  Anticoagulation Dose Instructions as of 12/26/2013     Dorene Grebe Tue Wed Thu Fri Sat   New Dose 5 mg 2.5 mg 5 mg 5 mg 5 mg 2.5 mg 5 mg    Description       1 tablet all days except 1/2 tablet on Monday/Friday. Recheck in 4 weeks.

## 2013-12-27 DIAGNOSIS — M6740 Ganglion, unspecified site: Secondary | ICD-10-CM | POA: Diagnosis not present

## 2014-01-08 ENCOUNTER — Other Ambulatory Visit: Payer: Self-pay | Admitting: Internal Medicine

## 2014-01-12 ENCOUNTER — Ambulatory Visit: Payer: Medicare Other | Admitting: Internal Medicine

## 2014-01-14 ENCOUNTER — Other Ambulatory Visit: Payer: Self-pay | Admitting: Internal Medicine

## 2014-01-21 ENCOUNTER — Other Ambulatory Visit: Payer: Self-pay | Admitting: Internal Medicine

## 2014-01-22 ENCOUNTER — Other Ambulatory Visit: Payer: Self-pay | Admitting: Internal Medicine

## 2014-01-23 ENCOUNTER — Ambulatory Visit (INDEPENDENT_AMBULATORY_CARE_PROVIDER_SITE_OTHER): Payer: Medicare Other | Admitting: Family

## 2014-01-23 DIAGNOSIS — Z5181 Encounter for therapeutic drug level monitoring: Secondary | ICD-10-CM | POA: Diagnosis not present

## 2014-01-23 LAB — POCT INR: INR: 2.2

## 2014-01-24 ENCOUNTER — Encounter (HOSPITAL_COMMUNITY): Payer: Self-pay | Admitting: Pharmacy Technician

## 2014-01-24 ENCOUNTER — Encounter: Payer: Self-pay | Admitting: Internal Medicine

## 2014-01-24 ENCOUNTER — Ambulatory Visit (INDEPENDENT_AMBULATORY_CARE_PROVIDER_SITE_OTHER): Payer: Medicare Other | Admitting: Internal Medicine

## 2014-01-24 ENCOUNTER — Telehealth: Payer: Self-pay | Admitting: Internal Medicine

## 2014-01-24 VITALS — BP 158/82 | HR 51 | Ht 68.0 in | Wt 186.0 lb

## 2014-01-24 DIAGNOSIS — I4891 Unspecified atrial fibrillation: Secondary | ICD-10-CM

## 2014-01-24 DIAGNOSIS — I6529 Occlusion and stenosis of unspecified carotid artery: Secondary | ICD-10-CM

## 2014-01-24 DIAGNOSIS — I480 Paroxysmal atrial fibrillation: Secondary | ICD-10-CM

## 2014-01-24 NOTE — Progress Notes (Signed)
Patient Care Team: Ricard Dillon, MD as PCP - General   HPI  Travis [name] is a 73 y.o. male Seen in followup for paroxysmal atrial fibrillation and tachycardia-induced and resolved cardiomyopathy seen in followup today.   He has one interval episode of atrial fib which was spontaneously terminated   He ended up with congestive heart failure 7/14 with an ejection fraction of 35%. Propafenone was discontinued and dofetilide was initiated. Echocardiogram 10/14 demonstrated normalization of LV function.  The patient denies chest pain, shortness of breath, nocturnal dyspnea, orthopnea or peripheral edema.  There have been no palpitations, lightheadedness or syncope.   He also has a probable bicuspid aortic valve with mild stenosis        Past Medical History  Diagnosis Date  . Arthritis   . Hypertension   . GERD (gastroesophageal reflux disease)   . Bicuspid aortic valve   . 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 Sinus bradycardia   . Paroxysmal atrial fibrillation     On Rythmol  . Cardiomyopathy -resolved     tachycardia-induced, EF 55% June 2009  . Left bundle branch block   . History of cardioversion 12/16/2012    Past Surgical History  Procedure Laterality Date  . Cerebral anuersym post clips    . Rotator cuff repair      lf  . Fractured left arm    . Tonsillectomy    . Left tendon repair      lft foot  . Hernia repair      lft  . Wrist ganglion excision      lft  . Tonsillectomy    . Inguinal hernia repair  07/02/2011    Procedure: LAPAROSCOPIC INGUINAL HERNIA;  Surgeon: Harl Bowie, MD;  Location: [place];  Service: General;  Laterality: Left;  Laparoscopic left inguinal hernia repair and mesh  . Cardiac catheterization    . Cardioversion N/A 12/16/2012    Procedure: CARDIOVERSION;  Surgeon: Lelon Perla, MD;  Location: [facility] ENDOSCOPY;  Service: Cardiovascular;  Laterality: N/A;    Current Outpatient Prescriptions  Medication Sig Dispense  Refill  . acetaminophen (TYLENOL) 500 MG tablet Take 1,000 mg by mouth every 6 (six) hours as needed for mild pain or moderate pain.      Marland Kitchen amLODipine (NORVASC) 5 MG tablet Take 5 mg by mouth every morning.      Marland Kitchen b complex vitamins tablet Take 1 tablet by mouth daily.       . carvedilol (COREG) 3.125 MG tablet Take 3.125 mg by mouth 2 (two) times daily with a meal.      . dofetilide (TIKOSYN) 500 MCG capsule Take 500 mcg by mouth 2 (two) times daily.      . finasteride (PROPECIA) 1 MG tablet Take 1 mg by mouth daily.      . fish oil-omega-3 fatty acids 1000 MG capsule Take 1 g by mouth daily.       . Flaxseed, Linseed, (RA FLAX SEED OIL 1000 PO) Take 1,000 mg by mouth 3 (three) times daily.       . Glucosamine Sulfate (GLUCOSAMINE RELIEF) 1000 MG TABS Take 1 tablet by mouth 2 (two) times daily.       . meloxicam (MOBIC) 15 MG tablet Take 15 mg by mouth daily.      Marland Kitchen omeprazole (PRILOSEC) 20 MG capsule Take 20 mg by mouth daily.      Marland Kitchen telmisartan (MICARDIS) 80 MG tablet Take 80 mg  by mouth at bedtime.      . temazepam (RESTORIL) 15 MG capsule Take 15 mg by mouth at bedtime.      Marland Kitchen warfarin (COUMADIN) 5 MG tablet Take 2.5-5 mg by mouth daily. Take 5mg  daily, except take 2.5mg  Monday and friday       No current facility-administered medications for this visit.    No Known Allergies  Review of Systems negative except from HPI and PMH  Physical Exam BP 158/82  Pulse 51  Ht 5\' 8"  (1.727 m)  Wt 186 lb (84.369 kg)  BMI 28.29 kg/m2 Well developed and well nourished in no acute distress HENT normal E scleral and icterus clear Neck Supple JVP flat; carotids brisk and full Clear to ausculation  slow Regular rate and rhythm, no murmurs gallops or rub Soft with active bowel sounds No clubbing cyanosis none Edema Alert and oriented, grossly normal motor and sensory function Skin Warm and Dry  ECG demonstrates a normal QT intervals heart rate of 43  Assessment and  Plan  Atrial  fibrillation-paroxysmal holding sinus rhythm on dofetilide. We'll check his surveillance laboratories today.continue warfarin  Cardiomyopathy-tachycardia mediated-resolved Continue beta blockers and ARB  Bicuspid aortic valve-repeat echo next year  Hypertension  Preoperative assessment  Functional status is quite stable. His risks for his knee surgery her quite comfortable. Anticoagulation will be discontinued in anticipation of that surgery. He and I have discussed the role of NOACs as an alternative to warfarin for long-term anticoagulation and Dr. Alvan Dame can certainly initiate a NOAC following his knee replacement surgery.  His blood pressures at home have been in the 130 range; we will not change his medications at this point.  We will get a repeat echo   to followup bicuspid valve at his next visit valve

## 2014-01-24 NOTE — Telephone Encounter (Signed)
Faxed clearance to [place] for Bilateral: TKA - medial & lateral w/wo patella resurfacing.

## 2014-01-24 NOTE — Patient Instructions (Signed)
Your physician recommends that you continue on your current medications as directed. Please refer to the Current Medication list given to you today.  Please get Magnesium and Potassium levels drawn at your pre-procedure lab work on 9/4 -- handwritten prescription given to you for this.  Your physician has requested that you have an echocardiogram in 6 months. Echocardiography is a painless test that uses sound waves to create images of your heart. It provides your doctor with information about the size and shape of your heart and how well your heart's chambers and valves are working. This procedure takes approximately one hour. There are no restrictions for this procedure.  Your physician wants you to follow-up in: 6 months with [name]. You will receive a reminder letter in the mail two months in advance. If you don't receive a letter, please call our office to schedule the follow-up appointment.

## 2014-01-24 NOTE — Telephone Encounter (Signed)
New message     Pt was seen an hour ago.  Ins will pay for prodaxa---but need prior approval called in to [phone].  Let him know if you have any problems

## 2014-01-25 ENCOUNTER — Other Ambulatory Visit: Payer: Self-pay | Admitting: *Deleted

## 2014-01-25 ENCOUNTER — Encounter (HOSPITAL_COMMUNITY): Payer: Self-pay

## 2014-01-25 NOTE — Patient Instructions (Addendum)
Travis [name]  01/25/2014                           YOUR PROCEDURE IS SCHEDULED ON:  02/06/14               ENTER THRU [facility] ENTR[facility] AND                            FOLLOW  SIGNS TO SHORT STAY CENTER                 ARRIVE AT SHORT STAY AT: 6:30 am               CALL THIS NUMBER IF ANY PROBLEMS THE DAY OF SURGERY :               [phone]                                REMEMBER:   Do not eat food or drink liquids AFTER MIDNIGHT                  Take these medicines the morning of surgery with               A SIPS OF WATER :    AMLODIPINE / CARVEDILOL / TIKOSYN / FINASTERIDE / OMEPRAZOLE     Do not wear jewelry, make-up   Do not wear lotions, powders, or perfumes.   Do not shave legs or underarms 12 hrs. before surgery (men may shave face)  Do not bring valuables to the hospital.  Contacts, dentures or bridgework may not be worn into surgery.  Leave suitcase in the car. After surgery it may be brought to your room.  For patients admitted to the hospital more than one night, checkout time is            11:00 AM                                           ________________________________________________________________________                                                   [place]  Before surgery, you can play an important role.  Because skin is not sterile, your skin needs to be as free of germs as possible.  You can reduce the number of germs on your skin by washing with CHG (chlorahexidine gluconate) soap before surgery.  CHG is an antiseptic cleaner which kills germs and bonds with the skin to continue killing germs even after washing. Please DO NOT use if you have an allergy to CHG or antibacterial soaps.  If your skin becomes reddened/irritated stop using the CHG and inform your nurse when you arrive at Short Stay. Do not shave (including legs and underarms) for at least 48 hours prior to the first CHG shower.  You  may shave your face. Please follow these instructions carefully:   1.  Shower with CHG Soap the night before surgery and the  morning of Surgery.  2.  If you choose to wash your hair, wash your hair first as usual with your  normal  Shampoo.   3.  After you shampoo, rinse your hair and body thoroughly to remove the  shampoo.                                         4.  Use CHG as you would any other liquid soap.  You can apply chg directly  to the skin and wash . Gently wash with scrungie or clean wascloth    5.  Apply the CHG Soap to your body ONLY FROM THE NECK DOWN.   Do not use on open                           Wound or open sores. Avoid contact with eyes, ears mouth and genitals (private parts).                        Genitals (private parts) with your normal soap.              6.  Wash thoroughly, paying special attention to the area where your surgery  will be performed.   7.  Thoroughly rinse your body with warm water from the neck down.   8.  DO NOT shower/wash with your normal soap after using and rinsing off  the CHG Soap .                9.  Pat yourself dry with a clean towel.             10.  Wear clean pajamas.             11.  Place clean sheets on your bed the night of your first shower and do not  sleep with pets.  Day of Surgery : Do not apply any lotions/deodorants the morning of surgery.  Please wear clean clothes to the hospital/surgery center.  FAILURE TO FOLLOW THESE INSTRUCTIONS MAY RESULT IN THE CANCELLATION OF YOUR SURGERY    PATIENT SIGNATURE_________________________________  ______________________________________________________________________     Travis [name]  An incentive spirometer is a tool that can help keep your lungs clear and active. This tool measures how well you are filling your lungs with each breath. Taking long deep breaths may help reverse or decrease the chance of developing breathing (pulmonary) problems (especially  infection) following:  A long period of time when you are unable to move or be active. BEFORE THE PROCEDURE   If the spirometer includes an indicator to show your best effort, your nurse or respiratory therapist will set it to a desired goal.  If possible, sit up straight or lean slightly forward. Try not to slouch.  Hold the incentive spirometer in an upright position. INSTRUCTIONS FOR USE  1. Sit on the edge of your bed if possible, or sit up as far as you can in bed or on a chair. 2. Hold the incentive spirometer in an upright position. 3. Breathe out normally. 4. Place the mouthpiece in your mouth and seal your lips tightly around it. 5. Breathe in slowly and as deeply as possible, raising the piston or the ball toward the top of the column. 6. Hold your breath for 3-5 seconds or for as long as possible. Allow the  piston or ball to fall to the bottom of the column. 7. Remove the mouthpiece from your mouth and breathe out normally. 8. Rest for a few seconds and repeat Steps 1 through 7 at least 10 times every 1-2 hours when you are awake. Take your time and take a few normal breaths between deep breaths. 9. The spirometer may include an indicator to show your best effort. Use the indicator as a goal to work toward during each repetition. 10. After each set of 10 deep breaths, practice coughing to be sure your lungs are clear. If you have an incision (the cut made at the time of surgery), support your incision when coughing by placing a pillow or rolled up towels firmly against it. Once you are able to get out of bed, walk around indoors and cough well. You may stop using the incentive spirometer when instructed by your caregiver.  RISKS AND COMPLICATIONS  Take your time so you do not get dizzy or light-headed.  If you are in pain, you may need to take or ask for pain medication before doing incentive spirometry. It is harder to take a deep breath if you are having pain. AFTER  USE  Rest and breathe slowly and easily.  It can be helpful to keep track of a log of your progress. Your caregiver can provide you with a simple table to help with this. If you are using the spirometer at home, follow these instructions: [place] IF:   You are having difficultly using the spirometer.  You have trouble using the spirometer as often as instructed.  Your pain medication is not giving enough relief while using the spirometer.  You develop fever of 100.5 F (38.1 C) or higher. SEEK IMMEDIATE MEDICAL CARE IF:   You cough up bloody sputum that had not been present before.  You develop fever of 102 F (38.9 C) or greater.  You develop worsening pain at or near the incision site. MAKE SURE YOU:   Understand these instructions.  Will watch your condition.  Will get help right away if you are not doing well or get worse. Document Released: 09/22/2006 Document Revised: 08/04/2011 Document Reviewed: 11/23/2006 [employer] Patient Information 2014 [employer], [place].   ________________________________________________________________________  WHAT IS A BLOOD TRANSFUSION? Blood Transfusion Information  A transfusion is the replacement of blood or some of its parts. Blood is made up of multiple cells which provide different functions.  Red blood cells carry oxygen and are used for blood loss replacement.  White blood cells fight against infection.  Platelets control bleeding.  Plasma helps clot blood.  Other blood products are available for specialized needs, such as hemophilia or other clotting disorders. BEFORE THE TRANSFUSION  Who gives blood for transfusions?   Healthy volunteers who are fully evaluated to make sure their blood is safe. This is blood bank blood. Transfusion therapy is the safest it has ever been in the practice of medicine. Before blood is taken from a donor, a complete history is taken to make sure that person has no history of diseases  nor engages in risky social behavior (examples are intravenous drug use or sexual activity with multiple partners). The donor's travel history is screened to minimize risk of transmitting infections, such as malaria. The donated blood is tested for signs of infectious diseases, such as HIV and hepatitis. The blood is then tested to be sure it is compatible with you in order to minimize the chance of a transfusion reaction. If  you or a relative donates blood, this is often done in anticipation of surgery and is not appropriate for emergency situations. It takes many days to process the donated blood. RISKS AND COMPLICATIONS Although transfusion therapy is very safe and saves many lives, the main dangers of transfusion include:   Getting an infectious disease.  Developing a transfusion reaction. This is an allergic reaction to something in the blood you were given. Every precaution is taken to prevent this. The decision to have a blood transfusion has been considered carefully by your caregiver before blood is given. Blood is not given unless the benefits outweigh the risks. AFTER THE TRANSFUSION  Right after receiving a blood transfusion, you will usually feel much better and more energetic. This is especially true if your red blood cells have gotten low (anemic). The transfusion raises the level of the red blood cells which carry oxygen, and this usually causes an energy increase.  The nurse administering the transfusion will monitor you carefully for complications. HOME CARE INSTRUCTIONS  No special instructions are needed after a transfusion. You may find your energy is better. Speak with your caregiver about any limitations on activity for underlying diseases you may have. SEEK MEDICAL CARE IF:   Your condition is not improving after your transfusion.  You develop redness or irritation at the intravenous (IV) site. SEEK IMMEDIATE MEDICAL CARE IF:  Any of the following symptoms occur over the  next 12 hours:  Shaking chills.  You have a temperature by mouth above 102 F (38.9 C), not controlled by medicine.  Chest, back, or muscle pain.  People around you feel you are not acting correctly or are confused.  Shortness of breath or difficulty breathing.  Dizziness and fainting.  You get a rash or develop hives.  You have a decrease in urine output.  Your urine turns a dark color or changes to pink, red, or brown. Any of the following symptoms occur over the next 10 days:  You have a temperature by mouth above 102 F (38.9 C), not controlled by medicine.  Shortness of breath.  Weakness after normal activity.  The white part of the eye turns yellow (jaundice).  You have a decrease in the amount of urine or are urinating less often.  Your urine turns a dark color or changes to pink, red, or brown. Document Released: 05/09/2000 Document Revised: 08/04/2011 Document Reviewed: 12/27/2007 [facility] Patient Information 2014 [place], [place].  _______________________________________________________________________

## 2014-01-27 ENCOUNTER — Ambulatory Visit (HOSPITAL_COMMUNITY)
Admission: RE | Admit: 2014-01-27 | Discharge: 2014-01-27 | Disposition: A | Discharge status: Home / Self Care | Payer: Medicare Other | Source: Ambulatory Visit | Attending: Anesthesiology | Admitting: Anesthesiology

## 2014-01-27 ENCOUNTER — Encounter (HOSPITAL_COMMUNITY)
Admission: RE | Admit: 2014-01-27 | Discharge: 2014-01-27 | Disposition: A | Discharge status: Home / Self Care | Payer: Medicare Other | Source: Ambulatory Visit | Attending: Orthopedic Surgery | Admitting: Orthopedic Surgery

## 2014-01-27 ENCOUNTER — Other Ambulatory Visit: Payer: Self-pay | Admitting: *Deleted

## 2014-01-27 ENCOUNTER — Encounter (HOSPITAL_COMMUNITY): Payer: Self-pay

## 2014-01-27 DIAGNOSIS — Z01818 Encounter for other preprocedural examination: Secondary | ICD-10-CM | POA: Diagnosis not present

## 2014-01-27 HISTORY — DX: Personal history of peptic ulcer disease: Z87.11

## 2014-01-27 HISTORY — DX: Heart failure, unspecified: I50.9

## 2014-01-27 HISTORY — DX: Personal history of other diseases of the digestive system: Z87.19

## 2014-01-27 LAB — BASIC METABOLIC PANEL
Anion gap: 12 (ref 5–15)
BUN: 17 mg/dL (ref 6–23)
CHLORIDE: 95 meq/L — AB (ref 96–112)
CO2: 27 meq/L (ref 19–32)
Calcium: 9.6 mg/dL (ref 8.4–10.5)
Creatinine, Ser: 0.71 mg/dL (ref 0.50–1.35)
GFR calc non Af Amer: >90 mL/min (ref >90)
Glucose, Bld: 118 mg/dL — ABNORMAL HIGH (ref 70–99)
Potassium: 5.3 mEq/L (ref 3.7–5.3)
Sodium: 134 mEq/L — ABNORMAL LOW (ref 137–147)

## 2014-01-27 LAB — URINALYSIS, ROUTINE W REFLEX MICROSCOPIC
Bilirubin Urine: NEGATIVE
Glucose, UA: NEGATIVE mg/dL
Hgb urine dipstick: NEGATIVE
Ketones, ur: NEGATIVE mg/dL
Leukocytes, UA: NEGATIVE
NITRITE: NEGATIVE
Protein, ur: NEGATIVE mg/dL
SPECIFIC GRAVITY, URINE: 1.024 (ref 1.005–1.030)
UROBILINOGEN UA: 1 mg/dL (ref 0.0–1.0)
pH: 7 (ref 5.0–8.0)

## 2014-01-27 LAB — CBC
HEMATOCRIT: 43.9 % (ref 39.0–52.0)
HEMOGLOBIN: 15.6 g/dL (ref 13.0–17.0)
MCH: 34.7 pg — AB (ref 26.0–34.0)
MCHC: 35.5 g/dL (ref 30.0–36.0)
MCV: 97.6 fL (ref 78.0–100.0)
Platelets: 188 10*3/uL (ref 150–400)
RBC: 4.5 MIL/uL (ref 4.22–5.81)
RDW: 13.5 % (ref 11.5–15.5)
WBC: 7.1 10*3/uL (ref 4.0–10.5)

## 2014-01-27 LAB — PROTIME-INR
INR: 2.57 — ABNORMAL HIGH (ref 0.00–1.49)
Prothrombin Time: 27.6 seconds — ABNORMAL HIGH (ref 11.6–15.2)

## 2014-01-27 LAB — MAGNESIUM: MAGNESIUM: 2 mg/dL (ref 1.5–2.5)

## 2014-01-27 LAB — APTT: aPTT: 44 seconds — ABNORMAL HIGH (ref 24–37)

## 2014-01-27 LAB — SURGICAL PCR SCREEN
MRSA, PCR: NEGATIVE
Staphylococcus aureus: NEGATIVE

## 2014-01-27 MED ORDER — DABIGATRAN ETEXILATE MESYLATE 150 MG PO CAPS: 150.0000 mg | ORAL_CAPSULE | Freq: Two times a day (BID) | ORAL | Status: DC

## 2014-01-27 NOTE — Telephone Encounter (Addendum)
Informed patient I called and gave information for prior approval. They will let us know within 72 hours via fax. Left samples at front desk for patient to pick up on Tuesday.   He will stop Coumadin 5 days before surgery on 9/14, then will start Pradaxa after surgery. He will call when he need refills sent in for this medication.

## 2014-01-27 NOTE — Progress Notes (Signed)
Magnesium and potassium results faxed to [name] as requested

## 2014-01-31 ENCOUNTER — Telehealth: Payer: Self-pay | Admitting: Internal Medicine

## 2014-01-31 NOTE — Telephone Encounter (Signed)
Walk in pt form " Sealed Envelope" gave to Sherri P 9.8.15/km

## 2014-02-03 ENCOUNTER — Other Ambulatory Visit: Payer: Self-pay | Admitting: *Deleted

## 2014-02-03 MED ORDER — DABIGATRAN ETEXILATE MESYLATE 150 MG PO CAPS: 150.0000 mg | ORAL_CAPSULE | Freq: Two times a day (BID) | ORAL | Status: DC

## 2014-02-04 NOTE — H&P (Signed)
TOTAL KNEE ADMISSION H&P  Patient is being admitted for bilateral total knee arthroplasty.  Subjective:  Chief Complaint:     Bilateral knee OA / pain.  HPI: Travis [name], 73 y.o. male, has a history of pain and functional disability in the bilateral knee due to arthritis and has failed non-surgical conservative treatments for greater than 12 weeks to include NSAID's and/or analgesics, corticosteriod injections, use of assistive devices and activity modification.  Onset of symptoms was gradual, starting 3+ years ago with gradually worsening course since that time. The patient noted no past surgery on the bilateral knees.  Patient currently rates pain in the bilateral knee(s) at 7 out of 10 with activity. Patient has worsening of pain with activity and weight bearing, pain that interferes with activities of daily living, pain with passive range of motion, crepitus and joint swelling.  Patient has evidence of periarticular osteophytes and joint space narrowing by imaging studies. There is no active infection.  Risks, benefits and expectations were discussed with the patient.  Risks including but not limited to the risk of anesthesia, blood clots, nerve damage, blood vessel damage, failure of the prosthesis, infection and up to and including death.  Patient understand the risks, benefits and expectations and wishes to proceed with surgery.   PCP: Georgetta Haber, MD  D/C Plans:      Home with HHPT/SNF  Post-op Meds:       No Rx given   Tranexamic Acid:      To be given - topically (CAD)  Decadron:      Is to be given  FYI:     Pradaxa post-op  (Doctor would like to change from coumadin using this transition time.  Oxycodone     Patient Active Problem List   Diagnosis Date Noted  . Encounter for therapeutic drug monitoring 08/08/2013  . Sinus bradycardia 01/04/2013  . Pulmonary hypertension 03/09/2012  . Hyponatremia 02/21/2012  . Bicuspid aortic valve   . Paroxysmal atrial  fibrillation   . Hypertension   . Cardiomyopathy, rate related with resolution now recurrent 12/08/2011  . Recurrent left inguinal hernia 05/12/2011  . Left bundle branch block 12/10/2010  . DRY MOUTH 11/07/2009  . ING HERN W/O MENTION OBST/GANGREN UNILAT/UNSPEC 08/08/2009  . CAROTID BRUIT 07/05/2009  . TRANSIENT DISORDER INITIATING/MAINTAINING SLEEP 09/11/2008  . GERD 01/07/2008  . OSTEOARTHRITIS 02/04/2007  . GANGLION CYST, WRIST, LEFT 02/04/2007   Past Medical History  Diagnosis Date  . Arthritis   . Hypertension   . GERD (gastroesophageal reflux disease)   . Bicuspid aortic valve   . 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 Sinus bradycardia   . Paroxysmal atrial fibrillation   . Cardiomyopathy -resolved     tachycardia-induced, EF 55% June 2009  . History of cardioversion 12/16/2012  . Left bundle branch block   . CHF (congestive heart failure)     JULY 2014  . History of stomach ulcers     Past Surgical History  Procedure Laterality Date  . Cerebral anuersym post clips    . Rotator cuff repair      lf  . Fractured left arm    . Left tendon repair      lft foot  . Hernia repair      lft  . Wrist ganglion excision      lft  . Tonsillectomy    . Inguinal hernia repair  07/02/2011    Procedure: LAPAROSCOPIC INGUINAL HERNIA;  Surgeon: Harl Bowie,  MD;  Location: [facility];  Service: General;  Laterality: Left;  Laparoscopic left inguinal hernia repair and mesh  . Cardiac catheterization    . Cardioversion N/A 12/16/2012    Procedure: CARDIOVERSION;  Surgeon: Lelon Perla, MD;  Location[facility];  Service: Cardiovascular;  Laterality: N/A;    No prescriptions prior to admission   No Known Allergies   History  Substance Use Topics  . Smoking status: Former Smoker -- 1.50 packs/day    Quit date: 05/26/1986  . Smokeless tobacco: Never Used  . Alcohol Use: Yes     Comment: 1 daily     Family History  Problem Relation Age of Onset  . Stroke  Father   . Lung cancer Mother 34  . Stroke Mother   . Colon cancer Neg Hx      Review of Systems  Constitutional: Negative.   HENT: Negative.   Eyes: Negative.   Respiratory: Negative.   Cardiovascular: Negative.   Gastrointestinal: Positive for heartburn.  Genitourinary: Negative.   Musculoskeletal: Positive for joint pain.  Skin: Negative.   Neurological: Negative.   Endo/Heme/Allergies: Negative.   Psychiatric/Behavioral: Negative.     Objective:  Physical Exam  Constitutional: He is oriented to person, place, and time. He appears well-developed and well-nourished.  HENT:  Head: Normocephalic and atraumatic.  Mouth/Throat: Oropharynx is clear and moist.  Eyes: Pupils are equal, round, and reactive to light.  Neck: Neck supple. No JVD present. No tracheal deviation present. No thyromegaly present.  Cardiovascular: Normal rate, regular rhythm and intact distal pulses.   Respiratory: Effort normal and breath sounds normal. No stridor. No respiratory distress. He has no wheezes.  GI: Soft. There is no tenderness. There is no guarding.  Musculoskeletal:       Right knee: He exhibits decreased range of motion, swelling and bony tenderness. He exhibits no ecchymosis, no deformity, no laceration and no erythema. Tenderness found.       Left knee: He exhibits decreased range of motion, swelling and bony tenderness. He exhibits no ecchymosis, no deformity, no laceration and no erythema. Tenderness found.  Lymphadenopathy:    He has no cervical adenopathy.  Neurological: He is alert and oriented to person, place, and time.  Skin: Skin is warm and dry.  Psychiatric: He has a normal mood and affect.     Labs:  Estimated body mass index is 27.53 kg/(m^2) as calculated from the following:   Height as of 07/14/13: 5\' 8"  (1.727 m).   Weight as of 07/14/13: 82.101 kg (181 lb).   Imaging Review Plain radiographs demonstrate severe degenerative joint disease of the bilaterally  knee(s). The overall alignment is neutral. The bone quality appears to be good for age and reported activity level.  Assessment/Plan:  End stage arthritis, bilaterally knee   The patient history, physical examination, clinical judgment of the provider and imaging studies are consistent with end stage degenerative joint disease of the bilaterally knee(s) and total knee arthroplasty is deemed medically necessary. The treatment options including medical management, injection therapy arthroscopy and arthroplasty were discussed at length. The risks and benefits of total knee arthroplasty were presented and reviewed. The risks due to aseptic loosening, infection, stiffness, patella tracking problems, thromboembolic complications and other imponderables were discussed. The patient acknowledged the explanation, agreed to proceed with the plan and consent was signed. Patient is being admitted for inpatient treatment for surgery, pain control, PT, OT, prophylactic antibiotics, VTE prophylaxis, progressive ambulation and ADL's and discharge planning. The patient  is planning to be discharged home with home health services.    West Pugh Erick Oxendine   PA-C  02/04/2014, 2:15 PM

## 2014-02-05 NOTE — Anesthesia Preprocedure Evaluation (Addendum)
Anesthesia Evaluation  Patient identified by MRN, date of birth, ID band Patient awake    Reviewed: Allergy & Precautions, H&P , NPO status , Patient's Chart, lab work & pertinent test results, reviewed documented beta blocker date and time   Airway Mallampati: II TM Distance: >3 FB Neck ROM: Full    Dental  (+) Teeth Intact, Dental Advisory Given   Pulmonary former smoker,  breath sounds clear to auscultation        Cardiovascular hypertension, Pt. on medications and Pt. on home beta blockers +CHF + dysrhythmias Atrial Fibrillation Rhythm:Regular Rate:Normal  Echo- Left ventricle: The cavity size was mildly dilated. Wall  thickness was normal. Systolic function was normal. The estimated ejection fraction was in the range of 50% to 55%. Wall motion was normal; there were no regional wall  motion abnormalities. Left ventricular diastolic function parameters were normal. - Aortic valve: Possibly bicuspid. There was mild stenosis.   Mild regurgitation. Mean gradient: 80mm Hg (S). Peak   gradient: 33mm Hg (S). - Mitral valve: Mild regurgitation. - Left atrium: The atrium was moderately dilated. - Right ventricle: The cavity size was mildly dilated. - Right atrium: The atrium was mildly dilated. - Atrial septum: No defect or patent foramen ovale was   identified. - Pulmonary arteries: PA peak pressure: 45mm Hg (S).    Neuro/Psych negative neurological ROS  negative psych ROS   GI/Hepatic Neg liver ROS, GERD-  Medicated and Controlled,  Endo/Other  negative endocrine ROS  Renal/GU negative Renal ROS     Musculoskeletal  (+) Arthritis -, Osteoarthritis,    Abdominal   Peds  Hematology   Anesthesia Other Findings   Reproductive/Obstetrics                       Anesthesia Physical  Anesthesia Plan  ASA: III  Anesthesia Plan:    Post-op Pain Management:    Induction: Intravenous  Airway  Management Planned:   Additional Equipment:   Intra-op Plan:   Post-operative Plan:   Informed Consent: I have reviewed the patients History and Physical, chart, labs and discussed the procedure including the risks, benefits and alternatives for the proposed anesthesia with the patient or authorized representative who has indicated his/her understanding and acceptance.   Dental advisory given  Plan Discussed with: CRNA  Anesthesia Plan Comments:       Anesthesia Quick Evaluation

## 2014-02-06 ENCOUNTER — Encounter (HOSPITAL_COMMUNITY): Payer: Self-pay | Admitting: *Deleted

## 2014-02-06 ENCOUNTER — Encounter (HOSPITAL_COMMUNITY): Payer: Medicare Other | Admitting: Anesthesiology

## 2014-02-06 ENCOUNTER — Encounter (HOSPITAL_COMMUNITY)
Admission: RE | Disposition: A | Discharge status: Rehabilitation Facility | Payer: Self-pay | Source: Ambulatory Visit | Attending: Orthopedic Surgery

## 2014-02-06 ENCOUNTER — Inpatient Hospital Stay (HOSPITAL_COMMUNITY): Payer: Medicare Other | Admitting: Anesthesiology

## 2014-02-06 ENCOUNTER — Inpatient Hospital Stay (HOSPITAL_COMMUNITY)
Admission: RE | Admit: 2014-02-06 | Discharge: 2014-02-08 | DRG: 462 | Disposition: A | Discharge status: Rehabilitation Facility | Payer: Medicare Other | Source: Ambulatory Visit | Attending: Orthopedic Surgery | Admitting: Orthopedic Surgery

## 2014-02-06 DIAGNOSIS — M6580 Other synovitis and tenosynovitis, unspecified site: Secondary | ICD-10-CM | POA: Diagnosis present

## 2014-02-06 DIAGNOSIS — K219 Gastro-esophageal reflux disease without esophagitis: Secondary | ICD-10-CM | POA: Diagnosis present

## 2014-02-06 DIAGNOSIS — M1710 Unilateral primary osteoarthritis, unspecified knee: Principal | ICD-10-CM | POA: Diagnosis present

## 2014-02-06 DIAGNOSIS — Z6827 Body mass index (BMI) 27.0-27.9, adult: Secondary | ICD-10-CM

## 2014-02-06 DIAGNOSIS — M7989 Other specified soft tissue disorders: Secondary | ICD-10-CM | POA: Diagnosis not present

## 2014-02-06 DIAGNOSIS — I509 Heart failure, unspecified: Secondary | ICD-10-CM | POA: Diagnosis not present

## 2014-02-06 DIAGNOSIS — D500 Iron deficiency anemia secondary to blood loss (chronic): Secondary | ICD-10-CM | POA: Diagnosis not present

## 2014-02-06 DIAGNOSIS — M25569 Pain in unspecified knee: Secondary | ICD-10-CM | POA: Diagnosis not present

## 2014-02-06 DIAGNOSIS — Z96659 Presence of unspecified artificial knee joint: Secondary | ICD-10-CM

## 2014-02-06 DIAGNOSIS — D62 Acute posthemorrhagic anemia: Secondary | ICD-10-CM | POA: Diagnosis not present

## 2014-02-06 DIAGNOSIS — E663 Overweight: Secondary | ICD-10-CM | POA: Diagnosis present

## 2014-02-06 DIAGNOSIS — Z96653 Presence of artificial knee joint, bilateral: Secondary | ICD-10-CM

## 2014-02-06 DIAGNOSIS — I10 Essential (primary) hypertension: Secondary | ICD-10-CM | POA: Diagnosis present

## 2014-02-06 DIAGNOSIS — I5022 Chronic systolic (congestive) heart failure: Secondary | ICD-10-CM | POA: Diagnosis not present

## 2014-02-06 DIAGNOSIS — Z87891 Personal history of nicotine dependence: Secondary | ICD-10-CM | POA: Diagnosis not present

## 2014-02-06 DIAGNOSIS — I4891 Unspecified atrial fibrillation: Secondary | ICD-10-CM | POA: Diagnosis present

## 2014-02-06 DIAGNOSIS — Z5189 Encounter for other specified aftercare: Secondary | ICD-10-CM | POA: Diagnosis not present

## 2014-02-06 HISTORY — PX: TOTAL KNEE ARTHROPLASTY: SHX125

## 2014-02-06 LAB — TYPE AND SCREEN
ABO/RH(D): O POS
Antibody Screen: NEGATIVE

## 2014-02-06 LAB — ABO/RH: ABO/RH(D): O POS

## 2014-02-06 LAB — PROTIME-INR
INR: 0.89 (ref 0.00–1.49)
Prothrombin Time: 12.1 seconds (ref 11.6–15.2)

## 2014-02-06 SURGERY — ARTHROPLASTY, KNEE, BILATERAL, TOTAL
Anesthesia: Spinal | Site: Knee | Laterality: Bilateral

## 2014-02-06 MED ORDER — TEMAZEPAM 15 MG PO CAPS
15.0000 mg | ORAL_CAPSULE | Freq: Every day | ORAL | Status: DC
  Administered 2014-02-06 – 2014-02-07 (×2): 15 mg via ORAL
  Filled 2014-02-06 (×2): qty 1

## 2014-02-06 MED ORDER — SODIUM CHLORIDE 0.9 % IV SOLN
INTRAVENOUS | Status: DC
  Administered 2014-02-06 – 2014-02-07 (×3): via INTRAVENOUS
  Filled 2014-02-06 (×12): qty 1000

## 2014-02-06 MED ORDER — PROPOFOL 10 MG/ML IV BOLUS
INTRAVENOUS | Status: AC
  Filled 2014-02-06: qty 20

## 2014-02-06 MED ORDER — ONDANSETRON HCL 4 MG PO TABS
4.0000 mg | ORAL_TABLET | Freq: Four times a day (QID) | ORAL | Status: DC | PRN
  Filled 2014-02-06: qty 1

## 2014-02-06 MED ORDER — MIDAZOLAM HCL 5 MG/5ML IJ SOLN
INTRAMUSCULAR | Status: DC | PRN
  Administered 2014-02-06 (×2): 1 mg via INTRAVENOUS

## 2014-02-06 MED ORDER — CEFAZOLIN SODIUM-DEXTROSE 2-3 GM-% IV SOLR
INTRAVENOUS | Status: AC
  Filled 2014-02-06: qty 50

## 2014-02-06 MED ORDER — EPHEDRINE SULFATE 50 MG/ML IJ SOLN
INTRAMUSCULAR | Status: DC | PRN
  Administered 2014-02-06: 7.5 mg via INTRAVENOUS

## 2014-02-06 MED ORDER — DIPHENHYDRAMINE HCL 50 MG/ML IJ SOLN: 25.0000 mg | INTRAMUSCULAR | Status: DC | PRN

## 2014-02-06 MED ORDER — IRBESARTAN 300 MG PO TABS
300.0000 mg | ORAL_TABLET | Freq: Every day | ORAL | Status: DC
  Filled 2014-02-06: qty 1

## 2014-02-06 MED ORDER — PROPOFOL 10 MG/ML IV BOLUS
INTRAVENOUS | Status: DC | PRN
  Administered 2014-02-06: 30 mg via INTRAVENOUS

## 2014-02-06 MED ORDER — LACTATED RINGERS IV SOLN
INTRAVENOUS | Status: DC | PRN
  Administered 2014-02-06 (×3): via INTRAVENOUS
  Administered 2014-02-06: 1000 mL

## 2014-02-06 MED ORDER — BUPIVACAINE HCL (PF) 0.5 % IJ SOLN
INTRAMUSCULAR | Status: AC
  Filled 2014-02-06: qty 30

## 2014-02-06 MED ORDER — OXYCODONE HCL 5 MG PO TABS
5.0000 mg | ORAL_TABLET | ORAL | Status: DC
  Administered 2014-02-06 (×2): 10 mg via ORAL
  Administered 2014-02-06 – 2014-02-08 (×10): 15 mg via ORAL
  Filled 2014-02-06 (×2): qty 3
  Filled 2014-02-06: qty 2
  Filled 2014-02-06 (×8): qty 3
  Filled 2014-02-06: qty 2
  Filled 2014-02-06: qty 3

## 2014-02-06 MED ORDER — CEFAZOLIN SODIUM-DEXTROSE 2-3 GM-% IV SOLR
2.0000 g | Freq: Four times a day (QID) | INTRAVENOUS | Status: AC
  Administered 2014-02-06 – 2014-02-07 (×2): 2 g via INTRAVENOUS
  Filled 2014-02-06 (×3): qty 50

## 2014-02-06 MED ORDER — MAGNESIUM CITRATE PO SOLN: 1.0000 | Freq: Once | ORAL | Status: AC | PRN

## 2014-02-06 MED ORDER — 0.9 % SODIUM CHLORIDE (POUR BTL) OPTIME
TOPICAL | Status: DC | PRN
  Administered 2014-02-06: 1000 mL

## 2014-02-06 MED ORDER — OXYCODONE HCL 5 MG/5ML PO SOLN
5.0000 mg | Freq: Once | ORAL | Status: DC | PRN
  Filled 2014-02-06: qty 5

## 2014-02-06 MED ORDER — CEFAZOLIN SODIUM-DEXTROSE 2-3 GM-% IV SOLR
2.0000 g | INTRAVENOUS | Status: AC
  Administered 2014-02-06: 2 g via INTRAVENOUS

## 2014-02-06 MED ORDER — MENTHOL 3 MG MT LOZG
1.0000 | LOZENGE | OROMUCOSAL | Status: DC | PRN
  Administered 2014-02-07: 3 mg via ORAL
  Filled 2014-02-06 (×2): qty 9

## 2014-02-06 MED ORDER — TRANEXAMIC ACID 100 MG/ML IV SOLN
2000.0000 mg | INTRAVENOUS | Status: DC | PRN
  Administered 2014-02-06: 2000 mg via TOPICAL

## 2014-02-06 MED ORDER — BUPIVACAINE LIPOSOME 1.3 % IJ SUSP
20.0000 mL | Freq: Once | INTRAMUSCULAR | Status: AC
  Administered 2014-02-06: 10 mL
  Filled 2014-02-06: qty 20

## 2014-02-06 MED ORDER — TRANEXAMIC ACID 100 MG/ML IV SOLN
2000.0000 mg | Freq: Once | INTRAVENOUS | Status: DC
  Filled 2014-02-06: qty 20

## 2014-02-06 MED ORDER — PANTOPRAZOLE SODIUM 40 MG PO TBEC
40.0000 mg | DELAYED_RELEASE_TABLET | Freq: Every day | ORAL | Status: DC
  Administered 2014-02-07 – 2014-02-08 (×2): 40 mg via ORAL
  Filled 2014-02-06 (×2): qty 1

## 2014-02-06 MED ORDER — NALOXONE HCL 1 MG/ML IJ SOLN: 1.0000 ug/kg/h | INTRAVENOUS | Status: DC | PRN

## 2014-02-06 MED ORDER — DEXAMETHASONE SODIUM PHOSPHATE 10 MG/ML IJ SOLN: 10.0000 mg | Freq: Once | INTRAMUSCULAR | Status: DC

## 2014-02-06 MED ORDER — FINASTERIDE 1 MG PO TABS
1.0000 mg | ORAL_TABLET | Freq: Every day | ORAL | Status: DC
  Filled 2014-02-06 (×3): qty 1

## 2014-02-06 MED ORDER — NALBUPHINE HCL 20 MG/ML IJ SOLN
5.0000 mg | INTRAMUSCULAR | Status: DC | PRN
  Filled 2014-02-06: qty 1

## 2014-02-06 MED ORDER — MORPHINE SULFATE 0.5 MG/ML IJ SOLN
INTRAMUSCULAR | Status: AC
  Filled 2014-02-06: qty 10

## 2014-02-06 MED ORDER — POLYETHYLENE GLYCOL 3350 17 G PO PACK
17.0000 g | PACK | Freq: Two times a day (BID) | ORAL | Status: DC
  Administered 2014-02-07 – 2014-02-08 (×2): 17 g via ORAL

## 2014-02-06 MED ORDER — BUPIVACAINE-EPINEPHRINE (PF) 0.25% -1:200000 IJ SOLN
INTRAMUSCULAR | Status: AC
  Filled 2014-02-06: qty 30

## 2014-02-06 MED ORDER — MIDAZOLAM HCL 2 MG/2ML IJ SOLN
INTRAMUSCULAR | Status: AC
  Filled 2014-02-06: qty 2

## 2014-02-06 MED ORDER — MEPERIDINE HCL 50 MG/ML IJ SOLN: 6.2500 mg | INTRAMUSCULAR | Status: DC | PRN

## 2014-02-06 MED ORDER — CEFAZOLIN SODIUM 1-5 GM-% IV SOLN
1.0000 g | Freq: Once | INTRAVENOUS | Status: AC
  Filled 2014-02-06: qty 50

## 2014-02-06 MED ORDER — METHOCARBAMOL 500 MG PO TABS
500.0000 mg | ORAL_TABLET | Freq: Four times a day (QID) | ORAL | Status: DC | PRN
  Administered 2014-02-06 – 2014-02-08 (×4): 500 mg via ORAL
  Filled 2014-02-06 (×4): qty 1

## 2014-02-06 MED ORDER — HYDROMORPHONE HCL PF 1 MG/ML IJ SOLN
0.5000 mg | INTRAMUSCULAR | Status: DC | PRN
  Administered 2014-02-06 (×2): 1 mg via INTRAVENOUS
  Administered 2014-02-06: 2 mg via INTRAVENOUS
  Administered 2014-02-06: 0.5 mg via INTRAVENOUS
  Administered 2014-02-06 – 2014-02-07 (×2): 1 mg via INTRAVENOUS
  Filled 2014-02-06 (×2): qty 1
  Filled 2014-02-06 (×2): qty 2
  Filled 2014-02-06: qty 1

## 2014-02-06 MED ORDER — BUPIVACAINE-EPINEPHRINE 0.25% -1:200000 IJ SOLN
INTRAMUSCULAR | Status: DC | PRN
  Administered 2014-02-06 (×2): 15 mL

## 2014-02-06 MED ORDER — METOCLOPRAMIDE HCL 10 MG PO TABS: 5.0000 mg | ORAL_TABLET | Freq: Three times a day (TID) | ORAL | Status: DC | PRN

## 2014-02-06 MED ORDER — ALUM & MAG HYDROXIDE-SIMETH 200-200-20 MG/5ML PO SUSP
30.0000 mL | ORAL | Status: DC | PRN
Start: 2014-02-06 — End: 2014-02-08

## 2014-02-06 MED ORDER — DEXAMETHASONE SODIUM PHOSPHATE 10 MG/ML IJ SOLN
10.0000 mg | Freq: Once | INTRAMUSCULAR | Status: AC
  Administered 2014-02-07: 10 mg via INTRAVENOUS
  Filled 2014-02-06: qty 1

## 2014-02-06 MED ORDER — AMLODIPINE BESYLATE 5 MG PO TABS
5.0000 mg | ORAL_TABLET | Freq: Every morning | ORAL | Status: DC
  Administered 2014-02-07 – 2014-02-08 (×2): 5 mg via ORAL
  Filled 2014-02-06 (×2): qty 1

## 2014-02-06 MED ORDER — FERROUS SULFATE 325 (65 FE) MG PO TABS
325.0000 mg | ORAL_TABLET | Freq: Three times a day (TID) | ORAL | Status: DC
  Administered 2014-02-06 – 2014-02-07 (×3): 325 mg via ORAL
  Filled 2014-02-06 (×9): qty 1

## 2014-02-06 MED ORDER — DOCUSATE SODIUM 100 MG PO CAPS
100.0000 mg | ORAL_CAPSULE | Freq: Two times a day (BID) | ORAL | Status: DC
  Administered 2014-02-06 – 2014-02-08 (×4): 100 mg via ORAL

## 2014-02-06 MED ORDER — CEFAZOLIN SODIUM-DEXTROSE 2-3 GM-% IV SOLR
2.0000 g | Freq: Once | INTRAVENOUS | Status: DC
  Administered 2014-02-06: 1 g via INTRAVENOUS

## 2014-02-06 MED ORDER — DABIGATRAN ETEXILATE MESYLATE 150 MG PO CAPS
150.0000 mg | ORAL_CAPSULE | Freq: Two times a day (BID) | ORAL | Status: DC
  Filled 2014-02-06 (×4): qty 1

## 2014-02-06 MED ORDER — MORPHINE SULFATE (PF) 0.5 MG/ML IJ SOLN
INTRAMUSCULAR | Status: DC | PRN
  Administered 2014-02-06: 150 ug via INTRATHECAL

## 2014-02-06 MED ORDER — PROPOFOL INFUSION 10 MG/ML OPTIME
INTRAVENOUS | Status: DC | PRN
  Administered 2014-02-06: 140 ug/kg/min via INTRAVENOUS

## 2014-02-06 MED ORDER — NALOXONE HCL 0.4 MG/ML IJ SOLN: 0.4000 mg | INTRAMUSCULAR | Status: DC | PRN

## 2014-02-06 MED ORDER — BISACODYL 10 MG RE SUPP: 10.0000 mg | Freq: Every day | RECTAL | Status: DC | PRN

## 2014-02-06 MED ORDER — DABIGATRAN ETEXILATE MESYLATE 150 MG PO CAPS
150.0000 mg | ORAL_CAPSULE | Freq: Two times a day (BID) | ORAL | Status: DC
  Filled 2014-02-06: qty 1

## 2014-02-06 MED ORDER — METOCLOPRAMIDE HCL 5 MG/ML IJ SOLN: 5.0000 mg | Freq: Three times a day (TID) | INTRAMUSCULAR | Status: DC | PRN

## 2014-02-06 MED ORDER — OXYCODONE HCL 5 MG PO TABS: 5.0000 mg | ORAL_TABLET | Freq: Once | ORAL | Status: DC | PRN

## 2014-02-06 MED ORDER — PHENYLEPHRINE HCL 10 MG/ML IJ SOLN
INTRAMUSCULAR | Status: AC
  Filled 2014-02-06: qty 1

## 2014-02-06 MED ORDER — IRBESARTAN 300 MG PO TABS
300.0000 mg | ORAL_TABLET | Freq: Every day | ORAL | Status: DC
  Administered 2014-02-06 – 2014-02-07 (×2): 300 mg via ORAL
  Filled 2014-02-06 (×3): qty 1

## 2014-02-06 MED ORDER — ONDANSETRON HCL 4 MG/2ML IJ SOLN: 4.0000 mg | Freq: Three times a day (TID) | INTRAMUSCULAR | Status: DC | PRN

## 2014-02-06 MED ORDER — LACTATED RINGERS IV SOLN: INTRAVENOUS | Status: DC

## 2014-02-06 MED ORDER — FENTANYL CITRATE 0.05 MG/ML IJ SOLN
INTRAMUSCULAR | Status: DC | PRN
  Administered 2014-02-06: 50 ug via INTRAVENOUS

## 2014-02-06 MED ORDER — CARVEDILOL 3.125 MG PO TABS
3.1250 mg | ORAL_TABLET | Freq: Two times a day (BID) | ORAL | Status: DC
  Administered 2014-02-06 – 2014-02-08 (×4): 3.125 mg via ORAL
  Filled 2014-02-06 (×6): qty 1

## 2014-02-06 MED ORDER — DOFETILIDE 500 MCG PO CAPS
500.0000 ug | ORAL_CAPSULE | Freq: Two times a day (BID) | ORAL | Status: DC
  Administered 2014-02-06 – 2014-02-08 (×4): 500 ug via ORAL
  Filled 2014-02-06 (×6): qty 1

## 2014-02-06 MED ORDER — CELECOXIB 200 MG PO CAPS
200.0000 mg | ORAL_CAPSULE | Freq: Two times a day (BID) | ORAL | Status: DC
  Administered 2014-02-06 – 2014-02-08 (×4): 200 mg via ORAL
  Filled 2014-02-06 (×5): qty 1

## 2014-02-06 MED ORDER — DIPHENHYDRAMINE HCL 50 MG/ML IJ SOLN: 12.5000 mg | INTRAMUSCULAR | Status: DC | PRN

## 2014-02-06 MED ORDER — METOCLOPRAMIDE HCL 5 MG/ML IJ SOLN: 10.0000 mg | Freq: Three times a day (TID) | INTRAMUSCULAR | Status: DC | PRN

## 2014-02-06 MED ORDER — FENTANYL CITRATE 0.05 MG/ML IJ SOLN
INTRAMUSCULAR | Status: AC
  Filled 2014-02-06: qty 2

## 2014-02-06 MED ORDER — PHENOL 1.4 % MT LIQD
1.0000 | OROMUCOSAL | Status: DC | PRN
  Filled 2014-02-06: qty 177

## 2014-02-06 MED ORDER — CHLORHEXIDINE GLUCONATE 4 % EX LIQD: 60.0000 mL | Freq: Once | CUTANEOUS | Status: DC

## 2014-02-06 MED ORDER — DIPHENHYDRAMINE HCL 25 MG PO CAPS
25.0000 mg | ORAL_CAPSULE | ORAL | Status: DC | PRN
  Administered 2014-02-06: 25 mg via ORAL

## 2014-02-06 MED ORDER — PROMETHAZINE HCL 25 MG/ML IJ SOLN: 6.2500 mg | INTRAMUSCULAR | Status: DC | PRN

## 2014-02-06 MED ORDER — DEXTROSE 5 % IV SOLN
500.0000 mg | Freq: Four times a day (QID) | INTRAVENOUS | Status: DC | PRN
  Filled 2014-02-06: qty 5

## 2014-02-06 MED ORDER — SODIUM CHLORIDE 0.9 % IR SOLN
Status: DC | PRN
  Administered 2014-02-06: 1000 mL

## 2014-02-06 MED ORDER — SODIUM CHLORIDE 0.9 % IJ SOLN: 3.0000 mL | INTRAMUSCULAR | Status: DC | PRN

## 2014-02-06 MED ORDER — BUPIVACAINE HCL (PF) 0.5 % IJ SOLN
INTRAMUSCULAR | Status: DC | PRN
  Administered 2014-02-06: 3 mL

## 2014-02-06 MED ORDER — HYDROMORPHONE HCL PF 1 MG/ML IJ SOLN: 0.2500 mg | INTRAMUSCULAR | Status: DC | PRN

## 2014-02-06 MED ORDER — ONDANSETRON HCL 4 MG/2ML IJ SOLN
4.0000 mg | Freq: Four times a day (QID) | INTRAMUSCULAR | Status: DC | PRN
  Administered 2014-02-06: 4 mg via INTRAVENOUS
  Filled 2014-02-06: qty 2

## 2014-02-06 MED ORDER — DIPHENHYDRAMINE HCL 25 MG PO CAPS
25.0000 mg | ORAL_CAPSULE | Freq: Four times a day (QID) | ORAL | Status: DC | PRN
  Filled 2014-02-06: qty 1

## 2014-02-06 SURGICAL SUPPLY — 60 items
ADH SKN CLS APL DERMABOND .7 (GAUZE/BANDAGES/DRESSINGS) ×2
BAG SPEC THK2 15X12 ZIP CLS (MISCELLANEOUS) ×1
BAG ZIPLOCK 12X15 (MISCELLANEOUS) ×3 IMPLANT
BANDAGE ELASTIC 6 VELCRO ST LF (GAUZE/BANDAGES/DRESSINGS) ×4 IMPLANT
BANDAGE ESMARK 6X9 LF (GAUZE/BANDAGES/DRESSINGS) ×1 IMPLANT
BLADE SAW RECIPROCATING 77.5 (BLADE) ×4 IMPLANT
BLADE SAW SGTL 13.0X1.19X90.0M (BLADE) ×4 IMPLANT
BNDG CMPR 9X6 STRL LF SNTH (GAUZE/BANDAGES/DRESSINGS) ×1
BNDG COHESIVE 4X5 TAN STRL (GAUZE/BANDAGES/DRESSINGS) ×3 IMPLANT
BNDG ESMARK 6X9 LF (GAUZE/BANDAGES/DRESSINGS) ×3
BOWL SMART MIX CTS (DISPOSABLE) ×6 IMPLANT
CAPT RP KNEE ×4 IMPLANT
CEMENT HV SMART SET (Cement) ×10 IMPLANT
CUFF TOURN SGL QUICK 34 (TOURNIQUET CUFF) ×6
CUFF TRNQT CYL 34X4X40X1 (TOURNIQUET CUFF) ×2 IMPLANT
DERMABOND ADVANCED (GAUZE/BANDAGES/DRESSINGS) ×4
DERMABOND ADVANCED .7 DNX12 (GAUZE/BANDAGES/DRESSINGS) ×2 IMPLANT
DRAPE EXTREMITY BILATERAL (DRAPE) ×3 IMPLANT
DRAPE POUCH INSTRU U-SHP 10X18 (DRAPES) ×3 IMPLANT
DRAPE U-SHAPE 47X51 STRL (DRAPES) ×7 IMPLANT
DRSG AQUACEL AG ADV 3.5X10 (GAUZE/BANDAGES/DRESSINGS) ×6 IMPLANT
DRSG TEGADERM 4X4.75 (GAUZE/BANDAGES/DRESSINGS) ×2 IMPLANT
DURAPREP 26ML APPLICATOR (WOUND CARE) ×5 IMPLANT
ELECT REM PT RETURN 9FT ADLT (ELECTROSURGICAL) ×3
ELECTRODE REM PT RTRN 9FT ADLT (ELECTROSURGICAL) ×1 IMPLANT
GAUZE SPONGE 2X2 8PLY STRL LF (GAUZE/BANDAGES/DRESSINGS) ×2 IMPLANT
GLOVE BIOGEL PI IND STRL 7.5 (GLOVE) ×1 IMPLANT
GLOVE BIOGEL PI IND STRL 8.5 (GLOVE) ×1 IMPLANT
GLOVE BIOGEL PI INDICATOR 7.5 (GLOVE) ×6
GLOVE BIOGEL PI INDICATOR 8.5 (GLOVE) ×6
GLOVE ECLIPSE 8.0 STRL XLNG CF (GLOVE) ×6 IMPLANT
GLOVE ORTHO TXT STRL SZ7.5 (GLOVE) ×6 IMPLANT
GOWN SPEC L3 XXLG W/TWL (GOWN DISPOSABLE) ×6 IMPLANT
GOWN STRL REUS W/TWL LRG LVL3 (GOWN DISPOSABLE) ×3 IMPLANT
HANDPIECE INTERPULSE COAX TIP (DISPOSABLE) ×3
IMMOBILIZER KNEE 20 (SOFTGOODS)
IMMOBILIZER KNEE 20 THIGH 36 (SOFTGOODS) ×2 IMPLANT
KIT BASIN OR (CUSTOM PROCEDURE TRAY) ×3 IMPLANT
MANIFOLD NEPTUNE II (INSTRUMENTS) ×3 IMPLANT
NS IRRIG 1000ML POUR BTL (IV SOLUTION) ×4 IMPLANT
PACK TOTAL JOINT (CUSTOM PROCEDURE TRAY) ×3 IMPLANT
PADDING CAST COTTON 6X4 STRL (CAST SUPPLIES) ×6 IMPLANT
POSITIONER SURGICAL ARM (MISCELLANEOUS) ×3 IMPLANT
SET HNDPC FAN SPRY TIP SCT (DISPOSABLE) ×1 IMPLANT
SET PAD KNEE POSITIONER (MISCELLANEOUS) ×2 IMPLANT
SPONGE GAUZE 2X2 STER 10/PKG (GAUZE/BANDAGES/DRESSINGS)
SPONGE LAP 18X18 X RAY DECT (DISPOSABLE) ×5 IMPLANT
STOCKINETTE 8 INCH (MISCELLANEOUS) ×3 IMPLANT
SUCTION FRAZIER 12FR DISP (SUCTIONS) ×3 IMPLANT
SUT MNCRL AB 4-0 PS2 18 (SUTURE) ×6 IMPLANT
SUT VIC AB 1 CT1 27 (SUTURE) ×6
SUT VIC AB 1 CT1 27XBRD ANTBC (SUTURE) ×2 IMPLANT
SUT VIC AB 1 CT1 36 (SUTURE) ×6 IMPLANT
SUT VIC AB 2-0 CT1 27 (SUTURE) ×18
SUT VIC AB 2-0 CT1 TAPERPNT 27 (SUTURE) ×6 IMPLANT
SUT VLOC 180 0 24IN GS25 (SUTURE) ×6 IMPLANT
TOWEL OR 17X26 10 PK STRL BLUE (TOWEL DISPOSABLE) ×6 IMPLANT
TRAY FOLEY METER SIL LF 16FR (CATHETERS) ×2 IMPLANT
WATER STERILE IRR 1500ML POUR (IV SOLUTION) ×3 IMPLANT
WRAP KNEE MAXI GEL POST OP (GAUZE/BANDAGES/DRESSINGS) ×6 IMPLANT

## 2014-02-06 NOTE — Interval H&P Note (Signed)
History and Physical Interval Note:  02/06/2014 8:36 AM  Travis [name]  has presented today for surgery, with the diagnosis of BILATERAL KNEE OA   The various methods of treatment have been discussed with the patient and family. After consideration of risks, benefits and other options for treatment, the patient has consented to  Procedure(s): TOTAL KNEE BILATERAL (Bilateral) as a surgical intervention .  The patient's history has been reviewed, patient examined, no change in status, stable for surgery.  I have reviewed the patient's chart and labs.  Questions were answered to the patient's satisfaction.     Mauri Pole

## 2014-02-06 NOTE — Anesthesia Procedure Notes (Signed)
Spinal  Patient location during procedure: OR Staffing Anesthesiologist: Nolon Nations R Performed by: anesthesiologist  Preanesthetic Checklist Completed: patient identified, site marked, surgical consent, pre-op evaluation, timeout performed, IV checked, risks and benefits discussed and monitors and equipment checked Spinal Block Patient position: sitting Prep: Betadine Patient monitoring: heart rate, continuous pulse ox and blood pressure Approach: left paramedian Location: L3-4 Injection technique: single-shot Needle Needle type: Spinocan  Needle gauge: 22 G Needle length: 9 cm Assessment Sensory level: T10 Additional Notes Expiration date of kit checked and confirmed. Patient tolerated procedure well, without complications.

## 2014-02-06 NOTE — Anesthesia Postprocedure Evaluation (Signed)
Anesthesia Post Note  Patient: Travis [name]  Procedure(s) Performed: Procedure(s) (LRB): TOTAL KNEE BILATERAL (Bilateral)  Anesthesia type: Spinal  Patient location: PACU  Post pain: Pain level controlled  Post assessment: Post-op Vital signs reviewed  Last Vitals: BP 103/62  Pulse 48  Temp(Src) 36.5 C (Oral)  Resp 15  Ht 5\' 8"  (1.727 m)  Wt 183 lb (83.008 kg)  BMI 27.83 kg/m2  SpO2 100%  Post vital signs: Reviewed  Level of consciousness: sedated  Complications: No apparent anesthesia complications

## 2014-02-06 NOTE — Progress Notes (Signed)
Utilization review completed.  

## 2014-02-06 NOTE — Transfer of Care (Signed)
Immediate Anesthesia Transfer of Care Note  Patient: Travis [name]  Procedure(s) Performed: Procedure(s): TOTAL KNEE BILATERAL (Bilateral)  Patient Location: PACU  Anesthesia Type:Spinal  Level of Consciousness: awake, oriented, patient cooperative and responds to stimulation  Airway & Oxygen Therapy: Patient Spontanous Breathing and Patient connected to face mask oxygen  Post-op Assessment: Report given to PACU RN and Post -op Vital signs reviewed and stable  Post vital signs: Reviewed and stable  Complications: No apparent anesthesia complications

## 2014-02-06 NOTE — Progress Notes (Signed)
[facility]   [facility] is providing the following services: Patient declined rw and commode - has both at home already.   If patient discharges after hours, please call [phone].   Linward Headland 02/06/2014, 3:50 PM

## 2014-02-06 NOTE — Op Note (Signed)
NAME:  Travis [name]                      MEDICAL RECORD NO.:  [id number]                             FACILITY:  [facility]      PHYSICIAN:  Pietro Cassis. Alvan Dame, M.D.  DATE OF BIRTH:  [birth year]      DATE OF PROCEDURE:  02/06/2014                                     OPERATIVE REPORT         PREOPERATIVE DIAGNOSIS:  Bilateral knee osteoarthritis.      POSTOPERATIVE DIAGNOSIS:  Bilateral knee osteoarthritis.      FINDINGS:  The patient was noted to have complete loss of cartilage and   bone-on-bone arthritis with associated osteophytes in the medial and patellofemoral compartments of   the knee with a significant synovitis and associated effusion.      PROCEDURE:  Bilateral total knee replacement.  Left total knee first, then right.  In this format the left knee is first.     COMPONENTS USED:  DePuy rotating platform posterior stabilized knee   system, a size 4N femur, 4 tibia, 12.5 mm PS insert, and 41 patellar   button.      SURGEON:  Pietro Cassis. Alvan Dame, M.D.      ASSISTANT:  Danae Orleans, PA-C      ANESTHESIA:  Spinal Duramorph     SPECIMENS:  None.      COMPLICATION:  None.      DRAINS:  None  EBL: <100cc     TOURNIQUET TIME:   Total Tourniquet Time Documented: Thigh (Left) - 34 minutes on left at 251mmHg Thigh (Left) - 37 minutes on the right Total: Thigh (Left) - 71 minutes  .      The patient was stable to the recovery room.      INDICATION FOR PROCEDURE:  Travis [name] is a 73 y.o. male patient of   mine.  The patient had been seen, evaluated, and treated conservatively in the   office with medication, activity modification, and injections.  The patient had   radiographic changes of bone-on-bone arthritis with endplate sclerosis and osteophytes noted on both knees.      He had failed conservative measures including medication, injections, and activity modification, and at this point was ready for more definitive measures.   Based on the radiographic changes and  failed conservative measures, the patient   decided to proceed with total knee replacement.  Risks of infection,   DVT, component failure, need for revision surgery, postop course, and   expectations were all discussed and reviewed.  Consent was obtained for benefit of pain   relief.  Specific risks and concerns of bilateral procedure was reviewed.  He elected to proceed in this fashion to avoid 2 anesthetics.     PROCEDURE IN DETAIL:  The patient was brought to the operative theater.   Once adequate anesthesia, preoperative antibiotics, 2gm of Ancef administered, the patient was positioned supine with the both thigh tourniquets were  placed.  Both lower extremities was prepped and draped in sterile fashion.  A time-   out was performed identifying the patient, planned procedure, and   extremity.  The both lower extremities were placed in the [facility] leg holder.  The left leg was   exsanguinated first, tourniquet elevated to 250 mmHg.  A midline incision was   made followed by median parapatellar arthrotomy.  Following initial   exposure, attention was first directed to the patella.  Precut   measurement was noted to be 23 mm.  I resected down to 14 mm and used a   41 patellar button to restore patellar height as well as cover the cut   surface.      The lug holes were drilled and a metal shim was placed to protect the   patella from retractors and saw blades.      At this point, attention was now directed to the femur.  The femoral   canal was opened with a drill, irrigated to try to prevent fat emboli.  An   intramedullary rod was passed at 3 degrees valgus, 9 mm of bone was   resected off the distal femur.  Following this resection, the tibia was   subluxated anteriorly.  Using the extramedullary guide, 2-4 mm of bone was resected off   the proximal medium tibia.  We confirmed the gap would be   stable medially and laterally with a 10 mm insert as well as confirmed   the cut was  perpendicular in the coronal plane, checking with an alignment rod.      Once this was done, I sized the femur to be a size 4 in the anterior-   posterior dimension, chose a narrow component based on medial and   lateral dimension.  The size 4 rotation block was then pinned in   position anterior referenced using the C-clamp to set rotation.  The   anterior, posterior, and  chamfer cuts were made without difficulty nor   notching making certain that I was along the anterior cortex to help   with flexion gap stability.      The final box cut was made off the lateral aspect of distal femur.      At this point, the tibia was sized to be a size 4, the size 4 tray was   then pinned in position through the medial third of the tubercle,   drilled, and keel punched.  Trial reduction was now carried with a 4N femur,  4 tibia, a 10 the 12.5 mm insert, and the 41 patella botton.  The knee was brought to   extension, full extension with good flexion stability with the patella   tracking through the trochlea without application of pressure.  Given   all these findings, the trial components removed.  Final components were   opened and cement was mixed.  The knee was irrigated with normal saline   solution and pulse lavage.  The synovial lining was   then injected with 10cc of Exparel, 15cc of 0.25% Marcaine with epinephrine for a total of 25cc.      The knee was irrigated.  Final implants were then cemented onto clean and   dried cut surfaces of bone with the knee brought to extension with a 12.30mm trial insert.      Once the cement had fully cured, the excess cement was removed   throughout the knee.  I confirmed I was satisfied with the range of   motion and stability, and the final 12.5 mm PS insert was chosen.  It was   placed into the knee.  The tourniquet had been let down at 34 minutes.  No significant   hemostasis required.  The   extensor mechanism was then reapproximated using #1  Vicryl and #0 v-lock sutures with the knee   in flexion.  The   remaining wound was closed with 2-0 Vicryl and running 4-0 Monocryl.   The knee was cleaned, dried, dressed sterilely using Dermabond and   Aquacel dressing.  Upon completion of the left knee we changed sides to begin working on the right knee while the left was being closed.  Please note that Physician Assistant, Danae Orleans, was present for the entirety of the case, and was utilized for pre-operative positioning, peri-operative retractor management, general facilitation of the procedure.  He was also utilized for primary wound closure at the end of the case.    02/06/2014 12:22 PM  NAME:  Travis [name]                      MEDICAL RECORD NO.:  [id number]                             FACILITY:  [facility]      PHYSICIAN:  Pietro Cassis. Alvan Dame, M.D.  DATE OF BIRTH:  [birth year]      DATE OF PROCEDURE:  02/06/2014                                     OPERATIVE REPORT         PREOPERATIVE DIAGNOSIS:  Bilateral knee osteoarthritis.      POSTOPERATIVE DIAGNOSIS:  Bilateral knee osteoarthritis.      FINDINGS:  The patient was noted to have complete loss of cartilage and   bone-on-bone arthritis with associated osteophytes in the medial and patellofemoral compartments of   the knee with a significant synovitis and associated effusion.      PROCEDURE:  Right total knee replacement.      COMPONENTS USED:  DePuy rotating platform posterior stabilized knee   system, a size 4N femur, 4 tibia, 10 mm PS insert, and 41 patellar   button.      SURGEON:  Pietro Cassis. Alvan Dame, M.D.      ASSISTANT:  Danae Orleans, PA-C.      ANESTHESIA:  Spinal with Duramorph     SPECIMENS:  None.      COMPLICATION:  None.      DRAINS:  None.  EBL: <100cc      TOURNIQUET TIME:   Total Tourniquet Time Documented: Thigh (Left) - 34 minutes Thigh (Left) - 37 minutes for the right knee Total: Thigh (Left) - 71 minutes  .      The patient was stable to  the recovery room.      INDICATION FOR PROCEDURE:  Same indiciations      PROCEDURE IN DETAIL:  The patient was brought to the operative theater.   Once adequate anesthesia, preoperative antibiotics an additional 1 gm of Ancef administered.  A second time-   out was performed identifying the patient, planned procedure, and   extremity.      The right lower extremity was placed in the [facility] leg holder.  The right leg was   exsanguinated, tourniquet elevated to 250 mmHg.  A midline incision was   made followed by median parapatellar arthrotomy.  Following initial   exposure, attention  was first directed to the patella.  Precut   measurement was noted to be 23 mm.  I resected down to 14 mm and used a   41 patellar button to restore patellar height as well as cover the cut   surface.      The lug holes were drilled and a metal shim was placed to protect the   patella from retractors and saw blades.      At this point, attention was now directed to the femur.  The femoral   canal was opened with a drill, irrigated to try to prevent fat emboli.  An   intramedullary rod was passed at 3 degrees valgus, 9 mm of bone was   resected off the distal femur.  Following this resection, the tibia was   subluxated anteriorly.  Using the extramedullary guide, 2 mm of bone was resected off   the proximal meidal tibia.  We confirmed the gap would be   stable medially and laterally with a 10 mm insert as well as confirmed   the cut was perpendicular in the coronal plane, checking with an alignment rod.      Once this was done, I sized the femur to be a size 4 in the anterior-   posterior dimension, chose a narrow component based on medial and   lateral dimension.  The size 4 rotation block was then pinned in   position anterior referenced using the C-clamp to set rotation.  The   anterior, posterior, and  chamfer cuts were made without difficulty nor   notching making certain that I was along the  anterior cortex to help   with flexion gap stability.      The final box cut was made off the lateral aspect of distal femur.      At this point, the tibia was sized to be a size 4, the size 4 tray was   then pinned in position through the medial third of the tubercle,   drilled, and keel punched.  Trial reduction was now carried with a 4N femur,  4 tibia, a 10 mm PS insert, and the 41 patella botton.  The knee was brought to   extension, full extension with good flexion stability with the patella   tracking through the trochlea without application of pressure.  Given   all these findings, the trial components removed.  Final components were   opened and cement was mixed.  The knee was irrigated with normal saline   solution and pulse lavage.  The synovial lining was   then injected with 0.25% Marcaine with epinephrine and 1 cc of Toradol,   total of 61 cc.      The knee was irrigated.  Final implants were then cemented onto clean and   dried cut surfaces of bone with the knee brought to extension with a 10   mm PS trial insert.      Once the cement had fully cured, the excess cement was removed   throughout the knee.  I confirmed I was satisfied with the range of   motion and stability, and the final 10 mm PS insert was chosen.  It was   placed into the knee.      The tourniquet had been let down at 37 minutes.  No significant   hemostasis required.  The   extensor mechanism was then reapproximated using #1 Vicryl with the knee   in flexion.  The   remaining wound  was closed with 2-0 Vicryl and running 4-0 Monocryl.   The knee was cleaned, dried, dressed sterilely using Dermabond and   Aquacel dressing.  Upon completion of both knees he was then   brought to recovery room in stable condition, tolerating the procedure   well.   Please note that Physician Assistant, Danae Orleans, was present for the entirety of both cases, and was utilized for pre-operative positioning,  peri-operative retractor management, general facilitation of the procedure.  He was also utilized for primary wound closure at the end of the case.              Pietro Cassis Alvan Dame, M.D.    02/06/2014 12:22 PM

## 2014-02-06 NOTE — Evaluation (Signed)
Physical Therapy Evaluation Patient Details Name: Travis [name] MRN: [id number] DOB: 27-Dec-1940 Today's Date: 02/06/2014   History of Present Illness  Bilateral TKA's 02/06/14  Clinical Impression  Patient is very motivated to get up, c/o dizziness in bed. Only sat upright in bed, began to shake , became nauseated . RN aware. Did not attempt dangle at the edge of the bed.   BP sup-136/71, sitting upright/bed 136/71, sitting in semi bed/ chair position 130/67,  HR 49-53 Patient will benefit from PT to address problems listed in note below. Patient wants to DC to home.    Follow Up Recommendations Home health PT;Supervision/Assistance - 24 hour    Equipment Recommendations  None recommended by PT    Recommendations for Other Services       Precautions / Restrictions Precautions Precautions: Fall;Knee Precaution Comments: monitor VS      Mobility  Bed Mobility Overal bed mobility: Needs Assistance Bed Mobility: Supine to Sit     Supine to sit: Supervision     General bed mobility comments: only sat forward in bed using rails, did not turn and sit on the edge due to dizziness, then shaking chills.placed bed in semi chair position for about 4 minutes with increase in nausea and dizziness. returned to supine. RN notified o.  Transfers                    Ambulation/Gait                Stairs            Wheelchair Mobility    Modified Rankin (Stroke Patients Only)       Balance                                             Pertinent Vitals/Pain Pain Assessment: 0-10 Pain Score: 6  Pain Location: < 2 initially, after  gently bending in bed, increased to 6,  Pain Descriptors / Indicators: Aching Pain Intervention(s): Monitored during session;Patient requesting pain meds-RN notified;Ice applied;Repositioned    Home Living Family/patient expects to be discharged to:: Private residence Living Arrangements: Other relatives  (brother) Available Help at Discharge: Family Type of Home: House Home Access: Stairs to enter Entrance Stairs-Rails: None Entrance Stairs-Number of Steps: 2 Home Layout: One level Home Equipment: Environmental consultant - 2 wheels;Bedside commode;Tub bench;Cane - single point      Prior Function Level of Independence: Independent               Hand Dominance        Extremity/Trunk Assessment   Upper Extremity Assessment: Overall WFL for tasks assessed           Lower Extremity Assessment: RLE deficits/detail;LLE deficits/detail RLE Deficits / Details: able to lift from bed, flexed knee to 30 LLE Deficits / Details: same as R     Communication   Communication: No difficulties  Cognition Arousal/Alertness: Awake/alert Behavior During Therapy: WFL for tasks assessed/performed Overall Cognitive Status: Within Functional Limits for tasks assessed                      General Comments      Exercises        Assessment/Plan    PT Assessment Patient needs continued PT services  PT Diagnosis Difficulty walking;Acute pain   PT Problem List Decreased range of  motion;Decreased strength;Decreased activity tolerance;Decreased mobility;Decreased knowledge of precautions;Decreased safety awareness;Decreased knowledge of use of DME;Pain  PT Treatment Interventions DME instruction;Gait training;Stair training;Functional mobility training;Therapeutic activities;Therapeutic exercise;Patient/family education   PT Goals (Current goals can be found in the Care Plan section) Acute Rehab PT Goals Patient Stated Goal: to go home/ PT Goal Formulation: With patient Time For Goal Achievement: 02/13/14 Potential to Achieve Goals: Good    Frequency 7X/week   Barriers to discharge        Co-evaluation               End of Session   Activity Tolerance: Treatment limited secondary to medical complications (Comment) (nausea, dizziness.) Patient left: in bed;with call bell/phone  within reach;with nursing/sitter in room Nurse Communication: Mobility status         Time: 1701-1735 PT Time Calculation (min): 34 min   Charges:   PT Evaluation $Initial PT Evaluation Tier I: 1 Procedure PT Treatments $Therapeutic Activity: 23-37 mins   PT G Codes:          [name] 02/06/2014, 6:39 PM Tresa Endo PT [phone]

## 2014-02-07 ENCOUNTER — Encounter (HOSPITAL_COMMUNITY): Payer: Self-pay | Admitting: Orthopedic Surgery

## 2014-02-07 LAB — CBC
HCT: 35.9 % — ABNORMAL LOW (ref 39.0–52.0)
HEMOGLOBIN: 12.6 g/dL — AB (ref 13.0–17.0)
MCH: 34.5 pg — ABNORMAL HIGH (ref 26.0–34.0)
MCHC: 35.1 g/dL (ref 30.0–36.0)
MCV: 98.4 fL (ref 78.0–100.0)
Platelets: 180 10*3/uL (ref 150–400)
RBC: 3.65 MIL/uL — AB (ref 4.22–5.81)
RDW: 13.4 % (ref 11.5–15.5)
WBC: 7.9 10*3/uL (ref 4.0–10.5)

## 2014-02-07 LAB — BASIC METABOLIC PANEL
Anion gap: 11 (ref 5–15)
BUN: 13 mg/dL (ref 6–23)
CALCIUM: 8.4 mg/dL (ref 8.4–10.5)
CO2: 26 meq/L (ref 19–32)
CREATININE: 0.61 mg/dL (ref 0.50–1.35)
Chloride: 95 mEq/L — ABNORMAL LOW (ref 96–112)
GFR calc Af Amer: >90 mL/min (ref >90)
GLUCOSE: 171 mg/dL — AB (ref 70–99)
Potassium: 3.7 mEq/L (ref 3.7–5.3)
Sodium: 132 mEq/L — ABNORMAL LOW (ref 137–147)

## 2014-02-07 MED ORDER — DABIGATRAN ETEXILATE MESYLATE 75 MG PO CAPS
75.0000 mg | ORAL_CAPSULE | Freq: Two times a day (BID) | ORAL | Status: AC
  Administered 2014-02-07 (×2): 75 mg via ORAL
  Filled 2014-02-07 (×2): qty 1

## 2014-02-07 MED ORDER — DABIGATRAN ETEXILATE MESYLATE 150 MG PO CAPS
150.0000 mg | ORAL_CAPSULE | Freq: Two times a day (BID) | ORAL | Status: DC
  Administered 2014-02-08: 150 mg via ORAL
  Filled 2014-02-07 (×2): qty 1

## 2014-02-07 NOTE — Progress Notes (Signed)
Physical Therapy Treatment Patient Details Name: ZEESHAN KORTE MRN: [id number] DOB: [birth year] Today's Date: 02/07/2014    History of Present Illness Bilateral TKA's 02/06/14    PT Comments    Patient expressed  Being anxious about walking  And did very well, cues for safety as pt is impulsive. Recommend CIR.  Follow Up Recommendations  CIR     Equipment Recommendations  None recommended by PT    Recommendations for Other Services       Precautions / Restrictions Precautions Precautions: Fall;Knee Precaution Comments: monitor VS, use L KI Required Braces or Orthoses: Knee Immobilizer - Left    Mobility  Bed Mobility Overal bed mobility: Independent Bed Mobility: Supine to Sit     Supine to sit: Min assist     General bed mobility comments: assist for legsto edge of bed, cues on technique  Transfers Overall transfer level: Needs assistance Equipment used: Rolling walker (2 wheeled) Transfers: Sit to/from Stand Sit to Stand: Mod assist;+2 safety/equipment;From elevated surface;+2 physical assistance         General transfer comment: cues for hand placement, safety, and R leg placement, pt did not follow instruction when sitting  to recliner and "Plopped"/decreased descent.   Ambulation/Gait Ambulation/Gait assistance: +2 safety/equipment;Min assist Ambulation Distance (Feet): 50 Feet Assistive device: Rolling walker (2 wheeled) Gait Pattern/deviations: Step-to pattern;Decreased step length - left;Decreased step length - right;Antalgic Gait velocity: decreased   General Gait Details: cues for sequence, position inside of RW.   Stairs            Wheelchair Mobility    Modified Rankin (Stroke Patients Only)       Balance                                    Cognition Arousal/Alertness: Awake/alert Behavior During Therapy: Anxious;Impulsive                        Exercises      General Comments        Pertinent  Vitals/Pain Pain Score: 2  Pain Descriptors / Indicators: Discomfort    Home Living                      Prior Function            PT Goals (current goals can now be found in the care plan section) Progress towards PT goals: Progressing toward goals    Frequency  7X/week    PT Plan Current plan remains appropriate    Co-evaluation             End of Session Equipment Utilized During Treatment: Gait belt;Left knee immobilizer Activity Tolerance: Patient tolerated treatment well Patient left: in chair;with call bell/phone within reach;with family/visitor present     Time: 5035-4656 PT Time Calculation (min): 20 min  Charges:  $Gait Training: 8-22 mins                    G Codes:      [name] 02/07/2014, 6:00 PM

## 2014-02-07 NOTE — Progress Notes (Signed)
Rehab Admissions Coordinator Note:  Patient was screened by Cleatrice Burke for appropriateness for an Inpatient Acute Rehab Consult per PT recommendation.  At this time, we are recommending Inpatient Rehab consult if pt would like to pursue an admission. Plans preop had been for [facility]  Cleatrice Burke 02/07/2014, 10:08 AM  I can be reached at [phone].

## 2014-02-07 NOTE — Progress Notes (Signed)
Physical Therapy Treatment Patient Details Name: Travis [name] MRN: [id number] DOB: 08/27/1940 Today's Date: 02/07/2014    History of Present Illness Bilateral TKA's 02/06/14    PT Comments    Patient reports that he is concerned about  The dizzy feeling continuously, with increase while sitting. BP  Was stable-supine=129/40; sitting x 15 " =128/69- HR 68-115. RN notified .Patient also expresses concern that his brother will not be adequate caregiver and wants to consider postop rehab. Recommend CIR consult. Patient should progress quickly once over dizziness. Patient is performing a very good QS on R, L is lagging. Also audible "squish" in R knee during ROM. Reports pain is mild.  Follow Up Recommendations  CIR     Equipment Recommendations  None recommended by PT    Recommendations for Other Services       Precautions / Restrictions Precautions Precautions: Fall;Knee Precaution Comments: monitor VS, use L KI Restrictions Weight Bearing Restrictions: No    Mobility  Bed Mobility Overal bed mobility: Needs Assistance Bed Mobility: Sit to Supine     Supine to sit: Min assist Sit to supine: Mod assist   General bed mobility comments: assist  for both  legs to edge of bed and to lower legs to the floor.. Then assist fro both legs onto bed, cues for technique, use of upper body and how to reciprocal scoot hips out to edge.  Transfers awaiting L KI for stability as it is weaker .                    Ambulation/Gait                 Stairs            Wheelchair Mobility    Modified Rankin (Stroke Patients Only)       Balance Overall balance assessment: Needs assistance Sitting-balance support: Feet supported;Bilateral upper extremity supported Sitting balance-Leahy Scale: Fair Sitting balance - Comments: does not feel confident to lean forward yet, increased dizziness.                            Cognition Arousal/Alertness:  Awake/alert Behavior During Therapy: Anxious (about  feeling dizzy and  standing up.)                        Exercises Total Joint Exercises Ankle Circles/Pumps: AROM;Both;10 reps;Supine Quad Sets: AROM;Both;10 reps;Supine Heel Slides: AAROM;Both;10 reps;Supine Hip ABduction/ADduction: AAROM;Both;10 reps;Supine Straight Leg Raises: Both;10 reps;Supine Long Arc Quad: AAROM;Both;10 reps;Seated Knee Flexion: AAROM;Both;10 reps;Seated Goniometric ROM: L= 10-40, R =5- 50    General Comments        Pertinent Vitals/Pain Pain Assessment: 0-10 Pain Score: 3  Pain Descriptors / Indicators: Discomfort Pain Intervention(s): Premedicated before session;Ice applied;Monitored during session    Home Living                      Prior Function            PT Goals (current goals can now be found in the care plan section) Progress towards PT goals: Progressing toward goals    Frequency  7X/week    PT Plan Discharge plan needs to be updated    Co-evaluation             End of Session   Activity Tolerance: Patient limited by fatigue Patient left: in bed;with call bell/phone within reach  Time: 7867-6720 PT Time Calculation (min): 35 min  Charges:  $Therapeutic Exercise: 8-22 mins $Therapeutic Activity: 8-22 mins                    G Codes:      [name] 02/07/2014, 9:44 AM Tresa Endo PT [phone]

## 2014-02-07 NOTE — Consult Note (Signed)
Physical Medicine and Rehabilitation Consult Reason for Consult: Bilateral TKA Referring Physician: Dr. Alvan Dame   HPI: Travis [name] is a 73 y.o. right-handed male with history of PAF maintained on chronic Coumadin therapy, bicuspid aortic valve. Admitted 02/06/2014 with end stage osteoarthritis bilateral knees and no relief with conservative care. Patient independent prior to admission with assistance from his brother. Onset of symptoms have been gradual over the last 3 years. Underwent bilateral total knee replacement 02/06/2014 per Dr. Alvan Dame. Postoperative pain management with pain medication adjusted secondary to some early lethargy. Patient is weightbearing as tolerated. Acute blood loss anemia 12.6 from admission hemoglobin 15.6. Physical therapy evaluation completed 02/06/2014. M.D. as requested physical medicine rehabilitation consult.   Review of Systems  Cardiovascular: Positive for palpitations and leg swelling.  Gastrointestinal: Positive for constipation.       GERD  Musculoskeletal: Positive for joint pain and myalgias.  Psychiatric/Behavioral: The patient has insomnia.   All other systems reviewed and are negative.  Past Medical History  Diagnosis Date  . Arthritis   . Hypertension   . GERD (gastroesophageal reflux disease)   . Bicuspid aortic valve   . 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 Sinus bradycardia   . Paroxysmal atrial fibrillation   . Cardiomyopathy -resolved     tachycardia-induced, EF 55% June 2009  . History of cardioversion 12/16/2012  . Left bundle branch block   . CHF (congestive heart failure)     JULY 2014  . History of stomach ulcers    Past Surgical History  Procedure Laterality Date  . Cerebral anuersym post clips    . Rotator cuff repair      lf  . Fractured left arm    . Left tendon repair      lft foot  . Hernia repair      lft  . Wrist ganglion excision      lft  . Tonsillectomy    . Inguinal hernia repair   07/02/2011    Procedure: LAPAROSCOPIC INGUINAL HERNIA;  Surgeon: Harl Bowie, MD;  Location: [facility];  Service: General;  Laterality: Left;  Laparoscopic left inguinal hernia repair and mesh  . Cardiac catheterization    . Cardioversion N/A 12/16/2012    Procedure: CARDIOVERSION;  Surgeon: Lelon Perla, MD;  Location: [facility];  Service: Cardiovascular;  Laterality: N/A;  . Total knee arthroplasty Bilateral 02/06/2014    Procedure: TOTAL KNEE BILATERAL;  Surgeon: Mauri Pole, MD;  Location: [facility];  Service: Orthopedics;  Laterality: Bilateral;   Family History  Problem Relation Age of Onset  . Stroke Father   . Lung cancer Mother 76  . Stroke Mother   . Colon cancer Neg Hx    Social History:  reports that he quit smoking about 27 years ago. He has never used smokeless tobacco. He reports that he drinks alcohol. He reports that he does not use illicit drugs. Allergies: No Known Allergies Medications Prior to Admission  Medication Sig Dispense Refill  . acetaminophen (TYLENOL) 500 MG tablet Take 1,000 mg by mouth every 6 (six) hours as needed for mild pain or moderate pain.      Marland Kitchen amLODipine (NORVASC) 5 MG tablet Take 5 mg by mouth every morning.      Marland Kitchen b complex vitamins tablet Take 1 tablet by mouth daily.       . carvedilol (COREG) 3.125 MG tablet Take 3.125 mg by mouth 2 (two) times daily with  a meal.      . dofetilide (TIKOSYN) 500 MCG capsule Take 500 mcg by mouth 2 (two) times daily.      . finasteride (PROPECIA) 1 MG tablet Take 1 mg by mouth daily.      . fish oil-omega-3 fatty acids 1000 MG capsule Take 1 g by mouth daily.       . Flaxseed, Linseed, (RA FLAX SEED OIL 1000 PO) Take 1,000 mg by mouth 3 (three) times daily.       . Glucosamine Sulfate (GLUCOSAMINE RELIEF) 1000 MG TABS Take 1 tablet by mouth 2 (two) times daily.       . meloxicam (MOBIC) 15 MG tablet Take 15 mg by mouth daily.      Marland Kitchen omeprazole (PRILOSEC) 20 MG capsule Take 20 mg by mouth daily.      Marland Kitchen  telmisartan (MICARDIS) 80 MG tablet Take 80 mg by mouth at bedtime.      . temazepam (RESTORIL) 15 MG capsule Take 15 mg by mouth at bedtime.      Marland Kitchen warfarin (COUMADIN) 1 MG tablet Take 1 mg by mouth daily.      . dabigatran (PRADAXA) 150 MG CAPS capsule Take 1 capsule (150 mg total) by mouth 2 (two) times daily.  60 capsule  5    Home: Home Living Family/patient expects to be discharged to:: Private residence Living Arrangements: Other relatives (brother) Available Help at Discharge: Family Type of Home: House Home Access: Stairs to enter Technical brewer of Steps: 2 Entrance Stairs-Rails: None Home Layout: One level Home Equipment: Environmental consultant - 2 wheels;Bedside commode;Tub bench;Cane - single point  Functional History: Prior Function Level of Independence: Independent Functional Status:  Mobility: Bed Mobility Overal bed mobility: Needs Assistance Bed Mobility: Sit to Supine Supine to sit: Min assist Sit to supine: Mod assist General bed mobility comments: assist  for both  legs to edge of bed and to lower legs to the floor.. Then assist fro both legs onto bed, cues for technique, use of upper body and how to reciprocal scoot hips out to edge.        ADL:    Cognition: Cognition Overall Cognitive Status: Within Functional Limits for tasks assessed Orientation Level: Oriented X4 Cognition Arousal/Alertness: Awake/alert Behavior During Therapy: Anxious (about  feeling dizzy and  standing up.) Overall Cognitive Status: Within Functional Limits for tasks assessed  Blood pressure 136/70, pulse 64, temperature 98.7 F (37.1 C), temperature source Axillary, resp. rate 16, height 5\' 8"  (1.727 m), weight 83.008 kg (183 lb), SpO2 95.00%. Physical Exam  Vitals reviewed. Constitutional: He appears well-developed.  HENT:  Head: Normocephalic.  Eyes: EOM are normal.  Neck: Neck supple. No thyromegaly present.  Cardiovascular:  Cardiac rate controlled  Respiratory: Effort  normal and breath sounds normal. No respiratory distress.  GI: Soft. Bowel sounds are normal. He exhibits no distension.  Neurological: He is alert.  Follows full commands. Oriented to person place  Skin:  Bilateral knee incisions are dressed    Results for orders placed during the hospital encounter of 02/06/14 (from the past 24 hour(s))  CBC     Status: Abnormal   Collection Time    02/07/14  4:34 AM      Result Value Ref Range   WBC 7.9  4.0 - 10.5 K/uL   RBC 3.65 (*) 4.22 - 5.81 MIL/uL   Hemoglobin 12.6 (*) 13.0 - 17.0 g/dL   HCT 35.9 (*) 39.0 - 52.0 %   MCV 98.4  78.0 - 100.0 fL   MCH 34.5 (*) 26.0 - 34.0 pg   MCHC 35.1  30.0 - 36.0 g/dL   RDW 13.4  11.5 - 15.5 %   Platelets 180  150 - 400 K/uL  BASIC METABOLIC PANEL     Status: Abnormal   Collection Time    02/07/14  4:34 AM      Result Value Ref Range   Sodium 132 (*) 137 - 147 mEq/L   Potassium 3.7  3.7 - 5.3 mEq/L   Chloride 95 (*) 96 - 112 mEq/L   CO2 26  19 - 32 mEq/L   Glucose, Bld 171 (*) 70 - 99 mg/dL   BUN 13  6 - 23 mg/dL   Creatinine, Ser 0.61  0.50 - 1.35 mg/dL   Calcium 8.4  8.4 - 10.5 mg/dL   GFR calc non Af Amer >90  >90 mL/min   GFR calc Af Amer >90  >90 mL/min   Anion gap 11  5 - 15   No results found.  Assessment/Plan: Diagnosis: endstage OA of both knees s/p bilateral TKA's 1. Does the need for close, 24 hr/day medical supervision in concert with the patient's rehab needs make it unreasonable for this patient to be served in a less intensive setting? Yes 2. Co-Morbidities requiring supervision/potential complications: htn, abla, paf 3. Due to bladder management, bowel management, safety, skin/wound care, disease management, medication administration, pain management and patient education, does the patient require 24 hr/day rehab nursing? Yes 4. Does the patient require coordinated care of a physician, rehab nurse, PT (1-2 hrs/day, 5 days/week) and OT (1-2 hrs/day, 5 days/week) to address physical  and functional deficits in the context of the above medical diagnosis(es)? Yes Addressing deficits in the following areas: balance, endurance, locomotion, strength, transferring, bowel/bladder control, bathing, dressing, feeding, grooming, toileting and psychosocial support 5. Can the patient actively participate in an intensive therapy program of at least 3 hrs of therapy per day at least 5 days per week? Yes 6. The potential for patient to make measurable gains while on inpatient rehab is excellent 7. Anticipated functional outcomes upon discharge from inpatient rehab are modified independent  with PT, modified independent with OT, n/a with SLP. 8. Estimated rehab length of stay to reach the above functional goals is: 7 days 9. Does the patient have adequate social supports to accommodate these discharge functional goals? Yes 10. Anticipated D/C setting: Home 11. Anticipated post D/C treatments: [place] therapy 12. Overall Rehab/Functional Prognosis: excellent  RECOMMENDATIONS: This patient's condition is appropriate for continued rehabilitative care in the following setting: CIR Patient has agreed to participate in recommended program. Yes Note that insurance prior authorization may be required for reimbursement for recommended care.  Comment: Rehab Admissions Coordinator to follow up.  Thanks,  Meredith Staggers, MD, Mellody Drown     02/07/2014

## 2014-02-07 NOTE — Progress Notes (Signed)
Patient ID: Travis [name], male   DOB: [birth year], 73 y.o.   MRN: [id number] Subjective: 1 Day Post-Op Procedure(s) (LRB): TOTAL KNEE BILATERAL (Bilateral)    Patient reports pain as mild.  Only feels a bit groggy perhaps from pan meds, otherwise doing well, no events  Objective:   VITALS:   Filed Vitals:   02/07/14 0800  BP:   Pulse:   Temp:   Resp: 16    Neurovascular intact Incision: dressing C/D/I  LABS  Recent Labs  02/07/14 0434  HGB 12.6*  HCT 35.9*  WBC 7.9  PLT 180     Recent Labs  02/07/14 0434  NA 132*  K 3.7  BUN 13  CREATININE 0.61  GLUCOSE 171*     Recent Labs  02/06/14 0739  INR 0.89     Assessment/Plan: 1 Day Post-Op Procedure(s) (LRB): TOTAL KNEE BILATERAL (Bilateral)   Advance diet Up with therapy Plan for discharge tomorrow if does well with therapy Adjust medications to remain comfortable but not to groggy

## 2014-02-07 NOTE — Progress Notes (Signed)
Physical Therapy Treatment Patient Details Name: Travis [name] MRN: [id number] DOB: [birth year] Today's Date: 02/07/2014    History of Present Illness Bilateral TKA's 02/06/14    PT Comments    Continues to report feeling dizzy.  BP 154/ 74. Progressing.   Follow Up Recommendations  CIR     Equipment Recommendations  None recommended by PT    Recommendations for Other Services       Precautions / Restrictions Precautions Precautions: Fall;Knee Precaution Comments: monitor VS, use L KI Required Braces or Orthoses: Knee Immobilizer - Left    Mobility  Bed Mobility Overal bed mobility: Independent Bed Mobility: Supine to Sit     Supine to sit: Min assist Sit to supine: Mod assist   General bed mobility comments: assist for legs onto  bed, cues on technique to "walk" hips backward onto bed prior to assisting legs onto bed.  Transfers Overall transfer level: Needs assistance Equipment used: Rolling walker (2 wheeled) Transfers: Sit to/from [facility] Sit to Stand: Mod assist;+2 safety/equipment;From elevated surface;+2 physical assistance Stand pivot transfers: +2 safety/equipment;Mod assist       General transfer comment: cues for technique, hand and Leg positions when pushing from recliner to stand up, steady assist to balance until hands  are on RW., cues to back up to bed, leading with right leg. cues to sit diown with control  using UE's  Ambulation/Gait Ambulation/Gait assistance: +2 safety/equipment;Min assist Ambulation Distance (Feet): 50 Feet Assistive device: Rolling walker (2 wheeled) Gait Pattern/deviations: Step-to pattern;Decreased step length - left;Decreased step length - right;Antalgic Gait velocity: decreased   General Gait Details: cues for sequence, position inside of RW.   Stairs            Wheelchair Mobility    Modified Rankin (Stroke Patients Only)       Balance           Standing balance support:  Bilateral upper extremity supported;During functional activity Standing balance-Leahy Scale: Poor                      Cognition Arousal/Alertness: Awake/alert Behavior During Therapy: Anxious;Impulsive                        Exercises Total Joint Exercises Knee Flexion: AAROM;Both;10 reps;Seated    General Comments        Pertinent Vitals/Pain Pain Score: 2  Pain Descriptors / Indicators: Discomfort    Home Living                      Prior Function            PT Goals (current goals can now be found in the care plan section) Progress towards PT goals: Progressing toward goals    Frequency  7X/week    PT Plan Current plan remains appropriate    Co-evaluation             End of Session Equipment Utilized During Treatment: Gait belt;Left knee immobilizer Activity Tolerance: Patient tolerated treatment well Patient left: with call bell/phone within reach;in bed     Time: 3710-6269 PT Time Calculation (min): 16 min  Charges:  $Gait Training: 8-22 mins                    G Codes:      [name] 02/07/2014, 6:06 PM

## 2014-02-08 ENCOUNTER — Inpatient Hospital Stay (HOSPITAL_COMMUNITY)
Admission: RE | Admit: 2014-02-08 | Discharge: 2014-02-14 | DRG: 945 | Disposition: A | Discharge status: Home Health | Payer: Medicare Other | Source: Intra-hospital | Attending: Physical Medicine & Rehabilitation | Admitting: Physical Medicine & Rehabilitation

## 2014-02-08 ENCOUNTER — Encounter (HOSPITAL_COMMUNITY): Payer: Self-pay | Admitting: *Deleted

## 2014-02-08 DIAGNOSIS — Z801 Family history of malignant neoplasm of trachea, bronchus and lung: Secondary | ICD-10-CM

## 2014-02-08 DIAGNOSIS — Z823 Family history of stroke: Secondary | ICD-10-CM | POA: Diagnosis not present

## 2014-02-08 DIAGNOSIS — Z7901 Long term (current) use of anticoagulants: Secondary | ICD-10-CM

## 2014-02-08 DIAGNOSIS — G4700 Insomnia, unspecified: Secondary | ICD-10-CM | POA: Diagnosis present

## 2014-02-08 DIAGNOSIS — Z96659 Presence of unspecified artificial knee joint: Secondary | ICD-10-CM | POA: Diagnosis not present

## 2014-02-08 DIAGNOSIS — Z79899 Other long term (current) drug therapy: Secondary | ICD-10-CM | POA: Diagnosis not present

## 2014-02-08 DIAGNOSIS — K219 Gastro-esophageal reflux disease without esophagitis: Secondary | ICD-10-CM | POA: Diagnosis present

## 2014-02-08 DIAGNOSIS — I10 Essential (primary) hypertension: Secondary | ICD-10-CM | POA: Diagnosis present

## 2014-02-08 DIAGNOSIS — Z5189 Encounter for other specified aftercare: Principal | ICD-10-CM

## 2014-02-08 DIAGNOSIS — Z87891 Personal history of nicotine dependence: Secondary | ICD-10-CM | POA: Diagnosis not present

## 2014-02-08 DIAGNOSIS — M1710 Unilateral primary osteoarthritis, unspecified knee: Secondary | ICD-10-CM | POA: Diagnosis present

## 2014-02-08 DIAGNOSIS — E663 Overweight: Secondary | ICD-10-CM | POA: Diagnosis present

## 2014-02-08 DIAGNOSIS — I509 Heart failure, unspecified: Secondary | ICD-10-CM | POA: Diagnosis present

## 2014-02-08 DIAGNOSIS — K5900 Constipation, unspecified: Secondary | ICD-10-CM | POA: Diagnosis present

## 2014-02-08 DIAGNOSIS — Z8711 Personal history of peptic ulcer disease: Secondary | ICD-10-CM

## 2014-02-08 DIAGNOSIS — I5022 Chronic systolic (congestive) heart failure: Secondary | ICD-10-CM | POA: Diagnosis present

## 2014-02-08 DIAGNOSIS — N400 Enlarged prostate without lower urinary tract symptoms: Secondary | ICD-10-CM | POA: Diagnosis present

## 2014-02-08 DIAGNOSIS — I4891 Unspecified atrial fibrillation: Secondary | ICD-10-CM | POA: Diagnosis present

## 2014-02-08 DIAGNOSIS — D500 Iron deficiency anemia secondary to blood loss (chronic): Secondary | ICD-10-CM | POA: Diagnosis not present

## 2014-02-08 DIAGNOSIS — M7989 Other specified soft tissue disorders: Secondary | ICD-10-CM

## 2014-02-08 DIAGNOSIS — D62 Acute posthemorrhagic anemia: Secondary | ICD-10-CM | POA: Diagnosis present

## 2014-02-08 DIAGNOSIS — Z96653 Presence of artificial knee joint, bilateral: Secondary | ICD-10-CM

## 2014-02-08 LAB — CBC
HEMATOCRIT: 31.1 % — AB (ref 39.0–52.0)
Hemoglobin: 10.9 g/dL — ABNORMAL LOW (ref 13.0–17.0)
MCH: 34.6 pg — AB (ref 26.0–34.0)
MCHC: 35 g/dL (ref 30.0–36.0)
MCV: 98.7 fL (ref 78.0–100.0)
PLATELETS: 159 10*3/uL (ref 150–400)
RBC: 3.15 MIL/uL — ABNORMAL LOW (ref 4.22–5.81)
RDW: 13.2 % (ref 11.5–15.5)
WBC: 10.4 10*3/uL (ref 4.0–10.5)

## 2014-02-08 LAB — BASIC METABOLIC PANEL
ANION GAP: 10 (ref 5–15)
BUN: 7 mg/dL (ref 6–23)
CALCIUM: 8.7 mg/dL (ref 8.4–10.5)
CO2: 27 mEq/L (ref 19–32)
CREATININE: 0.58 mg/dL (ref 0.50–1.35)
Chloride: 95 mEq/L — ABNORMAL LOW (ref 96–112)
GFR calc non Af Amer: >90 mL/min (ref >90)
Glucose, Bld: 136 mg/dL — ABNORMAL HIGH (ref 70–99)
Potassium: 4 mEq/L (ref 3.7–5.3)
Sodium: 132 mEq/L — ABNORMAL LOW (ref 137–147)

## 2014-02-08 MED ORDER — SORBITOL 70 % SOLN: 30.0000 mL | Freq: Every day | Status: DC | PRN

## 2014-02-08 MED ORDER — ONDANSETRON HCL 4 MG/2ML IJ SOLN: 4.0000 mg | Freq: Four times a day (QID) | INTRAMUSCULAR | Status: DC | PRN

## 2014-02-08 MED ORDER — DABIGATRAN ETEXILATE MESYLATE 150 MG PO CAPS
150.0000 mg | ORAL_CAPSULE | Freq: Two times a day (BID) | ORAL | Status: DC
  Administered 2014-02-08 – 2014-02-14 (×12): 150 mg via ORAL
  Filled 2014-02-08 (×14): qty 1

## 2014-02-08 MED ORDER — OXYCODONE HCL 5 MG PO TABS: 5.0000 mg | ORAL_TABLET | ORAL | Status: DC | PRN

## 2014-02-08 MED ORDER — FINASTERIDE 1 MG PO TABS: 1.0000 mg | ORAL_TABLET | Freq: Every day | ORAL | Status: DC

## 2014-02-08 MED ORDER — TEMAZEPAM 15 MG PO CAPS
15.0000 mg | ORAL_CAPSULE | Freq: Every day | ORAL | Status: DC
  Administered 2014-02-08 – 2014-02-13 (×6): 15 mg via ORAL
  Filled 2014-02-08 (×6): qty 1

## 2014-02-08 MED ORDER — DOCUSATE SODIUM 100 MG PO CAPS
100.0000 mg | ORAL_CAPSULE | Freq: Two times a day (BID) | ORAL | Status: DC
  Administered 2014-02-08 – 2014-02-13 (×7): 100 mg via ORAL
  Filled 2014-02-08 (×14): qty 1

## 2014-02-08 MED ORDER — FERROUS SULFATE 325 (65 FE) MG PO TABS
325.0000 mg | ORAL_TABLET | Freq: Three times a day (TID) | ORAL | Status: DC
  Administered 2014-02-08 – 2014-02-14 (×18): 325 mg via ORAL
  Filled 2014-02-08 (×21): qty 1

## 2014-02-08 MED ORDER — METHOCARBAMOL 500 MG PO TABS: 500.0000 mg | ORAL_TABLET | Freq: Four times a day (QID) | ORAL | Status: DC | PRN

## 2014-02-08 MED ORDER — DSS 100 MG PO CAPS: 100.0000 mg | ORAL_CAPSULE | Freq: Two times a day (BID) | ORAL | Status: DC

## 2014-02-08 MED ORDER — METHOCARBAMOL 500 MG PO TABS
500.0000 mg | ORAL_TABLET | Freq: Four times a day (QID) | ORAL | Status: DC | PRN
  Administered 2014-02-08 – 2014-02-13 (×4): 500 mg via ORAL
  Filled 2014-02-08 (×4): qty 1

## 2014-02-08 MED ORDER — POLYETHYLENE GLYCOL 3350 17 G PO PACK
17.0000 g | PACK | Freq: Two times a day (BID) | ORAL | Status: DC
  Administered 2014-02-08 – 2014-02-13 (×6): 17 g via ORAL
  Filled 2014-02-08 (×14): qty 1

## 2014-02-08 MED ORDER — OXYCODONE HCL 5 MG PO TABS
5.0000 mg | ORAL_TABLET | ORAL | Status: DC | PRN
  Administered 2014-02-08 – 2014-02-11 (×14): 15 mg via ORAL
  Filled 2014-02-08 (×15): qty 3

## 2014-02-08 MED ORDER — DOFETILIDE 500 MCG PO CAPS
500.0000 ug | ORAL_CAPSULE | Freq: Two times a day (BID) | ORAL | Status: DC
  Administered 2014-02-08 – 2014-02-14 (×12): 500 ug via ORAL
  Filled 2014-02-08 (×14): qty 1

## 2014-02-08 MED ORDER — POLYETHYLENE GLYCOL 3350 17 G PO PACK: 17.0000 g | PACK | Freq: Two times a day (BID) | ORAL | Status: DC

## 2014-02-08 MED ORDER — FERROUS SULFATE 325 (65 FE) MG PO TABS: 325.0000 mg | ORAL_TABLET | Freq: Three times a day (TID) | ORAL | Status: DC

## 2014-02-08 MED ORDER — PANTOPRAZOLE SODIUM 40 MG PO TBEC
40.0000 mg | DELAYED_RELEASE_TABLET | Freq: Every day | ORAL | Status: DC
  Administered 2014-02-09 – 2014-02-14 (×6): 40 mg via ORAL
  Filled 2014-02-08 (×6): qty 1

## 2014-02-08 MED ORDER — AMLODIPINE BESYLATE 5 MG PO TABS
5.0000 mg | ORAL_TABLET | Freq: Every morning | ORAL | Status: DC
Start: 2014-02-09 — End: 2014-02-14
  Administered 2014-02-09 – 2014-02-13 (×5): 5 mg via ORAL
  Filled 2014-02-08 (×6): qty 1

## 2014-02-08 MED ORDER — ONDANSETRON HCL 4 MG PO TABS: 4.0000 mg | ORAL_TABLET | Freq: Four times a day (QID) | ORAL | Status: DC | PRN

## 2014-02-08 MED ORDER — CARVEDILOL 3.125 MG PO TABS
3.1250 mg | ORAL_TABLET | Freq: Two times a day (BID) | ORAL | Status: DC
  Administered 2014-02-08 – 2014-02-14 (×12): 3.125 mg via ORAL
  Filled 2014-02-08 (×14): qty 1

## 2014-02-08 MED ORDER — ACETAMINOPHEN 325 MG PO TABS: 325.0000 mg | ORAL_TABLET | ORAL | Status: DC | PRN

## 2014-02-08 MED ORDER — IRBESARTAN 300 MG PO TABS
300.0000 mg | ORAL_TABLET | Freq: Every day | ORAL | Status: DC
  Administered 2014-02-08 – 2014-02-13 (×6): 300 mg via ORAL
  Filled 2014-02-08 (×7): qty 1

## 2014-02-08 NOTE — Progress Notes (Signed)
PMR Admission Coordinator Pre-Admission Assessment  Patient: Travis [name] is an 73 y.o., male  MRN: [id number]  DOB: [birth year]  Height: 5\' 8"  (172.7 cm)  Weight: 83.008 kg (183 lb)  Insurance Information  HMO: PPO: PCP: IPA: 80/20: OTHER:  PRIMARY: Medicare A/B Policy#: [id number] a Subscriber: Sheliah Plane  CM Name: Phone#: Fax#:  Pre-Cert#: Employer: Retired  Benefits: Phone #: Name: Checked in [place]. Date: 04/25/05 Deduct: $1260 Out of Pocket Max: none Life Max: unlimited  CIR: 100% SNF: 100 days  Outpatient: 80% Co-Pay: 20%  Home Health: 100% Co-Pay: none  DME: 80% Co-Pay: 20%  Providers: patient's choice   SECONDARY: BCBS of [place] sup Policy#: [place]  CM Name: Phone#: Fax#:  Pre-Cert#: Employer: Retired  Benefits: Phone #: [phone] Name:  Eff. Date: Deduct: Out of Pocket Max: Life Max:  CIR: SNF:  Outpatient: Co-Pay:  Home Health: Co-Pay:  DME: Co-Pay:  Emergency Contact Information  Contact Information    Name  Relation  Home  Work  Mobile    [place],Tommy  Brother  [phone]   [phone]    Margy Clarks  [phone]   [phone]      Current Medical History  Patient Admitting Diagnosis: B TKR  History of Present Illness: A 73 y.o. right-handed male with history of PAF maintained on chronic Coumadin therapy, bicuspid aortic valve. Admitted 02/06/2014 with end stage osteoarthritis bilateral knees and no relief with conservative care. Patient independent prior to admission with assistance from his brother. Onset of symptoms have been gradual over the last 3 years. Underwent bilateral total knee replacement 02/06/2014 per Dr. Alvan Dame. Postoperative pain management with pain medication adjusted secondary to some early lethargy. Patient is weightbearing as tolerated. Acute blood loss anemia 12.6 from admission hemoglobin 15.6. Physical therapy evaluation completed 02/06/2014. M.D. as requested physical medicine rehabilitation  consult.  Past Medical History  Past Medical History   Diagnosis  Date   .  Arthritis    .  Hypertension    .  GERD (gastroesophageal reflux disease)    .  Bicuspid aortic valve    .  Status post clamping of cerebral aneurysm  80   .  Sinus bradycardia    .  Paroxysmal atrial fibrillation    .  Cardiomyopathy -resolved      tachycardia-induced, EF 55% June 2009   .  History of cardioversion  12/16/2012   .  Left bundle branch block    .  CHF (congestive heart failure)      JULY 2014   .  History of stomach ulcers     Family History  family history includes Lung cancer (age of onset: 65) in his mother; Stroke in his father and mother. There is no history of Colon cancer.  Prior Rehab/Hospitalizations: Had outpatient therapy for shoulder in the Spring at [facility].  Current Medications  Current facility-administered medications:alum & mag hydroxide-simeth (MAALOX/MYLANTA) 200-200-20 MG/5ML suspension 30 mL, 30 mL, Oral, Q4H PRN, Lucille Passy Babish, PA-C; amLODipine (NORVASC) tablet 5 mg, 5 mg, Oral, q morning - 10a, Lucille Passy Babish, PA-C, 5 mg at 02/08/14 2585; bisacodyl (DULCOLAX) suppository 10 mg, 10 mg, Rectal, Daily PRN, Lucille Passy Babish, PA-C  carvedilol (COREG) tablet 3.125 mg, 3.125 mg, Oral, BID WC, Lucille Passy Babish, PA-C, 3.125 mg at 02/08/14 0830; celecoxib (CELEBREX) capsule 200 mg, 200 mg, Oral, Q12H, Lucille Passy Babish, PA-C, 200 mg at 02/08/14 0941; dabigatran (PRADAXA) capsule 150 mg, 150 mg, Oral, Q12H, Lucille Passy  Babish, PA-C, 150 mg at 02/08/14 0949; diphenhydrAMINE (BENADRYL) capsule 25 mg, 25 mg, Oral, Q4H PRN, Nickie Retort, MD, 25 mg at 02/06/14 1740  diphenhydrAMINE (BENADRYL) capsule 25 mg, 25 mg, Oral, Q6H PRN, Lucille Passy Babish, PA-C; diphenhydrAMINE (BENADRYL) injection 12.5 mg, 12.5 mg, Intravenous, Q4H PRN, Nickie Retort, MD; diphenhydrAMINE (BENADRYL) injection 25 mg, 25 mg, Intramuscular, Q4H PRN, Nickie Retort, MD;  docusate sodium (COLACE) capsule 100 mg, 100 mg, Oral, BID, Lucille Passy Babish, PA-C, 100 mg at 02/08/14 4259  dofetilide (TIKOSYN) capsule 500 mcg, 500 mcg, Oral, BID, Lucille Passy Babish, PA-C, 500 mcg at 02/08/14 5638; ferrous sulfate tablet 325 mg, 325 mg, Oral, TID PC, Lucille Passy [place], PA-C, 325 mg at 02/07/14 1926; finasteride (PROPECIA) tablet 1 mg, 1 mg, Oral, Daily, Lucille Passy Babish, PA-C; HYDROmorphone (DILAUDID) injection 0.5-2 mg, 0.5-2 mg, Intravenous, Q2H PRN, Lucille Passy Babish, PA-C, 1 mg at 02/07/14 0431  irbesartan (AVAPRO) tablet 300 mg, 300 mg, Oral, QHS, Mauri Pole, MD, 300 mg at 02/07/14 2135; lactated ringers infusion, , Intravenous, Continuous, Bailey Mech, CRNA; menthol-cetylpyridinium (CEPACOL) lozenge 3 mg, 1 lozenge, Oral, PRN, Lucille Passy Babish, PA-C, 3 mg at 02/07/14 1734; methocarbamol (ROBAXIN) 500 mg in dextrose 5 % 50 mL IVPB, 500 mg, Intravenous, Q6H PRN, Lucille Passy Babish, PA-C  methocarbamol (ROBAXIN) tablet 500 mg, 500 mg, Oral, Q6H PRN, Lucille Passy Babish, PA-C, 500 mg at 02/08/14 7564; metoCLOPramide (REGLAN) injection 10 mg, 10 mg, Intravenous, Q8H PRN, Nickie Retort, MD; metoCLOPramide (REGLAN) injection 5-10 mg, 5-10 mg, Intravenous, Q8H PRN, Lucille Passy Babish, PA-C; metoCLOPramide (REGLAN) tablet 5-10 mg, 5-10 mg, Oral, Q8H PRN, Lucille Passy Babish, PA-C  nalbuphine (NUBAIN) 20 MG/ML injection 5-10 mg, 5-10 mg, Subcutaneous, Q4H PRN, Mauri Pole, MD; nalbuphine (NUBAIN) 20 MG/ML injection 5-10 mg, 5-10 mg, Intravenous, Q4H PRN, Mauri Pole, MD; naloxone [facility]) 2 mg in dextrose 5 % 250 mL infusion, 1-4 mcg/kg/hr, Intravenous, Continuous PRN, Nickie Retort, MD; naloxone [facility]) injection 0.4 mg, 0.4 mg, Intravenous, PRN, Nickie Retort, MD  ondansetron [facility]) injection 4 mg, 4 mg, Intravenous, Q8H PRN, Nickie Retort, MD; ondansetron [facility]) injection 4 mg, 4 mg, Intravenous, Q6H PRN, Lucille Passy Babish, PA-C, 4  mg at 02/06/14 1739; ondansetron (ZOFRAN) tablet 4 mg, 4 mg, Oral, Q6H PRN, Lucille Passy Babish, PA-C; oxyCODONE (Oxy IR/ROXICODONE) immediate release tablet 5-15 mg, 5-15 mg, Oral, Q4H, Lucille Passy Babish, PA-C, 15 mg at 02/08/14 0949  pantoprazole (PROTONIX) EC tablet 40 mg, 40 mg, Oral, Daily, Lucille Passy Babish, PA-C, 40 mg at 02/08/14 0949; phenol (CHLORASEPTIC) mouth spray 1 spray, 1 spray, Mouth/Throat, PRN, Lucille Passy Babish, PA-C; polyethylene glycol (MIRALAX / GLYCOLAX) packet 17 g, 17 g, Oral, BID, Lucille Passy Babish, PA-C, 17 g at 02/08/14 0950  sodium chloride 0.9 % 1,000 mL with potassium chloride 10 mEq infusion, , Intravenous, Continuous, Lucille Passy Babish, PA-C, Last Rate: 10 mL/hr at 02/08/14 0126; sodium chloride 0.9 % injection 3 mL, 3 mL, Intravenous, PRN, Nickie Retort, MD; temazepam (RESTORIL) capsule 15 mg, 15 mg, Oral, QHS, Lucille Passy Babish, PA-C, 15 mg at 02/07/14 2135  Patients Current Diet: General  Precautions / Restrictions  Precautions  Precautions: Fall;Knee  Precaution Comments: monitor VS, use L KI  Restrictions  Weight Bearing Restrictions: No  Prior Activity Level  Community (5-7x/wk): Went out daily.  Home Assistive Devices / Equipment  Home Assistive Devices/Equipment: Eyeglasses;Walker (specify type);Bedside commode/3-in-1  Home Equipment: Walker - 2 wheels;Bedside commode;Tub  bench;Cane - single point  Prior Functional Level  Prior Function  Level of Independence: Independent  Current Functional Level  Cognition  Overall Cognitive Status: Within Functional Limits for tasks assessed  Orientation Level: Oriented X4   Extremity Assessment  (includes Sensation/Coordination)  Upper Extremity Assessment: Overall WFL for tasks assessed  Lower Extremity Assessment: RLE deficits/detail;LLE deficits/detail  RLE Deficits / Details: able to lift from bed, flexed knee to 30  LLE Deficits / Details: same as R   ADLs  Overall ADL's : Needs  assistance/impaired  Grooming: Oral care;Minimal assistance;Standing  Upper Body Bathing: Set up;Sitting  Lower Body Bathing: +2 for safety/equipment;Moderate assistance;Sit to/from stand  Upper Body Dressing : Set up;Sitting  Lower Body Dressing: +2 for safety/equipment;Maximal assistance;Sit to/from stand  Toilet Transfer: +2 for safety/equipment;Minimal assistance;Stand-pivot (to recliner)  Toileting- Clothing Manipulation and Hygiene: +2 for safety/equipment;Moderate assistance;Sit to/from stand  General ADL Comments: Stood at sink for oral care. +2 for safety for transition from sit to stand. Needs cues to move slowly. Pt unable to release a hand in standing for adls. Educated on AE but did not use this session. Demonstrated sock aide. Pt does have a long sponge at home. Can hook pants over RLE and lift--unable to lift LLE at this time   Mobility  Overal bed mobility: Independent  Bed Mobility: Supine to Sit  Supine to sit: Min assist  Sit to supine: Mod assist  General bed mobility comments: assist for legs   Transfers  Overall transfer level: Needs assistance  Equipment used: Rolling walker (2 wheeled)  Transfers: Sit to/from Stand;Stand Pivot Transfers  Sit to Stand: +2 safety/equipment;Min assist;From elevated surface  Stand pivot transfers: +2 safety/equipment;Min assist (to recliner from standing at sink)  General transfer comment: cues for technique; UE/LE placement   Ambulation / Gait / Stairs / Wheelchair Mobility  Ambulation/Gait  Ambulation/Gait assistance: +2 safety/equipment;Min assist  Ambulation Distance (Feet): 50 Feet  Assistive device: Rolling walker (2 wheeled)  Gait Pattern/deviations: Step-to pattern;Decreased step length - left;Decreased step length - right;Antalgic  Gait velocity: decreased  General Gait Details: cues for sequence, position inside of RW.   Posture / Balance  Dynamic Sitting Balance  Sitting balance - Comments: does not feel confident to lean  forward yet, increased dizziness.   Special needs/care consideration  BiPAP/CPAP No  CPM No  Continuous Drip IV No  Dialysis No  Life Vest No  Oxygen No  Special Bed No  Trach Size No  Wound Vac (area) No  Skin Has new B TKR incisions  Bowel mgmt: No BM since admission per patient  Bladder mgmt: Voiding in urinal  Diabetic mgmt No   Previous Home Environment  Living Arrangements: Other relatives  Available Help at Discharge: Family  Type of Home: House  Home Layout: One level  Home Access: Stairs to enter  Entrance Stairs-Rails: None  Entrance Stairs-Number of Steps: 2  Bathroom Shower/Tub: Gaffer (accessible, no ledge)  Biochemist, clinical: Standard  Home Care Services: No  Discharge Living Setting  Plans for Discharge Living Setting: Patient's home;Alone (Lives alone)  Type of Home at Discharge: House  Discharge Home Layout: One level  Discharge Home Access: Stairs to enter  Entrance Stairs-Number of Steps: 2 steps at garage entrance.  Does the patient have any problems obtaining your medications?: No  Social/Family/Support Systems  Patient Roles: Other (Comment) (Has a brother who lives in [place].)  Contact Information: Shon Indelicato - brother  Anticipated Caregiver: brother  Anticipated Caregiver's Contact Information:  Tommy (h) [phone] (c) [phone]  Ability/Limitations of Caregiver: Brother lives in [place] and can come stay with patient day of or day before discharge home. Brother can help with meals but is not a good "caregiver" per patient.  Caregiver Availability: 24/7  Discharge Plan Discussed with Primary Caregiver: Yes  Is Caregiver In Agreement with Plan?: Yes  Does Caregiver/Family have Issues with Lodging/Transportation while Pt is in Rehab?: No  Goals/Additional Needs  Patient/Family Goal for Rehab: PT/OT mod I goals  Expected length of stay: 7 days  Cultural Considerations: None  Dietary Needs: Regular diet, thin liquids  Equipment  Needs: TBD  Pt/Family Agrees to Admission and willing to participate: Yes  Program Orientation Provided & Reviewed with Pt/Caregiver Including Roles & Responsibilities: Yes  Decrease burden of Care through IP rehab admission: N/A  Possible need for SNF placement upon discharge: Not anticipated  Patient Condition: This patient's condition remains as documented in the consult dated 02/08/14, in which the Rehabilitation Physician determined and documented that the patient's condition is appropriate for intensive rehabilitative care in an inpatient rehabilitation facility. Will admit to inpatient rehab today.  Preadmission Screen Completed By: Retta Diones, 02/08/2014 10:23 AM  ______________________________________________________________________  Discussed status with Dr. Naaman Plummer on 02/08/14 at 1023 and received telephone approval for admission today.  Admission Coordinator: Retta Diones, time1023/Date09/16/15  Cosigned by: Meredith Staggers, MD [02/08/2014 10:28 AM]

## 2014-02-08 NOTE — Progress Notes (Addendum)
     Subjective: 2 Days Post-Op Procedure(s) (LRB): TOTAL KNEE BILATERAL (Bilateral)   Patient reports pain as mild, pain controlled. No events throughout the night. Ready to be discharged to inpatient rehab.  Objective:   VITALS:   Filed Vitals:   02/08/14  BP: 136/73  Pulse: 74  Temp: 97.7 F (36.5 C)   Resp: 16    Dorsiflexion/Plantar flexion intact Incision: dressing C/D/I No cellulitis present Compartment soft  LABS  Recent Labs  02/07/14 0434 02/08/14 0453  HGB 12.6* 10.9*  HCT 35.9* 31.1*  WBC 7.9 10.4  PLT 180 159     Recent Labs  02/07/14 0434 02/08/14 0453  NA 132* 132*  K 3.7 4.0  BUN 13 7  CREATININE 0.61 0.58  GLUCOSE 171* 136*     Assessment/Plan: 2 Days Post-Op Procedure(s) (LRB): TOTAL KNEE BILATERAL (Bilateral) Up with therapy Discharge to CIR Follow up in 2 weeks at [facility]. Follow up with OLIN,Joniel Graumann D in 2 weeks.  Contact information:  [facility] [address], [place] [phone]    Expected ABLA  Treated with iron and will observe  Overweight (BMI 25-29.9) Estimated body mass index is 27.83 kg/(m^2) as calculated from the following:   Height as of this encounter: 5\' 8"  (1.727 m).   Weight as of this encounter: 83.008 kg (183 lb). Patient also counseled that weight may inhibit the healing process Patient counseled that losing weight will help with future health issues       [place] Pugh. Donna Snooks   PAC  02/08/2014, 9:18 AM

## 2014-02-08 NOTE — Progress Notes (Signed)
Physical Therapy Treatment Patient Details Name: Travis [name] MRN: [id number] DOB: [birth year] Today's Date: 02/08/2014    History of Present Illness Bilateral TKA's 02/06/14    PT Comments    Patient improving in ROM, mobility. Still prefers to use L ki. Plans for CIR today .  Follow Up Recommendations  CIR     Equipment Recommendations       Recommendations for Other Services       Precautions / Restrictions Precautions Precautions: Fall;Knee Required Braces or Orthoses: Knee Immobilizer - Left Restrictions Weight Bearing Restrictions: No    Mobility  Bed Mobility         Supine to sit: Min assist Sit to supine: Min assist   General bed mobility comments: assist for legs  Transfers Overall transfer level: Needs assistance Equipment used: Rolling walker (2 wheeled) Transfers: Sit to/from Stand Sit to Stand: Mod assist;From elevated surface Stand pivot transfers: +2 safety/equipment;Min assist (to recliner from standing at sink)       General transfer comment: cues for technique; UE/LE placement, how to walk legs backward before reaching for RW and to walk legs out before siting down  Ambulation/Gait Ambulation/Gait assistance: Min assist Ambulation Distance (Feet): 100 Feet Assistive device: Rolling walker (2 wheeled) Gait Pattern/deviations: Step-to pattern;Decreased step length - right;Decreased step length - left Gait velocity: decreased   General Gait Details: cues for sequence, position inside of RW.   Stairs            Wheelchair Mobility    Modified Rankin (Stroke Patients Only)       Balance           Standing balance support: Bilateral upper extremity supported Standing balance-Leahy Scale: Poor                      Cognition Arousal/Alertness: Awake/alert Behavior During Therapy: Impulsive Overall Cognitive Status: Within Functional Limits for tasks assessed                      Exercises Total  Joint Exercises Ankle Circles/Pumps: AROM;Both;10 reps;Supine;PROM Quad Sets: AROM;Both;Supine;20 reps Short Arc QuadSinclair Ship;Both;10 reps;Supine Heel Slides: AAROM;Both;10 reps;Supine Hip ABduction/ADduction: AROM;Both;20 reps Straight Leg Raises: AAROM;Both;20 reps;Supine Knee Flexion: AAROM;Both;15 reps;Seated Goniometric ROM: L=15-60,R=10-70 seated    General Comments        Pertinent Vitals/Pain Pain Score: 4  Pain Location: bil knees Pain Descriptors / Indicators: Dull;Tightness Pain Intervention(s): Premedicated before session;Ice applied    Home Living Family/patient expects to be discharged to:: Private residence Living Arrangements: Other relatives Available Help at Discharge: Family         Home Equipment: Gilford Rile - 2 wheels;Bedside commode;Tub bench;Cane - single point      Prior Function Level of Independence: Independent          PT Goals (current goals can now be found in the care plan section) Acute Rehab PT Goals Patient Stated Goal: get back to walking Progress towards PT goals: Progressing toward goals    Frequency       PT Plan Current plan remains appropriate    Co-evaluation             End of Session Equipment Utilized During Treatment: Gait belt;Left knee immobilizer Activity Tolerance: Patient tolerated treatment well Patient left: in bed;with call bell/phone within reach     Time: 1110-1155 PT Time Calculation (min): 45 min  Charges:  $Gait Training: 8-22 mins $Therapeutic Exercise: 23-37 mins  G Codes:      [name] 02/08/2014, 12:17 PM

## 2014-02-08 NOTE — Progress Notes (Signed)
Received patient from [facility] via ambulance.  Patient alert and oriented x4; denies pain.  Patient oriented to room and unit.  Will continue to monitor.

## 2014-02-08 NOTE — H&P (Signed)
Physical Medicine and Rehabilitation Admission H&P  No chief complaint on file.  :  Chief complaint: Knee pain  HPI: Travis [name] is a 73 y.o. right-handed male with history of PAF maintained on chronic Coumadin therapy, bicuspid aortic valve, systolic congestive heart failure. Admitted 02/06/2014 with end stage osteoarthritis bilateral knees and no relief with conservative care. Patient independent prior to admission with assistance from his brother. Onset of symptoms have been gradual over the last 3 years. Underwent bilateral total knee replacement 02/06/2014 per Dr. Alvan Dame. Postoperative pain management with pain medication adjusted secondary to some early lethargy. Patient is weightbearing as tolerated. Patient's Coumadin was transitioned to Pradaxa per cardiology services during his hospital stay for chronic atrial fibrillation. Cardiac rate remained controlled no chest pain or shortness of breath. Acute blood loss anemia 10.9 from admission hemoglobin 15.6. Physical therapy and occupational evaluations completed 02/06/2014. M.D. as requested physical medicine rehabilitation consult. Patient was admitted for comprehensive rehabilitation program    ROS Review of Systems  Cardiovascular: Positive for palpitations and leg swelling.  Gastrointestinal: Positive for constipation.  GERD  Musculoskeletal: Positive for joint pain and myalgias.  Psychiatric/Behavioral: The patient has insomnia.  All other systems reviewed and are negative  Past Medical History   Diagnosis  Date   .  Arthritis    .  Hypertension    .  GERD (gastroesophageal reflux disease)    .  Bicuspid aortic valve    .  Status post clamping of cerebral aneurysm  80   .  Sinus bradycardia    .  Paroxysmal atrial fibrillation    .  Cardiomyopathy -resolved      tachycardia-induced, EF 55% June 2009   .  History of cardioversion  12/16/2012   .  Left bundle branch block    .  CHF (congestive heart failure)      JULY 2014   .   History of stomach ulcers     Past Surgical History   Procedure  Laterality  Date   .  Cerebral anuersym post clips     .  Rotator cuff repair       lf   .  Fractured left arm     .  Left tendon repair       lft foot   .  Hernia repair       lft   .  Wrist ganglion excision       lft   .  Tonsillectomy     .  Inguinal hernia repair   07/02/2011     Procedure: LAPAROSCOPIC INGUINAL HERNIA; Surgeon: Harl Bowie, MD; Location: [place]; Service: General; Laterality: Left; Laparoscopic left inguinal hernia repair and mesh   .  Cardiac catheterization     .  Cardioversion  N/A  12/16/2012     Procedure: CARDIOVERSION; Surgeon: Lelon Perla, MD; Location: [facility] ENDOSCOPY; Service: Cardiovascular; Laterality: N/A;   .  Total knee arthroplasty  Bilateral  02/06/2014     Procedure: TOTAL KNEE BILATERAL; Surgeon: Mauri Pole, MD; Location: [facility]; Service: Orthopedics; Laterality: Bilateral;    Family History   Problem  Relation  Age of Onset   .  Stroke  Father    .  Lung cancer  Mother  2   .  Stroke  Mother    .  Colon cancer  Neg Hx     Social History: reports that he quit smoking about 27 years ago. He has never used smokeless  tobacco. He reports that he drinks alcohol. He reports that he does not use illicit drugs.  Allergies: No Known Allergies  Medications Prior to Admission   Medication  Sig  Dispense  Refill   .  acetaminophen (TYLENOL) 500 MG tablet  Take 1,000 mg by mouth every 6 (six) hours as needed for mild pain or moderate pain.     Marland Kitchen  amLODipine (NORVASC) 5 MG tablet  Take 5 mg by mouth every morning.     Marland Kitchen  b complex vitamins tablet  Take 1 tablet by mouth daily.     .  carvedilol (COREG) 3.125 MG tablet  Take 3.125 mg by mouth 2 (two) times daily with a meal.     .  dofetilide (TIKOSYN) 500 MCG capsule  Take 500 mcg by mouth 2 (two) times daily.     .  finasteride (PROPECIA) 1 MG tablet  Take 1 mg by mouth daily.     .  fish oil-omega-3 fatty acids 1000 MG  capsule  Take 1 g by mouth daily.     .  Flaxseed, Linseed, (RA FLAX SEED OIL 1000 PO)  Take 1,000 mg by mouth 3 (three) times daily.     .  Glucosamine Sulfate (GLUCOSAMINE RELIEF) 1000 MG TABS  Take 1 tablet by mouth 2 (two) times daily.     .  meloxicam (MOBIC) 15 MG tablet  Take 15 mg by mouth daily.     Marland Kitchen  omeprazole (PRILOSEC) 20 MG capsule  Take 20 mg by mouth daily.     Marland Kitchen  telmisartan (MICARDIS) 80 MG tablet  Take 80 mg by mouth at bedtime.     .  temazepam (RESTORIL) 15 MG capsule  Take 15 mg by mouth at bedtime.     Marland Kitchen  warfarin (COUMADIN) 1 MG tablet  Take 1 mg by mouth daily.     .  dabigatran (PRADAXA) 150 MG CAPS capsule  Take 1 capsule (150 mg total) by mouth 2 (two) times daily.  60 capsule  5    Home:  Home Living  Family/patient expects to be discharged to:: Private residence  Living Arrangements: Other relatives  Available Help at Discharge: Family  Type of Home: House  Home Access: Stairs to enter  Technical brewer of Steps: 2  Entrance Stairs-Rails: None  Home Layout: One level  Home Equipment: Environmental consultant - 2 wheels;Bedside commode;Tub bench;Cane - single point  Functional History:  Prior Function  Level of Independence: Independent  Functional Status:  Mobility:  Bed Mobility  Overal bed mobility: Independent  Bed Mobility: Supine to Sit  Supine to sit: Min assist  Sit to supine: Mod assist  General bed mobility comments: assist for legs  Transfers  Overall transfer level: Needs assistance  Equipment used: Rolling walker (2 wheeled)  Transfers: Sit to/from Stand;Stand Pivot Transfers  Sit to Stand: +2 safety/equipment;Min assist;From elevated surface  Stand pivot transfers: +2 safety/equipment;Min assist (to recliner from standing at sink)  General transfer comment: cues for technique; UE/LE placement  Ambulation/Gait  Ambulation/Gait assistance: +2 safety/equipment;Min assist  Ambulation Distance (Feet): 50 Feet  Assistive device: Rolling walker (2  wheeled)  Gait Pattern/deviations: Step-to pattern;Decreased step length - left;Decreased step length - right;Antalgic  Gait velocity: decreased  General Gait Details: cues for sequence, position inside of RW.   ADL:  ADL  Overall ADL's : Needs assistance/impaired  Grooming: Oral care;Minimal assistance;Standing  Upper Body Bathing: Set up;Sitting  Lower Body Bathing: +2 for safety/equipment;Moderate assistance;Sit to/from  stand  Upper Body Dressing : Set up;Sitting  Lower Body Dressing: +2 for safety/equipment;Maximal assistance;Sit to/from stand  Toilet Transfer: +2 for safety/equipment;Minimal assistance;Stand-pivot (to recliner)  Toileting- Clothing Manipulation and Hygiene: +2 for safety/equipment;Moderate assistance;Sit to/from stand  General ADL Comments: Stood at sink for oral care. +2 for safety for transition from sit to stand. Needs cues to move slowly. Pt unable to release a hand in standing for adls. Educated on AE but did not use this session. Demonstrated sock aide. Pt does have a long sponge at home. Can hook pants over RLE and lift--unable to lift LLE at this time  Cognition:  Cognition  Overall Cognitive Status: Within Functional Limits for tasks assessed  Orientation Level: Oriented X4  Cognition  Arousal/Alertness: Awake/alert  Behavior During Therapy: Impulsive  Overall Cognitive Status: Within Functional Limits for tasks assessed    Physical Exam:  Blood pressure 136/73, pulse 74, temperature 97.7 F (36.5 C), temperature source Oral, resp. rate 16, height 5' 8"  (1.727 m), weight 83.008 kg (183 lb), SpO2 96.00%.   Constitutional: He appears well-developed.  HENT: oral mucosa pink and moist Head: Normocephalic.  Eyes: EOM are normal.  Neck: Neck supple. No thyromegaly present.  Cardiovascular:  Cardiac rate controlled  Respiratory: Effort normal and breath sounds normal. No respiratory distress.  GI: Soft. Bowel sounds are normal. He exhibits no distension.    Neurological: He is alert.  Follows full commands. Oriented to person place. Strength intact. Sensory intact. CN exam normal.  Skin:  Bilateral knee incisions are dressed and appropriately tender  Musc: pt able to right left leg off bed 4inches. Unable to do straight leg raise on left.  Psych: pleasant and cooperative   Results for orders placed during the hospital encounter of 02/06/14 (from the past 48 hour(s))   CBC Status: Abnormal    Collection Time    02/07/14 4:34 AM   Result  Value  Ref Range    WBC  7.9  4.0 - 10.5 K/uL    RBC  3.65 (*)  4.22 - 5.81 MIL/uL    Hemoglobin  12.6 (*)  13.0 - 17.0 g/dL    HCT  35.9 (*)  39.0 - 52.0 %    MCV  98.4  78.0 - 100.0 fL    MCH  34.5 (*)  26.0 - 34.0 pg    MCHC  35.1  30.0 - 36.0 g/dL    RDW  13.4  11.5 - 15.5 %    Platelets  180  150 - 400 K/uL   BASIC METABOLIC PANEL Status: Abnormal    Collection Time    02/07/14 4:34 AM   Result  Value  Ref Range    Sodium  132 (*)  137 - 147 mEq/L    Potassium  3.7  3.7 - 5.3 mEq/L    Chloride  95 (*)  96 - 112 mEq/L    CO2  26  19 - 32 mEq/L    Glucose, Bld  171 (*)  70 - 99 mg/dL    BUN  13  6 - 23 mg/dL    Creatinine, Ser  0.61  0.50 - 1.35 mg/dL    Calcium  8.4  8.4 - 10.5 mg/dL    GFR calc non Af Amer  >90  >90 mL/min    GFR calc Af Amer  >90  >90 mL/min    Comment:  (NOTE)     The eGFR has been calculated using the CKD EPI equation.  This calculation has not been validated in all clinical situations.     eGFR's persistently <90 mL/min signify possible Chronic Kidney     Disease.    Anion gap  11  5 - 15   CBC Status: Abnormal    Collection Time    02/08/14 4:53 AM   Result  Value  Ref Range    WBC  10.4  4.0 - 10.5 K/uL    RBC  3.15 (*)  4.22 - 5.81 MIL/uL    Hemoglobin  10.9 (*)  13.0 - 17.0 g/dL    HCT  31.1 (*)  39.0 - 52.0 %    MCV  98.7  78.0 - 100.0 fL    MCH  34.6 (*)  26.0 - 34.0 pg    MCHC  35.0  30.0 - 36.0 g/dL    RDW  13.2  11.5 - 15.5 %    Platelets  159   150 - 400 K/uL   BASIC METABOLIC PANEL Status: Abnormal    Collection Time    02/08/14 4:53 AM   Result  Value  Ref Range    Sodium  132 (*)  137 - 147 mEq/L    Potassium  4.0  3.7 - 5.3 mEq/L    Chloride  95 (*)  96 - 112 mEq/L    CO2  27  19 - 32 mEq/L    Glucose, Bld  136 (*)  70 - 99 mg/dL    BUN  7  6 - 23 mg/dL    Creatinine, Ser  0.58  0.50 - 1.35 mg/dL    Calcium  8.7  8.4 - 10.5 mg/dL    GFR calc non Af Amer  >90  >90 mL/min    GFR calc Af Amer  >90  >90 mL/min    Comment:  (NOTE)     The eGFR has been calculated using the CKD EPI equation.     This calculation has not been validated in all clinical situations.     eGFR's persistently <90 mL/min signify possible Chronic Kidney     Disease.    Anion gap  10  5 - 15    No results found.  Medical Problem List and Plan:  1. Functional deficits secondary to bilateral total knee arthroplasty 02/06/2014 for end-stage osteoarthritis  2. DVT Prophylaxis/Anticoagulation: Pradaxa. Check vascular study. Moderate bleeding episodes  3. Pain Management: Celebrex 200 mg every 12 hours, oxycodone Robaxin as needed. Monitor with increased mobility  4. Acute blood loss anemia. Followup CBC. Continue iron supplement for now  5. Neuropsych: This patient is capable of making decisions on his own behalf.  6. Skin/Wound Care: Routine skin checks  7. Chronic atrial fibrillation. Patient has been transitioned from Coumadin to Pradaxa. Cardiac rate control. Followup as needed per cardiology services  8. Hypertension. Norvasc 5 mg daily, Coreg 3.125 mg twice a day, Tikosyn 500 mcg twice a day, Avapro 300 mg daily  9. Systolic congestive heart failure. Monitor for any signs of fluid overload  10.BPH.Finasteride 1 mg daily.  11. GERD. Protonix  12. Insomnia. Continue Restoril as prior to admission   Post Admission Physician Evaluation:  1. Functional deficits secondary to endstage OA of bilateral knees s/p TKA. 2. Patient is admitted to receive  collaborative, interdisciplinary care between the physiatrist, rehab nursing staff, and therapy team. 3. Patient's level of medical complexity and substantial therapy needs in context of that medical necessity cannot be provided at a lesser intensity of care  such as a SNF. 4. Patient has experienced substantial functional loss from his/her baseline which was documented above under the "Functional History" and "Functional Status" headings. Judging by the patient's diagnosis, physical exam, and functional history, the patient has potential for functional progress which will result in measurable gains while on inpatient rehab. These gains will be of substantial and practical use upon discharge in facilitating mobility and self-care at the household level. 5. Physiatrist will provide 24 hour management of medical needs as well as oversight of the therapy plan/treatment and provide guidance as appropriate regarding the interaction of the two. 6. 24 hour rehab nursing will assist with bladder management, bowel management, safety, skin/wound care, disease management, medication administration, pain management and patient education and help integrate therapy concepts, techniques,education, etc. 7. PT will assess and treat for/with: Lower extremity strength, range of motion, stamina, balance, functional mobility, safety, adaptive techniques and equipment, knee ROM, pain mgt, ortho precautions, wound care. Goals are: mod I. 8. OT will assess and treat for/with: ADL's, functional mobility, safety, upper extremity strength, adaptive techniques and equipment, pain mgt, ortho precautions, leisure awareness. Goals are: mod I. Therapy may not yet proceed with showering this patient. 9. SLP will assess and treat for/with: n/a. Goals are: n/a. 10. Case Management and Social Worker will assess and treat for psychological issues and discharge planning. 11. Team conference will be held weekly to assess progress toward goals and  to determine barriers to discharge. 12. Patient will receive at least 3 hours of therapy per day at least 5 days per week. 13. ELOS: 7 days  14. Prognosis: excellent  Meredith Staggers, MD, [facility] 02/08/2014

## 2014-02-08 NOTE — Evaluation (Signed)
Occupational Therapy Evaluation Patient Details Name: Travis [name] MRN: [id number] DOB: [birth year] Today's Date: 02/08/2014    History of Present Illness     Clinical Impression   This 73 year old man was admitted for bil TKAs.  He was independent with adls prior to admission and needs +2 assistance for safety. He needs min A of 2 for toilet transfers and mod to max A of 2 for LB adls.  Goals in acute are for min A to supervision level.  Pt is very motivated and will benefit from reinforcement for speed of movement.  He is an excellent CIR candidate.    Follow Up Recommendations  CIR    Equipment Recommendations  None recommended by OT    Recommendations for Other Services       Precautions / Restrictions Precautions Precautions: Fall;Knee Required Braces or Orthoses: Knee Immobilizer - Left Restrictions Weight Bearing Restrictions: No      Mobility Bed Mobility         Supine to sit: Min assist     General bed mobility comments: assist for legs  Transfers   Equipment used: Rolling walker (2 wheeled) Transfers: Sit to/from Stand;Stand Pivot Transfers Sit to Stand: +2 safety/equipment;Min assist;From elevated surface Stand pivot transfers: +2 safety/equipment;Min assist (to recliner from standing at sink)       General transfer comment: cues for technique; UE/LE placement    Balance           Standing balance support: Bilateral upper extremity supported Standing balance-Leahy Scale: Poor                              ADL Overall ADL's : Needs assistance/impaired     Grooming: Oral care;Minimal assistance;Standing   Upper Body Bathing: Set up;Sitting   Lower Body Bathing: +2 for safety/equipment;Moderate assistance;Sit to/from stand   Upper Body Dressing : Set up;Sitting   Lower Body Dressing: +2 for safety/equipment;Maximal assistance;Sit to/from stand   Toilet Transfer: +2 for safety/equipment;Minimal assistance;Stand-pivot (to  recliner)   Toileting- Clothing Manipulation and Hygiene: +2 for safety/equipment;Moderate assistance;Sit to/from stand         General ADL Comments: Stood at sink for oral care.  +2 for safety for transition from sit to stand.  Needs cues to move slowly.  Pt unable to release a hand in standing for adls.    Educated on AE but did not use this session.  Demonstrated sock aide.  Pt does have a long sponge at home.  Can hook pants over RLE and lift--unable to lift LLE at this time     Vision                     Perception     Praxis      Pertinent Vitals/Pain Pain Score: 5  Pain Location: bil knees Pain Descriptors / Indicators: Sore Pain Intervention(s): Limited activity within patient's tolerance;Monitored during session;Repositioned;Ice applied     Hand Dominance     Extremity/Trunk Assessment Upper Extremity Assessment Upper Extremity Assessment: Overall WFL for tasks assessed           Communication Communication Communication: No difficulties   Cognition Arousal/Alertness: Awake/alert Behavior During Therapy: Impulsive Overall Cognitive Status: Within Functional Limits for tasks assessed                     General Comments       Exercises  Shoulder Instructions      Home Living Family/patient expects to be discharged to:: Private residence Living Arrangements: Other relatives Available Help at Discharge: Family               Bathroom Shower/Tub: Walk-in shower (accessible, no ledge)   Biochemist, clinical: Standard     Home Equipment: Environmental consultant - 2 wheels;Bedside commode;Tub bench;Cane - single point          Prior Functioning/Environment Level of Independence: Independent             OT Diagnosis: Generalized weakness   OT Problem List: Decreased strength;Decreased activity tolerance;Impaired balance (sitting and/or standing);Decreased knowledge of use of DME or AE;Pain   OT Treatment/Interventions: Self-care/ADL  training;DME and/or AE instruction;Patient/family education;Balance training    OT Goals(Current goals can be found in the care plan section) Acute Rehab OT Goals Patient Stated Goal: get back to walking OT Goal Formulation: With patient Time For Goal Achievement: 02/15/14 Potential to Achieve Goals: Good ADL Goals Pt Will Perform Grooming: with supervision;standing Pt Will Perform Lower Body Bathing: with min guard assist;sit to/from stand;with adaptive equipment Pt Will Perform Lower Body Dressing: with min assist;with adaptive equipment;sit to/from stand Pt Will Transfer to Toilet: with min guard assist;ambulating;bedside commode Pt Will Perform Toileting - Clothing Manipulation and hygiene: with supervision;sit to/from stand  OT Frequency: Min 2X/week   Barriers to D/C:            Co-evaluation              End of Session    Activity Tolerance: Patient tolerated treatment well Patient left: in chair;with call bell/phone within reach   Time: 0981-1914 OT Time Calculation (min): 22 min Charges:  OT General Charges $OT Visit: 1 Procedure OT Evaluation $Initial OT Evaluation Tier I: 1 Procedure OT Treatments $Self Care/Home Management : 8-22 mins G-Codes:    Kacen Mellinger 02/23/14, 9:14 AM  Lesle Chris, OTR/L [phone] 02/23/14

## 2014-02-08 NOTE — Discharge Summary (Signed)
Physician Discharge Summary  Patient ID: Travis [name] MRN: [id number] DOB/AGE: 1940-09-03 73 y.o.  Admit date: 02/06/2014 Discharge date:  02/08/2014  Procedures:  Procedure(s) (LRB): TOTAL KNEE BILATERAL (Bilateral)  Attending Physician:  Dr. Paralee Cancel   Admission Diagnoses:   Bilateral knee OA / pain  Discharge Diagnoses:  Principal Problem:   S/P bilateral TKA Active Problems:   Overweight (BMI 25.0-29.9)   Expected blood loss anemia  Past Medical History  Diagnosis Date  . Arthritis   . Hypertension   . GERD (gastroesophageal reflux disease)   . Bicuspid aortic valve   . Status post clamping of cerebral aneurysm 73  . Sinus bradycardia   . Paroxysmal atrial fibrillation   . Cardiomyopathy -resolved     tachycardia-induced, EF 55% June 2009  . History of cardioversion 12/16/2012  . Left bundle branch block   . CHF (congestive heart failure)     JULY 2014  . History of stomach ulcers     HPI: Travis [name], 73 y.o. male, has a history of pain and functional disability in the bilateral knee due to arthritis and has failed non-surgical conservative treatments for greater than 12 weeks to include NSAID's and/or analgesics, corticosteriod injections, use of assistive devices and activity modification. Onset of symptoms was gradual, starting 3+ years ago with gradually worsening course since that time. The patient noted no past surgery on the bilateral knees. Patient currently rates pain in the bilateral knee(s) at 7 out of 10 with activity. Patient has worsening of pain with activity and weight bearing, pain that interferes with activities of daily living, pain with passive range of motion, crepitus and joint swelling. Patient has evidence of periarticular osteophytes and joint space narrowing by imaging studies. There is no active infection. Risks, benefits and expectations were discussed with the patient. Risks including but not limited to the risk of anesthesia, blood  clots, nerve damage, blood vessel damage, failure of the prosthesis, infection and up to and including death. Patient understand the risks, benefits and expectations and wishes to proceed with surgery.   PCP: Georgetta Haber, MD   Discharged Condition: good  Hospital Course:  Patient underwent the above stated procedure on 02/06/2014. Patient tolerated the procedure well and brought to the recovery room in good condition and subsequently to the floor.  POD #1 BP: 125/66 ; Pulse: 59 ; Temp: 97.6 F (36.4 C) ; Resp: 16 Patient reports pain as mild. Only feels a bit groggy perhaps from pan meds, otherwise doing well, no events Dorsiflexion/plantar flexion intact, incision: dressing C/D/I, no cellulitis present and compartment soft.   LABS  Basename    HGB  12.6  HCT  35.9   POD #2  BP: 136/73 ; Pulse: 74 ; Temp: 97.7 F (36.5 C) ; Resp: 16 Patient reports pain as mild, pain controlled. No events throughout the night. Ready to be discharged to inpatient rehab. Dorsiflexion/plantar flexion intact, incision: dressing C/D/I, no cellulitis present and compartment soft.   LABS  Basename    HGB  10.9  HCT  31.1    Discharge Exam: General appearance: alert, cooperative and no distress Extremities: Homans sign is negative, no sign of DVT, no edema, redness or tenderness in the calves or thighs and no ulcers, gangrene or trophic changes  Disposition: CIR  with follow up in 2 weeks   Follow-up Information   Follow up with Mauri Pole, MD. Schedule an appointment as soon as possible for a visit in 2 weeks.  Specialty:  Orthopedic Surgery   Contact information:   [address] [place] [address] [phone]       Discharge Instructions   Call MD / Call 911    Complete by:  As directed   If you experience chest pain or shortness of breath, CALL 911 and be transported to the hospital emergency room.  If you develope a fever above 101 F, pus (white drainage)  or increased drainage or redness at the wound, or calf pain, call your surgeon's office.     Change dressing    Complete by:  As directed   Maintain surgical dressing for 10-14 days, or until follow up in the clinic.     Constipation Prevention    Complete by:  As directed   Drink plenty of fluids.  Prune juice may be helpful.  You may use a stool softener, such as Colace (over the counter) 100 mg twice a day.  Use MiraLax (over the counter) for constipation as needed.     Diet - low sodium heart healthy    Complete by:  As directed      Discharge instructions    Complete by:  As directed   Maintain surgical dressing for 10-14 days, or until follow up in the clinic. Follow up in 2 weeks at [facility]. Call with any questions or concerns.     Driving restrictions    Complete by:  As directed   No driving for 4 weeks     Increase activity slowly as tolerated    Complete by:  As directed      TED hose    Complete by:  As directed   Use stockings (TED hose) for 2 weeks on both leg(s).  You may remove them at night for sleeping.     Weight bearing as tolerated    Complete by:  As directed   Laterality:  bilateral  Extremity:  Lower             Medication List    STOP taking these medications       meloxicam 15 MG tablet  Commonly known as:  MOBIC     warfarin 1 MG tablet  Commonly known as:  COUMADIN      TAKE these medications       acetaminophen 500 MG tablet  Commonly known as:  TYLENOL  Take 1,000 mg by mouth every 6 (six) hours as needed for mild pain or moderate pain.     amLODipine 5 MG tablet  Commonly known as:  NORVASC  Take 5 mg by mouth every morning.     b complex vitamins tablet  Take 1 tablet by mouth daily.     carvedilol 3.125 MG tablet  Commonly known as:  COREG  Take 3.125 mg by mouth 2 (two) times daily with a meal.     dabigatran 150 MG Caps capsule  Commonly known as:  PRADAXA  Take 1 capsule (150 mg total) by mouth 2 (two)  times daily.     dofetilide 500 MCG capsule  Commonly known as:  TIKOSYN  Take 500 mcg by mouth 2 (two) times daily.     DSS 100 MG Caps  Take 100 mg by mouth 2 (two) times daily.     ferrous sulfate 325 (65 FE) MG tablet  Take 1 tablet (325 mg total) by mouth 3 (three) times daily after meals.     finasteride 1 MG tablet  Commonly known  as:  PROPECIA  Take 1 mg by mouth daily.     fish oil-omega-3 fatty acids 1000 MG capsule  Take 1 g by mouth daily.     GLUCOSAMINE RELIEF 1000 MG Tabs  Generic drug:  Glucosamine Sulfate  Take 1 tablet by mouth 2 (two) times daily.     methocarbamol 500 MG tablet  Commonly known as:  ROBAXIN  Take 1 tablet (500 mg total) by mouth every 6 (six) hours as needed for muscle spasms.     omeprazole 20 MG capsule  Commonly known as:  PRILOSEC  Take 20 mg by mouth daily.     oxyCODONE 5 MG immediate release tablet  Commonly known as:  Oxy IR/ROXICODONE  Take 1-3 tablets (5-15 mg total) by mouth every 4 (four) hours as needed for severe pain.     polyethylene glycol packet  Commonly known as:  MIRALAX / GLYCOLAX  Take 17 g by mouth 2 (two) times daily.     RA FLAX SEED OIL 1000 PO  Take 1,000 mg by mouth 3 (three) times daily.     telmisartan 74 MG tablet  Commonly known as:  MICARDIS  Take 74 mg by mouth at bedtime.     temazepam 15 MG capsule  Commonly known as:  RESTORIL  Take 15 mg by mouth at bedtime.         Signed: [place] Pugh. Oretta Berkland   PA-C  02/08/2014, 9:26 AM

## 2014-02-08 NOTE — Progress Notes (Signed)
Rehab admissions - Evaluated for possible admission.  I spoke with patient.  He would like to come to inpatient rehab prior to home.  He plans for his brother to come stay with him after rehab.  Bed available today and will admit to acute inpatient rehab today.  Call me for questions.  #[phone]

## 2014-02-08 NOTE — PMR Pre-admission (Signed)
PMR Admission Coordinator Pre-Admission Assessment  Patient: Travis [name] is an 73 y.o., male MRN: [id number] DOB: 1941/03/15 Height: 5\' 8"  (172.7 cm) Weight: 83.008 kg (183 lb)              Insurance Information HMO:      PPO:       PCP:       IPA:       80/20:       OTHER:   PRIMARY: Medicare A/B      Policy#: [id number]      Subscriber: Sheliah Plane CM Name:        Phone#:       Fax#:   Pre-Cert#:        Employer: Retired Benefits:  Phone #:       Name: Checked in [place]. Date: 04/25/05     Deduct:  $1260      Out of Pocket Max: none      Life Max: unlimited CIR: 100%      SNF: 100 days Outpatient: 80%     Co-Pay: 20% Home Health: 100%      Co-Pay: none DME: 80%     Co-Pay: 20% Providers: patient's choice  SECONDARY: BC[employer] of [place] sup      Policy#: [place] CM Name:        Phone#:       Fax#:   Pre-Cert#:        Employer: Retired Benefits:  Phone #: [phone]     Name:   Eff. Date:       Deduct:        Out of Pocket Max:        Life Max:   CIR:        SNF:   Outpatient:       Co-Pay:   Home Health:        Co-Pay:   DME:       Co-Pay:     Emergency Contact Information Contact Information   Name Relation Home Work Mobile   Quiroa,Tommy Brother [phone]  [phone]   Margy Clarks [phone]  [phone]     Current Medical History  Patient Admitting Diagnosis: B TKR   History of Present Illness: A 73 y.o. right-handed male with history of PAF maintained on chronic Coumadin therapy, bicuspid aortic valve. Admitted 02/06/2014 with end stage osteoarthritis bilateral knees and no relief with conservative care. Patient independent prior to admission with assistance from his brother. Onset of symptoms have been gradual over the last 3 years. Underwent bilateral total knee replacement 02/06/2014 per Dr. Alvan Dame. Postoperative pain management with pain medication adjusted secondary to some early lethargy. Patient is weightbearing  as tolerated. Acute blood loss anemia 12.6 from admission hemoglobin 15.6. Physical therapy evaluation completed 02/06/2014. M.D. as requested physical medicine rehabilitation consult.    Past Medical History  Past Medical History  Diagnosis Date  . Arthritis   . Hypertension   . GERD (gastroesophageal reflux disease)   . Bicuspid aortic valve   . 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 Sinus bradycardia   . Paroxysmal atrial fibrillation   . Cardiomyopathy -resolved     tachycardia-induced, EF 55% June 2009  . History of cardioversion 12/16/2012  . Left bundle branch block   . CHF (congestive heart failure)     JULY 2014  . History of stomach ulcers     Family History  family history includes  Lung cancer (age of onset: 72) in his mother; Stroke in his father and mother. There is no history of Colon cancer.  Prior Rehab/Hospitalizations:  Had outpatient therapy for shoulder in the Spring at [facility].   Current Medications  Current facility-administered medications:alum & mag hydroxide-simeth (MAALOX/MYLANTA) 200-200-20 MG/5ML suspension 30 mL, 30 mL, Oral, Q4H PRN, Lucille Passy Babish, PA-C;  amLODipine (NORVASC) tablet 5 mg, 5 mg, Oral, q morning - 10a, Lucille Passy Babish, PA-C, 5 mg at 02/08/14 2778;  bisacodyl (DULCOLAX) suppository 10 mg, 10 mg, Rectal, Daily PRN, Lucille Passy Babish, PA-C carvedilol (COREG) tablet 3.125 mg, 3.125 mg, Oral, BID WC, Lucille Passy Babish, PA-C, 3.125 mg at 02/08/14 0830;  celecoxib (CELEBREX) capsule 200 mg, 200 mg, Oral, Q12H, Lucille Passy Babish, PA-C, 200 mg at 02/08/14 0941;  dabigatran (PRADAXA) capsule 150 mg, 150 mg, Oral, Q12H, Lucille Passy Babish, PA-C, 150 mg at 02/08/14 2423;  diphenhydrAMINE (BENADRYL) capsule 25 mg, 25 mg, Oral, Q4H PRN, Nickie Retort, MD, 25 mg at 02/06/14 1740 diphenhydrAMINE (BENADRYL) capsule 25 mg, 25 mg, Oral, Q6H PRN, Lucille Passy Babish, PA-C;  diphenhydrAMINE (BENADRYL) injection  12.5 mg, 12.5 mg, Intravenous, Q4H PRN, Nickie Retort, MD;  diphenhydrAMINE (BENADRYL) injection 25 mg, 25 mg, Intramuscular, Q4H PRN, Nickie Retort, MD;  docusate sodium (COLACE) capsule 100 mg, 100 mg, Oral, BID, Lucille Passy Babish, PA-C, 100 mg at 02/08/14 5361 dofetilide (TIKOSYN) capsule 500 mcg, 500 mcg, Oral, BID, Lucille Passy Babish, PA-C, 500 mcg at 02/08/14 4431;  ferrous sulfate tablet 325 mg, 325 mg, Oral, TID PC, Lucille Passy [place], PA-C, 325 mg at 02/07/14 1926;  finasteride (PROPECIA) tablet 1 mg, 1 mg, Oral, Daily, Lucille Passy Babish, PA-C;  HYDROmorphone (DILAUDID) injection 0.5-2 mg, 0.5-2 mg, Intravenous, Q2H PRN, Lucille Passy Babish, PA-C, 1 mg at 02/07/14 0431 irbesartan (AVAPRO) tablet 300 mg, 300 mg, Oral, QHS, Mauri Pole, MD, 300 mg at 02/07/14 2135;  lactated ringers infusion, , Intravenous, Continuous, Bailey Mech, CRNA;  menthol-cetylpyridinium (CEPACOL) lozenge 3 mg, 1 lozenge, Oral, PRN, Lucille Passy Babish, PA-C, 3 mg at 02/07/14 1734;  methocarbamol (ROBAXIN) 500 mg in dextrose 5 % 50 mL IVPB, 500 mg, Intravenous, Q6H PRN, Lucille Passy Babish, PA-C methocarbamol (ROBAXIN) tablet 500 mg, 500 mg, Oral, Q6H PRN, Lucille Passy Babish, PA-C, 500 mg at 02/08/14 5400;  metoCLOPramide (REGLAN) injection 10 mg, 10 mg, Intravenous, Q8H PRN, Nickie Retort, MD;  metoCLOPramide (REGLAN) injection 5-10 mg, 5-10 mg, Intravenous, Q8H PRN, Lucille Passy Babish, PA-C;  metoCLOPramide (REGLAN) tablet 5-10 mg, 5-10 mg, Oral, Q8H PRN, Lucille Passy Babish, PA-C nalbuphine (NUBAIN) 20 MG/ML injection 5-10 mg, 5-10 mg, Subcutaneous, Q4H PRN, Mauri Pole, MD;  nalbuphine (NUBAIN) 20 MG/ML injection 5-10 mg, 5-10 mg, Intravenous, Q4H PRN, Mauri Pole, MD;  naloxone [facility]) 2 mg in dextrose 5 % 250 mL infusion, 1-4 mcg/kg/hr, Intravenous, Continuous PRN, Nickie Retort, MD;  naloxone [facility]) injection 0.4 mg, 0.4 mg, Intravenous, PRN, Nickie Retort, MD ondansetron  [facility]) injection 4 mg, 4 mg, Intravenous, Q8H PRN, Nickie Retort, MD;  ondansetron [facility]) injection 4 mg, 4 mg, Intravenous, Q6H PRN, Lucille Passy Babish, PA-C, 4 mg at 02/06/14 1739;  ondansetron (ZOFRAN) tablet 4 mg, 4 mg, Oral, Q6H PRN, Lucille Passy Babish, PA-C;  oxyCODONE (Oxy IR/ROXICODONE) immediate release tablet 5-15 mg, 5-15 mg, Oral, Q4H, Lucille Passy Babish, PA-C, 15 mg at 02/08/14 0949 pantoprazole (PROTONIX) EC tablet 40 mg, 40 mg, Oral, Daily, Lucille Passy [place],  PA-C, 40 mg at 02/08/14 0949;  phenol (CHLORASEPTIC) mouth spray 1 spray, 1 spray, Mouth/Throat, PRN, Lucille Passy Babish, PA-C;  polyethylene glycol (MIRALAX / GLYCOLAX) packet 17 g, 17 g, Oral, BID, Lucille Passy Babish, PA-C, 17 g at 02/08/14 0950 sodium chloride 0.9 % 1,000 mL with potassium chloride 10 mEq infusion, , Intravenous, Continuous, Lucille Passy Babish, PA-C, Last Rate: 10 mL/hr at 02/08/14 0126;  sodium chloride 0.9 % injection 3 mL, 3 mL, Intravenous, PRN, Nickie Retort, MD;  temazepam (RESTORIL) capsule 15 mg, 15 mg, Oral, QHS, Lucille Passy [place], PA-C, 15 mg at 02/07/14 2135  Patients Current Diet: General  Precautions / Restrictions Precautions Precautions: Fall;Knee Precaution Comments: monitor VS, use L KI Restrictions Weight Bearing Restrictions: No   Prior Activity Level Community (5-7x/wk): Went out daily.  Home Assistive Devices / Equipment Home Assistive Devices/Equipment: Eyeglasses;Walker (specify type);Bedside commode/3-in-1 Home Equipment: Walker - 2 wheels;Bedside commode;Tub bench;Cane - single point  Prior Functional Level Prior Function Level of Independence: Independent  Current Functional Level Cognition  Overall Cognitive Status: Within Functional Limits for tasks assessed Orientation Level: Oriented X4    Extremity Assessment (includes Sensation/Coordination)  Upper Extremity Assessment: Overall WFL for tasks assessed  Lower Extremity Assessment: RLE  deficits/detail;LLE deficits/detail  RLE Deficits / Details: able to lift from bed, flexed knee to 30  LLE Deficits / Details: same as R   ADLs  Overall ADL's : Needs assistance/impaired Grooming: Oral care;Minimal assistance;Standing Upper Body Bathing: Set up;Sitting Lower Body Bathing: +2 for safety/equipment;Moderate assistance;Sit to/from stand Upper Body Dressing : Set up;Sitting Lower Body Dressing: +2 for safety/equipment;Maximal assistance;Sit to/from stand Toilet Transfer: +2 for safety/equipment;Minimal assistance;Stand-pivot (to recliner) Toileting- Clothing Manipulation and Hygiene: +2 for safety/equipment;Moderate assistance;Sit to/from stand General ADL Comments: Stood at sink for oral care.  +2 for safety for transition from sit to stand.  Needs cues to move slowly.  Pt unable to release a hand in standing for adls.    Educated on AE but did not use this session.  Demonstrated sock aide.  Pt does have a long sponge at home.  Can hook pants over RLE and lift--unable to lift LLE at this time    Mobility  Overal bed mobility: Independent Bed Mobility: Supine to Sit Supine to sit: Min assist Sit to supine: Mod assist General bed mobility comments: assist for legs    Transfers  Overall transfer level: Needs assistance Equipment used: Rolling walker (2 wheeled) Transfers: Sit to/from Stand;Stand Pivot Transfers Sit to Stand: +2 safety/equipment;Min assist;From elevated surface Stand pivot transfers: +2 safety/equipment;Min assist (to recliner from standing at sink) General transfer comment: cues for technique; UE/LE placement    Ambulation / Gait / Stairs / Wheelchair Mobility  Ambulation/Gait Ambulation/Gait assistance: +2 safety/equipment;Min assist Ambulation Distance (Feet): 50 Feet Assistive device: Rolling walker (2 wheeled) Gait Pattern/deviations: Step-to pattern;Decreased step length - left;Decreased step length - right;Antalgic Gait velocity: decreased General  Gait Details: cues for sequence, position inside of RW.    Posture / Balance Dynamic Sitting Balance Sitting balance - Comments: does not feel confident to lean forward yet, increased dizziness.    Special needs/care consideration BiPAP/CPAP No CPM No Continuous Drip IV No Dialysis No         Life Vest No Oxygen No Special Bed No Trach Size No Wound Vac (area) No      Skin Has new B TKR incisions  Bowel mgmt: No BM since admission per patient Bladder mgmt: Voiding in urinal Diabetic mgmt No    Previous Home Environment Living Arrangements: Other relatives Available Help at Discharge: Family Type of Home: House Home Layout: One level Home Access: Stairs to enter Entrance Stairs-Rails: None Entrance Stairs-Number of Steps: 2 Bathroom Shower/Tub: Walk-in shower (accessible, no ledge) Biochemist, clinical: [place]: No  Discharge Living Setting Plans for Discharge Living Setting: Patient's home;Alone (Lives alone) Type of Home at Discharge: House Discharge Home Layout: One level Discharge Home Access: Stairs to enter Entrance Stairs-Number of Steps: 2 steps at garage entrance. Does the patient have any problems obtaining your medications?: No  Social/Family/Support Systems Patient Roles: Other (Comment) (Has a brother who lives in [place].) Contact Information: Lanard Arguijo - brother Anticipated Caregiver: brother Anticipated Caregiver's Contact Information: Tommy (h) [phone] (c) [phone] Ability/Limitations of Caregiver: Brother lives in [place] and can come stay with patient day of or day before discharge home.  Brother can help with meals but is not a good "caregiver" per patient. Caregiver Availability: 24/7 Discharge Plan Discussed with Primary Caregiver: Yes Is Caregiver In Agreement with Plan?: Yes Does Caregiver/Family have Issues with Lodging/Transportation while Pt is in Rehab?: No  Goals/Additional  Needs Patient/Family Goal for Rehab: PT/OT mod I goals Expected length of stay: 7 days Cultural Considerations: None Dietary Needs: Regular diet, thin liquids Equipment Needs: TBD Pt/Family Agrees to Admission and willing to participate: Yes Program Orientation Provided & Reviewed with Pt/Caregiver Including Roles  & Responsibilities: Yes  Decrease burden of Care through IP rehab admission: N/A  Possible need for SNF placement upon discharge: Not anticipated  Patient Condition: This patient's condition remains as documented in the consult dated 02/08/14, in which the Rehabilitation Physician determined and documented that the patient's condition is appropriate for intensive rehabilitative care in an inpatient rehabilitation facility. Will admit to inpatient rehab today.  Preadmission Screen Completed By:  Retta Diones, 02/08/2014 10:23 AM ______________________________________________________________________   Discussed status with Dr. Naaman Plummer on 02/08/14 at 1023 and received telephone approval for admission today.  Admission Coordinator:  Retta Diones, time1023/Date09/16/15

## 2014-02-08 NOTE — Progress Notes (Signed)
Physical Medicine and Rehabilitation Consult  Reason for Consult: Bilateral TKA  Referring Physician: Dr. Alvan Dame  HPI: Travis [name] is a 73 y.o. right-handed male with history of PAF maintained on chronic Coumadin therapy, bicuspid aortic valve. Admitted 02/06/2014 with end stage osteoarthritis bilateral knees and no relief with conservative care. Patient independent prior to admission with assistance from his brother. Onset of symptoms have been gradual over the last 3 years. Underwent bilateral total knee replacement 02/06/2014 per Dr. Alvan Dame. Postoperative pain management with pain medication adjusted secondary to some early lethargy. Patient is weightbearing as tolerated. Acute blood loss anemia 12.6 from admission hemoglobin 15.6. Physical therapy evaluation completed 02/06/2014. M.D. as requested physical medicine rehabilitation consult.  Review of Systems  Cardiovascular: Positive for palpitations and leg swelling.  Gastrointestinal: Positive for constipation.  GERD  Musculoskeletal: Positive for joint pain and myalgias.  Psychiatric/Behavioral: The patient has insomnia.  All other systems reviewed and are negative.   Past Medical History   Diagnosis  Date   .  Arthritis    .  Hypertension    .  GERD (gastroesophageal reflux disease)    .  Bicuspid aortic valve    .  Status post clamping of cerebral aneurysm  80   .  Sinus bradycardia    .  Paroxysmal atrial fibrillation    .  Cardiomyopathy -resolved      tachycardia-induced, EF 55% June 2009   .  History of cardioversion  12/16/2012   .  Left bundle branch block    .  CHF (congestive heart failure)      JULY 2014   .  History of stomach ulcers     Past Surgical History   Procedure  Laterality  Date   .  Cerebral anuersym post clips     .  Rotator cuff repair       lf   .  Fractured left arm     .  Left tendon repair       lft foot   .  Hernia repair       lft   .  Wrist ganglion excision       lft   .  Tonsillectomy      .  Inguinal hernia repair   07/02/2011     Procedure: LAPAROSCOPIC INGUINAL HERNIA; Surgeon: Harl Bowie, MD; Location: [place]; Service: General; Laterality: Left; Laparoscopic left inguinal hernia repair and mesh   .  Cardiac catheterization     .  Cardioversion  N/A  12/16/2012     Procedure: CARDIOVERSION; Surgeon: Lelon Perla, MD; Location[facility] ENDOSCOPY; Service: Cardiovascular; Laterality: N/A;   .  Total knee arthroplasty  Bilateral  02/06/2014     Procedure: TOTAL KNEE BILATERAL; Surgeon: Mauri Pole, MD; Location: [facility]S; Service: Orthopedics; Laterality: Bilateral;    Family History   Problem  Relation  Age of Onset   .  Stroke  Father    .  Lung cancer  Mother  99   .  Stroke  Mother    .  Colon cancer  Neg Hx     Social History: reports that he quit smoking about 27 years ago. He has never used smokeless tobacco. He reports that he drinks alcohol. He reports that he does not use illicit drugs.  Allergies: No Known Allergies  Medications Prior to Admission   Medication  Sig  Dispense  Refill   .  acetaminophen (TYLENOL) 500 MG tablet  Take  1,000 mg by mouth every 6 (six) hours as needed for mild pain or moderate pain.     Marland Kitchen  amLODipine (NORVASC) 5 MG tablet  Take 5 mg by mouth every morning.     Marland Kitchen  b complex vitamins tablet  Take 1 tablet by mouth daily.     .  carvedilol (COREG) 3.125 MG tablet  Take 3.125 mg by mouth 2 (two) times daily with a meal.     .  dofetilide (TIKOSYN) 500 MCG capsule  Take 500 mcg by mouth 2 (two) times daily.     .  finasteride (PROPECIA) 1 MG tablet  Take 1 mg by mouth daily.     .  fish oil-omega-3 fatty acids 1000 MG capsule  Take 1 g by mouth daily.     .  Flaxseed, Linseed, (RA FLAX SEED OIL 1000 PO)  Take 1,000 mg by mouth 3 (three) times daily.     .  Glucosamine Sulfate (GLUCOSAMINE RELIEF) 1000 MG TABS  Take 1 tablet by mouth 2 (two) times daily.     .  meloxicam (MOBIC) 15 MG tablet  Take 15 mg by mouth daily.     Marland Kitchen  omeprazole  (PRILOSEC) 20 MG capsule  Take 20 mg by mouth daily.     Marland Kitchen  telmisartan (MICARDIS) 80 MG tablet  Take 80 mg by mouth at bedtime.     .  temazepam (RESTORIL) 15 MG capsule  Take 15 mg by mouth at bedtime.     Marland Kitchen  warfarin (COUMADIN) 1 MG tablet  Take 1 mg by mouth daily.     .  dabigatran (PRADAXA) 150 MG CAPS capsule  Take 1 capsule (150 mg total) by mouth 2 (two) times daily.  60 capsule  5    Home:  Home Living  Family/patient expects to be discharged to:: Private residence  Living Arrangements: Other relatives (brother)  Available Help at Discharge: Family  Type of Home: House  Home Access: Stairs to enter  Technical brewer of Steps: 2  Entrance Stairs-Rails: None  Home Layout: One level  Home Equipment: Environmental consultant - 2 wheels;Bedside commode;Tub bench;Cane - single point  Functional History:  Prior Function  Level of Independence: Independent  Functional Status:  Mobility:  Bed Mobility  Overal bed mobility: Needs Assistance  Bed Mobility: Sit to Supine  Supine to sit: Min assist  Sit to supine: Mod assist  General bed mobility comments: assist for both legs to edge of bed and to lower legs to the floor.. Then assist fro both legs onto bed, cues for technique, use of upper body and how to reciprocal scoot hips out to edge.     ADL:   Cognition:  Cognition  Overall Cognitive Status: Within Functional Limits for tasks assessed  Orientation Level: Oriented X4  Cognition  Arousal/Alertness: Awake/alert  Behavior During Therapy: Anxious (about feeling dizzy and standing up.)  Overall Cognitive Status: Within Functional Limits for tasks assessed  Blood pressure 136/70, pulse 64, temperature 98.7 F (37.1 C), temperature source Axillary, resp. rate 16, height 5\' 8"  (1.727 m), weight 83.008 kg (183 lb), SpO2 95.00%.  Physical Exam  Vitals reviewed.  Constitutional: He appears well-developed.  HENT:  Head: Normocephalic.  Eyes: EOM are normal.  Neck: Neck supple. No  thyromegaly present.  Cardiovascular:  Cardiac rate controlled  Respiratory: Effort normal and breath sounds normal. No respiratory distress.  GI: Soft. Bowel sounds are normal. He exhibits no distension.  Neurological: He is alert.  Follows full commands. Oriented to person place  Skin:  Bilateral knee incisions are dressed   Results for orders placed during the hospital encounter of 02/06/14 (from the past 24 hour(s))   CBC Status: Abnormal    Collection Time    02/07/14 4:34 AM   Result  Value  Ref Range    WBC  7.9  4.0 - 10.5 K/uL    RBC  3.65 (*)  4.22 - 5.81 MIL/uL    Hemoglobin  12.6 (*)  13.0 - 17.0 g/dL    HCT  35.9 (*)  39.0 - 52.0 %    MCV  98.4  78.0 - 100.0 fL    MCH  34.5 (*)  26.0 - 34.0 pg    MCHC  35.1  30.0 - 36.0 g/dL    RDW  13.4  11.5 - 15.5 %    Platelets  180  150 - 400 K/uL   BASIC METABOLIC PANEL Status: Abnormal    Collection Time    02/07/14 4:34 AM   Result  Value  Ref Range    Sodium  132 (*)  137 - 147 mEq/L    Potassium  3.7  3.7 - 5.3 mEq/L    Chloride  95 (*)  96 - 112 mEq/L    CO2  26  19 - 32 mEq/L    Glucose, Bld  171 (*)  70 - 99 mg/dL    BUN  13  6 - 23 mg/dL    Creatinine, Ser  0.61  0.50 - 1.35 mg/dL    Calcium  8.4  8.4 - 10.5 mg/dL    GFR calc non Af Amer  >90  >90 mL/min    GFR calc Af Amer  >90  >90 mL/min    Anion gap  11  5 - 15    No results found.  Assessment/Plan:  Diagnosis: endstage OA of both knees s/p bilateral TKA's  1. Does the need for close, 24 hr/day medical supervision in concert with the patient's rehab needs make it unreasonable for this patient to be served in a less intensive setting? Yes 2. Co-Morbidities requiring supervision/potential complications: htn, abla, paf 3. Due to bladder management, bowel management, safety, skin/wound care, disease management, medication administration, pain management and patient education, does the patient require 24 hr/day rehab nursing? Yes 4. Does the patient require  coordinated care of a physician, rehab nurse, PT (1-2 hrs/day, 5 days/week) and OT (1-2 hrs/day, 5 days/week) to address physical and functional deficits in the context of the above medical diagnosis(es)? Yes Addressing deficits in the following areas: balance, endurance, locomotion, strength, transferring, bowel/bladder control, bathing, dressing, feeding, grooming, toileting and psychosocial support 5. Can the patient actively participate in an intensive therapy program of at least 3 hrs of therapy per day at least 5 days per week? Yes 6. The potential for patient to make measurable gains while on inpatient rehab is excellent 7. Anticipated functional outcomes upon discharge from inpatient rehab are modified independent with PT, modified independent with OT, n/a with SLP. 8. Estimated rehab length of stay to reach the above functional goals is: 7 days 9. Does the patient have adequate social supports to accommodate these discharge functional goals? Yes 10. Anticipated D/C setting: Home 11. Anticipated post D/C treatments: [facility] therapy 12. Overall Rehab/Functional Prognosis: excellent RECOMMENDATIONS:  This patient's condition is appropriate for continued rehabilitative care in the following setting: CIR  Patient has agreed to participate in recommended program. Yes  Note that  insurance prior authorization may be required for reimbursement for recommended care.  Comment: Rehab Admissions Coordinator to follow up.  Thanks,  Meredith Staggers, MD, Mellody Drown  02/07/2014  Revision History...      Date/Time User Action    02/08/2014 8:29 AM Meredith Staggers, MD Sign    02/07/2014 12:48 PM Cathlyn Parsons, PA-C [place]   View Details Report    Routing History.Marland KitchenMarland Kitchen

## 2014-02-09 ENCOUNTER — Inpatient Hospital Stay (HOSPITAL_COMMUNITY): Payer: Medicare Other | Admitting: *Deleted

## 2014-02-09 ENCOUNTER — Inpatient Hospital Stay (HOSPITAL_COMMUNITY): Payer: Medicare Other | Admitting: Occupational Therapy

## 2014-02-09 LAB — CBC WITH DIFFERENTIAL/PLATELET
BASOS ABS: 0 10*3/uL (ref 0.0–0.1)
Basophils Relative: 0 % (ref 0–1)
EOS PCT: 3 % (ref 0–5)
Eosinophils Absolute: 0.2 10*3/uL (ref 0.0–0.7)
HEMATOCRIT: 28.7 % — AB (ref 39.0–52.0)
Hemoglobin: 10.2 g/dL — ABNORMAL LOW (ref 13.0–17.0)
LYMPHS PCT: 20 % (ref 12–46)
Lymphs Abs: 1.6 10*3/uL (ref 0.7–4.0)
MCH: 34.1 pg — ABNORMAL HIGH (ref 26.0–34.0)
MCHC: 35.5 g/dL (ref 30.0–36.0)
MCV: 96 fL (ref 78.0–100.0)
MONO ABS: 1.1 10*3/uL — AB (ref 0.1–1.0)
Monocytes Relative: 14 % — ABNORMAL HIGH (ref 3–12)
NEUTROS ABS: 5 10*3/uL (ref 1.7–7.7)
Neutrophils Relative %: 63 % (ref 43–77)
Platelets: 147 10*3/uL — ABNORMAL LOW (ref 150–400)
RBC: 2.99 MIL/uL — AB (ref 4.22–5.81)
RDW: 13 % (ref 11.5–15.5)
WBC: 8 10*3/uL (ref 4.0–10.5)

## 2014-02-09 LAB — COMPREHENSIVE METABOLIC PANEL
ALT: 16 U/L (ref 0–53)
AST: 25 U/L (ref 0–37)
Albumin: 2.8 g/dL — ABNORMAL LOW (ref 3.5–5.2)
Alkaline Phosphatase: 52 U/L (ref 39–117)
Anion gap: 12 (ref 5–15)
BILIRUBIN TOTAL: 0.8 mg/dL (ref 0.3–1.2)
BUN: 9 mg/dL (ref 6–23)
CHLORIDE: 92 meq/L — AB (ref 96–112)
CO2: 26 mEq/L (ref 19–32)
CREATININE: 0.56 mg/dL (ref 0.50–1.35)
Calcium: 8.2 mg/dL — ABNORMAL LOW (ref 8.4–10.5)
GFR calc non Af Amer: >90 mL/min (ref >90)
Glucose, Bld: 121 mg/dL — ABNORMAL HIGH (ref 70–99)
Potassium: 3.7 mEq/L (ref 3.7–5.3)
SODIUM: 130 meq/L — AB (ref 137–147)
Total Protein: 5.9 g/dL — ABNORMAL LOW (ref 6.0–8.3)

## 2014-02-09 MED ORDER — POTASSIUM CHLORIDE CRYS ER 20 MEQ PO TBCR
20.0000 meq | EXTENDED_RELEASE_TABLET | Freq: Every day | ORAL | Status: DC
  Administered 2014-02-09 – 2014-02-14 (×6): 20 meq via ORAL
  Filled 2014-02-09 (×6): qty 1

## 2014-02-09 NOTE — Progress Notes (Signed)
Patient information reviewed and entered into eRehab system by Tejuan Gholson, RN, CRRN, PPS Coordinator.  Information including medical coding and functional independence measure will be reviewed and updated through discharge.     Per nursing patient was given "Data Collection Information Summary for Patients in Inpatient Rehabilitation Facilities with attached "Privacy Act Statement-Health Care Records" upon admission.  

## 2014-02-09 NOTE — Plan of Care (Signed)
Problem: RH BOWEL ELIMINATION Goal: RH STG MANAGE BOWEL WITH ASSISTANCE STG Manage Bowel with Assistance.  Outcome: Not Progressing Patient has not had BM since 9/13.  Received Miralax and Colace tonight.  Denies discomfort.

## 2014-02-09 NOTE — Evaluation (Signed)
Occupational Therapy Assessment and Plan And Treatment Notes  Patient Details  Name: Travis [name] MRN: [id number] Date of Birth: Apr 30, 1941  OT Diagnosis: acute pain, disturbance of vision, muscle weakness (generalized), pain in joint and decreased endurance Rehab Potential: Rehab Potential: Good ELOS: 5-7 days   Today's Date: 02/09/2014 OT Individual Time: 1005-1100 and 1300-1405 OT Individual Time Calculation (min): 55 min and 65 min     Problem List:  Patient Active Problem List   Diagnosis Date Noted  . Overweight (BMI 25.0-29.9) 02/08/2014  . Expected blood loss anemia 02/08/2014  . S/P TKR (total knee replacement) 02/08/2014  . S/P bilateral TKA 02/06/2014  . Encounter for therapeutic drug monitoring 08/08/2013  . Sinus bradycardia 01/04/2013  . Pulmonary hypertension 03/09/2012  . Hyponatremia 02/21/2012  . Bicuspid aortic valve   . Paroxysmal atrial fibrillation   . Hypertension   . Cardiomyopathy, rate related with resolution now recurrent 12/08/2011  . Recurrent left inguinal hernia 05/12/2011  . Left bundle branch block 12/10/2010  . DRY MOUTH 11/07/2009  . ING HERN W/O MENTION OBST/GANGREN UNILAT/UNSPEC 08/08/2009  . CAROTID BRUIT 07/05/2009  . TRANSIENT DISORDER INITIATING/MAINTAINING SLEEP 09/11/2008  . GERD 01/07/2008  . OSTEOARTHRITIS 02/04/2007  . GANGLION CYST, WRIST, LEFT 02/04/2007    Past Medical History:  Past Medical History  Diagnosis Date  . Arthritis   . Hypertension   . GERD (gastroesophageal reflux disease)   . Bicuspid aortic valve   . 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 Sinus bradycardia   . Paroxysmal atrial fibrillation   . Cardiomyopathy -resolved     tachycardia-induced, EF 55% June 2009  . History of cardioversion 12/16/2012  . Left bundle branch block   . CHF (congestive heart failure)     JULY 2014  . History of stomach ulcers    Past Surgical History:  Past Surgical History  Procedure Laterality Date  .  Cerebral anuersym post clips    . Rotator cuff repair      lf  . Fractured left arm    . Left tendon repair      lft foot  . Hernia repair      lft  . Wrist ganglion excision      lft  . Tonsillectomy    . Inguinal hernia repair  07/02/2011    Procedure: LAPAROSCOPIC INGUINAL HERNIA;  Surgeon: Harl Bowie, MD;  Location: [place];  Service: General;  Laterality: Left;  Laparoscopic left inguinal hernia repair and mesh  . Cardiac catheterization    . Cardioversion N/A 12/16/2012    Procedure: CARDIOVERSION;  Surgeon: Lelon Perla, MD;  Location: [facility] ENDOSCOPY;  Service: Cardiovascular;  Laterality: N/A;  . Total knee arthroplasty Bilateral 02/06/2014    Procedure: TOTAL KNEE BILATERAL;  Surgeon: Mauri Pole, MD;  Location: [facility];  Service: Orthopedics;  Laterality: Bilateral;    Assessment & Plan Clinical Impression: Travis [name] is a 73 y.o. right-handed male with history of PAF maintained on chronic Coumadin therapy, bicuspid aortic valve, systolic congestive heart failure. Admitted 02/06/2014 with end stage osteoarthritis bilateral knees and no relief with conservative care. Patient independent prior to admission with assistance from his brother. Onset of symptoms have been gradual over the last 3 years. Underwent bilateral total knee replacement 02/06/2014 per Dr. Alvan Dame. Postoperative pain management with pain medication adjusted secondary to some early lethargy. Patient is weightbearing as tolerated. Patient's Coumadin was transitioned to Pradaxa per cardiology services during his hospital stay for chronic  atrial fibrillation. Cardiac rate remained controlled no chest pain or shortness of breath. Acute blood loss anemia 10.9 from admission hemoglobin 15.6.  Patient transferred to CIR on 02/08/2014 .    Patient currently requires min with basic self-care skills and IADL secondary to increased pain, muscle weakness, decreased cardiorespiratoy endurance and decreased standing balance  and decreased balance strategies.  Prior to hospitalization, patient could complete was independent with all BADL & IADL to include driving.  Patient will benefit from skilled intervention to increase independence with basic self-care skills and increase level of independence with iADL prior to discharge with brother who lives out of town to say for few days and as long as needed..  Anticipate patient will require intermittent supervision and no further OT follow recommended at this time.  OT - End of Session Endurance Deficit: Yes Endurance Deficit Description: fatigues quickly, requires frequent rest breaks OT Assessment Rehab Potential: Good Barriers to Discharge:  (none) OT Patient demonstrates impairments in the following area(s): Balance;Edema;Endurance;Pain OT Basic ADL's Functional Problem(s): Grooming;Bathing;Dressing;Toileting OT Advanced ADL's Functional Problem(s): Simple Meal Preparation;Light Housekeeping OT Transfers Functional Problem(s): Toilet;Tub/Shower OT Plan OT Intensity: Minimum of 1-2 x/day, 45 to 90 minutes OT Frequency: 5 out of 7 days OT Duration/Estimated Length of Stay: 5-7 days OT Treatment/Interventions: Balance/vestibular training;Community reintegration;DME/adaptive equipment instruction;Discharge planning;Functional mobility training;Patient/family education;Pain management;Self Care/advanced ADL retraining;Therapeutic Exercise;Therapeutic Activities OT Basic Self-Care Anticipated Outcome(s): Mod I with AE OT Toileting Anticipated Outcome(s): Mod I OT Bathroom Transfers Anticipated Outcome(s): Mod I toilet and Supervision for shower OT Recommendation Recommendations for Other Services:  (none at this time) Patient destination: Home Follow Up Recommendations: None Equipment Details: patient recently received bathroom DME.  TBD if this equipment is adequate  Skilled Therapeutic Intervention 1)  OT evaluation and self care retraining to include sponge bath  and dressing.  Focused tasks on use of AE and adaptive techniques, sit><stand, and activity tolerance.  Reviewed role of OT, collaborated on LTGs, reviewed discharge planning and bathroom DME.  Patient plans to purchase AE from gift shop.  2)  Patient resting in bed upon arrival.  Engaged in toileting-toilet transfers, walk-in shower transfer (dry run), review of DME that he already has at home (3n1 & shower chair), brief exploration of therapy kitchen, transfer to chair (rocker) in therapy apartment and w/c mobility.  Focused session on activity tolerance, walker safety, sit><stands, ambulate in hall short distance with RW.  OT Evaluation Precautions/Restrictions  Precautions Precautions: Fall;Knee Precaution Comments: L KI Required Braces or Orthoses: Knee Immobilizer - Left Knee Immobilizer - Left: Discontinue once straight leg raise with < 10 degree lag Restrictions Weight Bearing Restrictions: No Pain 1) 2/10 before activity and occasionally went up to 10/10 with activity and standing, premedicated, rest and repositioned 2) 6/10 before activity and occasionally went up to 8/10 with activity, premedicated, rest, repositioned, and NT provided ice following session. Home Living/Prior Functioning Home Living Available Help at Discharge: Family (brother from [place] can stay upon discharge for however long patient needs) Type of Home: House Home Access: Stairs to enter [employer] of Steps: 2 Entrance Stairs-Rails: None Home Layout: One level  Lives With: Alone Prior Function Level of Independence: Independent with gait;Independent with transfers;Independent with basic ADLs  Able to Take Stairs?: Yes Driving: Yes Vocation: Retired ADL See Leslie below for details Vision/Perception  Vision- History Baseline Vision/History: Wears glasses Wears Glasses: Reading only Patient Visual Report: No change from baseline Vision- Assessment Vision Assessment?: No apparent  visual deficits  Cognition Overall  Cognitive Status: Within Functional Limits for tasks assessed Orientation Level: Oriented X4 Memory: Appears intact Awareness: Appears intact Safety/Judgment: Appears intact Sensation Sensation Light Touch: Appears Intact Proprioception: Appears Intact Coordination Gross Motor Movements are Fluid and Coordinated: No Fine Motor Movements are Fluid and Coordinated: No Motor  Motor Motor: Within Functional Limits Mobility  Bed Mobility Bed Mobility: Sit to Supine;Supine to Sit Supine to Sit: 5: Supervision;HOB elevated Supine to Sit Details: Verbal cues for precautions/safety;Verbal cues for sequencing;Verbal cues for technique Sit to Supine: 3: Mod assist;HOB flat Sit to Supine - Details: Manual facilitation for weight shifting;Verbal cues for sequencing Sit to Supine - Details (indicate cue type and reason): Patient requires lifting assistance for B LE management. Transfers Sit to Stand: 4: Min assist;With armrests;With upper extremity assist;From chair/3-in-1;From bed Sit to Stand Details: Visual cues/gestures for sequencing;Verbal cues for sequencing;Verbal cues for technique;Verbal cues for precautions/safety;Manual facilitation for weight shifting Sit to Stand Details (indicate cue type and reason): Visual demonstration and verbal cues for walking feet backwards. Stand to Sit: 4: Min assist;With upper extremity assist;With armrests;To bed;To chair/3-in-1 Stand to Sit Details (indicate cue type and reason): Visual cues/gestures for sequencing;Verbal cues for sequencing;Manual facilitation for weight shifting;Verbal cues for precautions/safety;Verbal cues for technique Stand to Sit Details: Visual demonstration and verbal cues for walking feet forwards.  Trunk/Postural Assessment  Cervical Assessment Cervical Assessment: Within Functional Limits Thoracic Assessment Thoracic Assessment: Within Functional Limits Lumbar Assessment Lumbar  Assessment: Within Functional Limits Postural Control Postural Control: Within Functional Limits  Balance Balance Balance Assessed: Yes Static Sitting Balance Static Sitting - Balance Support: Bilateral upper extremity supported;No upper extremity supported;Feet supported Static Sitting - Level of Assistance: 6: Modified independent (Device/Increase time) Static Standing Balance Static Standing - Balance Support: Bilateral upper extremity supported Static Standing - Level of Assistance: 4: Min assist;5: Stand by assistance Extremity/Trunk Assessment RUE Assessment RUE Assessment: Within Functional Limits LUE Assessment LUE Assessment: Within Functional Limits  FIM:  FIM - Eating Eating Activity: 7: Complete independence:no helper FIM - Grooming Grooming Steps: Wash, rinse, dry face;Wash, rinse, dry hands;Oral care, brush teeth, clean dentures;Brush, comb hair Grooming: 4: Steadying assist  or patient completes 3 of 4 or 4 of 5 steps FIM - Bathing Bathing Steps Patient Completed: Chest;Right Arm;Left Arm;Abdomen;Front perineal area;Buttocks;Right upper leg;Left upper leg Bathing: 4: Min-Patient completes 8-9 [id number] parts or 75+ percent FIM - Upper Body Dressing/Undressing Upper body dressing/undressing steps patient completed: Thread/unthread right sleeve of pullover shirt/dresss;Thread/unthread left sleeve of pullover shirt/dress;Put head through opening of pull over shirt/dress;Pull shirt over trunk Upper body dressing/undressing: 5: Set-up assist to: Obtain clothing/put away FIM - Lower Body Dressing/Undressing Lower body dressing/undressing steps patient completed: Pull underwear up/down;Thread/unthread left underwear leg;Thread/unthread left pants leg;Don/Doff right sock;Don/Doff left sock Lower body dressing/undressing: 3: Mod-Patient completed 50-74% of tasks FIM - Toileting Toileting steps completed by patient: Adjust clothing prior to toileting;Performs perineal  hygiene;Adjust clothing after toileting Toileting Assistive Devices: Grab bar or rail for support Toileting: 4: Steadying assist FIM - BControl and instrumentation engineerDevices: Walker;Arm rests Bed/Chair Transfer: 5: Supine > Sit: Supervision (verbal cues/safety issues);4: Bed > Chair or W/C: Min A (steadying Pt. > 75%);4: Chair or W/C > Bed: Min A (steadying Pt. > 75%);4: Sit > Supine: Min A (steadying pt. > 75%/lift 1 leg)   Refer to Care Plan for Long Term Goals  Recommendations for other services: None  Discharge Criteria: Patient will be discharged from OT if patient refuses treatment 3 consecutive times without medical  reason, if treatment goals not met, if there is a change in medical status, if patient makes no progress towards goals or if patient is discharged from hospital.  The above assessment, treatment plan, treatment alternatives and goals were discussed and mutually agreed upon: by patient  Ramaya Guile 02/09/2014, 12:51 PM

## 2014-02-09 NOTE — Progress Notes (Signed)
[facility] PHYSICAL MEDICINE & REHABILITATION     PROGRESS NOTE    Subjective/Complaints: Had a reasonable night. Slept well. Knee pain controlled. Utilizing ice A  review of systems has been performed and if not noted above is otherwise negative.   Objective: Vital Signs: Blood pressure 116/63, pulse 84, temperature 98.7 F (37.1 C), temperature source Oral, resp. rate 18, weight 91.087 kg (200 lb 13 oz), SpO2 97.00%. No results found.  Recent Labs  02/08/14 0453 02/09/14 0525  WBC 10.4 8.0  HGB 10.9* 10.2*  HCT 31.1* 28.7*  PLT 159 147*    Recent Labs  02/08/14 0453 02/09/14 0525  NA 132* 130*  K 4.0 3.7  CL 95* 92*  GLUCOSE 136* 121*  BUN 7 9  CREATININE 0.58 0.56  CALCIUM 8.7 8.2*   CBG (last 3)  No results found for this basename: GLUCAP,  in the last 72 hours  Wt Readings from Last 3 Encounters:  02/08/14 91.087 kg (200 lb 13 oz)  02/06/14 83.008 kg (183 lb)  02/06/14 83.008 kg (183 lb)    Physical Exam:  Constitutional: He appears well-developed.  HENT: oral mucosa pink and moist  Head: Normocephalic.  Eyes: EOM are normal.  Neck: Neck supple. No thyromegaly present.  Cardiovascular:  Cardiac rate controlled  Respiratory: Effort normal and breath sounds normal. No respiratory distress.  GI: Soft. Bowel sounds are normal. He exhibits no distension.  Neurological: He is alert.  Follows full commands. Oriented to person place. Strength intact. Sensory intact. CN exam normal.  Skin:  Bilateral knee incisions are dressed and appropriately tender  Musc: pt able to right left leg off bed 4inches. Unable to do straight leg raise on left.  Psych: pleasant and cooperative   Assessment/Plan: 1. Functional deficits secondary to OA of knees, s/p bilateral total knee replacements which require 3+ hours per day of interdisciplinary therapy in a comprehensive inpatient rehab setting. Physiatrist is providing close team supervision and 24 hour management of  active medical problems listed below. Physiatrist and rehab team continue to assess barriers to discharge/monitor patient progress toward functional and medical goals. FIM:                   Comprehension Comprehension Mode: Auditory Comprehension: 7-Follows complex conversation/direction: With no assist  Expression Expression Mode: Verbal Expression: 7-Expresses complex ideas: With no assist  Social Interaction Social Interaction: 7-Interacts appropriately with others - No medications needed.  Problem Solving Problem Solving: 7-Solves complex problems: Recognizes & self-corrects  Memory Memory: 7-Complete Independence: No helper  Medical Problem List and Plan:  1. Functional deficits secondary to bilateral total knee arthroplasty 02/06/2014 for end-stage osteoarthritis  2. DVT Prophylaxis/Anticoagulation: Pradaxa. Check vascular study. Moderate bleeding episodes  3. Pain Management: Celebrex 200 mg every 12 hours, oxycodone Robaxin as needed. Monitor with increased mobility  4. Acute blood loss anemia. Followup CBC. Continue iron supplement for now  5. Neuropsych: This patient is capable of making decisions on his own behalf.  6. Skin/Wound Care: Routine skin checks  7. Chronic atrial fibrillation. Patient has been transitioned from Coumadin to Pradaxa. Cardiac rate control.  8. Hypertension. Norvasc 5 mg daily, Coreg 3.125 mg twice a day, Tikosyn 500 mcg twice a day, Avapro 300 mg daily  9. Systolic congestive heart failure. Monitor for any signs of fluid overload --daily weights 10.BPH. Finasteride 1 mg daily.  11. GERD. Protonix  12. Insomnia. Continue Restoril as prior to admission  LOS (Days) 1 A FACE TO FACE  EVALUATION WAS PERFORMED  Bluford Sedler T 02/09/2014 8:03 AM

## 2014-02-09 NOTE — Progress Notes (Signed)
02/09/14 0830 nsg RN asked for patient's home med Propecia patient claims he has it but has not been taking it if he does only taking  As needed  ; asked patient that we need to send it to pharmacy patient refuses.Pharmacy notified.

## 2014-02-09 NOTE — Evaluation (Signed)
Physical Therapy Assessment and Plan  Patient Details  Name: Travis [name] MRN: [id number] Date of Birth: [birth year]  PT Diagnosis: Difficulty walking, Muscle weakness, Osteoarthritis and Pain in B knees Rehab Potential: Excellent ELOS: 5-7 days   Today's Date: 02/09/2014 PT Individual Time: 1100-1200 PT Individual Time Calculation (min): 60 min    Problem List:  Patient Active Problem List   Diagnosis Date Noted  . Overweight (BMI 25.0-29.9) 02/08/2014  . Expected blood loss anemia 02/08/2014  . S/P TKR (total knee replacement) 02/08/2014  . S/P bilateral TKA 02/06/2014  . Encounter for therapeutic drug monitoring 08/08/2013  . Sinus bradycardia 01/04/2013  . Pulmonary hypertension 03/09/2012  . Hyponatremia 02/21/2012  . Bicuspid aortic valve   . Paroxysmal atrial fibrillation   . Hypertension   . Cardiomyopathy, rate related with resolution now recurrent 12/08/2011  . Recurrent left inguinal hernia 05/12/2011  . Left bundle branch block 12/10/2010  . DRY MOUTH 11/07/2009  . ING HERN W/O MENTION OBST/GANGREN UNILAT/UNSPEC 08/08/2009  . CAROTID BRUIT 07/05/2009  . TRANSIENT DISORDER INITIATING/MAINTAINING SLEEP 09/11/2008  . GERD 01/07/2008  . OSTEOARTHRITIS 02/04/2007  . GANGLION CYST, WRIST, LEFT 02/04/2007    Past Medical History:  Past Medical History  Diagnosis Date  . Arthritis   . Hypertension   . GERD (gastroesophageal reflux disease)   . Bicuspid aortic valve   . 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 Sinus bradycardia   . Paroxysmal atrial fibrillation   . Cardiomyopathy -resolved     tachycardia-induced, EF 55% June 2009  . History of cardioversion 12/16/2012  . Left bundle branch block   . CHF (congestive heart failure)     JULY 2014  . History of stomach ulcers    Past Surgical History:  Past Surgical History  Procedure Laterality Date  . Cerebral anuersym post clips    . Rotator cuff repair      lf  . Fractured left arm    . Left  tendon repair      lft foot  . Hernia repair      lft  . Wrist ganglion excision      lft  . Tonsillectomy    . Inguinal hernia repair  07/02/2011    Procedure: LAPAROSCOPIC INGUINAL HERNIA;  Surgeon: Harl Bowie, MD;  Location: [facility];  Service: General;  Laterality: Left;  Laparoscopic left inguinal hernia repair and mesh  . Cardiac catheterization    . Cardioversion N/A 12/16/2012    Procedure: CARDIOVERSION;  Surgeon: Lelon Perla, MD;  Location: [facility] ENDOSCOPY;  Service: Cardiovascular;  Laterality: N/A;  . Total knee arthroplasty Bilateral 02/06/2014    Procedure: TOTAL KNEE BILATERAL;  Surgeon: Mauri Pole, MD;  Location: [facility] ORS;  Service: Orthopedics;  Laterality: Bilateral;    Assessment & Plan Clinical Impression: Travis [name] is a 73 y.o. right-handed male with history of PAF maintained on chronic Coumadin therapy, bicuspid aortic valve, systolic congestive heart failure. Admitted 02/06/2014 with end stage osteoarthritis bilateral knees and no relief with conservative care. Patient independent prior to admission with assistance from his brother. Onset of symptoms have been gradual over the last 3 years. Underwent bilateral total knee replacement 02/06/2014 per Dr. Alvan Dame. Postoperative pain management with pain medication adjusted secondary to some early lethargy. Patient is weightbearing as tolerated. Patient's Coumadin was transitioned to Pradaxa per cardiology services during his hospital stay for chronic atrial fibrillation. Cardiac rate remained controlled no chest pain or shortness of breath. Acute blood  loss anemia 10.9 from admission hemoglobin 15.6. Physical therapy and occupational evaluations completed 02/06/2014. M.D. as requested physical medicine rehabilitation consult. Patient was admitted for comprehensive rehabilitation program. Patient transferred to CIR on 02/08/2014 .   Patient currently requires min with mobility secondary to muscle weakness and muscle joint  tightness and decreased cardiorespiratoy endurance.  Prior to hospitalization, patient was independent  with mobility and lived with Alone in a House home.  Home access is 2Stairs to enter.  Patient will benefit from skilled PT intervention to maximize safe functional mobility, minimize fall risk and decrease caregiver burden for planned discharge home with assist from brother initially.  Anticipate patient will benefit from follow up OP at discharge.  PT - End of Session Activity Tolerance: Tolerates 30+ min activity with multiple rests Endurance Deficit: Yes Endurance Deficit Description: fatigues quickly, requires frequent rest breaks PT Assessment Rehab Potential: Excellent PT Patient demonstrates impairments in the following area(s): Balance;Endurance;Motor;Pain;Safety PT Transfers Functional Problem(s): Bed Mobility;Bed to Chair;Car;Furniture PT Locomotion Functional Problem(s): Ambulation;Stairs PT Plan PT Intensity: Minimum of 1-2 x/day ,45 to 90 minutes PT Frequency: 5 out of 7 days PT Duration Estimated Length of Stay: 5-7 days PT Treatment/Interventions: Ambulation/gait training;Community reintegration;DME/adaptive equipment instruction;Neuromuscular re-education;Psychosocial support;Stair training;UE/LE Strength taining/ROM;Wheelchair propulsion/positioning;UE/LE Coordination activities;Therapeutic Activities;Skin care/wound management;Pain management;Discharge planning;Balance/vestibular training;Disease management/prevention;Functional mobility training;Patient/family education;Therapeutic Exercise PT Transfers Anticipated Outcome(s): mod I PT Locomotion Anticipated Outcome(s): mod I PT Recommendation Follow Up Recommendations: Outpatient PT Patient destination: Home Equipment Recommended: Rolling walker with 5" wheels;To be determined Equipment Details: Patient owns shower bench and 4 [place]; educated on safety concerns with [phone] and that RW is recommended  Skilled Therapeutic  Intervention Discussed falls risk, safety within room, and focus of therapy during stay. Discussed possible LOS, goals, and f/u therapy.  PT Evaluation Precautions/Restrictions Precautions Precautions: Fall;Knee Precaution Comments: L KI Required Braces or Orthoses: Knee Immobilizer - Left Knee Immobilizer - Left: Discontinue once straight leg raise with < 10 degree lag Restrictions Weight Bearing Restrictions: No General Chart Reviewed: Yes Family/Caregiver Present: No  Pain Pain Assessment Pain Assessment: 0-10 Pain Score: 5  Pain Type: Surgical pain Pain Location: Knee Pain Orientation: Right;Left Pain Descriptors / Indicators: Aching Pain Intervention(s): RN made aware;Repositioned;Ambulation/increased activity Multiple Pain Sites: No Home Living/Prior Functioning Home Living Available Help at Discharge: Family (brother from [place] can stay upon discharge for however long patient needs) Type of Home: House Home Access: Stairs to enter [employer] of Steps: 2 Entrance Stairs-Rails: None Home Layout: One level  Lives With: Alone Prior Function Level of Independence: Independent with gait;Independent with transfers;Independent with basic ADLs  Able to Take Stairs?: Yes Driving: Yes Vocation: Retired Vision/Perception    Wears reading glasses; No change in vision from baseline; No perceptual/praxis deficits. Cognition Overall Cognitive Status: Within Functional Limits for tasks assessed Orientation Level: Oriented X4 Memory: Appears intact Awareness: Appears intact Safety/Judgment: Appears intact Sensation Sensation Light Touch: Appears Intact Proprioception: Appears Intact Coordination Gross Motor Movements are Fluid and Coordinated: No Fine Motor Movements are Fluid and Coordinated: No Motor  Motor Motor: Within Functional Limits  Mobility Bed Mobility Bed Mobility: Sit to Supine;Supine to Sit Supine to Sit: 5: Supervision;HOB  elevated Supine to Sit Details: Verbal cues for precautions/safety;Verbal cues for sequencing;Verbal cues for technique Sit to Supine: 3: Mod assist;HOB flat Sit to Supine - Details: Manual facilitation for weight shifting;Verbal cues for sequencing Sit to Supine - Details (indicate cue type and reason): Patient requires lifting assistance for B LE management. Transfers Transfers: Yes Sit  to Stand: 4: Min assist;With armrests;With upper extremity assist;From chair/3-in-1;From bed Sit to Stand Details: Visual cues/gestures for sequencing;Verbal cues for sequencing;Verbal cues for technique;Verbal cues for precautions/safety;Manual facilitation for weight shifting Sit to Stand Details (indicate cue type and reason): Visual demonstration and verbal cues for walking feet backwards. Stand to Sit: 4: Min assist;With upper extremity assist;With armrests;To bed;To chair/3-in-1 Stand to Sit Details (indicate cue type and reason): Visual cues/gestures for sequencing;Verbal cues for sequencing;Manual facilitation for weight shifting;Verbal cues for precautions/safety;Verbal cues for technique Stand to Sit Details: Visual demonstration and verbal cues for walking feet forwards. Stand Pivot Transfers: 4: Min assist;With armrests Stand Pivot Transfer Details: Visual cues/gestures for sequencing;Verbal cues for sequencing;Manual facilitation for weight shifting;Verbal cues for precautions/safety;Verbal cues for technique Locomotion  Ambulation Ambulation: Yes Ambulation/Gait Assistance: 4: Min assist Ambulation Distance (Feet): 55 Feet Assistive device: Rolling walker Ambulation/Gait Assistance Details: Verbal cues for precautions/safety;Verbal cues for gait pattern;Verbal cues for safe use of DME/AE;Tactile cues for posture Ambulation/Gait Assistance Details: Patient performed gait training 58' x1 with RW and minA in controlled environment. Patient requires verbal cues for decreasing reliance of UEs on  RW. Gait Gait: Yes Gait Pattern: Impaired Gait Pattern: Step-to pattern;Decreased step length - right;Decreased step length - left;Decreased stride length;Decreased hip/knee flexion - right;Decreased hip/knee flexion - left;Left circumduction;Right circumduction;Antalgic;Trunk flexed;Decreased stance time - left Stairs / Additional Locomotion Stairs: Yes Stairs Assistance: 4: Min assist Stairs Assistance Details: Visual cues/gestures for sequencing;Verbal cues for sequencing;Verbal cues for technique;Verbal cues for precautions/safety Stairs Assistance Details (indicate cue type and reason): Ascends forwards, descends backwards Stair Management Technique: Two rails;Step to pattern;Backwards;Forwards Number of Stairs: 2 Height of Stairs: 4 Wheelchair Mobility Wheelchair Mobility: Yes Wheelchair Assistance: 1: +1 Total assist Wheelchair Parts Management: Needs assistance  Trunk/Postural Assessment  Cervical Assessment Cervical Assessment: Within Functional Limits Thoracic Assessment Thoracic Assessment: Within Functional Limits Lumbar Assessment Lumbar Assessment: Within Functional Limits Postural Control Postural Control: Within Functional Limits  Balance Balance Balance Assessed: Yes Static Sitting Balance Static Sitting - Balance Support: Bilateral upper extremity supported;No upper extremity supported;Feet supported Static Sitting - Level of Assistance: 6: Modified independent (Device/Increase time) Static Standing Balance Static Standing - Balance Support: Bilateral upper extremity supported Static Standing - Level of Assistance: 4: Min assist;5: Stand by assistance Extremity Assessment  RLE Assessment RLE Assessment: Exceptions to [facility] RLE AROM (degrees) Overall AROM Right Lower Extremity: Deficits;Due to pain;Other (Comment);Due to decreased strength (s/p TKA) Right Knee Extension: 9 (lacking 9 degrees extension) Right Knee Flexion: 84 RLE Strength RLE Overall Strength:  Deficits;Due to pain;Other (Comment) (due to s/p TKA) RLE Overall Strength Comments: Grossly 2+/5; unable to perform SLR LLE Assessment LLE Assessment: Exceptions to [facility] LLE AROM (degrees) Overall AROM Left Lower Extremity: Deficits;Due to decreased strength;Due to pain;Other (Comment) (s/p TKA) Left Knee Extension: 13 (lacking 13 degrees extension) Left Knee Flexion: 74 LLE Strength LLE Overall Strength: Deficits;Due to pain;Other (Comment) (s/p TKA) LLE Overall Strength Comments: Grossly 2+/5; Unable to perform SLR  FIM:  FIM - Control and instrumentation engineer Devices: Environmental consultant;Arm rests;Bed rails;HOB elevated Bed/Chair Transfer: 5: Supine > Sit: Supervision (verbal cues/safety issues);3: Sit > Supine: Mod A (lifting assist/Pt. 50-74%/lift 2 legs);4: Bed > Chair or W/C: Min A (steadying Pt. > 75%);4: Chair or W/C > Bed: Min A (steadying Pt. > 75%) FIM - Locomotion: Wheelchair Locomotion: Wheelchair: 1: Total Assistance/staff pushes wheelchair (Pt<25%) FIM - Locomotion: Ambulation Locomotion: Ambulation Assistive Devices: Administrator Ambulation/Gait Assistance: 4: Min assist Locomotion: Ambulation: 2: Travels 50 -  149 ft with minimal assistance (Pt.>75%) FIM - Locomotion: Stairs Locomotion: Stairs Assistive Devices: Hand rail - 2 Locomotion: Stairs: 1: Up and Down < 4 stairs with minimal assistance (Pt.>75%)   Refer to Care Plan for Long Term Goals  Recommendations for other services: None  Discharge Criteria: Patient will be discharged from PT if patient refuses treatment 3 consecutive times without medical reason, if treatment goals not met, if there is a change in medical status, if patient makes no progress towards goals or if patient is discharged from hospital.  The above assessment, treatment plan, treatment alternatives and goals were discussed and mutually agreed upon: by patient  Lillia Abed. Jaclin Finks, PT, DPT 02/09/2014, 12:32 PM

## 2014-02-09 NOTE — Progress Notes (Signed)
VASCULAR LAB PRELIMINARY  PRELIMINARY  PRELIMINARY  PRELIMINARY Late entry   Bilateral lower extremity venous duplex completed 02-08-14.    Preliminary report:  Bilateral:  No evidence of DVT, superficial thrombosis, or Baker's Cyst.    Dilynn Munroe, RVT 02/09/2014, 3:45 PM

## 2014-02-09 NOTE — Plan of Care (Signed)
Problem: RH BOWEL ELIMINATION Goal: RH STG MANAGE BOWEL WITH ASSISTANCE STG Manage Bowel with Assistance.  Outcome: Not Progressing Patient refuses prn laxatives; wants to maintain daily laxatives claims he does this if he's not at home

## 2014-02-10 ENCOUNTER — Inpatient Hospital Stay (HOSPITAL_COMMUNITY): Payer: Medicare Other | Admitting: *Deleted

## 2014-02-10 ENCOUNTER — Inpatient Hospital Stay (HOSPITAL_COMMUNITY): Payer: Medicare Other | Admitting: Occupational Therapy

## 2014-02-10 MED ORDER — SIMETHICONE 80 MG PO CHEW
80.0000 mg | CHEWABLE_TABLET | Freq: Four times a day (QID) | ORAL | Status: DC | PRN
  Administered 2014-02-10: 80 mg via ORAL
  Filled 2014-02-10: qty 1

## 2014-02-10 NOTE — Progress Notes (Signed)
Physical Therapy Session Note  Patient Details  Name: Travis [name] MRN: [id number] Date of Birth: 1940-06-28  Today's Date: 02/10/2014 PT Individual Time: 1100-1200 and 1500-1600 PT Individual Time Calculation (min): 60 min and 60 min  Short Term Goals: Week 1:  PT Short Term Goal 1 (Week 1): STGs = LTGs  Skilled Therapeutic Interventions/Progress Updates:    AM Session: Patient received sitting in wheelchair. Session focused on functional transfers, gait training, and B knee ROM. Wheelchair mobility 115' x1 for activity tolerance with B UE and supervision/cues for technique. Functional transfers with min guard throughout session. Patient continues to require visual demo and verbal cues for appropriately "walking" legs forward/backward during sit<>stands. Gait training 76' x2 and 88' x1 with RW and min guard progressing to supervision. Patient able to progress from step-to to step through pattern.  ROM: Sitting EOM, heel slides with pillow case under foot 10" hold x10 each LE; supine quad sets with manually facilitated extension pressure from therapist, 10x10"; prolonged extension stretch with B ankles elevated on pillows x2'. Tested SLR on L LE and patient still demonstrating lag >10 degrees, L KI unable to be discharged at this time.  Patient performed ambulation from doorway of room into bathroom with RW and supervision, able to manage clothing with supervision and sit on toilet. Patient left sitting on toilet with RN aware.  PM Session: Patient received semi-reclined in bed. Session focused on functional transfers, gait training, stair negotiation, and B knee ROM. Patient continues to require verbal cues for appropriately "walking" legs forward/backward during sit<>stands. Gait training 125' x1 with RW and supervision. NuStep activity to facilitate improved B knee flex/ext, emphasis on prolonged hold in flex/ext. Stair negotiation x3 with B handrails and min guard, step to pattern. Wheelchair  mobility 115' x1 for activity tolerance with B UE and supervision/cues for technique. Patient returned to room and left supine in bed with all needs within reach.  Therapy Documentation Precautions:  Precautions Precautions: Fall;Knee Precaution Comments: L KI Required Braces or Orthoses: Knee Immobilizer - Left Knee Immobilizer - Left: Discontinue once straight leg raise with < 10 degree lag Restrictions Weight Bearing Restrictions: No Pain: Pain Assessment Pain Assessment: 0-10 Pain Score: 6  (with flex exercises) Pain Type: Surgical pain Pain Location: Knee Pain Orientation: Right;Left Pain Descriptors / Indicators: Aching;Sore Pain Frequency: Constant Pain Onset: On-going Patients Stated Pain Goal: 2 Pain Intervention(s): RN made aware;Repositioned;Ambulation/increased activity Multiple Pain Sites: No Locomotion : Ambulation Ambulation/Gait Assistance: 4: Min guard;5: Supervision Wheelchair Mobility Distance: 115   See FIM for current functional status  Therapy/Group: Individual Therapy  Lillia Abed. Minyon Billiter, PT, DPT 02/10/2014, 12:05 PM

## 2014-02-10 NOTE — Progress Notes (Signed)
Social Work Social Work Assessment and Plan  Patient Details  Name: Travis [name] MRN: [id number] Date of Birth: 1940-08-26  Today's Date: 02/10/2014  Problem List:  Patient Active Problem List   Diagnosis Date Noted  . Overweight (BMI 25.0-29.9) 02/08/2014  . Expected blood loss anemia 02/08/2014  . S/P TKR (total knee replacement) 02/08/2014  . S/P bilateral TKA 02/06/2014  . Encounter for therapeutic drug monitoring 08/08/2013  . Sinus bradycardia 01/04/2013  . Pulmonary hypertension 03/09/2012  . Hyponatremia 02/21/2012  . Bicuspid aortic valve   . Paroxysmal atrial fibrillation   . Hypertension   . Cardiomyopathy, rate related with resolution now recurrent 12/08/2011  . Recurrent left inguinal hernia 05/12/2011  . Left bundle branch block 12/10/2010  . DRY MOUTH 11/07/2009  . ING HERN W/O MENTION OBST/GANGREN UNILAT/UNSPEC 08/08/2009  . CAROTID BRUIT 07/05/2009  . TRANSIENT DISORDER INITIATING/MAINTAINING SLEEP 09/11/2008  . GERD 01/07/2008  . OSTEOARTHRITIS 02/04/2007  . GANGLION CYST, WRIST, LEFT 02/04/2007   Past Medical History:  Past Medical History  Diagnosis Date  . Arthritis   . Hypertension   . GERD (gastroesophageal reflux disease)   . Bicuspid aortic valve   . 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 Sinus bradycardia   . Paroxysmal atrial fibrillation   . Cardiomyopathy -resolved     tachycardia-induced, EF 55% June 2009  . History of cardioversion 12/16/2012  . Left bundle branch block   . CHF (congestive heart failure)     JULY 2014  . History of stomach ulcers    Past Surgical History:  Past Surgical History  Procedure Laterality Date  . Cerebral anuersym post clips    . Rotator cuff repair      lf  . Fractured left arm    . Left tendon repair      lft foot  . Hernia repair      lft  . Wrist ganglion excision      lft  . Tonsillectomy    . Inguinal hernia repair  07/02/2011    Procedure: LAPAROSCOPIC INGUINAL HERNIA;  Surgeon:  Harl Bowie, MD;  Location: [facility];  Service: General;  Laterality: Left;  Laparoscopic left inguinal hernia repair and mesh  . Cardiac catheterization    . Cardioversion N/A 12/16/2012    Procedure: CARDIOVERSION;  Surgeon: Lelon Perla, MD;  Location: [facility];  Service: Cardiovascular;  Laterality: N/A;  . Total knee arthroplasty Bilateral 02/06/2014    Procedure: TOTAL KNEE BILATERAL;  Surgeon: Mauri Pole, MD;  Location: [facility];  Service: Orthopedics;  Laterality: Bilateral;   Social History:  reports that he quit smoking about 27 years ago. He has never used smokeless tobacco. He reports that he drinks about .5 ounces of alcohol per week. He reports that he does not use illicit drugs.  Family / Support Systems Marital Status: Single Other Supports: brother, Antwyne Pingree @ (H) [phone] or (C) [phone];  local friend, Lanier Clam 2 (H) [phone] or [phone] Anticipated Caregiver: brother Ability/Limitations of Caregiver: Brother lives in [place] and can come stay with patient day of or day before discharge home.  Brother can help with meals but is not a good "caregiver" per patient. Caregiver Availability: 24/7 Family Dynamics: pt describes brother as his primary support and "he'll be here as soon as I let him know when I'm leaving."  Social History Preferred language: English Religion: Episcopalian Cultural Background: NA Education: college Read: Yes Write: Yes Employment Status: Retired Theatre stage manager  Legal Issues: none Guardian/Conservator: none -  per MD, pt capable of making decisions on his own behalf   Abuse/Neglect Physical Abuse: Denies Verbal Abuse: Denies Sexual Abuse: Denies Exploitation of patient/patient's resources: Denies Self-Neglect: Denies  Emotional Status Pt's affect, behavior adn adjustment status: Pt very pleasant and motivated for therapies.  Denies any emotional distress.  Pleased with progress being made to date.    Recent Psychosocial Issues: None Pyschiatric History: None Substance Abuse History: None  Patient / Family Perceptions, Expectations & Goals Pt/Family understanding of illness & functional limitations: pt with very good understanding of the surgery performed and of his current functional limitations. Premorbid pt/family roles/activities: Completely independent PTA and active Anticipated changes in roles/activities/participation: little change anticipated as he has mod i goals Pt/family expectations/goals: pt's goals mod i goals.  Community Resources [employer]s: None Premorbid Home Care/DME Agencies: None Transportation available at discharge: yes  Discharge Planning Living Arrangements: Alone Support Systems: Other relatives;Friends/neighbors Type of Residence: Private residence Insurance Resources: Education officer, museum (specify) ([employer] of [place]) Financial Resources: [place] Referred: No Living Expenses: Own Money Management: Patient Does the patient have any problems obtaining your medications?: No Home Management: pt Patient/Family Preliminary Plans: pt to return to his own home with brother to assist intially Social Work Anticipated Follow Up Needs: HH/OP Expected length of stay: 5-7 days  Clinical Impression Very pleasant gentleman here following bil. TKR.  Very motivated and pleased with progress so far. Denies any emotional distress. Good support of brother.  Anticipate short LOS.  Gilbert Manolis 02/10/2014, 12:09 PM

## 2014-02-10 NOTE — IPOC Note (Signed)
Overall Plan of Care [name]) Patient Details Name: Travis [name] MRN: [id number] DOB: 06-13-1940  Admitting Diagnosis: B TKR  Hospital Problems: Active Problems:   S/P TKR (total knee replacement)     Functional Problem List: Nursing Bowel;Motor;Pain;Skin Integrity  PT Balance;Endurance;Motor;Pain;Safety  OT Balance;Edema;Endurance;Pain  SLP    TR         Basic ADL's: OT Grooming;Bathing;Dressing;Toileting     Advanced  ADL's: OT Simple Meal Preparation;Light Housekeeping     Transfers: PT Bed Mobility;Bed to Chair;Car;Furniture  OT Toilet;Tub/Shower     Locomotion: PT Ambulation;Stairs     Additional Impairments: OT    SLP        TR      Anticipated Outcomes Item Anticipated Outcome  Self Feeding    Swallowing      Basic self-care  Mod I with AE  Toileting  Mod I   Bathroom Transfers Mod I toilet and Supervision for shower  Bowel/Bladder  Continent of bowel and bladder  Transfers  mod I  Locomotion  mod I  Communication     Cognition     Pain  </=3  Safety/Judgment  No falls with injury   Therapy Plan: PT Intensity: Minimum of 1-2 x/day ,45 to 90 minutes PT Frequency: 5 out of 7 days PT Duration Estimated Length of Stay: 5-7 days OT Intensity: Minimum of 1-2 x/day, 45 to 90 minutes OT Frequency: 5 out of 7 days OT Duration/Estimated Length of Stay: 5-7 days         Team Interventions: Nursing Interventions Patient/Family Education;Bowel Management;Pain Management;Medication Management;Skin Care/Wound Management;Psychosocial Support  PT interventions Ambulation/gait training;Community reintegration;DME/adaptive equipment instruction;Neuromuscular re-education;Psychosocial support;Stair training;UE/LE Strength taining/ROM;Wheelchair propulsion/positioning;UE/LE Coordination activities;Therapeutic Activities;Skin care/wound management;Pain management;Discharge planning;Balance/vestibular training;Disease management/prevention;Functional  mobility training;Patient/family education;Therapeutic Exercise  OT Interventions Balance/vestibular training;Community reintegration;DME/adaptive equipment instruction;Discharge planning;Functional mobility training;Patient/family education;Pain management;Self Care/advanced ADL retraining;Therapeutic Exercise;Therapeutic Activities  SLP Interventions    TR Interventions    SW/CM Interventions Discharge Planning;Psychosocial Support;Patient/Family Education    Team Discharge Planning: Destination: PT-Home ,OT- Home , SLP-  Projected Follow-up: PT-Outpatient PT, OT-  None, SLP-  Projected Equipment Needs: PT-Rolling walker with 5" wheels;To be determined, OT-  , SLP-  Equipment Details: PT-Patient owns shower bench and 4 WW; educated on safety concerns with [phone] and that RW is recommended, OT-patient recently received bathroom DME.  TBD if this equipment is adequate Patient/family involved in discharge planning: PT- Patient,  OT-Patient, SLP-   MD ELOS: 5-7 days Medical Rehab Prognosis:  Excellent Assessment: The patient has been admitted for CIR therapies with the diagnosis of bilateral OA of knees s/p TKA's. The team will be addressing functional mobility, strength, stamina, balance, safety, adaptive techniques and equipment, self-care, bowel and bladder mgt, patient and caregiver education, knee rom, wound care, pain control. Goals have been set at mod I for mobility and self-care.    Meredith Staggers, MD, FAAPMR      See Team Conference Notes for weekly updates to the plan of care

## 2014-02-10 NOTE — Progress Notes (Signed)
[place] PHYSICAL MEDICINE & REHABILITATION     PROGRESS NOTE    Subjective/Complaints: Had a good night. Worked hard in therapy.  A  review of systems has been performed and if not noted above is otherwise negative.   Objective: Vital Signs: Blood pressure 140/69, pulse 101, temperature 97.8 F (36.6 C), temperature source Oral, resp. rate 18, weight 91.087 kg (200 lb 13 oz), SpO2 100.00%. No results found.  Recent Labs  02/08/14 0453 02/09/14 0525  WBC 10.4 8.0  HGB 10.9* 10.2*  HCT 31.1* 28.7*  PLT 159 147*    Recent Labs  02/08/14 0453 02/09/14 0525  NA 132* 130*  K 4.0 3.7  CL 95* 92*  GLUCOSE 136* 121*  BUN 7 9  CREATININE 0.58 0.56  CALCIUM 8.7 8.2*   CBG (last 3)  No results found for this basename: GLUCAP,  in the last 72 hours  Wt Readings from Last 3 Encounters:  02/08/14 91.087 kg (200 lb 13 oz)  02/06/14 83.008 kg (183 lb)  02/06/14 83.008 kg (183 lb)    Physical Exam:  Constitutional: He appears well-developed.  HENT: oral mucosa pink and moist  Head: Normocephalic.  Eyes: EOM are normal.  Neck: Neck supple. No thyromegaly present.  Cardiovascular:  Cardiac rate controlled  Respiratory: Effort normal and breath sounds normal. No respiratory distress.  GI: Soft. Bowel sounds are normal. He exhibits no distension.  Neurological: He is alert.  Follows full commands. Oriented to person place. Strength intact. Sensory intact. CN exam normal.  Skin:  Bilateral knee incisions are dressed and appropriately tender. Musc: RK--70deg flexion, LK--80deg, edema left greater than right knee Psych: pleasant and cooperative   Assessment/Plan: 1. Functional deficits secondary to OA of knees, s/p bilateral total knee replacements which require 3+ hours per day of interdisciplinary therapy in a comprehensive inpatient rehab setting. Physiatrist is providing close team supervision and 24 hour management of active medical problems listed  below. Physiatrist and rehab team continue to assess barriers to discharge/monitor patient progress toward functional and medical goals.  PT MAY SHOWER----with current dressings in place---  FIM: FIM - Bathing Bathing Steps Patient Completed: Chest;Right Arm;Left Arm;Abdomen;Front perineal area;Buttocks;Right upper leg;Left upper leg Bathing: 4: Min-Patient completes 8-9 46f 10 parts or 75+ percent  FIM - Upper Body Dressing/Undressing Upper body dressing/undressing steps patient completed: Thread/unthread right sleeve of pullover shirt/dresss;Thread/unthread left sleeve of pullover shirt/dress;Put head through opening of pull over shirt/dress;Pull shirt over trunk Upper body dressing/undressing: 5: Set-up assist to: Obtain clothing/put away FIM - Lower Body Dressing/Undressing Lower body dressing/undressing steps patient completed: Pull underwear up/down;Thread/unthread left underwear leg;Thread/unthread left pants leg;Don/Doff right sock;Don/Doff left sock Lower body dressing/undressing: 3: Mod-Patient completed 50-74% of tasks  FIM - Toileting Toileting steps completed by patient: Adjust clothing prior to toileting;Performs perineal hygiene;Adjust clothing after toileting Toileting Assistive Devices: Grab bar or rail for support Toileting: 4: Steadying assist     FIM - Control and instrumentation engineer Devices: Walker;Arm rests Bed/Chair Transfer: 5: Supine > Sit: Supervision (verbal cues/safety issues);4: Bed > Chair or W/C: Min A (steadying Pt. > 75%);4: Chair or W/C > Bed: Min A (steadying Pt. > 75%);4: Sit > Supine: Min A (steadying pt. > 75%/lift 1 leg)  FIM - Locomotion: Wheelchair Locomotion: Wheelchair: 1: Total Assistance/staff pushes wheelchair (Pt<25%) FIM - Locomotion: Ambulation Locomotion: Ambulation Assistive Devices: Administrator Ambulation/Gait Assistance: 4: Min assist Locomotion: Ambulation: 2: Travels 50 - 149 ft with minimal assistance  (Pt.>75%)  Comprehension Comprehension Mode:  Auditory Comprehension: 7-Follows complex conversation/direction: With no assist  Expression Expression Mode: Verbal Expression: 7-Expresses complex ideas: With no assist  Social Interaction Social Interaction: 7-Interacts appropriately with others - No medications needed.  Problem Solving Problem Solving: 7-Solves complex problems: Recognizes & self-corrects  Memory Memory: 7-Complete Independence: No helper  Medical Problem List and Plan:  1. Functional deficits secondary to bilateral total knee arthroplasty 02/06/2014 for end-stage osteoarthritis  2. DVT Prophylaxis/Anticoagulation: Pradaxa. Vascular study neg 3. Pain Management: Celebrex 200 mg every 12 hours, oxycodone Robaxin as needed.  Ample ice 4. Acute blood loss anemia. Followup CBC. Continue iron supplement for now  5. Neuropsych: This patient is capable of making decisions on his own behalf.  6. Skin/Wound Care: Routine skin checks  7. Chronic atrial fibrillation. Patient has been transitioned from Coumadin to Pradaxa. Cardiac rate control.  8. Hypertension. Norvasc 5 mg daily, Coreg 3.125 mg twice a day, Tikosyn 500 mcg twice a day, Avapro 300 mg daily  9. Systolic congestive heart failure. Monitor for any signs of fluid overload --daily weights 10.BPH. Finasteride 1 mg daily.  11. GERD. Protonix  12. Insomnia. Continue Restoril as prior to admission  LOS (Days) 2 A FACE TO FACE EVALUATION WAS PERFORMED  Aya Geisel T 02/10/2014 7:47 AM

## 2014-02-10 NOTE — Progress Notes (Signed)
Occupational Therapy Session Note  Patient Details  Name: Travis [name] MRN: [id number] Date of Birth: 1941-01-23  Today's Date: 02/10/2014 OT Individual Time: 1000-1100 OT Individual Time Calculation (min): 60 min   Short Term Goals: Week 1:  OT Short Term Goal 1 (Week 1): STGs=LTGs due to estimated short LOS  Skilled Therapeutic Interventions/Progress Updates:  Patient resting in bed upon arrival.  Engaged in self care retraining to include shower, dress and grooming.  Focused session on sit><stands, activity tolerance, walker safety, use of AE and DME to improve safety and independence.  Patient now has all of his own AE.  He reports that at home he has a tub bench and a shower chair with low arms.  We discussed options for which DME to use as well as placement of DME given the set up of his bathroom and shower.  Patient drew a lay out of the shower-please refer to drawing when simulating and practicing.  Therapy Documentation Precautions:  Precautions Precautions: Fall;Knee Precaution Comments: L KI Required Braces or Orthoses: Knee Immobilizer - Left Knee Immobilizer - Left: Discontinue once straight leg raise with < 10 degree lag Restrictions Weight Bearing Restrictions: No Pain: 4/10 bilateral knees ADL: See FIM for current functional status  Therapy/Group: Individual Therapy  Asiya Cutbirth, [place] 02/10/2014, 12:34 PM

## 2014-02-10 NOTE — Care Management Note (Signed)
Inpatient [facility] Individual Statement of Services  Patient Name:  Travis [name]  Date:  02/10/2014  Welcome to the [facility].  Our goal is to provide you with an individualized program based on your diagnosis and situation, designed to meet your specific needs.  With this comprehensive rehabilitation program, you will be expected to participate in at least 3 hours of rehabilitation therapies Monday-Friday, with modified therapy programming on the weekends.  Your rehabilitation program will include the following services:  Physical Therapy (PT), Occupational Therapy (OT), 24 hour per day rehabilitation nursing, Therapeutic Recreaction (TR), Case Management (Social Worker), Rehabilitation Medicine, Nutrition Services and Pharmacy Services  Weekly team conferences will be held on Tuesdays to discuss your progress.  Your Social Worker will talk with you frequently to get your input and to update you on team discussions.  Team conferences with you and your family in attendance may also be held.  Expected length of stay: 5-7 days  Overall anticipated outcome: modified independent  Depending on your progress and recovery, your program may change. Your Social Worker will coordinate services and will keep you informed of any changes. Your Social Worker's name and contact numbers are listed  below.  The following services may also be recommended but are not provided by the [facility] will be made to provide these services after discharge if needed.  Arrangements include referral to agencies that provide these services.  Your insurance has been verified to be:  Medicare and [place] Your primary doctor is:  Dr. Benay Pillow  Pertinent information will be shared with your doctor and your insurance  company.  Social Worker:  [place], [place] or ([phone]   Information discussed with and copy given to patient by: Lennart Pall, 02/10/2014, 1:06 PM

## 2014-02-11 ENCOUNTER — Inpatient Hospital Stay (HOSPITAL_COMMUNITY): Payer: Medicare Other | Admitting: *Deleted

## 2014-02-11 ENCOUNTER — Inpatient Hospital Stay (HOSPITAL_COMMUNITY): Payer: Medicare Other | Admitting: Occupational Therapy

## 2014-02-11 DIAGNOSIS — Z5189 Encounter for other specified aftercare: Secondary | ICD-10-CM

## 2014-02-11 DIAGNOSIS — M1710 Unilateral primary osteoarthritis, unspecified knee: Secondary | ICD-10-CM

## 2014-02-11 DIAGNOSIS — D62 Acute posthemorrhagic anemia: Secondary | ICD-10-CM

## 2014-02-11 MED ORDER — OXYCODONE HCL 5 MG PO TABS
5.0000 mg | ORAL_TABLET | Freq: Four times a day (QID) | ORAL | Status: DC | PRN
  Administered 2014-02-11 (×3): 10 mg via ORAL
  Administered 2014-02-12 (×2): 5 mg via ORAL
  Administered 2014-02-12: 10 mg via ORAL
  Filled 2014-02-11 (×2): qty 2
  Filled 2014-02-11: qty 1
  Filled 2014-02-11 (×3): qty 2

## 2014-02-11 NOTE — Progress Notes (Signed)
Occupational Therapy Session Note  Patient Details  Name: Travis [name] MRN: [id number] Date of Birth: Oct 08, 1940  Today's Date: 02/11/2014 OT Individual Time: 0930-1015 OT Individual Time Calculation (min): 45 min    Short Term Goals: Week 1:  OT Short Term Goal 1 (Week 1): STGs=LTGs due to estimated short LOS  Skilled Therapeutic Interventions/Progress Updates:   Patient seen for ADL retraining and instruction this morning.  Patient agreeable to treatment.  Patient completes transfers with CGA to minimum assistance for bed to/from wheelchair, ambulation to transfer tub bench, and sit to/from stand.  Patient bathes seated on transfer tub bench with supervision.  Patient leans right/left to wash buttocks/peri area.  Use of long handled sponge to assist with B feet.  Patient completes dressing seated on BSC outside of shower with use of AE (reacher, long handled shoe horn, and sockaid). Requires assist for TED hose.  Patient stands at sink to brush teeth with CGA to SBA.  Patient motivated and participates well in therapy session.  Rehab potential is excellent.  Patient would benefit from continued IPR.   Therapy Documentation Precautions:  Precautions Precautions: Fall;Knee Precaution Comments: L KI d/c'd 9/19 Required Braces or Orthoses: Knee Immobilizer - Left Knee Immobilizer - Left: Discontinue once straight leg raise with < 10 degree lag (L KI d/c'd 9/19) Restrictions Weight Bearing Restrictions: No Pain: Pain Assessment Pain Assessment: 0-10 Pain Score: 6  Pain Type: Surgical pain Pain Location: Knee Pain Orientation: Right;Left Pain Descriptors / Indicators: Aching;Sore Pain Onset: With Activity Pain Intervention(s): RN made aware;Repositioned;Ambulation/increased activity Multiple Pain Sites: No  See FIM for current functional status  Therapy/Group: Individual Therapy  Osa Craver 02/11/2014, 10:17 AM

## 2014-02-11 NOTE — Progress Notes (Signed)
Physical Therapy Session Note  Patient Details  Name: Travis [name] MRN: [id number] Date of Birth: 02/26/1941  Today's Date: 02/11/2014 PT Individual Time: 0800-0900 and 1335-1420 PT Individual Time Calculation (min): 60 min and 45 min  Short Term Goals: Week 1:  PT Short Term Goal 1 (Week 1): STGs = LTGs  Skilled Therapeutic Interventions/Progress Updates:    AM Session: Patient received semi-reclined in bed. Session focused on functional transfers, gait training, and B knee ROM. Gait training 125' x2 with RW and supervision, verbal cues for upright posture, decreased weight bearing through B UEs, and heel strike. Sitting EOM, heel slides for knee flexion with pillow case under each foot, 10x10". Quad sets with 5" hold to facilitate increased knee flexion. NuStep Level 2 with seat far and close for patient assisted knee ext and flexion. Patient returned to room and left semi-reclined in bed with all needs within reach.  ROM: R knee flex: 89 degrees; R knee ext: lacking 8 degrees; L knee flex: 86 degrees ; L knee ext: lacking 11 degrees  PM Session: Patient received sitting in recliner. Session focused on functional transfers, gait training, stair negotiation, and B knee ROM. Patient with c/o fatigue this PM. Gait training 89' x1 and 42' x1 with RW and supervision, verbal cues for upright posture, decreased weight bearing through B UEs, and heel strike. Stair negotiation x6 steps with B handrails, step to pattern, ascends/descends forwards, verbal cues for appropriate sequencing based on L knee with less flexion ROM than R. NuStep Level 2 with seat far and close for patient assisted knee ext and flexion. Wheelchair mobility x75' with supervision for activity tolerance. Patient returned to room and left semi-reclined in bed with all needs within reach.  Therapy Documentation Precautions:  Precautions Precautions: Fall;Knee Precaution Comments: L KI d/c'd 9/19 Required Braces or Orthoses: Knee  Immobilizer - Left Knee Immobilizer - Left: Discontinue once straight leg raise with < 10 degree lag (L KI d/c'd 9/19) Restrictions Weight Bearing Restrictions: No Pain: Pain Assessment Pain Assessment: 0-10 Pain Score: 6  Pain Type: Surgical pain Pain Location: Knee Pain Orientation: Right;Left Pain Descriptors / Indicators: Aching;Sore Pain Frequency: Intermittent Pain Onset: With Activity Patients Stated Pain Goal: 2 Pain Intervention(s): RN made aware;Repositioned;Ambulation/increased activity Multiple Pain Sites: No Locomotion : Ambulation Ambulation/Gait Assistance: 5: Supervision Wheelchair Mobility Distance: 75   See FIM for current functional status  Therapy/Group: Individual Therapy  Lillia Abed. Pierre Cumpton, PT, DPT 02/11/2014, 2:26 PM

## 2014-02-11 NOTE — Progress Notes (Signed)
[place] PHYSICAL MEDICINE & REHABILITATION     PROGRESS NOTE    Subjective/Complaints: Had a good night. Worked hard in therapy.  Pt requesting a wean of pain meds Constipation , no drowsiness , pain well controlled A  review of systems has been performed and if not noted above is otherwise negative.   Objective: Vital Signs: Blood pressure 132/84, pulse 85, temperature 99.1 F (37.3 C), temperature source Oral, resp. rate 18, weight 91.087 kg (200 lb 13 oz), SpO2 94.00%. No results found.  Recent Labs  02/09/14 0525  WBC 8.0  HGB 10.2*  HCT 28.7*  PLT 147*    Recent Labs  02/09/14 0525  NA 130*  K 3.7  CL 92*  GLUCOSE 121*  BUN 9  CREATININE 0.56  CALCIUM 8.2*   CBG (last 3)  No results found for this basename: GLUCAP,  in the last 72 hours  Wt Readings from Last 3 Encounters:  02/08/14 91.087 kg (200 lb 13 oz)  02/06/14 83.008 kg (183 lb)  02/06/14 83.008 kg (183 lb)    Physical Exam:  Constitutional: He appears well-developed.  HENT: oral mucosa pink and moist  Head: Normocephalic.  Eyes: EOM are normal.  Neck: Neck supple. No thyromegaly present.  Cardiovascular:  Cardiac rate controlled  Respiratory: Effort normal and breath sounds normal. No respiratory distress.  GI: Soft. Bowel sounds are normal. He exhibits no distension.  Neurological: He is alert.  Follows full commands. Oriented to person place. Strength intact. Sensory intact. CN exam normal.  Skin:  Bilateral knee incisions are dressed and appropriately tender. Musc: RK--70deg flexion, LK--80deg, edema left greater than right knee Psych: pleasant and cooperative   Assessment/Plan: 1. Functional deficits secondary to OA of knees, s/p bilateral total knee replacements which require 3+ hours per day of interdisciplinary therapy in a comprehensive inpatient rehab setting. Physiatrist is providing close team supervision and 24 hour management of active medical problems listed  below. Physiatrist and rehab team continue to assess barriers to discharge/monitor patient progress toward functional and medical goals.  PT MAY SHOWER----with current dressings in place---  FIM: FIM - Bathing Bathing Steps Patient Completed: Chest;Right Arm;Left Arm;Abdomen;Front perineal area;Buttocks;Right upper leg;Left upper leg;Right lower leg (including foot);Left lower leg (including foot) Bathing: 5: Set-up assist to: Adjust water temp (grab bar in shower)  FIM - Upper Body Dressing/Undressing Upper body dressing/undressing steps patient completed: Thread/unthread right sleeve of pullover shirt/dresss;Thread/unthread left sleeve of pullover shirt/dress;Put head through opening of pull over shirt/dress;Pull shirt over trunk Upper body dressing/undressing: 5: Set-up assist to: Obtain clothing/put away FIM - Lower Body Dressing/Undressing Lower body dressing/undressing steps patient completed: Pull underwear up/down;Thread/unthread left underwear leg;Thread/unthread left pants leg;Don/Doff right sock;Don/Doff left sock;Thread/unthread right underwear leg;Thread/unthread right pants leg;Pull pants up/down;Fasten/unfasten pants;Don/Doff right shoe Lower body dressing/undressing: 4: Min-Patient completed 75 plus % of tasks  FIM - Toileting Toileting steps completed by patient: Adjust clothing prior to toileting;Performs perineal hygiene;Adjust clothing after toileting Toileting Assistive Devices: Grab bar or rail for support Toileting: 4: Steadying assist  FIM - Radio producer Devices: Elevated toilet seat;Grab bars;Walker (3in1 over commode) Toilet Transfers: 4-To toilet/BSC: Min A (steadying Pt. > 75%);4-From toilet/BSC: Min A (steadying Pt. > 75%)  FIM - Control and instrumentation engineer Devices: Walker;Arm rests Bed/Chair Transfer: 5: Supine > Sit: Supervision (verbal cues/safety issues);4: Sit > Supine: Min A (steadying pt. > 75%/lift 1  leg);4: Bed > Chair or W/C: Min A (steadying Pt. > 75%);4: Chair or W/C >  Bed: Min A (steadying Pt. > 75%)  FIM - Locomotion: Wheelchair Distance: 115 Locomotion: Wheelchair: 2: Travels 44 - 149 ft with supervision, cueing or coaxing FIM - Locomotion: Ambulation Locomotion: Ambulation Assistive Devices: Administrator Ambulation/Gait Assistance: 4: Min guard;5: Supervision Locomotion: Ambulation: 2: Travels 50 - 149 ft with minimal assistance (Pt.>75%)  Comprehension Comprehension Mode: Auditory Comprehension: 7-Follows complex conversation/direction: With no assist  Expression Expression Mode: Verbal Expression: 7-Expresses complex ideas: With no assist  Social Interaction Social Interaction: 7-Interacts appropriately with others - No medications needed.  Problem Solving Problem Solving: 7-Solves complex problems: Recognizes & self-corrects  Memory Memory: 7-Complete Independence: No helper  Medical Problem List and Plan:  1. Functional deficits secondary to bilateral total knee arthroplasty 02/06/2014 for end-stage osteoarthritis  2. DVT Prophylaxis/Anticoagulation: Pradaxa. Vascular study neg 3. Pain Management: Celebrex 200 mg every 12 hours, oxycodone reduced to 5-10mg  q 6h Robaxin as needed.  Ample ice 4. Acute blood loss anemia. Followup CBC. Continue iron supplement for now  5. Neuropsych: This patient is capable of making decisions on his own behalf.  6. Skin/Wound Care: Routine skin checks  7. Chronic atrial fibrillation. Patient has been transitioned from Coumadin to Pradaxa. Cardiac rate control.  8. Hypertension. Norvasc 5 mg daily, Coreg 3.125 mg twice a day, Tikosyn 500 mcg twice a day, Avapro 300 mg daily  9. Systolic congestive heart failure. Monitor for any signs of fluid overload --daily weights 10.BPH. Finasteride 1 mg daily.  11. GERD. Protonix  12. Insomnia. Continue Restoril as prior to admission  LOS (Days) 3 A FACE TO FACE EVALUATION WAS  PERFORMED  Alysia Penna E 02/11/2014 7:20 AM

## 2014-02-11 NOTE — Progress Notes (Signed)
Occupational Therapy Session Note  Patient Details  Name: Travis [name] MRN: [id number] Date of Birth: Feb 17, 1941  Today's Date: 02/11/2014 OT Individual Time: 1300-1330 OT Individual Time Calculation (min): 30 min    Short Term Goals: Week 1:  OT Short Term Goal 1 (Week 1): STGs=LTGs due to estimated short LOS  Skilled Therapeutic Interventions/Progress Updates:   Patient seen this afternoon for occupational therapy with emphasis on functional mobility, postural control, standing balance, standing tolerance, activity tolerance, strength, and overall endurance.  Patient transfers supine to EOB with supervision and dons B shoes using long handled shoe horn with increased time and verbal cues for technique.  Patient ambulates from hospital room to therapy gym approximately 75 feet with increased time, minimum rest breaks, and minimum assistance with use of RW.  Patient completes standing tolerance tasks from mat with sit to/from stand to RW with CGA to minimum assistance, stand balance CGA to SBA, and stand tolerance 2-3 minutes per standing trial while completing 2 handed laundry folding task and strengthening/activity tolerance task to push 3# weighted therapy ball up/down inclined wedge on tabletop.  Patient self propels wheelchair from therapy gym to hospital room with supervision with verbal cues for steering/technique.  Patient transfers wheelchair to recliner chair in hospital room via squat pivot minimum assistance.  Patient seated with BLE elevated and call bell/needs in place.  Patient reports pain level 2/10 at initiation of session and 4/10 at end of session.   Therapy Documentation Precautions:  Precautions Precautions: Fall;Knee Precaution Comments: L KI d/c'd 9/19 Required Braces or Orthoses: Knee Immobilizer - Left Knee Immobilizer - Left: Discontinue once straight leg raise with < 10 degree lag (L KI d/c'd 9/19) Restrictions Weight Bearing Restrictions: No Pain: 2/10 at  initiation of session 4/10 at end of session  See FIM for current functional status  Therapy/Group: Individual Therapy  Osa Craver 02/11/2014, 3:00 PM

## 2014-02-12 ENCOUNTER — Inpatient Hospital Stay (HOSPITAL_COMMUNITY): Payer: Medicare Other | Admitting: *Deleted

## 2014-02-12 MED ORDER — OXYCODONE HCL 5 MG PO TABS
5.0000 mg | ORAL_TABLET | Freq: Four times a day (QID) | ORAL | Status: DC | PRN
  Administered 2014-02-12: 5 mg via ORAL
  Administered 2014-02-12 – 2014-02-14 (×7): 15 mg via ORAL
  Filled 2014-02-12 (×8): qty 3

## 2014-02-12 NOTE — Progress Notes (Signed)
Physical Therapy Session Note  Patient Details  Name: Travis [name] MRN: [id number] Date of Birth: December 14, 1940  Today's Date: 02/12/2014 PT Individual Time: 1000-1044 PT Individual Time Calculation (min): 44 min   Short Term Goals: Week 1:  PT Short Term Goal 1 (Week 1): STGs = LTGs  Skilled Therapeutic Interventions/Progress Updates:    Patient received semi-reclined in bed. Patient with c/o MD weaning pain medication too quickly. Session focused on functional transfers, gait training, and B knee ROM. Gait training 125' x2 with RW and supervision, verbal cues for upright posture, decreased weight bearing through B UEs, and heel strike. NuStep Level 2 with seat far and close for patient assisted knee ext and flexion.  Attempted stair negotiation to simulate STE home (positioned stairs at doorway since patient has no handrails and would like to use door frame as UE assistance). During ambulation from NuStep>stairs, patient stating he doesn't feel well, c/o increased pain, mild dizziness, and reports he "feels clammy." Sitting rest break on mat. Vitals taken, see below. Patient appearing more anxious than usual at this time and requesting to return to room. Min guard for stand pivot transfer mat>wheelchair, transported [place] via wheelchair back to room and transferred wheelchair>bed with min guard. Patient left semi-reclined in bed with all needs within reach, ice applied to B knees.  Education/discussion with patient that this session was his only therapy session today so if he is feeling better later, to call for RN or nurse tech to take short walk with him several times this afternoon. RN and nurse tech aware.  May need to consider pushing d/c date back one day if patient not feeling better tomorrow.  Therapy Documentation Precautions:  Precautions Precautions: Fall;Knee Precaution Comments: L KI d/c'd 9/19 Required Braces or Orthoses: Knee Immobilizer - Left Knee Immobilizer - Left:  Discontinue once straight leg raise with < 10 degree lag (d/c'd 9/19) Restrictions Weight Bearing Restrictions: No Pain: Pain Assessment Pain Assessment: 0-10 Pain Score: 7  Pain Type: Surgical pain Pain Location: Knee Pain Orientation: Right;Left Pain Descriptors / Indicators: Aching;Sore Pain Onset: With Activity Pain Intervention(s): RN made aware;Repositioned;Ambulation/increased activity Multiple Pain Sites: No Locomotion : Ambulation Ambulation/Gait Assistance: 5: Supervision   See FIM for current functional status  Therapy/Group: Individual Therapy  Lillia Abed. Andray Assefa, PT, DPT 02/12/2014, 10:52 AM

## 2014-02-12 NOTE — Progress Notes (Signed)
PHYSICAL MEDICINE & REHABILITATION     PROGRESS NOTE    Subjective/Complaints:  Pt requested a wean of pain meds yest but says it was reduced "too much" Constipation improved A  review of systems has been performed and if not noted above is otherwise negative.   Objective: Vital Signs: Blood pressure 137/65, pulse 77, temperature 97.9 F (36.6 C), temperature source Oral, resp. rate 18, weight 91.087 kg (200 lb 13 oz), SpO2 93.00%. No results found. No results found for this basename: WBC, HGB, HCT, PLT,  in the last 72 hours No results found for this basename: NA, K, CL, CO, GLUCOSE, BUN, CREATININE, CALCIUM,  in the last 72 hours CBG (last 3)  No results found for this basename: GLUCAP,  in the last 72 hours  Wt Readings from Last 3 Encounters:  02/08/14 91.087 kg (200 lb 13 oz)  02/06/14 83.008 kg (183 lb)  02/06/14 83.008 kg (183 lb)    Physical Exam:  Constitutional: He appears well-developed.  HENT: oral mucosa pink and moist  Head: Normocephalic.  Eyes: EOM are normal.  Neck: Neck supple. No thyromegaly present.  Cardiovascular:  Cardiac rate controlled  Respiratory: Effort normal and breath sounds normal. No respiratory distress.  GI: Soft. Bowel sounds are normal. He exhibits no distension.  Neurological: He is alert.  Follows full commands. Oriented to person place. Strength intact. Sensory intact. CN exam normal.  Skin:  Bilateral knee incisions are dressed and appropriately tender. Musc: RK--70deg flexion, LK--80deg, edema left greater than right knee Psych: pleasant and cooperative   Assessment/Plan: 1. Functional deficits secondary to OA of knees, s/p bilateral total knee replacements which require 3+ hours per day of interdisciplinary therapy in a comprehensive inpatient rehab setting. Physiatrist is providing close team supervision and 24 hour management of active medical problems listed below. Physiatrist and rehab team continue to assess  barriers to discharge/monitor patient progress toward functional and medical goals.  PT MAY SHOWER----with current dressings in place---  FIM: FIM - Bathing Bathing Steps Patient Completed: Chest;Right Arm;Left Arm;Abdomen;Front perineal area;Buttocks;Right upper leg;Left upper leg;Right lower leg (including foot);Left lower leg (including foot) Bathing: 5: Supervision: Safety issues/verbal cues  FIM - Upper Body Dressing/Undressing Upper body dressing/undressing steps patient completed: Put head through opening of pull over shirt/dress;Pull shirt over trunk;Thread/unthread left sleeve of pullover shirt/dress;Thread/unthread right sleeve of pullover shirt/dresss Upper body dressing/undressing: 5: Supervision: Safety issues/verbal cues FIM - Lower Body Dressing/Undressing Lower body dressing/undressing steps patient completed: Thread/unthread right underwear leg;Thread/unthread left underwear leg;Pull underwear up/down;Thread/unthread right pants leg;Thread/unthread left pants leg;Pull pants up/down;Fasten/unfasten pants;Don/Doff right shoe;Don/Doff left shoe Lower body dressing/undressing: 4: Steadying Assist  FIM - Toileting Toileting steps completed by patient: Adjust clothing prior to toileting;Performs perineal hygiene;Adjust clothing after toileting Toileting Assistive Devices: Grab bar or rail for support Toileting: 4: Steadying assist  FIM - Radio producer Devices: Elevated toilet seat;Grab bars;Walker Toilet Transfers: 4-To toilet/BSC: Min A (steadying Pt. > 75%);4-From toilet/BSC: Min A (steadying Pt. > 75%)  FIM - Control and instrumentation engineer Devices: Walker;Arm rests;HOB elevated Bed/Chair Transfer: 5: Sit > Supine: Supervision (verbal cues/safety issues);5: Bed > Chair or W/C: Supervision (verbal cues/safety issues);5: Chair or W/C > Bed: Supervision (verbal cues/safety issues)  FIM - Locomotion: Wheelchair Distance:  75 Locomotion: Wheelchair: 2: Travels 50 - 149 ft with supervision, cueing or coaxing FIM - Locomotion: Ambulation Locomotion: Ambulation Assistive Devices: Administrator Ambulation/Gait Assistance: 5: Supervision Locomotion: Ambulation: 2: Travels 50 - 149 ft with  supervision/safety issues  Comprehension Comprehension Mode: Auditory Comprehension: 7-Follows complex conversation/direction: With no assist  Expression Expression Mode: Verbal Expression: 7-Expresses complex ideas: With no assist  Social Interaction Social Interaction: 7-Interacts appropriately with others - No medications needed.  Problem Solving Problem Solving: 7-Solves complex problems: Recognizes & self-corrects  Memory Memory: 7-Complete Independence: No helper  Medical Problem List and Plan:  1. Functional deficits secondary to bilateral total knee arthroplasty 02/06/2014 for end-stage osteoarthritis  2. DVT Prophylaxis/Anticoagulation: Pradaxa. Vascular study neg 3. Pain Management: Celebrex 200 mg every 12 hours, oxycodone reduced to 5-10mg  q 6h at pt request but then wanted to go back up to 15mg  again Robaxin as needed.  Ample ice 4. Acute blood loss anemia. Followup CBC. Continue iron supplement for now  5. Neuropsych: This patient is capable of making decisions on his own behalf.  6. Skin/Wound Care: Routine skin checks  7. Chronic atrial fibrillation. Patient has been transitioned from Coumadin to Pradaxa. Cardiac rate control.  8. Hypertension. Norvasc 5 mg daily, Coreg 3.125 mg twice a day, Tikosyn 500 mcg twice a day, Avapro 300 mg daily  9. Systolic congestive heart failure. Monitor for any signs of fluid overload --daily weights 10.BPH. Finasteride 1 mg daily.  11. GERD. Protonix  12. Insomnia. Continue Restoril as prior to admission  LOS (Days) 4 A FACE TO FACE EVALUATION WAS PERFORMED  Arlind Klingerman E 02/12/2014 7:28 AM

## 2014-02-12 NOTE — Progress Notes (Signed)
Restoril given at 2036 per patient's request. 10mg  of Oxy IR given at [place], for complaint of bilateral knee pain. BLE edema. Knee dressings C,D, & I . Cornell Barman

## 2014-02-13 ENCOUNTER — Encounter (HOSPITAL_COMMUNITY): Payer: Medicare Other

## 2014-02-13 ENCOUNTER — Inpatient Hospital Stay (HOSPITAL_COMMUNITY): Payer: Medicare Other | Admitting: *Deleted

## 2014-02-13 ENCOUNTER — Inpatient Hospital Stay (HOSPITAL_COMMUNITY): Payer: Medicare Other

## 2014-02-13 NOTE — Progress Notes (Signed)
Occupational Therapy Session Note  Patient Details  Name: Travis [name] MRN: [id number] Date of Birth: 1940/12/27  Today's Date: 02/13/2014 OT Individual Time: 0915-1000 OT Individual Time Calculation (min): 45 min    Short Term Goals: Week 1:  OT Short Term Goal 1 (Week 1): STGs=LTGs due to estimated short LOS  Skilled Therapeutic Interventions/Progress Updates:    Pt completed bathing at shower level and dressing with sit<>stand from chair.  Pt amb with RW to bathroom to use toilet prior to transferring to walk in shower.  Pt gathered supplies and clothing prior to bathing and dressing tasks.  Pt retrieved items from floor using reacher at end of session.  Pt used AE appropriately throughout session.  Pt completed all tasks at mod I level.  Discussed discharge tomorrow and patient states that he has a tub bench and BSC.  Focus on safety awareness, activity tolerance, functional amb with RW, and discharge planning.  Therapy Documentation Precautions:  Precautions Precautions: Fall;Knee Precaution Comments: L KI d/c'd 9/19 Required Braces or Orthoses: Knee Immobilizer - Left Knee Immobilizer - Left: Discontinue once straight leg raise with < 10 degree lag (d/c'd 9/19) Restrictions Weight Bearing Restrictions: No   Pain: Pain Assessment Pain Assessment: No/denies pain Pain Score: 0-No pain See FIM for current functional status  Therapy/Group: Individual Therapy  [name] 02/13/2014, 10:04 AM

## 2014-02-13 NOTE — Progress Notes (Signed)
[facility] PHYSICAL MEDICINE & REHABILITATION     PROGRESS NOTE    Subjective/Complaints:  Pain better this morning. Still some swelling in legs   A  review of systems has been performed and if not noted above is otherwise negative.   Objective: Vital Signs: Blood pressure 151/78, pulse 78, temperature 98.7 F (37.1 C), temperature source Oral, resp. rate 16, weight 83.5 kg (184 lb 1.4 oz), SpO2 92.00%. No results found. No results found for this basename: WBC, HGB, HCT, PLT,  in the last 72 hours No results found for this basename: NA, K, CL, CO, GLUCOSE, BUN, CREATININE, CALCIUM,  in the last 72 hours CBG (last 3)  No results found for this basename: GLUCAP,  in the last 72 hours  Wt Readings from Last 3 Encounters:  02/13/14 83.5 kg (184 lb 1.4 oz)  02/06/14 83.008 kg (183 lb)  02/06/14 83.008 kg (183 lb)    Physical Exam:  Constitutional: He appears well-developed.  HENT: oral mucosa pink and moist  Head: Normocephalic.  Eyes: EOM are normal.  Neck: Neck supple. No thyromegaly present.  Cardiovascular:  Cardiac rate controlled  Respiratory: Effort normal and breath sounds normal. No respiratory distress.  GI: Soft. Bowel sounds are normal. He exhibits no distension.  Neurological: He is alert.  Follows full commands. Oriented to person place.  Leg incisions clean and appropriately tender. Musc: RK--80deg flexion, LK--85deg, edema left greater than right knee Psych: pleasant and cooperative   Assessment/Plan: 1. Functional deficits secondary to OA of knees, s/p bilateral total knee replacements which require 3+ hours per day of interdisciplinary therapy in a comprehensive inpatient rehab setting. Physiatrist is providing close team supervision and 24 hour management of active medical problems listed below. Physiatrist and rehab team continue to assess barriers to discharge/monitor patient progress toward functional and medical goals.  PT MAY SHOWER----with  current dressings in place---  FIM: FIM - Bathing Bathing Steps Patient Completed: Chest;Right Arm;Left Arm;Abdomen;Front perineal area;Buttocks;Right upper leg;Left upper leg;Right lower leg (including foot);Left lower leg (including foot) Bathing: 5: Supervision: Safety issues/verbal cues  FIM - Upper Body Dressing/Undressing Upper body dressing/undressing steps patient completed: Put head through opening of pull over shirt/dress;Pull shirt over trunk;Thread/unthread left sleeve of pullover shirt/dress;Thread/unthread right sleeve of pullover shirt/dresss Upper body dressing/undressing: 5: Supervision: Safety issues/verbal cues FIM - Lower Body Dressing/Undressing Lower body dressing/undressing steps patient completed: Thread/unthread right underwear leg;Thread/unthread left underwear leg;Pull underwear up/down;Thread/unthread right pants leg;Thread/unthread left pants leg;Pull pants up/down;Fasten/unfasten pants;Don/Doff right shoe;Don/Doff left shoe Lower body dressing/undressing: 4: Steadying Assist  FIM - Toileting Toileting steps completed by patient: Adjust clothing prior to toileting;Performs perineal hygiene;Adjust clothing after toileting Toileting Assistive Devices: Grab bar or rail for support Toileting: 4: Steadying assist  FIM - Radio producer Devices: Elevated toilet seat;Grab bars;Walker Toilet Transfers: 4-To toilet/BSC: Min A (steadying Pt. > 75%);4-From toilet/BSC: Min A (steadying Pt. > 75%)  FIM - Control and instrumentation engineer Devices: Walker;Arm rests;HOB elevated Bed/Chair Transfer: 5: Supine > Sit: Supervision (verbal cues/safety issues);5: Sit > Supine: Supervision (verbal cues/safety issues);4: Bed > Chair or W/C: Min A (steadying Pt. > 75%);4: Chair or W/C > Bed: Min A (steadying Pt. > 75%)  FIM - Locomotion: Wheelchair Distance: 75 Locomotion: Wheelchair: 1: Total Assistance/staff pushes wheelchair (Pt<25%) FIM  - Locomotion: Ambulation Locomotion: Ambulation Assistive Devices: Administrator Ambulation/Gait Assistance: 5: Supervision Locomotion: Ambulation: 2: Travels 50 - 149 ft with supervision/safety issues  Comprehension Comprehension Mode: Auditory Comprehension: 7-Follows complex conversation/direction:  With no assist  Expression Expression Mode: Verbal Expression: 7-Expresses complex ideas: With no assist  Social Interaction Social Interaction: 7-Interacts appropriately with others - No medications needed.  Problem Solving Problem Solving: 7-Solves complex problems: Recognizes & self-corrects  Memory Memory: 7-Complete Independence: No helper  Medical Problem List and Plan:  1. Functional deficits secondary to bilateral total knee arthroplasty 02/06/2014 for end-stage osteoarthritis  2. DVT Prophylaxis/Anticoagulation: Pradaxa. Vascular study neg 3. Pain Management: Celebrex 200 mg every 12 hours, oxycodone 5-15mg  q16 prn for pain relief. Ice prn 4. Acute blood loss anemia. Followup CBC. Continue iron supplement for now  5. Neuropsych: This patient is capable of making decisions on his own behalf.  6. Skin/Wound Care: Routine skin checks  7. Chronic atrial fibrillation. Patient has been transitioned from Coumadin to Pradaxa. Cardiac rate control.  8. Hypertension. Norvasc 5 mg daily, Coreg 3.125 mg twice a day, Tikosyn 500 mcg twice a day, Avapro 300 mg daily  9. Systolic congestive heart failure. Monitor for any signs of fluid overload --daily weights 10.BPH. Finasteride 1 mg daily.  11. GERD. Protonix  12. Insomnia. Continue Restoril as prior to admission  LOS (Days) 5 A FACE TO FACE EVALUATION WAS PERFORMED  Travis [name] 02/13/2014 8:00 AM

## 2014-02-13 NOTE — Progress Notes (Signed)
Declined scheduled laxatives last night, not needed per patient. Patient concerned about swelling in legs. Encouraged patient to start back using ice to knees, agreeable. Ice applied X 2 thus far. Also spoke with patient about trying PRN robaxin, if Oxy IR isn't due. Gave dose at 2021, reports med helped out. Takes PRN Oxy IR 15mg , regularly every 6 hours. Patrici Ranks A

## 2014-02-13 NOTE — Progress Notes (Signed)
Physical Therapy Discharge Summary  Patient Details  Name: CASE VASSELL MRN: [id number] Date of Birth: [birth year]  Today's Date: 02/13/2014 PT Individual Time: 1345-1500 PT Individual Time Calculation (min): 75 min    Patient has met 9 of 9 long term goals due to improved activity tolerance, improved balance, increased strength, increased range of motion, decreased pain, ability to compensate for deficits, functional use of  right lower extremity and left lower extremity and improved coordination.  Patient to discharge at an ambulatory level Modified Independent.   Patient's care partner not necessary secondary to patient discharging at mod I level to provide the necessary assistance at discharge. Patient aware that he requires supervision/set up assistance for stair negotiation and car transfer secondary to management of RW.  Reasons goals not met: N/A, all LTGs met.  Recommendation:  Patient will benefit from ongoing skilled PT services in outpatient setting to continue to advance safe functional mobility, address ongoing impairments in strength, ROM, balance, gait, overall functional mobility, and minimize fall risk.  Equipment: RW  Reasons for discharge: treatment goals met and discharge from hospital  Patient/family agrees with progress made and goals achieved: Yes  Skilled Interventions: Session today focused on all functional mobility in preparation for d/c home tomorrow, 02/13/14. Patient performed furniture transfers from low, cushioned sofa with mod I. Initiated and reviewed HEP, handout provided to patient. Patient made mod I in room and educated on taking short walks in hallways, RN aware.  PT Discharge Precautions/Restrictions Precautions Precautions: Knee (mod I) Precaution Comments: L KI d/c'd 9/19 Required Braces or Orthoses: Knee Immobilizer - Left Knee Immobilizer - Left: Discontinue once straight leg raise with < 10 degree lag (d/c'd 9/19) Restrictions Weight  Bearing Restrictions: No Pain Pain Assessment Pain Assessment: 0-10 Pain Score: 5  Pain Type: Surgical pain Pain Location: Knee Pain Orientation: Right;Left Pain Descriptors / Indicators: Aching;Sore Pain Frequency: Intermittent Pain Onset: With Activity Patients Stated Pain Goal: 3 (Medicating prior to therapy) Pain Intervention(s): RN made aware;Repositioned;Ambulation/increased activity Multiple Pain Sites: No Vision/Perception    See OT note. Cognition Overall Cognitive Status: Within Functional Limits for tasks assessed Orientation Level: Oriented X4 Memory: Appears intact Awareness: Appears intact Safety/Judgment: Appears intact Sensation Sensation Light Touch: Appears Intact Stereognosis: Appears Intact Hot/Cold: Appears Intact Proprioception: Appears Intact Coordination Gross Motor Movements are Fluid and Coordinated: Yes Fine Motor Movements are Fluid and Coordinated: Yes Motor  Motor Motor: Within Functional Limits Motor - Discharge Observations: [facility] for s/p B TKAs  Mobility Bed Mobility Bed Mobility: Sit to Supine;Supine to Sit Supine to Sit: 6: Modified independent (Device/Increase time);HOB flat Sit to Supine: 6: Modified independent (Device/Increase time);HOB flat Transfers Transfers: Yes Sit to Stand: 6: Modified independent (Device/Increase time);With armrests;With upper extremity assist;From chair/3-in-1;From bed Stand to Sit: 6: Modified independent (Device/Increase time);With upper extremity assist;With armrests;To bed;To chair/3-in-1 Stand Pivot Transfers: 6: Modified independent (Device/Increase time);With armrests (with RW) Locomotion  Ambulation Ambulation: Yes Ambulation/Gait Assistance: 6: Modified independent (Device/Increase time) Ambulation Distance (Feet): 200 Feet Assistive device: Rolling walker Ambulation/Gait Assistance Details: Gait training performed in controlled (175'), home (25'), and community environments (200') with RW and  mod I;  Gait Gait: Yes Gait Pattern: Impaired Gait Pattern: Decreased step length - right;Decreased hip/knee flexion - right;Decreased hip/knee flexion - left;Antalgic;Trunk flexed;Step-through pattern;Decreased stride length;Decreased step length - left Stairs / Additional Locomotion Stairs: Yes Stairs Assistance: 5: Supervision Stairs Assistance Details (indicate cue type and reason): Simulated STE home by placing stairs in doorway, so patient uses door frame as  UE support. Ascends forwards, descends backwards so as to keep CoG over BOS with B UEs on door frame Stair Management Technique: Forwards;No rails;Backwards Number of Stairs: 4 Height of Stairs: 6 Wheelchair Mobility Wheelchair Mobility: No (patient is ambulatory) Distance: 150  Trunk/Postural Assessment  Cervical Assessment Cervical Assessment: Within Functional Limits Thoracic Assessment Thoracic Assessment: Within Functional Limits Lumbar Assessment Lumbar Assessment: Within Functional Limits Postural Control Postural Control: Within Functional Limits  Balance Balance Balance Assessed: Yes Static Sitting Balance Static Sitting - Balance Support: Feet supported;No upper extremity supported Static Sitting - Level of Assistance: 7: Independent Static Standing Balance Static Standing - Balance Support: Bilateral upper extremity supported;During functional activity Static Standing - Level of Assistance: 6: Modified independent (Device/Increase time) Extremity Assessment  RUE Assessment RUE Assessment: Within Functional Limits LUE Assessment LUE Assessment: Within Functional Limits RLE Assessment RLE Assessment: Exceptions to [facility] RLE AROM (degrees) Overall AROM Right Lower Extremity: Deficits;Due to pain;Other (Comment);Due to decreased strength (s/p TKA) Right Knee Extension: 6 (lacking 6 degrees extension) Right Knee Flexion: 91 RLE Strength RLE Overall Strength: Deficits;Due to pain;Other (Comment) (s/p  TKA) RLE Overall Strength Comments: Grossly 3/5 LLE Assessment LLE Assessment: Exceptions to WFL LLE AROM (degrees) Overall AROM Left Lower Extremity: Deficits;Due to decreased strength;Due to pain;Other (Comment) (s/p TKA) Left Knee Extension: 9 (lacking 9 degrees extension) Left Knee Flexion: 87 LLE Strength LLE Overall Strength: Deficits;Due to pain;Other (Comment) (s/p TKA) LLE Overall Strength Comments: Grossly 3/5  See FIM for current functional status  Bridget S Flint Melter. Ripa, PT, DPT 02/13/2014, 3:13 PM

## 2014-02-13 NOTE — Discharge Instructions (Signed)
Inpatient Rehab Discharge Instructions  Travis [name] Discharge date and time: No discharge date for patient encounter.   Activities/Precautions/ Functional Status: Activity: activity as tolerated Diet: regular diet Wound Care: keep wound clean and dry Functional status:  ___ No restrictions     ___ Walk up steps independently ___ 24/7 supervision/assistance   ___ Walk up steps with assistance ___ Intermittent supervision/assistance  ___ Bathe/dress independently ___ Walk with walker     ___ Bathe/dress with assistance ___ Walk Independently    ___ Shower independently _x__ Walk with assistance    ___ Shower with assistance ___ No alcohol     ___ Return to work/school ________   COMMUNITY REFERRALS UPON DISCHARGE:    Outpatient: PT                  Agency: [employer] [facility] [employer],  [address]) w/ Ericka Pontiff, PT    Phone: [phone]              Appointment Date/Time:  02/16/14 @ 9:00 am (arrive approx 10 mins prior to appointment)  Medical Equipment/Items Ordered: rolling walker                                                     Agency/Supplier: [facility] @ [phone]      Special Instructions:    My questions have been answered and I understand these instructions. I will adhere to these goals and the provided educational materials after my discharge from the hospital.  Patient/Caregiver Signature _______________________________ Date __________  Clinician Signature _______________________________________ Date __________  Please bring this form and your medication list with you to all your follow-up doctor's appointments.

## 2014-02-13 NOTE — Progress Notes (Signed)
Social Work Patient ID: Travis [name], male   DOB: [birth year], 73 y.o.   MRN: [id number]  Therapists and MD/ PA agreed today that pt ready for d/c tomorrow. Pt very pleased with progress and also agrees with plan.   Pt also requests that his follow up therapy be arranged at Dr. Aurea Graff office - referral has been placed.  No further concerns.  Katherine Tout, LCSW

## 2014-02-13 NOTE — Progress Notes (Signed)
Occupational Therapy Discharge Summary  Patient Details  Name: Travis [name] MRN: [id number] Date of Birth: Dec 07, 1940     Patient has met 10 of 10 long term goals due to improved activity tolerance, improved balance, increased strength, increased range of motion, decreased pain, ability to compensate for deficits, functional use of right lower extremity and left lower extremity and improved coordination.  Pt made excellent progress this admission and is mod I for BADLs and IADLs including simple meal prep and home mgmt tasks.  Patient to discharge at overall Modified Independent at a RW level and supervision for shower transfer.  Patient is independent guiding caregiver when supervision is needed.   Recommendation: No follow-up indicated at discharge.  Equipment: No equipment provided Pt has tub bench and BSC  Reasons for discharge: treatment goals met and discharge from hospital  Patient/family agrees with progress made and goals achieved: Yes  OT Discharge Pain Pain Assessment Pain Assessment: 0-10 Pain Score: 5  Pain Location: Knee Pain Orientation: Right;Left Vision/Perception  Vision- History Baseline Vision/History: Wears glasses Wears Glasses: Reading only Patient Visual Report: No change from baseline Vision- Assessment Vision Assessment?: No apparent visual deficits  Cognition Overall Cognitive Status: Within Functional Limits for tasks assessed Orientation Level: Oriented X4 Memory: Appears intact Awareness: Appears intact Safety/Judgment: Appears intact Sensation Sensation Light Touch: Appears Intact Stereognosis: Appears Intact Hot/Cold: Appears Intact Proprioception: Appears Intact Coordination Gross Motor Movements are Fluid and Coordinated: Yes Fine Motor Movements are Fluid and Coordinated: Yes Motor  Motor Motor: Within Functional Limits Trunk/Postural Assessment  Cervical Assessment Cervical Assessment: Within Functional Limits Thoracic  Assessment Thoracic Assessment: Within Functional Limits Lumbar Assessment Lumbar Assessment: Within Functional Limits Postural Control Postural Control: Within Functional Limits  Balance Static Sitting Balance Static Sitting - Balance Support: Feet supported Static Sitting - Level of Assistance: 7: Independent Extremity/Trunk Assessment RUE Assessment RUE Assessment: Within Functional Limits LUE Assessment LUE Assessment: Within Functional Limits  See FIM for current functional status  [name] 02/13/2014, 12:14 PM

## 2014-02-13 NOTE — Progress Notes (Signed)
Physical Therapy Session Note  Patient Details  Name: Travis [name] MRN: [id number] Date of Birth: [birth year]  Today's Date: 02/13/2014 PT Individual Time: 0755-0855 PT Individual Time Calculation (min): 60 min   Short Term Goals: Week 1:  PT Short Term Goal 1 (Week 1): STGs = LTGs  Skilled Therapeutic Interventions/Progress Updates:  Patient donning shoes sitting edge of bed upon entering room using shoehorn. Patient sit to stand and performed stand pivot transfer with walker bed to wheelchair with supervision. Patient stands with bilateral knee flexed. Patient propelled wheelchair 150 feet using UE's with supervision. Patient utilized Nustep on level 3 for 5 minutes with seat distant to promote terminal extension of knees and 5 minutes with seat closer to promote knee flexion. Patient's knees ROM measured at right -5 to 92 degrees and left -17 to 90 degrees. Patient ambulated up and down 2 steps without rails and using door jam to simulate home environment with minimal steady assist. Patient ambulated 175 feet with rolling walker and supervision. Patient cued to get knees as straight as possible. Patient performed LAQ's x 10 to work on Forensic scientist. Patient left in recliner with ice on knees and all items in reach.  Therapy Documentation Precautions:  Precautions Precautions: Fall;Knee Restrictions Weight Bearing Restrictions: No Pain: Pain Assessment Pain Assessment: 0-10 Pain Score: 5  Pain Location: Knee Pain Orientation: Right;Left Locomotion : Ambulation Ambulation/Gait Assistance: 5: Supervision Wheelchair Mobility Distance: 150   See FIM for current functional status  Therapy/Group: Individual Therapy  Elder Love M 02/13/2014, 12:04 PM

## 2014-02-13 NOTE — Discharge Summary (Signed)
Discharge summary job # [id number]

## 2014-02-14 MED ORDER — TELMISARTAN 80 MG PO TABS: 80.0000 mg | ORAL_TABLET | Freq: Every day | ORAL | Status: DC

## 2014-02-14 MED ORDER — METHOCARBAMOL 500 MG PO TABS: 500.0000 mg | ORAL_TABLET | Freq: Four times a day (QID) | ORAL | Status: DC | PRN

## 2014-02-14 MED ORDER — CARVEDILOL 3.125 MG PO TABS: 3.1250 mg | ORAL_TABLET | Freq: Two times a day (BID) | ORAL | Status: DC

## 2014-02-14 MED ORDER — AMLODIPINE BESYLATE 5 MG PO TABS: 5.0000 mg | ORAL_TABLET | Freq: Every morning | ORAL | Status: DC

## 2014-02-14 MED ORDER — FERROUS SULFATE 325 (65 FE) MG PO TABS: 325.0000 mg | ORAL_TABLET | Freq: Three times a day (TID) | ORAL | Status: DC

## 2014-02-14 MED ORDER — OXYCODONE HCL 5 MG PO TABS: 5.0000 mg | ORAL_TABLET | ORAL | Status: DC | PRN

## 2014-02-14 MED ORDER — DABIGATRAN ETEXILATE MESYLATE 150 MG PO CAPS: 150.0000 mg | ORAL_CAPSULE | Freq: Two times a day (BID) | ORAL | Status: DC

## 2014-02-14 MED ORDER — OMEPRAZOLE 20 MG PO CPDR: 20.0000 mg | DELAYED_RELEASE_CAPSULE | Freq: Every day | ORAL | Status: DC

## 2014-02-14 MED ORDER — DOFETILIDE 500 MCG PO CAPS: 500.0000 ug | ORAL_CAPSULE | Freq: Two times a day (BID) | ORAL | Status: DC

## 2014-02-14 MED ORDER — TEMAZEPAM 15 MG PO CAPS: 15.0000 mg | ORAL_CAPSULE | Freq: Every day | ORAL | Status: DC

## 2014-02-14 NOTE — Progress Notes (Signed)
Patient given discharge instructions and prescriptions from [place], [place]; all questions answered.  Patient's walker was delivered to the room.  Patient escorted via wheelchair by [place], NT to brother's vehicle.  Patient discharged home with brother.

## 2014-02-14 NOTE — Discharge Summary (Signed)
NAMEMarland [name]  [place], [place] NO.:  [id number]  MEDICAL RECORD NO.:  [id number]  LOCATION:  4M02C                        FACILITY:  [facility]  PHYSICIAN:  Meredith Staggers, M.D.DATE OF BIRTH:  April 21, 1941  DATE OF ADMISSION:  02/08/2014 DATE OF DISCHARGE:                              DISCHARGE SUMMARY   DISCHARGE DIAGNOSES: 1. Functional deficits secondary to bilateral total knee arthroplasty,     February 06, 2014, for end-stage osteoarthritis. 2. Pradaxa for history of atrial fibrillation. 3. Pain management. 4. Acute blood loss anemia. 5. Chronic atrial fibrillation. 6. Hypertension. 7. Systolic congestive heart failure. 8. Benign prostatic hypertrophy. 9. Gastroesophageal reflux disease. 10.Insomnia.  HISTORY OF PRESENT ILLNESS:  This is a 73 year old right-handed male, history of PAF, maintained on chronic Coumadin therapy, bicuspid aortic valve, systolic congestive heart failure.  Admitted February 06, 2014, with end-stage osteoarthritis, bilateral knees and no relief with conservative care.  The patient independent prior to admission with assistance from his brother.  Onset of symptoms have been gradual over last 3 years.  Underwent bilateral total knee replacement, February 06, 2014, per Dr. Alvan Dame.  Postoperative pain management.  Weightbearing as tolerated.  The patient's Coumadin was transitioned to Pradaxa per Cardiology Services during his hospital stay for chronic atrial fibrillation.  Cardiac rate remained controlled.  Acute blood loss anemia, 10.9 and monitored.  Physical and occupational therapy ongoing. The patient was admitted for comprehensive rehab program.  PAST MEDICAL HISTORY:  See discharge diagnoses.  SOCIAL HISTORY:  Lives with his brother.  Functional history prior to admission, independent.  Functional status upon admission to rehab services was minimal assist, supine to sit; moderate assist, sit to supine; ambulating, min assist, 50  feet rolling walker; min to mod assist, activities of daily living.  PHYSICAL EXAMINATION:  VITAL SIGNS:  Blood pressure 136/73, pulse 74, temperature 97, respirations 16. GENERAL:  This was an alert male, in no acute distress, oriented x3. LUNGS:  Clear to auscultation. CARDIAC:  Regular rate and rhythm. ABDOMEN:  Soft, nontender.  Good bowel sounds. EXTREMITIES:  Bilateral knee incisions clean and dry.  REHABILITATION HOSPITAL COURSE:  The patient was admitted to inpatient rehab services with therapies initiated on a 3-hour daily basis consisting of physical therapy, occupational therapy, and 24-hour rehabilitation nursing.  The following issues were addressed during the patient's rehabilitation stay.  Pertaining to [name] functional deficits secondary to bilateral total knee arthroplasty, February 06, 2014.  Neurovascular sensation intact.  Weightbearing as tolerated.  He would follow up with orthopedic service, Dr. Alvan Dame.  The patient remained on Pradaxa per Cardiology Services, transitioned from Coumadin during his hospital stay per Cardiology Services.  Cardiac rate remained controlled.  He did receive vascular studies, lower extremities during his hospital stay.  Negative for DVT.  Pain management with the use of oxycodone as well as Robaxin for pain with good results.  Acute blood loss anemia, maintained on iron supplement.  Blood pressures remained well controlled, no orthostatic changes.  He exhibited no signs of fluid overload.  He was voiding without difficulty, with noted history of benign prostatic hypertrophy.  The patient received weekly collaborative interdisciplinary team conferences to discuss estimated length  of stay, family teaching, and any barriers to discharge.  He was ambulating 125 feet x2 with rolling walker and supervision.  Close supervision for stairs, transfer supine to edge of bed with supervision, complete standing tolerance task with sit to  stand, contact guard assist.  Full family teaching was completed with his brother and plan discharge to home.  DISCHARGE MEDICATIONS:  Included: 1. Norvasc 5 mg p.o. daily. 2. Coreg 3.125 mg p.o. b.i.d. 3. Pradaxa 150 mg p.o. every 12 hours. 4. Tikosyn 500 mcg p.o. b.i.d. 5. Ferrous sulfate 325 mg p.o. t.i.d. 6. Avapro 300 mg p.o. at bedtime. 7. Robaxin 500 mg p.o. every 6 as needed muscle spasms, dispense of 90     tablets. 8. Oxycodone immediate release 5-15 mg p.o. every 6 hours as needed     severe pain, dispense of 90 tablets. 9. Protonix 40 mg p.o. daily. 10.MiraLAX 17 g p.o. b.i.d., hold for loose stool. 11.Mylicon 80 mg p.o. every 4 hours as needed gas.  DIET:  Regular.  SPECIAL INSTRUCTIONS:  The patient should follow up with Dr. Alger Simons at the outpatient rehab service office as needed.  Dr. Paralee Cancel, Orthopedic Services, 2 weeks, call for appointment.  Dr. Benay Pillow, medical management.  Home health physical and occupational therapy arranged.     Lauraine Rinne, P.A.   ______________________________ Meredith Staggers, M.D.    DA/MEDQ  D:  02/13/2014  T:  02/14/2014  Job:  [id number]  cc:   Pietro Cassis Alvan Dame, M.D. Ricard Dillon, MD

## 2014-02-14 NOTE — Progress Notes (Signed)
Social Work  Discharge Note  The overall goal for the admission was met for:   Discharge location: Yes - return to his own home with brother to stay a "few days"  Length of Stay: Yes - 6 days  Discharge activity level: Yes - modified independent  Home/community participation: Yes  Services provided included: MD, RD, PT, OT, RN, Pharmacy and SW  Financial Services: Medicare and Private Insurance: [employer]  Follow-up services arranged: Outpatient: PT @ [facility], DME: rolling walker via [facility] and Patient/Family has no preference for HH/DME agencies  Comments (or additional information):  Patient/Family verbalized understanding of follow-up arrangements: Yes  Individual responsible for coordination of the follow-up plan: patient  Confirmed correct DME delivered: Travis [name] 02/14/2014    Travis [name]

## 2014-02-14 NOTE — Progress Notes (Signed)
[facility] PHYSICAL MEDICINE & REHABILITATION     PROGRESS NOTE    Subjective/Complaints:  Had a good day. Mod I in the room.   A  review of systems has been performed and if not noted above is otherwise negative.   Objective: Vital Signs: Blood pressure 153/72, pulse 80, temperature 98.6 F (37 C), temperature source Oral, resp. rate 17, weight 83.5 kg (184 lb 1.4 oz), SpO2 98.00%. No results found. No results found for this basename: WBC, HGB, HCT, PLT,  in the last 72 hours No results found for this basename: NA, K, CL, CO, GLUCOSE, BUN, CREATININE, CALCIUM,  in the last 72 hours CBG (last 3)  No results found for this basename: GLUCAP,  in the last 72 hours  Wt Readings from Last 3 Encounters:  02/13/14 83.5 kg (184 lb 1.4 oz)  02/06/14 83.008 kg (183 lb)  02/06/14 83.008 kg (183 lb)    Physical Exam:  Constitutional: He appears well-developed.  HENT: oral mucosa pink and moist  Head: Normocephalic.  Eyes: EOM are normal.  Neck: Neck supple. No thyromegaly present.  Cardiovascular:  Cardiac rate controlled  Respiratory: Effort normal and breath sounds normal. No respiratory distress.  GI: Soft. Bowel sounds are normal. He exhibits no distension.  Neurological: He is alert.  Follows full commands. Oriented to person place.  Leg incisions clean and dry. Musc: RK--80deg flexion, LK--85deg, edema left greater than right knee Psych: pleasant and cooperative   Assessment/Plan: 1. Functional deficits secondary to OA of knees, s/p bilateral total knee replacements which require 3+ hours per day of interdisciplinary therapy in a comprehensive inpatient rehab setting. Physiatrist is providing close team supervision and 24 hour management of active medical problems listed below. Physiatrist and rehab team continue to assess barriers to discharge/monitor patient progress toward functional and medical goals.  Dc home. outpt follow up therapy. See ortho in 1-2  weeks  FIM: FIM - Bathing Bathing Steps Patient Completed: Chest;Right Arm;Left Arm;Abdomen;Front perineal area;Buttocks;Right upper leg;Left upper leg;Right lower leg (including foot);Left lower leg (including foot) Bathing: 6: Assistive device (Comment)  FIM - Upper Body Dressing/Undressing Upper body dressing/undressing steps patient completed: Put head through opening of pull over shirt/dress;Pull shirt over trunk;Thread/unthread left sleeve of pullover shirt/dress;Thread/unthread right sleeve of pullover shirt/dresss Upper body dressing/undressing: 7: Complete Independence: No helper FIM - Lower Body Dressing/Undressing Lower body dressing/undressing steps patient completed: Thread/unthread right underwear leg;Thread/unthread left underwear leg;Pull underwear up/down;Thread/unthread right pants leg;Thread/unthread left pants leg;Pull pants up/down;Fasten/unfasten pants;Don/Doff right shoe;Don/Doff left shoe;Fasten/unfasten right shoe;Fasten/unfasten left shoe;Don/Doff right sock;Don/Doff left sock Lower body dressing/undressing: 6: Assistive device (Comment)  FIM - Toileting Toileting steps completed by patient: Adjust clothing prior to toileting;Performs perineal hygiene;Adjust clothing after toileting Toileting Assistive Devices: Grab bar or rail for support Toileting: 6: Assistive device: No helper  FIM - Radio producer Devices: Elevated toilet seat;Grab bars;Walker Toilet Transfers: 6-Assistive device: No helper;6-To toilet/ BSC;6-From toilet/BSC  FIM - Engineer, site: Environmental consultant;Arm rests Bed/Chair Transfer: 6: Assistive device: no helper;6: More than reasonable amt of time;6: Supine > Sit: No assist;6: Sit > Supine: No assist;6: Bed > Chair or W/C: No assist;6: Chair or W/C > Bed: No assist  FIM - Locomotion: Wheelchair Distance: 150 Locomotion: Wheelchair: 0: Activity did not occur (patient is ambulatory) FIM  - Locomotion: Ambulation Locomotion: Ambulation Assistive Devices: Administrator Ambulation/Gait Assistance: 6: Modified independent (Device/Increase time) Locomotion: Ambulation: 6: Travels 150 ft or more with assistive device/no helper  Comprehension Comprehension Mode: Auditory Comprehension: 7-Follows complex conversation/direction: With no assist  Expression Expression Mode: Verbal Expression: 7-Expresses complex ideas: With no assist  Social Interaction Social Interaction: 7-Interacts appropriately with others - No medications needed.  Problem Solving Problem Solving: 7-Solves complex problems: Recognizes & self-corrects  Memory Memory: 7-Complete Independence: No helper  Medical Problem List and Plan:  1. Functional deficits secondary to bilateral total knee arthroplasty 02/06/2014 for end-stage osteoarthritis  2. DVT Prophylaxis/Anticoagulation: Pradaxa. Vascular study neg 3. Pain Management: Celebrex 200 mg every 12 hours, oxycodone 5-15mg  q16 prn for pain relief. Ice prn 4. Acute blood loss anemia. Followup CBC. Continue iron supplement for now  5. Neuropsych: This patient is capable of making decisions on his own behalf.  6. Skin/Wound Care: Routine skin checks  7. Chronic atrial fibrillation. Patient has been transitioned from Coumadin to Pradaxa. Cardiac rate control.  8. Hypertension. Norvasc 5 mg daily, Coreg 3.125 mg twice a day, Tikosyn 500 mcg twice a day, Avapro 300 mg daily  9. Systolic congestive heart failure. Monitor for any signs of fluid overload --daily weights 10.BPH. Finasteride 1 mg daily.  11. GERD. Protonix  12. Insomnia. Continue Restoril as prior to admission  LOS (Days) 6 A FACE TO FACE EVALUATION WAS PERFORMED  Tanijah Morais T 02/14/2014 7:15 AM

## 2014-02-14 NOTE — Progress Notes (Signed)
While cleaning the room, environmental services found a pain of khaki shorts and grey underwear.  Patient's cell phone was called and a message was left by [place], [place].  Unable to reach patient at this time.  Awaiting response.

## 2014-02-15 NOTE — Patient Care Conference (Signed)
Inpatient RehabilitationTeam Conference and Plan of Care Update Date: 02/14/2014   Time: 1:55 PM    Patient Name: Travis [name]      Medical Record Number: [id number]  Date of Birth: 09-19-1940 Sex: Male         Room/Bed: 4M02C/4M02C-01 Payor Info: Payor: MEDICARE / Plan: MEDICARE PART A AND B / Product Type: *No Product type* /    Admitting Diagnosis: B TKR  Admit Date/Time:  02/08/2014  1:06 PM Admission Comments: No comment available   Primary Diagnosis:  <principal problem not specified> Principal Problem: <principal problem not specified>  Patient Active Problem List   Diagnosis Date Noted  . Overweight (BMI 25.0-29.9) 02/08/2014  . Expected blood loss anemia 02/08/2014  . S/P TKR (total knee replacement) 02/08/2014  . S/P bilateral TKA 02/06/2014  . Encounter for therapeutic drug monitoring 08/08/2013  . Sinus bradycardia 01/04/2013  . Pulmonary hypertension 03/09/2012  . Hyponatremia 02/21/2012  . Bicuspid aortic valve   . Paroxysmal atrial fibrillation   . Hypertension   . Cardiomyopathy, rate related with resolution now recurrent 12/08/2011  . Recurrent left inguinal hernia 05/12/2011  . Left bundle branch block 12/10/2010  . DRY MOUTH 11/07/2009  . ING HERN W/O MENTION OBST/GANGREN UNILAT/UNSPEC 08/08/2009  . CAROTID BRUIT 07/05/2009  . TRANSIENT DISORDER INITIATING/MAINTAINING SLEEP 09/11/2008  . GERD 01/07/2008  . OSTEOARTHRITIS 02/04/2007  . GANGLION CYST, WRIST, LEFT 02/04/2007    Expected Discharge Date: Expected Discharge Date: 02/14/14  Team Members Present: Physician leading conference: Dr. Alger Simons Social Worker Present: Lennart Pall, LCSW Nurse Present: Elaina Pattee, RN PT Present: Melene Plan, PT OT Present: Roanna Epley, COTA     Current Status/Progress Goal Weekly Team Focus  Medical   double knee replacements for endstage OA. pain improving, some swelling  improve functional rom and stability  pain control, wound care    Bowel/Bladder   Continent of bowel and bladder; refuses laxatives/stool softeners at times; LBM 02/12/14  Continent of bowel and bladder  Offer toilet PRN   Swallow/Nutrition/ Hydration             ADL's   mod I overall  mod I overall  discharge home   Mobility   supervision overall  mod I overall  safety, B knee ROM, strengthening, functional mobility, activitiy tolerance, balance, education   Communication             Safety/Cognition/ Behavioral Observations            Pain   Pain in bilateral knees managed with oxycodone 5-15 mg (patient always requests 15) q6hr PRN and robaxin 500 mg q6hr PRN  Pain </=3  Offer pain medication prior to therapy and PRN   Skin   Bruise to right knee and left ankle; incisions to bilateral knees with surgical dressings intact  No new skin breakdown; no signs of infection  Monitor bilateral knees for signs of infection    Rehab Goals Patient on target to meet rehab goals: Yes *See Care Plan and progress notes for long and short-term goals.  Barriers to Discharge: none identified    Possible Resolutions to Barriers:  n/a pt leaving at mod I as planned    Discharge Planning/Teaching Needs:  home with brother to stay for a few days      Team Discussion:  Has made excellent gains and ready for d/c today  Revisions to Treatment Plan:  None   Continued Need for Acute Rehabilitation Level of Care: The patient requires daily  medical management by a physician with specialized training in physical medicine and rehabilitation for the following conditions: Daily direction of a multidisciplinary physical rehabilitation program to ensure safe treatment while eliciting the highest outcome that is of practical value to the patient.: Yes Daily medical management of patient stability for increased activity during participation in an intensive rehabilitation regime.: Yes Daily analysis of laboratory values and/or radiology reports with any subsequent need  for medication adjustment of medical intervention for : Post surgical problems;Other  Travis [name] 02/16/2014, 1:10 PM

## 2014-02-16 DIAGNOSIS — M25569 Pain in unspecified knee: Secondary | ICD-10-CM | POA: Diagnosis not present

## 2014-02-19 ENCOUNTER — Other Ambulatory Visit: Payer: Self-pay | Admitting: Internal Medicine

## 2014-02-20 DIAGNOSIS — M25569 Pain in unspecified knee: Secondary | ICD-10-CM | POA: Diagnosis not present

## 2014-02-24 DIAGNOSIS — M25562 Pain in left knee: Secondary | ICD-10-CM | POA: Diagnosis not present

## 2014-02-24 DIAGNOSIS — M25561 Pain in right knee: Secondary | ICD-10-CM | POA: Diagnosis not present

## 2014-02-26 ENCOUNTER — Other Ambulatory Visit: Payer: Self-pay | Admitting: Internal Medicine

## 2014-02-27 DIAGNOSIS — M25562 Pain in left knee: Secondary | ICD-10-CM | POA: Diagnosis not present

## 2014-02-27 DIAGNOSIS — M25561 Pain in right knee: Secondary | ICD-10-CM | POA: Diagnosis not present

## 2014-03-01 DIAGNOSIS — M25562 Pain in left knee: Secondary | ICD-10-CM | POA: Diagnosis not present

## 2014-03-01 DIAGNOSIS — M25561 Pain in right knee: Secondary | ICD-10-CM | POA: Diagnosis not present

## 2014-03-03 DIAGNOSIS — M25561 Pain in right knee: Secondary | ICD-10-CM | POA: Diagnosis not present

## 2014-03-03 DIAGNOSIS — M25562 Pain in left knee: Secondary | ICD-10-CM | POA: Diagnosis not present

## 2014-03-06 ENCOUNTER — Ambulatory Visit: Payer: Medicare Other

## 2014-03-06 ENCOUNTER — Ambulatory Visit: Payer: Medicare Other | Admitting: Family

## 2014-03-06 ENCOUNTER — Ambulatory Visit (INDEPENDENT_AMBULATORY_CARE_PROVIDER_SITE_OTHER): Payer: Medicare Other | Admitting: Family Medicine

## 2014-03-06 ENCOUNTER — Encounter: Payer: Self-pay | Admitting: Family Medicine

## 2014-03-06 VITALS — BP 122/72 | HR 80 | Temp 97.5°F | Wt 170.0 lb

## 2014-03-06 DIAGNOSIS — D500 Iron deficiency anemia secondary to blood loss (chronic): Secondary | ICD-10-CM | POA: Diagnosis not present

## 2014-03-06 DIAGNOSIS — F411 Generalized anxiety disorder: Secondary | ICD-10-CM | POA: Insufficient documentation

## 2014-03-06 DIAGNOSIS — E871 Hypo-osmolality and hyponatremia: Secondary | ICD-10-CM | POA: Diagnosis not present

## 2014-03-06 DIAGNOSIS — Z23 Encounter for immunization: Secondary | ICD-10-CM | POA: Diagnosis not present

## 2014-03-06 DIAGNOSIS — I10 Essential (primary) hypertension: Secondary | ICD-10-CM | POA: Diagnosis not present

## 2014-03-06 DIAGNOSIS — G4700 Insomnia, unspecified: Secondary | ICD-10-CM | POA: Insufficient documentation

## 2014-03-06 DIAGNOSIS — L649 Androgenic alopecia, unspecified: Secondary | ICD-10-CM | POA: Insufficient documentation

## 2014-03-06 LAB — COMPREHENSIVE METABOLIC PANEL
ALT: 12 U/L (ref 0–53)
AST: 22 U/L (ref 0–37)
Albumin: 3.5 g/dL (ref 3.5–5.2)
Alkaline Phosphatase: 104 U/L (ref 39–117)
BILIRUBIN TOTAL: 0.5 mg/dL (ref 0.2–1.2)
BUN: 18 mg/dL (ref 6–23)
CO2: 24 mEq/L (ref 19–32)
Calcium: 9.3 mg/dL (ref 8.4–10.5)
Chloride: 96 mEq/L (ref 96–112)
Creatinine, Ser: 0.8 mg/dL (ref 0.4–1.5)
GFR: 100.49 mL/min (ref >60.00)
Glucose, Bld: 132 mg/dL — ABNORMAL HIGH (ref 70–99)
POTASSIUM: 4.8 meq/L (ref 3.5–5.1)
SODIUM: 131 meq/L — AB (ref 135–145)
Total Protein: 7.6 g/dL (ref 6.0–8.3)

## 2014-03-06 LAB — CBC
HEMATOCRIT: 43.9 % (ref 39.0–52.0)
Hemoglobin: 14.4 g/dL (ref 13.0–17.0)
MCHC: 32.8 g/dL (ref 30.0–36.0)
MCV: 102.5 fl — ABNORMAL HIGH (ref 78.0–100.0)
Platelets: 279 10*3/uL (ref 150.0–400.0)
RBC: 4.28 Mil/uL (ref 4.22–5.81)
RDW: 13.9 % (ref 11.5–15.5)
WBC: 6.9 10*3/uL (ref 4.0–10.5)

## 2014-03-06 NOTE — Patient Instructions (Signed)
You can restart all of your medicines that were held when you left the hospital.   Flu shot today.   Blood pressure continues to look great!   Labs today to see how your anemia is doing.

## 2014-03-06 NOTE — Assessment & Plan Note (Signed)
Check labs today to make sure stable. Stable over years. Previous workup unremarkable. Asymptomatic. Make sure to avoid HCTZ

## 2014-03-06 NOTE — Assessment & Plan Note (Signed)
After surgery. Taking iron. Recheck CBC today-hopefully improving.

## 2014-03-06 NOTE — Progress Notes (Signed)
Travis Reddish, MD Phone: [phone]  Subjective:  Patient presents today to establish care with me as their new primary care provider. Patient was formerly a patient of Dr. Arnoldo Morale. Chief complaint-noted.   Recently hospitalized for TKR, and was taken off of off meloxicam (hand arthritis), b-complex, fish oil, glucosamine, flaxseed. Wants to know when he can restart. Advised he can take them now but as taking narcotics-no need for mobic at present.   Hypertension-controlled  BP Readings from Last 3 Encounters:  03/06/14 122/72  02/14/14 153/72  02/08/14 142/70  Compliant with medications-yes without side effects ROS-Denies any CP, HA, blurry vision.   Anemia-stable at last check  Postoperative as low as 10.9.  ROS- no chest pain. No worsening fatigue.   Chronic hyponatremia. Stable. Previous workup unremarkable. Noted while in hospital. Recheck levels today. ROS-no confusion  The following were reviewed and entered/updated in epic: Past Medical History  Diagnosis Date  . Arthritis   . Hypertension   . GERD (gastroesophageal reflux disease)   . Bicuspid aortic valve   . 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 Sinus bradycardia   . Paroxysmal atrial fibrillation   . Cardiomyopathy -resolved     tachycardia-induced, EF 55% June 2009  . History of cardioversion 12/16/2012  . Left bundle branch block   . CHF (congestive heart failure)     JULY 2014  . History of stomach ulcers    Patient Active Problem List   Diagnosis Date Noted  . Pulmonary hypertension 03/09/2012    Priority: High  . Bicuspid aortic valve     Priority: High  . Paroxysmal atrial fibrillation     Priority: High  . Cardiomyopathy, rate related with resolution now recurrent 12/08/2011    Priority: High  . Anxiety state 03/06/2014    Priority: Medium  . Insomnia 03/06/2014    Priority: Medium  . Expected blood loss anemia 02/08/2014    Priority: Medium  . Sinus bradycardia 01/04/2013   Priority: Medium  . Hyponatremia 02/21/2012    Priority: Medium  . Hypertension     Priority: Medium  . Left bundle branch block 12/10/2010    Priority: Medium  . Male pattern baldness 03/06/2014    Priority: Low  . Overweight (BMI 25.0-29.9) 02/08/2014    Priority: Low  . S/P TKR (total knee replacement) 02/08/2014    Priority: Low  . DRY MOUTH 11/07/2009    Priority: Low  . Carotid bruit 07/05/2009    Priority: Low  . GERD 01/07/2008    Priority: Low  . Osteoarthritis 02/04/2007    Priority: Low  . GANGLION CYST, WRIST, LEFT 02/04/2007    Priority: Low   Past Surgical History  Procedure Laterality Date  . Cerebral anuersym post clips    . Rotator cuff repair      lf  . Fractured left arm    . Left tendon repair      lft foot  . Hernia repair      lft  . Wrist ganglion excision      lft  . Tonsillectomy    . Inguinal hernia repair  07/02/2011    Procedure: LAPAROSCOPIC INGUINAL HERNIA;  Surgeon: Harl Bowie, MD;  Location: [facility];  Service: General;  Laterality: Left;  Laparoscopic left inguinal hernia repair and mesh  . Cardiac catheterization    . Cardioversion N/A 12/16/2012    Procedure: CARDIOVERSION;  Surgeon: Lelon Perla, MD;  Location: [facility];  Service: Cardiovascular;  Laterality: N/A;  .  Total knee arthroplasty Bilateral 02/06/2014    Procedure: TOTAL KNEE BILATERAL;  Surgeon: Mauri Pole, MD;  Location: [facility];  Service: Orthopedics;  Laterality: Bilateral;    Family History  Problem Relation Age of Onset  . Stroke Father 53    smoker  . Lung cancer Mother 53    former smoker  . Stroke Mother   . Colon cancer Neg Hx   . Stroke Brother     Medications- reviewed and updated Current Outpatient Prescriptions  Medication Sig Dispense Refill  . acetaminophen (TYLENOL) 500 MG tablet Take 1,000 mg by mouth every 6 (six) hours as needed for mild pain or moderate pain.      Marland Kitchen amLODipine (NORVASC) 5 MG tablet Take 1 tablet (5 mg total) by  mouth every morning.  30 tablet  1  . b complex vitamins tablet Take 1 tablet by mouth daily.       . carvedilol (COREG) 3.125 MG tablet Take 1 tablet (3.125 mg total) by mouth 2 (two) times daily with a meal.  60 tablet  1  . dabigatran (PRADAXA) 150 MG CAPS capsule Take 1 capsule (150 mg total) by mouth 2 (two) times daily.  60 capsule  5  . docusate sodium 100 MG CAPS Take 100 mg by mouth 2 (two) times daily.  10 capsule  0  . dofetilide (TIKOSYN) 500 MCG capsule Take 1 capsule (500 mcg total) by mouth every 12 (twelve) hours.  60 capsule  1  . ferrous sulfate 325 (65 FE) MG tablet Take 1 tablet (325 mg total) by mouth 3 (three) times daily after meals.  90 tablet  3  . finasteride (PROPECIA) 1 MG tablet Take 1 mg by mouth daily.      . fish oil-omega-3 fatty acids 1000 MG capsule Take 1,000 mg by mouth daily.       . Flaxseed, Linseed, (RA FLAX SEED OIL 1000 PO) Take 1,000 mg by mouth 3 (three) times daily.       Marland Kitchen omeprazole (PRILOSEC) 20 MG capsule Take 1 capsule (20 mg total) by mouth daily.  30 capsule  1  . telmisartan (MICARDIS) 80 MG tablet Take 1 tablet (80 mg total) by mouth at bedtime.  30 tablet  1  . temazepam (RESTORIL) 15 MG capsule TAKE ONE CAPSULE BY MOUTH EVERY NIGHT AT BEDTIME AS NEEDED  30 capsule  0  . oxyCODONE (OXY IR/ROXICODONE) 5 MG immediate release tablet Take 1-3 tablets (5-15 mg total) by mouth every 4 (four) hours as needed for severe pain.  90 tablet  0   No current facility-administered medications for this visit.    Allergies-reviewed and updated No Known Allergies  History   Social History  . Marital Status: Single    Spouse Name: N/A    Number of Children: 0  . Years of Education: N/A   Occupational History  . Retired    Social History Main Topics  . Smoking status: Former Smoker -- 1.50 packs/day for 25 years    Quit date: 05/26/1986  . Smokeless tobacco: Never Used  . Alcohol Use: 0.5 oz/week    1 drink(s) per week     Comment: 1 martini  daily   . Drug Use: No  . Sexual Activity: Not Currently   Other Topics Concern  . None   Social History Narrative   Homosexual. Lives alone. Not sexually active.       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 Ext: trace edema Skin: warm, dry, no rash Neuro: grossly normal, moves all extremities, PERRLA   Assessment/Plan:  Hypertension Continue amlodipine 5mg , carvedilol 3.125mg  BID, telmisartan 80mg  as well controlled.   Hyponatremia Check labs today to make sure stable. Stable over years. Previous workup unremarkable. Asymptomatic. Make sure to avoid HCTZ  Expected blood loss anemia After surgery. Taking iron. Recheck CBC today-hopefully improving.    Orders Placed This Encounter  Procedures  . CBC    [place]  . Comprehensive metabolic panel    [place]

## 2014-03-06 NOTE — Assessment & Plan Note (Signed)
Continue amlodipine 5mg , carvedilol 3.125mg  BID, telmisartan 80mg  as well controlled.

## 2014-03-07 DIAGNOSIS — M25562 Pain in left knee: Secondary | ICD-10-CM | POA: Diagnosis not present

## 2014-03-07 DIAGNOSIS — M25561 Pain in right knee: Secondary | ICD-10-CM | POA: Diagnosis not present

## 2014-03-09 DIAGNOSIS — M67432 Ganglion, left wrist: Secondary | ICD-10-CM | POA: Diagnosis not present

## 2014-03-10 DIAGNOSIS — M25561 Pain in right knee: Secondary | ICD-10-CM | POA: Diagnosis not present

## 2014-03-10 DIAGNOSIS — M25562 Pain in left knee: Secondary | ICD-10-CM | POA: Diagnosis not present

## 2014-03-13 DIAGNOSIS — M25562 Pain in left knee: Secondary | ICD-10-CM | POA: Diagnosis not present

## 2014-03-13 DIAGNOSIS — M25561 Pain in right knee: Secondary | ICD-10-CM | POA: Diagnosis not present

## 2014-03-17 DIAGNOSIS — M25562 Pain in left knee: Secondary | ICD-10-CM | POA: Diagnosis not present

## 2014-03-17 DIAGNOSIS — M25561 Pain in right knee: Secondary | ICD-10-CM | POA: Diagnosis not present

## 2014-03-21 DIAGNOSIS — Z96652 Presence of left artificial knee joint: Secondary | ICD-10-CM | POA: Diagnosis not present

## 2014-03-21 DIAGNOSIS — Z96653 Presence of artificial knee joint, bilateral: Secondary | ICD-10-CM | POA: Diagnosis not present

## 2014-03-21 DIAGNOSIS — Z471 Aftercare following joint replacement surgery: Secondary | ICD-10-CM | POA: Diagnosis not present

## 2014-03-21 DIAGNOSIS — Z96651 Presence of right artificial knee joint: Secondary | ICD-10-CM | POA: Diagnosis not present

## 2014-03-22 DIAGNOSIS — M25562 Pain in left knee: Secondary | ICD-10-CM | POA: Diagnosis not present

## 2014-03-22 DIAGNOSIS — M25561 Pain in right knee: Secondary | ICD-10-CM | POA: Diagnosis not present

## 2014-03-23 DIAGNOSIS — M25562 Pain in left knee: Secondary | ICD-10-CM | POA: Diagnosis not present

## 2014-03-23 DIAGNOSIS — M25561 Pain in right knee: Secondary | ICD-10-CM | POA: Diagnosis not present

## 2014-03-24 ENCOUNTER — Other Ambulatory Visit: Payer: Self-pay | Admitting: Internal Medicine

## 2014-03-27 DIAGNOSIS — M25561 Pain in right knee: Secondary | ICD-10-CM | POA: Diagnosis not present

## 2014-03-27 DIAGNOSIS — M25562 Pain in left knee: Secondary | ICD-10-CM | POA: Diagnosis not present

## 2014-03-28 DIAGNOSIS — M25562 Pain in left knee: Secondary | ICD-10-CM | POA: Diagnosis not present

## 2014-03-28 DIAGNOSIS — M25561 Pain in right knee: Secondary | ICD-10-CM | POA: Diagnosis not present

## 2014-03-30 DIAGNOSIS — M170 Bilateral primary osteoarthritis of knee: Secondary | ICD-10-CM | POA: Diagnosis not present

## 2014-03-31 DIAGNOSIS — M170 Bilateral primary osteoarthritis of knee: Secondary | ICD-10-CM | POA: Diagnosis not present

## 2014-03-31 DIAGNOSIS — H2513 Age-related nuclear cataract, bilateral: Secondary | ICD-10-CM | POA: Diagnosis not present

## 2014-04-02 ENCOUNTER — Other Ambulatory Visit: Payer: Self-pay | Admitting: Family Medicine

## 2014-04-03 DIAGNOSIS — M170 Bilateral primary osteoarthritis of knee: Secondary | ICD-10-CM | POA: Diagnosis not present

## 2014-04-03 NOTE — Telephone Encounter (Signed)
yes

## 2014-04-03 NOTE — Telephone Encounter (Signed)
Is this ok to refill?  

## 2014-04-04 DIAGNOSIS — M170 Bilateral primary osteoarthritis of knee: Secondary | ICD-10-CM | POA: Diagnosis not present

## 2014-04-18 ENCOUNTER — Telehealth: Payer: Self-pay | Admitting: Family Medicine

## 2014-04-18 MED ORDER — TELMISARTAN 80 MG PO TABS: 80.0000 mg | ORAL_TABLET | Freq: Every day | ORAL | Status: DC

## 2014-04-18 NOTE — Telephone Encounter (Signed)
Medication refilled

## 2014-04-18 NOTE — Telephone Encounter (Signed)
[id number] - [employer] [employer], [place] [phone] is requesting re-fill on telmisartan (MICARDIS) 80 MG tablet

## 2014-04-19 ENCOUNTER — Other Ambulatory Visit: Payer: Self-pay

## 2014-04-19 MED ORDER — TELMISARTAN 80 MG PO TABS: 80.0000 mg | ORAL_TABLET | Freq: Every day | ORAL | Status: DC

## 2014-04-27 DIAGNOSIS — M67432 Ganglion, left wrist: Secondary | ICD-10-CM | POA: Diagnosis not present

## 2014-05-04 DIAGNOSIS — L218 Other seborrheic dermatitis: Secondary | ICD-10-CM | POA: Diagnosis not present

## 2014-05-04 DIAGNOSIS — L821 Other seborrheic keratosis: Secondary | ICD-10-CM | POA: Diagnosis not present

## 2014-05-04 DIAGNOSIS — D2262 Melanocytic nevi of left upper limb, including shoulder: Secondary | ICD-10-CM | POA: Diagnosis not present

## 2014-05-04 DIAGNOSIS — L570 Actinic keratosis: Secondary | ICD-10-CM | POA: Diagnosis not present

## 2014-05-04 DIAGNOSIS — L812 Freckles: Secondary | ICD-10-CM | POA: Diagnosis not present

## 2014-05-04 DIAGNOSIS — D225 Melanocytic nevi of trunk: Secondary | ICD-10-CM | POA: Diagnosis not present

## 2014-05-07 ENCOUNTER — Other Ambulatory Visit: Payer: Self-pay | Admitting: Family Medicine

## 2014-05-08 NOTE — Telephone Encounter (Signed)
Can give 5 refills, sees me back in about 5 months looks like

## 2014-05-08 NOTE — Telephone Encounter (Signed)
Is this ok to refill?  

## 2014-05-08 NOTE — Telephone Encounter (Signed)
Medication faxed

## 2014-05-20 ENCOUNTER — Other Ambulatory Visit: Payer: Self-pay | Admitting: Internal Medicine

## 2014-05-23 DIAGNOSIS — M67432 Ganglion, left wrist: Secondary | ICD-10-CM | POA: Diagnosis not present

## 2014-05-30 DIAGNOSIS — M67432 Ganglion, left wrist: Secondary | ICD-10-CM | POA: Diagnosis not present

## 2014-06-07 ENCOUNTER — Other Ambulatory Visit: Payer: Self-pay

## 2014-06-07 ENCOUNTER — Telehealth: Payer: Self-pay

## 2014-06-07 MED ORDER — MELOXICAM 15 MG PO TABS: 15.0000 mg | ORAL_TABLET | Freq: Every day | ORAL | Status: DC

## 2014-06-07 NOTE — Telephone Encounter (Signed)
Medication refilled

## 2014-06-07 NOTE — Telephone Encounter (Signed)
Refill request for Meloxicam 15mg  tab. It looks like Pt stopped taking this in 09/15. Is this ok to refill?

## 2014-06-07 NOTE — Telephone Encounter (Signed)
Ok to fill 

## 2014-06-21 ENCOUNTER — Ambulatory Visit (INDEPENDENT_AMBULATORY_CARE_PROVIDER_SITE_OTHER): Payer: Medicare Other | Admitting: Podiatry

## 2014-06-21 ENCOUNTER — Encounter: Payer: Self-pay | Admitting: Podiatry

## 2014-06-21 ENCOUNTER — Ambulatory Visit (INDEPENDENT_AMBULATORY_CARE_PROVIDER_SITE_OTHER): Payer: Medicare Other

## 2014-06-21 VITALS — BP 135/76 | HR 59 | Resp 16

## 2014-06-21 DIAGNOSIS — M898X9 Other specified disorders of bone, unspecified site: Secondary | ICD-10-CM

## 2014-06-21 DIAGNOSIS — L84 Corns and callosities: Secondary | ICD-10-CM

## 2014-06-21 NOTE — Progress Notes (Signed)
   Subjective:    Patient ID: Travis [name], male    DOB: 15-Dec-1940, 74 y.o.   MRN: [id number]  HPI Comments: "I have a corn on the toe"  Patient c/o tender 5th toe right for few weeks. There a callused area. Sore with shoes.  Toe Pain       Review of Systems  All other systems reviewed and are negative.      Objective:   Physical Exam        Assessment & Plan:

## 2014-06-22 IMAGING — CR DG CHEST 1V PORT
1 series · 1 of 1 positions shown · non-contrast
Comparison: 03/31/2012 and 06/24/2011.

CLINICAL DATA: Shortness of breath with cough for 2 hours.  History
of hypertension and, atrial fibrillation and cardiomyopathy.

PORTABLE CHEST - 1 VIEW

[AP]
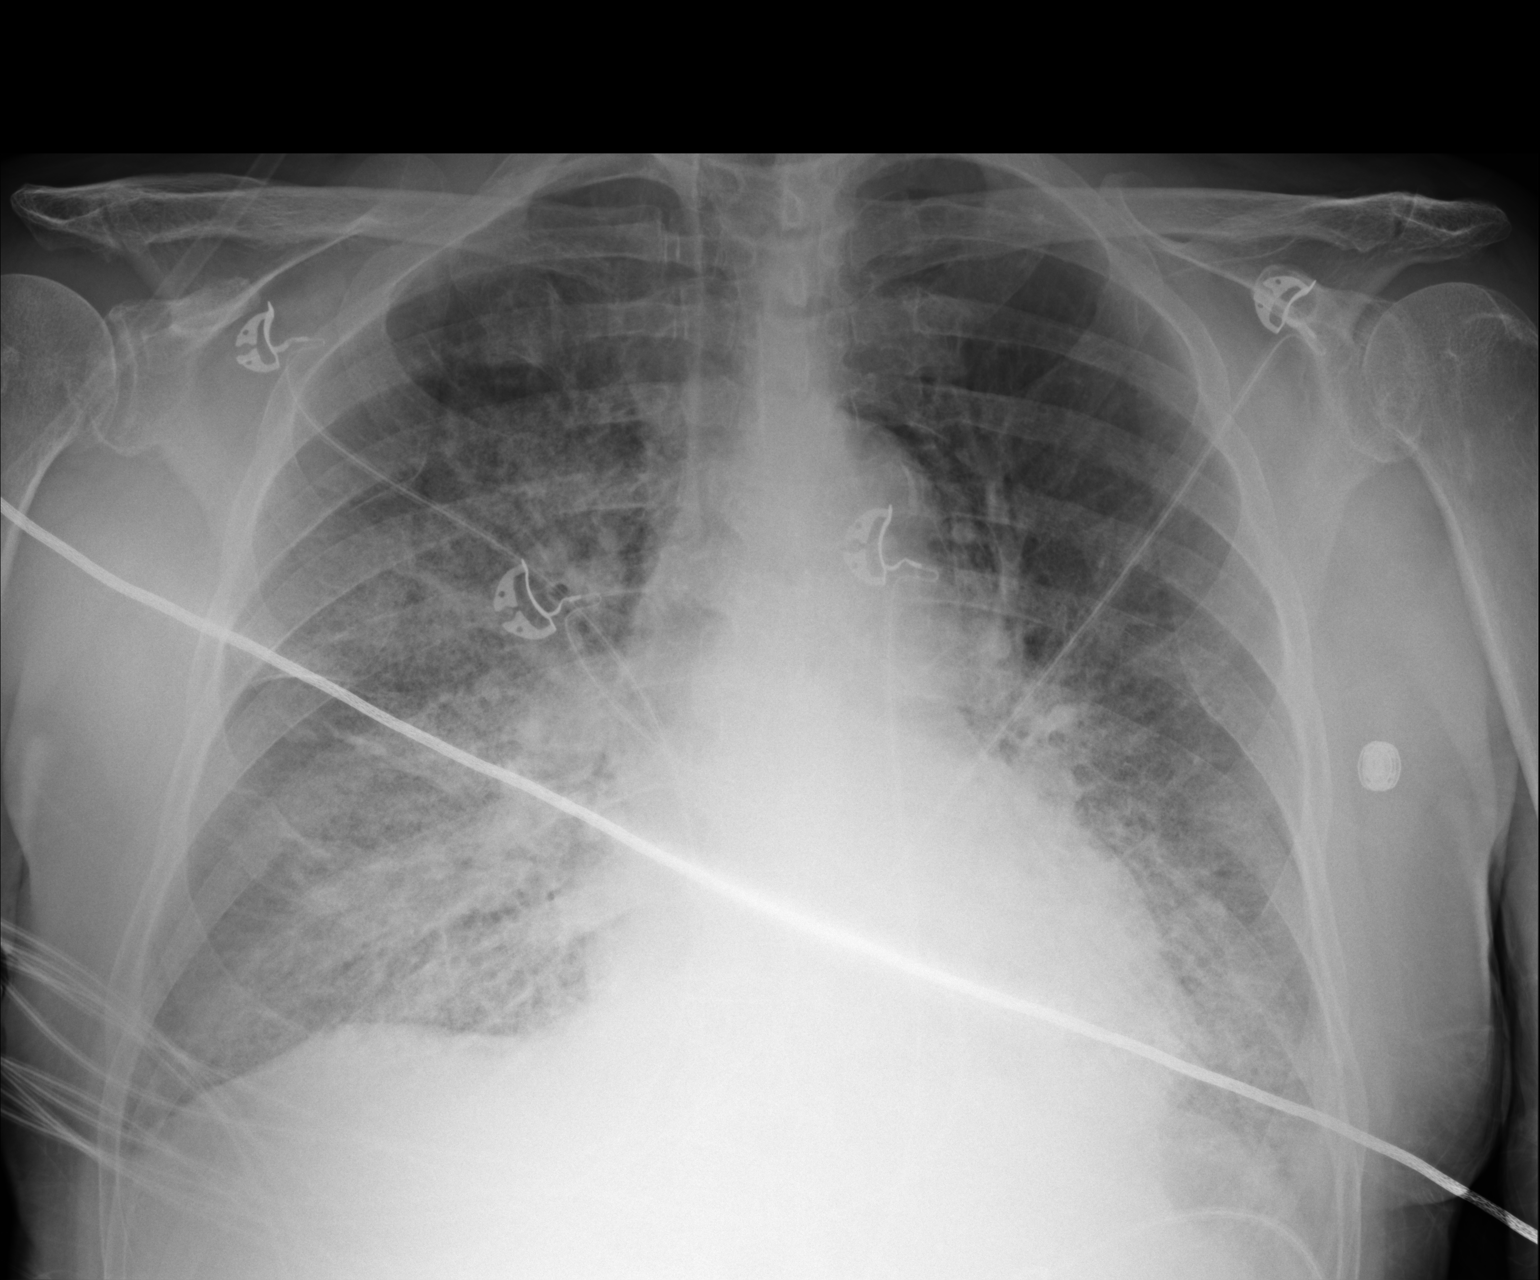

[1 of 1 positions shown; findings below may reference images not displayed]

FINDINGS: 1925 hours.  The heart size and mediastinal contours are
stable.  There are new bilateral perihilar and lower lobe airspace
opacities with septal lines consistent with pulmonary edema.  No
significant pleural effusion is seen.  Mild lucency along the
aortic arch is attributed to spared lung or small paracentral
blebs; no definite pneumomediastinum identified.
IMPRESSION: Congestive heart failure with diffuse pulmonary edema.

## 2014-06-22 NOTE — Progress Notes (Signed)
Subjective:     Patient ID: Travis [name], male   DOB: 08-10-1940, 74 y.o.   MRN: [id number]  HPI patient points to the right fifth toe stating it's been hurting and making it hard to walk. he's tried to wear different types of shoes to accommodate the symptoms and it has been hard to find shoes that are comfortable   Review of Systems  All other systems reviewed and are negative.      Objective:   Physical Exam  Constitutional: He is oriented to person, place, and time.  Cardiovascular: Intact distal pulses.   Musculoskeletal: Normal range of motion.  Neurological: He is oriented to person, place, and time.  Skin: Skin is warm.  Nursing note and vitals reviewed.  neurovascular status intact with muscle strength adequate and range of motion of the subtalar midtarsal joint within normal limits. Patient is noted to have rotated fifth toe with distal lateral keratotic lesion that's moderately painful when pressed and is noted to have good digital perfusion and is well oriented 3     Assessment:     Hammertoe deformity with distal lateral keratotic lesion fifth toe right    Plan:     Reviewed condition and discussed treatment options. At this time debridement was accomplished with padding and instructed on wider-type shoe gear and will be seen back to recheck if symptoms persist for distal arthroplasty procedure

## 2014-06-23 IMAGING — CR DG CHEST 2V
2 series · 2 of 2 positions shown · non-contrast
Comparison: 12/18/2012 03/31/2012

CLINICAL DATA: Congestive heart failure.  Cough.

CHEST - 2 VIEW

[w chest pa]
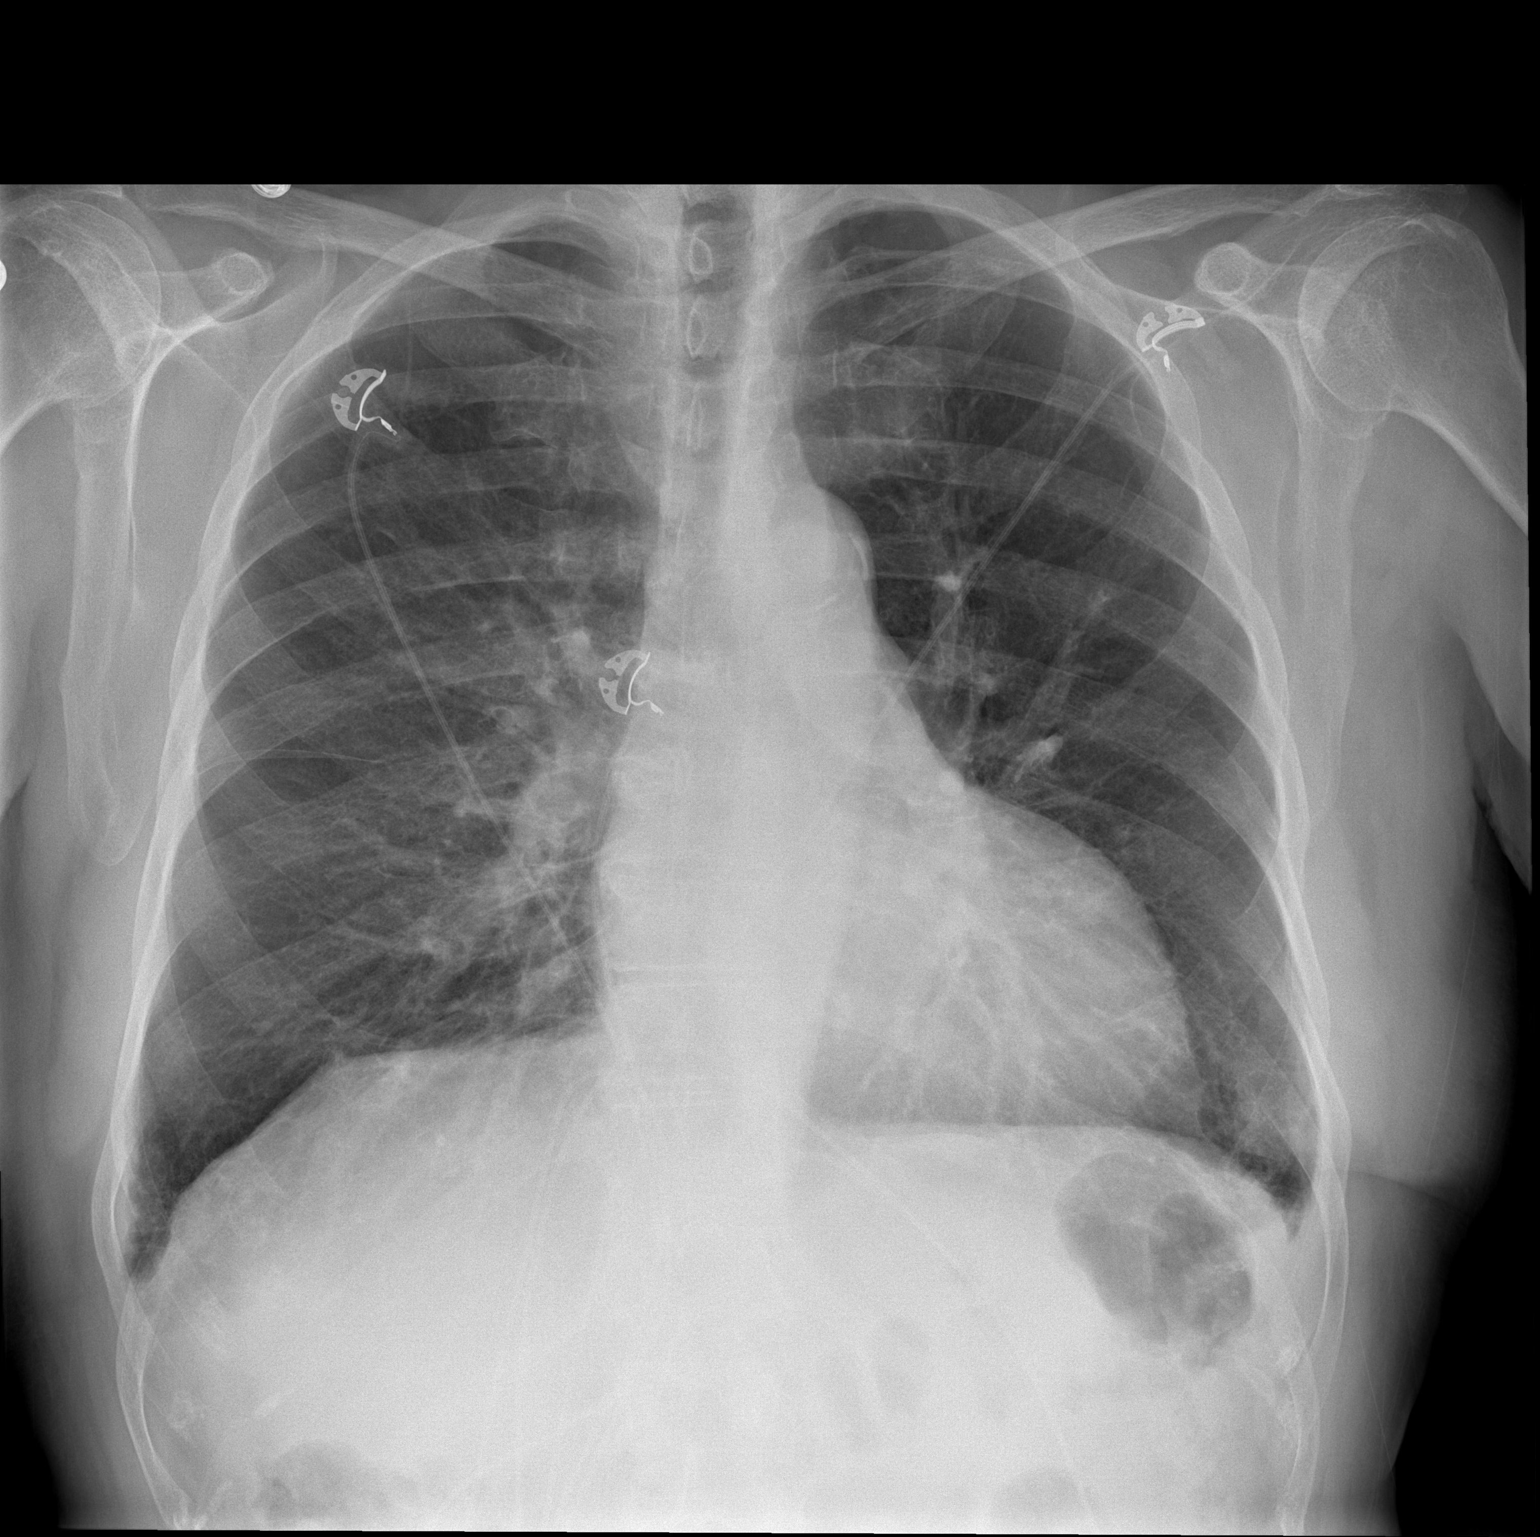

[w chest lat]
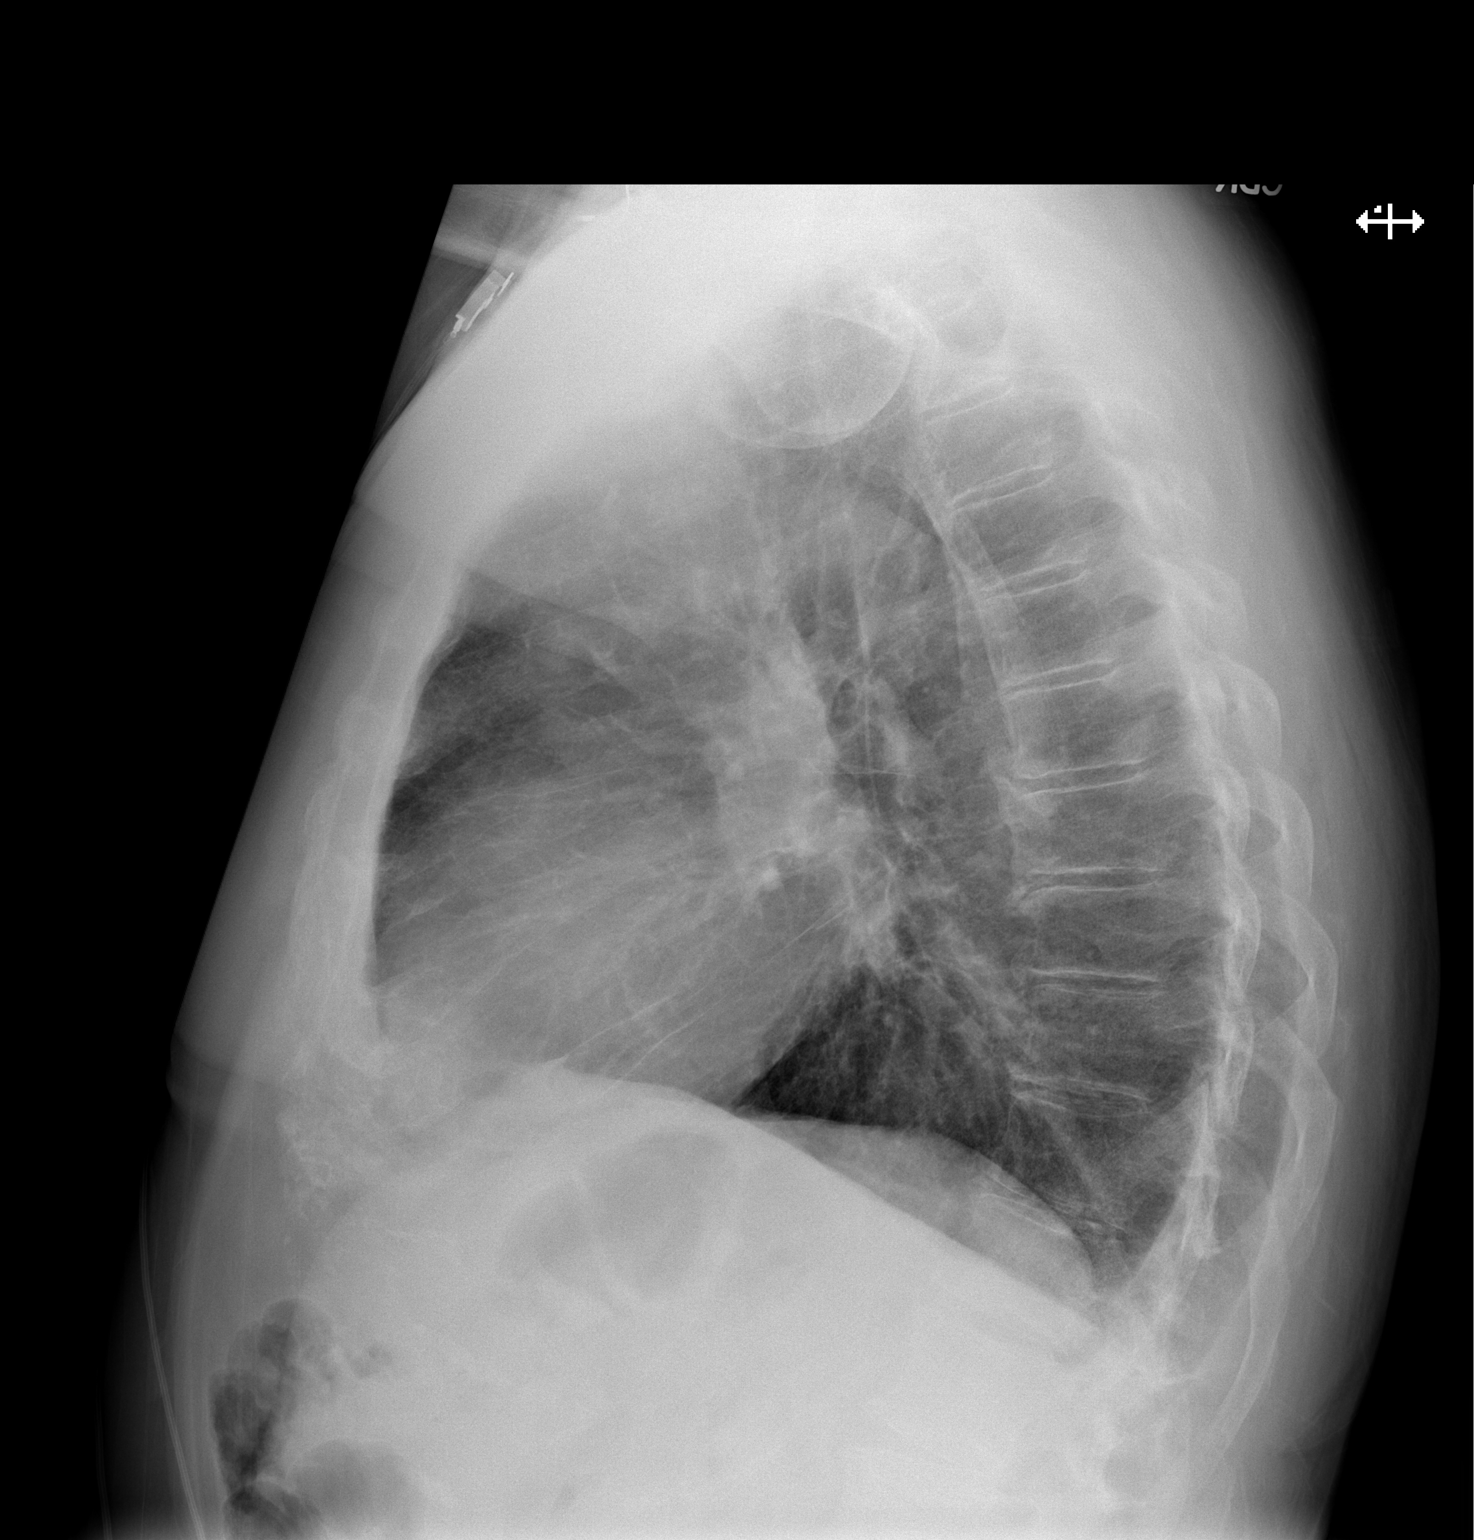

[2 of 2 positions shown; findings below may reference images not displayed]

FINDINGS: The interstitial pulmonary edema has almost completely
resolved.  Tiny residual effusions.

Heart size and pulmonary vascularity are normal.  No acute osseous
abnormality.
IMPRESSION: Almost complete resolution of congestive heart failure.

## 2014-07-02 ENCOUNTER — Other Ambulatory Visit: Payer: Self-pay | Admitting: Family Medicine

## 2014-07-05 ENCOUNTER — Other Ambulatory Visit: Payer: Self-pay | Admitting: Gastroenterology

## 2014-07-11 DIAGNOSIS — M67432 Ganglion, left wrist: Secondary | ICD-10-CM | POA: Diagnosis not present

## 2014-07-21 ENCOUNTER — Other Ambulatory Visit: Payer: Self-pay | Admitting: Orthopedic Surgery

## 2014-07-21 DIAGNOSIS — M67442 Ganglion, left hand: Secondary | ICD-10-CM | POA: Diagnosis not present

## 2014-07-21 DIAGNOSIS — M67432 Ganglion, left wrist: Secondary | ICD-10-CM | POA: Diagnosis not present

## 2014-07-25 ENCOUNTER — Ambulatory Visit (HOSPITAL_COMMUNITY): Payer: Medicare Other | Attending: Cardiology

## 2014-07-25 DIAGNOSIS — I480 Paroxysmal atrial fibrillation: Secondary | ICD-10-CM | POA: Diagnosis not present

## 2014-07-25 NOTE — Progress Notes (Signed)
2D Echo completed. 07/25/2014

## 2014-08-06 ENCOUNTER — Other Ambulatory Visit: Payer: Self-pay | Admitting: Internal Medicine

## 2014-08-08 ENCOUNTER — Telehealth: Payer: Self-pay | Admitting: Internal Medicine

## 2014-08-08 MED ORDER — CARVEDILOL 3.125 MG PO TABS: ORAL_TABLET | ORAL | Status: DC

## 2014-08-08 NOTE — Telephone Encounter (Signed)
Informed patient that I would send new prescription for refills.  Advised I will have scheduler call him to arrange follow up -- patient is agreeable and thankful for helping.

## 2014-08-08 NOTE — Telephone Encounter (Signed)
New Msg      Pt c/o medication issue:  1. Name of Medication: Carvedilol  2. How are you currently taking this medication (dosage and times per day)? 3.125 mg twice daily  3. Are you having a reaction (difficulty breathing--STAT)? no  4. What is your medication issue? Pt states he was only given a half a month refill/ 15 day supply and will need more.   Please return pt call.

## 2014-08-12 ENCOUNTER — Other Ambulatory Visit: Payer: Self-pay | Admitting: Gastroenterology

## 2014-08-14 ENCOUNTER — Encounter: Payer: Self-pay | Admitting: Internal Medicine

## 2014-08-14 ENCOUNTER — Ambulatory Visit (INDEPENDENT_AMBULATORY_CARE_PROVIDER_SITE_OTHER): Payer: Medicare Other | Admitting: Internal Medicine

## 2014-08-14 VITALS — BP 152/66 | HR 49 | Ht 68.0 in | Wt 182.4 lb

## 2014-08-14 DIAGNOSIS — I4891 Unspecified atrial fibrillation: Secondary | ICD-10-CM

## 2014-08-14 NOTE — Patient Instructions (Signed)
Your physician recommends that you return for lab work in: [place] (BMET/Mg)  Your physician wants you to follow-up in: 6 months with Travis [name].  You will receive a reminder letter in the mail two months in advance. If you don't receive a letter, please call our office to schedule the follow-up appointment.  Your physician wants you to follow-up in: 12 months with Travis [name]. You will receive a reminder letter in the mail two months in advance. If you don't receive a letter, please call our office to schedule the follow-up appointment.

## 2014-08-14 NOTE — Progress Notes (Signed)
Patient Care Team: [name], MD as PCP - General (Family Medicine)   HPI  Travis [name] is a 74 y.o. male Seen in followup for paroxysmal atrial fibrillation and tachycardia-induced and resolved cardiomyopathy seen in followup today.   He ended up with congestive heart failure 7/14 with an ejection fraction of 35%. Propafenone was discontinued and dofetilide was initiated. Echocardiogram 10/14 demonstrated normalization of LV function.  The patient denies chest pain, shortness of breath, nocturnal dyspnea, orthopnea or peripheral edema.  There have been no palpitations, lightheadedness or syncope.   He also has a probable bicuspid aortic valve with mild stenosis; repeat echocardiogram 3/16 demonstrated stable parameters. LV systolic function was normal  He underwent knee replacement without difficulty   Past Medical History  Diagnosis Date  . Arthritis   . Hypertension   . GERD (gastroesophageal reflux disease)   . Bicuspid aortic valve   . 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 Sinus bradycardia   . Paroxysmal atrial fibrillation   . Cardiomyopathy -resolved     tachycardia-induced, EF 55% June 2009  . History of cardioversion 12/16/2012  . Left bundle branch block   . CHF (congestive heart failure)     JULY 2014  . History of stomach ulcers     Past Surgical History  Procedure Laterality Date  . Cerebral anuersym post clips    . Rotator cuff repair      lf  . Fractured left arm    . Left tendon repair      lft foot  . Hernia repair      lft  . Wrist ganglion excision      lft  . Tonsillectomy    . Inguinal hernia repair  07/02/2011    Procedure: LAPAROSCOPIC INGUINAL HERNIA;  Surgeon: Harl Bowie, MD;  Location: [place];  Service: General;  Laterality: Left;  Laparoscopic left inguinal hernia repair and mesh  . Cardiac catheterization    . Cardioversion N/A 12/16/2012    Procedure: CARDIOVERSION;  Surgeon: Lelon Perla, MD;  Location: [facility]  ENDOSCOPY;  Service: Cardiovascular;  Laterality: N/A;  . Total knee arthroplasty Bilateral 02/06/2014    Procedure: TOTAL KNEE BILATERAL;  Surgeon: Mauri Pole, MD;  Location: [facility];  Service: Orthopedics;  Laterality: Bilateral;    Current Outpatient Prescriptions  Medication Sig Dispense Refill  . acetaminophen (TYLENOL) 500 MG tablet Take 1,000 mg by mouth every 6 (six) hours as needed for mild pain or moderate pain.    Marland Kitchen amLODipine (NORVASC) 5 MG tablet Take 1 tablet (5 mg total) by mouth every morning. 30 tablet 1  . b complex vitamins tablet Take 1 tablet by mouth daily.     . carvedilol (COREG) 3.125 MG tablet TAKE 1 TABLET BY MOUTH TWICE DAILY 60 tablet 2  . dabigatran (PRADAXA) 150 MG CAPS capsule Take 1 capsule (150 mg total) by mouth 2 (two) times daily. 60 capsule 5  . docusate sodium 100 MG CAPS Take 100 mg by mouth 2 (two) times daily. 10 capsule 0  . dofetilide (TIKOSYN) 500 MCG capsule Take 1 capsule (500 mcg total) by mouth every 12 (twelve) hours. 60 capsule 1  . ferrous sulfate 325 (65 FE) MG tablet Take 1 tablet (325 mg total) by mouth 3 (three) times daily after meals. 90 tablet 3  . finasteride (PROPECIA) 1 MG tablet Take 1 mg by mouth daily.    . fish oil-omega-3 fatty acids 1000 MG capsule Take 1,000  mg by mouth daily.     . Flaxseed, Linseed, (RA FLAX SEED OIL 1000 PO) Take 1,000 mg by mouth 3 (three) times daily.     . meloxicam (MOBIC) 15 MG tablet Take 1 tablet (15 mg total) by mouth daily. 30 tablet 3  . omeprazole (PRILOSEC) 20 MG capsule Take 1 capsule (20 mg total) by mouth daily. 30 capsule 1  . telmisartan (MICARDIS) 80 MG tablet TAKE 1 TABLET BY MOUTH AT BEDTIME 30 tablet 3  . temazepam (RESTORIL) 15 MG capsule TAKE ONE CAPSULE BY MOUTH AT BEDTIME AS NEEDED 30 capsule 5   No current facility-administered medications for this visit.    No Known Allergies  Review of Systems negative except from HPI and PMH  Physical Exam BP 152/66 mmHg  Pulse 49   Ht 5\' 8"  (1.727 m)  Wt 182 lb 6.4 oz (82.736 kg)  BMI 27.74 kg/m2 Well developed and well nourished in no acute distress HENT normal E scleral and icterus clear Neck Supple JVP flat; carotids brisk and full Clear to ausculation  slow Regular rate and rhythm, no murmurs gallops or rub Soft with active bowel sounds No clubbing cyanosis none Edema Alert and oriented, grossly normal motor and sensory function Skin Warm and Dry  ECG ordered today and  demonstrates a  heart rate of 43 Intervals 16/13/46 Axis left -48  Assessment and  Plan  Atrial fibrillation-paroxysmal holding sinus rhythm on dofetilide. We'll check his surveillance laboratories today He remains on dabigitran  Cardiomyopathy-tachycardia mediated-resolved Continue beta blockers and ARB  Bicuspid aortic valve-   Hypertension    His aortic stenosis/bicuspid valve remained stable. He will need repeat echo in about 2 years. Carotid Dopplers were scheduled to be repeated 4/17.  His blood pressure is modestly elevated; he will work on checking it at home. I've asked that he follow-up with his PCP if her persists higher than the 130 range   Atrial fibrillation is quiescent as noted above  I will have him follow-up with the A. fib clinic in 6 months and I will see him in 12 months.

## 2014-08-14 NOTE — Telephone Encounter (Signed)
Needs office visit for additional refills.  

## 2014-08-15 ENCOUNTER — Other Ambulatory Visit (INDEPENDENT_AMBULATORY_CARE_PROVIDER_SITE_OTHER): Payer: Medicare Other

## 2014-08-15 DIAGNOSIS — I4891 Unspecified atrial fibrillation: Secondary | ICD-10-CM | POA: Diagnosis not present

## 2014-08-15 LAB — BASIC METABOLIC PANEL
BUN: 18 mg/dL (ref 6–23)
CO2: 29 mEq/L (ref 19–32)
Calcium: 8.9 mg/dL (ref 8.4–10.5)
Chloride: 100 mEq/L (ref 96–112)
Creatinine, Ser: 0.72 mg/dL (ref 0.40–1.50)
GFR: 113.35 mL/min (ref >60.00)
Glucose, Bld: 108 mg/dL — ABNORMAL HIGH (ref 70–99)
POTASSIUM: 4 meq/L (ref 3.5–5.1)
SODIUM: 132 meq/L — AB (ref 135–145)

## 2014-08-15 LAB — MAGNESIUM: Magnesium: 1.9 mg/dL (ref 1.5–2.5)

## 2014-08-20 ENCOUNTER — Other Ambulatory Visit: Payer: Self-pay | Admitting: Internal Medicine

## 2014-08-21 ENCOUNTER — Other Ambulatory Visit: Payer: Self-pay | Admitting: Internal Medicine

## 2014-09-02 ENCOUNTER — Other Ambulatory Visit: Payer: Self-pay | Admitting: Internal Medicine

## 2014-09-04 NOTE — Telephone Encounter (Signed)
Is the patient still to be taking this? He has not refilled it since April 2015. Please advise. Thanks, MI

## 2014-09-05 NOTE — Telephone Encounter (Signed)
He should not be taking this - I do not see where this patient should be on this medication. It is not currently in his medication list either. Should obtain prescription from provider that recently wrote for it  (if a provider did so). Thanks.

## 2014-09-17 ENCOUNTER — Other Ambulatory Visit: Payer: Self-pay | Admitting: Internal Medicine

## 2014-09-25 ENCOUNTER — Telehealth: Payer: Self-pay

## 2014-09-25 MED ORDER — FINASTERIDE 1 MG PO TABS: 1.0000 mg | ORAL_TABLET | Freq: Every day | ORAL | Status: DC

## 2014-09-25 NOTE — Telephone Encounter (Signed)
[facility] request for finasteride (PROPECIA) 1 MG tablet

## 2014-09-25 NOTE — Telephone Encounter (Signed)
Medication refilled

## 2014-09-26 ENCOUNTER — Telehealth: Payer: Self-pay | Admitting: Internal Medicine

## 2014-09-26 NOTE — Telephone Encounter (Signed)
Patient reports going in and out of rhythm for past two weeks.  Denies symptoms. Patient scheduled too see Roderic Palau, NP in AFib clinic next week to discuss. Patient verbalized understanding and agreeable to plan.

## 2014-09-26 NOTE — Telephone Encounter (Signed)
New Message  Pt feels his heart is out of rhythm and to speak w/ Rn about what to do going forward. Please call back and discuss.

## 2014-09-29 ENCOUNTER — Other Ambulatory Visit: Payer: Self-pay

## 2014-09-29 ENCOUNTER — Telehealth: Payer: Self-pay | Admitting: Family Medicine

## 2014-09-29 MED ORDER — FINASTERIDE 1 MG PO TABS: 1.0000 mg | ORAL_TABLET | Freq: Every day | ORAL | Status: DC

## 2014-09-29 NOTE — Telephone Encounter (Signed)
PA for finesteride was denied. Patient's plan will not cover medication for male pattern baldness.

## 2014-09-29 NOTE — Telephone Encounter (Signed)
Travis Belling do you know how much it will be out of pocket so i can tell pt when i call him?

## 2014-09-29 NOTE — Telephone Encounter (Signed)
See below

## 2014-09-29 NOTE — Telephone Encounter (Signed)
Please inform patient. Only way he can use this if pays out of pocket.

## 2014-09-29 NOTE — Telephone Encounter (Signed)
Called and left message on pt vm with below information and advised to contact his pharmacy for pricing

## 2014-09-29 NOTE — Telephone Encounter (Signed)
No. The pharmacy could tell him that.

## 2014-10-01 ENCOUNTER — Other Ambulatory Visit: Payer: Self-pay | Admitting: Family Medicine

## 2014-10-03 ENCOUNTER — Encounter (HOSPITAL_COMMUNITY): Payer: Self-pay | Admitting: Nurse Practitioner

## 2014-10-03 ENCOUNTER — Ambulatory Visit (HOSPITAL_COMMUNITY)
Admission: RE | Admit: 2014-10-03 | Discharge: 2014-10-03 | Disposition: A | Discharge status: Home / Self Care | Payer: Medicare Other | Source: Ambulatory Visit | Attending: Nurse Practitioner | Admitting: Nurse Practitioner

## 2014-10-03 VITALS — BP 130/72 | HR 57 | Ht 68.0 in | Wt 184.0 lb

## 2014-10-03 DIAGNOSIS — I480 Paroxysmal atrial fibrillation: Secondary | ICD-10-CM

## 2014-10-03 DIAGNOSIS — R9431 Abnormal electrocardiogram [ECG] [EKG]: Secondary | ICD-10-CM | POA: Diagnosis not present

## 2014-10-03 DIAGNOSIS — I4891 Unspecified atrial fibrillation: Secondary | ICD-10-CM | POA: Diagnosis not present

## 2014-10-03 NOTE — Progress Notes (Signed)
Patient ID: Travis [name], male   DOB: [birth year], 74 y.o.   MRN: [id number]  Primary Care Physician: Travis Reddish, MD Referring Physician:   JAMISEN [name] is a 74 y.o. male with a h/o afib with TCM, now resolved, with last echo in March showing normal EF, on Tikosyn, that is here today with episodes of afib over the last month. 3 weeks ago, he was out x 3 days and went back in spontaneously and then 1 week ago , also was in afib x 2 days. Ekg today shows SR. Is being compliant with Tikosyn. Labs checked and OK 2 months ago. No recent illness but did have both knees replaced 7 months ago and has not resumed usual walking. Did lose 15 lbs at time of surgery but has gained 15 back. Denies sleep apnea but does live alone. Drinks a martini a night. Has not tried to uptitrate BB with episodes of afib, he does report rapid rates.  Today, he denies symptoms of palpitations, chest pain, shortness of breath, orthopnea, PND, lower extremity edema, dizziness, presyncope, syncope, or neurologic sequela. The patient is tolerating medications without difficulties and is otherwise without complaint today.   Past Medical History  Diagnosis Date  . Arthritis   . Hypertension   . GERD (gastroesophageal reflux disease)   . Bicuspid aortic valve   . 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 Sinus bradycardia   . Paroxysmal atrial fibrillation   . Cardiomyopathy -resolved     tachycardia-induced, EF 55% June 2009  . History of cardioversion 12/16/2012  . Left bundle branch block   . CHF (congestive heart failure)     JULY 2014  . History of stomach ulcers    Past Surgical History  Procedure Laterality Date  . Cerebral anuersym post clips    . Rotator cuff repair      lf  . Fractured left arm    . Left tendon repair      lft foot  . Hernia repair      lft  . Wrist ganglion excision      lft  . Tonsillectomy    . Inguinal hernia repair  07/02/2011    Procedure: LAPAROSCOPIC INGUINAL HERNIA;   Surgeon: Harl Bowie, MD;  Location: [place];  Service: General;  Laterality: Left;  Laparoscopic left inguinal hernia repair and mesh  . Cardiac catheterization    . Cardioversion N/A 12/16/2012    Procedure: CARDIOVERSION;  Surgeon: Lelon Perla, MD;  Location: [facility] ENDOSCOPY;  Service: Cardiovascular;  Laterality: N/A;  . Total knee arthroplasty Bilateral 02/06/2014    Procedure: TOTAL KNEE BILATERAL;  Surgeon: Mauri Pole, MD;  Location: [facility];  Service: Orthopedics;  Laterality: Bilateral;    Current Outpatient Prescriptions  Medication Sig Dispense Refill  . acetaminophen (TYLENOL) 500 MG tablet Take 1,000 mg by mouth every 6 (six) hours as needed for mild pain or moderate pain.    Marland Kitchen amLODipine (NORVASC) 5 MG tablet TAKE 1 TABLET BY MOUTH DAILY 30 tablet 6  . b complex vitamins tablet Take 1 tablet by mouth daily.     . carvedilol (COREG) 3.125 MG tablet TAKE 1 TABLET BY MOUTH TWICE DAILY 60 tablet 2  . fish oil-omega-3 fatty acids 1000 MG capsule Take 1,000 mg by mouth daily.     . Flaxseed, Linseed, (RA FLAX SEED OIL 1000 PO) Take 1,000 mg by mouth 3 (three) times daily.     . meloxicam (MOBIC) 15  MG tablet TAKE 1 TABLET BY MOUTH DAILY 30 tablet 5  . omeprazole (PRILOSEC) 20 MG capsule TAKE ONE CAPSULE BY MOUTH EVERY DAY 30 capsule 1  . PRADAXA 150 MG CAPS capsule TAKE ONE CAPSULE BY MOUTH TWICE DAILY 60 capsule 10  . telmisartan (MICARDIS) 80 MG tablet TAKE 1 TABLET BY MOUTH AT BEDTIME 30 tablet 3  . temazepam (RESTORIL) 15 MG capsule TAKE ONE CAPSULE BY MOUTH AT BEDTIME AS NEEDED 30 capsule 5  . TIKOSYN 500 MCG capsule TAKE 1 CAPSULE BY MOUTH EVERY 12 HOURS 60 capsule 6  . finasteride (PROPECIA) 1 MG tablet Take 1 tablet (1 mg total) by mouth daily. 30 tablet 5   No current facility-administered medications for this encounter.    No Known Allergies  History   Social History  . Marital Status: Single    Spouse Name: N/A  . Number of Children: 0  . Years of  Education: N/A   Occupational History  . Retired    Social History Main Topics  . Smoking status: Former Smoker -- 1.50 packs/day for 25 years    Quit date: 05/26/1986  . Smokeless tobacco: Never Used  . Alcohol Use: 0.5 oz/week    1 drink(s) per week     Comment: 1 martini daily   . Drug Use: No  . Sexual Activity: Not Currently   Other Topics Concern  . Not on file   Social History Narrative   Homosexual. Lives alone. Not sexually active.       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 Stroke Father 61    smoker  . Lung cancer Mother 48    former smoker  . Stroke Mother   . Colon cancer Neg Hx   . Stroke Brother     ROS- All systems are reviewed and negative except as per the HPI above  Physical Exam: Filed Vitals:   10/03/14 0926  BP: 130/72  Pulse: 57  Height: 5\' 8"  (1.727 m)  Weight: 184 lb (83.462 kg)    GEN- The patient is well appearing, alert and oriented x 3 today.   Head- normocephalic, atraumatic Eyes-  Sclera clear, conjunctiva pink Ears- hearing intact Oropharynx- clear Neck- supple, no JVP Lymph- no cervical lymphadenopathy Lungs- Clear to ausculation bilaterally, normal work of breathing Heart- Regular rate and rhythm, no murmurs, rubs or gallops, PMI not laterally displaced GI- soft, NT, ND, + BS Extremities- no clubbing, cyanosis, or edema MS- no significant deformity or atrophy Skin- no rash or lesion Psych- euthymic mood, full affect Neuro- strength and sensation are intact  EKG-Sinus bradycardia, LAD, NSIVB, Lateral infarct, age undetermined. PR 146 ms, QRS 132 ms, QTc 447 ms. CBC Latest Ref Rng 03/06/2014 02/09/2014 02/08/2014  WBC 4.0 - 10.5 K/uL 6.9 8.0 10.4  Hemoglobin 13.0 - 17.0 g/dL 14.4 10.2(L) 10.9(L)  Hematocrit 39.0 - 52.0 % 43.9 28.7(L) 31.1(L)  Platelets 150.0 - 400.0 K/uL  279.0 147(L) 159   Lab Results  Component Value Date   CREATININE 0.72 08/15/2014   CREATININE 0.8 03/06/2014   CREATININE 0.56 02/09/2014    ECHO-Left ventricle: The cavity size was mildly dilated. Wall thickness was normal. Systolic function was normal. Wall motion was normal; there were no regional wall motion abnormalities. - Aortic valve: Cusp separation was reduced. There was mild stenosis. There  was mild regurgitation. Peak velocity (S): 246 cm/s. Mean gradient (S): 13 mm Hg. Peak gradient (S): 24 mm Hg. Valve area (VTI): 1.27 cm^2. Valve area (Vmean): 1.29 cm^2. - Mitral valve: Calcified annulus. Mildly thickened leaflets . There was mild regurgitation. - Left atrium: The atrium was severely dilated.( LA ID- 50 mm) - Right atrium: The atrium was moderately dilated. - Pulmonary arteries: Systolic pressure was moderately increased. PA peak pressure: 54 mm Hg (S). - Inferior vena cava: The vessel was dilated. The respirophasic diameter changes were in the normal range (= 50%), consistent with mildly elevated central venous pressure.   Impressions:  - Mildly advanced aortic stenosis when compared to prior echocardiogram.   Assessment and Plan: 1. PAF Continue Tikosyn Decrease alcohol intake to no more than 2 drinks a day Increase exercise Has bradycardia at baseline so hesitate to increase BB on a daily basis, but advised to double dose of BB to 6.125 mg bid when in rapid afib. If continues to have frequent breakthrough episodes, then will refer to [name] for consideration for ablation.  2. HTN Well controlled  3. TCM Resolved with normal EF by last echo

## 2014-10-03 NOTE — Patient Instructions (Signed)
Can take extra dose (3.125mg ) of coreg twice a day for afib as needed only  Follow up as scheduled

## 2014-10-13 ENCOUNTER — Other Ambulatory Visit: Payer: Self-pay | Admitting: Gastroenterology

## 2014-10-29 ENCOUNTER — Other Ambulatory Visit: Payer: Self-pay | Admitting: Family Medicine

## 2014-11-11 ENCOUNTER — Other Ambulatory Visit: Payer: Self-pay | Admitting: Gastroenterology

## 2014-11-11 ENCOUNTER — Other Ambulatory Visit: Payer: Self-pay | Admitting: Internal Medicine

## 2014-11-23 ENCOUNTER — Ambulatory Visit: Payer: Medicare Other | Admitting: Family Medicine

## 2014-11-28 ENCOUNTER — Ambulatory Visit: Payer: Medicare Other | Admitting: Family Medicine

## 2014-12-13 ENCOUNTER — Other Ambulatory Visit: Payer: Self-pay

## 2014-12-13 MED ORDER — OMEPRAZOLE 20 MG PO CPDR: DELAYED_RELEASE_CAPSULE | ORAL | Status: DC

## 2014-12-19 ENCOUNTER — Encounter: Payer: Self-pay | Admitting: Family Medicine

## 2014-12-19 ENCOUNTER — Ambulatory Visit (INDEPENDENT_AMBULATORY_CARE_PROVIDER_SITE_OTHER): Payer: Medicare Other | Admitting: Family Medicine

## 2014-12-19 VITALS — BP 140/78 | HR 51 | Temp 98.1°F | Wt 186.0 lb

## 2014-12-19 DIAGNOSIS — E785 Hyperlipidemia, unspecified: Secondary | ICD-10-CM

## 2014-12-19 DIAGNOSIS — I480 Paroxysmal atrial fibrillation: Secondary | ICD-10-CM

## 2014-12-19 DIAGNOSIS — Z23 Encounter for immunization: Secondary | ICD-10-CM | POA: Diagnosis not present

## 2014-12-19 DIAGNOSIS — I10 Essential (primary) hypertension: Secondary | ICD-10-CM

## 2014-12-19 MED ORDER — AMLODIPINE BESYLATE 10 MG PO TABS: 10.0000 mg | ORAL_TABLET | Freq: Every day | ORAL | Status: DC

## 2014-12-19 NOTE — Patient Instructions (Addendum)
Schedule a lab visit at the front desk for tomorrow morning. Return for future fasting labs. Nothing but water after midnight please.   See me in 4-6 weeks for blood pressure recheck. Increase amlodipine to 10mg - sent this in- can take 2 of your 5mg  tabs until you run out.   Agree with working on shedding a few lbs. Probably wouldn't get below 170 or 175 though.

## 2014-12-19 NOTE — Assessment & Plan Note (Signed)
S: Poor control SBP on 2 of last 3 visits on Amlodipine 5mg , carvedilol 3.125 mg BID, telmisartan 80mg . Exercise had been down but restarted.  BP Readings from Last 3 Encounters:  12/19/14 140/78  10/03/14 130/72  08/14/14 152/66  A/P: increase amlodipine to 10mg . Follow up 4-6 weeks.

## 2014-12-19 NOTE — Assessment & Plan Note (Signed)
S: no rx. Exercises regularly A/P: check lipids and calcualte 10 year risk- consider statin.

## 2014-12-19 NOTE — Progress Notes (Signed)
[name], MD  Subjective:  Travis [name] is a 73 y.o. year old very pleasant male patient who presents with:  See problem oriented charting ROS- no chest pain, shortness of breath, palpitations, headache, blurry vision  Past Medical History- bicuspid aortic valve, cardiomyopathy, a fib paroxysmal, pulmonary HTN, HTN, HLD  Medications- reviewed and updated Current Outpatient Prescriptions  Medication Sig Dispense Refill  . amLODipine (NORVASC) 5 MG tablet TAKE 1 TABLET BY MOUTH DAILY 30 tablet 6  . b complex vitamins tablet Take 1 tablet by mouth daily.     . carvedilol (COREG) 3.125 MG tablet TAKE 1 TABLET BY MOUTH TWICE DAILY 60 tablet 9  . finasteride (PROPECIA) 1 MG tablet Take 1 tablet (1 mg total) by mouth daily. 30 tablet 5  . fish oil-omega-3 fatty acids 1000 MG capsule Take 1,000 mg by mouth daily.     . Flaxseed, Linseed, (RA FLAX SEED OIL 1000 PO) Take 1,000 mg by mouth 3 (three) times daily.     . meloxicam (MOBIC) 15 MG tablet TAKE 1 TABLET BY MOUTH DAILY 30 tablet 5  . omeprazole (PRILOSEC) 20 MG capsule TAKE 1 CAPSULE BY MOUTH EVERY DAY 30 capsule 1  . PRADAXA 150 MG CAPS capsule TAKE ONE CAPSULE BY MOUTH TWICE DAILY 60 capsule 10  . telmisartan (MICARDIS) 80 MG tablet TAKE 1 TABLET BY MOUTH AT BEDTIME 30 tablet 5  . temazepam (RESTORIL) 15 MG capsule TAKE 1 CAPSULE BY MOUTH EVERY DAY AT BEDTIME AS NEEDED 30 capsule 5  . TIKOSYN 500 MCG capsule TAKE 1 CAPSULE BY MOUTH EVERY 12 HOURS 60 capsule 6  . acetaminophen (TYLENOL) 500 MG tablet Take 1,000 mg by mouth every 6 (six) hours as needed for mild pain or moderate pain.     Objective: BP 140/78 mmHg  Pulse 51  Temp(Src) 98.1 F (36.7 C)  Wt 186 lb (84.369 kg) Gen: NAD, resting comfortably CV: RRR 2/6 SEM (known bicuspid valve) no rubs or gallops Lungs: CTAB no crackles, wheeze, rhonchi Abdomen: soft/nontender/nondistended/normal bowel sounds. No rebound or guarding.  Ext: no edema Skin: warm, dry, no  rash Neuro: grossly normal, moves all extremities, normal gait   Assessment/Plan:  Hypertension S: Poor control SBP on 2 of last 3 visits on Amlodipine 5mg , carvedilol 3.125 mg BID, telmisartan 80mg . Exercise had been down but restarted.  BP Readings from Last 3 Encounters:  12/19/14 140/78  10/03/14 130/72  08/14/14 152/66  A/P: increase amlodipine to 10mg . Follow up 4-6 weeks.    Paroxysmal atrial fibrillation S:Exercise slowed from 5-6 days a week in February. Recurrence of a fib when not exercising as much. Has started his increased exercise back and has done well. Asymptomatic. Compliant with Alcohol 2 per day max A/P: continue tikosyn and pradaxa. Tolerating increased exercise back to baseline and should continue. Also on low dose coreg. Continue reduced alcohol consumption   Hyperlipidemia S: no rx. Exercises regularly A/P: check lipids and calcualte 10 year risk- consider statin.    6 week.   Return for updated labs tomorrow  Orders Placed This Encounter  Procedures  . Pneumococcal conjugate vaccine 13-valent  . Comprehensive metabolic panel    Standing Status: Future     Number of Occurrences:      Standing Expiration Date: 12/19/2015  . CBC    Standing Status: Future     Number of Occurrences:      Standing Expiration Date: 12/19/2015  . Lipid panel    Standing Status: Future  Number of Occurrences:      Standing Expiration Date: 12/19/2015    Meds ordered this encounter  Medications  . amLODipine (NORVASC) 10 MG tablet    Sig: Take 1 tablet (10 mg total) by mouth daily.    Dispense:  30 tablet    Refill:  5

## 2014-12-19 NOTE — Assessment & Plan Note (Addendum)
S:Exercise slowed from 5-6 days a week in February. Recurrence of a fib when not exercising as much. Has started his increased exercise back and has done well. Asymptomatic. Compliant with Alcohol 2 per day max A/P: continue tikosyn and pradaxa. Tolerating increased exercise back to baseline and should continue. Also on low dose coreg. Continue reduced alcohol consumption

## 2014-12-20 ENCOUNTER — Other Ambulatory Visit (INDEPENDENT_AMBULATORY_CARE_PROVIDER_SITE_OTHER): Payer: Medicare Other

## 2014-12-20 DIAGNOSIS — E785 Hyperlipidemia, unspecified: Secondary | ICD-10-CM

## 2014-12-20 DIAGNOSIS — I10 Essential (primary) hypertension: Secondary | ICD-10-CM

## 2014-12-20 LAB — COMPREHENSIVE METABOLIC PANEL
ALT: 14 U/L (ref 0–53)
AST: 21 U/L (ref 0–37)
Albumin: 4.2 g/dL (ref 3.5–5.2)
Alkaline Phosphatase: 73 U/L (ref 39–117)
BUN: 13 mg/dL (ref 6–23)
CO2: 27 mEq/L (ref 19–32)
CREATININE: 0.78 mg/dL (ref 0.40–1.50)
Calcium: 9.3 mg/dL (ref 8.4–10.5)
Chloride: 100 mEq/L (ref 96–112)
GFR: 103.25 mL/min (ref >60.00)
GLUCOSE: 102 mg/dL — AB (ref 70–99)
Potassium: 4.8 mEq/L (ref 3.5–5.1)
Sodium: 135 mEq/L (ref 135–145)
Total Bilirubin: 0.6 mg/dL (ref 0.2–1.2)
Total Protein: 7 g/dL (ref 6.0–8.3)

## 2014-12-20 LAB — CBC
HEMATOCRIT: 48.2 % (ref 39.0–52.0)
HEMOGLOBIN: 16.4 g/dL (ref 13.0–17.0)
MCHC: 34.1 g/dL (ref 30.0–36.0)
MCV: 100.7 fl — ABNORMAL HIGH (ref 78.0–100.0)
Platelets: 184 10*3/uL (ref 150.0–400.0)
RBC: 4.79 Mil/uL (ref 4.22–5.81)
RDW: 14.9 % (ref 11.5–15.5)
WBC: 6.7 10*3/uL (ref 4.0–10.5)

## 2014-12-20 LAB — LIPID PANEL
CHOLESTEROL: 216 mg/dL — AB (ref 0–200)
HDL: 69.4 mg/dL (ref >39.00)
LDL Cholesterol: 128 mg/dL — ABNORMAL HIGH (ref 0–99)
NONHDL: 146.6
Total CHOL/HDL Ratio: 3
Triglycerides: 93 mg/dL (ref 0.0–149.0)
VLDL: 18.6 mg/dL (ref 0.0–40.0)

## 2015-01-23 ENCOUNTER — Other Ambulatory Visit: Payer: Self-pay | Admitting: Family Medicine

## 2015-01-30 ENCOUNTER — Ambulatory Visit (INDEPENDENT_AMBULATORY_CARE_PROVIDER_SITE_OTHER): Payer: Medicare Other | Admitting: Family Medicine

## 2015-01-30 ENCOUNTER — Encounter: Payer: Self-pay | Admitting: Family Medicine

## 2015-01-30 VITALS — BP 136/72 | HR 50 | Temp 97.8°F | Wt 187.0 lb

## 2015-01-30 DIAGNOSIS — I10 Essential (primary) hypertension: Secondary | ICD-10-CM | POA: Diagnosis not present

## 2015-01-30 DIAGNOSIS — Z23 Encounter for immunization: Secondary | ICD-10-CM | POA: Diagnosis not present

## 2015-01-30 DIAGNOSIS — E785 Hyperlipidemia, unspecified: Secondary | ICD-10-CM | POA: Diagnosis not present

## 2015-01-30 NOTE — Assessment & Plan Note (Signed)
S: controlled. With recent increase to 10mg  amlodipine. Home readings 120s and 130s with 1-2x a month just above 140 BP Readings from Last 3 Encounters:  01/30/15 136/72  12/19/14 140/78  10/03/14 130/72  A/P:Continue current meds:  Change home monitoring to 3x a week from daily. Goal <140/90 as overall health is good for age

## 2015-01-30 NOTE — Progress Notes (Signed)
Travis Reddish, MD  Subjective:  Travis [name] is a 74 y.o. year old very pleasant male patient who presents for/with See problem oriented charting ROS- no chest pain or shortness of breath. Did have one day with vertigo for a few minutes with no recurrence. No extremity weakness or facial weakness. No headache or blurry vision  Past Medical History-  Patient Active Problem List   Diagnosis Date Noted  . Pulmonary hypertension 03/09/2012    Priority: High  . Bicuspid aortic valve     Priority: High  . Paroxysmal atrial fibrillation     Priority: High  . Cardiomyopathy, rate related with resolution now recurrent 12/08/2011    Priority: High  . Hyperlipidemia 12/19/2014    Priority: Medium  . Anxiety state 03/06/2014    Priority: Medium  . Insomnia 03/06/2014    Priority: Medium  . Expected blood loss anemia 02/08/2014    Priority: Medium  . Sinus bradycardia 01/04/2013    Priority: Medium  . Hyponatremia 02/21/2012    Priority: Medium  . Hypertension     Priority: Medium  . Left bundle branch block 12/10/2010    Priority: Medium  . Male pattern baldness 03/06/2014    Priority: Low  . Overweight (BMI 25.0-29.9) 02/08/2014    Priority: Low  . S/P TKR (total knee replacement) 02/08/2014    Priority: Low  . DRY MOUTH 11/07/2009    Priority: Low  . Carotid bruit 07/05/2009    Priority: Low  . GERD 01/07/2008    Priority: Low  . Osteoarthritis 02/04/2007    Priority: Low  . GANGLION CYST, WRIST, LEFT 02/04/2007    Priority: Low    Medications- reviewed and updated Current Outpatient Prescriptions  Medication Sig Dispense Refill  . acetaminophen (TYLENOL) 500 MG tablet Take 1,000 mg by mouth every 6 (six) hours as needed for mild pain or moderate pain.    Marland Kitchen amLODipine (NORVASC) 10 MG tablet Take 1 tablet (10 mg total) by mouth daily. 30 tablet 5  . b complex vitamins tablet Take 1 tablet by mouth daily.     . carvedilol (COREG) 3.125 MG tablet TAKE 1 TABLET BY MOUTH  TWICE DAILY 60 tablet 9  . finasteride (PROPECIA) 1 MG tablet Take 1 tablet (1 mg total) by mouth daily. 30 tablet 5  . fish oil-omega-3 fatty acids 1000 MG capsule Take 1,000 mg by mouth daily.     . Flaxseed, Linseed, (RA FLAX SEED OIL 1000 PO) Take 1,000 mg by mouth 3 (three) times daily.     . meloxicam (MOBIC) 15 MG tablet TAKE 1 TABLET BY MOUTH DAILY 90 tablet 3  . omeprazole (PRILOSEC) 20 MG capsule TAKE 1 CAPSULE BY MOUTH EVERY DAY 30 capsule 1  . PRADAXA 150 MG CAPS capsule TAKE ONE CAPSULE BY MOUTH TWICE DAILY 60 capsule 10  . telmisartan (MICARDIS) 80 MG tablet TAKE 1 TABLET BY MOUTH AT BEDTIME 30 tablet 5  . TIKOSYN 500 MCG capsule TAKE 1 CAPSULE BY MOUTH EVERY 12 HOURS 60 capsule 6  . temazepam (RESTORIL) 15 MG capsule TAKE 1 CAPSULE BY MOUTH EVERY DAY AT BEDTIME AS NEEDED (Patient not taking: Reported on 01/30/2015) 30 capsule 5   No current facility-administered medications for this visit.    Objective: BP 136/72 mmHg  Pulse 50  Temp(Src) 97.8 F (36.6 C)  Wt 187 lb (84.823 kg) Gen: NAD, resting comfortably CV: RRR 3/6 SEM no rubs or gallops Lungs: CTAB no crackles, wheeze, rhonchi Abdomen: soft/nontender/nondistended/normal bowel sounds.  No rebound or guarding.  Ext: trace edema Skin: warm, dry, no rash Neuro: grossly normal, moves all extremities  Assessment/Plan:  Hypertension S: controlled. With recent increase to 10mg  amlodipine. Home readings 120s and 130s with 1-2x a month just above 140 BP Readings from Last 3 Encounters:  01/30/15 136/72  12/19/14 140/78  10/03/14 130/72  A/P:Continue current meds:  Change home monitoring to 3x a week from daily. Goal <140/90 as overall health is good for age   Hyperlipidemia S: poorly controlled.  Lipid Panel     Component Value Date/Time   CHOL 216* 12/20/2014 0818   TRIG 93.0 12/20/2014 0818   HDL 69.40 12/20/2014 0818   CHOLHDL 3 12/20/2014 0818   VLDL 18.6 12/20/2014 0818   LDLCALC 128* 12/20/2014 0818    LDLDIRECT 128.1 02/09/2013 1004  A/P:discussed risk >20% primarily driven by age. Patient does not want to take statin for primary prevention but would be willing to consider for secondary prevention- aware of risks going both ways  6 month f/u   Orders Placed This Encounter  Procedures  . Flu Vaccine QUAD 36+ mos IM

## 2015-01-30 NOTE — Patient Instructions (Signed)
You opted to hold off on cholesterol medicine  Blood pressure looks better. Let's continue higher dose amlodipine at 10mg   Follow up 6 months

## 2015-01-30 NOTE — Assessment & Plan Note (Signed)
S: poorly controlled.  Lipid Panel     Component Value Date/Time   CHOL 216* 12/20/2014 0818   TRIG 93.0 12/20/2014 0818   HDL 69.40 12/20/2014 0818   CHOLHDL 3 12/20/2014 0818   VLDL 18.6 12/20/2014 0818   LDLCALC 128* 12/20/2014 0818   LDLDIRECT 128.1 02/09/2013 1004  A/P:discussed risk >20% primarily driven by age. Patient does not want to take statin for primary prevention but would be willing to consider for secondary prevention- aware of risks going both ways

## 2015-02-05 ENCOUNTER — Other Ambulatory Visit: Payer: Self-pay | Admitting: Gastroenterology

## 2015-02-10 ENCOUNTER — Other Ambulatory Visit: Payer: Self-pay | Admitting: Internal Medicine

## 2015-02-19 ENCOUNTER — Encounter (HOSPITAL_COMMUNITY): Payer: Self-pay | Admitting: Nurse Practitioner

## 2015-02-19 ENCOUNTER — Ambulatory Visit (HOSPITAL_COMMUNITY)
Admission: RE | Admit: 2015-02-19 | Discharge: 2015-02-19 | Disposition: A | Discharge status: Home / Self Care | Payer: Medicare Other | Source: Ambulatory Visit | Attending: Nurse Practitioner | Admitting: Nurse Practitioner

## 2015-02-19 VITALS — BP 138/78 | HR 58 | Ht 68.0 in | Wt 184.6 lb

## 2015-02-19 DIAGNOSIS — Z87891 Personal history of nicotine dependence: Secondary | ICD-10-CM | POA: Diagnosis not present

## 2015-02-19 DIAGNOSIS — I509 Heart failure, unspecified: Secondary | ICD-10-CM | POA: Insufficient documentation

## 2015-02-19 DIAGNOSIS — Q231 Congenital insufficiency of aortic valve: Secondary | ICD-10-CM | POA: Insufficient documentation

## 2015-02-19 DIAGNOSIS — I481 Persistent atrial fibrillation: Secondary | ICD-10-CM | POA: Diagnosis not present

## 2015-02-19 DIAGNOSIS — I10 Essential (primary) hypertension: Secondary | ICD-10-CM | POA: Diagnosis not present

## 2015-02-19 DIAGNOSIS — K219 Gastro-esophageal reflux disease without esophagitis: Secondary | ICD-10-CM | POA: Diagnosis not present

## 2015-02-19 DIAGNOSIS — Z79899 Other long term (current) drug therapy: Secondary | ICD-10-CM | POA: Diagnosis not present

## 2015-02-19 DIAGNOSIS — I4819 Other persistent atrial fibrillation: Secondary | ICD-10-CM

## 2015-02-19 DIAGNOSIS — Z823 Family history of stroke: Secondary | ICD-10-CM | POA: Diagnosis not present

## 2015-02-19 DIAGNOSIS — I480 Paroxysmal atrial fibrillation: Secondary | ICD-10-CM | POA: Diagnosis not present

## 2015-02-19 LAB — BASIC METABOLIC PANEL
Anion gap: 6 (ref 5–15)
BUN: 10 mg/dL (ref 6–20)
CO2: 28 mmol/L (ref 22–32)
Calcium: 9.3 mg/dL (ref 8.9–10.3)
Chloride: 100 mmol/L — ABNORMAL LOW (ref 101–111)
Creatinine, Ser: 0.8 mg/dL (ref 0.61–1.24)
Glucose, Bld: 128 mg/dL — ABNORMAL HIGH (ref 65–99)
Potassium: 4.8 mmol/L (ref 3.5–5.1)
Sodium: 134 mmol/L — ABNORMAL LOW (ref 135–145)

## 2015-02-19 LAB — MAGNESIUM: Magnesium: 2 mg/dL (ref 1.7–2.4)

## 2015-02-19 NOTE — Progress Notes (Signed)
Patient ID: Travis [name], male   DOB: [birth year], 74 y.o.   MRN: [id number]     Primary Care Physician: [name], MD Referring Physician: Dr. Nance Pear is a 74 y.o. male with a h/o afib with TCM, now resolved, with last echo in March showing normal EF, on Tikosyn, that is here today for f/u. He reports no afib over the last 3 months. Since his knee replacement, he was able to resume exercise and has not had any issues with afib, like he did around time of knee surgery. He feels well. He works out with a Clinical research associate 3x a week and exercises on his own as least 30 minutes during other days.   Today, he denies symptoms of palpitations, chest pain, shortness of breath, orthopnea, PND, lower extremity edema, dizziness, presyncope, syncope, or neurologic sequela. The patient is tolerating medications without difficulties and is otherwise without complaint today.   Past Medical History  Diagnosis Date  . Arthritis   . Hypertension   . GERD (gastroesophageal reflux disease)   . Bicuspid aortic valve   . 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 Sinus bradycardia   . Paroxysmal atrial fibrillation   . Cardiomyopathy -resolved     tachycardia-induced, EF 55% June 2009  . History of cardioversion 12/16/2012  . Left bundle branch block   . CHF (congestive heart failure)     JULY 2014  . History of stomach ulcers    Past Surgical History  Procedure Laterality Date  . Cerebral anuersym post clips    . Rotator cuff repair      lf  . Fractured left arm    . Left tendon repair      lft foot  . Hernia repair      lft  . Wrist ganglion excision      lft  . Tonsillectomy    . Inguinal hernia repair  07/02/2011    Procedure: LAPAROSCOPIC INGUINAL HERNIA;  Surgeon: Harl Bowie, MD;  Location: [place];  Service: General;  Laterality: Left;  Laparoscopic left inguinal hernia repair and mesh  . Cardiac catheterization    . Cardioversion N/A 12/16/2012    Procedure:  CARDIOVERSION;  Surgeon: Lelon Perla, MD;  Location: [facility] END[facility]COPY;  Service: Cardiovascular;  Laterality: N/A;  . Total knee arthroplasty Bilateral 02/06/2014    Procedure: TOTAL KNEE BILATERAL;  Surgeon: Mauri Pole, MD;  Location: [facility];  Service: Orthopedics;  Laterality: Bilateral;    Current Outpatient Prescriptions  Medication Sig Dispense Refill  . acetaminophen (TYLENOL) 500 MG tablet Take 1,000 mg by mouth every 6 (six) hours as needed for mild pain or moderate pain.    Marland Kitchen amLODipine (NORVASC) 10 MG tablet Take 1 tablet (10 mg total) by mouth daily. 30 tablet 5  . b complex vitamins tablet Take 1 tablet by mouth daily.     . carvedilol (COREG) 3.125 MG tablet TAKE 1 TABLET BY MOUTH TWICE DAILY 60 tablet 9  . dofetilide (TIKOSYN) 500 MCG capsule TAKE ONE CAPSULE BY MOUTH EVERY 12 HOURS 180 capsule 1  . finasteride (PROPECIA) 1 MG tablet Take 1 tablet (1 mg total) by mouth daily. 30 tablet 5  . fish oil-omega-3 fatty acids 1000 MG capsule Take 1,000 mg by mouth daily.     . Flaxseed, Linseed, (RA FLAX SEED OIL 1000 PO) Take 1,000 mg by mouth 3 (three) times daily.     . meloxicam (MOBIC) 15 MG tablet  TAKE 1 TABLET BY MOUTH DAILY 90 tablet 3  . omeprazole (PRILOSEC) 20 MG capsule TAKE 1 CAPSULE BY MOUTH EVERY DAY 90 capsule 0  . PRADAXA 150 MG CAPS capsule TAKE ONE CAPSULE BY MOUTH TWICE DAILY 60 capsule 10  . telmisartan (MICARDIS) 80 MG tablet TAKE 1 TABLET BY MOUTH AT BEDTIME 30 tablet 5  . temazepam (RESTORIL) 15 MG capsule TAKE 1 CAPSULE BY MOUTH EVERY DAY AT BEDTIME AS NEEDED 30 capsule 5   No current facility-administered medications for this encounter.    No Known Allergies  Social History   Social History  . Marital Status: Single    Spouse Name: N/A  . Number of Children: 0  . Years of Education: N/A   Occupational History  . Retired    Social History Main Topics  . Smoking status: Former Smoker -- 1.50 packs/day for 25 years    Quit date: 05/26/1986  .  Smokeless tobacco: Never Used  . Alcohol Use: 0.5 oz/week    1 drink(s) per week     Comment: 1 martini daily   . Drug Use: No  . Sexual Activity: Not Currently   Other Topics Concern  . Not on file   Social History Narrative   Homosexual. Lives alone. Not sexually active.       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 Stroke Father 70    smoker  . Lung cancer Mother 62    former smoker  . Stroke Mother   . Colon cancer Neg Hx   . Stroke Brother     ROS- All systems are reviewed and negative except as per the HPI above  Physical Exam: Filed Vitals:   02/19/15 0856  BP: 138/78  Pulse: 58  Height: 5\' 8"  (1.727 m)  Weight: 184 lb 9.6 oz (83.734 kg)    GEN- The patient is well appearing, alert and oriented x 3 today.   Head- normocephalic, atraumatic Eyes-  Sclera clear, conjunctiva pink Ears- hearing intact Oropharynx- clear Neck- supple, no JVP Lymph- no cervical lymphadenopathy Lungs- Clear to ausculation bilaterally, normal work of breathing Heart- Regular rate and rhythm, no murmurs, rubs or gallops, PMI not laterally displaced GI- soft, NT, ND, + BS Extremities- no clubbing, cyanosis, or edema MS- no significant deformity or atrophy Skin- no rash or lesion Psych- euthymic mood, full affect Neuro- strength and sensation are intact  EKG- Sinus brady at 58 bpm, LAD,NSIVB, Pr int 168 ms, QRS 132 ms, QTc 422 ms.   ECHO-Left ventricle: The cavity size was mildly dilated. Wall thickness was normal. Systolic function was normal. Wall motion was normal; there were no regional wall motion abnormalities. - Aortic valve: Cusp separation was reduced. There was mild stenosis. There was mild regurgitation. Peak velocity (S): 246 cm/s. Mean gradient (S): 13 mm Hg. Peak gradient (S): 24 mm Hg. Valve area (VTI): 1.27  cm^2. Valve area (Vmean): 1.29 cm^2. - Mitral valve: Calcified annulus. Mildly thickened leaflets . There was mild regurgitation. - Left atrium: The atrium was severely dilated.( LA ID- 50 mm) - Right atrium: The atrium was moderately dilated. - Pulmonary arteries: Systolic pressure was moderately increased. PA peak pressure: 54 mm Hg (S). - Inferior vena cava: The vessel was dilated. The respirophasic diameter changes were in the normal range (= 50%), consistent with mildly  elevated central venous pressure.   Impressions:  - Mildly advanced aortic stenosis when compared to prior echocardiogram.   Assessment and Plan:  1. PAF Continue Tikosyn Alcohol intake to no more than 2 drinks a day Continue regular exercise Bmet/mag today  2. HTN Well controlled  3. TCM Resolved with normal EF by last echo  F/u afib clinic 3 months [name] as scheduled in 6 months   [name]. [place], [facility] [address] [place], [place] [address] [phone]

## 2015-02-19 NOTE — Patient Instructions (Signed)
Code for garage [place]

## 2015-03-21 ENCOUNTER — Other Ambulatory Visit: Payer: Self-pay | Admitting: Family Medicine

## 2015-04-22 ENCOUNTER — Other Ambulatory Visit: Payer: Self-pay | Admitting: Family Medicine

## 2015-04-25 ENCOUNTER — Other Ambulatory Visit: Payer: Self-pay | Admitting: Family Medicine

## 2015-04-25 NOTE — Telephone Encounter (Signed)
Yes thanks 

## 2015-04-25 NOTE — Telephone Encounter (Signed)
Refill ok? 

## 2015-05-03 DIAGNOSIS — H2513 Age-related nuclear cataract, bilateral: Secondary | ICD-10-CM | POA: Diagnosis not present

## 2015-05-03 DIAGNOSIS — H25013 Cortical age-related cataract, bilateral: Secondary | ICD-10-CM | POA: Diagnosis not present

## 2015-05-05 ENCOUNTER — Other Ambulatory Visit: Payer: Self-pay | Admitting: Gastroenterology

## 2015-05-16 DIAGNOSIS — H25011 Cortical age-related cataract, right eye: Secondary | ICD-10-CM | POA: Diagnosis not present

## 2015-05-16 DIAGNOSIS — H2511 Age-related nuclear cataract, right eye: Secondary | ICD-10-CM | POA: Diagnosis not present

## 2015-05-24 ENCOUNTER — Ambulatory Visit (HOSPITAL_COMMUNITY)
Admission: RE | Admit: 2015-05-24 | Discharge: 2015-05-24 | Disposition: A | Discharge status: Home / Self Care | Payer: Medicare Other | Source: Ambulatory Visit | Attending: Nurse Practitioner | Admitting: Nurse Practitioner

## 2015-05-24 ENCOUNTER — Encounter (HOSPITAL_COMMUNITY): Payer: Self-pay | Admitting: Nurse Practitioner

## 2015-05-24 VITALS — BP 148/84 | HR 56 | Ht 68.0 in | Wt 186.8 lb

## 2015-05-24 DIAGNOSIS — I481 Persistent atrial fibrillation: Secondary | ICD-10-CM | POA: Diagnosis not present

## 2015-05-24 DIAGNOSIS — I4819 Other persistent atrial fibrillation: Secondary | ICD-10-CM

## 2015-05-24 LAB — BASIC METABOLIC PANEL
Anion gap: 7 (ref 5–15)
BUN: 16 mg/dL (ref 6–20)
CALCIUM: 9 mg/dL (ref 8.9–10.3)
CO2: 26 mmol/L (ref 22–32)
Chloride: 100 mmol/L — ABNORMAL LOW (ref 101–111)
Creatinine, Ser: 0.8 mg/dL (ref 0.61–1.24)
Glucose, Bld: 120 mg/dL — ABNORMAL HIGH (ref 65–99)
Potassium: 4.5 mmol/L (ref 3.5–5.1)
Sodium: 133 mmol/L — ABNORMAL LOW (ref 135–145)

## 2015-05-24 LAB — MAGNESIUM: Magnesium: 2 mg/dL (ref 1.7–2.4)

## 2015-05-24 NOTE — Progress Notes (Signed)
Patient ID: Travis [name], male   DOB: 04-16-1941, 74 y.o.   MRN: [id number]     Primary Care Physician: Travis Reddish, MD Referring Physician: Dr. Nance [name] is a 74 y.o. male with a h/o afib on tikosyn. He reports doing well over the last few months without any evidence of afib. He is compliant with tikosyn and with pradaxa.  Today, he denies symptoms of palpitations, chest pain, shortness of breath, orthopnea, PND, lower extremity edema, dizziness, presyncope, syncope, or neurologic sequela. The patient is tolerating medications without difficulties and is otherwise without complaint today.   Past Medical History  Diagnosis Date  . Arthritis   . Hypertension   . GERD (gastroesophageal reflux disease)   . Bicuspid aortic valve   . 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 Sinus bradycardia   . Paroxysmal atrial fibrillation (HCC)   . Cardiomyopathy -resolved     tachycardia-induced, EF 55% June 2009  . History of cardioversion 12/16/2012  . Left bundle branch block   . CHF (congestive heart failure) ([place])     JULY 2014  . History of stomach ulcers    Past Surgical History  Procedure Laterality Date  . Cerebral anuersym post clips    . Rotator cuff repair      lf  . Fractured left arm    . Left tendon repair      lft foot  . Hernia repair      lft  . Wrist ganglion excision      lft  . Tonsillectomy    . Inguinal hernia repair  07/02/2011    Procedure: LAPAROSCOPIC INGUINAL HERNIA;  Surgeon: Travis Bowie, MD;  Location: [place];  Service: General;  Laterality: Left;  Laparoscopic left inguinal hernia repair and mesh  . Cardiac catheterization    . Cardioversion N/A 12/16/2012    Procedure: CARDIOVERSION;  Surgeon: Travis Perla, MD;  Location[facility] ENDOSCOPY;  Service: Cardiovascular;  Laterality: N/A;  . Total knee arthroplasty Bilateral 02/06/2014    Procedure: TOTAL KNEE BILATERAL;  Surgeon: Travis Pole, MD;  [facility];  Service:  Orthopedics;  Laterality: Bilateral;    Current Outpatient Prescriptions  Medication Sig Dispense Refill  . acetaminophen (TYLENOL) 500 MG tablet Take 1,000 mg by mouth every 6 (six) hours as needed for mild pain or moderate pain.    Marland Kitchen amLODipine (NORVASC) 10 MG tablet Take 1 tablet (10 mg total) by mouth daily. 30 tablet 5  . b complex vitamins tablet Take 1 tablet by mouth daily.     . carvedilol (COREG) 3.125 MG tablet TAKE 1 TABLET BY MOUTH TWICE DAILY 60 tablet 9  . dofetilide (TIKOSYN) 500 MCG capsule TAKE ONE CAPSULE BY MOUTH EVERY 12 HOURS 180 capsule 1  . finasteride (PROPECIA) 1 MG tablet TAKE 1 TABLET(1 MG) BY MOUTH DAILY 30 tablet 5  . fish oil-omega-3 fatty acids 1000 MG capsule Take 1,000 mg by mouth daily.     . Flaxseed, Linseed, (RA FLAX SEED OIL 1000 PO) Take 1,000 mg by mouth 3 (three) times daily.     . meloxicam (MOBIC) 15 MG tablet TAKE 1 TABLET BY MOUTH DAILY 90 tablet 3  . omeprazole (PRILOSEC) 20 MG capsule TAKE ONE CAPSULE BY MOUTH EVERY DAY 90 capsule 0  . PRADAXA 150 MG CAPS capsule TAKE ONE CAPSULE BY MOUTH TWICE DAILY 60 capsule 10  . telmisartan (MICARDIS) 80 MG tablet TAKE 1 TABLET BY MOUTH EVERY  NIGHT AT BEDTIME 30 tablet 5  . temazepam (RESTORIL) 15 MG capsule TAKE 1 CAPSULE BY MOUTH EVERY DAY AT BEDTIME AS NEEDED 30 capsule 5   No current facility-administered medications for this encounter.    No Known Allergies  Social History   Social History  . Marital Status: Single    Spouse Name: N/A  . Number of Children: 0  . Years of Education: N/A   Occupational History  . Retired    Social History Main Topics  . Smoking status: Former Smoker -- 1.50 packs/day for 25 years    Quit date: 05/26/1986  . Smokeless tobacco: Never Used  . Alcohol Use: 0.5 oz/week    1 drink(s) per week     Comment: 1 martini daily   . Drug Use: No  . Sexual Activity: Not Currently   Other Topics Concern  . Not on file   Social History Narrative   Homosexual.  Lives alone. Not sexually active.       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 Stroke Father 57    smoker  . Lung cancer Mother 46    former smoker  . Stroke Mother   . Colon cancer Neg Hx   . Stroke Brother     ROS- All systems are reviewed and negative except as per the HPI above  Physical Exam: Filed Vitals:   05/24/15 0924  BP: 148/84  Pulse: 56  Height: 5\' 8"  (1.727 m)  Weight: 186 lb 12.8 oz (84.732 kg)    GEN- The patient is well appearing, alert and oriented x 3 today.   Head- normocephalic, atraumatic Eyes-  Sclera clear, conjunctiva pink Ears- hearing intact Oropharynx- clear Neck- supple, no JVP Lymph- no cervical lymphadenopathy Lungs- Clear to ausculation bilaterally, normal work of breathing Heart- Regular rate and rhythm, no murmurs, rubs or gallops, PMI not laterally displaced GI- soft, NT, ND, + BS Extremities- no clubbing, cyanosis, or edema MS- no significant deformity or atrophy Skin- no rash or lesion Psych- euthymic mood, full affect Neuro- strength and sensation are intact  EKG- Sinus brady at 56 bpm with PAC's, LBBB, pr int 158 ms, qrs int 126 ms, qtc 441 ms. Epic records reviewed  Assessment and Plan: 1. Afib Staying in SR with tiksoyn Continue 500 mg bid with stable QT Continue pradaxa, warned re possibility of GI bleed taking meloxicam on a daily basis Pt states he has taken for years and no issues. Is also on a PPI. Bmet/mag today  Scheduled to see [name] in March F/u in the afib clinic in 6 months  Travis [name]. Travis [name], [facility] [address] [place], [place] [address] [phone]

## 2015-05-27 HISTORY — PX: CATARACT EXTRACTION: SUR2

## 2015-06-01 DIAGNOSIS — H25011 Cortical age-related cataract, right eye: Secondary | ICD-10-CM | POA: Diagnosis not present

## 2015-06-01 DIAGNOSIS — H2512 Age-related nuclear cataract, left eye: Secondary | ICD-10-CM | POA: Diagnosis not present

## 2015-06-01 DIAGNOSIS — H25012 Cortical age-related cataract, left eye: Secondary | ICD-10-CM | POA: Diagnosis not present

## 2015-06-01 DIAGNOSIS — H2511 Age-related nuclear cataract, right eye: Secondary | ICD-10-CM | POA: Diagnosis not present

## 2015-06-06 DIAGNOSIS — H2512 Age-related nuclear cataract, left eye: Secondary | ICD-10-CM | POA: Diagnosis not present

## 2015-06-06 DIAGNOSIS — H25012 Cortical age-related cataract, left eye: Secondary | ICD-10-CM | POA: Diagnosis not present

## 2015-06-17 ENCOUNTER — Other Ambulatory Visit: Payer: Self-pay | Admitting: Family Medicine

## 2015-07-09 DIAGNOSIS — L570 Actinic keratosis: Secondary | ICD-10-CM | POA: Diagnosis not present

## 2015-07-09 DIAGNOSIS — D225 Melanocytic nevi of trunk: Secondary | ICD-10-CM | POA: Diagnosis not present

## 2015-07-09 DIAGNOSIS — L812 Freckles: Secondary | ICD-10-CM | POA: Diagnosis not present

## 2015-07-09 DIAGNOSIS — L218 Other seborrheic dermatitis: Secondary | ICD-10-CM | POA: Diagnosis not present

## 2015-07-09 DIAGNOSIS — D2272 Melanocytic nevi of left lower limb, including hip: Secondary | ICD-10-CM | POA: Diagnosis not present

## 2015-07-09 DIAGNOSIS — D2261 Melanocytic nevi of right upper limb, including shoulder: Secondary | ICD-10-CM | POA: Diagnosis not present

## 2015-07-09 DIAGNOSIS — D2271 Melanocytic nevi of right lower limb, including hip: Secondary | ICD-10-CM | POA: Diagnosis not present

## 2015-07-09 DIAGNOSIS — D2262 Melanocytic nevi of left upper limb, including shoulder: Secondary | ICD-10-CM | POA: Diagnosis not present

## 2015-07-09 DIAGNOSIS — L821 Other seborrheic keratosis: Secondary | ICD-10-CM | POA: Diagnosis not present

## 2015-08-01 ENCOUNTER — Ambulatory Visit (INDEPENDENT_AMBULATORY_CARE_PROVIDER_SITE_OTHER): Payer: Medicare Other | Admitting: Family Medicine

## 2015-08-01 ENCOUNTER — Encounter: Payer: Self-pay | Admitting: Family Medicine

## 2015-08-01 VITALS — BP 122/64 | HR 51 | Temp 97.9°F | Wt 185.0 lb

## 2015-08-01 DIAGNOSIS — E785 Hyperlipidemia, unspecified: Secondary | ICD-10-CM

## 2015-08-01 DIAGNOSIS — I480 Paroxysmal atrial fibrillation: Secondary | ICD-10-CM | POA: Diagnosis not present

## 2015-08-01 DIAGNOSIS — D649 Anemia, unspecified: Secondary | ICD-10-CM | POA: Insufficient documentation

## 2015-08-01 DIAGNOSIS — D539 Nutritional anemia, unspecified: Secondary | ICD-10-CM | POA: Diagnosis not present

## 2015-08-01 DIAGNOSIS — D7589 Other specified diseases of blood and blood-forming organs: Secondary | ICD-10-CM | POA: Diagnosis not present

## 2015-08-01 DIAGNOSIS — I10 Essential (primary) hypertension: Secondary | ICD-10-CM

## 2015-08-01 DIAGNOSIS — R6889 Other general symptoms and signs: Secondary | ICD-10-CM | POA: Diagnosis not present

## 2015-08-01 DIAGNOSIS — Z0001 Encounter for general adult medical examination with abnormal findings: Secondary | ICD-10-CM

## 2015-08-01 LAB — CBC
HCT: 44.7 % (ref 39.0–52.0)
Hemoglobin: 15.4 g/dL (ref 13.0–17.0)
MCHC: 34.6 g/dL (ref 30.0–36.0)
MCV: 99.9 fl (ref 78.0–100.0)
PLATELETS: 177 10*3/uL (ref 150.0–400.0)
RBC: 4.47 Mil/uL (ref 4.22–5.81)
RDW: 13.9 % (ref 11.5–15.5)
WBC: 5.4 10*3/uL (ref 4.0–10.5)

## 2015-08-01 LAB — BASIC METABOLIC PANEL
BUN: 25 mg/dL — AB (ref 6–23)
CHLORIDE: 100 meq/L (ref 96–112)
CO2: 25 mEq/L (ref 19–32)
Calcium: 9.2 mg/dL (ref 8.4–10.5)
Creatinine, Ser: 0.85 mg/dL (ref 0.40–1.50)
GFR: 93.35 mL/min (ref >60.00)
GLUCOSE: 117 mg/dL — AB (ref 70–99)
POTASSIUM: 4.5 meq/L (ref 3.5–5.1)
Sodium: 133 mEq/L — ABNORMAL LOW (ref 135–145)

## 2015-08-01 LAB — VITAMIN B12: Vitamin B-12: 308 pg/mL (ref 211–911)

## 2015-08-01 NOTE — Assessment & Plan Note (Signed)
Has had macroyctic anemia in past- will also check B12 and folate

## 2015-08-01 NOTE — Assessment & Plan Note (Signed)
S: great HDL, LDL between 100-130. Discussed elevated risk primarily driven by age.  A/P: given volume of medications patient has to take and fact he is almost 34 where benefit/risk is unclear per ASCVD risk estimator- patient declines statin again- we discussed onc e aweek option and he remained uninterested

## 2015-08-01 NOTE — Assessment & Plan Note (Addendum)
S: appears in sinus. Compliant with pradaxa, tikosyn, coreg. States he did go out of rhythm when he was taking probiotics and stopped when he stopped A/P: continue current medicine- sees [name] within next few months. Remain off probiotics

## 2015-08-01 NOTE — Progress Notes (Signed)
Travis Reddish, MD Phone: [phone]  Subjective:  Patient presents today for their annual wellness visit.    Preventive Screening-Counseling & Management  Smoking Status: former Smoker quit 1988 37.5 pack years  Second Hand Smoking status: No smokers in home  Risk Factors Regular exercise: trainer for an hour 3 days a week, walk for hour 3 days a week Diet: reasonable  Fall Risk: None   Cardiac risk factors:  advanced age (older than 52 for men, 71 for women)  Hyperlipidemia - yes, does not want primary prevention- declines No diabetes.  Family History: father with CVA at 53- active smoker and sedentary   Depression Screen None. PHQ2 0   Activities of Daily Living Independent ADLs and IADLs   Hearing Difficulties: -patient declines  Cognitive Testing No reported trouble.   Normal 3 word recall   List the Names of Other Physician/Practitioners you currently use: -[name] EP -Roderic Palau in a fib clinic -Dr. Gershon Crane optho -Dr. Mary Sella dermatology  Immunization History  Administered Date(s) Administered  . Influenza Split 03/04/2011, 02/04/2012  . Influenza Whole 04/17/2004, 03/31/2007, 02/17/2008, 04/12/2009, 03/13/2010  . Influenza,inj,Quad PF,36+ Mos 02/09/2013, 03/06/2014, 01/30/2015  . Pneumococcal Conjugate-13 12/19/2014  . Pneumococcal Polysaccharide-23 04/12/2009  . Td 04/17/2004  . Zoster 09/25/2010   Required Immunizations needed today  Health Maintenance Due  Topic Date Due  . TETANUS/TDAP - not covered 04/17/2014   Screening tests- up to date  ROS- No pertinent positives discovered in course of AWV Pertinent:  No chest pain or shortness of breath. No headache or blurry vision. No palpitations. Had some dizzy spells in the fall once every 2 weeks lasting 2-3 seconds.   The following were reviewed and entered/updated in epic: Past Medical History  Diagnosis Date  . Arthritis   . Hypertension   . GERD (gastroesophageal  reflux disease)   . Bicuspid aortic valve   . 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 Sinus bradycardia   . Paroxysmal atrial fibrillation (HCC)   . Cardiomyopathy -resolved     tachycardia-induced, EF 55% June 2009  . History of cardioversion 12/16/2012  . Left bundle branch block   . CHF (congestive heart failure) ([place])     JULY 2014  . History of stomach ulcers    Patient Active Problem List   Diagnosis Date Noted  . Pulmonary hypertension ([place]) 03/09/2012    Priority: High  . Bicuspid aortic valve     Priority: High  . Paroxysmal atrial fibrillation (HCC)     Priority: High  . Cardiomyopathy, rate related with resolution now recurrent 12/08/2011    Priority: High  . Hyperlipidemia 12/19/2014    Priority: Medium  . Anxiety state 03/06/2014    Priority: Medium  . Insomnia 03/06/2014    Priority: Medium  . Sinus bradycardia 01/04/2013    Priority: Medium  . Hyponatremia 02/21/2012    Priority: Medium  . Hypertension     Priority: Medium  . Left bundle branch block 12/10/2010    Priority: Medium  . Male pattern baldness 03/06/2014    Priority: Low  . Overweight (BMI 25.0-29.9) 02/08/2014    Priority: Low  . S/P TKR (total knee replacement) 02/08/2014    Priority: Low  . DRY MOUTH 11/07/2009    Priority: Low  . Carotid bruit 07/05/2009    Priority: Low  . GERD 01/07/2008    Priority: Low  . Osteoarthritis 02/04/2007    Priority: Low  . GANGLION CYST, WRIST, LEFT 02/04/2007  Priority: Low   Past Surgical History  Procedure Laterality Date  . Cerebral anuersym post clips    . Rotator cuff repair      lf  . Fractured left arm    . Left tendon repair      lft foot  . Hernia repair      lft  . Wrist ganglion excision      lft  . Tonsillectomy    . Inguinal hernia repair  07/02/2011    Procedure: LAPAROSCOPIC INGUINAL HERNIA;  Surgeon: Harl Bowie, MD;  Location: [place];  Service: General;  Laterality: Left;  Laparoscopic left inguinal hernia  repair and mesh  . Cardiac catheterization    . Cardioversion N/A 12/16/2012    Procedure: CARDIOVERSION;  Surgeon: Lelon Perla, MD;  Location: [facility] ENDOSCOPY;  Service: Cardiovascular;  Laterality: N/A;  . Total knee arthroplasty Bilateral 02/06/2014    Procedure: TOTAL KNEE BILATERAL;  Surgeon: Mauri Pole, MD;  [facility];  Service: Orthopedics;  Laterality: Bilateral;  . Cataract extraction  05/2015    bilateral    Family History  Problem Relation Age of Onset  . Stroke Father 73    smoker  . Lung cancer Mother 73    former smoker  . Stroke Mother   . Colon cancer Neg Hx   . Stroke Brother     Medications- reviewed and updated Current Outpatient Prescriptions  Medication Sig Dispense Refill  . amLODipine (NORVASC) 10 MG tablet TAKE 1 TABLET(10 MG) BY MOUTH DAILY 90 tablet 2  . b complex vitamins tablet Take 1 tablet by mouth daily.     . carvedilol (COREG) 3.125 MG tablet TAKE 1 TABLET BY MOUTH TWICE DAILY 60 tablet 9  . dofetilide (TIKOSYN) 500 MCG capsule TAKE ONE CAPSULE BY MOUTH EVERY 12 HOURS 180 capsule 1  . finasteride (PROPECIA) 1 MG tablet TAKE 1 TABLET(1 MG) BY MOUTH DAILY 30 tablet 5  . fish oil-omega-3 fatty acids 1000 MG capsule Take 1,000 mg by mouth daily.     . Flaxseed, Linseed, (RA FLAX SEED OIL 1000 PO) Take 1,000 mg by mouth 3 (three) times daily.     . meloxicam (MOBIC) 15 MG tablet TAKE 1 TABLET BY MOUTH DAILY 90 tablet 3  . omeprazole (PRILOSEC) 20 MG capsule TAKE ONE CAPSULE BY MOUTH EVERY DAY 90 capsule 0  . PRADAXA 150 MG CAPS capsule TAKE ONE CAPSULE BY MOUTH TWICE DAILY 60 capsule 10  . telmisartan (MICARDIS) 80 MG tablet TAKE 1 TABLET BY MOUTH EVERY NIGHT AT BEDTIME 30 tablet 5  . acetaminophen (TYLENOL) 500 MG tablet Take 1,000 mg by mouth every 6 (six) hours as needed for mild pain or moderate pain. Reported on 08/01/2015    . temazepam (RESTORIL) 15 MG capsule TAKE 1 CAPSULE BY MOUTH EVERY DAY AT BEDTIME AS NEEDED (Patient not taking:  Reported on 08/01/2015) 30 capsule 5   Allergies-reviewed and updated No Known Allergies  Social History   Social History  . Marital Status: Single    Spouse Name: N/A  . Number of Children: 0  . Years of Education: N/A   Occupational History  . Retired    Social History Main Topics  . Smoking status: Former Smoker -- 1.50 packs/day for 25 years    Quit date: 05/26/1986  . Smokeless tobacco: Never Used  . Alcohol Use: 0.5 oz/week    1 drink(s) per week     Comment: 1 martini daily   . Drug  Use: No  . Sexual Activity: Not Currently   Other Topics Concern  . None   Social History Narrative   Homosexual. Lives alone. Not sexually active.       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 Oropharynx normal. TM normal CV: regular rate but bradycardic, systolic murmur Lungs: CTAB no crackles, wheeze, rhonchi Abdomen: soft/nontender/nondistended/normal bowel sounds. Normal weight.  Ext: no edema Skin: warm, dry Neuro: grossly normal, moves all extremities, PERRLA  Assessment/Plan:  AWV completed  Problem oriented visit also completed:  Paroxysmal atrial fibrillation S: appears in sinus. Compliant with pradaxa, tikosyn, coreg. States he did go out of rhythm when he was taking probiotics and stopped when he stopped A/P: continue current medicine- sees [name] within next few months. Remain off probiotics   Hypertension S: controlled on Amlodipine 10mg , carvedilol 3.125 mg BID, telmisartan 80mg . Home readings up to high 130s but not above BP Readings from Last 3 Encounters:  08/01/15 122/64  05/24/15 148/84  02/19/15 138/78  A/P:Continue current meds:  Does have slight intermittent edema on higher amlodipine but need  for BP control.    Hyperlipidemia S: great HDL, LDL between 100-130. Discussed elevated risk primarily driven by age.  A/P: given volume of medications patient has to take and fact he is almost 8 where benefit/risk is unclear per ASCVD risk estimator- patient declines statin again- we discussed onc e aweek option and he remained uninterested   Macrocytic anemia Has had macroyctic anemia in past- will also check B12 and folate   Return in about 6 months (around 02/01/2016) for follow up- or sooner if needed. Return precautions advised.   Not fasting Orders Placed This Encounter  Procedures  . CBC    [place]  . Basic metabolic panel    [place]  . Vitamin B12  . Folate RBC

## 2015-08-01 NOTE — Patient Instructions (Addendum)
  [name] , Thank you for taking time to come for your Medicare Wellness Visit. I appreciate your ongoing commitment to your health goals. Please review the following plan we discussed and let me know if I can assist you in the future.  ______________________________________________________ These are the goals we discussed: 1. Keep up the great job with exercise 2. Labs before you go  _____________________________________________________  This is a list of the screening recommended for you and due dates:  Health Maintenance  Topic Date Due  . Tetanus Vaccine  04/17/2014  . Flu Shot  12/25/2015  . Colon Cancer Screening  02/24/2016  . Shingles Vaccine  Completed  . Pneumonia vaccines  Completed

## 2015-08-01 NOTE — Assessment & Plan Note (Signed)
S: controlled on Amlodipine 10mg , carvedilol 3.125 mg BID, telmisartan 80mg . Home readings up to high 130s but not above BP Readings from Last 3 Encounters:  08/01/15 122/64  05/24/15 148/84  02/19/15 138/78  A/P:Continue current meds:  Does have slight intermittent edema on higher amlodipine but need for BP control.

## 2015-08-02 ENCOUNTER — Other Ambulatory Visit: Payer: Self-pay | Admitting: *Deleted

## 2015-08-02 LAB — FOLATE RBC: RBC Folate: 637 ng/mL (ref >280)

## 2015-08-02 MED ORDER — DABIGATRAN ETEXILATE MESYLATE 150 MG PO CAPS: 150.0000 mg | ORAL_CAPSULE | Freq: Two times a day (BID) | ORAL | Status: DC

## 2015-08-11 ENCOUNTER — Other Ambulatory Visit: Payer: Self-pay | Admitting: Gastroenterology

## 2015-08-16 ENCOUNTER — Other Ambulatory Visit: Payer: Self-pay | Admitting: *Deleted

## 2015-08-16 MED ORDER — DOFETILIDE 500 MCG PO CAPS: ORAL_CAPSULE | ORAL | Status: DC

## 2015-09-04 ENCOUNTER — Ambulatory Visit (INDEPENDENT_AMBULATORY_CARE_PROVIDER_SITE_OTHER): Payer: Medicare Other | Admitting: Internal Medicine

## 2015-09-04 ENCOUNTER — Encounter: Payer: Self-pay | Admitting: Internal Medicine

## 2015-09-04 VITALS — BP 118/70 | HR 48 | Ht 68.0 in | Wt 184.2 lb

## 2015-09-04 DIAGNOSIS — I6529 Occlusion and stenosis of unspecified carotid artery: Secondary | ICD-10-CM

## 2015-09-04 DIAGNOSIS — I480 Paroxysmal atrial fibrillation: Secondary | ICD-10-CM

## 2015-09-04 MED ORDER — CARVEDILOL 3.125 MG PO TABS: 3.1250 mg | ORAL_TABLET | Freq: Two times a day (BID) | ORAL | Status: DC

## 2015-09-04 NOTE — Patient Instructions (Signed)
Medication Instructions: - Your physician recommends that you continue on your current medications as directed. Please refer to the Current Medication list given to you today.  Labwork: - none  Procedures/Testing: - Your physician has requested that you have a carotid duplex. This test is an ultrasound of the carotid arteries in your neck. It looks at blood flow through these arteries that supply the brain with blood. Allow one hour for this exam. There are no restrictions or special instructions.  - Your physician has requested that you have an echocardiogram- 1 year (just prior to follow up with [name]). Echocardiography is a painless test that uses sound waves to create images of your heart. It provides your doctor with information about the size and shape of your heart and how well your heart's chambers and valves are working. This procedure takes approximately one hour. There are no restrictions for this procedure.  Follow-Up: - Your physician wants you to follow-up in: 6 months with the [facility] Roderic Palau, NP) & 1 year with [name]. You will receive a reminder letter in the mail two months in advance. If you don't receive a letter, please call our office to schedule the follow-up appointment.  Any Additional Special Instructions Will Be Listed Below (If Applicable).     If you need a refill on your cardiac medications before your next appointment, please call your pharmacy.

## 2015-09-04 NOTE — Progress Notes (Signed)
Patient Care Team: [name], MD as PCP - General (Family Medicine)   HPI  Travis [name] is a 75 y.o. male Seen in followup for paroxysmal atrial fibrillation and tachycardia-induced and resolved cardiomyopathy seen in followup today.   He ended up with congestive heart failure 7/14 with an ejection fraction of 35%. Propafenone was discontinued and dofetilide was initiated. Echocardiogram 10/14 demonstrated normalization of LV function.  The patient denies chest pain, shortness of breath, nocturnal dyspnea, orthopnea or peripheral edema.  There have been no palpitations, lightheadedness or syncope.   He also has a probable bicuspid aortic valve with mild stenosis; repeat echocardiogram 3/16 demonstrated stable parameters. LV systolic function was normal  He underwent knee replacement without difficulty   Past Medical History  Diagnosis Date  . Arthritis   . Hypertension   . GERD (gastroesophageal reflux disease)   . Bicuspid aortic valve   . 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 Sinus bradycardia   . Paroxysmal atrial fibrillation (HCC)   . Cardiomyopathy -resolved     tachycardia-induced, EF 55% June 2009  . History of cardioversion 12/16/2012  . Left bundle branch block   . CHF (congestive heart failure) ([place])     JULY 2014  . History of stomach ulcers     Past Surgical History  Procedure Laterality Date  . Cerebral anuersym post clips    . Rotator cuff repair      lf  . Fractured left arm    . Left tendon repair      lft foot  . Hernia repair      lft  . Wrist ganglion excision      lft  . Tonsillectomy    . Inguinal hernia repair  07/02/2011    Procedure: LAPAROSCOPIC INGUINAL HERNIA;  Surgeon: Harl Bowie, MD;  Location: [facility];  Service: General;  Laterality: Left;  Laparoscopic left inguinal hernia repair and mesh  . Cardiac catheterization    . Cardioversion N/A 12/16/2012    Procedure: CARDIOVERSION;  Surgeon: Lelon Perla, MD;   Location: [facility] ENDOSCOPY;  Service: Cardiovascular;  Laterality: N/A;  . Total knee arthroplasty Bilateral 02/06/2014    Procedure: TOTAL KNEE BILATERAL;  Surgeon: Mauri Pole, MD;  Location: [facility] ORS;  Service: Orthopedics;  Laterality: Bilateral;  . Cataract extraction  05/2015    bilateral    Current Outpatient Prescriptions  Medication Sig Dispense Refill  . acetaminophen (TYLENOL) 500 MG tablet Take 1,000 mg by mouth every 6 (six) hours as needed for mild pain or moderate pain. Reported on 08/01/2015    . amLODipine (NORVASC) 10 MG tablet TAKE 1 TABLET(10 MG) BY MOUTH DAILY 90 tablet 2  . b complex vitamins tablet Take 1 tablet by mouth daily.     . carvedilol (COREG) 3.125 MG tablet TAKE 1 TABLET BY MOUTH TWICE DAILY 60 tablet 9  . dabigatran (PRADAXA) 150 MG CAPS capsule Take 1 capsule (150 mg total) by mouth 2 (two) times daily. 60 capsule 1  . dofetilide (TIKOSYN) 500 MCG capsule TAKE ONE CAPSULE BY MOUTH EVERY 12 HOURS 180 capsule 3  . finasteride (PROPECIA) 1 MG tablet TAKE 1 TABLET(1 MG) BY MOUTH DAILY 30 tablet 5  . fish oil-omega-3 fatty acids 1000 MG capsule Take 1,000 mg by mouth daily.     . Flaxseed, Linseed, (RA FLAX SEED OIL 1000 PO) Take 1,000 mg by mouth 3 (three) times daily.     . meloxicam (MOBIC) 15 MG  tablet TAKE 1 TABLET BY MOUTH DAILY 90 tablet 3  . omeprazole (PRILOSEC) 20 MG capsule TAKE ONE CAPSULE BY MOUTH EVERY DAY 90 capsule 0  . telmisartan (MICARDIS) 80 MG tablet TAKE 1 TABLET BY MOUTH EVERY NIGHT AT BEDTIME 30 tablet 5  . temazepam (RESTORIL) 15 MG capsule TAKE 1 CAPSULE BY MOUTH EVERY DAY AT BEDTIME AS NEEDED 30 capsule 5   No current facility-administered medications for this visit.    No Known Allergies  Review of Systems negative except from HPI and PMH  Physical Exam BP 118/70 mmHg  Pulse 48  Ht 5\' 8"  (1.727 m)  Wt 184 lb 3.2 oz (83.553 kg)  BMI 28.01 kg/m2 Well developed and well nourished in no acute distress HENT normal E scleral and  icterus clear Neck Supple JVP flat; carotids brisk and full Clear to ausculation  slow Regular rate and rhythm, 2/6 murmur Soft with active bowel sounds No clubbing cyanosis none Edema Alert and oriented, grossly normal motor and sensory function Skin Warm and Dry  ECG ordered today and  demonstrates a  heart rate of 47 Intervals 16/15/48 Axis left -45  Assessment and  Plan  Atrial fibrillation-paroxysmal holding sinus rhythm on dofetilide.  He remains on dabigitran  Cardiomyopathy-tachycardia mediated-resolved Continue beta blockers and ARB  Bicuspid aortic valve- recehck echo next year   Hypertension   His aortic stenosis/bicuspid valve remained stable.   Carotid Dopplers   scheduled to be repeated 4/17.  Atrial fibrillation one intervening event   Continue current meds with dofetilide; no bleeding issues continue  I will have him follow-up with the A. fib clinic in 6 months and I will see him in 12 months.

## 2015-09-05 NOTE — Addendum Note (Signed)
Addended by: Freada Bergeron on: 09/05/2015 05:32 PM   Modules accepted: Orders

## 2015-09-06 ENCOUNTER — Ambulatory Visit (HOSPITAL_COMMUNITY)
Admission: RE | Admit: 2015-09-06 | Discharge: 2015-09-06 | Disposition: A | Discharge status: Home / Self Care | Payer: Medicare Other | Source: Ambulatory Visit | Attending: Internal Medicine | Admitting: Internal Medicine

## 2015-09-06 DIAGNOSIS — I447 Left bundle-branch block, unspecified: Secondary | ICD-10-CM | POA: Diagnosis not present

## 2015-09-06 DIAGNOSIS — I6502 Occlusion and stenosis of left vertebral artery: Secondary | ICD-10-CM | POA: Diagnosis not present

## 2015-09-06 DIAGNOSIS — I6529 Occlusion and stenosis of unspecified carotid artery: Secondary | ICD-10-CM

## 2015-09-06 DIAGNOSIS — I110 Hypertensive heart disease with heart failure: Secondary | ICD-10-CM | POA: Diagnosis not present

## 2015-09-06 DIAGNOSIS — I6523 Occlusion and stenosis of bilateral carotid arteries: Secondary | ICD-10-CM | POA: Diagnosis not present

## 2015-09-06 DIAGNOSIS — K219 Gastro-esophageal reflux disease without esophagitis: Secondary | ICD-10-CM | POA: Insufficient documentation

## 2015-09-06 DIAGNOSIS — I509 Heart failure, unspecified: Secondary | ICD-10-CM | POA: Insufficient documentation

## 2015-09-17 ENCOUNTER — Other Ambulatory Visit: Payer: Self-pay | Admitting: *Deleted

## 2015-09-17 DIAGNOSIS — I6529 Occlusion and stenosis of unspecified carotid artery: Secondary | ICD-10-CM

## 2015-09-19 ENCOUNTER — Other Ambulatory Visit: Payer: Self-pay | Admitting: Internal Medicine

## 2015-09-23 ENCOUNTER — Other Ambulatory Visit: Payer: Self-pay | Admitting: Family Medicine

## 2015-10-11 DIAGNOSIS — Z961 Presence of intraocular lens: Secondary | ICD-10-CM | POA: Diagnosis not present

## 2015-10-16 ENCOUNTER — Other Ambulatory Visit: Payer: Self-pay | Admitting: Family Medicine

## 2015-10-22 ENCOUNTER — Other Ambulatory Visit: Payer: Self-pay | Admitting: Family Medicine

## 2015-10-31 NOTE — Telephone Encounter (Signed)
Last OV 08/01/15... Last filled 04/25/15, #30 with 5 refills... Okay to refill?

## 2015-11-01 NOTE — Telephone Encounter (Signed)
Yes thanks, may fill 

## 2015-11-04 ENCOUNTER — Other Ambulatory Visit: Payer: Self-pay | Admitting: Gastroenterology

## 2015-12-26 DIAGNOSIS — L570 Actinic keratosis: Secondary | ICD-10-CM | POA: Diagnosis not present

## 2016-01-16 ENCOUNTER — Encounter: Payer: Self-pay | Admitting: Gastroenterology

## 2016-01-19 ENCOUNTER — Other Ambulatory Visit: Payer: Self-pay | Admitting: Family Medicine

## 2016-01-28 DIAGNOSIS — R05 Cough: Secondary | ICD-10-CM | POA: Diagnosis not present

## 2016-01-28 DIAGNOSIS — J22 Unspecified acute lower respiratory infection: Secondary | ICD-10-CM | POA: Diagnosis not present

## 2016-01-29 ENCOUNTER — Other Ambulatory Visit: Payer: Self-pay

## 2016-02-01 ENCOUNTER — Ambulatory Visit (INDEPENDENT_AMBULATORY_CARE_PROVIDER_SITE_OTHER): Payer: Medicare Other | Admitting: Family Medicine

## 2016-02-01 ENCOUNTER — Encounter: Payer: Self-pay | Admitting: Family Medicine

## 2016-02-01 VITALS — BP 128/78 | HR 93 | Temp 97.9°F | Wt 183.6 lb

## 2016-02-01 DIAGNOSIS — I10 Essential (primary) hypertension: Secondary | ICD-10-CM | POA: Diagnosis not present

## 2016-02-01 DIAGNOSIS — F411 Generalized anxiety disorder: Secondary | ICD-10-CM

## 2016-02-01 DIAGNOSIS — I480 Paroxysmal atrial fibrillation: Secondary | ICD-10-CM | POA: Diagnosis not present

## 2016-02-01 DIAGNOSIS — Z23 Encounter for immunization: Secondary | ICD-10-CM | POA: Diagnosis not present

## 2016-02-01 DIAGNOSIS — I6529 Occlusion and stenosis of unspecified carotid artery: Secondary | ICD-10-CM | POA: Diagnosis not present

## 2016-02-01 DIAGNOSIS — E785 Hyperlipidemia, unspecified: Secondary | ICD-10-CM

## 2016-02-01 DIAGNOSIS — I499 Cardiac arrhythmia, unspecified: Secondary | ICD-10-CM | POA: Diagnosis not present

## 2016-02-01 MED ORDER — ALPRAZOLAM 0.5 MG PO TABS: 0.5000 mg | ORAL_TABLET | Freq: Two times a day (BID) | ORAL | 0 refills | Status: DC | PRN

## 2016-02-01 MED ORDER — ATORVASTATIN CALCIUM 40 MG PO TABS: 40.0000 mg | ORAL_TABLET | ORAL | 3 refills | Status: DC

## 2016-02-01 NOTE — Assessment & Plan Note (Signed)
S: doing well on amlodipine 10mg , carvedilol 3.125 mg BID, telmisartan 80mg . Home readings controlled- 117 most recent check at home. occasional edema.  A/P: continue current meds

## 2016-02-01 NOTE — Assessment & Plan Note (Signed)
History of panic attacks- has not had one in years . #5 xanax. Having on hand gives him peace.

## 2016-02-01 NOTE — Progress Notes (Signed)
Pre visit review using our clinic review tool, if applicable. No additional management support is needed unless otherwise documented below in the visit note. 

## 2016-02-01 NOTE — Assessment & Plan Note (Signed)
S: poorly controlled on no statin. No myalgias.  Lab Results  Component Value Date   CHOL 216 (H) 12/20/2014   HDL 69.40 12/20/2014   LDLCALC 128 (H) 12/20/2014   LDLDIRECT 128.1 02/09/2013   TRIG 93.0 12/20/2014   CHOLHDL 3 12/20/2014   A/P: ascvd risk estimator predicts 28% down to 21% risk 10 year with statin start. Does agree to trial of once weekly atorvastatin 40mg .

## 2016-02-01 NOTE — Progress Notes (Signed)
Subjective:  Travis [name] is a 75 y.o. year old very pleasant male patient who presents for/with See problem oriented charting ROS- No chest pain or shortness of breath. No headache or blurry vision.  Denies palpitations as well. .see any ROS included in HPI as well.   Past Medical History-  Patient Active Problem List   Diagnosis Date Noted  . Pulmonary hypertension ([place]) 03/09/2012    Priority: High  . Bicuspid aortic valve     Priority: High  . Paroxysmal atrial fibrillation (HCC)     Priority: High  . Cardiomyopathy, rate related with resolution now recurrent 12/08/2011    Priority: High  . Hyperlipidemia 12/19/2014    Priority: Medium  . Anxiety state 03/06/2014    Priority: Medium  . Insomnia 03/06/2014    Priority: Medium  . Sinus bradycardia 01/04/2013    Priority: Medium  . Hyponatremia 02/21/2012    Priority: Medium  . Hypertension     Priority: Medium  . Left bundle branch block 12/10/2010    Priority: Medium  . Macrocytic anemia 08/01/2015    Priority: Low  . Male pattern baldness 03/06/2014    Priority: Low  . Overweight (BMI 25.0-29.9) 02/08/2014    Priority: Low  . S/P TKR (total knee replacement) 02/08/2014    Priority: Low  . DRY MOUTH 11/07/2009    Priority: Low  . Carotid bruit 07/05/2009    Priority: Low  . GERD 01/07/2008    Priority: Low  . Osteoarthritis 02/04/2007    Priority: Low  . GANGLION CYST, WRIST, LEFT 02/04/2007    Priority: Low    Medications- reviewed and updated Current Outpatient Prescriptions  Medication Sig Dispense Refill  . acetaminophen (TYLENOL) 500 MG tablet Take 1,000 mg by mouth every 6 (six) hours as needed for mild pain or moderate pain. Reported on 08/01/2015    . amLODipine (NORVASC) 10 MG tablet TAKE 1 TABLET(10 MG) BY MOUTH DAILY 90 tablet 2  . b complex vitamins tablet Take 1 tablet by mouth daily.     . carvedilol (COREG) 3.125 MG tablet Take 1 tablet (3.125 mg total) by mouth 2 (two) times daily. 180 tablet 3   . dofetilide (TIKOSYN) 500 MCG capsule TAKE ONE CAPSULE BY MOUTH EVERY 12 HOURS 180 capsule 3  . finasteride (PROPECIA) 1 MG tablet TAKE 1 TABLET(1 MG) BY MOUTH DAILY 30 tablet 5  . fish oil-omega-3 fatty acids 1000 MG capsule Take 1,000 mg by mouth daily.     . Flaxseed, Linseed, (RA FLAX SEED OIL 1000 PO) Take 1,000 mg by mouth 3 (three) times daily.     . meloxicam (MOBIC) 15 MG tablet TAKE 1 TABLET BY MOUTH DAILY 90 tablet 1  . omeprazole (PRILOSEC) 20 MG capsule TAKE ONE CAPSULE BY MOUTH EVERY DAY 90 capsule 0  . PRADAXA 150 MG CAPS capsule TAKE 1 CAPSULE(150 MG) BY MOUTH TWICE DAILY 60 capsule 11  . telmisartan (MICARDIS) 80 MG tablet TAKE 1 TABLET BY MOUTH EVERY NIGHT AT BEDTIME 30 tablet 5  . temazepam (RESTORIL) 15 MG capsule TAKE ONE CAPSULE BY MOUTH AT BEDTIME AS NEEDED 30 capsule 5  . ALPRAZolam (XANAX) 0.5 MG tablet Take 1 tablet (0.5 mg total) by mouth 2 (two) times daily as needed for anxiety (separate by 8 hours from temazepam). 5 tablet 0  . atorvastatin (LIPITOR) 40 MG tablet Take 1 tablet (40 mg total) by mouth once a week. 14 tablet 3   No current facility-administered medications for this visit.  Objective: BP 128/78 (BP Location: Left Arm, Patient Position: Sitting, Cuff Size: Normal)   Pulse 93   Temp 97.9 F (36.6 C) (Oral)   Wt 183 lb 9.6 oz (83.3 kg)   SpO2 95%   BMI 27.92 kg/m  Gen: NAD, resting comfortably CV: irregularly irregular, no rubs or gallops Lungs: CTAB no crackles, wheeze, rhonchi Abdomen: soft/nontender/nondistended/normal bowel sounds. No rebound or guarding.  Ext: no edema Skin: warm, dry Neuro: grossly normal, moves all extremities  EKG: atrial fibrillation with rate of 100. Left axis. qrs prolonged. Left bundle branch block.   Assessment/Plan:  Started treatment on doxycycline on Monday for bronchitis treated at urgent care- [facility]. Has improved stil lsome lingering cough   Hypertension S: doing well on amlodipine  10mg , carvedilol 3.125 mg BID, telmisartan 80mg . Home readings controlled- 117 most recent check at home. occasional edema.  A/P: continue current meds  Paroxysmal atrial fibrillation ([place]) S: . Remains on pradaxa, tikosyn, coreg. Patient states usually can tell if in a fib and has not thought he has been in a fib. Normal HR in 50s or 60s and was in 90s on exam so EKG Done.  A/P: continue current meds. His home BP cuff reads his HR and tells him if in a fib usually- if HR not back in 50s or 60s or states a fib or he has symptoms- he will seek care or call a fib clinic on Monday. Bleeding risk high on mobic but simply does not tolerate daily activity without it due to arthritis in hands- opted to remain on for now   Hyperlipidemia S: poorly controlled on no statin. No myalgias.  Lab Results  Component Value Date   CHOL 216 (H) 12/20/2014   HDL 69.40 12/20/2014   LDLCALC 128 (H) 12/20/2014   LDLDIRECT 128.1 02/09/2013   TRIG 93.0 12/20/2014   CHOLHDL 3 12/20/2014   A/P: ascvd risk estimator predicts 28% down to 21% risk 10 year with statin start. Does agree to trial of once weekly atorvastatin 40mg .   Anxiety state History of panic attacks- has not had one in years . #5 xanax. Having on hand gives him peace.    Return in about 6 months (around 07/31/2016).  Orders Placed This Encounter  Procedures  . Flu vaccine HIGH DOSE PF  . EKG 12-Lead    Order Specific Question:   Where should this test be performed    Answer:   Other  patient was NOT given flu shot- will have chart corrected  Meds ordered this encounter  Medications  . atorvastatin (LIPITOR) 40 MG tablet    Sig: Take 1 tablet (40 mg total) by mouth once a week.    Dispense:  14 tablet    Refill:  3  . ALPRAZolam (XANAX) 0.5 MG tablet    Sig: Take 1 tablet (0.5 mg total) by mouth 2 (two) times daily as needed for anxiety (separate by 8 hours from temazepam).    Dispense:  5 tablet    Refill:  0    Return precautions  advised.  [name], MD

## 2016-02-01 NOTE — Assessment & Plan Note (Addendum)
S: . Remains on pradaxa, tikosyn, coreg. Patient states usually can tell if in a fib and has not thought he has been in a fib. Normal HR in 50s or 60s and was in 90s on exam so EKG Done.  A/P: continue current meds. His home BP cuff reads his HR and tells him if in a fib usually- if HR not back in 50s or 60s or states a fib or he has symptoms- he will seek care or call a fib clinic on Monday. Bleeding risk high on mobic but simply does not tolerate daily activity without it due to arthritis in hands- opted to remain on for now

## 2016-02-01 NOTE — Patient Instructions (Signed)
Advise flu shot  Start once a week statin  Xanax on hand but hopefully will not need  Hopeful you continue to improve from bronchitis  Bronchitis may have triggered you into a fib or you may have just randomly gone into it. You may just as randomly come out of it. I would call a fib clinic on Monday if continues through weekend.

## 2016-02-02 ENCOUNTER — Other Ambulatory Visit: Payer: Self-pay | Admitting: Gastroenterology

## 2016-03-10 ENCOUNTER — Other Ambulatory Visit: Payer: Self-pay | Admitting: Family Medicine

## 2016-03-13 ENCOUNTER — Other Ambulatory Visit: Payer: Self-pay | Admitting: Family Medicine

## 2016-03-13 ENCOUNTER — Encounter (HOSPITAL_COMMUNITY): Payer: Self-pay | Admitting: Nurse Practitioner

## 2016-03-13 ENCOUNTER — Ambulatory Visit (HOSPITAL_COMMUNITY)
Admission: RE | Admit: 2016-03-13 | Discharge: 2016-03-13 | Disposition: A | Discharge status: Home / Self Care | Payer: Medicare Other | Source: Ambulatory Visit | Attending: Nurse Practitioner | Admitting: Nurse Practitioner

## 2016-03-13 VITALS — BP 152/94 | HR 63 | Ht 68.0 in | Wt 185.4 lb

## 2016-03-13 DIAGNOSIS — Z87891 Personal history of nicotine dependence: Secondary | ICD-10-CM | POA: Insufficient documentation

## 2016-03-13 DIAGNOSIS — I480 Paroxysmal atrial fibrillation: Secondary | ICD-10-CM | POA: Diagnosis not present

## 2016-03-13 DIAGNOSIS — Z79899 Other long term (current) drug therapy: Secondary | ICD-10-CM | POA: Insufficient documentation

## 2016-03-13 DIAGNOSIS — I4891 Unspecified atrial fibrillation: Secondary | ICD-10-CM | POA: Diagnosis present

## 2016-03-13 DIAGNOSIS — Z7901 Long term (current) use of anticoagulants: Secondary | ICD-10-CM | POA: Diagnosis not present

## 2016-03-13 LAB — BASIC METABOLIC PANEL
Anion gap: 6 (ref 5–15)
BUN: 14 mg/dL (ref 6–20)
CHLORIDE: 99 mmol/L — AB (ref 101–111)
CO2: 27 mmol/L (ref 22–32)
Calcium: 9 mg/dL (ref 8.9–10.3)
Creatinine, Ser: 0.81 mg/dL (ref 0.61–1.24)
GFR calc Af Amer: >60 mL/min (ref >60)
GFR calc non Af Amer: >60 mL/min (ref >60)
GLUCOSE: 111 mg/dL — AB (ref 65–99)
POTASSIUM: 4.8 mmol/L (ref 3.5–5.1)
Sodium: 132 mmol/L — ABNORMAL LOW (ref 135–145)

## 2016-03-13 LAB — MAGNESIUM: Magnesium: 2 mg/dL (ref 1.7–2.4)

## 2016-03-13 NOTE — Progress Notes (Signed)
Patient ID: Travis [name], male   DOB: 1941-04-27, 75 y.o.   MRN: [id number]     Primary Care Physician: Travis Reddish, MD Referring Physician: Dr. Nance [name] is a 75 y.o. male with a h/o paroxymal  afib on tikosyn. He reports doing well over the last few months. Did take antibiotics several times early fall and had breakthrough afib but after finishing antibiotic, returned to [name]. He is in  [place] today. He thinks that Lipitor may trigger afib as well.  Currently off lipitor for this reason. He is compliant with tikosyn and with pradaxa. Continues to regularly exercise.  Today, he denies symptoms of palpitations, chest pain, shortness of breath, orthopnea, PND, lower extremity edema, dizziness, presyncope, syncope, or neurologic sequela. The patient is tolerating medications without difficulties and is otherwise without complaint today.   Past Medical History:  Diagnosis Date  . Arthritis   . Bicuspid aortic valve   . Cardiomyopathy -resolved    tachycardia-induced, EF 55% June 2009  . CHF (congestive heart failure) ([place])    JULY 2014  . GERD (gastroesophageal reflux disease)   . History of cardioversion 12/16/2012  . History of stomach ulcers   . Hypertension   . Left bundle branch block   . Paroxysmal atrial fibrillation (HCC)   . Sinus bradycardia   . Status post clamping of cerebral aneurysm 80   Past Surgical History:  Procedure Laterality Date  . CARDIAC CATHETERIZATION    . CARDIOVERSION N/A 12/16/2012   Procedure: CARDIOVERSION;  Surgeon: Travis Perla, MD;  Location: [facility] ENDOSCOPY;  Service: Cardiovascular;  Laterality: N/A;  . CATARACT EXTRACTION  05/2015   bilateral  . cerebral anuersym post clips    . fractured left arm    . HERNIA REPAIR     lft  . INGUINAL HERNIA REPAIR  07/02/2011   Procedure: LAPAROSCOPIC INGUINAL HERNIA;  Surgeon: Travis Bowie, MD;  Location: [facility];  Service: General;  Laterality: Left;  Laparoscopic left inguinal hernia repair and  mesh  . left tendon repair     lft foot  . ROTATOR CUFF REPAIR     lf  . TONSILLECTOMY    . TOTAL KNEE ARTHROPLASTY Bilateral 02/06/2014   Procedure: TOTAL KNEE BILATERAL;  Surgeon: Travis Pole, MD;  Location: [facility] ORS;  Service: Orthopedics;  Laterality: Bilateral;  . WRIST GANGLION EXCISION     lft    Current Outpatient Prescriptions  Medication Sig Dispense Refill  . acetaminophen (TYLENOL) 500 MG tablet Take 1,000 mg by mouth every 6 (six) hours as needed for mild pain or moderate pain. Reported on 08/01/2015    . ALPRAZolam (XANAX) 0.5 MG tablet Take 1 tablet (0.5 mg total) by mouth 2 (two) times daily as needed for anxiety (separate by 8 hours from temazepam). 5 tablet 0  . amLODipine (NORVASC) 10 MG tablet TAKE 1 TABLET(10 MG) BY MOUTH DAILY 90 tablet 2  . b complex vitamins tablet Take 1 tablet by mouth daily.     . carvedilol (COREG) 3.125 MG tablet Take 1 tablet (3.125 mg total) by mouth 2 (two) times daily. 180 tablet 3  . dofetilide (TIKOSYN) 500 MCG capsule TAKE ONE CAPSULE BY MOUTH EVERY 12 HOURS 180 capsule 3  . finasteride (PROPECIA) 1 MG tablet TAKE 1 TABLET(1 MG) BY MOUTH DAILY 30 tablet 5  . fish oil-omega-3 fatty acids 1000 MG capsule Take 1,000 mg by mouth daily.     . Flaxseed, Linseed, (RA FLAX  SEED OIL 1000 PO) Take 1,000 mg by mouth 3 (three) times daily.     . meloxicam (MOBIC) 15 MG tablet TAKE 1 TABLET BY MOUTH DAILY 90 tablet 1  . omeprazole (PRILOSEC) 20 MG capsule TAKE ONE CAPSULE BY MOUTH EVERY DAY 90 capsule 0  . PRADAXA 150 MG CAPS capsule TAKE 1 CAPSULE(150 MG) BY MOUTH TWICE DAILY 60 capsule 11  . telmisartan (MICARDIS) 80 MG tablet TAKE 1 TABLET BY MOUTH EVERY NIGHT AT BEDTIME 30 tablet 5  . temazepam (RESTORIL) 15 MG capsule TAKE ONE CAPSULE BY MOUTH AT BEDTIME AS NEEDED 30 capsule 5   No current facility-administered medications for this encounter.     No Known Allergies  Social History   Social History  . Marital status: Single    Spouse  name: N/A  . Number of children: 0  . Years of education: N/A   Occupational History  . Retired    Social History Main Topics  . Smoking status: Former Smoker    Packs/day: 1.50    Years: 25.00    Quit date: 05/26/1986  . Smokeless tobacco: Never Used  . Alcohol use 0.5 oz/week    1 drink(s) per week     Comment: 1 martini daily   . Drug use: No  . Sexual activity: Not Currently   Other Topics Concern  . Not on file   Social History Narrative   Homosexual. Lives alone. Not sexually active.       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 Stroke Father 18    smoker  . Lung cancer Mother 79    former smoker  . Stroke Mother   . Colon cancer Neg Hx   . Stroke Brother     ROS- All systems are reviewed and negative except as per the HPI above  Physical Exam: Vitals:   03/13/16 0924  BP: (!) 152/94  Pulse: 63  Weight: 185 lb 6.4 oz (84.1 kg)  Height: 5\' 8"  (1.727 m)    GEN- The patient is well appearing, alert and oriented x 3 today.   Head- normocephalic, atraumatic Eyes-  Sclera clear, conjunctiva pink Ears- hearing intact Oropharynx- clear Neck- supple, no JVP Lymph- no cervical lymphadenopathy Lungs- Clear to ausculation bilaterally, normal work of breathing Heart- Regular rate and rhythm, no murmurs, rubs or gallops, PMI not laterally displaced GI- soft, NT, ND, + BS Extremities- no clubbing, cyanosis, or edema MS- no significant deformity or atrophy Skin- no rash or lesion Psych- euthymic mood, full affect Neuro- strength and sensation are intact  EKG- Sinus rhythm at 63 bpm, pr int 174 ms,qrs int 136 ms, qtc 456 ms Epic records reviewed  Assessment and Plan: 1. Paroxymal afib Afib burden is low Continue 500 mg bid with stable QT Continue pradaxa, warned re bleeding risks with meloxicam Pt states he has  taken for years and no issues. Is also on a PPI. Bmet/mag today  Scheduled to see Dr. Caryl [name] in April F/u in the afib clinic  Next fall, sooner if needed  Clay. Travis [name], [facility] [address] [place], [place] [address] [phone]

## 2016-03-17 ENCOUNTER — Ambulatory Visit (INDEPENDENT_AMBULATORY_CARE_PROVIDER_SITE_OTHER): Payer: Medicare Other | Admitting: Family Medicine

## 2016-03-17 DIAGNOSIS — Z23 Encounter for immunization: Secondary | ICD-10-CM | POA: Diagnosis not present

## 2016-04-07 ENCOUNTER — Other Ambulatory Visit: Payer: Self-pay | Admitting: Family Medicine

## 2016-04-23 ENCOUNTER — Telehealth: Payer: Self-pay | Admitting: Family Medicine

## 2016-04-23 NOTE — Telephone Encounter (Signed)
Patient Name: Travis [name]  DOB: Mar 05, 1941    Initial Comment Started to ache on Sunday night, Monday it continued and started having diarrhea. The aching stopped and diarrhea has continued.    Nurse Assessment  Nurse: Raphael Gibney, RN, Vanita Ingles Date/Time (Eastern Time): 04/23/2016 9:49:14 AM  Confirm and document reason for call. If symptomatic, describe symptoms. You must click the next button to save text entered. ---Caller states he had body aches Sunday night and Monday am. Body aches stopped yesterday. No fever or chills. No vomiting. has 4-5 diarrhea stools in 24 hrs. Diarrhea started Monday am. He feels fine.  Does the patient have any new or worsening symptoms? ---Yes  Will a triage be completed? ---Yes  Related visit to physician within the last 2 weeks? ---No  Does the PT have any chronic conditions? (i.e. diabetes, asthma, etc.) ---No  Is this a behavioral health or substance abuse call? ---No     Guidelines    Guideline Title Affirmed Question Affirmed Notes  Diarrhea [1] MODERATE diarrhea (e.g., 4-6 times / day more than normal) AND [2] age > 69 years    Final Disposition User   See Physician within [address] [place], RN, Vanita Ingles    Comments  pt scheduled for appt at 8:30 am 04/24/2016 with Dr. [name]   Referrals  REFERRED TO PCP OFFICE   Disagree/Comply: Comply

## 2016-04-23 NOTE — Telephone Encounter (Signed)
Noted  

## 2016-04-24 ENCOUNTER — Ambulatory Visit: Payer: Self-pay | Admitting: Family Medicine

## 2016-04-25 NOTE — Telephone Encounter (Signed)
Pt cancelled appt

## 2016-04-26 ENCOUNTER — Other Ambulatory Visit: Payer: Self-pay | Admitting: Family Medicine

## 2016-05-03 ENCOUNTER — Other Ambulatory Visit: Payer: Self-pay | Admitting: Gastroenterology

## 2016-07-08 DIAGNOSIS — D3611 Benign neoplasm of peripheral nerves and autonomic nervous system of face, head, and neck: Secondary | ICD-10-CM | POA: Diagnosis not present

## 2016-07-08 DIAGNOSIS — L812 Freckles: Secondary | ICD-10-CM | POA: Diagnosis not present

## 2016-07-08 DIAGNOSIS — D225 Melanocytic nevi of trunk: Secondary | ICD-10-CM | POA: Diagnosis not present

## 2016-07-08 DIAGNOSIS — L218 Other seborrheic dermatitis: Secondary | ICD-10-CM | POA: Diagnosis not present

## 2016-07-08 DIAGNOSIS — L821 Other seborrheic keratosis: Secondary | ICD-10-CM | POA: Diagnosis not present

## 2016-07-08 DIAGNOSIS — D2339 Other benign neoplasm of skin of other parts of face: Secondary | ICD-10-CM | POA: Diagnosis not present

## 2016-07-08 DIAGNOSIS — D485 Neoplasm of uncertain behavior of skin: Secondary | ICD-10-CM | POA: Diagnosis not present

## 2016-07-18 ENCOUNTER — Other Ambulatory Visit: Payer: Self-pay | Admitting: Family Medicine

## 2016-07-31 ENCOUNTER — Encounter (INDEPENDENT_AMBULATORY_CARE_PROVIDER_SITE_OTHER): Payer: Self-pay

## 2016-07-31 ENCOUNTER — Encounter: Payer: Self-pay | Admitting: Family Medicine

## 2016-07-31 ENCOUNTER — Ambulatory Visit (INDEPENDENT_AMBULATORY_CARE_PROVIDER_SITE_OTHER): Payer: Medicare Other | Admitting: Family Medicine

## 2016-07-31 DIAGNOSIS — I10 Essential (primary) hypertension: Secondary | ICD-10-CM | POA: Diagnosis not present

## 2016-07-31 DIAGNOSIS — I480 Paroxysmal atrial fibrillation: Secondary | ICD-10-CM | POA: Diagnosis not present

## 2016-07-31 DIAGNOSIS — E785 Hyperlipidemia, unspecified: Secondary | ICD-10-CM | POA: Diagnosis not present

## 2016-07-31 LAB — COMPREHENSIVE METABOLIC PANEL
ALK PHOS: 67 U/L (ref 39–117)
ALT: 19 U/L (ref 0–53)
AST: 25 U/L (ref 0–37)
Albumin: 4.2 g/dL (ref 3.5–5.2)
BUN: 15 mg/dL (ref 6–23)
CO2: 28 mEq/L (ref 19–32)
CREATININE: 0.79 mg/dL (ref 0.40–1.50)
Calcium: 9.1 mg/dL (ref 8.4–10.5)
Chloride: 99 mEq/L (ref 96–112)
GFR: 101.3 mL/min (ref >60.00)
GLUCOSE: 117 mg/dL — AB (ref 70–99)
POTASSIUM: 4.3 meq/L (ref 3.5–5.1)
SODIUM: 132 meq/L — AB (ref 135–145)
TOTAL PROTEIN: 6.6 g/dL (ref 6.0–8.3)
Total Bilirubin: 0.8 mg/dL (ref 0.2–1.2)

## 2016-07-31 LAB — CBC
HEMATOCRIT: 44.4 % (ref 39.0–52.0)
Hemoglobin: 15.2 g/dL (ref 13.0–17.0)
MCHC: 34.2 g/dL (ref 30.0–36.0)
MCV: 102.9 fl — ABNORMAL HIGH (ref 78.0–100.0)
Platelets: 181 10*3/uL (ref 150.0–400.0)
RBC: 4.32 Mil/uL (ref 4.22–5.81)
RDW: 14.6 % (ref 11.5–15.5)
WBC: 5.1 10*3/uL (ref 4.0–10.5)

## 2016-07-31 LAB — LDL CHOLESTEROL, DIRECT: Direct LDL: 74 mg/dL

## 2016-07-31 MED ORDER — ATORVASTATIN CALCIUM 40 MG PO TABS: 40.0000 mg | ORAL_TABLET | ORAL | 3 refills | Status: DC

## 2016-07-31 NOTE — Progress Notes (Signed)
Pre visit review using our clinic review tool, if applicable. No additional management support is needed unless otherwise documented below in the visit note. 

## 2016-07-31 NOTE — Assessment & Plan Note (Signed)
S: suspect improved controlled on atorvastatin 40mg  once a week. No myalgias.  Lab Results  Component Value Date   CHOL 216 (H) 12/20/2014   HDL 69.40 12/20/2014   LDLCALC 128 (H) 12/20/2014   LDLDIRECT 128.1 02/09/2013   TRIG 93.0 12/20/2014   CHOLHDL 3 12/20/2014   A/P: update ldl today- perhaps twice a week as next step if LDL not under 100

## 2016-07-31 NOTE — Patient Instructions (Addendum)
Please stop by lab before you go  Need tetanus shot if you get a cut/scrape that makes you bleed  May try to go to twice a week atorvastatin if bad cholesterol remains above 100  I would also like for you to sign up for an annual wellness visit with our nurse, Manuela Schwartz, who specializes in the annual wellness exam. This is a free benefit under medicare that may help [place] find additional ways to help you. Some highlights are reviewing medications, lifestyle, and doing a dementia screen.   ______________________________________________________________________  Starting October 1st 2018, I will be transferring to our new location: [facility] (corner of [address] from [employer]) [place], [place] Phone: [phone]  I would love to have you remain my patient at this new location as long as it remains convenient for you. I am excited about the opportunity to have x-ray and sports medicine in the new building but will really miss the awesome staff and physicians at [place]. Continue to schedule appointments at [facility] and we will automatically transfer them to the [place] location starting October 1st.

## 2016-07-31 NOTE — Assessment & Plan Note (Signed)
S: on tikosyn and coreg. Does not appear to be in a fib at present. On pradaxa for anticoagulation A/P: once again discussed risk of mobic- states simply cannot tolerate day to day activities without this- would need to stop if bleeding or decrease in GFR. He is aware of risks

## 2016-07-31 NOTE — Assessment & Plan Note (Signed)
S: controlled on amlodipine 10mg , coreg 3.125mg  BID, telmisartan. Rare edema. .  BP Readings from Last 3 Encounters:  07/31/16 110/62  03/13/16 (!) 152/94  02/01/16 128/78  A/P:Continue current meds:  Doing very well

## 2016-07-31 NOTE — Progress Notes (Signed)
Subjective:  Travis [name] is a 76 y.o. year old very pleasant male patient who presents for/with See problem oriented charting ROS- No chest pain or shortness of breath. No headache or blurry vision. Insomnia but no issues if on temazepam   Past Medical History-  Patient Active Problem List   Diagnosis Date Noted  . Pulmonary hypertension 03/09/2012    Priority: High  . Bicuspid aortic valve     Priority: High  . Paroxysmal atrial fibrillation (HCC)     Priority: High  . Cardiomyopathy, rate related with resolution now recurrent 12/08/2011    Priority: High  . Hyperlipidemia 12/19/2014    Priority: Medium  . Anxiety state 03/06/2014    Priority: Medium  . Insomnia 03/06/2014    Priority: Medium  . Sinus bradycardia 01/04/2013    Priority: Medium  . Hyponatremia 02/21/2012    Priority: Medium  . Hypertension     Priority: Medium  . Left bundle branch block 12/10/2010    Priority: Medium  . Macrocytic anemia 08/01/2015    Priority: Low  . Male pattern baldness 03/06/2014    Priority: Low  . Overweight (BMI 25.0-29.9) 02/08/2014    Priority: Low  . S/P TKR (total knee replacement) 02/08/2014    Priority: Low  . DRY MOUTH 11/07/2009    Priority: Low  . Carotid bruit 07/05/2009    Priority: Low  . GERD 01/07/2008    Priority: Low  . Osteoarthritis 02/04/2007    Priority: Low  . GANGLION CYST, WRIST, LEFT 02/04/2007    Priority: Low    Medications- reviewed and updated Current Outpatient Prescriptions  Medication Sig Dispense Refill  . acetaminophen (TYLENOL) 500 MG tablet Take 1,000 mg by mouth every 6 (six) hours as needed for mild pain or moderate pain. Reported on 08/01/2015    . ALPRAZolam (XANAX) 0.5 MG tablet Take 1 tablet (0.5 mg total) by mouth 2 (two) times daily as needed for anxiety (separate by 8 hours from temazepam). 5 tablet 0  . amLODipine (NORVASC) 10 MG tablet TAKE 1 TABLET(10 MG) BY MOUTH DAILY 90 tablet 1  . b complex vitamins tablet Take 1 tablet  by mouth daily.     . carvedilol (COREG) 3.125 MG tablet Take 1 tablet (3.125 mg total) by mouth 2 (two) times daily. 180 tablet 3  . dofetilide (TIKOSYN) 500 MCG capsule TAKE ONE CAPSULE BY MOUTH EVERY 12 HOURS 180 capsule 3  . finasteride (PROPECIA) 1 MG tablet TAKE 1 TABLET(1 MG) BY MOUTH DAILY 30 tablet 5  . fish oil-omega-3 fatty acids 1000 MG capsule Take 1,000 mg by mouth daily.     . Flaxseed, Linseed, (RA FLAX SEED OIL 1000 PO) Take 1,000 mg by mouth 3 (three) times daily.     . meloxicam (MOBIC) 15 MG tablet TAKE 1 TABLET BY MOUTH DAILY 90 tablet 0  . omeprazole (PRILOSEC) 20 MG capsule TAKE ONE CAPSULE BY MOUTH EVERY DAY 90 capsule 0  . PRADAXA 150 MG CAPS capsule TAKE 1 CAPSULE(150 MG) BY MOUTH TWICE DAILY 60 capsule 11  . telmisartan (MICARDIS) 80 MG tablet TAKE 1 TABLET BY MOUTH EVERY NIGHT AT BEDTIME 30 tablet 5  . temazepam (RESTORIL) 15 MG capsule TAKE ONE CAPSULE BY MOUTH AT BEDTIME AS NEEDED 30 capsule 5  . atorvastatin (LIPITOR) 40 MG tablet Take 1 tablet (40 mg total) by mouth once a week. 14 tablet 3   No current facility-administered medications for this visit.     Objective: BP 110/62 (  BP Location: Left Arm, Patient Position: Sitting, Cuff Size: Large)   Pulse (!) 50   Temp 97.8 F (36.6 C) (Oral)   Ht 5\' 8"  (1.727 m)   Wt 181 lb (82.1 kg)   SpO2 96%   BMI 27.52 kg/m  Gen: NAD, resting comfortably Mucous membranes are moist. CV: regular rate but bradycardic no murmurs.  Lungs: CTAB no crackles, wheeze, rhonchi Ext: no edema Skin: warm, dry, no rash  Assessment/Plan:  Hypertension S: controlled on amlodipine 10mg , coreg 3.125mg  BID, telmisartan. Rare edema. .  BP Readings from Last 3 Encounters:  07/31/16 110/62  03/13/16 (!) 152/94  02/01/16 128/78  A/P:Continue current meds:  Doing very well  Hyperlipidemia S: suspect improved controlled on atorvastatin 40mg  once a week. No myalgias.  Lab Results  Component Value Date   CHOL 216 (H) 12/20/2014    HDL 69.40 12/20/2014   LDLCALC 128 (H) 12/20/2014   LDLDIRECT 128.1 02/09/2013   TRIG 93.0 12/20/2014   CHOLHDL 3 12/20/2014   A/P: update ldl today- perhaps twice a week as next step if LDL not under 100   Paroxysmal atrial fibrillation (HCC) S: on tikosyn and coreg. Does not appear to be in a fib at present. On pradaxa for anticoagulation A/P: once again discussed risk of mobic- states simply cannot tolerate day to day activities without this- would need to stop if bleeding or decrease in GFR. He is aware of risks    Return in about 6 months (around 01/31/2017) for annual wellness visit. can see me that day or within a few days. .  Orders Placed This Encounter  Procedures  . CBC    [place]  . Comprehensive metabolic panel    [place]  . LDL cholesterol, direct    [place]    Meds ordered this encounter  Medications  . atorvastatin (LIPITOR) 40 MG tablet    Sig: Take 1 tablet (40 mg total) by mouth once a week.    Dispense:  14 tablet    Refill:  3    Return precautions advised.  [name], MD

## 2016-08-06 ENCOUNTER — Other Ambulatory Visit: Payer: Self-pay | Admitting: Internal Medicine

## 2016-08-15 ENCOUNTER — Encounter: Payer: Self-pay | Admitting: Gastroenterology

## 2016-08-15 ENCOUNTER — Ambulatory Visit (INDEPENDENT_AMBULATORY_CARE_PROVIDER_SITE_OTHER): Payer: Medicare Other | Admitting: Gastroenterology

## 2016-08-15 VITALS — BP 130/80 | HR 78 | Resp 18 | Ht 68.0 in | Wt 180.0 lb

## 2016-08-15 DIAGNOSIS — K219 Gastro-esophageal reflux disease without esophagitis: Secondary | ICD-10-CM

## 2016-08-15 MED ORDER — OMEPRAZOLE 20 MG PO CPDR: 20.0000 mg | DELAYED_RELEASE_CAPSULE | Freq: Every day | ORAL | 0 refills | Status: DC

## 2016-08-15 NOTE — Patient Instructions (Signed)
Omeprazole refilled today. Call if any concerning GI symptoms.

## 2016-08-15 NOTE — Progress Notes (Signed)
Review of pertinent gastrointestinal problems:  1. Routine risk for colon cancer. colonoscopy October 2007 found diverticulosis, no polyps or cancers. N  2. personal history of gastric ulcer, likely NSAID related. Fall 2007 had 2 endoscopies, the second EGD proved that the previous gastric ulcer had healed.  3. GERD, well treated with once daily proton pump inhibitor   HPI: This is a very pleasant 76 year old man  whom I last saw a little over 3 years ago  Chief complaint is chronic GERD  Has been taking omeprazole 20mg  one pill and on that regimen he feels great.  No pyrosis.  NO dysphagia.  No bowel changes.  He has been taking 1/2 pill imodium every other day.  He has no overt GI bleeding  Review of systems: Pertinent positive and negative review of systems were noted in the above HPI section. Complete review of systems was performed and was otherwise normal.   Past Medical History:  Diagnosis Date  . Arthritis   . Bicuspid aortic valve   . Cardiomyopathy -resolved    tachycardia-induced, EF 55% June 2009  . CHF (congestive heart failure) ([place])    JULY 2014  . GERD (gastroesophageal reflux disease)   . History of cardioversion 12/16/2012  . History of stomach ulcers   . Hypertension   . Left bundle branch block   . Paroxysmal atrial fibrillation (HCC)   . Sinus bradycardia   . Status post clamping of cerebral aneurysm 79    Past Surgical History:  Procedure Laterality Date  . CARDIAC CATHETERIZATION    . CARDIOVERSION N/A 12/16/2012   Procedure: CARDIOVERSION;  Surgeon: Lelon Perla, MD;  Location[facility]COPY;  Service: Cardiovascular;  Laterality: N/A;  . CATARACT EXTRACTION  05/2015   bilateral  . cerebral anuersym post clips    . fractured left arm    . HERNIA REPAIR     lft  . INGUINAL HERNIA REPAIR  07/02/2011   Procedure: LAPAROSCOPIC INGUINAL HERNIA;  Surgeon: Harl Bowie, MD;  Location: [facility];  Service: General;  Laterality: Left;  Laparoscopic left  inguinal hernia repair and mesh  . left tendon repair     lft foot  . ROTATOR CUFF REPAIR     lf  . TONSILLECTOMY    . TOTAL KNEE ARTHROPLASTY Bilateral 02/06/2014   Procedure: TOTAL KNEE BILATERAL;  Surgeon: Mauri Pole, MD;  Location: [facility];  Service: Orthopedics;  Laterality: Bilateral;  . WRIST GANGLION EXCISION     lft    Current Outpatient Prescriptions  Medication Sig Dispense Refill  . acetaminophen (TYLENOL) 500 MG tablet Take 1,000 mg by mouth every 6 (six) hours as needed for mild pain or moderate pain. Reported on 08/01/2015    . ALPRAZolam (XANAX) 0.5 MG tablet Take 1 tablet (0.5 mg total) by mouth 2 (two) times daily as needed for anxiety (separate by 8 hours from temazepam). 5 tablet 0  . amLODipine (NORVASC) 10 MG tablet TAKE 1 TABLET(10 MG) BY MOUTH DAILY 90 tablet 1  . atorvastatin (LIPITOR) 40 MG tablet Take 1 tablet (40 mg total) by mouth once a week. 14 tablet 3  . b complex vitamins tablet Take 1 tablet by mouth daily.     . carvedilol (COREG) 3.125 MG tablet Take 1 tablet (3.125 mg total) by mouth 2 (two) times daily. 180 tablet 3  . dofetilide (TIKOSYN) 500 MCG capsule TAKE ONE CAPSULE BY MOUTH EVERY 12 HOURS 180 capsule 0  . finasteride (PROPECIA) 1 MG tablet  TAKE 1 TABLET(1 MG) BY MOUTH DAILY 30 tablet 5  . fish oil-omega-3 fatty acids 1000 MG capsule Take 1,000 mg by mouth daily.     . Flaxseed, Linseed, (RA FLAX SEED OIL 1000 PO) Take 1,000 mg by mouth 3 (three) times daily.     . meloxicam (MOBIC) 15 MG tablet TAKE 1 TABLET BY MOUTH DAILY 90 tablet 0  . omeprazole (PRILOSEC) 20 MG capsule TAKE ONE CAPSULE BY MOUTH EVERY DAY 90 capsule 0  . PRADAXA 150 MG CAPS capsule TAKE 1 CAPSULE(150 MG) BY MOUTH TWICE DAILY 60 capsule 11  . telmisartan (MICARDIS) 80 MG tablet TAKE 1 TABLET BY MOUTH EVERY NIGHT AT BEDTIME 30 tablet 5  . temazepam (RESTORIL) 15 MG capsule TAKE ONE CAPSULE BY MOUTH AT BEDTIME AS NEEDED 30 capsule 5   No current facility-administered  medications for this visit.     Allergies as of 08/15/2016  . (No Known Allergies)    Family History  Problem Relation Age of Onset  . Stroke Father 66    smoker  . Lung cancer Mother 51    former smoker  . Stroke Mother   . Colon cancer Neg Hx   . Stroke Brother     Social History   Social History  . Marital status: Single    Spouse name: N/A  . Number of children: 0  . Years of education: N/A   Occupational History  . Retired    Social History Main Topics  . Smoking status: Former Smoker    Packs/day: 1.50    Years: 25.00    Quit date: 05/26/1986  . Smokeless tobacco: Never Used  . Alcohol use 0.5 oz/week    1 drink(s) per week     Comment: 1 martini daily   . Drug use: No  . Sexual activity: Not Currently   Other Topics Concern  . Not on file   Social History Narrative   Homosexual. Lives alone. Not sexually active.       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)      Hobbies: bridge, formerly tennis hoping to get back, walking     Physical Exam: BP 130/80   Pulse 78   Resp 18   Ht 5\' 8"  (1.727 m)   Wt 180 lb (81.6 kg)   BMI 27.37 kg/m  Constitutional: generally well-appearing Psychiatric: alert and oriented x3 Eyes: extraocular movements intact Mouth: oral pharynx moist, no lesions Neck: supple no lymphadenopathy Cardiovascular: heart regular rate and rhythm Lungs: clear to auscultation bilaterally Abdomen: soft, nontender, nondistended, no obvious ascites, no peritoneal signs, normal bowel sounds Extremities: no lower extremity edema bilaterally Skin: no lesions on visible extremities   Assessment and plan: 76 y.o. male with  Chronic GERD without alarm symptoms  I am happy to refill his omeprazole 20 mg. He will continue to one pill once daily. I will happily refill this again in a year from now without need for office visit but if he still requires prescription omeprazole in 2 years for now I would like to see  him again. We discussed colon cancer screening. He understands that this generally stops between the ages of 38 and 40 for most people. He is very fit and I do think colon cancer screening is still a reasonable clinical question for him however he is not at all interested in that. He is not interested in colonoscopy or stool based testing. He does understand that if he has any  changes in his bowels or significant GI symptoms to call at any time.  Please see the "Patient Instructions" section for addition details about the plan.   Owens Loffler, MD [facility] 08/15/2016, 3:45 PM  Cc: [name], MD

## 2016-08-24 ENCOUNTER — Other Ambulatory Visit: Payer: Self-pay | Admitting: Internal Medicine

## 2016-09-04 ENCOUNTER — Ambulatory Visit: Payer: Medicare Other | Admitting: Internal Medicine

## 2016-09-05 ENCOUNTER — Other Ambulatory Visit: Payer: Self-pay | Admitting: Family Medicine

## 2016-09-14 ENCOUNTER — Other Ambulatory Visit: Payer: Self-pay | Admitting: Internal Medicine

## 2016-09-15 ENCOUNTER — Other Ambulatory Visit: Payer: Self-pay | Admitting: Internal Medicine

## 2016-09-15 ENCOUNTER — Ambulatory Visit: Payer: Medicare Other

## 2016-09-15 ENCOUNTER — Other Ambulatory Visit: Payer: Self-pay | Admitting: *Deleted

## 2016-09-15 DIAGNOSIS — Z23 Encounter for immunization: Secondary | ICD-10-CM

## 2016-09-15 DIAGNOSIS — I6523 Occlusion and stenosis of bilateral carotid arteries: Secondary | ICD-10-CM

## 2016-09-15 MED ORDER — ZOSTER VAC RECOMB ADJUVANTED 50 MCG/0.5ML IM SUSR: 0.5000 mL | Freq: Once | INTRAMUSCULAR | 1 refills | Status: AC

## 2016-09-15 NOTE — Telephone Encounter (Signed)
Patient needs written prescription for Shringrix due to insurance; patient to get immunization from pharmacy.

## 2016-09-15 NOTE — Telephone Encounter (Signed)
Saw Afib clinic on 02/2016 with Roderic Palau, NP. Current wt 180 lb. Serum creatine was  0.79 on 07/31/16. Creatine clearance 91.87 mL/min. Pradaxa 150mg  reordered.

## 2016-09-17 ENCOUNTER — Other Ambulatory Visit: Payer: Self-pay

## 2016-09-17 ENCOUNTER — Encounter (HOSPITAL_COMMUNITY): Payer: Medicare Other

## 2016-09-17 ENCOUNTER — Ambulatory Visit (HOSPITAL_COMMUNITY)
Admission: RE | Admit: 2016-09-17 | Discharge: 2016-09-17 | Disposition: A | Discharge status: Home / Self Care | Payer: Medicare Other | Source: Ambulatory Visit | Attending: Cardiovascular Disease | Admitting: Cardiovascular Disease

## 2016-09-17 ENCOUNTER — Ambulatory Visit (HOSPITAL_COMMUNITY): Payer: Medicare Other | Attending: Cardiology

## 2016-09-17 DIAGNOSIS — I119 Hypertensive heart disease without heart failure: Secondary | ICD-10-CM | POA: Insufficient documentation

## 2016-09-17 DIAGNOSIS — I2510 Atherosclerotic heart disease of native coronary artery without angina pectoris: Secondary | ICD-10-CM | POA: Diagnosis not present

## 2016-09-17 DIAGNOSIS — Z87891 Personal history of nicotine dependence: Secondary | ICD-10-CM | POA: Diagnosis not present

## 2016-09-17 DIAGNOSIS — I340 Nonrheumatic mitral (valve) insufficiency: Secondary | ICD-10-CM | POA: Diagnosis not present

## 2016-09-17 DIAGNOSIS — I6523 Occlusion and stenosis of bilateral carotid arteries: Secondary | ICD-10-CM | POA: Diagnosis not present

## 2016-09-17 DIAGNOSIS — I480 Paroxysmal atrial fibrillation: Secondary | ICD-10-CM | POA: Diagnosis not present

## 2016-09-17 DIAGNOSIS — I352 Nonrheumatic aortic (valve) stenosis with insufficiency: Secondary | ICD-10-CM | POA: Diagnosis not present

## 2016-09-17 DIAGNOSIS — I6502 Occlusion and stenosis of left vertebral artery: Secondary | ICD-10-CM | POA: Insufficient documentation

## 2016-09-17 DIAGNOSIS — I10 Essential (primary) hypertension: Secondary | ICD-10-CM | POA: Insufficient documentation

## 2016-09-17 DIAGNOSIS — I77810 Thoracic aortic ectasia: Secondary | ICD-10-CM | POA: Insufficient documentation

## 2016-09-23 ENCOUNTER — Encounter: Payer: Self-pay | Admitting: Internal Medicine

## 2016-09-23 ENCOUNTER — Ambulatory Visit (INDEPENDENT_AMBULATORY_CARE_PROVIDER_SITE_OTHER): Payer: Medicare Other | Admitting: Internal Medicine

## 2016-09-23 VITALS — BP 148/80 | HR 56 | Ht 68.0 in | Wt 179.0 lb

## 2016-09-23 DIAGNOSIS — I429 Cardiomyopathy, unspecified: Secondary | ICD-10-CM

## 2016-09-23 DIAGNOSIS — I480 Paroxysmal atrial fibrillation: Secondary | ICD-10-CM

## 2016-09-23 DIAGNOSIS — I6523 Occlusion and stenosis of bilateral carotid arteries: Secondary | ICD-10-CM

## 2016-09-23 DIAGNOSIS — Q231 Congenital insufficiency of aortic valve: Secondary | ICD-10-CM

## 2016-09-23 NOTE — Progress Notes (Signed)
Patient Care Team: [name], MD as PCP - General (Family Medicine)   HPI  Travis [name] is a 76 y.o. male Seen in followup for paroxysmal atrial fibrillation and tachycardia-induced and resolved cardiomyopathy seen in followup today.   He ended up with congestive heart failure 7/14 with an ejection fraction of 35%. Propafenone was discontinued and dofetilide was initiated. Echocardiogram 10/14 demonstrated normalization of LV function.  The patient denies chest pain, shortness of breath, nocturnal dyspnea, orthopnea or peripheral edema.  There have been 3 interval episodes of atrial fibrillation for a day or so, each has been associated with antibiotics. There has been no lightheadedness or syncope. He is working out with a trainer 3 times a week  He also has a probable bicuspid aortic valve with mild stenosis; repeat echocardiogram 3/16 demonstrated stable parameters. LV systolic function was normal---  echocardiogram 4/18 mean gradient 15; LV function stable     Date Cr K Mg  3/18  0.79 4.3 2.0      Past Medical History:  Diagnosis Date  . Arthritis   . Bicuspid aortic valve   . Cardiomyopathy -resolved    tachycardia-induced, EF 55% June 2009  . CHF (congestive heart failure) ([place])    JULY 2014  . GERD (gastroesophageal reflux disease)   . History of cardioversion 12/16/2012  . History of stomach ulcers   . Hypertension   . Left bundle branch block   . Paroxysmal atrial fibrillation (HCC)   . Sinus bradycardia   . Status post clamping of cerebral aneurysm 79    Past Surgical History:  Procedure Laterality Date  . CARDIAC CATHETERIZATION    . CARDIOVERSION N/A 12/16/2012   Procedure: CARDIOVERSION;  Surgeon: Lelon Perla, MD;  Location: [facility] ENDOSCOPY;  Service: Cardiovascular;  Laterality: N/A;  . CATARACT EXTRACTION  05/2015   bilateral  . cerebral anuersym post clips    . fractured left arm    . HERNIA REPAIR     lft  . INGUINAL HERNIA REPAIR  07/02/2011    Procedure: LAPAROSCOPIC INGUINAL HERNIA;  Surgeon: Harl Bowie, MD;  Location: [facility];  Service: General;  Laterality: Left;  Laparoscopic left inguinal hernia repair and mesh  . left tendon repair     lft foot  . ROTATOR CUFF REPAIR     lf  . TONSILLECTOMY    . TOTAL KNEE ARTHROPLASTY Bilateral 02/06/2014   Procedure: TOTAL KNEE BILATERAL;  Surgeon: Mauri Pole, MD;  Location: [facility];  Service: Orthopedics;  Laterality: Bilateral;  . WRIST GANGLION EXCISION     lft    Current Outpatient Prescriptions  Medication Sig Dispense Refill  . acetaminophen (TYLENOL) 500 MG tablet Take 1,000 mg by mouth every 6 (six) hours as needed for mild pain or moderate pain. Reported on 08/01/2015    . ALPRAZolam (XANAX) 0.5 MG tablet Take 1 tablet (0.5 mg total) by mouth 2 (two) times daily as needed for anxiety (separate by 8 hours from temazepam). 5 tablet 0  . amLODipine (NORVASC) 10 MG tablet TAKE 1 TABLET(10 MG) BY MOUTH DAILY 90 tablet 0  . atorvastatin (LIPITOR) 40 MG tablet Take 1 tablet (40 mg total) by mouth once a week. 14 tablet 3  . b complex vitamins tablet Take 1 tablet by mouth daily.     . carvedilol (COREG) 3.125 MG tablet TAKE 1 TABLET(3.125 MG) BY MOUTH TWICE DAILY 180 tablet 0  . dofetilide (TIKOSYN) 500 MCG capsule TAKE ONE CAPSULE BY MOUTH  EVERY 12 HOURS 180 capsule 0  . finasteride (PROPECIA) 1 MG tablet TAKE 1 TABLET(1 MG) BY MOUTH DAILY 30 tablet 5  . fish oil-omega-3 fatty acids 1000 MG capsule Take 1,000 mg by mouth daily.     . Flaxseed, Linseed, (RA FLAX SEED OIL 1000 PO) Take 1,000 mg by mouth 3 (three) times daily.     . meloxicam (MOBIC) 15 MG tablet TAKE 1 TABLET BY MOUTH DAILY 90 tablet 0  . omeprazole (PRILOSEC) 20 MG capsule Take 1 capsule (20 mg total) by mouth daily. 90 capsule 0  . PRADAXA 150 MG CAPS capsule TAKE 1 CAPSULE(150 MG) BY MOUTH TWICE DAILY 60 capsule 5  . telmisartan (MICARDIS) 80 MG tablet TAKE 1 TABLET BY MOUTH EVERY NIGHT AT BEDTIME 30 tablet 5   . temazepam (RESTORIL) 15 MG capsule TAKE ONE CAPSULE BY MOUTH AT BEDTIME AS NEEDED 30 capsule 5   No current facility-administered medications for this visit.     No Known Allergies  Review of Systems negative except from HPI and PMH  Physical Exam BP (!) 148/80   Pulse (!) 56   Ht 5\' 8"  (1.727 m)   Wt 179 lb (81.2 kg)   SpO2 96%   BMI 27.22 kg/m  Well developed and well nourished in no acute distress HENT normal E scleral and icterus clear Neck Supple JVP flat; carotids brisk and full Clear to ausculation  slow Regular rate and rhythm, 2/6 murmur Soft with active bowel sounds No clubbing cyanosis none Edema Alert and oriented, grossly normal motor and sensory function Skin Warm and Dry  ECG ordered today and  demonstrates  Sinus at 53  Intervals 15/15/47 Axis left -56 Isolated PVC     Assessment and  Plan  Atrial fibrillation-paroxysmal holding sinus rhythm on dofetilide.  He remains on dabigitran  Cardiomyopathy-tachycardia mediated-resolved Continue beta blockers and ARB  Bicuspid aortic valve-    Hypertension  BP well controlled at home His aortic stenosis/bicuspid valve remained stable. Echo was reviewed and we will plan to recheck in 2 years Atrial fibrillation inteval events-self terminating Continue current meds with dofetilide;  On Anticoagulation;  No bleeding issues     I will have him follow-up with the [facility]. fib clinic in 6 months and I will see him in 12 months.

## 2016-09-23 NOTE — Patient Instructions (Signed)
Medication Instructions: - Your physician recommends that you continue on your current medications as directed. Please refer to the Current Medication list given to you today.  Labwork: - none ordered  Procedures/Testing: - none ordered  Follow-Up: - Your physician wants you to follow-up in: 6 months with Roderic Palau, NP in the [facility] 1 year with [name].  You will receive a reminder letter in the mail two months in advance. If you don't receive a letter, please call our office to schedule the follow-up appointment.   Any Additional Special Instructions Will Be Listed Below (If Applicable).     If you need a refill on your cardiac medications before your next appointment, please call your pharmacy.

## 2016-09-30 ENCOUNTER — Other Ambulatory Visit: Payer: Self-pay | Admitting: Family Medicine

## 2016-10-12 ENCOUNTER — Other Ambulatory Visit: Payer: Self-pay | Admitting: Family Medicine

## 2016-10-13 DIAGNOSIS — Z961 Presence of intraocular lens: Secondary | ICD-10-CM | POA: Diagnosis not present

## 2016-10-15 ENCOUNTER — Other Ambulatory Visit: Payer: Self-pay | Admitting: Family Medicine

## 2016-10-23 ENCOUNTER — Other Ambulatory Visit: Payer: Self-pay | Admitting: Family Medicine

## 2016-10-27 ENCOUNTER — Other Ambulatory Visit: Payer: Self-pay | Admitting: Internal Medicine

## 2016-11-08 ENCOUNTER — Other Ambulatory Visit: Payer: Self-pay | Admitting: Gastroenterology

## 2016-11-22 ENCOUNTER — Other Ambulatory Visit: Payer: Self-pay | Admitting: Internal Medicine

## 2016-12-05 ENCOUNTER — Other Ambulatory Visit: Payer: Self-pay | Admitting: Family Medicine

## 2017-01-10 ENCOUNTER — Other Ambulatory Visit: Payer: Self-pay | Admitting: Family Medicine

## 2017-01-24 ENCOUNTER — Other Ambulatory Visit: Payer: Self-pay | Admitting: Family Medicine

## 2017-02-03 ENCOUNTER — Ambulatory Visit (INDEPENDENT_AMBULATORY_CARE_PROVIDER_SITE_OTHER): Payer: Medicare Other

## 2017-02-03 VITALS — BP 134/80 | HR 55 | Ht 68.0 in | Wt 180.0 lb

## 2017-02-03 DIAGNOSIS — Z0000 Encounter for general adult medical examination without abnormal findings: Secondary | ICD-10-CM

## 2017-02-03 DIAGNOSIS — Z23 Encounter for immunization: Secondary | ICD-10-CM

## 2017-02-03 NOTE — Patient Instructions (Addendum)
Travis [name] , Thank you for taking time to come for your Medicare Wellness Visit. I appreciate your ongoing commitment to your health goals. Please review the following plan we discussed and let me know if I can assist you in the future.    Will consider Abd Aortic Aneurysm check due to [place] preventive task force recommendation  A Tetanus is recommended every 10 years. Medicare covers a tetanus if you have a cut or wound; otherwise, there may be a charge. If you had not had a tetanus with pertusses, known as the Tdap, you can take this anytime.   Will take your flu vaccine today  Signed up for [facility] as a secondary plan    These are the goals we discussed: Goals    . patient          Will maintain his health         This is a list of the screening recommended for you and due dates:  Health Maintenance  Topic Date Due  . Tetanus Vaccine  04/17/2014  . Flu Shot  12/24/2016  . Pneumonia vaccines  Completed    Prevention of falls: Remove rugs or any tripping hazards in the home Use Non slip mats in bathtubs and showers Placing grab bars next to the toilet and or shower Placing handrails on both sides of the stair way Adding extra lighting in the home.   Personal safety issues reviewed:  1. Consider starting a community watch program per [facility] 2.  Changes batteries is smoke detector and/or carbon monoxide detector  3.  If you have firearms; keep them in a safe place 4.  Wear protection when in the sun; Always wear sunscreen or a hat; It is good to have your doctor check your skin annually or review any new areas of concern 5. Driving safety; Keep in the right lane; stay 3 car lengths behind the car in front of you on the highway; look 3 times prior to pulling out; carry your cell phone everywhere you go!    Learn about the Yellow Dot program:  The program allows first responders at your emergency to have access to who your physician is, as well as your  medications and medical conditions.  Citizens requesting the Yellow Dot Packages should contact Master Corporal Nunzio Cobbs at the [facility] [phone] for the first week of the program and beginning the week after Easter citizens should contact their Scientist, physiological.        Fall Prevention in the Home Falls can cause injuries. They can happen to people of all ages. There are many things you can do to make your home safe and to help prevent falls. What can I do on the outside of my home?  Regularly fix the edges of walkways and driveways and fix any cracks.  Remove anything that might make you trip as you walk through a door, such as a raised step or threshold.  Trim any bushes or trees on the path to your home.  Use bright outdoor lighting.  Clear any walking paths of anything that might make someone trip, such as rocks or tools.  Regularly check to see if handrails are loose or broken. Make sure that both sides of any steps have handrails.  Any raised decks and porches should have guardrails on the edges.  Have any leaves, snow, or ice cleared regularly.  Use sand or salt on walking paths during winter.  Clean  up any spills in your garage right away. This includes oil or grease spills. What can I do in the bathroom?  Use night lights.  Install grab bars by the toilet and in the tub and shower. Do not use towel bars as grab bars.  Use non-skid mats or decals in the tub or shower.  If you need to sit down in the shower, use a plastic, non-slip stool.  Keep the floor dry. Clean up any water that spills on the floor as soon as it happens.  Remove soap buildup in the tub or shower regularly.  Attach bath mats securely with double-sided non-slip rug tape.  Do not have throw rugs and other things on the floor that can make you trip. What can I do in the bedroom?  Use night lights.  Make sure that you have a light by your bed  that is easy to reach.  Do not use any sheets or blankets that are too big for your bed. They should not hang down onto the floor.  Have a firm chair that has side arms. You can use this for support while you get dressed.  Do not have throw rugs and other things on the floor that can make you trip. What can I do in the kitchen?  Clean up any spills right away.  Avoid walking on wet floors.  Keep items that you use a lot in easy-to-reach places.  If you need to reach something above you, use a strong step stool that has a grab bar.  Keep electrical cords out of the way.  Do not use floor polish or wax that makes floors slippery. If you must use wax, use non-skid floor wax.  Do not have throw rugs and other things on the floor that can make you trip. What can I do with my stairs?  Do not leave any items on the stairs.  Make sure that there are handrails on both sides of the stairs and use them. Fix handrails that are broken or loose. Make sure that handrails are as long as the stairways.  Check any carpeting to make sure that it is firmly attached to the stairs. Fix any carpet that is loose or worn.  Avoid having throw rugs at the top or bottom of the stairs. If you do have throw rugs, attach them to the floor with carpet tape.  Make sure that you have a light switch at the top of the stairs and the bottom of the stairs. If you do not have them, ask someone to add them for you. What else can I do to help prevent falls?  Wear shoes that: ? Do not have high heels. ? Have rubber bottoms. ? Are comfortable and fit you well. ? Are closed at the toe. Do not wear sandals.  If you use a stepladder: ? Make sure that it is fully opened. Do not climb a closed stepladder. ? Make sure that both sides of the stepladder are locked into place. ? Ask someone to hold it for you, if possible.  Clearly mark and make sure that you can see: ? Any grab bars or handrails. ? First and last  steps. ? Where the edge of each step is.  Use tools that help you move around (mobility aids) if they are needed. These include: ? Canes. ? Walkers. ? Scooters. ? Crutches.  Turn on the lights when you go into a dark area. Replace any light bulbs as soon as  they burn out.  Set up your furniture so you have a clear path. Avoid moving your furniture around.  If any of your floors are uneven, fix them.  If there are any pets around you, be aware of where they are.  Review your medicines with your doctor. Some medicines can make you feel dizzy. This can increase your chance of falling. Ask your doctor what other things that you can do to help prevent falls. This information is not intended to replace advice given to you by your health care provider. Make sure you discuss any questions you have with your health care provider. Document Released: 03/08/2009 Document Revised: 10/18/2015 Document Reviewed: 06/16/2014 Elsevier Interactive Patient Education  2018 [employer] Maintenance, Male A healthy lifestyle and preventive care is important for your health and wellness. Ask your health care provider about what schedule of regular examinations is right for you. What should I know about weight and diet? Eat a Healthy Diet  Eat plenty of vegetables, fruits, whole grains, low-fat dairy products, and lean protein.  Do not eat a lot of foods high in solid fats, added sugars, or salt.  Maintain a Healthy Weight Regular exercise can help you achieve or maintain a healthy weight. You should:  Do at least 150 minutes of exercise each week. The exercise should increase your heart rate and make you sweat (moderate-intensity exercise).  Do strength-training exercises at least twice a week.  Watch Your Levels of Cholesterol and Blood Lipids  Have your blood tested for lipids and cholesterol every 5 years starting at 77 years of age. If you are at high risk for heart disease, you  should start having your blood tested when you are 77 years old. You may need to have your cholesterol levels checked more often if: ? Your lipid or cholesterol levels are high. ? You are older than 77 years of age. ? You are at high risk for heart disease.  What should I know about cancer screening? Many types of cancers can be detected early and may often be prevented. Lung Cancer  You should be screened every year for lung cancer if: ? You are a current smoker who has smoked for at least 30 years. ? You are a former smoker who has quit within the past 15 years.  Talk to your health care provider about your screening options, when you should start screening, and how often you should be screened.  Colorectal Cancer  Routine colorectal cancer screening usually begins at 77 years of age and should be repeated every 5-10 years until you are 77 years old. You may need to be screened more often if early forms of precancerous polyps or small growths are found. Your health care provider may recommend screening at an earlier age if you have risk factors for colon cancer.  Your health care provider may recommend using home test kits to check for hidden blood in the stool.  A small camera at the end of a tube can be used to examine your colon (sigmoidoscopy or colonoscopy). This checks for the earliest forms of colorectal cancer.  Prostate and Testicular Cancer  Depending on your age and overall health, your health care provider may do certain tests to screen for prostate and testicular cancer.  Talk to your health care provider about any symptoms or concerns you have about testicular or prostate cancer.  Skin Cancer  Check your skin from head to toe regularly.  Tell your health care  provider about any new moles or changes in moles, especially if: ? There is a change in a mole's size, shape, or color. ? You have a mole that is larger than a pencil eraser.  Always use sunscreen. Apply  sunscreen liberally and repeat throughout the day.  Protect yourself by wearing long sleeves, pants, a wide-brimmed hat, and sunglasses when outside.  What should I know about heart disease, diabetes, and high blood pressure?  If you are 48-42 years of age, have your blood pressure checked every 3-5 years. If you are 68 years of age or older, have your blood pressure checked every year. You should have your blood pressure measured twice-once when you are at a hospital or clinic, and once when you are not at a hospital or clinic. Record the average of the two measurements. To check your blood pressure when you are not at a hospital or clinic, you can use: ? An automated blood pressure machine at a pharmacy. ? A home blood pressure monitor.  Talk to your health care provider about your target blood pressure.  If you are between 88-34 years old, ask your health care provider if you should take aspirin to prevent heart disease.  Have regular diabetes screenings by checking your fasting blood sugar level. ? If you are at a normal weight and have a low risk for diabetes, have this test once every three years after the age of 99. ? If you are overweight and have a high risk for diabetes, consider being tested at a younger age or more often.  A one-time screening for abdominal aortic aneurysm (AAA) by ultrasound is recommended for men aged 3-75 years who are current or former smokers. What should I know about preventing infection? Hepatitis B If you have a higher risk for hepatitis B, you should be screened for this virus. Talk with your health care provider to find out if you are at risk for hepatitis B infection. Hepatitis C Blood testing is recommended for:  Everyone born from [birth year] through 1966.  Anyone with known risk factors for hepatitis C.  Sexually Transmitted Diseases (STDs)  You should be screened each year for STDs including gonorrhea and chlamydia if: ? You are sexually active  and are younger than 77 years of age. ? You are older than 77 years of age and your health care provider tells you that you are at risk for this type of infection. ? Your sexual activity has changed since you were last screened and you are at an increased risk for chlamydia or gonorrhea. Ask your health care provider if you are at risk.  Talk with your health care provider about whether you are at high risk of being infected with HIV. Your health care provider may recommend a prescription medicine to help prevent HIV infection.  What else can I do?  Schedule regular health, dental, and eye exams.  Stay current with your vaccines (immunizations).  Do not use any tobacco products, such as cigarettes, chewing tobacco, and e-cigarettes. If you need help quitting, ask your health care provider.  Limit alcohol intake to no more than 2 drinks per day. One drink equals 12 ounces of beer, 5 ounces of wine, or 1 ounces of hard liquor.  Do not use street drugs.  Do not share needles.  Ask your health care provider for help if you need support or information about quitting drugs.  Tell your health care provider if you often feel depressed.  Tell your health  care provider if you have ever been abused or do not feel safe at home. This information is not intended to replace advice given to you by your health care provider. Make sure you discuss any questions you have with your health care provider. Document Released: 11/08/2007 Document Revised: 01/09/2016 Document Reviewed: 02/13/2015 Elsevier Interactive Patient Education  [employer].

## 2017-02-03 NOTE — Progress Notes (Signed)
Subjective:   Travis [name] is a 76 y.o. male who presents for Medicare Annual/Subsequent preventive examination.  The Patient was informed that the wellness visit is to identify future health risk and educate and initiate measures that can reduce risk for increased disease through the lifespan.    Annual Wellness Assessment  Reports health as good   Preventive Screening -Counseling & Management  Medicare Annual Preventive Care Visit - Subsequent Last OV 07/2016 S/p cerebral aneurysm in 9'  Cardioversion 2014 had a breathing problem and goes to the [facility] clinic    VS reviewed;  ETOH; stopped ETOH  Tobacco; Former smoker with quit date 20' AAA guideline reviewed -  The heart does his carotid and check valves to the heart  Colonoscopy 02/2016 - Dr. Ardis Hughs state he did not need one   Diet  Trying to eat less Yogurt or granola bar for breakfast Main meal for lunch  Lean cusine at hs   BMI 27  Exercise trainer 3 times a week  Cardio and weights  and walks at intervals; walks on the treadmill   Dr. Gershon Crane eye exam 09/2016 Hearing Screening Comments: No hearing issues Vision Screening Comments: Vision checked x 1 per year Dr. Gershon Crane  Very early stages of MD        Cardiac Risk Factors Addressed Hyperlipidemia - chol 216; HDL 69; LDL 128; trig 93  Pre-diabetes  BS 117  Advanced Directives  Patient Care Team: [name], MD as PCP - General (Family Medicine) Assessed for additional providers  Immunization History  Administered Date(s) Administered  . Influenza Split 03/04/2011, 02/04/2012  . Influenza Whole 04/17/2004, 03/31/2007, 02/17/2008, 04/12/2009, 03/13/2010  . Influenza, High Dose Seasonal PF 03/17/2016, 02/03/2017  . Influenza,inj,Quad PF,6+ Mos 02/09/2013, 03/06/2014, 01/30/2015  . Pneumococcal Conjugate-13 12/19/2014  . Pneumococcal Polysaccharide-23 04/12/2009  . Td 04/17/2004  . Zoster 09/25/2010   Required Immunizations needed  today  Screening test up to date or reviewed for plan of completion Health Maintenance Due  Topic Date Due  . TETANUS/TDAP  04/17/2014  . INFLUENZA VACCINE  12/24/2016   Educated on the above and shingrix Took the shingrix at the end of April and the end of June  See plan  Patient Care Team: [name], MD as PCP - General (Family Medicine) [name] Cardiology Dr. Gershon Crane ophthalmology Dr Ronnald Ramp Dermatology  Cardiac Risk Factors include: advanced age (>46men, >47 women);dyslipidemia     Objective:    Vitals: BP 134/80   Pulse (!) 55   Ht 5\' 8"  (1.727 m)   Wt 180 lb (81.6 kg)   SpO2 94%   BMI 27.37 kg/m   Body mass index is 27.37 kg/m.  Tobacco History  Smoking Status  . Former Smoker  . Packs/day: 1.50  . Years: 25.00  . Quit date: 05/26/1986  Smokeless Tobacco  . Never Used     Counseling given: Yes   Past Medical History:  Diagnosis Date  . Arthritis   . Bicuspid aortic valve   . Cardiomyopathy -resolved    tachycardia-induced, EF 55% June 2009  . CHF (congestive heart failure) ([place])    JULY 2014  . GERD (gastroesophageal reflux disease)   . History of cardioversion 12/16/2012  . History of stomach ulcers   . Hypertension   . Left bundle branch block   . Paroxysmal atrial fibrillation ([facility])   . Sinus bradycardia   . Status post clamping of cerebral aneurysm 79   Past Surgical History:  Procedure Laterality Date  . CARDIAC CATHETERIZATION    . CARDIOVERSION N/A 12/16/2012   Procedure: CARDIOVERSION;  Surgeon: Lelon Perla, MD;  Location: [facility] ENDOSCOPY;  Service: Cardiovascular;  Laterality: N/A;  . CATARACT EXTRACTION  05/2015   bilateral  . cerebral anuersym post clips    . fractured left arm    . HERNIA REPAIR     lft  . INGUINAL HERNIA REPAIR  07/02/2011   Procedure: LAPAROSCOPIC INGUINAL HERNIA;  Surgeon: Harl Bowie, MD;  Location: [facility];  Service: General;  Laterality: Left;  Laparoscopic left inguinal hernia repair and mesh   . left tendon repair     lft foot  . ROTATOR CUFF REPAIR     lf  . TONSILLECTOMY    . TOTAL KNEE ARTHROPLASTY Bilateral 02/06/2014   Procedure: TOTAL KNEE BILATERAL;  Surgeon: Mauri Pole, MD;  Location: [facility] ORS;  Service: Orthopedics;  Laterality: Bilateral;  . WRIST GANGLION EXCISION     lft   Family History  Problem Relation Age of Onset  . Stroke Father 66       smoker  . Lung cancer Mother 8       former smoker  . Stroke Mother   . Stroke Brother   . Colon cancer Neg Hx    History  Sexual Activity  . Sexual activity: Not Currently    Outpatient Encounter Prescriptions as of 02/03/2017  Medication Sig  . acetaminophen (TYLENOL) 500 MG tablet Take 1,000 mg by mouth every 6 (six) hours as needed for mild pain or moderate pain. Reported on 08/01/2015  . ALPRAZolam (XANAX) 0.5 MG tablet Take 1 tablet (0.5 mg total) by mouth 2 (two) times daily as needed for anxiety (separate by 8 hours from temazepam).  Marland Kitchen amLODipine (NORVASC) 10 MG tablet TAKE 1 TABLET(10 MG) BY MOUTH DAILY  . atorvastatin (LIPITOR) 40 MG tablet Take 1 tablet (40 mg total) by mouth once a week.  Marland Kitchen b complex vitamins tablet Take 1 tablet by mouth daily.   . carvedilol (COREG) 3.125 MG tablet TAKE 1 TABLET(3.125 MG) BY MOUTH TWICE DAILY  . dofetilide (TIKOSYN) 500 MCG capsule TAKE ONE CAPSULE BY MOUTH EVERY 12 HOURS  . finasteride (PROPECIA) 1 MG tablet TAKE 1 TABLET(1 MG) BY MOUTH DAILY  . finasteride (PROPECIA) 1 MG tablet TAKE 1 TABLET(1 MG) BY MOUTH DAILY  . fish oil-omega-3 fatty acids 1000 MG capsule Take 1,000 mg by mouth daily.   . Flaxseed, Linseed, (RA FLAX SEED OIL 1000 PO) Take 1,000 mg by mouth 3 (three) times daily.   . meloxicam (MOBIC) 15 MG tablet TAKE 1 TABLET BY MOUTH DAILY  . Multiple Vitamins-Minerals (ICAPS AREDS 2) CAPS Take by mouth.  Marland Kitchen omeprazole (PRILOSEC) 20 MG capsule TAKE 1 CAPSULE BY MOUTH DAILY  . PRADAXA 150 MG CAPS capsule TAKE 1 CAPSULE(150 MG) BY MOUTH TWICE DAILY  .  telmisartan (MICARDIS) 80 MG tablet TAKE 1 TABLET BY MOUTH EVERY NIGHT AT BEDTIME  . temazepam (RESTORIL) 15 MG capsule TAKE 1 CAPSULE BY MOUTH AT BEDTIME AS NEEDED   No facility-administered encounter medications on file as of 02/03/2017.     Activities of Daily Living In your present state of health, do you have any difficulty performing the following activities: 02/03/2017  Hearing? N  Vision? N  Difficulty concentrating or making decisions? N  Walking or climbing stairs? N  Dressing or bathing? N  Doing errands, shopping? N  Preparing Food and eating ? N  Using  the Toilet? N  In the past six months, have you accidently leaked urine? N  Do you have problems with loss of bowel control? N  Managing your Medications? N  Managing your Finances? N  Housekeeping or managing your Housekeeping? N  Some recent data might be hidden    Patient Care Team: [name], MD as PCP - General (Family Medicine)   Assessment:     Exercise Activities and Dietary recommendations Current Exercise Habits: Structured exercise class, Type of exercise: walking;strength training/weights, Time (Minutes): 45 (walks on his own 2 days 30 minutes ), Frequency (Times/Week): 3, Weekly Exercise (Minutes/Week): 135, Intensity: Moderate  Goals    . patient          Will maintain his health        Fall Risk Fall Risk  02/03/2017 02/01/2016 01/29/2016 12/19/2014 02/09/2013  Falls in the past year? No No No No No  Comment - - Emmi Telephone Survey: data to providers prior to load - -   Depression Screen PHQ 2/9 Scores 02/03/2017 02/01/2016 12/19/2014 02/09/2013  PHQ - 2 Score 0 0 0 0    Cognitive Function   Ad8 score reviewed for issues:  Issues making decisions:  Less interest in hobbies / activities:  Repeats questions, stories (family complaining):  Trouble using ordinary gadgets (microwave, computer, phone):  Forgets the month or year:   Mismanaging finances:   Remembering appts:  Daily  problems with thinking and/or memory: Ad8 score is=0 Also plays duplicate bridge now x 5 years           Immunization History  Administered Date(s) Administered  . Influenza Split 03/04/2011, 02/04/2012  . Influenza Whole 04/17/2004, 03/31/2007, 02/17/2008, 04/12/2009, 03/13/2010  . Influenza, High Dose Seasonal PF 03/17/2016, 02/03/2017  . Influenza,inj,Quad PF,6+ Mos 02/09/2013, 03/06/2014, 01/30/2015  . Pneumococcal Conjugate-13 12/19/2014  . Pneumococcal Polysaccharide-23 04/12/2009  . Td 04/17/2004  . Zoster 09/25/2010   Screening Tests Health Maintenance  Topic Date Due  . TETANUS/TDAP  04/17/2014  . INFLUENZA VACCINE  12/24/2016  . PNA vac Low Risk Adult  Completed      Plan:      PCP Notes  Health Maintenance Take flu vaccine today. States he has taken the shingrix. Was noted in the immunization field. Will take his tdap  at the pharmacy.  Abnormal Screens  None   Referrals  None   Patient concerns; None voiced   Nurse Concerns; As noted   Next PCP apt fup with Dr. Yong Channel in 6 months       I have personally reviewed and noted the following in the patient's chart:   . Medical and social history . Use of alcohol, tobacco or illicit drugs  . Current medications and supplements . Functional ability and status . Nutritional status . Physical activity . Advanced directives . List of other physicians . Hospitalizations, surgeries, and ER visits in previous 12 months . Vitals . Screenings to include cognitive, depression, and falls . Referrals and appointments  In addition, I have reviewed and discussed with patient certain preventive protocols, quality metrics, and best practice recommendations. A written personalized care plan for preventive services as well as general preventive health recommendations were provided to patient.     Wynetta Fines, RN  02/03/2017

## 2017-02-03 NOTE — Progress Notes (Signed)
I have reviewed and agree with note, evaluation, plan.   Chrisha Vogel, MD  

## 2017-02-07 ENCOUNTER — Other Ambulatory Visit: Payer: Self-pay | Admitting: Gastroenterology

## 2017-03-02 DIAGNOSIS — H04123 Dry eye syndrome of bilateral lacrimal glands: Secondary | ICD-10-CM | POA: Diagnosis not present

## 2017-03-14 ENCOUNTER — Other Ambulatory Visit: Payer: Self-pay | Admitting: Internal Medicine

## 2017-03-16 NOTE — Telephone Encounter (Signed)
Refill request for Pradaxa 150mg , pt is 76 yrs old, wt-81.2kg, Crea-0.79 on 07/31/16, last seen by [name] on 09/23/16, CrCl-91.78ml/min. Will send in refill request as requested.

## 2017-03-21 ENCOUNTER — Other Ambulatory Visit: Payer: Self-pay | Admitting: Family Medicine

## 2017-03-24 ENCOUNTER — Encounter (HOSPITAL_COMMUNITY): Payer: Self-pay | Admitting: Nurse Practitioner

## 2017-03-24 ENCOUNTER — Other Ambulatory Visit: Payer: Self-pay | Admitting: Family Medicine

## 2017-03-24 ENCOUNTER — Ambulatory Visit (HOSPITAL_COMMUNITY)
Admission: RE | Admit: 2017-03-24 | Discharge: 2017-03-24 | Disposition: A | Discharge status: Home / Self Care | Payer: Medicare Other | Source: Ambulatory Visit | Attending: Nurse Practitioner | Admitting: Nurse Practitioner

## 2017-03-24 VITALS — BP 144/84 | HR 98 | Ht 68.0 in | Wt 179.0 lb

## 2017-03-24 DIAGNOSIS — Q231 Congenital insufficiency of aortic valve: Secondary | ICD-10-CM | POA: Insufficient documentation

## 2017-03-24 DIAGNOSIS — Z8711 Personal history of peptic ulcer disease: Secondary | ICD-10-CM | POA: Insufficient documentation

## 2017-03-24 DIAGNOSIS — Z96653 Presence of artificial knee joint, bilateral: Secondary | ICD-10-CM | POA: Diagnosis not present

## 2017-03-24 DIAGNOSIS — I110 Hypertensive heart disease with heart failure: Secondary | ICD-10-CM | POA: Insufficient documentation

## 2017-03-24 DIAGNOSIS — I480 Paroxysmal atrial fibrillation: Secondary | ICD-10-CM | POA: Insufficient documentation

## 2017-03-24 DIAGNOSIS — Z823 Family history of stroke: Secondary | ICD-10-CM | POA: Diagnosis not present

## 2017-03-24 DIAGNOSIS — K219 Gastro-esophageal reflux disease without esophagitis: Secondary | ICD-10-CM | POA: Diagnosis not present

## 2017-03-24 DIAGNOSIS — Z801 Family history of malignant neoplasm of trachea, bronchus and lung: Secondary | ICD-10-CM | POA: Insufficient documentation

## 2017-03-24 DIAGNOSIS — Z87891 Personal history of nicotine dependence: Secondary | ICD-10-CM | POA: Insufficient documentation

## 2017-03-24 DIAGNOSIS — Z79899 Other long term (current) drug therapy: Secondary | ICD-10-CM | POA: Insufficient documentation

## 2017-03-24 DIAGNOSIS — I509 Heart failure, unspecified: Secondary | ICD-10-CM | POA: Insufficient documentation

## 2017-03-24 LAB — BASIC METABOLIC PANEL
ANION GAP: 8 (ref 5–15)
BUN: 14 mg/dL (ref 6–20)
CO2: 26 mmol/L (ref 22–32)
Calcium: 8.9 mg/dL (ref 8.9–10.3)
Chloride: 99 mmol/L — ABNORMAL LOW (ref 101–111)
Creatinine, Ser: 0.89 mg/dL (ref 0.61–1.24)
GFR calc Af Amer: >60 mL/min (ref >60)
Glucose, Bld: 115 mg/dL — ABNORMAL HIGH (ref 65–99)
POTASSIUM: 4.5 mmol/L (ref 3.5–5.1)
SODIUM: 133 mmol/L — AB (ref 135–145)

## 2017-03-24 LAB — MAGNESIUM: MAGNESIUM: 2 mg/dL (ref 1.7–2.4)

## 2017-03-24 MED ORDER — RIVAROXABAN 20 MG PO TABS: 20.0000 mg | ORAL_TABLET | Freq: Every day | ORAL | 6 refills | Status: DC

## 2017-03-24 MED ORDER — RIVAROXABAN 20 MG PO TABS: 20.0000 mg | ORAL_TABLET | Freq: Every day | ORAL | 0 refills | Status: DC

## 2017-03-24 NOTE — Patient Instructions (Signed)
Your physician has recommended you make the following change in your medication:  1)When you stop Pradaxa take morning dose then start Xarelto 20mg  once a day with supper.

## 2017-03-24 NOTE — Progress Notes (Signed)
Patient ID: Travis [name], male   DOB: 07-24-1940, 76 y.o.   MRN: [id number]     Primary Care Physician: [name], MD Referring Physician: Dr. Nance Pear is a 76 y.o. male with a h/o afib on tikosyn. He reports doing well over the last few months without any evidence of afib. He is compliant with tikosyn and with pradaxa. He is in afib today but is unaware. Reports that it is not unusual for him to have breakthrough afib from time to time. He is wanting to change over to xarelto for insurance/cost reasons.   Today, he denies symptoms of palpitations, chest pain, shortness of breath, orthopnea, PND, lower extremity edema, dizziness, presyncope, syncope, or neurologic sequela. The patient is tolerating medications without difficulties and is otherwise without complaint today.   Past Medical History:  Diagnosis Date  . Arthritis   . Bicuspid aortic valve   . Cardiomyopathy -resolved    tachycardia-induced, EF 55% June 2009  . CHF (congestive heart failure) ([place])    JULY 2014  . GERD (gastroesophageal reflux disease)   . History of cardioversion 12/16/2012  . History of stomach ulcers   . Hypertension   . Left bundle branch block   . Paroxysmal atrial fibrillation (HCC)   . Sinus bradycardia   . Status post clamping of cerebral aneurysm 80   Past Surgical History:  Procedure Laterality Date  . CARDIAC CATHETERIZATION    . CARDIOVERSION N/A 12/16/2012   Procedure: CARDIOVERSION;  Surgeon: Lelon Perla, MD;  Location: [facility] ENDOSCOPY;  Service: Cardiovascular;  Laterality: N/A;  . CATARACT EXTRACTION  05/2015   bilateral  . cerebral anuersym post clips    . fractured left arm    . HERNIA REPAIR     lft  . INGUINAL HERNIA REPAIR  07/02/2011   Procedure: LAPAROSCOPIC INGUINAL HERNIA;  Surgeon: Harl Bowie, MD;  Location: [facility];  Service: General;  Laterality: Left;  Laparoscopic left inguinal hernia repair and mesh  . left tendon repair     lft foot  .  ROTATOR CUFF REPAIR     lf  . TONSILLECTOMY    . TOTAL KNEE ARTHROPLASTY Bilateral 02/06/2014   Procedure: TOTAL KNEE BILATERAL;  Surgeon: Mauri Pole, MD;  Location: [facility] ORS;  Service: Orthopedics;  Laterality: Bilateral;  . WRIST GANGLION EXCISION     lft    Current Outpatient Prescriptions  Medication Sig Dispense Refill  . acetaminophen (TYLENOL) 500 MG tablet Take 1,000 mg by mouth every 6 (six) hours as needed for mild pain or moderate pain. Reported on 08/01/2015    . ALPRAZolam (XANAX) 0.5 MG tablet Take 1 tablet (0.5 mg total) by mouth 2 (two) times daily as needed for anxiety (separate by 8 hours from temazepam). 5 tablet 0  . amLODipine (NORVASC) 10 MG tablet TAKE 1 TABLET(10 MG) BY MOUTH DAILY 90 tablet 1  . atorvastatin (LIPITOR) 40 MG tablet Take 1 tablet (40 mg total) by mouth once a week. 14 tablet 3  . b complex vitamins tablet Take 1 tablet by mouth daily.     . carvedilol (COREG) 3.125 MG tablet TAKE 1 TABLET(3.125 MG) BY MOUTH TWICE DAILY 180 tablet 2  . dofetilide (TIKOSYN) 500 MCG capsule TAKE ONE CAPSULE BY MOUTH EVERY 12 HOURS 180 capsule 3  . finasteride (PROPECIA) 1 MG tablet TAKE 1 TABLET(1 MG) BY MOUTH DAILY 30 tablet 5  . fish oil-omega-3 fatty acids 1000 MG capsule Take 1,000  mg by mouth daily.     . Flaxseed, Linseed, (RA FLAX SEED OIL 1000 PO) Take 1,000 mg by mouth 3 (three) times daily.     . meloxicam (MOBIC) 15 MG tablet TAKE 1 TABLET BY MOUTH DAILY 90 tablet 0  . Multiple Vitamins-Minerals (ICAPS AREDS 2) CAPS Take by mouth.    Marland Kitchen omeprazole (PRILOSEC) 20 MG capsule TAKE 1 CAPSULE BY MOUTH DAILY 90 capsule 0  . telmisartan (MICARDIS) 80 MG tablet TAKE 1 TABLET BY MOUTH EVERY NIGHT AT BEDTIME 30 tablet 5  . temazepam (RESTORIL) 15 MG capsule TAKE 1 CAPSULE BY MOUTH AT BEDTIME AS NEEDED 30 capsule 5  . rivaroxaban (XARELTO) 20 MG TABS tablet Take 1 tablet (20 mg total) by mouth daily with supper. 30 tablet 6  . rivaroxaban (XARELTO) 20 MG TABS tablet Take 1  tablet (20 mg total) by mouth daily with supper. 30 tablet 0   No current facility-administered medications for this encounter.     No Known Allergies  Social History   Social History  . Marital status: Single    Spouse name: N/A  . Number of children: 0  . Years of education: N/A   Occupational History  . Retired    Social History Main Topics  . Smoking status: Former Smoker    Packs/day: 1.50    Years: 25.00    Quit date: 05/26/1986  . Smokeless tobacco: Never Used  . Alcohol use 0.5 oz/week    1 Standard drinks or equivalent per week     Comment: stopped drinking   . Drug use: No  . Sexual activity: Not Currently   Other Topics Concern  . Not on file   Social History Narrative   Homosexual. Lives alone. Not sexually active.       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 Stroke Father 50       smoker  . Lung cancer Mother 23       former smoker  . Stroke Mother   . Stroke Brother   . Colon cancer Neg Hx     ROS- All systems are reviewed and negative except as per the HPI above  Physical Exam: Vitals:   03/24/17 1032  BP: (!) 144/84  Pulse: 98  Weight: 179 lb (81.2 kg)  Height: 5\' 8"  (1.727 m)    GEN- The patient is well appearing, alert and oriented x 3 today.   Head- normocephalic, atraumatic Eyes-  Sclera clear, conjunctiva pink Ears- hearing intact Oropharynx- clear Neck- supple, no JVP Lymph- no cervical lymphadenopathy Lungs- Clear to ausculation bilaterally, normal work of breathing Heart- Regular rate and rhythm, no murmurs, rubs or gallops, PMI not laterally displaced GI- soft, NT, ND, + BS Extremities- no clubbing, cyanosis, or edema MS- no significant deformity or atrophy Skin- no rash or lesion Psych- euthymic mood, full affect Neuro- strength and sensation are intact  EKG- afib at 98  bpm, qrs int 134 ms, qtc 423 ms Epic records reviewed  Assessment and Plan: 1. Afib For most part staying in SR with Tikosyn In afib today but unaware, usually episodes are short lived Continue 500 mg bid with stable QT Continue pradaxa, but wants to change over to xarelto for cost reason Free 30 days of xarelto 20 mg a day(crcl cal at 90) given with  instructions how to switch over form Pradaxa, reminded just 1x a day with dinner meal  Bmet/mag today  Scheduled to see [name] in 6 months afib clinic as needed   Travis [name], [facility] [address] [place], [place] [address] [phone]

## 2017-04-11 ENCOUNTER — Other Ambulatory Visit: Payer: Self-pay | Admitting: Family Medicine

## 2017-04-18 ENCOUNTER — Other Ambulatory Visit: Payer: Self-pay | Admitting: Family Medicine

## 2017-05-06 ENCOUNTER — Other Ambulatory Visit: Payer: Self-pay | Admitting: Gastroenterology

## 2017-05-29 ENCOUNTER — Other Ambulatory Visit: Payer: Self-pay | Admitting: Family Medicine

## 2017-06-28 ENCOUNTER — Other Ambulatory Visit: Payer: Self-pay | Admitting: Family Medicine

## 2017-06-30 ENCOUNTER — Other Ambulatory Visit: Payer: Self-pay | Admitting: Family Medicine

## 2017-07-10 ENCOUNTER — Other Ambulatory Visit: Payer: Self-pay | Admitting: Family Medicine

## 2017-07-14 DIAGNOSIS — D2261 Melanocytic nevi of right upper limb, including shoulder: Secondary | ICD-10-CM | POA: Diagnosis not present

## 2017-07-14 DIAGNOSIS — D2262 Melanocytic nevi of left upper limb, including shoulder: Secondary | ICD-10-CM | POA: Diagnosis not present

## 2017-07-14 DIAGNOSIS — L570 Actinic keratosis: Secondary | ICD-10-CM | POA: Diagnosis not present

## 2017-07-14 DIAGNOSIS — D225 Melanocytic nevi of trunk: Secondary | ICD-10-CM | POA: Diagnosis not present

## 2017-07-14 DIAGNOSIS — L821 Other seborrheic keratosis: Secondary | ICD-10-CM | POA: Diagnosis not present

## 2017-07-25 ENCOUNTER — Other Ambulatory Visit: Payer: Self-pay | Admitting: Family Medicine

## 2017-08-03 ENCOUNTER — Other Ambulatory Visit: Payer: Self-pay | Admitting: Gastroenterology

## 2017-08-03 ENCOUNTER — Other Ambulatory Visit: Payer: Self-pay | Admitting: Family Medicine

## 2017-08-03 NOTE — Telephone Encounter (Signed)
See note

## 2017-08-03 NOTE — Telephone Encounter (Signed)
Last OV: 07/31/16 PCP: [place]: [facility] [place], [place] [phone] (Phone) [phone] (Fax)

## 2017-08-03 NOTE — Telephone Encounter (Signed)
Copied from [place] [id number]. Topic: Quick Communication - Rx Refill/Question >> Aug 03, 2017 11:05 AM Neva Seat wrote: Alprazolam .5 mg tab  Pt needing refill  [facility] [place], [place] [address] Phone: [phone] Fax: [phone]

## 2017-08-04 ENCOUNTER — Ambulatory Visit: Payer: Medicare Other | Admitting: Family Medicine

## 2017-08-04 MED ORDER — ALPRAZOLAM 0.5 MG PO TABS: 0.5000 mg | ORAL_TABLET | Freq: Two times a day (BID) | ORAL | 0 refills | Status: DC | PRN

## 2017-08-10 ENCOUNTER — Telehealth: Payer: Self-pay | Admitting: Gastroenterology

## 2017-08-10 DIAGNOSIS — R110 Nausea: Secondary | ICD-10-CM | POA: Diagnosis not present

## 2017-08-10 DIAGNOSIS — R197 Diarrhea, unspecified: Secondary | ICD-10-CM | POA: Diagnosis not present

## 2017-08-10 NOTE — Telephone Encounter (Signed)
The pt began having some nausea about 5 days ago and wanted something to help.  The pt was advised that he needs to be seen.  An appt was offered with Ellouise Newer in 3/21. The pt will call if he decides to see PCP prior to the appt.

## 2017-08-10 NOTE — Telephone Encounter (Signed)
Patient states he has been experiencing low grade nausea for about a week and wants to know if Dr.Jacobs could suggest something to help him.

## 2017-08-12 ENCOUNTER — Other Ambulatory Visit: Payer: Self-pay

## 2017-08-12 ENCOUNTER — Emergency Department (HOSPITAL_COMMUNITY): Payer: Medicare Other

## 2017-08-12 ENCOUNTER — Inpatient Hospital Stay (HOSPITAL_COMMUNITY)
Admission: EM | Admit: 2017-08-12 | Discharge: 2017-08-16 | DRG: 308 | Disposition: A | Discharge status: Home / Self Care | Payer: Medicare Other | Attending: Internal Medicine | Admitting: Internal Medicine

## 2017-08-12 ENCOUNTER — Encounter (HOSPITAL_COMMUNITY): Payer: Self-pay | Admitting: Emergency Medicine

## 2017-08-12 DIAGNOSIS — R000 Tachycardia, unspecified: Secondary | ICD-10-CM | POA: Diagnosis not present

## 2017-08-12 DIAGNOSIS — E871 Hypo-osmolality and hyponatremia: Secondary | ICD-10-CM | POA: Diagnosis not present

## 2017-08-12 DIAGNOSIS — M1990 Unspecified osteoarthritis, unspecified site: Secondary | ICD-10-CM | POA: Diagnosis present

## 2017-08-12 DIAGNOSIS — I5031 Acute diastolic (congestive) heart failure: Secondary | ICD-10-CM

## 2017-08-12 DIAGNOSIS — Z87891 Personal history of nicotine dependence: Secondary | ICD-10-CM

## 2017-08-12 DIAGNOSIS — Z791 Long term (current) use of non-steroidal anti-inflammatories (NSAID): Secondary | ICD-10-CM | POA: Diagnosis not present

## 2017-08-12 DIAGNOSIS — I110 Hypertensive heart disease with heart failure: Secondary | ICD-10-CM | POA: Diagnosis present

## 2017-08-12 DIAGNOSIS — I080 Rheumatic disorders of both mitral and aortic valves: Secondary | ICD-10-CM | POA: Diagnosis present

## 2017-08-12 DIAGNOSIS — I272 Pulmonary hypertension, unspecified: Secondary | ICD-10-CM | POA: Diagnosis present

## 2017-08-12 DIAGNOSIS — I517 Cardiomegaly: Secondary | ICD-10-CM | POA: Diagnosis present

## 2017-08-12 DIAGNOSIS — I480 Paroxysmal atrial fibrillation: Secondary | ICD-10-CM | POA: Diagnosis present

## 2017-08-12 DIAGNOSIS — I351 Nonrheumatic aortic (valve) insufficiency: Secondary | ICD-10-CM | POA: Diagnosis not present

## 2017-08-12 DIAGNOSIS — Z79899 Other long term (current) drug therapy: Secondary | ICD-10-CM

## 2017-08-12 DIAGNOSIS — J189 Pneumonia, unspecified organism: Secondary | ICD-10-CM

## 2017-08-12 DIAGNOSIS — I5041 Acute combined systolic (congestive) and diastolic (congestive) heart failure: Secondary | ICD-10-CM | POA: Diagnosis present

## 2017-08-12 DIAGNOSIS — K219 Gastro-esophageal reflux disease without esophagitis: Secondary | ICD-10-CM | POA: Diagnosis present

## 2017-08-12 DIAGNOSIS — I4891 Unspecified atrial fibrillation: Secondary | ICD-10-CM | POA: Diagnosis present

## 2017-08-12 DIAGNOSIS — Z9841 Cataract extraction status, right eye: Secondary | ICD-10-CM

## 2017-08-12 DIAGNOSIS — Z96653 Presence of artificial knee joint, bilateral: Secondary | ICD-10-CM | POA: Diagnosis present

## 2017-08-12 DIAGNOSIS — Q2381 Bicuspid aortic valve: Secondary | ICD-10-CM

## 2017-08-12 DIAGNOSIS — R069 Unspecified abnormalities of breathing: Secondary | ICD-10-CM | POA: Diagnosis not present

## 2017-08-12 DIAGNOSIS — R0602 Shortness of breath: Secondary | ICD-10-CM | POA: Diagnosis not present

## 2017-08-12 DIAGNOSIS — I481 Persistent atrial fibrillation: Principal | ICD-10-CM | POA: Diagnosis present

## 2017-08-12 DIAGNOSIS — I10 Essential (primary) hypertension: Secondary | ICD-10-CM

## 2017-08-12 DIAGNOSIS — Q231 Congenital insufficiency of aortic valve: Secondary | ICD-10-CM | POA: Diagnosis not present

## 2017-08-12 DIAGNOSIS — Z9842 Cataract extraction status, left eye: Secondary | ICD-10-CM

## 2017-08-12 DIAGNOSIS — I5043 Acute on chronic combined systolic (congestive) and diastolic (congestive) heart failure: Secondary | ICD-10-CM

## 2017-08-12 DIAGNOSIS — J181 Lobar pneumonia, unspecified organism: Secondary | ICD-10-CM

## 2017-08-12 DIAGNOSIS — Z801 Family history of malignant neoplasm of trachea, bronchus and lung: Secondary | ICD-10-CM | POA: Diagnosis not present

## 2017-08-12 DIAGNOSIS — I5033 Acute on chronic diastolic (congestive) heart failure: Secondary | ICD-10-CM | POA: Diagnosis not present

## 2017-08-12 DIAGNOSIS — E785 Hyperlipidemia, unspecified: Secondary | ICD-10-CM | POA: Diagnosis present

## 2017-08-12 DIAGNOSIS — J810 Acute pulmonary edema: Secondary | ICD-10-CM | POA: Diagnosis not present

## 2017-08-12 DIAGNOSIS — J9601 Acute respiratory failure with hypoxia: Secondary | ICD-10-CM | POA: Diagnosis present

## 2017-08-12 DIAGNOSIS — E876 Hypokalemia: Secondary | ICD-10-CM | POA: Diagnosis not present

## 2017-08-12 DIAGNOSIS — I447 Left bundle-branch block, unspecified: Secondary | ICD-10-CM | POA: Diagnosis present

## 2017-08-12 LAB — I-STAT CHEM 8, ED
BUN: 23 mg/dL — ABNORMAL HIGH (ref 6–20)
CHLORIDE: 95 mmol/L — AB (ref 101–111)
Calcium, Ion: 1.04 mmol/L — ABNORMAL LOW (ref 1.15–1.40)
Creatinine, Ser: 1 mg/dL (ref 0.61–1.24)
GLUCOSE: 146 mg/dL — AB (ref 65–99)
HCT: 46 % (ref 39.0–52.0)
HEMOGLOBIN: 15.6 g/dL (ref 13.0–17.0)
Potassium: 4.6 mmol/L (ref 3.5–5.1)
SODIUM: 128 mmol/L — AB (ref 135–145)
TCO2: 20 mmol/L — AB (ref 22–32)

## 2017-08-12 LAB — CBC WITH DIFFERENTIAL/PLATELET
BASOS ABS: 0 10*3/uL (ref 0.0–0.1)
BASOS PCT: 0 %
EOS ABS: 0.2 10*3/uL (ref 0.0–0.7)
Eosinophils Relative: 2 %
HEMATOCRIT: 42.5 % (ref 39.0–52.0)
Hemoglobin: 15.1 g/dL (ref 13.0–17.0)
Lymphocytes Relative: 16 %
Lymphs Abs: 1.7 10*3/uL (ref 0.7–4.0)
MCH: 35.4 pg — ABNORMAL HIGH (ref 26.0–34.0)
MCHC: 35.5 g/dL (ref 30.0–36.0)
MCV: 99.5 fL (ref 78.0–100.0)
MONO ABS: 0.7 10*3/uL (ref 0.1–1.0)
Monocytes Relative: 7 %
Neutro Abs: 7.8 10*3/uL — ABNORMAL HIGH (ref 1.7–7.7)
Neutrophils Relative %: 75 %
PLATELETS: 189 10*3/uL (ref 150–400)
RBC: 4.27 MIL/uL (ref 4.22–5.81)
RDW: 13.4 % (ref 11.5–15.5)
WBC: 10.3 10*3/uL (ref 4.0–10.5)

## 2017-08-12 LAB — BRAIN NATRIURETIC PEPTIDE: B NATRIURETIC PEPTIDE 5: 649.3 pg/mL — AB (ref 0.0–100.0)

## 2017-08-12 LAB — BASIC METABOLIC PANEL
Anion gap: 11 (ref 5–15)
BUN: 20 mg/dL (ref 6–20)
CALCIUM: 8.5 mg/dL — AB (ref 8.9–10.3)
CO2: 20 mmol/L — ABNORMAL LOW (ref 22–32)
Chloride: 96 mmol/L — ABNORMAL LOW (ref 101–111)
Creatinine, Ser: 1.04 mg/dL (ref 0.61–1.24)
Glucose, Bld: 146 mg/dL — ABNORMAL HIGH (ref 65–99)
Potassium: 4.6 mmol/L (ref 3.5–5.1)
Sodium: 127 mmol/L — ABNORMAL LOW (ref 135–145)

## 2017-08-12 LAB — I-STAT TROPONIN, ED: TROPONIN I, POC: 0.01 ng/mL (ref 0.00–0.08)

## 2017-08-12 LAB — MAGNESIUM: MAGNESIUM: 1.8 mg/dL (ref 1.7–2.4)

## 2017-08-12 LAB — TSH: TSH: 2.12 u[IU]/mL (ref 0.350–4.500)

## 2017-08-12 LAB — PROCALCITONIN

## 2017-08-12 MED ORDER — SODIUM CHLORIDE 0.9% FLUSH
3.0000 mL | Freq: Two times a day (BID) | INTRAVENOUS | Status: DC
  Administered 2017-08-13 – 2017-08-16 (×8): 3 mL via INTRAVENOUS

## 2017-08-12 MED ORDER — SODIUM CHLORIDE 0.9 % IV BOLUS (SEPSIS): 1000.0000 mL | Freq: Once | INTRAVENOUS | Status: DC

## 2017-08-12 MED ORDER — ACETAMINOPHEN 325 MG PO TABS: 650.0000 mg | ORAL_TABLET | ORAL | Status: DC | PRN

## 2017-08-12 MED ORDER — NITROGLYCERIN IN D5W 200-5 MCG/ML-% IV SOLN: 0.0000 ug/min | Freq: Once | INTRAVENOUS | Status: DC

## 2017-08-12 MED ORDER — SODIUM CHLORIDE 0.9 % IV SOLN
500.0000 mg | Freq: Once | INTRAVENOUS | Status: AC
  Administered 2017-08-12: 500 mg via INTRAVENOUS
  Filled 2017-08-12: qty 500

## 2017-08-12 MED ORDER — ATORVASTATIN CALCIUM 40 MG PO TABS
40.0000 mg | ORAL_TABLET | ORAL | Status: DC
  Administered 2017-08-16: 40 mg via ORAL
  Filled 2017-08-12: qty 1

## 2017-08-12 MED ORDER — TEMAZEPAM 15 MG PO CAPS
15.0000 mg | ORAL_CAPSULE | Freq: Every evening | ORAL | Status: DC | PRN
  Administered 2017-08-13 – 2017-08-15 (×3): 15 mg via ORAL
  Filled 2017-08-12 (×3): qty 1

## 2017-08-12 MED ORDER — FINASTERIDE 1 MG PO TABS: 1.0000 mg | ORAL_TABLET | Freq: Every day | ORAL | Status: DC

## 2017-08-12 MED ORDER — ONDANSETRON HCL 4 MG/2ML IJ SOLN: 4.0000 mg | Freq: Four times a day (QID) | INTRAMUSCULAR | Status: DC | PRN

## 2017-08-12 MED ORDER — SODIUM CHLORIDE 0.9 % IV SOLN
250.0000 mL | INTRAVENOUS | Status: DC | PRN
  Administered 2017-08-14: 12:00:00 via INTRAVENOUS

## 2017-08-12 MED ORDER — NITROGLYCERIN 0.4 MG SL SUBL: 0.4000 mg | SUBLINGUAL_TABLET | SUBLINGUAL | Status: DC | PRN

## 2017-08-12 MED ORDER — FUROSEMIDE 10 MG/ML IJ SOLN
40.0000 mg | Freq: Two times a day (BID) | INTRAMUSCULAR | Status: DC
  Administered 2017-08-13: 40 mg via INTRAVENOUS
  Filled 2017-08-12: qty 4

## 2017-08-12 MED ORDER — DOFETILIDE 500 MCG PO CAPS
500.0000 ug | ORAL_CAPSULE | Freq: Two times a day (BID) | ORAL | Status: DC
  Administered 2017-08-13 – 2017-08-16 (×8): 500 ug via ORAL
  Filled 2017-08-12 (×10): qty 1

## 2017-08-12 MED ORDER — IPRATROPIUM-ALBUTEROL 0.5-2.5 (3) MG/3ML IN SOLN
3.0000 mL | Freq: Once | RESPIRATORY_TRACT | Status: AC
  Administered 2017-08-12: 3 mL via RESPIRATORY_TRACT
  Filled 2017-08-12: qty 3

## 2017-08-12 MED ORDER — SODIUM CHLORIDE 0.9% FLUSH: 3.0000 mL | INTRAVENOUS | Status: DC | PRN

## 2017-08-12 MED ORDER — PANTOPRAZOLE SODIUM 40 MG PO TBEC
40.0000 mg | DELAYED_RELEASE_TABLET | Freq: Every day | ORAL | Status: DC
Start: 2017-08-12 — End: 2017-08-16
  Administered 2017-08-13 – 2017-08-16 (×5): 40 mg via ORAL
  Filled 2017-08-12 (×5): qty 1

## 2017-08-12 MED ORDER — FUROSEMIDE 10 MG/ML IJ SOLN
20.0000 mg | Freq: Once | INTRAMUSCULAR | Status: AC
  Administered 2017-08-12: 20 mg via INTRAVENOUS
  Filled 2017-08-12: qty 2

## 2017-08-12 MED ORDER — OMEGA-3-ACID ETHYL ESTERS 1 G PO CAPS
1000.0000 mg | ORAL_CAPSULE | Freq: Every day | ORAL | Status: DC
  Administered 2017-08-13 – 2017-08-16 (×4): 1000 mg via ORAL
  Filled 2017-08-12 (×5): qty 1

## 2017-08-12 MED ORDER — RIVAROXABAN 20 MG PO TABS
20.0000 mg | ORAL_TABLET | Freq: Every day | ORAL | Status: DC
  Administered 2017-08-13 – 2017-08-15 (×3): 20 mg via ORAL
  Filled 2017-08-12 (×4): qty 1

## 2017-08-12 MED ORDER — SODIUM CHLORIDE 0.9 % IV SOLN
1.0000 g | Freq: Once | INTRAVENOUS | Status: AC
  Administered 2017-08-12: 1 g via INTRAVENOUS
  Filled 2017-08-12: qty 10

## 2017-08-12 MED ORDER — NITROGLYCERIN 2 % TD OINT
1.0000 [in_us] | TOPICAL_OINTMENT | Freq: Once | TRANSDERMAL | Status: AC
  Administered 2017-08-12: 1 [in_us] via TOPICAL
  Filled 2017-08-12: qty 1

## 2017-08-12 MED ORDER — ALPRAZOLAM 0.5 MG PO TABS: 0.5000 mg | ORAL_TABLET | Freq: Two times a day (BID) | ORAL | Status: DC | PRN

## 2017-08-12 NOTE — H&P (Signed)
History and Physical    Travis [name] JAS:[id number] DOB: 11-May-1941 DOA: 08/12/2017  **Will admit patient based on the expectation that the patient will need hospitalization/ hospital care that crosses at least 2 midnights  PCP: [name], MD   Attending physician: Lonny Prude  Patient coming from/Resides with: Private residence  Chief Complaint: Shortness of breath and palpitations  HPI: Travis [name] is a 77 y.o. male with medical history significant for reflux, atrial fibrillation on Tikosyn previously maintaining sinus rhythm, bicuspid aortic valve with associated chronic diastolic heart failure remote tobacco abuse.  Patient reports that for 1 week he has had cough and congestion with the cough being nonproductive.  This is not associated with fevers or other constitutional symptoms.  He reports for the past "several days" he has known that he is in atrial fibrillation stating he has been having frequent palpitations.  Presented to the ER by EMS noted to be in respiratory distress with increased work of breathing, congested cough and pale and clammy.  Upon EMS arrival to the home patient's pulse oximetry was 78% and he was placed on CPAP.  He was given 5 mg of albuterol.  He was otherwise hemodynamically stable except for tachycardia with underlying atrial fibrillation.  Chest x-ray in the ER revealed cardiomegaly with pulmonary vascular congestion and interstitial edema with small bilateral pleural effusions.  Patchy consolidation in the left lower lobe.  He was placed on BiPAP.  Patient was afebrile.  He did not have any leukocytosis.  BNP was elevated at 649.  He has been given 20 mg of Lasix in the ER.  He continues to have episodic RVR with ventricular rates increasing into the 130s with minimal activity such as leaning forward in the bed.  He remains in atrial fibrillation and continues to have frequent unifocal PVCs.  Potassium is 4.6.  ED Course:  Vital Signs: BP 116/90    Pulse 95   Temp 98.1 F (36.7 C) (Axillary)   Resp (!) 22   SpO2 96%  Chest x-ray: As above Lab data:, Potassium 4.6, chloride 96, CO2 20, glucose 146, BUN 20, creatinine 1.04, calcium 8.5, anion gap 11, BNP 649, poc troponin 0 0.01, white count 10,300 with normal differential, hemoglobin 15, platelets 189,000  Medications and treatments: DuoNeb x1, nitroglycerin ointment 2% 1 inch x1, Rocephin 1 g IV x1, Zithromax 500 mg IV x1, Lasix 20 mg IV x1  Review of Systems:  In addition to the HPI above,  No Fever-chills, myalgias or other constitutional symptoms No Headache, changes with Vision or hearing, new weakness, tingling, numbness in any extremity, dizziness, dysarthria or word finding difficulty, gait disturbance or imbalance, tremors or seizure activity No problems swallowing food or Liquids, indigestion/reflux, choking or coughing while eating, abdominal pain with or after eating No Chest pain No Abdominal pain, N/V, melena,hematochezia, dark tarry stools, constipation No dysuria, malodorous urine, hematuria or flank pain No new skin rashes, lesions, masses or bruises, No new joint pains, aches, swelling or redness No recent unintentional weight gain or loss No polyuria, polydypsia or polyphagia   Past Medical History:  Diagnosis Date  . Arthritis   . Bicuspid aortic valve   . Cardiomyopathy -resolved    tachycardia-induced, EF 55% June 2009  . CHF (congestive heart failure) ([place])    JULY 2014  . GERD (gastroesophageal reflux disease)   . History of cardioversion 12/16/2012  . History of stomach ulcers   . Hypertension   . Left bundle branch  block   . Paroxysmal atrial fibrillation (HCC)   . Sinus bradycardia   . Status post clamping of cerebral aneurysm 80    Past Surgical History:  Procedure Laterality Date  . CARDIAC CATHETERIZATION    . CARDIOVERSION N/A 12/16/2012   Procedure: CARDIOVERSION;  Surgeon: Lelon Perla, MD;  Location: [facility] ENDOSCOPY;  Service:  Cardiovascular;  Laterality: N/A;  . CATARACT EXTRACTION  05/2015   bilateral  . cerebral anuersym post clips    . fractured left arm    . HERNIA REPAIR     lft  . INGUINAL HERNIA REPAIR  07/02/2011   Procedure: LAPAROSCOPIC INGUINAL HERNIA;  Surgeon: Harl Bowie, MD;  Location: [facility];  Service: General;  Laterality: Left;  Laparoscopic left inguinal hernia repair and mesh  . left tendon repair     lft foot  . ROTATOR CUFF REPAIR     lf  . TONSILLECTOMY    . TOTAL KNEE ARTHROPLASTY Bilateral 02/06/2014   Procedure: TOTAL KNEE BILATERAL;  Surgeon: Mauri Pole, MD;  Location: [facility];  Service: Orthopedics;  Laterality: Bilateral;  . WRIST GANGLION EXCISION     lft    Social History   Socioeconomic History  . Marital status: Single    Spouse name: Not on file  . Number of children: 0  . Years of education: Not on file  . Highest education level: Not on file  Social Needs  . Financial resource strain: Not on file  . Food insecurity - worry: Not on file  . Food insecurity - inability: Not on file  . Transportation needs - medical: Not on file  . Transportation needs - non-medical: Not on file  Occupational History  . Occupation: Retired  Tobacco Use  . Smoking status: Former Smoker    Packs/day: 1.50    Years: 25.00    Pack years: 37.50    Last attempt to quit: 05/26/1986    Years since quitting: 31.2  . Smokeless tobacco: Never Used  Substance and Sexual Activity  . Alcohol use: Yes    Alcohol/week: 0.5 oz    Types: 1 Standard drinks or equivalent per week    Comment: stopped drinking   . Drug use: No  . Sexual activity: Not Currently  Other Topics Concern  . Not on file  Social History Narrative   Homosexual. Lives alone. Not sexually active.       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 Stroke Father 15       smoker  . Lung cancer Mother 28       former smoker  . Stroke Mother   . Stroke Brother   . Colon cancer Neg Hx      Prior to Admission medications   Medication Sig Start Date End Date Taking? Authorizing Provider  acetaminophen (TYLENOL) 500 MG tablet Take 1,000 mg by mouth every 6 (six) hours as needed for mild pain or moderate pain. Reported on 08/01/2015    [provider]  ALPRAZolam Duanne Moron) 0.5 MG tablet Take 1 tablet (0.5 mg total) by mouth 2 (two) times daily as needed for anxiety (separate by 8 hours from temazepam). 08/04/17   [name], MD  amLODipine (NORVASC) 10 MG tablet TAKE  1 TABLET(10 MG) BY MOUTH DAILY 05/29/17   [name], MD  amLODipine (NORVASC) 10 MG tablet TAKE 1 TABLET(10 MG) BY MOUTH DAILY 06/30/17   [name], MD  atorvastatin (LIPITOR) 40 MG tablet Take 1 tablet (40 mg total) by mouth once a week. 07/31/16   [name], MD  atorvastatin (LIPITOR) 40 MG tablet TAKE 1 TABLET(40 MG) BY MOUTH 1 TIME A WEEK 06/29/17   [name], MD  b complex vitamins tablet Take 1 tablet by mouth daily.     [provider]  carvedilol (COREG) 3.125 MG tablet TAKE 1 TABLET(3.125 MG) BY MOUTH TWICE DAILY 11/24/16   Deboraha Sprang, MD  dofetilide (TIKOSYN) 500 MCG capsule TAKE ONE CAPSULE BY MOUTH EVERY 12 HOURS 10/27/16   Deboraha Sprang, MD  finasteride (PROPECIA) 1 MG tablet TAKE 1 TABLET(1 MG) BY MOUTH DAILY 03/21/15   [name], MD  finasteride (PROPECIA) 1 MG tablet TAKE 1 TABLET(1 MG) BY MOUTH DAILY 04/20/17   [name], MD  fish oil-omega-3 fatty acids 1000 MG capsule Take 1,000 mg by mouth daily.     [provider]  Flaxseed, Linseed, (RA FLAX SEED OIL 1000 PO) Take 1,000 mg by mouth 3 (three) times daily.     [provider]  meloxicam (MOBIC) 15 MG tablet TAKE 1 TABLET BY MOUTH DAILY 07/10/17   [name], MD   Multiple Vitamins-Minerals (ICAPS AREDS 2) CAPS Take by mouth.    [provider]  omeprazole (PRILOSEC) 20 MG capsule TAKE 1 CAPSULE BY MOUTH DAILY 08/03/17   Milus Banister, MD  rivaroxaban (XARELTO) 20 MG TABS tablet Take 1 tablet (20 mg total) by mouth daily with supper. 03/24/17   Sherran Needs, NP  rivaroxaban (XARELTO) 20 MG TABS tablet Take 1 tablet (20 mg total) by mouth daily with supper. 03/24/17   Sherran Needs, NP  telmisartan (MICARDIS) 80 MG tablet TAKE 1 TABLET BY MOUTH EVERY NIGHT AT BEDTIME 03/24/17   [name], MD  temazepam (RESTORIL) 15 MG capsule TAKE ONE CAPSULE BY MOUTH AT BEDTIME AS NEEDED 07/27/17   [name], MD    Physical Exam: Vitals:   08/12/17 1615 08/12/17 1645 08/12/17 1715 08/12/17 1800  BP: 111/81 (!) 124/91 116/90   Pulse: (!) 110 69 67 95  Resp:      Temp:      TempSrc:      SpO2: 94% 97% 95% 96%      Constitutional: NAD, calm, comfortable Eyes: PERRL, lids and conjunctivae normal ENMT: Unable to adequately assess mucous membranes and posterior pharynx in context of BiPAP mask in place Neck: normal, supple, no masses, no thyromegaly Respiratory: Coarse bilateral crackles auscultated on posterior exam. Normal respiratory effort without accessory muscle use at rest.  Pap Cardiovascular: Irregular rate underlying atrial fibrillation rhythm recurrent RVR, no murmurs / rubs / gallops. No extremity edema. 2+ pedal pulses. No carotid bruits.  Abdomen: no tenderness, no masses palpated. No hepatosplenomegaly. Bowel sounds positive.  Musculoskeletal: no clubbing / cyanosis. No joint deformity upper and lower extremities. Good ROM, no contractures. Normal muscle tone.  Skin: no rashes, lesions, ulcers. No induration Neurologic: CN 2-12 grossly intact. Sensation intact, DTR normal. Strength 5/5 x all 4 extremities.  Psychiatric: Normal judgment and insight. Alert and oriented x 3. Normal mood.    Labs on Admission: I have  personally reviewed following labs and imaging studies  CBC: Recent Labs  Lab 08/12/17 1528 08/12/17 1612  WBC 10.3  --   NEUTROABS 7.8*  --   HGB 15.1 15.6  HCT 42.5 46.0  MCV 99.5  --   PLT 189  --    Basic Metabolic Panel: Recent Labs  Lab 08/12/17 1528 08/12/17 1612  NA 127* 128*  K 4.6 4.6  CL 96* 95*  CO2 20*  --   GLUCOSE 146* 146*  BUN 20 23*  CREATININE 1.04 1.00  CALCIUM 8.5*  --    GFR: CrCl cannot be calculated (Unknown ideal weight.). Liver Function Tests: No results for input(s): AST, ALT, ALKPHOS, BILITOT, PROT, ALBUMIN in the last 168 hours. No results for input(s): LIPASE, AMYLASE in the last 168 hours. No results for input(s): AMMONIA in the last 168 hours. Coagulation Profile: No results for input(s): INR, PROTIME in the last 168 hours. Cardiac Enzymes: No results for input(s): CKTOTAL, CKMB, CKMBINDEX, TROPONINI in the last 168 hours. BNP (last 3 results) No results for input(s): PROBNP in the last 8760 hours. HbA1C: No results for input(s): HGBA1C in the last 72 hours. CBG: No results for input(s): GLUCAP in the last 168 hours. Lipid Profile: No results for input(s): CHOL, HDL, LDLCALC, TRIG, CHOLHDL, LDLDIRECT in the last 72 hours. Thyroid Function Tests: No results for input(s): TSH, T4TOTAL, FREET4, T3FREE, THYROIDAB in the last 72 hours. Anemia Panel: No results for input(s): VITAMINB12, FOLATE, FERRITIN, TIBC, IRON, RETICCTPCT in the last 72 hours. Urine analysis:    Component Value Date/Time   COLORURINE AMBER (A) 01/27/2014 1002   APPEARANCEUR CLEAR 01/27/2014 1002   LABSPEC 1.024 01/27/2014 1002   PHURINE 7.0 01/27/2014 1002   GLUCOSEU NEGATIVE 01/27/2014 1002   HGBUR NEGATIVE 01/27/2014 1002   HGBUR negative 04/05/2009 0802   BILIRUBINUR NEGATIVE 01/27/2014 1002   BILIRUBINUR n 02/03/2011 1315   KETONESUR NEGATIVE 01/27/2014 1002   PROTEINUR NEGATIVE 01/27/2014 1002   UROBILINOGEN 1.0 01/27/2014 1002   NITRITE NEGATIVE  01/27/2014 1002   LEUKOCYTESUR NEGATIVE 01/27/2014 1002   Sepsis Labs: @LABRCNTIP (procalcitonin:4,lacticidven:4) )No results found for this or any previous visit (from the past 240 hour(s)).   Radiological Exams on Admission: Dg Chest Port 1 View  Result Date: 08/12/2017 CLINICAL DATA:  Shortness of breath. EXAM: PORTABLE CHEST 1 VIEW COMPARISON:  Chest x-ray dated January 27, 2014. FINDINGS: Cardiomegaly. Pulmonary vascular congestion with diffuse interstitial thickening. Patchy consolidation in the left lower lobe. Streaky opacities at the right lung base likely reflect atelectasis. Probable small bilateral pleural effusions. No pneumothorax. No acute osseous abnormality. IMPRESSION: 1. Cardiomegaly with prominent vascular congestion, interstitial edema, and small bilateral pleural effusions. 2. Patchy consolidation in the left lower lobe could reflect atelectasis or pneumonia. Electronically Signed   By: Titus Dubin M.D.   On: 08/12/2017 15:47    EKG: (Independently reviewed) atrial fibrillation with ventricular rate 141 bpm, QTC 538 ms with underlying left bundle branch block and frequent unifocal PVCs,  Assessment/Plan Principal Problem:   Acute respiratory failure with hypoxia 2/2 Acute on chronic diastolic heart failure/bicuspid aortic valve/pulmonary hypertension -Patient presents with 1 week of cough and congestion in the context of reemergence of atrial fibrillation with palpitations at home -Apparently was started on antibiotics as an outpatient earlier this week. -Elevated BNP, abnormal chest x-ray with edema and crackles on exam consistent with volume overload/heart failure -Lasix 40 mg IV every 12 hours -Suspect tachycardia mediated heart failure with superimposed diastolic heart failure -In addition to diuretics, primary goal for treatment is rate control -Daily weights,  strict I's/O -Echocardiogram April 2018: Mild LVH, preserved LVEF, grade 2 diastolic dysfunction,  bicuspid aortic valve with mild stenosis and associated moderate pulmonary hypertension 57 mmHg, mild mitral regurgitation -Repeat echocardiogram this admission -Holding ARB in favor of rate control and aggressive diuresis (carvedilol and Micardis)-defer to cardiology whether to resume carvedilol since focus will be on rate control (see below) -Do not suspect patient has pneumonia but for completeness of exam will check Procalcitonin and respiratory viral panel  ** 1832 Since my initial evaluation patient has been weaned to nasal cannula oxygen and is tolerating well-BiPAP as needed  Active Problems:   Atrial fibrillation with RVR  -Previously maintaining sinus rhythm on Tikosyn -Does have prolonged QTC in context of rapid rate and frequent PVCs-tentatively ordered Tikosyn pending cardiology evaluation -As documented above suspect this is contributing to acute heart failure exacerbation in regards to persistent tachycardia -Defer to cardiology whether they wish to resume carvedilol as adjunct to rate control -Continue Xarelto -CHA2DS2-VASc=4    GERD (gastroesophageal reflux disease) -Continue PPI and H2 blocker    HLD -Continue omega-3 fatty acid and statin    Acute hyponatremia -Presumed secondary to volume overload -Follow labs    **Additional lab, imaging and/or diagnostic evaluation at discretion of supervising physician  DVT prophylaxis: Xarelto Code Status: Full Family Communication: No family at bedside Disposition Plan: Home Consults called: Cardiology/Dr. [place]    Dionis Autry L. ANP-BC [facility] Pager [phone]   If 7PM-7AM, please contact night-coverage [email or web address] Password [facility]  08/12/2017, 6:13 PM

## 2017-08-12 NOTE — ED Triage Notes (Signed)
Per EMS: pt from home with severe labored breathing, congested cough, pale and clammy.  PT was 78% Sp02 upon EMS arrival, at that time EMS placed pt on CPAP and administered 5 of albuterol.  PTA vitals: 130/90, 44 RR, 100-120 HR.   Pt denies CP, N/V/D.

## 2017-08-12 NOTE — Progress Notes (Signed)
Pt taken off Bipap at this time, placed on 5L HFNC.  Pt tolerating well, RTwill monitor

## 2017-08-12 NOTE — Progress Notes (Signed)
Pt placed on Bipap at this time per MD order, pt tolerating well. RT will monitor

## 2017-08-12 NOTE — Consult Note (Signed)
Cardiology Admission History and Physical:   Patient ID: BORNA WESSINGER; MRN: [id number]; DOB: Oct 22, 1940   Admission date: 08/12/2017  Primary Care Provider: [name], MD Primary Cardiologist: Virl Axe, MD  Primary Electrophysiologist:    Chief Complaint:  Shortness of breath  Patient Profile:   KYLON PHILBROOK is a 77 y.o. 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   History of Present Illness:   Mr. Witty is known to this clinic and follows with [name] and in the Afib clinic. He has a history of tachycardia-mediated cardiomyopathy with a reduced EF of 35%. This resolved on echo 2014. He has a history of atrial fibrillation and has done well on tikosyn and xarelto. He has not missed a dose of xarelto. He is very compliant on medications.   He was seen by [name] 09/2016 and was in sinus rhythm in the 41s at that visit. When he was seen in Afib clinic in 02/2017 he was in Afib, but unaware of his rhythm. He was asymptomatic.    Today, he reports onset of stomach virus, congestion, and dry cough over the past week. On Monday, he was prescribed cipro and anti-emesis medication for cough. Today, he reports shortness of breath prompting him to present to the ED. He attempted to go to the gym this morning, but was fatigued and short of breath. Had to "take it easy." He thinks he has been in Afib for 3-4 days. When he is in Afib, he can't do as much at the gym and with daily activities. He denies chest pain, dizziness, feelings of syncope.   On arrival, CXR with effusion and congestion. He received 20 mg IV lasix x 1. He was initially placed on BiPAP and has since transitioned to [place]. He feels much better after the IV lasix.    Past Medical History:  Diagnosis Date  . Arthritis   . Bicuspid aortic  valve   . Cardiomyopathy -resolved    tachycardia-induced, EF 55% June 2009  . CHF (congestive heart failure) ([place])    JULY 2014  . GERD (gastroesophageal reflux disease)   . History of cardioversion 12/16/2012  . History of stomach ulcers   . Hypertension   . Left bundle branch block   . Paroxysmal atrial fibrillation ([facility])   . Sinus bradycardia   . Status post clamping of cerebral aneurysm 80    Past Surgical History:  Procedure Laterality Date  . CARDIAC CATHETERIZATION    . CARDIOVERSION N/A 12/16/2012   Procedure: CARDIOVERSION;  Surgeon: Lelon Perla, MD;  Location: [facility] ENDOSCOPY;  Service: Cardiovascular;  Laterality: N/A;  . CATARACT EXTRACTION  05/2015   bilateral  . cerebral anuersym post clips    . fractured left arm    . HERNIA REPAIR     lft  . INGUINAL HERNIA REPAIR  07/02/2011   Procedure: LAPAROSCOPIC INGUINAL HERNIA;  Surgeon: Harl Bowie, MD;  Location: [facility];  Service: General;  Laterality: Left;  Laparoscopic left inguinal hernia repair and mesh  . left tendon repair     lft foot  . ROTATOR CUFF REPAIR     lf  . TONSILLECTOMY    . TOTAL KNEE ARTHROPLASTY Bilateral 02/06/2014   Procedure: TOTAL KNEE BILATERAL;  Surgeon: Mauri Pole, MD;  Location: [facility] ORS;  Service: Orthopedics;  Laterality: Bilateral;  . WRIST GANGLION EXCISION     lft     Medications Prior to Admission: Prior to Admission medications   Medication Sig Start Date End Date Taking? Authorizing Provider  acetaminophen (TYLENOL) 500 MG tablet Take 1,000 mg by mouth every 6 (six) hours as needed for mild pain or moderate pain. Reported on 08/01/2015    [provider]  ALPRAZolam Duanne Moron) 0.5 MG tablet Take 1 tablet (0.5 mg total) by mouth 2 (two) times daily as needed for anxiety (separate by 8 hours from temazepam). 08/04/17   [name], MD  amLODipine (NORVASC) 10 MG tablet TAKE 1 TABLET(10 MG) BY MOUTH DAILY 05/29/17   [name], MD  amLODipine (NORVASC) 10 MG  tablet TAKE 1 TABLET(10 MG) BY MOUTH DAILY 06/30/17   [name], MD  atorvastatin (LIPITOR) 40 MG tablet Take 1 tablet (40 mg total) by mouth once a week. 07/31/16   [name], MD  atorvastatin (LIPITOR) 40 MG tablet TAKE 1 TABLET(40 MG) BY MOUTH 1 TIME A WEEK 06/29/17   [name], MD  b complex vitamins tablet Take 1 tablet by mouth daily.     [provider]  carvedilol (COREG) 3.125 MG tablet TAKE 1 TABLET(3.125 MG) BY MOUTH TWICE DAILY 11/24/16   Deboraha Sprang, MD  dofetilide (TIKOSYN) 500 MCG capsule TAKE ONE CAPSULE BY MOUTH EVERY 12 HOURS 10/27/16   Deboraha Sprang, MD  finasteride (PROPECIA) 1 MG tablet TAKE 1 TABLET(1 MG) BY MOUTH DAILY 03/21/15   [name], MD  finasteride (PROPECIA) 1 MG tablet TAKE 1 TABLET(1 MG) BY MOUTH DAILY 04/20/17   [name], MD  fish oil-omega-3 fatty acids 1000 MG capsule Take 1,000 mg by mouth daily.     [provider]  Flaxseed, Linseed, (RA FLAX SEED OIL 1000 PO) Take 1,000 mg by mouth 3 (three) times daily.     [provider]  meloxicam (MOBIC) 15 MG tablet TAKE 1 TABLET BY MOUTH DAILY 07/10/17   [name], MD  Multiple Vitamins-Minerals (ICAPS AREDS 2) CAPS Take by mouth.    [provider]  omeprazole (PRILOSEC) 20 MG capsule TAKE 1 CAPSULE BY MOUTH DAILY 08/03/17   Milus Banister, MD  rivaroxaban (XARELTO) 20 MG TABS tablet Take 1 tablet (20 mg total) by mouth daily with supper. 03/24/17   Sherran Needs, NP  rivaroxaban (XARELTO) 20 MG TABS tablet Take 1 tablet (20 mg total) by mouth daily with supper. 03/24/17   Sherran Needs, NP  telmisartan (MICARDIS) 80 MG tablet TAKE 1 TABLET BY MOUTH EVERY NIGHT AT BEDTIME 03/24/17   [name], MD  temazepam (RESTORIL) 15 MG capsule TAKE ONE CAPSULE BY MOUTH AT BEDTIME AS NEEDED 07/27/17   [name], MD     Allergies:   No Known Allergies  Social History:   Social History   Socioeconomic History  . Marital  status: Single    Spouse name: Not on file  . Number of children: 0  . Years of education: Not on file  . Highest education level: Not on file  Social Needs  . Financial resource strain: Not on file  . Food insecurity - worry: Not on file  . Food insecurity - inability: Not on file  . Transportation needs - medical: Not on file  . Transportation needs - non-medical: Not on file  Occupational History  . Occupation: Retired  Tobacco Use  .  Smoking status: Former Smoker    Packs/day: 1.50    Years: 25.00    Pack years: 37.50    Last attempt to quit: 05/26/1986    Years since quitting: 31.2  . Smokeless tobacco: Never Used  Substance and Sexual Activity  . Alcohol use: Yes    Alcohol/week: 0.5 oz    Types: 1 Standard drinks or equivalent per week    Comment: stopped drinking   . Drug use: No  . Sexual activity: Not Currently  Other Topics Concern  . Not on file  Social History Narrative   Homosexual. Lives alone. Not sexually active.       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 There is no history of Colon cancer.    ROS:  Please see the history of present illness. Nonproductive cough and dyspnea on exertion but no hemoptysis, dysphagia, odynophagia, melena or hematochezia. All other ROS reviewed and negative.     Physical Exam/Data:   Vitals:   08/12/17 1615 08/12/17 1645 08/12/17 1715 08/12/17 1800  BP: 111/81 (!) 124/91 116/90   Pulse: (!) 110 69 67 95  Resp:      Temp:      TempSrc:      SpO2: 94% 97% 95% 96%    Intake/Output Summary (Last 24 hours) at 08/12/2017 1841 Last data filed at 08/12/2017 1702 Gross per 24 hour  Intake 100 ml  Output -  Net 100 ml   There were no vitals filed for  this visit. There is no height or weight on file to calculate BMI.  General:  Well nourished, well developed, in no acute distress HEENT: normal Lymph: no adenopathy Neck: supple Endocrine:  No thryomegaly Vascular: No carotid bruits; FA pulses 2+ bilaterally without bruits  Cardiac:  Irregular and tachycardic, 2/6 systolic murmur. Lungs:  Diminished BS Abd: soft, nontender, no hepatomegaly  Ext: no edema Musculoskeletal:  No deformities, BUE and BLE strength normal and equal Skin: warm and dry  Neuro:  CNs 2-12 intact, no focal abnormalities noted Psych:  Normal affect     Relevant CV Studies:  Echo 09/17/16 Study Conclusions  - Left ventricle: The cavity size was normal. Wall thickness was   increased in a pattern of mild LVH. Systolic function was normal.   The estimated ejection fraction was in the range of 55% to 60%.   Wall motion was normal; there were no regional wall motion   abnormalities. Features are consistent with a pseudonormal left   ventricular filling pattern, with concomitant abnormal relaxation   and increased filling pressure (grade 2 diastolic dysfunction). - Aortic valve: Functionally bicuspid; moderately thickened,   moderately calcified leaflets. Valve mobility was restricted.   Fixed non coronary cusp. There was mild stenosis. There was   moderate regurgitation. Peak velocity (S): 256 cm/s. Mean   gradient (S): 15 mm Hg. - Aorta: Ascending aortic diameter: 40 mm (S). - Ascending aorta: The ascending aorta was mildly dilated. - Mitral valve: There was mild regurgitation. - Left atrium: The atrium was moderately dilated. - Right ventricle: The cavity size was mildly dilated. Wall   thickness was normal. - Right atrium: The atrium was mildly dilated. - Pulmonary arteries:  Systolic pressure was moderately increased.   PA peak pressure: 57 mm Hg (S).  Impressions:  - When compared to prior, mildly increased aortic stenosis (mean   prior 13, now  85mmHg).  Laboratory Data:  Chemistry Recent Labs  Lab 08/12/17 1528 08/12/17 1612  NA 127* 128*  K 4.6 4.6  CL 96* 95*  CO2 20*  --   GLUCOSE 146* 146*  BUN 20 23*  CREATININE 1.04 1.00  CALCIUM 8.5*  --   GFRNONAA >60  --   GFRAA >60  --   ANIONGAP 11  --     Hematology Recent Labs  Lab 08/12/17 1528 08/12/17 1612  WBC 10.3  --   RBC 4.27  --   HGB 15.1 15.6  HCT 42.5 46.0  MCV 99.5  --   MCH 35.4*  --   MCHC 35.5  --   RDW 13.4  --   PLT 189  --     Recent Labs  Lab 08/12/17 1609  TROPIPOC 0.01    BNP Recent Labs  Lab 08/12/17 1531  BNP 649.3*   Radiology/Studies:  Dg Chest Port 1 View  Result Date: 08/12/2017 CLINICAL DATA:  Shortness of breath. EXAM: PORTABLE CHEST 1 VIEW COMPARISON:  Chest x-ray dated January 27, 2014. FINDINGS: Cardiomegaly. Pulmonary vascular congestion with diffuse interstitial thickening. Patchy consolidation in the left lower lobe. Streaky opacities at the right lung base likely reflect atelectasis. Probable small bilateral pleural effusions. No pneumothorax. No acute osseous abnormality. IMPRESSION: 1. Cardiomegaly with prominent vascular congestion, interstitial edema, and small bilateral pleural effusions. 2. Patchy consolidation in the left lower lobe could reflect atelectasis or pneumonia. Electronically Signed   By: Titus Dubin M.D.   On: 08/12/2017 15:47    Assessment and Plan:   1. Paroxysmal Atrial fibrillation He has not missed doses of tikosyn or xarelto. Pt would like to postpone cardioversion at this time. He started ABX on Monday, which he attributes to this bout of Afib. He thinks he has been in Afib for 3-4 days. This may be contributing to his shortness of breath and pulmonary edema.   2. Pulmonary edema, shortness of breath CXR with effusion and congestion.  BNP 649.3 with a sCr 1.04.    3. Bicuspid aortic valve, aortic stenosis This may be contributing to his volume overload. Will repeat echo here.      Severity of Illness: The appropriate patient status for this patient is INPATIENT. Inpatient status is judged to be reasonable and necessary in order to provide the required intensity of service to ensure the patient's safety. The patient's presenting symptoms, physical exam findings, and initial radiographic and laboratory data in the context of their chronic comorbidities is felt to place them at high risk for further clinical deterioration. Furthermore, it is not anticipated that the patient will be medically stable for discharge from the hospital within 2 midnights of admission. The following factors support the patient status of inpatient.   " The patient's presenting symptoms include shortness of breath. " The worrisome physical exam findings include crackles in bases. " The initial radiographic and laboratory data are worrisome because of elevated BNP. " The chronic co-morbidities include bicuspid aortic valve, Afib.   * I certify that at the point of admission it is my clinical judgment that the patient will require inpatient hospital care spanning beyond 2 midnights from the point of admission due to high intensity of service, high risk for further deterioration and high frequency of surveillance required.*  For questions or updates, please contact [facility] Please consult [email or web address] for contact info under Cardiology/STEMI.    Signed, Kirk Ruths, MD  08/12/2017 6:41 PM  As above, patient seen and examined.  Briefly he is a 77 year old male with past medical history of tachycardia mediated cardiomyopathy improved, paroxysmal atrial fibrillation on Tikosyn, bicuspid aortic valve with moderate aortic insufficiency and mild aortic stenosis, hypertension, gastroesophageal reflux disease with recurrent atrial fibrillation and congestive heart failure.  Last echocardiogram April 2018 showed normal LV function, moderate aortic insufficiency and mild aortic stenosis, mild  mitral regurgitation and biatrial enlargement.  Patient has done well on Tikosyn therapy with only rare occasions of atrial fibrillation.  Patient states he had nausea approximately 1 week ago.  He states he was given an antibiotic.  No records are available.  This past weekend he complained of cough that was nonproductive.  No fevers, chills, diarrhea or myalgias.  Today he developed increasing dyspnea as well as orthopnea.  No pedal edema, chest pain or syncope.  He has noticed that he has had worsening fatigue with activities that is typical of his symptoms with atrial fibrillation.  He presented to the emergency room and cardiology asked to evaluate. Chest x-ray shows interstitial edema and small effusions.  Sodium is 128 with BUN 23 and creatinine 1.0.  White blood cell count 10.3.  BNP 649.  Electrocardiogram shows atrial fibrillation with left bundle branch block; PVCs or aberrantly conducted beats.  1 persistent atrial fibrillation-patient states he has been in atrial fibrillation since Saturday.  This is associated with worsening dyspnea on exertion and fatigue.  He now has developed congestive heart failure.  Continue carvedilol for rate control but increase dose to 6.25 mg twice daily.  Continue Xarelto and patient states he has not missed any doses. CHADSvasc 4.  I think his atrial fibrillation is causing his congestive heart failure.  I recommended cardioversion tomorrow if he does not convert on his own.  He is hesitant but willing to consider.  Continue tikosyn.  If he has more frequent episodes in the future he may require additional therapy/consideration of ablation. Check Mg.   2 acute diastolic congestive heart failure-patient is volume overloaded on examination.  Agree with Lasix.  Discontinue nitroglycerin paste.  Follow renal function.  Check echocardiogram for LV function.  3 history of bicuspid valve with aortic insufficiency and stenosis-Question if valvular disease is contributing to  congestive heart failure.  Repeat echocardiogram.  4 hypertension-continue preadmission blood pressure medications and follow blood pressure.  5 hyperlipidemia-continue statin.  Kirk Ruths, MD

## 2017-08-12 NOTE — ED Provider Notes (Signed)
[facility] EMERGENCY DEPARTMENT Provider Note   CSN: [id number] Arrival date & time: 08/12/17  1518     History   Chief Complaint Chief Complaint  Patient presents with  . Shortness of Breath    HPI Travis [name] is a 77 y.o. male.  77 yo M with a chief complaint of shortness of breath.  The patient had what he thinks was a stomach virus over the past few days.  Then had sudden worsening shortness of breath over the past few hours.  He called 911.  He denies chest pain has a mild cough.  Denies continued vomiting or diarrhea.  Denies abdominal pain.  Denies fevers or chills.  Denies lower extremity edema denies history of heart failure.  Per EMS the patient was pale cool and diaphoretic.  Required CPAP due to hypoxia into the low 70s.   The history is provided by the patient.  Shortness of Breath  This is a new problem. The average episode lasts 2 days. The problem occurs continuously.The current episode started yesterday. The problem has been rapidly worsening. Pertinent negatives include no fever, no headaches, no chest pain, no vomiting, no abdominal pain and no rash. He has tried nothing for the symptoms. The treatment provided no relief. He has had prior hospitalizations. He has had prior ED visits. Associated medical issues include heart failure.    Past Medical History:  Diagnosis Date  . Arthritis   . Bicuspid aortic valve   . Cardiomyopathy -resolved    tachycardia-induced, EF 55% June 2009  . CHF (congestive heart failure) ([place])    JULY 2014  . GERD (gastroesophageal reflux disease)   . History of cardioversion 12/16/2012  . History of stomach ulcers   . Hypertension   . Left bundle branch block   . Paroxysmal atrial fibrillation (HCC)   . Sinus bradycardia   . Status post clamping of cerebral aneurysm 79    Patient Active Problem List   Diagnosis Date Noted  . Acute respiratory failure with hypoxia ([place]) 08/12/2017  . Macrocytic anemia  08/01/2015  . Hyperlipidemia 12/19/2014  . Male pattern baldness 03/06/2014  . Anxiety state 03/06/2014  . Insomnia 03/06/2014  . Overweight (BMI 25.0-29.9) 02/08/2014  . S/P TKR (total knee replacement) 02/08/2014  . Sinus bradycardia 01/04/2013  . Pulmonary hypertension ([place]) 03/09/2012  . Hyponatremia 02/21/2012  . Bicuspid aortic valve   . Paroxysmal atrial fibrillation (HCC)   . Hypertension   . Cardiomyopathy, rate related with resolution now recurrent 12/08/2011  . Left bundle branch block 12/10/2010  . DRY MOUTH 11/07/2009  . Carotid bruit 07/05/2009  . GERD 01/07/2008  . Osteoarthritis 02/04/2007  . GANGLION CYST, WRIST, LEFT 02/04/2007    Past Surgical History:  Procedure Laterality Date  . CARDIAC CATHETERIZATION    . CARDIOVERSION N/A 12/16/2012   Procedure: CARDIOVERSION;  Surgeon: Lelon Perla, MD;  Location: [facility] ENDOSCOPY;  Service: Cardiovascular;  Laterality: N/A;  . CATARACT EXTRACTION  05/2015   bilateral  . cerebral anuersym post clips    . fractured left arm    . HERNIA REPAIR     lft  . INGUINAL HERNIA REPAIR  07/02/2011   Procedure: LAPAROSCOPIC INGUINAL HERNIA;  Surgeon: Harl Bowie, MD;  Location: [facility];  Service: General;  Laterality: Left;  Laparoscopic left inguinal hernia repair and mesh  . left tendon repair     lft foot  . ROTATOR CUFF REPAIR     lf  . TONSILLECTOMY    .  TOTAL KNEE ARTHROPLASTY Bilateral 02/06/2014   Procedure: TOTAL KNEE BILATERAL;  Surgeon: Mauri Pole, MD;  Location: [facility] ORS;  Service: Orthopedics;  Laterality: Bilateral;  . WRIST GANGLION EXCISION     lft       Home Medications    Prior to Admission medications   Medication Sig Start Date End Date Taking? Authorizing Provider  acetaminophen (TYLENOL) 500 MG tablet Take 1,000 mg by mouth every 6 (six) hours as needed for mild pain or moderate pain. Reported on 08/01/2015    [provider]  ALPRAZolam Duanne Moron) 0.5 MG tablet Take 1 tablet (0.5 mg  total) by mouth 2 (two) times daily as needed for anxiety (separate by 8 hours from temazepam). 08/04/17   [name], MD  amLODipine (NORVASC) 10 MG tablet TAKE 1 TABLET(10 MG) BY MOUTH DAILY 05/29/17   [name], MD  amLODipine (NORVASC) 10 MG tablet TAKE 1 TABLET(10 MG) BY MOUTH DAILY 06/30/17   [name], MD  atorvastatin (LIPITOR) 40 MG tablet Take 1 tablet (40 mg total) by mouth once a week. 07/31/16   [name], MD  atorvastatin (LIPITOR) 40 MG tablet TAKE 1 TABLET(40 MG) BY MOUTH 1 TIME A WEEK 06/29/17   [name], MD  b complex vitamins tablet Take 1 tablet by mouth daily.     [provider]  carvedilol (COREG) 3.125 MG tablet TAKE 1 TABLET(3.125 MG) BY MOUTH TWICE DAILY 11/24/16   Deboraha Sprang, MD  dofetilide (TIKOSYN) 500 MCG capsule TAKE ONE CAPSULE BY MOUTH EVERY 12 HOURS 10/27/16   Deboraha Sprang, MD  finasteride (PROPECIA) 1 MG tablet TAKE 1 TABLET(1 MG) BY MOUTH DAILY 03/21/15   [name], MD  finasteride (PROPECIA) 1 MG tablet TAKE 1 TABLET(1 MG) BY MOUTH DAILY 04/20/17   [name], MD  fish oil-omega-3 fatty acids 1000 MG capsule Take 1,000 mg by mouth daily.     [provider]  Flaxseed, Linseed, (RA FLAX SEED OIL 1000 PO) Take 1,000 mg by mouth 3 (three) times daily.     [provider]  meloxicam (MOBIC) 15 MG tablet TAKE 1 TABLET BY MOUTH DAILY 07/10/17   [name], MD  Multiple Vitamins-Minerals (ICAPS AREDS 2) CAPS Take by mouth.    [provider]  omeprazole (PRILOSEC) 20 MG capsule TAKE 1 CAPSULE BY MOUTH DAILY 08/03/17   Milus Banister, MD  rivaroxaban (XARELTO) 20 MG TABS tablet Take 1 tablet (20 mg total) by mouth daily with supper. 03/24/17   Sherran Needs, NP  rivaroxaban (XARELTO) 20 MG TABS tablet Take 1 tablet (20 mg total) by mouth daily with supper. 03/24/17   Sherran Needs, NP  telmisartan (MICARDIS) 80 MG tablet TAKE 1 TABLET BY MOUTH EVERY NIGHT AT BEDTIME  03/24/17   [name], MD  temazepam (RESTORIL) 15 MG capsule TAKE ONE CAPSULE BY MOUTH AT BEDTIME AS NEEDED 07/27/17   [name], MD    Family History Family History  Problem Relation Age of Onset  . Stroke Father 73       smoker  . Lung cancer Mother 12       former smoker  . Stroke Mother   . Stroke Brother   . Colon cancer Neg Hx     Social History Social History   Tobacco Use  . Smoking status: Former Smoker    Packs/day: 1.50    Years: 25.00    Pack  years: 37.50    Last attempt to quit: 05/26/1986    Years since quitting: 31.2  . Smokeless tobacco: Never Used  Substance Use Topics  . Alcohol use: Yes    Alcohol/week: 0.5 oz    Types: 1 Standard drinks or equivalent per week    Comment: stopped drinking   . Drug use: No     Allergies   Patient has no known allergies.   Review of Systems Review of Systems  Constitutional: Negative for chills and fever.  HENT: Negative for congestion and facial swelling.   Eyes: Negative for discharge and visual disturbance.  Respiratory: Positive for shortness of breath.   Cardiovascular: Negative for chest pain and palpitations.  Gastrointestinal: Negative for abdominal pain, diarrhea and vomiting.  Musculoskeletal: Negative for arthralgias and myalgias.  Skin: Negative for color change and rash.  Neurological: Negative for tremors, syncope and headaches.  Psychiatric/Behavioral: Negative for confusion and dysphoric mood.     Physical Exam Updated Vital Signs BP (!) 124/91   Pulse 69   Temp 98.1 F (36.7 C) (Axillary)   Resp (!) 22   SpO2 97%   Physical Exam  Constitutional: He is oriented to person, place, and time. He appears well-developed and well-nourished.  HENT:  Head: Normocephalic and atraumatic.  Eyes: EOM are normal. Pupils are equal, round, and reactive to light.  Neck: Normal range of motion. Neck supple. No JVD present.  Cardiovascular: An irregularly irregular rhythm present.  Tachycardia present. Exam reveals no gallop and no friction rub.  No murmur heard. Pulmonary/Chest: He is in respiratory distress. He has no wheezes. He has rales in the right middle field, the right lower field, the left middle field and the left lower field.  Abdominal: He exhibits no distension and no mass. There is no tenderness. There is no rebound and no guarding.  Musculoskeletal: Normal range of motion.       Right lower leg: He exhibits edema ( 1+).       Left lower leg: He exhibits edema (1+).  Neurological: He is alert and oriented to person, place, and time.  Skin: No rash noted. No pallor.  Psychiatric: He has a normal mood and affect. His behavior is normal.  Nursing note and vitals reviewed.    ED Treatments / Results  Labs (all labs ordered are listed, but only abnormal results are displayed) Labs Reviewed  CBC WITH DIFFERENTIAL/PLATELET - Abnormal; Notable for the following components:      Result Value   MCH 35.4 (*)    Neutro Abs 7.8 (*)    All other components within normal limits  BASIC METABOLIC PANEL - Abnormal; Notable for the following components:   Sodium 127 (*)    Chloride 96 (*)    CO2 20 (*)    Glucose, Bld 146 (*)    Calcium 8.5 (*)    All other components within normal limits  I-STAT CHEM 8, ED - Abnormal; Notable for the following components:   Sodium 128 (*)    Chloride 95 (*)    BUN 23 (*)    Glucose, Bld 146 (*)    Calcium, Ion 1.04 (*)    TCO2 20 (*)    All other components within normal limits  RESPIRATORY PANEL BY PCR  BRAIN NATRIURETIC PEPTIDE  PROCALCITONIN  I-STAT TROPONIN, ED    EKG  EKG Interpretation  Date/Time:  Wednesday August 12 2017 15:26:03 EDT Ventricular Rate:  141 PR Interval:    QRS Duration: 166  QT Interval:  351 QTC Calculation: 538 R Axis:   -32 Text Interpretation:  Atrial fibrillation Left bundle branch block No significant change since last tracing Confirmed by Deno Etienne [phone]) on 08/12/2017 3:29:44  PM       Radiology Dg Chest Port 1 View  Result Date: 08/12/2017 CLINICAL DATA:  Shortness of breath. EXAM: PORTABLE CHEST 1 VIEW COMPARISON:  Chest x-ray dated January 27, 2014. FINDINGS: Cardiomegaly. Pulmonary vascular congestion with diffuse interstitial thickening. Patchy consolidation in the left lower lobe. Streaky opacities at the right lung base likely reflect atelectasis. Probable small bilateral pleural effusions. No pneumothorax. No acute osseous abnormality. IMPRESSION: 1. Cardiomegaly with prominent vascular congestion, interstitial edema, and small bilateral pleural effusions. 2. Patchy consolidation in the left lower lobe could reflect atelectasis or pneumonia. Electronically Signed   By: Titus Dubin M.D.   On: 08/12/2017 15:47    Procedures Procedures (including critical care time)  Medications Ordered in ED Medications  azithromycin (ZITHROMAX) 500 mg in sodium chloride 0.9 % 250 mL IVPB (500 mg Intravenous New Bag/Given 08/12/17 1702)  ipratropium-albuterol (DUONEB) 0.5-2.5 (3) MG/3ML nebulizer solution 3 mL (3 mLs Nebulization Given 08/12/17 1604)  nitroGLYCERIN (NITROGLYN) 2 % ointment 1 inch (1 inch Topical Given 08/12/17 1604)  cefTRIAXone (ROCEPHIN) 1 g in sodium chloride 0.9 % 100 mL IVPB (0 g Intravenous Stopped 08/12/17 1702)  furosemide (LASIX) injection 20 mg (20 mg Intravenous Given 08/12/17 1627)     Initial Impression / Assessment and Plan / ED Course  I have reviewed the triage vital signs and the nursing notes.  Pertinent labs & imaging results that were available during my care of the patient were reviewed by me and considered in my medical decision making (see chart for details).     77 yo M with a chief complaint shortness of breath.  Clinically the patient has acute pulmonary edema.  Will start on BiPAP nitroglycerin.   Patient with significant improvement, BP improved on bipap.  CXR viewed by me at bedside most likely pulmonary edema.  Read by  rads as ? pna in the LLL.  With patient with ongoing upper respiratory illness, will start on abx.   Reassessed and continues to do well on Bipap.  Lab reviewed with normal trop, renal function at baseline.  Admit.   CRITICAL CARE Performed by: Cecilio Asper   Total critical care time: 80 minutes  Critical care time was exclusive of separately billable procedures and treating other patients.  Critical care was necessary to treat or prevent imminent or life-threatening deterioration.  Critical care was time spent personally by me on the following activities: development of treatment plan with patient and/or surrogate as well as nursing, discussions with consultants, evaluation of patient's response to treatment, examination of patient, obtaining history from patient or surrogate, ordering and performing treatments and interventions, ordering and review of laboratory studies, ordering and review of radiographic studies, pulse oximetry and re-evaluation of patient's condition.  The patients results and plan were reviewed and discussed.   Any x-rays performed were independently reviewed by myself.   Differential diagnosis were considered with the presenting HPI.  Medications  azithromycin (ZITHROMAX) 500 mg in sodium chloride 0.9 % 250 mL IVPB (500 mg Intravenous New Bag/Given 08/12/17 1702)  ipratropium-albuterol (DUONEB) 0.5-2.5 (3) MG/3ML nebulizer solution 3 mL (3 mLs Nebulization Given 08/12/17 1604)  nitroGLYCERIN (NITROGLYN) 2 % ointment 1 inch (1 inch Topical Given 08/12/17 1604)  cefTRIAXone (ROCEPHIN) 1 g in sodium chloride 0.9 % 100  mL IVPB (0 g Intravenous Stopped 08/12/17 1702)  furosemide (LASIX) injection 20 mg (20 mg Intravenous Given 08/12/17 1627)    Vitals:   08/12/17 1545 08/12/17 1604 08/12/17 1615 08/12/17 1645  BP: (!) 116/94  111/81 (!) 124/91  Pulse: 66  (!) 110 69  Resp:      Temp:      TempSrc:      SpO2: 96% 94% 94% 97%    Final diagnoses:  Acute  pulmonary edema (HCC)  Acute respiratory failure with hypoxia (HCC)  Community acquired pneumonia of left lower lobe of lung ([place])    Admission/ observation were discussed with the admitting physician, patient and/or family and they are comfortable with the plan.   Final Clinical Impressions(s) / ED Diagnoses   Final diagnoses:  Acute pulmonary edema ([place])  Acute respiratory failure with hypoxia ([place])  Community acquired pneumonia of left lower lobe of lung [facility])    ED Discharge Orders    None       Deno Etienne, DO 08/12/17 1708

## 2017-08-13 ENCOUNTER — Other Ambulatory Visit: Payer: Self-pay

## 2017-08-13 ENCOUNTER — Ambulatory Visit: Payer: Medicare Other | Admitting: Physician Assistant

## 2017-08-13 ENCOUNTER — Inpatient Hospital Stay (HOSPITAL_COMMUNITY): Payer: Medicare Other

## 2017-08-13 DIAGNOSIS — I5033 Acute on chronic diastolic (congestive) heart failure: Secondary | ICD-10-CM

## 2017-08-13 DIAGNOSIS — I351 Nonrheumatic aortic (valve) insufficiency: Secondary | ICD-10-CM

## 2017-08-13 DIAGNOSIS — E871 Hypo-osmolality and hyponatremia: Secondary | ICD-10-CM

## 2017-08-13 DIAGNOSIS — I272 Pulmonary hypertension, unspecified: Secondary | ICD-10-CM

## 2017-08-13 DIAGNOSIS — I4891 Unspecified atrial fibrillation: Secondary | ICD-10-CM

## 2017-08-13 DIAGNOSIS — Q231 Congenital insufficiency of aortic valve: Secondary | ICD-10-CM

## 2017-08-13 DIAGNOSIS — J9601 Acute respiratory failure with hypoxia: Secondary | ICD-10-CM

## 2017-08-13 LAB — CBC
HCT: 38.7 % — ABNORMAL LOW (ref 39.0–52.0)
Hemoglobin: 13.4 g/dL (ref 13.0–17.0)
MCH: 34 pg (ref 26.0–34.0)
MCHC: 34.6 g/dL (ref 30.0–36.0)
MCV: 98.2 fL (ref 78.0–100.0)
Platelets: 190 10*3/uL (ref 150–400)
RBC: 3.94 MIL/uL — ABNORMAL LOW (ref 4.22–5.81)
RDW: 13.3 % (ref 11.5–15.5)
WBC: 8.4 10*3/uL (ref 4.0–10.5)

## 2017-08-13 LAB — ECHOCARDIOGRAM COMPLETE
Height: 68 in
Weight: 2800 oz

## 2017-08-13 LAB — RESPIRATORY PANEL BY PCR
Adenovirus: NOT DETECTED
Bordetella pertussis: NOT DETECTED
CHLAMYDOPHILA PNEUMONIAE-RVPPCR: NOT DETECTED
CORONAVIRUS 229E-RVPPCR: NOT DETECTED
Coronavirus HKU1: NOT DETECTED
Coronavirus NL63: NOT DETECTED
Coronavirus OC43: NOT DETECTED
INFLUENZA A-RVPPCR: NOT DETECTED
INFLUENZA B-RVPPCR: NOT DETECTED
MYCOPLASMA PNEUMONIAE-RVPPCR: NOT DETECTED
Metapneumovirus: NOT DETECTED
PARAINFLUENZA VIRUS 4-RVPPCR: NOT DETECTED
Parainfluenza Virus 1: NOT DETECTED
Parainfluenza Virus 2: NOT DETECTED
Parainfluenza Virus 3: NOT DETECTED
RESPIRATORY SYNCYTIAL VIRUS-RVPPCR: NOT DETECTED
Rhinovirus / Enterovirus: NOT DETECTED

## 2017-08-13 LAB — BASIC METABOLIC PANEL
Anion gap: 11 (ref 5–15)
BUN: 17 mg/dL (ref 6–20)
CO2: 23 mmol/L (ref 22–32)
CREATININE: 0.88 mg/dL (ref 0.61–1.24)
Calcium: 8.3 mg/dL — ABNORMAL LOW (ref 8.9–10.3)
Chloride: 95 mmol/L — ABNORMAL LOW (ref 101–111)
GFR calc Af Amer: >60 mL/min (ref >60)
GFR calc non Af Amer: >60 mL/min (ref >60)
Glucose, Bld: 113 mg/dL — ABNORMAL HIGH (ref 65–99)
Potassium: 3.7 mmol/L (ref 3.5–5.1)
SODIUM: 129 mmol/L — AB (ref 135–145)

## 2017-08-13 LAB — MAGNESIUM: MAGNESIUM: 2 mg/dL (ref 1.7–2.4)

## 2017-08-13 MED ORDER — SODIUM CHLORIDE 0.9 % IV SOLN
INTRAVENOUS | Status: DC
  Administered 2017-08-13: 21:00:00 via INTRAVENOUS

## 2017-08-13 MED ORDER — FUROSEMIDE 40 MG PO TABS
40.0000 mg | ORAL_TABLET | Freq: Every day | ORAL | Status: DC
  Administered 2017-08-13 – 2017-08-16 (×4): 40 mg via ORAL
  Filled 2017-08-13 (×2): qty 1
  Filled 2017-08-13: qty 2
  Filled 2017-08-13: qty 1

## 2017-08-13 MED ORDER — CARVEDILOL 6.25 MG PO TABS
6.2500 mg | ORAL_TABLET | Freq: Two times a day (BID) | ORAL | Status: DC
  Administered 2017-08-13 – 2017-08-14 (×3): 6.25 mg via ORAL
  Filled 2017-08-13 (×3): qty 1

## 2017-08-13 NOTE — ED Notes (Signed)
Notified secretary to order heart healthy food tray for patient.

## 2017-08-13 NOTE — Care Management Note (Signed)
**Note Travis-Identified via Obfuscation** Case Management Note  Patient Details  Name: [name] MRN: [id number] Date of Birth: [birth year]  Subjective/Objective:                  77 yo M with afib RVR and acute pulmonary edema due to acute CHF. From home alone.   Action/Plan: Admit status INPATIENT (AFIB); anticipate discharge [place].   Expected Discharge Date:  (UNKNOWN)               Expected Discharge Plan:  [place]  In-House Referral:     Discharge planning Services  CM Consult  Post Acute Care Choice:    Choice offered to:     DME Arranged:    DME Agency:     HH Arranged:    [place] Agency:     Status of Service:  In process, will continue to follow  If discussed at Long Length of Stay Meetings, dates discussed:    Additional Comments: PCP: Jocelyn Lamer, RN 08/13/2017, 11:38 AM

## 2017-08-13 NOTE — Evaluation (Signed)
Physical Therapy Evaluation Patient Details Name: Travis [name] MRN: [id number] DOB: 13-Dec-1940 Today's Date: 08/13/2017   History of Present Illness  Pt is a 77 y/o male admitted secondary to worsening SOB. Found to be in a fib and have CHF as well. DG of chest showed cardiomegaly and prominent vascular congestions and patchy consolidation of L lower lobe. PMH includes HTN, a fib, CHF, tachycardia-mediated cardiomyopathy, and bilat TKA.   Clinical Impression  Pt admitted secondary to problem above with deficits below. Mobility limited this session secondary to elevated HR upon standing at EOB. Upon standing HR elevated to 145. Returned to supine, and HR returned to upper 90s to low 100s. Oxygen sats at 92% and above on 5L. Pt requiring supervision to min guard to stand at EOB. Anticipate pt will progress well and may not need follow up PT, as pt very active prior to admission, however, will continue to follow acute and update recommendations as needed.     Follow Up Recommendations Other (comment);Supervision - Intermittent(TBD pending progress)    Equipment Recommendations  None recommended by PT    Recommendations for Other Services       Precautions / Restrictions Precautions Precautions: Other (comment) Precaution Comments: Watch HR and Oxygen  Restrictions Weight Bearing Restrictions: No      Mobility  Bed Mobility Overal bed mobility: Independent                Transfers Overall transfer level: Needs assistance Equipment used: None Transfers: Sit to/from Stand Sit to Stand: Supervision;Min guard         General transfer comment: Supervision to min guard for safety. Mild unsteadiness noted upon standing, however, may have been due to L calf cramp. Upon standing HR elevated to 145, so further mobility deferred. Returned to supine, and HR returned to upper 90s to low 100s. Oxygen sats at 92% and above on 5L.   Ambulation/Gait             General Gait  Details: Deferred secondary to elevated HR.   Stairs            Wheelchair Mobility    Modified Rankin (Stroke Patients Only)       Balance Overall balance assessment: Needs assistance Sitting-balance support: No upper extremity supported;Feet unsupported Sitting balance-Leahy Scale: Good     Standing balance support: No upper extremity supported Standing balance-Leahy Scale: Fair                               Pertinent Vitals/Pain Pain Assessment: Faces Faces Pain Scale: Hurts whole lot Pain Location: L calf cramp Pain Descriptors / Indicators: Cramping Pain Intervention(s): Limited activity within patient's tolerance;Repositioned;Monitored during session    Home Living Family/patient expects to be discharged to:: Private residence Living Arrangements: Alone Available Help at Discharge: Other (Comment)(reports does not have anyone to check on him ) Type of Home: House Home Access: Stairs to enter Entrance Stairs-Rails: None Entrance Stairs-Number of Steps: 2 Home Layout: One level Home Equipment: [place] - 2 wheels;Shower seat;Cane - single point;Crutches      Prior Function Level of Independence: Independent         Comments: 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     Hand Dominance        Extremity/Trunk Assessment   Upper Extremity Assessment Upper Extremity Assessment: Overall WFL for tasks assessed    Lower  Extremity Assessment Lower Extremity Assessment: Overall WFL for tasks assessed    Cervical / Trunk Assessment Cervical / Trunk Assessment: Normal  Communication   Communication: No difficulties  Cognition Arousal/Alertness: Awake/alert Behavior During Therapy: WFL for tasks assessed/performed Overall Cognitive Status: Within Functional Limits for tasks assessed                                        General Comments      Exercises     Assessment/Plan    PT  Assessment Patient needs continued PT services  PT Problem List Cardiopulmonary status limiting activity;Decreased balance;Decreased activity tolerance;Decreased mobility       PT Treatment Interventions Gait training;Stair training;Neuromuscular re-education;Functional mobility training;Balance training;Therapeutic exercise;Therapeutic activities;Patient/family education    PT Goals (Current goals can be found in the Care Plan section)  Acute Rehab PT Goals Patient Stated Goal: to go home  PT Goal Formulation: With patient Time For Goal Achievement: 08/27/17 Potential to Achieve Goals: Good    Frequency Min 3X/week   Barriers to discharge Decreased caregiver support      Co-evaluation               AM-PAC PT "6 Clicks" Daily Activity  Outcome Measure Difficulty turning over in bed (including adjusting bedclothes, sheets and blankets)?: None Difficulty moving from lying on back to sitting on the side of the bed? : None Difficulty sitting down on and standing up from a chair with arms (e.g., wheelchair, bedside commode, etc,.)?: Unable Help needed moving to and from a bed to chair (including a wheelchair)?: A Little Help needed walking in hospital room?: A Little Help needed climbing 3-5 steps with a railing? : A Little 6 Click Score: 18    End of Session Equipment Utilized During Treatment: Gait belt;Oxygen Activity Tolerance: Treatment limited secondary to medical complications (Comment)(elevated HR ) Patient left: in bed;with call bell/phone within reach Nurse Communication: Mobility status(elevated HR ) PT Visit Diagnosis: Unsteadiness on feet (R26.81);Difficulty in walking, not elsewhere classified (R26.2)    Time: 8756-4332 PT Time Calculation (min) (ACUTE ONLY): 13 min   Charges:   PT Evaluation $PT Eval Moderate Complexity: 1 Mod     PT G Codes:        Leighton Ruff, PT, DPT  Acute Rehabilitation Services  Pager: [phone]   Rudean Hitt 08/13/2017, 12:16 PM

## 2017-08-13 NOTE — ED Notes (Signed)
ECHO continue to be at bedside.

## 2017-08-13 NOTE — ED Notes (Signed)
ECHO at bedside.

## 2017-08-13 NOTE — Progress Notes (Signed)
  Echocardiogram 2D Echocardiogram has been performed.  Travis [name] 08/13/2017, 1:38 PM

## 2017-08-13 NOTE — Progress Notes (Signed)
PROGRESS NOTE    Travis [name]  KDX:[id number] DOB: 1940-07-25 DOA: 08/12/2017 PCP: [name], MD   Brief Narrative:  77 y.o. WM PMHx reflux, atrial fibrillation on Tikosyn previously maintaining sinus rhythm, bicuspid aortic valve with associated Chronic Diastolic CHF, HTN, LBBB, Paroxysmal Atrial Fibrillation, Sinus Bradycardia, remote Tobacco Abuse.    Patient reports that for 1 week he has had cough and congestion with the cough being nonproductive.  This is not associated with fevers or other constitutional symptoms.  He reports for the past "several days" he has known that he is in atrial fibrillation stating he has been having frequent palpitations.  Presented to the ER by EMS noted to be in respiratory distress with increased work of breathing, congested cough and pale and clammy.  Upon EMS arrival to the home patient's pulse oximetry was 78% and he was placed on CPAP.  He was given 5 mg of albuterol.  He was otherwise hemodynamically stable except for tachycardia with underlying atrial fibrillation.  Chest x-ray in the ER revealed cardiomegaly with pulmonary vascular congestion and interstitial edema with small bilateral pleural effusions.  Patchy consolidation in the left lower lobe.  He was placed on BiPAP.  Patient was afebrile.  He did not have any leukocytosis.  BNP was elevated at 649.  He has been given 20 mg of Lasix in the ER.  He continues to have episodic RVR with ventricular rates increasing into the 130s with minimal activity such as leaning forward in the bed.  He remains in atrial fibrillation and continues to have frequent unifocal PVCs.  Potassium is 4.6.    Subjective: 3/21 A/O x4 states has had several DCCV episodes in the past, which have always worked.  Currently negative CP, positive S OB, negative abdominal pain.  Not on home O2.    Assessment & Plan:   Principal Problem:   Acute respiratory failure with hypoxia (HCC) Active Problems:   Acute on chronic  diastolic heart failure (HCC)   Atrial fibrillation with RVR (HCC)   GERD (gastroesophageal reflux disease)   Aortic valve, bicuspid   Acute hyponatremia   Acute respiratory failure with hypoxia --Secondary to A. fib with RVR. - Crackles and cough resolved. - Titrate O2 to maintain SPO2> 90%  -Patient's presentation not consistent with infection. -Respiratory virus panel negative  Acute on Chronic Diastolic CHF/bicuspid aortic valve -Strict in and out -Daily weight -Coreg 6.25 mg BID -Tikosyn 500 mcg BID -Lasix 40 mg daily -Last echo April 9767 grade 2 diastolic dysfunction.  Pulmonary HTN 57 mmHg. -Echocardiogram pending  Pulmonary HTN -CHF  A. fib with RVR(CHA2DS2-VASc=4)  -See CHF -Xarelto -Cardiology evaluated patient and appears plans for TEE/DC CV on 3/22   HLD -Continue omega-3 fatty acid and statin -Lipitor 40 mg daily -Lipid panel pending  Acute Hyponatremia - Volume overload, acute CHF./ - Asymptomatic monitor closely.     DVT prophylaxis: Xarelto Code Status: Full Family Communication: Wife at bedside for plan of care Disposition Plan: TBD    Consultants:  Cardiology   Procedures/Significant Events:     I have personally reviewed and interpreted all radiology studies and my findings are as above.  VENTILATOR SETTINGS:    Cultures 3/20 respiratory virus panel negative    Antimicrobials: Anti-infectives (From admission, onward)   Start     Stop   08/12/17 1600  cefTRIAXone (ROCEPHIN) 1 g in sodium chloride 0.9 % 100 mL IVPB     08/12/17 1702   08/12/17 1600  azithromycin (ZITHROMAX) 500 mg in sodium chloride 0.9 % 250 mL IVPB     08/12/17 1853       Devices    LINES / TUBES:      Continuous Infusions: . sodium chloride       Objective: Vitals:   08/13/17 0700 08/13/17 0715 08/13/17 0730 08/13/17 0845  BP: 115/83 121/84 129/82 119/78  Pulse: (!) 58 99 (!) 111 (!) 106  Resp: (!) 21 (!) 25 16 16   Temp:        TempSrc:      SpO2: 93% 94% 94% 95%  Weight:      Height:        Intake/Output Summary (Last 24 hours) at 08/13/2017 6789 Last data filed at 08/13/2017 3810 Gross per 24 hour  Intake 350 ml  Output 2700 ml  Net -2350 ml   Filed Weights   08/13/17 1751  Weight: 175 lb (79.4 kg)    Examination:  General: A/O x4, positive acute respiratory distress Neck:  Negative scars, masses, torticollis, lymphadenopathy, JVD Lungs: Clear to auscultation bilaterally without wheezes or crackles Cardiovascular: Irregular regular rhythm and rate, without murmur gallop or rub normal S1 and S2 Abdomen: negative abdominal pain, nondistended, positive soft, bowel sounds, no rebound, no ascites, no appreciable mass Extremities: No significant cyanosis, clubbing, or edema bilateral lower extremities Skin: Negative rashes, lesions, ulcers Psychiatric:  Negative depression, negative anxiety, negative fatigue, negative mania  Central nervous system:  Cranial nerves II through XII intact, tongue/uvula midline, all extremities muscle strength 5/5, sensation intact throughout, negative dysarthria, negative expressive aphasia, negative receptive aphasia.  .     Data Reviewed: Care during the described time interval was provided by me .  I have reviewed this patient's available data, including medical history, events of note, physical examination, and all test results as part of my evaluation.   CBC: Recent Labs  Lab 08/12/17 1528 08/12/17 1612 08/13/17 0343  WBC 10.3  --  8.4  NEUTROABS 7.8*  --   --   HGB 15.1 15.6 13.4  HCT 42.5 46.0 38.7*  MCV 99.5  --  98.2  PLT 189  --  025   Basic Metabolic Panel: Recent Labs  Lab 08/12/17 1528 08/12/17 1612 08/12/17 1940 08/13/17 0343  NA 127* 128*  --  129*  K 4.6 4.6  --  3.7  CL 96* 95*  --  95*  CO2 20*  --   --  23  GLUCOSE 146* 146*  --  113*  BUN 20 23*  --  17  CREATININE 1.04 1.00  --  0.88  CALCIUM 8.5*  --   --  8.3*  MG  --   --  1.8  2.0   GFR: Estimated Creatinine Clearance: 68 mL/min (by C-G formula based on SCr of 0.88 mg/dL). Liver Function Tests: No results for input(s): AST, ALT, ALKPHOS, BILITOT, PROT, ALBUMIN in the last 168 hours. No results for input(s): LIPASE, AMYLASE in the last 168 hours. No results for input(s): AMMONIA in the last 168 hours. Coagulation Profile: No results for input(s): INR, PROTIME in the last 168 hours. Cardiac Enzymes: No results for input(s): CKTOTAL, CKMB, CKMBINDEX, TROPONINI in the last 168 hours. BNP (last 3 results) No results for input(s): PROBNP in the last 8760 hours. HbA1C: No results for input(s): HGBA1C in the last 72 hours. CBG: No results for input(s): GLUCAP in the last 168 hours. Lipid Profile: No results for input(s): CHOL, HDL, LDLCALC, TRIG, CHOLHDL, LDLDIRECT  in the last 72 hours. Thyroid Function Tests: Recent Labs    08/12/17 1940  TSH 2.120   Anemia Panel: No results for input(s): VITAMINB12, FOLATE, FERRITIN, TIBC, IRON, RETICCTPCT in the last 72 hours. Urine analysis:    Component Value Date/Time   COLORURINE AMBER (A) 01/27/2014 1002   APPEARANCEUR CLEAR 01/27/2014 1002   LABSPEC 1.024 01/27/2014 1002   PHURINE 7.0 01/27/2014 1002   GLUCOSEU NEGATIVE 01/27/2014 1002   HGBUR NEGATIVE 01/27/2014 1002   HGBUR negative 04/05/2009 0802   BILIRUBINUR NEGATIVE 01/27/2014 1002   BILIRUBINUR n 02/03/2011 1315   KETONESUR NEGATIVE 01/27/2014 1002   PROTEINUR NEGATIVE 01/27/2014 1002   UROBILINOGEN 1.0 01/27/2014 1002   NITRITE NEGATIVE 01/27/2014 1002   LEUKOCYTESUR NEGATIVE 01/27/2014 1002   Sepsis Labs: @LABRCNTIP (procalcitonin:4,lacticidven:4)  ) Recent Results (from the past 240 hour(s))  Respiratory Panel by PCR     Status: None   Collection Time: 08/12/17  9:40 PM  Result Value Ref Range Status   Adenovirus NOT DETECTED NOT DETECTED Final   Coronavirus 229E NOT DETECTED NOT DETECTED Final   Coronavirus HKU1 NOT DETECTED NOT DETECTED  Final   Coronavirus NL63 NOT DETECTED NOT DETECTED Final   Coronavirus OC43 NOT DETECTED NOT DETECTED Final   Metapneumovirus NOT DETECTED NOT DETECTED Final   Rhinovirus / Enterovirus NOT DETECTED NOT DETECTED Final   Influenza A NOT DETECTED NOT DETECTED Final   Influenza B NOT DETECTED NOT DETECTED Final   Parainfluenza Virus 1 NOT DETECTED NOT DETECTED Final   Parainfluenza Virus 2 NOT DETECTED NOT DETECTED Final   Parainfluenza Virus 3 NOT DETECTED NOT DETECTED Final   Parainfluenza Virus 4 NOT DETECTED NOT DETECTED Final   Respiratory Syncytial Virus NOT DETECTED NOT DETECTED Final   Bordetella pertussis NOT DETECTED NOT DETECTED Final   Chlamydophila pneumoniae NOT DETECTED NOT DETECTED Final   Mycoplasma pneumoniae NOT DETECTED NOT DETECTED Final    Comment: Performed at [facility], [place] [address], [place], [place] [address]         Radiology Studies: Dg Chest Port 1 View  Result Date: 08/12/2017 CLINICAL DATA:  Shortness of breath. EXAM: PORTABLE CHEST 1 VIEW COMPARISON:  Chest x-ray dated January 27, 2014. FINDINGS: Cardiomegaly. Pulmonary vascular congestion with diffuse interstitial thickening. Patchy consolidation in the left lower lobe. Streaky opacities at the right lung base likely reflect atelectasis. Probable small bilateral pleural effusions. No pneumothorax. No acute osseous abnormality. IMPRESSION: 1. Cardiomegaly with prominent vascular congestion, interstitial edema, and small bilateral pleural effusions. 2. Patchy consolidation in the left lower lobe could reflect atelectasis or pneumonia. Electronically Signed   By: Titus Dubin M.D.   On: 08/12/2017 15:47        Scheduled Meds: . [START ON 08/16/2017] atorvastatin  40 mg Oral Weekly  . carvedilol  6.25 mg Oral BID WC  . dofetilide  500 mcg Oral BID  . furosemide  40 mg Oral Daily  . omega-3 acid ethyl esters  1,000 mg Oral Daily  . pantoprazole  40 mg Oral Daily  . rivaroxaban  20 mg Oral Q  supper  . sodium chloride flush  3 mL Intravenous Q12H   Continuous Infusions: . sodium chloride       LOS: 1 day    Time spent: 40 minutes    WOODS, Geraldo Docker, MD [facility] Pager [phone]   If 7PM-7AM, please contact night-coverage [email or web address] Password [facility] 08/13/2017, 9:17 AM

## 2017-08-13 NOTE — Progress Notes (Signed)
Progress Note  Patient Name: Travis [name] Date of Encounter: 08/13/2017  Primary Cardiologist: Virl Axe, MD   Subjective   Breathing better  Inpatient Medications    Scheduled Meds: . [START ON 08/16/2017] atorvastatin  40 mg Oral Weekly  . dofetilide  500 mcg Oral BID  . furosemide  40 mg Intravenous Q12H  . omega-3 acid ethyl esters  1,000 mg Oral Daily  . pantoprazole  40 mg Oral Daily  . rivaroxaban  20 mg Oral Q supper  . sodium chloride flush  3 mL Intravenous Q12H   Continuous Infusions: . sodium chloride     PRN Meds: sodium chloride, acetaminophen, ALPRAZolam, ondansetron (ZOFRAN) IV, sodium chloride flush, temazepam   Vital Signs    Vitals:   08/13/17 0630 08/13/17 0645 08/13/17 0700 08/13/17 0715  BP: 113/71 (!) 114/92 115/83 121/84  Pulse: (!) 102 (!) 46 (!) 58 99  Resp: (!) 25 (!) 21 (!) 21 (!) 25  Temp:      TempSrc:      SpO2: 93% 92% 93% 94%  Weight:      Height:        Intake/Output Summary (Last 24 hours) at 08/13/2017 0733 Last data filed at 08/12/2017 2217 Gross per 24 hour  Intake 350 ml  Output 1100 ml  Net -750 ml   Filed Weights   08/13/17 0620  Weight: 175 lb (79.4 kg)    Telemetry    afib at rate of 120s - Personally Reviewed  ECG    N/A  Physical Exam   GEN: No acute distress.   Neck: No JVD Cardiac: IR IR, systolic murmurs, rubs, or gallops.  Respiratory: Clear to auscultation bilaterally. GI: Soft, nontender, non-distended  MS: No edema; No deformity. Neuro:  Nonfocal  Psych: Normal affect   Labs    Chemistry Recent Labs  Lab 08/12/17 1528 08/12/17 1612 08/13/17 0343  NA 127* 128* 129*  K 4.6 4.6 3.7  CL 96* 95* 95*  CO2 20*  --  23  GLUCOSE 146* 146* 113*  BUN 20 23* 17  CREATININE 1.04 1.00 0.88  CALCIUM 8.5*  --  8.3*  GFRNONAA >60  --  >60  GFRAA >60  --  >60  ANIONGAP 11  --  11     Hematology Recent Labs  Lab 08/12/17 1528 08/12/17 1612 08/13/17 0343  WBC 10.3  --  8.4  RBC  4.27  --  3.94*  HGB 15.1 15.6 13.4  HCT 42.5 46.0 38.7*  MCV 99.5  --  98.2  MCH 35.4*  --  34.0  MCHC 35.5  --  34.6  RDW 13.4  --  13.3  PLT 189  --  190    Cardiac EnzymesNo results for input(s): TROPONINI in the last 168 hours.  Recent Labs  Lab 08/12/17 1609  TROPIPOC 0.01     BNP Recent Labs  Lab 08/12/17 1531  BNP 649.3*     DDimer No results for input(s): DDIMER in the last 168 hours.   Radiology    Dg Chest Port 1 View  Result Date: 08/12/2017 CLINICAL DATA:  Shortness of breath. EXAM: PORTABLE CHEST 1 VIEW COMPARISON:  Chest x-ray dated January 27, 2014. FINDINGS: Cardiomegaly. Pulmonary vascular congestion with diffuse interstitial thickening. Patchy consolidation in the left lower lobe. Streaky opacities at the right lung base likely reflect atelectasis. Probable small bilateral pleural effusions. No pneumothorax. No acute osseous abnormality. IMPRESSION: 1. Cardiomegaly with prominent vascular congestion, interstitial edema, and  small bilateral pleural effusions. 2. Patchy consolidation in the left lower lobe could reflect atelectasis or pneumonia. Electronically Signed   By: Titus Dubin M.D.   On: 08/12/2017 15:47    Cardiac Studies   Pending echo   Patient Profile     Travis [name] is a 77 y.o. 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 He was found to have afib RVR and acute pulmonary edema due to acute CHF.    Assessment & Plan    1. Persistent atrial fibrillation - patient states he has been in atrial fibrillation since Saturday.  This is associated with worsening dyspnea on exertion and fatigue.  He now has developed congestive heart failure. - Plan was to start coreg at hight dose of 6.25 however did not order on  admission. Rate in 100-120s. Will resume this morning.  - He is also NPO for possible cardioversion however missed Xaralto dose last night in ER (Last dose Tuesday Evening) Will give Xarelto ASAP this morning. MD to review for cardioversion. Alternatively can do TEE/DCCV (seems the patient is not interested) or rate control with outpatient plan for cardioversion.  -  Continue tikosyn.  If he has more frequent episodes in the future he may require additional therapy/consideration of ablation.   2. Acute diastolic CHF - Given IV lasix x 2 last night with significant improvement in breathing. Lung clear. Eulovemic. Will switch to lasix po 40mg  qd. Follow closely.   3 History of bicuspid valve with aortic insufficiency and stenosis- - Question if valvular disease is contributing to congestive heart failure.  Repeat echocardiogram is pending.   4. HTN - BP stable.     For questions or updates, please contact [place] Please consult [email or web address] for contact info under Cardiology/STEMI.      Jarrett Soho, PA  08/13/2017, 7:33 AM

## 2017-08-13 NOTE — ED Notes (Signed)
Food tray given to patient 

## 2017-08-13 NOTE — ED Notes (Signed)
ECHO completed

## 2017-08-14 ENCOUNTER — Inpatient Hospital Stay (HOSPITAL_COMMUNITY): Payer: Medicare Other | Admitting: Anesthesiology

## 2017-08-14 ENCOUNTER — Encounter (HOSPITAL_COMMUNITY)
Admission: EM | Disposition: A | Discharge status: Home / Self Care | Payer: Self-pay | Source: Home / Self Care | Attending: Internal Medicine

## 2017-08-14 ENCOUNTER — Inpatient Hospital Stay (HOSPITAL_COMMUNITY): Payer: Medicare Other

## 2017-08-14 ENCOUNTER — Encounter (HOSPITAL_COMMUNITY): Payer: Self-pay | Admitting: *Deleted

## 2017-08-14 DIAGNOSIS — I5043 Acute on chronic combined systolic (congestive) and diastolic (congestive) heart failure: Secondary | ICD-10-CM

## 2017-08-14 DIAGNOSIS — J189 Pneumonia, unspecified organism: Secondary | ICD-10-CM

## 2017-08-14 DIAGNOSIS — I351 Nonrheumatic aortic (valve) insufficiency: Secondary | ICD-10-CM

## 2017-08-14 DIAGNOSIS — J181 Lobar pneumonia, unspecified organism: Secondary | ICD-10-CM

## 2017-08-14 HISTORY — PX: CARDIOVERSION: SHX1299

## 2017-08-14 HISTORY — PX: TEE WITHOUT CARDIOVERSION: SHX5443

## 2017-08-14 LAB — CBC
HCT: 41.5 % (ref 39.0–52.0)
Hemoglobin: 14.6 g/dL (ref 13.0–17.0)
MCH: 35 pg — ABNORMAL HIGH (ref 26.0–34.0)
MCHC: 35.2 g/dL (ref 30.0–36.0)
MCV: 99.5 fL (ref 78.0–100.0)
PLATELETS: 193 10*3/uL (ref 150–400)
RBC: 4.17 MIL/uL — AB (ref 4.22–5.81)
RDW: 13.5 % (ref 11.5–15.5)
WBC: 8.3 10*3/uL (ref 4.0–10.5)

## 2017-08-14 LAB — BASIC METABOLIC PANEL
Anion gap: 13 (ref 5–15)
BUN: 16 mg/dL (ref 6–20)
CALCIUM: 8.6 mg/dL — AB (ref 8.9–10.3)
CO2: 25 mmol/L (ref 22–32)
CREATININE: 0.93 mg/dL (ref 0.61–1.24)
Chloride: 91 mmol/L — ABNORMAL LOW (ref 101–111)
GFR calc non Af Amer: >60 mL/min (ref >60)
GLUCOSE: 115 mg/dL — AB (ref 65–99)
Potassium: 3.3 mmol/L — ABNORMAL LOW (ref 3.5–5.1)
Sodium: 129 mmol/L — ABNORMAL LOW (ref 135–145)

## 2017-08-14 LAB — LIPID PANEL
CHOL/HDL RATIO: 2.3 ratio
CHOLESTEROL: 133 mg/dL (ref 0–200)
HDL: 57 mg/dL (ref >40)
LDL Cholesterol: 65 mg/dL (ref 0–99)
Triglycerides: 54 mg/dL (ref <150)
VLDL: 11 mg/dL (ref 0–40)

## 2017-08-14 LAB — MAGNESIUM: Magnesium: 2.1 mg/dL (ref 1.7–2.4)

## 2017-08-14 SURGERY — ECHOCARDIOGRAM, TRANSESOPHAGEAL
Anesthesia: General

## 2017-08-14 MED ORDER — RIVAROXABAN 20 MG PO TABS: 20.0000 mg | ORAL_TABLET | Freq: Every day | ORAL | Status: DC

## 2017-08-14 MED ORDER — EPHEDRINE SULFATE 50 MG/ML IJ SOLN
INTRAMUSCULAR | Status: DC | PRN
  Administered 2017-08-14 (×3): 5 mg via INTRAVENOUS

## 2017-08-14 MED ORDER — METOPROLOL TARTRATE 5 MG/5ML IV SOLN
5.0000 mg | Freq: Once | INTRAVENOUS | Status: AC
  Administered 2017-08-14: 5 mg via INTRAVENOUS
  Filled 2017-08-14: qty 5

## 2017-08-14 MED ORDER — POTASSIUM CHLORIDE CRYS ER 20 MEQ PO TBCR
40.0000 meq | EXTENDED_RELEASE_TABLET | ORAL | Status: AC
  Administered 2017-08-14: 40 meq via ORAL
  Filled 2017-08-14: qty 2

## 2017-08-14 MED ORDER — BUTAMBEN-TETRACAINE-BENZOCAINE 2-2-14 % EX AERO
INHALATION_SPRAY | CUTANEOUS | Status: DC | PRN
  Administered 2017-08-14: 2 via TOPICAL

## 2017-08-14 MED ORDER — ONDANSETRON HCL 4 MG/2ML IJ SOLN
INTRAMUSCULAR | Status: DC | PRN
  Administered 2017-08-14: 4 mg via INTRAVENOUS

## 2017-08-14 MED ORDER — CARVEDILOL 12.5 MG PO TABS
12.5000 mg | ORAL_TABLET | Freq: Two times a day (BID) | ORAL | Status: DC
  Administered 2017-08-15: 12.5 mg via ORAL
  Filled 2017-08-14: qty 1

## 2017-08-14 MED ORDER — PHENYLEPHRINE HCL 10 MG/ML IJ SOLN
INTRAMUSCULAR | Status: DC | PRN
  Administered 2017-08-14: 120 ug via INTRAVENOUS
  Administered 2017-08-14 (×2): 40 ug via INTRAVENOUS
  Administered 2017-08-14: 120 ug via INTRAVENOUS
  Administered 2017-08-14: 80 ug via INTRAVENOUS

## 2017-08-14 MED ORDER — POTASSIUM CHLORIDE 10 MEQ/100ML IV SOLN
10.0000 meq | INTRAVENOUS | Status: AC
  Administered 2017-08-14 (×3): 10 meq via INTRAVENOUS
  Filled 2017-08-14 (×3): qty 100

## 2017-08-14 MED ORDER — PROPOFOL 10 MG/ML IV BOLUS
INTRAVENOUS | Status: DC | PRN
  Administered 2017-08-14 (×3): 20 mg via INTRAVENOUS

## 2017-08-14 MED ORDER — PROPOFOL 500 MG/50ML IV EMUL
INTRAVENOUS | Status: DC | PRN
  Administered 2017-08-14: 100 ug/kg/min via INTRAVENOUS

## 2017-08-14 NOTE — Progress Notes (Signed)
Pt resting comfortably on 5L HFNC. No distress. Will continue to monitor.

## 2017-08-14 NOTE — CV Procedure (Signed)
TEE: Anesthesia Propofol   EF 30% diffuse hypokinesis No LAA thrombus Trivial MR Moderate AS with mild MR Mild RV enlargement Severe LAE No effusion   DCC x 1 150J converted from afib rate 120 to SR/SB rates 55 BP low at first responded to neo/ephedrine per anethesia  No immediate neurologic sequelae  Jenkins Rouge

## 2017-08-14 NOTE — Progress Notes (Signed)
Progress Note  Patient Name: Travis [name] Date of Encounter: 08/14/2017  Primary Cardiologist: Virl Axe, MD   Subjective   Feels better. Remains in a-fib with RVR despite adding coreg. Plan for TEE/DCCV today. He has diuresed 2.5L more overnight - now out 3.2L. Creatinine is stable - sodium is improving. Hypokalemic today at 3.3. LDL 65. Echo yesterday shows LVEF 30-35%, bicuspid aortic valve and probably moderate AS. This is a marked decline in LVEF from 08/2016, where he was 55-60% - rate-related? On 5L oxygen - but sats normal.  Inpatient Medications    Scheduled Meds: . [START ON 08/16/2017] atorvastatin  40 mg Oral Weekly  . carvedilol  6.25 mg Oral BID WC  . dofetilide  500 mcg Oral BID  . furosemide  40 mg Oral Daily  . omega-3 acid ethyl esters  1,000 mg Oral Daily  . pantoprazole  40 mg Oral Daily  . rivaroxaban  20 mg Oral Q supper  . sodium chloride flush  3 mL Intravenous Q12H   Continuous Infusions: . sodium chloride    . sodium chloride 20 mL/hr at 08/13/17 2057   PRN Meds: sodium chloride, acetaminophen, ALPRAZolam, ondansetron (ZOFRAN) IV, sodium chloride flush, temazepam   Vital Signs    Vitals:   08/13/17 2200 08/13/17 2300 08/14/17 0330 08/14/17 0500  BP:  112/72 114/75   Pulse:  96 94   Resp:  20 (!) 21   Temp:  97.7 F (36.5 C) (!) 97.4 F (36.3 C)   TempSrc:  Oral Oral   SpO2: 100% 95% 96%   Weight:    174 lb 13.2 oz (79.3 kg)  Height:        Intake/Output Summary (Last 24 hours) at 08/14/2017 0844 Last data filed at 08/14/2017 0549 Gross per 24 hour  Intake 365.33 ml  Output 1800 ml  Net -1434.67 ml   Filed Weights   08/13/17 0620 08/13/17 1900 08/14/17 0500  Weight: 175 lb (79.4 kg) 175 lb 4.3 oz (79.5 kg) 174 lb 13.2 oz (79.3 kg)    Telemetry    afib with RVR - Personally Reviewed  ECG    N/A  Physical Exam   General appearance: alert and no distress Neck: no carotid bruit, no JVD and thyroid not enlarged, symmetric, no  tenderness/mass/nodules Lungs: clear to auscultation bilaterally Heart: irregularly irregular rhythm, S1, S2 normal and diastolic murmur: mid diastolic 2/6, blowing at apex Abdomen: soft, non-tender; bowel sounds normal; no masses,  no organomegaly Extremities: extremities normal, atraumatic, no cyanosis or edema Pulses: 2+ and symmetric Skin: Skin color, texture, turgor normal. No rashes or lesions Neurologic: Grossly normal Psych: Pleasant   Labs    Chemistry Recent Labs  Lab 08/12/17 1528 08/12/17 1612 08/13/17 0343 08/14/17 0233  NA 127* 128* 129* 129*  K 4.6 4.6 3.7 3.3*  CL 96* 95* 95* 91*  CO2 20*  --  23 25  GLUCOSE 146* 146* 113* 115*  BUN 20 23* 17 16  CREATININE 1.04 1.00 0.88 0.93  CALCIUM 8.5*  --  8.3* 8.6*  GFRNONAA >60  --  >60 >60  GFRAA >60  --  >60 >60  ANIONGAP 11  --  11 13     Hematology Recent Labs  Lab 08/12/17 1528 08/12/17 1612 08/13/17 0343 08/14/17 0233  WBC 10.3  --  8.4 8.3  RBC 4.27  --  3.94* 4.17*  HGB 15.1 15.6 13.4 14.6  HCT 42.5 46.0 38.7* 41.5  MCV 99.5  --  98.2 99.5  MCH 35.4*  --  34.0 35.0*  MCHC 35.5  --  34.6 35.2  RDW 13.4  --  13.3 13.5  PLT 189  --  190 193    Cardiac EnzymesNo results for input(s): TROPONINI in the last 168 hours.  Recent Labs  Lab 08/12/17 1609  TROPIPOC 0.01     BNP Recent Labs  Lab 08/12/17 1531  BNP 649.3*     DDimer No results for input(s): DDIMER in the last 168 hours.   Radiology    Dg Chest Port 1 View  Result Date: 08/12/2017 CLINICAL DATA:  Shortness of breath. EXAM: PORTABLE CHEST 1 VIEW COMPARISON:  Chest x-ray dated January 27, 2014. FINDINGS: Cardiomegaly. Pulmonary vascular congestion with diffuse interstitial thickening. Patchy consolidation in the left lower lobe. Streaky opacities at the right lung base likely reflect atelectasis. Probable small bilateral pleural effusions. No pneumothorax. No acute osseous abnormality. IMPRESSION: 1. Cardiomegaly with prominent  vascular congestion, interstitial edema, and small bilateral pleural effusions. 2. Patchy consolidation in the left lower lobe could reflect atelectasis or pneumonia. Electronically Signed   By: Titus Dubin M.D.   On: 08/12/2017 15:47    Cardiac Studies   LV EF: 30% -   35%  ------------------------------------------------------------------- History:   PMH:  Bicuspid Aortic Valve. Paroxysmal A-Fib. CHF - Acute Diastolic 824.31.  Cardiomyopathy of unknown etiology. Congestive heart failure.  Risk factors:  Hypertension.  ------------------------------------------------------------------- Study Conclusions  - Left ventricle: The cavity size was normal. Wall thickness was   increased in a pattern of moderate LVH. Systolic function was   moderately to severely reduced. The estimated ejection fraction   was in the range of 30% to 35%. - Aortic valve: AV is thickened, calcified with restricted motion.   Peak and mean gradients through the valve are 16 and 11 mm Hg   respectively. Dimensionless index is 0.48. 2 D imaging suggests   moderate AS. There was mild regurgitation. - Left atrium: The atrium was severely dilated. - Right ventricle: The cavity size was mildly dilated. Systolic   function was mildly to moderately reduced. - Pulmonary arteries: PA peak pressure: 46 mm Hg (S).  Patient Profile     Travis [name] is a 77 y.o. 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 He was found to have afib RVR and acute pulmonary edema due to acute CHF.   Assessment & Plan    1. Persistent atrial fibrillation - plan for TEE/DCCV today - continue carvedilol and tikosyn  2. Acute CHF - LVEF newly reduced to 30-35% - ?tachy-mediated. Continues to diurese on po  lasix. On carvedilol and tikosyn. Was on telmisartan 80 mg QHS - holding now, BP normal. Would probably switch to [facility] prior to d/c. Wean down oxygen.  3 History of bicuspid valve with aortic insufficiency and stenosis- - Appears to have moderate AS with mild AI on echo  4. HTN - BP stable.   5. Hypokalemia - 3.3 today - repleting   For questions or updates, please contact [place] Please consult [email or web address] for contact info under Cardiology/STEMI.   Pixie Casino, MD, [facility], [place] Director of the Advanced Lipid Disorders &  Cardiovascular Risk Reduction Clinic Diplomate of the American Board of Clinical Lipidology  Attending Cardiologist  Direct Dial: [phone]  Fax: [phone]  Website:  [email or web address]  Pixie Casino, MD  08/14/2017, 8:44 AM

## 2017-08-14 NOTE — Progress Notes (Signed)
  Echocardiogram Echocardiogram Transesophageal has been performed.  Travis [name] M 08/14/2017, 1:24 PM

## 2017-08-14 NOTE — Transfer of Care (Signed)
Immediate Anesthesia Transfer of Care Note  Patient: Travis [name]  Procedure(s) Performed: TRANSESOPHAGEAL ECHOCARDIOGRAM (TEE) (N/A ) CARDIOVERSION (N/A )  Patient Location: Endoscopy Unit  Anesthesia Type:MAC  Level of Consciousness: awake, oriented and patient cooperative  Airway & Oxygen Therapy: Patient Spontanous Breathing and Patient connected to nasal cannula oxygen  Post-op Assessment: Report given to RN and Post -op Vital signs reviewed and stable  Post vital signs: Reviewed  Last Vitals:  Vitals Value Taken Time  BP 88/60 08/14/2017  1:24 PM  Temp    Pulse 68 08/14/2017  1:24 PM  Resp 12 08/14/2017  1:24 PM  SpO2 95 % 08/14/2017  1:24 PM  Vitals shown include unvalidated device data.  Last Pain:  Vitals:   08/14/17 1324  TempSrc: Oral  PainSc: 0-No pain         Complications: No apparent anesthesia complications

## 2017-08-14 NOTE — Consult Note (Addendum)
   [facility] Inpatient Consult   08/14/2017  KAHNE HELFAND 08-22-1940 [id number]    [facility] Care Management referral received.   Went to bedside to speak with Mr. Glascoe about [place] Management services. Mr. Groesbeck pleasantly declined [place] Management follow up.  He reports he is independent. He denies having any disease management, medication, or transportation needs. Mr. Lawhorn also reports he follows up with his physicians. His Primary Care Provider office ([facility]) is listed as doing transition of care post discharge.  Provided [facility] Care Management brochure with 24-hr nurse advice line magnet.  Made inpatient RNCM aware Mr. Chavarin declined [place] [facility] [facility] Management services.   Marthenia Rolling, MSN-Ed, RN,BSN [facility] Liaison [phone]

## 2017-08-14 NOTE — Progress Notes (Signed)
Patient is withholding consent for TEE scheduled for tommrow. Says physician did not explain to him what a TEE is what the plan is for possible cardioversion. [facility] was notified at 2000.

## 2017-08-14 NOTE — H&P (View-Only) (Signed)
Progress Note  Patient Name: Travis [name] Date of Encounter: 08/14/2017  Primary Cardiologist: Virl Axe, MD   Subjective   Feels better. Remains in a-fib with RVR despite adding coreg. Plan for TEE/DCCV today. He has diuresed 2.5L more overnight - now out 3.2L. Creatinine is stable - sodium is improving. Hypokalemic today at 3.3. LDL 65. Echo yesterday shows LVEF 30-35%, bicuspid aortic valve and probably moderate AS. This is a marked decline in LVEF from 08/2016, where he was 55-60% - rate-related? On 5L oxygen - but sats normal.  Inpatient Medications    Scheduled Meds: . [START ON 08/16/2017] atorvastatin  40 mg Oral Weekly  . carvedilol  6.25 mg Oral BID WC  . dofetilide  500 mcg Oral BID  . furosemide  40 mg Oral Daily  . omega-3 acid ethyl esters  1,000 mg Oral Daily  . pantoprazole  40 mg Oral Daily  . rivaroxaban  20 mg Oral Q supper  . sodium chloride flush  3 mL Intravenous Q12H   Continuous Infusions: . sodium chloride    . sodium chloride 20 mL/hr at 08/13/17 2057   PRN Meds: sodium chloride, acetaminophen, ALPRAZolam, ondansetron (ZOFRAN) IV, sodium chloride flush, temazepam   Vital Signs    Vitals:   08/13/17 2200 08/13/17 2300 08/14/17 0330 08/14/17 0500  BP:  112/72 114/75   Pulse:  96 94   Resp:  20 (!) 21   Temp:  97.7 F (36.5 C) (!) 97.4 F (36.3 C)   TempSrc:  Oral Oral   SpO2: 100% 95% 96%   Weight:    174 lb 13.2 oz (79.3 kg)  Height:        Intake/Output Summary (Last 24 hours) at 08/14/2017 0844 Last data filed at 08/14/2017 0549 Gross per 24 hour  Intake 365.33 ml  Output 1800 ml  Net -1434.67 ml   Filed Weights   08/13/17 0620 08/13/17 1900 08/14/17 0500  Weight: 175 lb (79.4 kg) 175 lb 4.3 oz (79.5 kg) 174 lb 13.2 oz (79.3 kg)    Telemetry    afib with RVR - Personally Reviewed  ECG    N/A  Physical Exam   General appearance: alert and no distress Neck: no carotid bruit, no JVD and thyroid not enlarged, symmetric, no  tenderness/mass/nodules Lungs: clear to auscultation bilaterally Heart: irregularly irregular rhythm, S1, S2 normal and diastolic murmur: mid diastolic 2/6, blowing at apex Abdomen: soft, non-tender; bowel sounds normal; no masses,  no organomegaly Extremities: extremities normal, atraumatic, no cyanosis or edema Pulses: 2+ and symmetric Skin: Skin color, texture, turgor normal. No rashes or lesions Neurologic: Grossly normal Psych: Pleasant   Labs    Chemistry Recent Labs  Lab 08/12/17 1528 08/12/17 1612 08/13/17 0343 08/14/17 0233  NA 127* 128* 129* 129*  K 4.6 4.6 3.7 3.3*  CL 96* 95* 95* 91*  CO2 20*  --  23 25  GLUCOSE 146* 146* 113* 115*  BUN 20 23* 17 16  CREATININE 1.04 1.00 0.88 0.93  CALCIUM 8.5*  --  8.3* 8.6*  GFRNONAA >60  --  >60 >60  GFRAA >60  --  >60 >60  ANIONGAP 11  --  11 13     Hematology Recent Labs  Lab 08/12/17 1528 08/12/17 1612 08/13/17 0343 08/14/17 0233  WBC 10.3  --  8.4 8.3  RBC 4.27  --  3.94* 4.17*  HGB 15.1 15.6 13.4 14.6  HCT 42.5 46.0 38.7* 41.5  MCV 99.5  --  98.2 99.5  MCH 35.4*  --  34.0 35.0*  MCHC 35.5  --  34.6 35.2  RDW 13.4  --  13.3 13.5  PLT 189  --  190 193    Cardiac EnzymesNo results for input(s): TROPONINI in the last 168 hours.  Recent Labs  Lab 08/12/17 1609  TROPIPOC 0.01     BNP Recent Labs  Lab 08/12/17 1531  BNP 649.3*     DDimer No results for input(s): DDIMER in the last 168 hours.   Radiology    Dg Chest Port 1 View  Result Date: 08/12/2017 CLINICAL DATA:  Shortness of breath. EXAM: PORTABLE CHEST 1 VIEW COMPARISON:  Chest x-ray dated January 27, 2014. FINDINGS: Cardiomegaly. Pulmonary vascular congestion with diffuse interstitial thickening. Patchy consolidation in the left lower lobe. Streaky opacities at the right lung base likely reflect atelectasis. Probable small bilateral pleural effusions. No pneumothorax. No acute osseous abnormality. IMPRESSION: 1. Cardiomegaly with prominent  vascular congestion, interstitial edema, and small bilateral pleural effusions. 2. Patchy consolidation in the left lower lobe could reflect atelectasis or pneumonia. Electronically Signed   By: Titus Dubin M.D.   On: 08/12/2017 15:47    Cardiac Studies   LV EF: 30% -   35%  ------------------------------------------------------------------- History:   PMH:  Bicuspid Aortic Valve. Paroxysmal A-Fib. CHF - Acute Diastolic 865.31.  Cardiomyopathy of unknown etiology. Congestive heart failure.  Risk factors:  Hypertension.  ------------------------------------------------------------------- Study Conclusions  - Left ventricle: The cavity size was normal. Wall thickness was   increased in a pattern of moderate LVH. Systolic function was   moderately to severely reduced. The estimated ejection fraction   was in the range of 30% to 35%. - Aortic valve: AV is thickened, calcified with restricted motion.   Peak and mean gradients through the valve are 16 and 11 mm Hg   respectively. Dimensionless index is 0.48. 2 D imaging suggests   moderate AS. There was mild regurgitation. - Left atrium: The atrium was severely dilated. - Right ventricle: The cavity size was mildly dilated. Systolic   function was mildly to moderately reduced. - Pulmonary arteries: PA peak pressure: 46 mm Hg (S).  Patient Profile     Travis [name] is a 77 y.o. 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 He was found to have afib RVR and acute pulmonary edema due to acute CHF.   Assessment & Plan    1. Persistent atrial fibrillation - plan for TEE/DCCV today - continue carvedilol and tikosyn  2. Acute CHF - LVEF newly reduced to 30-35% - ?tachy-mediated. Continues to diurese on po  lasix. On carvedilol and tikosyn. Was on telmisartan 80 mg QHS - holding now, BP normal. Would probably switch to [facility] prior to d/c. Wean down oxygen.  3 History of bicuspid valve with aortic insufficiency and stenosis- - Appears to have moderate AS with mild AI on echo  4. HTN - BP stable.   5. Hypokalemia - 3.3 today - repleting   For questions or updates, please contact [place] Please consult [email or web address] for contact info under Cardiology/STEMI.   Pixie Casino, MD, [facility], [place] Director of the [facility] &  Cardiovascular [facility] Clinic Diplomate of the American Board of Clinical Lipidology  Attending Cardiologist  Direct Dial: [phone]  Fax: [phone]  Website:  [email or web address]  Pixie Casino, MD  08/14/2017, 8:44 AM

## 2017-08-14 NOTE — Anesthesia Procedure Notes (Signed)
Procedure Name: MAC Date/Time: 08/14/2017 12:44 PM Performed by: Jenne Campus, CRNA Pre-anesthesia Checklist: Patient identified, Emergency Drugs available, Suction available and Patient being monitored Oxygen Delivery Method: Nasal cannula

## 2017-08-14 NOTE — Anesthesia Preprocedure Evaluation (Addendum)
Anesthesia Evaluation  Patient identified by MRN, date of birth, ID band Patient awake    Reviewed: Allergy & Precautions, NPO status , Patient's Chart, lab work & pertinent test results  Airway Mallampati: II  TM Distance: >3 FB Neck ROM: Full    Dental  (+) Dental Advisory Given   Pulmonary pneumonia, former smoker,    breath sounds clear to auscultation       Cardiovascular hypertension, Pt. on medications and Pt. on home beta blockers +CHF  + dysrhythmias + Valvular Problems/Murmurs AS  Rhythm:Regular Rate:Normal  08/13/17: Study Conclusions - Left ventricle: The cavity size was normal. Wall thickness was  increased in a pattern of moderate LVH. Systolic function was  moderately to severely reduced. The estimated ejection fraction  was in the range of 30% to 35%. - Aortic valve: AV is thickened, calcified with restricted motion.  Peak and mean gradients through the valve are 16 and 11 mm Hg  respectively. Dimensionless index is 0.48. 2 D imaging suggests  moderate AS. There was mild regurgitation. - Left atrium: The atrium was severely dilated. - Right ventricle: The cavity size was mildly dilated. Systolic  function was mildly to moderately reduced. - Pulmonary arteries: PA peak pressure: 46 mm Hg (S).   Neuro/Psych negative neurological ROS     GI/Hepatic Neg liver ROS, GERD  ,  Endo/Other  negative endocrine ROS  Renal/GU negative Renal ROS     Musculoskeletal  (+) Arthritis ,   Abdominal   Peds  Hematology  (+) anemia ,   Anesthesia Other Findings   Reproductive/Obstetrics                            Lab Results  Component Value Date   WBC 8.3 08/14/2017   HGB 14.6 08/14/2017   HCT 41.5 08/14/2017   MCV 99.5 08/14/2017   PLT 193 08/14/2017   Lab Results  Component Value Date   CREATININE 0.93 08/14/2017   BUN 16 08/14/2017   NA 129 (L) 08/14/2017   K 3.3 (L) 08/14/2017   CL  91 (L) 08/14/2017   CO2 25 08/14/2017    Anesthesia Physical Anesthesia Plan  ASA: III  Anesthesia Plan: General   Post-op Pain Management:    Induction: Intravenous  PONV Risk Score and Plan: 2 and Propofol infusion, Ondansetron and Treatment may vary due to age or medical condition  Airway Management Planned: Mask, Natural Airway, Nasal Cannula and Simple Face Mask  Additional Equipment:   Intra-op Plan:   Post-operative Plan:   Informed Consent: I have reviewed the patients History and Physical, chart, labs and discussed the procedure including the risks, benefits and alternatives for the proposed anesthesia with the patient or authorized representative who has indicated his/her understanding and acceptance.     Plan Discussed with: CRNA  Anesthesia Plan Comments:        Anesthesia Quick Evaluation

## 2017-08-14 NOTE — Progress Notes (Addendum)
CCM reports patient is in and out of afib with rates up to 140"s.  Text page Cardiologist on call, Glenford Peers. Patient is asymptomatic at this time and resting comforting. Will continue to monitor.

## 2017-08-14 NOTE — Anesthesia Postprocedure Evaluation (Signed)
Anesthesia Post Note  Patient: Travis [name]  Procedure(s) Performed: TRANSESOPHAGEAL ECHOCARDIOGRAM (TEE) (N/A ) CARDIOVERSION (N/A )     Patient location during evaluation: PACU Anesthesia Type: General Level of consciousness: awake and alert Pain management: pain level controlled Vital Signs Assessment: post-procedure vital signs reviewed and stable Respiratory status: spontaneous breathing, nonlabored ventilation, respiratory function stable and patient connected to nasal cannula oxygen Cardiovascular status: blood pressure returned to baseline and stable Postop Assessment: no apparent nausea or vomiting Anesthetic complications: no    Last Vitals:  Vitals:   08/14/17 1324 08/14/17 1330  BP: (!) 88/60 90/64  Pulse: 71 68  Resp: 18 16  Temp: 36.7 C   SpO2: 94% 93%    Last Pain:  Vitals:   08/14/17 1330  TempSrc:   PainSc: 0-No pain                 Tiajuana Amass

## 2017-08-14 NOTE — Interval H&P Note (Signed)
History and Physical Interval Note:  08/14/2017 12:37 PM  Travis [name]  has presented today for surgery, with the diagnosis of a fib  The various methods of treatment have been discussed with the patient and family. After consideration of risks, benefits and other options for treatment, the patient has consented to  Procedure(s): TRANSESOPHAGEAL ECHOCARDIOGRAM (TEE) (N/A) CARDIOVERSION (N/A) as a surgical intervention .  The patient's history has been reviewed, patient examined, no change in status, stable for surgery.  I have reviewed the patient's chart and labs.  Questions were answered to the patient's satisfaction.     Jenkins Rouge

## 2017-08-14 NOTE — Care Management Note (Signed)
Case Management Note  Patient Details  Name: Travis [name] MRN: [id number] Date of Birth: 03/17/1941  Subjective/Objective:   From home alone, presents with  Afib, chf, htn, hypokalemia, for Cardioversion today. He is ambulatory, he still works and drive will not qualify for [facility] services.                 Action/Plan: DC home when medically ready.  Expected Discharge Date:  08/15/17               Expected Discharge Plan:  [place]  In-House Referral:     Discharge planning Services  CM Consult  Post Acute Care Choice:    Choice offered to:     DME Arranged:    DME Agency:     HH Arranged:    [place] Agency:     Status of Service:  Completed, signed off  If discussed at [employer], dates discussed:    Additional Comments:  Zenon Mayo, RN 08/14/2017, 2:15 PM

## 2017-08-15 ENCOUNTER — Other Ambulatory Visit: Payer: Self-pay | Admitting: Internal Medicine

## 2017-08-15 LAB — GLUCOSE, CAPILLARY: Glucose-Capillary: 135 mg/dL — ABNORMAL HIGH (ref 65–99)

## 2017-08-15 MED ORDER — CARVEDILOL 25 MG PO TABS
25.0000 mg | ORAL_TABLET | Freq: Two times a day (BID) | ORAL | Status: DC
  Administered 2017-08-15 – 2017-08-16 (×2): 25 mg via ORAL
  Filled 2017-08-15 (×2): qty 1

## 2017-08-15 MED ORDER — POTASSIUM CHLORIDE CRYS ER 20 MEQ PO TBCR
40.0000 meq | EXTENDED_RELEASE_TABLET | Freq: Once | ORAL | Status: AC
  Administered 2017-08-15: 40 meq via ORAL
  Filled 2017-08-15: qty 2

## 2017-08-15 NOTE — Progress Notes (Addendum)
Progress Note  Patient Name: Travis [name] Date of Encounter: 08/15/2017  Primary Cardiologist: Virl Axe, MD   Subjective   Intermittent palpitations.  Again went into A. fib RVR.  Inpatient Medications    Scheduled Meds: . [START ON 08/16/2017] atorvastatin  40 mg Oral Weekly  . carvedilol  12.5 mg Oral BID WC  . dofetilide  500 mcg Oral BID  . furosemide  40 mg Oral Daily  . omega-3 acid ethyl esters  1,000 mg Oral Daily  . pantoprazole  40 mg Oral Daily  . rivaroxaban  20 mg Oral Q supper  . sodium chloride flush  3 mL Intravenous Q12H   Continuous Infusions: . sodium chloride     PRN Meds: sodium chloride, acetaminophen, ALPRAZolam, ondansetron (ZOFRAN) IV, sodium chloride flush, temazepam   Vital Signs    Vitals:   08/14/17 1606 08/14/17 1820 08/15/17 0000 08/15/17 0758  BP: 110/71 120/78 99/64 119/83  Pulse: 73 (!) 104 (!) 108 78  Resp: 18  16 14   Temp: (!) 97.5 F (36.4 C)  (!) 97.4 F (36.3 C) (!) 96.5 F (35.8 C)  TempSrc: Oral  Oral Axillary  SpO2: 93%  90% 96%  Weight:      Height:        Intake/Output Summary (Last 24 hours) at 08/15/2017 1200 Last data filed at 08/15/2017 0758 Gross per 24 hour  Intake 440 ml  Output 300 ml  Net 140 ml   Filed Weights   08/13/17 0620 08/13/17 1900 08/14/17 0500  Weight: 79.4 kg (175 lb) 79.5 kg (175 lb 4.3 oz) 79.3 kg (174 lb 13.2 oz)    Telemetry    Atrial fibrillation at rate of 100 with intermittent elevation to 120- Personally Reviewed  ECG    N/A  Physical Exam   GEN: No acute distress.   Neck: No JVD Cardiac: Irregularly irregular, systolic murmurs, rubs, or gallops.  Respiratory: Clear to auscultation bilaterally. GI: Soft, nontender, non-distended  MS: No edema; No deformity. Neuro:  Nonfocal  Psych: Normal affect   Labs    Chemistry Recent Labs  Lab 08/12/17 1528 08/12/17 1612 08/13/17 0343 08/14/17 0233  NA 127* 128* 129* 129*  K 4.6 4.6 3.7 3.3*  CL 96* 95* 95* 91*    CO2 20*  --  23 25  GLUCOSE 146* 146* 113* 115*  BUN 20 23* 17 16  CREATININE 1.04 1.00 0.88 0.93  CALCIUM 8.5*  --  8.3* 8.6*  GFRNONAA >60  --  >60 >60  GFRAA >60  --  >60 >60  ANIONGAP 11  --  11 13     Hematology Recent Labs  Lab 08/12/17 1528 08/12/17 1612 08/13/17 0343 08/14/17 0233  WBC 10.3  --  8.4 8.3  RBC 4.27  --  3.94* 4.17*  HGB 15.1 15.6 13.4 14.6  HCT 42.5 46.0 38.7* 41.5  MCV 99.5  --  98.2 99.5  MCH 35.4*  --  34.0 35.0*  MCHC 35.5  --  34.6 35.2  RDW 13.4  --  13.3 13.5  PLT 189  --  190 193    Cardiac EnzymesNo results for input(s): TROPONINI in the last 168 hours.  Recent Labs  Lab 08/12/17 1609  TROPIPOC 0.01     BNP Recent Labs  Lab 08/12/17 1531  BNP 649.3*     DDimer No results for input(s): DDIMER in the last 168 hours.   Radiology    No results found.  Cardiac Studies  TTE: 08/13/17 Study Conclusions  - Left ventricle: The cavity size was normal. Wall thickness was   increased in a pattern of moderate LVH. Systolic function was   moderately to severely reduced. The estimated ejection fraction   was in the range of 30% to 35%. - Aortic valve: AV is thickened, calcified with restricted motion.   Peak and mean gradients through the valve are 16 and 11 mm Hg   respectively. Dimensionless index is 0.48. 2 D imaging suggests   moderate AS. There was mild regurgitation. - Left atrium: The atrium was severely dilated. - Right ventricle: The cavity size was mildly dilated. Systolic   function was mildly to moderately reduced. - Pulmonary arteries: PA peak pressure: 46 mm Hg (S).  TEE/DCCV 08/14/17 EF 30% diffuse hypokinesis No LAA thrombus Trivial MR Moderate AS with mild MR Mild RV enlargement Severe LAE No effusion   DCC x 1 150J converted from afib rate 120 to SR/SB rates 55 BP low at first responded to neo/ephedrine per anethesia  No immediate neurologic sequelae  Jenkins Rouge    Patient Profile         Travis [name] a 77 y.o.malewith a history of tachycardia-mediated cardiomyopathyresolved on echo 2014, atrial fibrillation, bicuspid aortic valve, HTN, GERD, and sinus bradycardia (when he is in sinus rhythm)who recently treated with abx for bronchitis presented for dyspnea as well as orthopnea and found to have afib RVR and acute pulmonary edema due to acute CHF.   Assessment & Plan    1. Persistent atrial fibrillation - Patient was symptomatic due to elevated rate leading to acute CHF.  Rate improved on increased Coreg.  He had successful cardioversion.  However, again went into atrial fibrillation with intermittent rapid ventricular rate.  He has failed Tikosyn. -Will increase Coreg further to 25 mg twice daily.  ?  Rhythm versus rate control strategy. -Change Tikosyn to another antiarrhythmic regimen versus atrial fibrillation ablation. -The patient prefers rate control with diuretics.  He wants to discuss with [name] as outpatient. -MD to review.  2. Acute systolic CHF - Echo shows LVEF 30-35%, bicuspid aortic valve and probably moderate AS.  Questionable tachycardia mediated cardiomyopathy. -Patient is euvolemic on exam.  Dyspnea has resolved.  Continue Lasix 40 mg daily.  He is hypokalemic.  Will supplement.  MD to review daily Lasix dose.   3.  Valvular heart disease -As noted on echocardiogram.   For questions or updates, please contact [place] Please consult [email or web address] for contact info under Cardiology/STEMI.      Mahalia Longest [place], [place]  08/15/2017, 12:00 PM     Attending note:  Patient seen and examined. I reviewed the chart and recent course. He is back in atrial fibrillation and Coreg has been increased to address increased heart rate. He remains on Tikosyn and Xarelto otherwise. LVEF is approximately 30-35% and he has moderate aortic stenosis as well. Left atrium is significantly dilated.   He appears comfortable on examination. Heart rate  90s at rest with BP 119/83. Lungs are clear and cardiac exam with irregularly irregular rhythm, 2/6 systolic murmur.  Lab work shows potassium 3.3, creatinine 0.93, Hgb 14.6, and platelets 193.  Plan to watch today on higher dose Coreg, observe heart rate control with activity. Will not attempt DCCV again right now. He wants to go home soon - will likely plan to focus on heart rate control strategy in the short term and continue Tikosyn and Xarelto with further discussion per [name]  regarding next step. Amiodarone would really be only other antiarrhythmic option to consider. Doubt that he would be a good candidate for ablation with severe left atrial enlargement.  Satira Sark, M.D., F.A.C.C.

## 2017-08-15 NOTE — Progress Notes (Signed)
Physical Therapy Treatment Patient Details Name: Travis [name] MRN: [id number] DOB: [birth year] Today's Date: 08/15/2017    History of Present Illness Pt is a 77 y/o male admitted secondary to worsening SOB. Found to be in a fib and have CHF as well. DG of chest showed cardiomegaly and prominent vascular congestions and patchy consolidation of L lower lobe. PMH includes HTN, a fib, CHF, tachycardia-mediated cardiomyopathy, and bilat TKA.     PT Comments    Pt amb well with steady gait and able to negotiate a flight of stairs without problems. HR high 90's/low 100's at rest with HR incr to 130's with stairs before returning to 100's at end of amb. Pt to continue to amb in halls on his own. No further PT recommended.   Follow Up Recommendations  No PT follow up     Equipment Recommendations  None recommended by PT    Recommendations for Other Services       Precautions / Restrictions Precautions Precautions: Other (comment) Precaution Comments: Watch HR  Restrictions Weight Bearing Restrictions: No    Mobility  Bed Mobility               General bed mobility comments: Pt up in chair  Transfers Overall transfer level: Independent Equipment used: None Transfers: Sit to/from Stand Sit to Stand: Independent         General transfer comment: Performs easily.  Ambulation/Gait Ambulation/Gait assistance: Independent Ambulation Distance (Feet): 350 Feet Assistive device: None Gait Pattern/deviations: WFL(Within Functional Limits)   Gait velocity interpretation: at or above normal speed for age/gender General Gait Details: Pt with steady gait. Pt with HR to 130's with stairs and back to 100's after finishing amb   Stairs Stairs: Yes   Stair Management: One rail Right;Alternating pattern;Forwards Number of Stairs: 12 General stair comments: Able to perform without problems.  Wheelchair Mobility    Modified Rankin (Stroke Patients Only)       Balance  Overall balance assessment: Independent                             High Level Balance Comments: Pt able to perform turns and changes in direction without problems.            Cognition Arousal/Alertness: Awake/alert Behavior During Therapy: WFL for tasks assessed/performed Overall Cognitive Status: Within Functional Limits for tasks assessed                                        Exercises      General Comments        Pertinent Vitals/Pain Pain Assessment: No/denies pain    Home Living                      Prior Function            PT Goals (current goals can now be found in the care plan section) Progress towards PT goals: Goals met/education completed, patient discharged from PT    Frequency           PT Plan Other (comment)    Co-evaluation              AM-PAC PT "6 Clicks" Daily Activity  Outcome Measure  Difficulty turning over in bed (including adjusting bedclothes, sheets and blankets)?: None Difficulty moving from lying on back  to sitting on the side of the bed? : None Difficulty sitting down on and standing up from a chair with arms (e.g., wheelchair, bedside commode, etc,.)?: None Help needed moving to and from a bed to chair (including a wheelchair)?: None Help needed walking in hospital room?: None Help needed climbing 3-5 steps with a railing? : None 6 Click Score: 24    End of Session   Activity Tolerance: Patient tolerated treatment well Patient left: in chair Nurse Communication: Mobility status(elevated HR ) PT Visit Diagnosis: Unsteadiness on feet (R26.81)     Time: 4503-8882 PT Time Calculation (min) (ACUTE ONLY): 12 min  Charges:  $Gait Training: 8-22 mins                    G Codes:       [facility] PT [place] 08/15/2017, 12:03 PM

## 2017-08-16 ENCOUNTER — Other Ambulatory Visit: Payer: Self-pay | Admitting: Physician Assistant

## 2017-08-16 ENCOUNTER — Other Ambulatory Visit: Payer: Self-pay

## 2017-08-16 DIAGNOSIS — N179 Acute kidney failure, unspecified: Secondary | ICD-10-CM

## 2017-08-16 LAB — BASIC METABOLIC PANEL
Anion gap: 11 (ref 5–15)
BUN: 27 mg/dL — AB (ref 6–20)
CALCIUM: 8.9 mg/dL (ref 8.9–10.3)
CO2: 25 mmol/L (ref 22–32)
CREATININE: 1.25 mg/dL — AB (ref 0.61–1.24)
Chloride: 97 mmol/L — ABNORMAL LOW (ref 101–111)
GFR, EST NON AFRICAN AMERICAN: 54 mL/min — AB (ref >60)
Glucose, Bld: 108 mg/dL — ABNORMAL HIGH (ref 65–99)
Potassium: 4.4 mmol/L (ref 3.5–5.1)
SODIUM: 133 mmol/L — AB (ref 135–145)

## 2017-08-16 LAB — CBC
HCT: 43.6 % (ref 39.0–52.0)
Hemoglobin: 14.9 g/dL (ref 13.0–17.0)
MCH: 34.6 pg — ABNORMAL HIGH (ref 26.0–34.0)
MCHC: 34.2 g/dL (ref 30.0–36.0)
MCV: 101.2 fL — AB (ref 78.0–100.0)
PLATELETS: 213 10*3/uL (ref 150–400)
RBC: 4.31 MIL/uL (ref 4.22–5.81)
RDW: 13.4 % (ref 11.5–15.5)
WBC: 7.8 10*3/uL (ref 4.0–10.5)

## 2017-08-16 MED ORDER — POTASSIUM CHLORIDE ER 10 MEQ PO TBCR: 10.0000 meq | EXTENDED_RELEASE_TABLET | Freq: Every day | ORAL | 3 refills | Status: DC

## 2017-08-16 MED ORDER — FUROSEMIDE 40 MG PO TABS: 40.0000 mg | ORAL_TABLET | Freq: Every day | ORAL | 3 refills | Status: DC

## 2017-08-16 MED ORDER — CARVEDILOL 25 MG PO TABS: 25.0000 mg | ORAL_TABLET | Freq: Two times a day (BID) | ORAL | 6 refills | Status: DC

## 2017-08-16 NOTE — Discharge Summary (Addendum)
Discharge Summary    Patient ID: Travis [name],  MRN: [id number], DOB/AGE: 1941/03/07 77 y.o.  Admit date: 08/12/2017 Discharge date: 08/16/2017  Primary Care Provider: [name] Primary Cardiologist: [name]   Discharge Diagnoses    Principal Problem:   Atrial fibrillation with RVR [facility]) Active Problems:   Acute respiratory failure with hypoxia (Echo)   Acute on chronic combined systolic and diastolic CHF (congestive heart failure) (HCC)   GERD (gastroesophageal reflux disease)   Aortic valve, bicuspid   Acute hyponatremia   Community acquired pneumonia of left lower lobe of lung ([place])  Allergies No Known Allergies  Diagnostic Studies/Procedures    TTE: 08/13/17 Study Conclusions  - Left ventricle: The cavity size was normal. Wall thickness was increased in a pattern of moderate LVH. Systolic function was moderately to severely reduced. The estimated ejection fraction was in the range of 30% to 35%. - Aortic valve: AV is thickened, calcified with restricted motion. Peak and mean gradients through the valve are 16 and 11 mm Hg respectively. Dimensionless index is 0.48. 2 D imaging suggests moderate AS. There was mild regurgitation. - Left atrium: The atrium was severely dilated. - Right ventricle: The cavity size was mildly dilated. Systolic function was mildly to moderately reduced. - Pulmonary arteries: PA peak pressure: 46 mm Hg (S).  TEE/DCCV 08/14/17 EF 30% diffuse hypokinesis No LAA thrombus Trivial MR Moderate AS with mild MR Mild RV enlargement Severe LAE No effusion   DCC x 1 150J converted from afib rate 120 to SR/SB rates 55 BP low at first responded to neo/ephedrine per anethesia  No immediate neurologic sequelae  Jenkins Rouge      History of Present Illness     Travis Gibbons Meltonis a 77 y.o.malewith a history of tachycardia-mediated cardiomyopathyresolved on echo 2014, atrial fibrillation, bicuspid aortic  valve, HTN, GERD, and sinus bradycardia (when he is in sinus rhythm)who recently treated with abx for bronchitis presented fordyspnea as well as orthopneaandfound to have afib RVR and acute pulmonary edema due to acute CHF.  Hospital Course     Consultants: None  1. Persistent atrial fibrillation -Patient was symptomatic due to elevated rate leading to acute CHF.  Rate improved on increased Coreg to 12.5mg .  He had successful cardioversion.  However, again went into atrial fibrillation with intermittent rapid ventricular rate.  He has failed Tikosyn. -Due to recurrent elevated rate, increased Coreg further to 25 mg twice daily. His rate improved to 90s with intermittent 110s with activity.   Rhythm versus rate control strategy. - Amiodarone would really be only other antiarrhythmic option to consider. Doubt that he would be a good candidate for ablation with severe left atrial enlargement. - He wants to discuss further with [name].   2. Acute systolic CHF - Echo shows LVEF 30-35%, bicuspid aortic valve and probably moderate AS.  Questionable tachycardia mediated cardiomyopathy. -Dyspnea has resolved on diuretics. Net I & O negative 3L. Weight loss 2 LB (175-->173lb).  - Scr of 1.25mg   Today on on Lasix 40 mg daily.  He is euvolemic. Will resume same with supplemental Kdur.  Recheck BMET in few days.   3.  Valvular heart disease -As noted on echocardiogram.  He has follow up with [name] 10/05/17. However he needs to be seen in 7-10 days. He does not prefers afib clinic options. I have staff message to move up his appointment (done). He will also give call to clinic tomorrow.   The patient  has been seen by Dr. Johnsie Cancel today and deemed ready for discharge home. All follow-up appointments have been scheduled. Discharge medications are listed below.    Discharge Vitals Blood pressure 102/65, pulse 63, temperature (!) 97.5 F (36.4 C), temperature source Oral, resp. rate 14, height 5'  8" (1.727 m), weight 173 lb 15.1 oz (78.9 kg), SpO2 96 %.  Filed Weights   08/13/17 1900 08/14/17 0500 08/16/17 0700  Weight: 175 lb 4.3 oz (79.5 kg) 174 lb 13.2 oz (79.3 kg) 173 lb 15.1 oz (78.9 kg)    Labs & Radiologic Studies     CBC Recent Labs    08/14/17 0233 08/16/17 0247  WBC 8.3 7.8  HGB 14.6 14.9  HCT 41.5 43.6  MCV 99.5 101.2*  PLT 193 366   Basic Metabolic Panel Recent Labs    08/14/17 0233 08/16/17 0247  NA 129* 133*  K 3.3* 4.4  CL 91* 97*  CO2 25 25  GLUCOSE 115* 108*  BUN 16 27*  CREATININE 0.93 1.25*  CALCIUM 8.6* 8.9  MG 2.1  --    Fasting Lipid Panel Recent Labs    08/14/17 0233  CHOL 133  HDL 57  LDLCALC 65  TRIG 54  CHOLHDL 2.3   Thyroid Function Tests No results for input(s): TSH, T4TOTAL, T3FREE, THYROIDAB in the last 72 hours.  Invalid input(s): FREET3  Dg Chest Port 1 View  Result Date: 08/12/2017 CLINICAL DATA:  Shortness of breath. EXAM: PORTABLE CHEST 1 VIEW COMPARISON:  Chest x-ray dated January 27, 2014. FINDINGS: Cardiomegaly. Pulmonary vascular congestion with diffuse interstitial thickening. Patchy consolidation in the left lower lobe. Streaky opacities at the right lung base likely reflect atelectasis. Probable small bilateral pleural effusions. No pneumothorax. No acute osseous abnormality. IMPRESSION: 1. Cardiomegaly with prominent vascular congestion, interstitial edema, and small bilateral pleural effusions. 2. Patchy consolidation in the left lower lobe could reflect atelectasis or pneumonia. Electronically Signed   By: Titus Dubin M.D.   On: 08/12/2017 15:47    Disposition   Pt is being discharged home today in good condition.  Follow-up Plans & Appointments    Follow-up Information    [facility]. Go on 08/19/2017.   Specialty:  Cardiology Why:  For kidney function check between 7:30am to 5pm Contact information: [address], [place] [phone]         Discharge Instructions    Diet - low sodium heart healthy   Complete by:  As directed    Increase activity slowly   Complete by:  As directed       Discharge Medications   Allergies as of 08/16/2017   No Known Allergies     Medication List    STOP taking these medications   amLODipine 10 MG tablet Commonly known as:  NORVASC   amoxicillin 500 MG capsule Commonly known as:  AMOXIL   ciprofloxacin 500 MG tablet Commonly known as:  CIPRO   telmisartan 80 MG tablet Commonly known as:  MICARDIS     TAKE these medications   acetaminophen 500 MG tablet Commonly known as:  TYLENOL Take 1,000 mg by mouth every 6 (six) hours as needed for mild pain or moderate pain. Reported on 08/01/2015   ALPRAZolam 0.5 MG tablet Commonly known as:  XANAX Take 1 tablet (0.5 mg total) by mouth 2 (two) times daily as needed for anxiety (separate by 8 hours from temazepam).   atorvastatin 40 MG tablet Commonly  known as:  LIPITOR Take 1 tablet (40 mg total) by mouth once a week. What changed:  when to take this   atorvastatin 40 MG tablet Commonly known as:  LIPITOR TAKE 1 TABLET(40 MG) BY MOUTH 1 TIME A WEEK What changed:  Another medication with the same name was changed. Make sure you understand how and when to take each.   b complex vitamins tablet Take 1 tablet by mouth daily.   carvedilol 25 MG tablet Commonly known as:  COREG Take 1 tablet (25 mg total) by mouth 2 (two) times daily with a meal. What changed:    medication strength  See the new instructions.   dofetilide 500 MCG capsule Commonly known as:  TIKOSYN TAKE ONE CAPSULE BY MOUTH EVERY 12 HOURS What changed:  See the new instructions.   finasteride 1 MG tablet Commonly known as:  PROPECIA TAKE 1 TABLET(1 MG) BY MOUTH DAILY What changed:  See the new instructions.   fish oil-omega-3 fatty acids 1000 MG capsule Take 1,000 mg by mouth daily.   furosemide 40 MG tablet Commonly  known as:  LASIX Take 1 tablet (40 mg total) by mouth daily. Start taking on:  08/17/2017   hydroxypropyl methylcellulose / hypromellose 2.5 % ophthalmic solution Commonly known as:  ISOPTO TEARS / GONIOVISC Place 2 drops into both eyes 3 (three) times daily as needed for dry eyes.   ICAPS AREDS 2 Caps Take 1 capsule by mouth 2 (two) times daily.   meloxicam 15 MG tablet Commonly known as:  MOBIC TAKE 1 TABLET BY MOUTH DAILY What changed:    how much to take  how to take this  when to take this   omeprazole 20 MG capsule Commonly known as:  PRILOSEC TAKE 1 CAPSULE BY MOUTH DAILY What changed:    how much to take  how to take this  when to take this   potassium chloride 10 MEQ tablet Commonly known as:  K-DUR Take 1 tablet (10 mEq total) by mouth daily.   RA FLAX SEED OIL 1000 PO Take 1,000 mg by mouth 3 (three) times daily.   rivaroxaban 20 MG Tabs tablet Commonly known as:  XARELTO Take 1 tablet (20 mg total) by mouth daily with supper.   temazepam 15 MG capsule Commonly known as:  RESTORIL TAKE ONE CAPSULE BY MOUTH AT BEDTIME AS NEEDED What changed:    how much to take  how to take this  when to take this   ZOFRAN 4 MG tablet Generic drug:  ondansetron Take 4 mg by mouth 2 (two) times daily.         Outstanding Labs/Studies   BMET in 3 days  Duration of Discharge Encounter   Greater than 30 minutes including physician time.  Signed, Crista Luria Ijanae Macapagal PA-C  08/16/2017, 11:36 AM

## 2017-08-16 NOTE — Progress Notes (Signed)
Patient discharged to home, all discharge instructions reviewed, patient has no questions or concerns. VSS, patient has all belongings.

## 2017-08-16 NOTE — Progress Notes (Addendum)
Progress Note  Patient Name: Travis [name] Date of Encounter: 08/16/2017  Primary Cardiologist: Virl Axe, MD   Subjective   Feeling well. No chest pain, sob or palpitations.   Inpatient Medications    Scheduled Meds: . atorvastatin  40 mg Oral Weekly  . carvedilol  25 mg Oral BID WC  . dofetilide  500 mcg Oral BID  . furosemide  40 mg Oral Daily  . omega-3 acid ethyl esters  1,000 mg Oral Daily  . pantoprazole  40 mg Oral Daily  . rivaroxaban  20 mg Oral Q supper  . sodium chloride flush  3 mL Intravenous Q12H   Continuous Infusions: . sodium chloride     PRN Meds: sodium chloride, acetaminophen, ALPRAZolam, ondansetron (ZOFRAN) IV, sodium chloride flush, temazepam   Vital Signs    Vitals:   08/15/17 2100 08/15/17 2150 08/16/17 0700 08/16/17 0849  BP: (!) 87/69 106/72  102/65  Pulse: 91   63  Resp:      Temp: (!) 97.5 F (36.4 C)     TempSrc: Oral     SpO2: 96%     Weight:   173 lb 15.1 oz (78.9 kg)   Height:       No intake or output data in the 24 hours ending 08/16/17 0946 Filed Weights   08/13/17 1900 08/14/17 0500 08/16/17 0700  Weight: 175 lb 4.3 oz (79.5 kg) 174 lb 13.2 oz (79.3 kg) 173 lb 15.1 oz (78.9 kg)    Telemetry   afib at 90s, intermittently in 120s - Personally Reviewed  ECG  N/A  Physical Exam   GEN: No acute distress.   Neck: No JVD Cardiac: IR IR, no murmurs, rubs, or gallops.  Respiratory: Clear to auscultation bilaterally. GI: Soft, nontender, non-distended  MS: No edema; No deformity. Neuro:  Nonfocal  Psych: Normal affect   Labs    Chemistry Recent Labs  Lab 08/13/17 0343 08/14/17 0233 08/16/17 0247  NA 129* 129* 133*  K 3.7 3.3* 4.4  CL 95* 91* 97*  CO2 23 25 25   GLUCOSE 113* 115* 108*  BUN 17 16 27*  CREATININE 0.88 0.93 1.25*  CALCIUM 8.3* 8.6* 8.9  GFRNONAA >60 >60 54*  GFRAA >60 >60 >60  ANIONGAP 11 13 11      Hematology Recent Labs  Lab 08/13/17 0343 08/14/17 0233 08/16/17 0247  WBC 8.4 8.3  7.8  RBC 3.94* 4.17* 4.31  HGB 13.4 14.6 14.9  HCT 38.7* 41.5 43.6  MCV 98.2 99.5 101.2*  MCH 34.0 35.0* 34.6*  MCHC 34.6 35.2 34.2  RDW 13.3 13.5 13.4  PLT 190 193 213    Cardiac EnzymesNo results for input(s): TROPONINI in the last 168 hours.  Recent Labs  Lab 08/12/17 1609  TROPIPOC 0.01     BNP Recent Labs  Lab 08/12/17 1531  BNP 649.3*     DDimer No results for input(s): DDIMER in the last 168 hours.   Radiology    No results found.  Cardiac Studies  TTE: 08/13/17 Study Conclusions  - Left ventricle: The cavity size was normal. Wall thickness was increased in a pattern of moderate LVH. Systolic function was moderately to severely reduced. The estimated ejection fraction was in the range of 30% to 35%. - Aortic valve: AV is thickened, calcified with restricted motion. Peak and mean gradients through the valve are 16 and 11 mm Hg respectively. Dimensionless index is 0.48. 2 D imaging suggests moderate AS. There was mild regurgitation. - Left  atrium: The atrium was severely dilated. - Right ventricle: The cavity size was mildly dilated. Systolic function was mildly to moderately reduced. - Pulmonary arteries: PA peak pressure: 46 mm Hg (S).  TEE/DCCV 08/14/17 EF 30% diffuse hypokinesis No LAA thrombus Trivial MR Moderate AS with mild MR Mild RV enlargement Severe LAE No effusion   DCC x 1 150J converted from afib rate 120 to SR/SB rates 55 BP low at first responded to neo/ephedrine per anethesia  No immediate neurologic sequelae  Jenkins Rouge    Patient Profile      Suliman Termini Meltonis a 77 y.o.malewith a history of tachycardia-mediated cardiomyopathyresolved on echo 2014, atrial fibrillation, bicuspid aortic valve, HTN, GERD, and sinus bradycardia (when he is in sinus rhythm)who recently treated with abx for bronchitis presented fordyspnea as well as orthopnea and found to have afib RVR and acute pulmonary edema due to  acute CHF.  Assessment & Plan    1. Persistent atrial fibrillation -failed TEE/DCCV. HR minimally improved on higher dose of coreg to 25mg  BID. He failed Tikosyn. He wants to discussed with [name] for next step. Amiodarone is only option for antiarrhtymiac, ? Ablation given severe left atrial enlargement.   2. Acute systolic CHF - Echo shows LVEF 30-35%, bicuspid aortic valve and probably moderate AS.  Questionable tachycardia mediated cardiomyopathy. -Patient is euvolemic on exam.  Dyspnea has resolved.  - On lasix po daily. SCr worsen to 1.25 today. Likely needs PRN or low dose. MD to decide.   3.  Valvular heart disease -As noted on echocardiogram.      For questions or updates, please contact [facility] Please consult [email or web address] for contact info under Cardiology/STEMI.      Jarrett Soho, PA  08/16/2017, 9:46 AM

## 2017-08-17 ENCOUNTER — Encounter (HOSPITAL_COMMUNITY): Payer: Self-pay | Admitting: Cardiovascular Disease

## 2017-08-17 NOTE — Telephone Encounter (Signed)
Outpatient Medication Detail    Disp Refills Start End   carvedilol (COREG) 25 MG tablet 60 tablet 6 08/16/2017    Sig - Route: Take 1 tablet (25 mg total) by mouth 2 (two) times daily with a meal. - Oral   Sent to pharmacy as: carvedilol (COREG) 25 MG tablet   E-Prescribing Status: Receipt confirmed by pharmacy (08/16/2017 11:35 AM EDT)   Pharmacy   [id number] - [place], [place]

## 2017-08-19 ENCOUNTER — Encounter: Payer: Self-pay | Admitting: Internal Medicine

## 2017-08-19 ENCOUNTER — Ambulatory Visit (INDEPENDENT_AMBULATORY_CARE_PROVIDER_SITE_OTHER): Payer: Medicare Other | Admitting: Internal Medicine

## 2017-08-19 VITALS — BP 108/76 | HR 95 | Ht 68.0 in | Wt 181.0 lb

## 2017-08-19 DIAGNOSIS — I481 Persistent atrial fibrillation: Secondary | ICD-10-CM

## 2017-08-19 DIAGNOSIS — Z01812 Encounter for preprocedural laboratory examination: Secondary | ICD-10-CM

## 2017-08-19 DIAGNOSIS — I447 Left bundle-branch block, unspecified: Secondary | ICD-10-CM | POA: Diagnosis not present

## 2017-08-19 DIAGNOSIS — Q231 Congenital insufficiency of aortic valve: Secondary | ICD-10-CM | POA: Diagnosis not present

## 2017-08-19 DIAGNOSIS — I4819 Other persistent atrial fibrillation: Secondary | ICD-10-CM

## 2017-08-19 DIAGNOSIS — I429 Cardiomyopathy, unspecified: Secondary | ICD-10-CM | POA: Diagnosis not present

## 2017-08-19 MED ORDER — RANOLAZINE ER 500 MG PO TB12: 500.0000 mg | ORAL_TABLET | Freq: Two times a day (BID) | ORAL | 11 refills | Status: DC

## 2017-08-19 NOTE — Progress Notes (Signed)
Patient Care Team: [name], MD as PCP - General (Family Medicine) Deboraha Sprang, MD as PCP - Cardiology (Cardiology)   HPI  Travis [name] is a 77 y.o. male Seen in followup for paroxysmal atrial fibrillation and tachycardia-induced and resolved cardiomyopathy seen in followup today.   He ended up with congestive heart failure 7/14 with an ejection fraction of 35%. Propafenone was discontinued and dofetilide was initiated. Echocardiogram 10/14 demonstrated normalization of LV function.  The patient denies chest pain, shortness of breath, nocturnal dyspnea, orthopnea or peripheral edema.  There have been 3 interval episodes of atrial fibrillation for a day or so, each has been associated with antibiotics. There has been no lightheadedness or syncope. He is working out with a trainer 3 times a week  He also has a probable bicuspid aortic valve with mild stenosis;   DATE TEST EF   3/16 Echo   55  %   4/18 Echo   55-65 % Bicuspid AoV grad mean 15  3/19 Echo 30-35% AoV mean 11   He was hospitalized because of shortness of breath.  He is also noted exercise intolerance for the last number of weeks.  Efforts to cardiovert him was successful but he reverted rapidly to use atrial fibrillation.  Because of renal issues, his Micardis was stopped in the hospital.    Date Cr K Mg Hgb  3/18  0.79 4.3 2.0    3/19 1.25 4.4  14.9     Past Medical History:  Diagnosis Date  . Arthritis   . Bicuspid aortic valve   . Cardiomyopathy -resolved    tachycardia-induced, EF 55% June 2009  . CHF (congestive heart failure) ([place])    JULY 2014  . GERD (gastroesophageal reflux disease)   . History of cardioversion 12/16/2012  . History of stomach ulcers   . Hypertension   . Left bundle branch block   . Paroxysmal atrial fibrillation (HCC)   . Sinus bradycardia   . Status post clamping of cerebral aneurysm 80    Past Surgical History:  Procedure Laterality Date  . CARDIAC CATHETERIZATION     . CARDIOVERSION N/A 12/16/2012   Procedure: CARDIOVERSION;  Surgeon: Lelon Perla, MD;  Location: [facility] ENDOSCOPY;  Service: Cardiovascular;  Laterality: N/A;  . CARDIOVERSION N/A 08/14/2017   Procedure: CARDIOVERSION;  Surgeon: Josue Hector, MD;  Location: [facility] ENDOSCOPY;  Service: Cardiovascular;  Laterality: N/A;  . CATARACT EXTRACTION  05/2015   bilateral  . cerebral anuersym post clips    . fractured left arm    . HERNIA REPAIR     lft  . INGUINAL HERNIA REPAIR  07/02/2011   Procedure: LAPAROSCOPIC INGUINAL HERNIA;  Surgeon: Harl Bowie, MD;  Location: [facility];  Service: General;  Laterality: Left;  Laparoscopic left inguinal hernia repair and mesh  . left tendon repair     lft foot  . ROTATOR CUFF REPAIR     lf  . TEE WITHOUT CARDIOVERSION N/A 08/14/2017   Procedure: TRANSESOPHAGEAL ECHOCARDIOGRAM (TEE);  Surgeon: Josue Hector, MD;  Location: [facility] ENDOSCOPY;  Service: Cardiovascular;  Laterality: N/A;  . TONSILLECTOMY    . TOTAL KNEE ARTHROPLASTY Bilateral 02/06/2014   Procedure: TOTAL KNEE BILATERAL;  Surgeon: Mauri Pole, MD;  Location: [facility] ORS;  Service: Orthopedics;  Laterality: Bilateral;  . WRIST GANGLION EXCISION     lft    Current Outpatient Medications  Medication Sig Dispense Refill  . acetaminophen (TYLENOL) 500 MG tablet Take 1,000 mg  by mouth every 6 (six) hours as needed for mild pain or moderate pain. Reported on 08/01/2015    . ALPRAZolam (XANAX) 0.5 MG tablet Take 1 tablet (0.5 mg total) by mouth 2 (two) times daily as needed for anxiety (separate by 8 hours from temazepam). 5 tablet 0  . atorvastatin (LIPITOR) 40 MG tablet Take 1 tablet (40 mg total) by mouth once a week. (Patient taking differently: Take 40 mg by mouth every Sunday. ) 14 tablet 3  . atorvastatin (LIPITOR) 40 MG tablet TAKE 1 TABLET(40 MG) BY MOUTH 1 TIME A WEEK 14 tablet 2  . b complex vitamins tablet Take 1 tablet by mouth daily.     . carvedilol (COREG) 25 MG tablet Take 1 tablet (25 mg  total) by mouth 2 (two) times daily with a meal. 60 tablet 6  . dofetilide (TIKOSYN) 500 MCG capsule TAKE ONE CAPSULE BY MOUTH EVERY 12 HOURS (Patient taking differently: Take 500 mcg by mouth every twelve hours) 180 capsule 3  . finasteride (PROPECIA) 1 MG tablet TAKE 1 TABLET(1 MG) BY MOUTH DAILY (Patient taking differently: Take 1 mg by mouth once a day) 30 tablet 5  . fish oil-omega-3 fatty acids 1000 MG capsule Take 1,000 mg by mouth daily.     . Flaxseed, Linseed, (RA FLAX SEED OIL 1000 PO) Take 1,000 mg by mouth 3 (three) times daily.     . furosemide (LASIX) 40 MG tablet Take 1 tablet (40 mg total) by mouth daily. 30 tablet 3  . hydroxypropyl methylcellulose / hypromellose (ISOPTO TEARS / GONIOVISC) 2.5 % ophthalmic solution Place 2 drops into both eyes 3 (three) times daily as needed for dry eyes.    . meloxicam (MOBIC) 15 MG tablet TAKE 1 TABLET BY MOUTH DAILY (Patient taking differently: Take 15 mg by mouth once a day) 90 tablet 0  . Multiple Vitamins-Minerals (ICAPS AREDS 2) CAPS Take 1 capsule by mouth 2 (two) times daily.     Marland Kitchen omeprazole (PRILOSEC) 20 MG capsule TAKE 1 CAPSULE BY MOUTH DAILY (Patient taking differently: Take 20 mg by mouth once a day) 90 capsule 0  . potassium chloride (K-DUR) 10 MEQ tablet Take 1 tablet (10 mEq total) by mouth daily. 30 tablet 3  . rivaroxaban (XARELTO) 20 MG TABS tablet Take 1 tablet (20 mg total) by mouth daily with supper. 30 tablet 6  . temazepam (RESTORIL) 15 MG capsule TAKE ONE CAPSULE BY MOUTH AT BEDTIME AS NEEDED (Patient taking differently: Take 15 mg by mouth at bedtime as needed for sleep) 30 capsule 2  . ZOFRAN 4 MG tablet Take 4 mg by mouth 2 (two) times daily.      No current facility-administered medications for this visit.     No Known Allergies  Review of Systems negative except from HPI and PMH  Physical Exam BP 108/76   Pulse 95   Ht 5\' 8"  (1.727 m)   Wt 181 lb (82.1 kg)   SpO2 99%   BMI 27.52 kg/m  Well developed and  nourished in no acute distress HENT normal Neck supple with JVP-flat Carotids brisk and full without bruits Clear Irregularly irregular rate and rhythm with rapid ventricular response, no murmurs or gallops Abd-soft with active BS without hepatomegaly No Clubbing cyanosis edema Skin-warm and dry A & Oriented  Grossly normal sensory and motor function  ECG afib with LBBB  Assessment and  Plan  Atrial fibrillation- persistent  Cardiomyopathy-tachycardia mediated-recurrent    Bicuspid aortic valve-  Hypertension   Renal insufficiency  Left bundle branch block   The patient has persistent atrial fibrillation.  He is reverted rapidly following following his most recent cardioversion.  Unfortunately his episode of atrial fibrillation has been protracted and is associated with left ventricular dysfunction.  Hence, restoration of sinus rhythm becomes important.  We will initially undertake repeat cardioversion with adjunctive ranolazine.  If this is inadequate, we will stop the dofetilide and use amiodarone.  In either case, we will anticipate a referral to [name] for consideration of catheter ablation.  If he has improvement in renal function, will need to resume his ACE/ARB.  He is on a beta-blocker.  More than 50% of 40 min was spent in counseling related to the above

## 2017-08-19 NOTE — H&P (View-Only) (Signed)
Patient Care Team: [name], MD as PCP - General (Family Medicine) Deboraha Sprang, MD as PCP - Cardiology (Cardiology)   HPI  Travis [name] is a 77 y.o. male Seen in followup for paroxysmal atrial fibrillation and tachycardia-induced and resolved cardiomyopathy seen in followup today.   He ended up with congestive heart failure 7/14 with an ejection fraction of 35%. Propafenone was discontinued and dofetilide was initiated. Echocardiogram 10/14 demonstrated normalization of LV function.  The patient denies chest pain, shortness of breath, nocturnal dyspnea, orthopnea or peripheral edema.  There have been 3 interval episodes of atrial fibrillation for a day or so, each has been associated with antibiotics. There has been no lightheadedness or syncope. He is working out with a trainer 3 times a week  He also has a probable bicuspid aortic valve with mild stenosis;   DATE TEST EF   3/16 Echo   55  %   4/18 Echo   55-65 % Bicuspid AoV grad mean 15  3/19 Echo 30-35% AoV mean 11   He was hospitalized because of shortness of breath.  He is also noted exercise intolerance for the last number of weeks.  Efforts to cardiovert him was successful but he reverted rapidly to use atrial fibrillation.  Because of renal issues, his Micardis was stopped in the hospital.    Date Cr K Mg Hgb  3/18  0.79 4.3 2.0    3/19 1.25 4.4  14.9     Past Medical History:  Diagnosis Date  . Arthritis   . Bicuspid aortic valve   . Cardiomyopathy -resolved    tachycardia-induced, EF 55% June 2009  . CHF (congestive heart failure) ([place])    JULY 2014  . GERD (gastroesophageal reflux disease)   . History of cardioversion 12/16/2012  . History of stomach ulcers   . Hypertension   . Left bundle branch block   . Paroxysmal atrial fibrillation (HCC)   . Sinus bradycardia   . Status post clamping of cerebral aneurysm 80    Past Surgical History:  Procedure Laterality Date  . CARDIAC CATHETERIZATION     . CARDIOVERSION N/A 12/16/2012   Procedure: CARDIOVERSION;  Surgeon: Lelon Perla, MD;  Location: [facility] ENDOSCOPY;  Service: Cardiovascular;  Laterality: N/A;  . CARDIOVERSION N/A 08/14/2017   Procedure: CARDIOVERSION;  Surgeon: Josue Hector, MD;  Location: [facility] ENDOSCOPY;  Service: Cardiovascular;  Laterality: N/A;  . CATARACT EXTRACTION  05/2015   bilateral  . cerebral anuersym post clips    . fractured left arm    . HERNIA REPAIR     lft  . INGUINAL HERNIA REPAIR  07/02/2011   Procedure: LAPAROSCOPIC INGUINAL HERNIA;  Surgeon: Harl Bowie, MD;  Location: [place];  Service: General;  Laterality: Left;  Laparoscopic left inguinal hernia repair and mesh  . left tendon repair     lft foot  . ROTATOR CUFF REPAIR     lf  . TEE WITHOUT CARDIOVERSION N/A 08/14/2017   Procedure: TRANSESOPHAGEAL ECHOCARDIOGRAM (TEE);  Surgeon: Josue Hector, MD;  Location: [facility] ENDOSCOPY;  Service: Cardiovascular;  Laterality: N/A;  . TONSILLECTOMY    . TOTAL KNEE ARTHROPLASTY Bilateral 02/06/2014   Procedure: TOTAL KNEE BILATERAL;  Surgeon: Mauri Pole, MD;  Location: [facility] ORS;  Service: Orthopedics;  Laterality: Bilateral;  . WRIST GANGLION EXCISION     lft    Current Outpatient Medications  Medication Sig Dispense Refill  . acetaminophen (TYLENOL) 500 MG tablet Take 1,000 mg  by mouth every 6 (six) hours as needed for mild pain or moderate pain. Reported on 08/01/2015    . ALPRAZolam (XANAX) 0.5 MG tablet Take 1 tablet (0.5 mg total) by mouth 2 (two) times daily as needed for anxiety (separate by 8 hours from temazepam). 5 tablet 0  . atorvastatin (LIPITOR) 40 MG tablet Take 1 tablet (40 mg total) by mouth once a week. (Patient taking differently: Take 40 mg by mouth every Sunday. ) 14 tablet 3  . atorvastatin (LIPITOR) 40 MG tablet TAKE 1 TABLET(40 MG) BY MOUTH 1 TIME A WEEK 14 tablet 2  . b complex vitamins tablet Take 1 tablet by mouth daily.     . carvedilol (COREG) 25 MG tablet Take 1 tablet (25 mg  total) by mouth 2 (two) times daily with a meal. 60 tablet 6  . dofetilide (TIKOSYN) 500 MCG capsule TAKE ONE CAPSULE BY MOUTH EVERY 12 HOURS (Patient taking differently: Take 500 mcg by mouth every twelve hours) 180 capsule 3  . finasteride (PROPECIA) 1 MG tablet TAKE 1 TABLET(1 MG) BY MOUTH DAILY (Patient taking differently: Take 1 mg by mouth once a day) 30 tablet 5  . fish oil-omega-3 fatty acids 1000 MG capsule Take 1,000 mg by mouth daily.     . Flaxseed, Linseed, (RA FLAX SEED OIL 1000 PO) Take 1,000 mg by mouth 3 (three) times daily.     . furosemide (LASIX) 40 MG tablet Take 1 tablet (40 mg total) by mouth daily. 30 tablet 3  . hydroxypropyl methylcellulose / hypromellose (ISOPTO TEARS / GONIOVISC) 2.5 % ophthalmic solution Place 2 drops into both eyes 3 (three) times daily as needed for dry eyes.    . meloxicam (MOBIC) 15 MG tablet TAKE 1 TABLET BY MOUTH DAILY (Patient taking differently: Take 15 mg by mouth once a day) 90 tablet 0  . Multiple Vitamins-Minerals (ICAPS AREDS 2) CAPS Take 1 capsule by mouth 2 (two) times daily.     Marland Kitchen omeprazole (PRILOSEC) 20 MG capsule TAKE 1 CAPSULE BY MOUTH DAILY (Patient taking differently: Take 20 mg by mouth once a day) 90 capsule 0  . potassium chloride (K-DUR) 10 MEQ tablet Take 1 tablet (10 mEq total) by mouth daily. 30 tablet 3  . rivaroxaban (XARELTO) 20 MG TABS tablet Take 1 tablet (20 mg total) by mouth daily with supper. 30 tablet 6  . temazepam (RESTORIL) 15 MG capsule TAKE ONE CAPSULE BY MOUTH AT BEDTIME AS NEEDED (Patient taking differently: Take 15 mg by mouth at bedtime as needed for sleep) 30 capsule 2  . ZOFRAN 4 MG tablet Take 4 mg by mouth 2 (two) times daily.      No current facility-administered medications for this visit.     No Known Allergies  Review of Systems negative except from HPI and PMH  Physical Exam BP 108/76   Pulse 95   Ht 5\' 8"  (1.727 m)   Wt 181 lb (82.1 kg)   SpO2 99%   BMI 27.52 kg/m  Well developed and  nourished in no acute distress HENT normal Neck supple with JVP-flat Carotids brisk and full without bruits Clear Irregularly irregular rate and rhythm with rapid ventricular response, no murmurs or gallops Abd-soft with active BS without hepatomegaly No Clubbing cyanosis edema Skin-warm and dry A & Oriented  Grossly normal sensory and motor function  ECG afib with LBBB  Assessment and  Plan  Atrial fibrillation- persistent  Cardiomyopathy-tachycardia mediated-recurrent    Bicuspid aortic valve-  Hypertension   Renal insufficiency  Left bundle branch block   The patient has persistent atrial fibrillation.  He is reverted rapidly following following his most recent cardioversion.  Unfortunately his episode of atrial fibrillation has been protracted and is associated with left ventricular dysfunction.  Hence, restoration of sinus rhythm becomes important.  We will initially undertake repeat cardioversion with adjunctive ranolazine.  If this is inadequate, we will stop the dofetilide and use amiodarone.  In either case, we will anticipate a referral to [name] for consideration of catheter ablation.  If he has improvement in renal function, will need to resume his ACE/ARB.  He is on a beta-blocker.  More than 50% of 40 min was spent in counseling related to the above

## 2017-08-19 NOTE — Patient Instructions (Addendum)
Medication Instructions:  Your physician has recommended you make the following change in your medication:   1. Begin Ranolazine, 500mg  tablet, two times daily.  Labwork: You will have labs drawn today: CBC BMP   Testing/Procedures: Your physician has recommended that you have a Cardioversion (DCCV). Electrical Cardioversion uses a jolt of electricity to your heart either through paddles or wired patches attached to your chest. This is a controlled, usually prescheduled, procedure. Defibrillation is done under light anesthesia in the hospital, and you usually go home the day of the procedure. This is done to get your heart back into a normal rhythm. You are not awake for the procedure. Please see the instruction sheet given to you today.  Please arrive at the [facility] main entrance of [place] hospital at:   10 am on April 1  Do not eat or drink after midnight prior to procedure Do not take any medications the morning of the procedure except Tikosyn and Ranolazine You will need someone to drive you home at discharge   Follow-Up:  Your physician recommends that you schedule a follow-up appointment in:   [facility] on week of August 31, 2017  Dr Rayann Heman for ablation consult      Any Other Special Instructions Will Be Listed Below (If Applicable).     If you need a refill on your cardiac medications before your next appointment, please call your pharmacy.

## 2017-08-20 LAB — CBC WITH DIFFERENTIAL/PLATELET
BASOS ABS: 0 10*3/uL (ref 0.0–0.2)
Basos: 1 %
EOS (ABSOLUTE): 0.3 10*3/uL (ref 0.0–0.4)
Eos: 4 %
Hematocrit: 42.7 % (ref 37.5–51.0)
Hemoglobin: 15.1 g/dL (ref 13.0–17.7)
IMMATURE GRANS (ABS): 0 10*3/uL (ref 0.0–0.1)
Immature Granulocytes: 0 %
LYMPHS: 29 %
Lymphocytes Absolute: 1.9 10*3/uL (ref 0.7–3.1)
MCH: 34.3 pg — AB (ref 26.6–33.0)
MCHC: 35.4 g/dL (ref 31.5–35.7)
MCV: 97 fL (ref 79–97)
MONOS ABS: 0.8 10*3/uL (ref 0.1–0.9)
Monocytes: 12 %
NEUTROS ABS: 3.7 10*3/uL (ref 1.4–7.0)
Neutrophils: 54 %
PLATELETS: 238 10*3/uL (ref 150–379)
RBC: 4.4 x10E6/uL (ref 4.14–5.80)
RDW: 13.8 % (ref 12.3–15.4)
WBC: 6.7 10*3/uL (ref 3.4–10.8)

## 2017-08-20 LAB — BASIC METABOLIC PANEL
BUN / CREAT RATIO: 25 — AB (ref 10–24)
BUN: 28 mg/dL — AB (ref 8–27)
CHLORIDE: 94 mmol/L — AB (ref 96–106)
CO2: 21 mmol/L (ref 20–29)
Calcium: 9.4 mg/dL (ref 8.6–10.2)
Creatinine, Ser: 1.12 mg/dL (ref 0.76–1.27)
GFR calc Af Amer: 73 mL/min/{1.73_m2} (ref >59)
GFR calc non Af Amer: 63 mL/min/{1.73_m2} (ref >59)
GLUCOSE: 115 mg/dL — AB (ref 65–99)
POTASSIUM: 4.8 mmol/L (ref 3.5–5.2)
SODIUM: 131 mmol/L — AB (ref 134–144)

## 2017-08-23 ENCOUNTER — Encounter (HOSPITAL_COMMUNITY): Payer: Self-pay | Admitting: Anesthesiology

## 2017-08-24 ENCOUNTER — Encounter (HOSPITAL_COMMUNITY)
Admission: RE | Disposition: A | Discharge status: Home / Self Care | Payer: Self-pay | Source: Ambulatory Visit | Attending: Cardiology

## 2017-08-24 ENCOUNTER — Ambulatory Visit (HOSPITAL_COMMUNITY)
Admission: RE | Admit: 2017-08-24 | Discharge: 2017-08-24 | Disposition: A | Discharge status: Home / Self Care | Payer: Medicare Other | Source: Ambulatory Visit | Attending: Cardiology | Admitting: Cardiology

## 2017-08-24 DIAGNOSIS — I509 Heart failure, unspecified: Secondary | ICD-10-CM | POA: Insufficient documentation

## 2017-08-24 DIAGNOSIS — Z79899 Other long term (current) drug therapy: Secondary | ICD-10-CM | POA: Diagnosis not present

## 2017-08-24 DIAGNOSIS — M1990 Unspecified osteoarthritis, unspecified site: Secondary | ICD-10-CM | POA: Insufficient documentation

## 2017-08-24 DIAGNOSIS — Z9841 Cataract extraction status, right eye: Secondary | ICD-10-CM | POA: Insufficient documentation

## 2017-08-24 DIAGNOSIS — Z9889 Other specified postprocedural states: Secondary | ICD-10-CM | POA: Insufficient documentation

## 2017-08-24 DIAGNOSIS — I110 Hypertensive heart disease with heart failure: Secondary | ICD-10-CM | POA: Insufficient documentation

## 2017-08-24 DIAGNOSIS — I447 Left bundle-branch block, unspecified: Secondary | ICD-10-CM | POA: Diagnosis not present

## 2017-08-24 DIAGNOSIS — Z96653 Presence of artificial knee joint, bilateral: Secondary | ICD-10-CM | POA: Insufficient documentation

## 2017-08-24 DIAGNOSIS — Z9842 Cataract extraction status, left eye: Secondary | ICD-10-CM | POA: Insufficient documentation

## 2017-08-24 DIAGNOSIS — I481 Persistent atrial fibrillation: Secondary | ICD-10-CM | POA: Insufficient documentation

## 2017-08-24 DIAGNOSIS — Z7901 Long term (current) use of anticoagulants: Secondary | ICD-10-CM | POA: Insufficient documentation

## 2017-08-24 DIAGNOSIS — Q231 Congenital insufficiency of aortic valve: Secondary | ICD-10-CM | POA: Diagnosis not present

## 2017-08-24 DIAGNOSIS — K219 Gastro-esophageal reflux disease without esophagitis: Secondary | ICD-10-CM | POA: Insufficient documentation

## 2017-08-24 DIAGNOSIS — I480 Paroxysmal atrial fibrillation: Secondary | ICD-10-CM | POA: Insufficient documentation

## 2017-08-24 DIAGNOSIS — I429 Cardiomyopathy, unspecified: Secondary | ICD-10-CM | POA: Insufficient documentation

## 2017-08-24 DIAGNOSIS — N289 Disorder of kidney and ureter, unspecified: Secondary | ICD-10-CM | POA: Diagnosis not present

## 2017-08-24 DIAGNOSIS — I491 Atrial premature depolarization: Secondary | ICD-10-CM | POA: Diagnosis not present

## 2017-08-24 DIAGNOSIS — Z8719 Personal history of other diseases of the digestive system: Secondary | ICD-10-CM | POA: Insufficient documentation

## 2017-08-24 DIAGNOSIS — Z5309 Procedure and treatment not carried out because of other contraindication: Secondary | ICD-10-CM | POA: Insufficient documentation

## 2017-08-24 SURGERY — CANCELLED PROCEDURE

## 2017-08-24 MED ORDER — SODIUM CHLORIDE 0.9 % IV SOLN: INTRAVENOUS | Status: DC

## 2017-08-24 NOTE — CV Procedure (Signed)
The patient in NSR on arrival, confirmed on a 12 lead ECG. Discharged home.  Travis [name] 08/24/2017

## 2017-08-24 NOTE — Progress Notes (Signed)
Patient admitted to Endoscopy unit for Cardioversion. Patient placed on monitor and appeared to be in NSR. EKG obtained and MD confirmed NSR. Patient discharged to home with instructions to continue all medications and keep follow up appointments.

## 2017-08-24 NOTE — Interval H&P Note (Signed)
History and Physical Interval Note:  08/24/2017 10:31 AM  Travis [name]  has presented today for surgery, with the diagnosis of AFIB  The various methods of treatment have been discussed with the patient and family. After consideration of risks, benefits and other options for treatment, the patient has consented to  Procedure(s): CANCELLED [place] conversion as a surgical intervention .  The patient's history has been reviewed, patient examined, no change in status, stable for surgery.  I have reviewed the patient's chart and labs.  Questions were answered to the patient's satisfaction.     Ena Dawley

## 2017-08-26 ENCOUNTER — Telehealth: Payer: Self-pay

## 2017-08-26 NOTE — Telephone Encounter (Signed)
I have done a Ranolazine PA through covermymeds. Awaiting response.

## 2017-08-27 NOTE — Telephone Encounter (Signed)
F/U call:  Elta Guadeloupe calling with Christella Scheuermann states that he sent a fax yesterday for Ranolazine and would like to follow-up on the status.

## 2017-08-27 NOTE — Telephone Encounter (Signed)
[employer] requesting more information regarding approval of patients RANOLAZINE. Faxed more clinical information to them.

## 2017-08-31 ENCOUNTER — Encounter (HOSPITAL_COMMUNITY): Payer: Self-pay | Admitting: Nurse Practitioner

## 2017-08-31 ENCOUNTER — Ambulatory Visit (HOSPITAL_COMMUNITY)
Admission: RE | Admit: 2017-08-31 | Discharge: 2017-08-31 | Disposition: A | Discharge status: Home / Self Care | Payer: Medicare Other | Source: Ambulatory Visit | Attending: Nurse Practitioner | Admitting: Nurse Practitioner

## 2017-08-31 ENCOUNTER — Other Ambulatory Visit (HOSPITAL_COMMUNITY): Payer: Self-pay | Admitting: Nurse Practitioner

## 2017-08-31 VITALS — BP 130/84 | HR 140 | Ht 68.0 in | Wt 182.4 lb

## 2017-08-31 DIAGNOSIS — K219 Gastro-esophageal reflux disease without esophagitis: Secondary | ICD-10-CM | POA: Insufficient documentation

## 2017-08-31 DIAGNOSIS — Z823 Family history of stroke: Secondary | ICD-10-CM | POA: Diagnosis not present

## 2017-08-31 DIAGNOSIS — Z7901 Long term (current) use of anticoagulants: Secondary | ICD-10-CM | POA: Diagnosis not present

## 2017-08-31 DIAGNOSIS — I480 Paroxysmal atrial fibrillation: Secondary | ICD-10-CM | POA: Insufficient documentation

## 2017-08-31 DIAGNOSIS — I481 Persistent atrial fibrillation: Secondary | ICD-10-CM | POA: Diagnosis not present

## 2017-08-31 DIAGNOSIS — Z96653 Presence of artificial knee joint, bilateral: Secondary | ICD-10-CM | POA: Insufficient documentation

## 2017-08-31 DIAGNOSIS — Z801 Family history of malignant neoplasm of trachea, bronchus and lung: Secondary | ICD-10-CM | POA: Diagnosis not present

## 2017-08-31 DIAGNOSIS — Q231 Congenital insufficiency of aortic valve: Secondary | ICD-10-CM | POA: Insufficient documentation

## 2017-08-31 DIAGNOSIS — I110 Hypertensive heart disease with heart failure: Secondary | ICD-10-CM | POA: Diagnosis not present

## 2017-08-31 DIAGNOSIS — Z79899 Other long term (current) drug therapy: Secondary | ICD-10-CM | POA: Diagnosis not present

## 2017-08-31 DIAGNOSIS — Z9842 Cataract extraction status, left eye: Secondary | ICD-10-CM | POA: Insufficient documentation

## 2017-08-31 DIAGNOSIS — Z8711 Personal history of peptic ulcer disease: Secondary | ICD-10-CM | POA: Diagnosis not present

## 2017-08-31 DIAGNOSIS — I509 Heart failure, unspecified: Secondary | ICD-10-CM | POA: Diagnosis not present

## 2017-08-31 DIAGNOSIS — Z9841 Cataract extraction status, right eye: Secondary | ICD-10-CM | POA: Insufficient documentation

## 2017-08-31 DIAGNOSIS — Z87891 Personal history of nicotine dependence: Secondary | ICD-10-CM | POA: Diagnosis not present

## 2017-08-31 DIAGNOSIS — I4819 Other persistent atrial fibrillation: Secondary | ICD-10-CM

## 2017-08-31 MED ORDER — AMIODARONE HCL 200 MG PO TABS: 200.0000 mg | ORAL_TABLET | Freq: Two times a day (BID) | ORAL | 0 refills | Status: DC

## 2017-08-31 NOTE — Progress Notes (Signed)
Primary Care Physician: [name], MD Referring Physician: Dr. Nance Pear is a 77 y.o. male with a h/o paroxysmal afib, that was in the hospital 3/20 with acute pulmonary edema 2/2 to afib with RVR. Pt has been on tikosyn for some time. This was continued and carvedilol was increased to 25 mg bid. EF by TEE was 30 % and he was successfully cardioverted. He followed with [name] 3/27 and was back in afib.He started ranolazine and was set pt up for another cardioversion. He had  self converted at time of cardioversion, however he is now back in afib at 140 bpm. Feels he is not tolerating as well this time. Questioning if he is not tolerating ranolazine. [name] has set him up to see [name] for possible ablation this Wednesday and also per his note, it tikosyn/ranolazine should not keep him in rhythm, then d/c tikosyn and start amiodarone. Left atrial size is 51 mm.  Today, he denies symptoms of palpitations, chest pain, shortness of breath, orthopnea, PND, lower extremity edema, dizziness, presyncope, syncope, or neurologic sequela. The patient is tolerating medications without difficulties and is otherwise without complaint today.   Past Medical History:  Diagnosis Date  . Arthritis   . Bicuspid aortic valve   . Cardiomyopathy -resolved    tachycardia-induced, EF 55% June 2009  . CHF (congestive heart failure) ([place])    JULY 2014  . GERD (gastroesophageal reflux disease)   . History of cardioversion 12/16/2012  . History of stomach ulcers   . Hypertension   . Left bundle branch block   . Paroxysmal atrial fibrillation (HCC)   . Sinus bradycardia   . Status post clamping of cerebral aneurysm 80   Past Surgical History:  Procedure Laterality Date  . CARDIAC CATHETERIZATION    . CARDIOVERSION N/A 12/16/2012   Procedure: CARDIOVERSION;  Surgeon: Lelon Perla, MD;  Location: [facility] ENDOSCOPY;  Service: Cardiovascular;  Laterality: N/A;  . CARDIOVERSION N/A  08/14/2017   Procedure: CARDIOVERSION;  Surgeon: Josue Hector, MD;  Location: [facility] ENDOSCOPY;  Service: Cardiovascular;  Laterality: N/A;  . CATARACT EXTRACTION  05/2015   bilateral  . cerebral anuersym post clips    . fractured left arm    . HERNIA REPAIR     lft  . INGUINAL HERNIA REPAIR  07/02/2011   Procedure: LAPAROSCOPIC INGUINAL HERNIA;  Surgeon: Harl Bowie, MD;  Location: [place];  Service: General;  Laterality: Left;  Laparoscopic left inguinal hernia repair and mesh  . left tendon repair     lft foot  . ROTATOR CUFF REPAIR     lf  . TEE WITHOUT CARDIOVERSION N/A 08/14/2017   Procedure: TRANSESOPHAGEAL ECHOCARDIOGRAM (TEE);  Surgeon: Josue Hector, MD;  Location: [facility] ENDOSCOPY;  Service: Cardiovascular;  Laterality: N/A;  . TONSILLECTOMY    . TOTAL KNEE ARTHROPLASTY Bilateral 02/06/2014   Procedure: TOTAL KNEE BILATERAL;  Surgeon: Mauri Pole, MD;  Location: [facility] ORS;  Service: Orthopedics;  Laterality: Bilateral;  . WRIST GANGLION EXCISION     lft    Current Outpatient Medications  Medication Sig Dispense Refill  . ALPRAZolam (XANAX) 0.5 MG tablet Take 1 tablet (0.5 mg total) by mouth 2 (two) times daily as needed for anxiety (separate by 8 hours from temazepam). 5 tablet 0  . atorvastatin (LIPITOR) 40 MG tablet Take 1 tablet (40 mg total) by mouth once a week. (Patient taking differently: Take 40 mg by mouth every Sunday. )  14 tablet 3  . b complex vitamins tablet Take 1 tablet by mouth daily.     . carvedilol (COREG) 25 MG tablet Take 1 tablet (25 mg total) by mouth 2 (two) times daily with a meal. 60 tablet 6  . finasteride (PROPECIA) 1 MG tablet TAKE 1 TABLET(1 MG) BY MOUTH DAILY (Patient taking differently: Take 1 mg by mouth once a day) 30 tablet 5  . fish oil-omega-3 fatty acids 1000 MG capsule Take 1,000 mg by mouth daily.     . Flaxseed, Linseed, (RA FLAX SEED OIL 1000 PO) Take 1,000 mg by mouth 3 (three) times daily.     . furosemide (LASIX) 40 MG tablet Take 1  tablet (40 mg total) by mouth daily. 30 tablet 3  . hydroxypropyl methylcellulose / hypromellose (ISOPTO TEARS / GONIOVISC) 2.5 % ophthalmic solution Place 2 drops into both eyes 3 (three) times daily as needed for dry eyes.    . meloxicam (MOBIC) 15 MG tablet TAKE 1 TABLET BY MOUTH DAILY (Patient taking differently: Take 15 mg by mouth once a day) 90 tablet 0  . Multiple Vitamins-Minerals (ICAPS AREDS 2) CAPS Take 1 capsule by mouth 2 (two) times daily.     Marland Kitchen omeprazole (PRILOSEC) 20 MG capsule TAKE 1 CAPSULE BY MOUTH DAILY (Patient taking differently: Take 20 mg by mouth once a day) 90 capsule 0  . potassium chloride (K-DUR) 10 MEQ tablet Take 1 tablet (10 mEq total) by mouth daily. 30 tablet 3  . rivaroxaban (XARELTO) 20 MG TABS tablet Take 1 tablet (20 mg total) by mouth daily with supper. 30 tablet 6  . temazepam (RESTORIL) 15 MG capsule TAKE ONE CAPSULE BY MOUTH AT BEDTIME AS NEEDED (Patient taking differently: Take 15 mg by mouth at bedtime as needed for sleep) 30 capsule 2  . acetaminophen (TYLENOL) 500 MG tablet Take 1,000 mg by mouth every 6 (six) hours as needed for mild pain or moderate pain. Reported on 08/01/2015    . amiodarone (PACERONE) 200 MG tablet TAKE 1 TABLET(200 MG) BY MOUTH TWICE DAILY 60 tablet 0   No current facility-administered medications for this encounter.     No Known Allergies  Social History   Socioeconomic History  . Marital status: Single    Spouse name: Not on file  . Number of children: 0  . Years of education: Not on file  . Highest education level: Not on file  Occupational History  . Occupation: Retired  Scientific laboratory technician  . Financial resource strain: Not on file  . Food insecurity:    Worry: Not on file    Inability: Not on file  . Transportation needs:    Medical: Not on file    Non-medical: Not on file  Tobacco Use  . Smoking status: Former Smoker    Packs/day: 1.50    Years: 25.00    Pack years: 37.50    Last attempt to quit: 05/26/1986     Years since quitting: 31.2  . Smokeless tobacco: Never Used  Substance and Sexual Activity  . Alcohol use: Yes    Alcohol/week: 0.5 oz    Types: 1 Standard drinks or equivalent per week    Comment: stopped drinking   . Drug use: No  . Sexual activity: Not Currently  Lifestyle  . Physical activity:    Days per week: Not on file    Minutes per session: Not on file  . Stress: Not on file  Relationships  . Social connections:  Talks on phone: Not on file    Gets together: Not on file    Attends religious service: Not on file    Active member of club or organization: Not on file    Attends meetings of clubs or organizations: Not on file    Relationship status: Not on file  . Intimate partner violence:    Fear of current or ex partner: Not on file    Emotionally abused: Not on file    Physically abused: Not on file    Forced sexual activity: Not on file  Other Topics Concern  . Not on file  Social History Narrative   Homosexual. Lives alone. Not sexually active.       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 Stroke Father 41       smoker  . Lung cancer Mother 66       former smoker  . Stroke Mother   . Stroke Brother   . Colon cancer Neg Hx     ROS- All systems are reviewed and negative except as per the HPI above  Physical Exam: Vitals:   08/31/17 1025  BP: 130/84  Pulse: (!) 140  SpO2: 93%  Weight: 182 lb 6.4 oz (82.7 kg)  Height: 5\' 8"  (1.727 m)   Wt Readings from Last 3 Encounters:  08/31/17 182 lb 6.4 oz (82.7 kg)  08/19/17 181 lb (82.1 kg)  08/16/17 173 lb 15.1 oz (78.9 kg)    Labs: Lab Results  Component Value Date   NA 131 (L) 08/19/2017   K 4.8 08/19/2017   CL 94 (L) 08/19/2017   CO2 21 08/19/2017   GLUCOSE 115 (H) 08/19/2017   BUN 28 (H) 08/19/2017   CREATININE 1.12 08/19/2017   CALCIUM 9.4  08/19/2017   MG 2.1 08/14/2017   Lab Results  Component Value Date   INR 0.89 02/06/2014   Lab Results  Component Value Date   CHOL 133 08/14/2017   HDL 57 08/14/2017   LDLCALC 65 08/14/2017   TRIG 54 08/14/2017     GEN- The patient is well appearing, alert and oriented x 3 today.   Head- normocephalic, atraumatic Eyes-  Sclera clear, conjunctiva pink Ears- hearing intact Oropharynx- clear Neck- supple, no JVP Lymph- no cervical lymphadenopathy Lungs- Clear to ausculation bilaterally, normal work of breathing Heart- Regular rate and rhythm, no murmurs, rubs or gallops, PMI not laterally displaced GI- soft, NT, ND, + BS Extremities- no clubbing, cyanosis, or edema MS- no significant deformity or atrophy Skin- no rash or lesion Psych- euthymic mood, full affect Neuro- strength and sensation are intact  EKG-atrial fib at 140 bpm, qrs int 140 mm, qtc 537 ms Epic records reviewed Echo-Study Conclusions  - Left ventricle: The cavity size was normal. Wall thickness was   increased in a pattern of moderate LVH. Systolic function was   moderately to severely reduced. The estimated ejection fraction   was in the range of 30% to 35%. - Aortic valve: AV is thickened, calcified with restricted motion.   Peak and mean gradients through the valve are 16 and 11 mm Hg   respectively. Dimensionless index is 0.48. 2 D imaging suggests   moderate AS. There was mild regurgitation. - Left atrium: The atrium was severely dilated, 51 mm with LA volume of 143 ml -  Right ventricle: The cavity size was mildly dilated. Systolic   function was mildly to moderately reduced. - Pulmonary arteries: PA peak pressure: 46 mm Hg (S).     Assessment and Plan: 1. Afib with RVR Has failed tikosyn Stop tikosyn and allow 48 hour washout Will start amiodarone 200 mg bid starting  Wed pm per Dr. Olin Pia plan from  his last office note Stop ranolazine as he feels like he is not tolerating He has f/u  with [name] to discuss ablation, Wednesday, left atrial size will be a concern, as it is severely dilated Continue carvedilol at 25 mg bid  2. LV dysfunction Probably TMC Weight is stable He was told to weigh daily Continue diuretic To ER if breathing significantly worsens   Butch Penny C. Rasul Decola, [facility] [address] [place], [place] [address] [phone]

## 2017-08-31 NOTE — Patient Instructions (Addendum)
Stop Tikosyn  Stop Ranexa  Start Amiodarone 200mg  twice a day on Wednesday Night

## 2017-08-31 NOTE — Telephone Encounter (Signed)
We received a denial on the pts Ranolazine via fax from [place]. Reason:  Generic Ranolazine for a DX of paroxysmal atrial fibrillation does not meet your plans non-formulary exception requirements.   Per OV notes from today (08/31/17) Ranolazine has been stopped due to the pt not tolerating.

## 2017-09-02 ENCOUNTER — Encounter: Payer: Self-pay | Admitting: Internal Medicine

## 2017-09-02 ENCOUNTER — Ambulatory Visit (INDEPENDENT_AMBULATORY_CARE_PROVIDER_SITE_OTHER): Payer: Medicare Other | Admitting: Internal Medicine

## 2017-09-02 VITALS — BP 117/79 | HR 110 | Ht 68.0 in | Wt 182.0 lb

## 2017-09-02 DIAGNOSIS — I481 Persistent atrial fibrillation: Secondary | ICD-10-CM | POA: Diagnosis not present

## 2017-09-02 DIAGNOSIS — I4819 Other persistent atrial fibrillation: Secondary | ICD-10-CM

## 2017-09-02 NOTE — Progress Notes (Signed)
Electrophysiology Office Note  Date:  09/02/2017   ID:  Travis [name], DOB Jun 15, 1940, MRN [id number]  PCP:  [name], MD    Primary Electrophysiologist: Dr Caryl Comes  CC: afib   History of Present Illness: Travis [name] is a 77 y.o. male who presents today for electrophysiology evaluation.   The patient presents today on referral from AF clinic for consideration of ablation.  He reports having afib for over 10 years. The patient has been followed previously by Dr Caryl Comes.  He has severe LA enlargement.  He has failed medical therapy with tikosyn.  He did not tolerate ranolazine due to dizziness.  His afib is ongoing.  He has fatigue and palpitations.  Decreased exercise tolerance.  Today, he denies symptoms of chest pain, shortness of breath, orthopnea, PND, lower extremity edema, claudication, dizziness, presyncope, syncope, bleeding, or neurologic sequela. The patient is tolerating medications without difficulties and is otherwise without complaint today.    Past Medical History:  Diagnosis Date  . Arthritis   . Bicuspid aortic valve   . Cardiomyopathy -resolved    tachycardia-induced, EF 55% June 2009  . CHF (congestive heart failure) ([place])    JULY 2014  . GERD (gastroesophageal reflux disease)   . History of cardioversion 12/16/2012  . History of stomach ulcers   . Hypertension   . Left bundle branch block   . Paroxysmal atrial fibrillation ([facility])   . Sinus bradycardia   . Status post clamping of cerebral aneurysm 79   Past Surgical History:  Procedure Laterality Date  . CARDIAC CATHETERIZATION    . CARDIOVERSION N/A 12/16/2012   Procedure: CARDIOVERSION;  Surgeon: Lelon Perla, MD;  Location: [facility] ENDOSCOPY;  Service: Cardiovascular;  Laterality: N/A;  . CARDIOVERSION N/A 08/14/2017   Procedure: CARDIOVERSION;  Surgeon: Josue Hector, MD;  Location: [facility] ENDOSCOPY;  Service: Cardiovascular;  Laterality: N/A;  . CATARACT EXTRACTION  05/2015   bilateral  . cerebral  anuersym post clips    . fractured left arm    . HERNIA REPAIR     lft  . INGUINAL HERNIA REPAIR  07/02/2011   Procedure: LAPAROSCOPIC INGUINAL HERNIA;  Surgeon: Harl Bowie, MD;  Location: [facility];  Service: General;  Laterality: Left;  Laparoscopic left inguinal hernia repair and mesh  . left tendon repair     lft foot  . ROTATOR CUFF REPAIR     lf  . TEE WITHOUT CARDIOVERSION N/A 08/14/2017   Procedure: TRANSESOPHAGEAL ECHOCARDIOGRAM (TEE);  Surgeon: Josue Hector, MD;  Location: [facility] ENDOSCOPY;  Service: Cardiovascular;  Laterality: N/A;  . TONSILLECTOMY    . TOTAL KNEE ARTHROPLASTY Bilateral 02/06/2014   Procedure: TOTAL KNEE BILATERAL;  Surgeon: Mauri Pole, MD;  Location: [facility] ORS;  Service: Orthopedics;  Laterality: Bilateral;  . WRIST GANGLION EXCISION     lft     Current Outpatient Medications  Medication Sig Dispense Refill  . acetaminophen (TYLENOL) 500 MG tablet Take 1,000 mg by mouth every 6 (six) hours as needed for mild pain or moderate pain. Reported on 08/01/2015    . ALPRAZolam (XANAX) 0.5 MG tablet Take 1 tablet (0.5 mg total) by mouth 2 (two) times daily as needed for anxiety (separate by 8 hours from temazepam). 5 tablet 0  . amiodarone (PACERONE) 200 MG tablet TAKE 1 TABLET(200 MG) BY MOUTH TWICE DAILY 60 tablet 0  . atorvastatin (LIPITOR) 40 MG tablet Take 1 tablet (40 mg total) by mouth once a week. (Patient taking  differently: Take 40 mg by mouth every Sunday. ) 14 tablet 3  . b complex vitamins tablet Take 1 tablet by mouth daily.     . carvedilol (COREG) 25 MG tablet Take 1 tablet (25 mg total) by mouth 2 (two) times daily with a meal. 60 tablet 6  . finasteride (PROPECIA) 1 MG tablet TAKE 1 TABLET(1 MG) BY MOUTH DAILY (Patient taking differently: Take 1 mg by mouth once a day) 30 tablet 5  . fish oil-omega-3 fatty acids 1000 MG capsule Take 1,000 mg by mouth daily.     . Flaxseed, Linseed, (RA FLAX SEED OIL 1000 PO) Take 1,000 mg by mouth 3 (three) times  daily.     . furosemide (LASIX) 40 MG tablet Take 1 tablet (40 mg total) by mouth daily. 30 tablet 3  . hydroxypropyl methylcellulose / hypromellose (ISOPTO TEARS / GONIOVISC) 2.5 % ophthalmic solution Place 2 drops into both eyes 3 (three) times daily as needed for dry eyes.    . meloxicam (MOBIC) 15 MG tablet TAKE 1 TABLET BY MOUTH DAILY (Patient taking differently: Take 15 mg by mouth once a day) 90 tablet 0  . Multiple Vitamins-Minerals (ICAPS AREDS 2) CAPS Take 1 capsule by mouth 2 (two) times daily.     Marland Kitchen omeprazole (PRILOSEC) 20 MG capsule TAKE 1 CAPSULE BY MOUTH DAILY (Patient taking differently: Take 20 mg by mouth once a day) 90 capsule 0  . potassium chloride (K-DUR) 10 MEQ tablet Take 1 tablet (10 mEq total) by mouth daily. 30 tablet 3  . rivaroxaban (XARELTO) 20 MG TABS tablet Take 1 tablet (20 mg total) by mouth daily with supper. 30 tablet 6  . temazepam (RESTORIL) 15 MG capsule TAKE ONE CAPSULE BY MOUTH AT BEDTIME AS NEEDED (Patient taking differently: Take 15 mg by mouth at bedtime as needed for sleep) 30 capsule 2   No current facility-administered medications for this visit.     Allergies:   Patient has no known allergies.   Social History:  The patient  reports that he quit smoking about 31 years ago. He has a 37.50 pack-year smoking history. He has never used smokeless tobacco. He reports that he drinks about 0.5 oz of alcohol per week. He reports that he does not use drugs.   Family History:  The patient's  family history includes Lung cancer (age of onset: 51) in his mother; Stroke in his brother and mother; Stroke (age of onset: 29) in his father.    ROS:  Please see the history of present illness.   All other systems are personally reviewed and negative.    PHYSICAL EXAM: VS:  BP 117/79   Pulse (!) 110   Ht 5\' 8"  (1.727 m)   Wt 182 lb (82.6 kg)   BMI 27.67 kg/m  , BMI Body mass index is 27.67 kg/m. GEN: Well nourished, well developed, in no acute distress    HEENT: normal  Neck: no JVD, carotid bruits, or masses Cardiac: iRRR; no murmurs, rubs, or gallops,no edema  Respiratory:  clear to auscultation bilaterally, normal work of breathing GI: soft, nontender, nondistended, + BS MS: no deformity or atrophy  Skin: warm and dry  Neuro:  Strength and sensation are intact Psych: euthymic mood, full affect  EKG:  EKG is ordered today. The ekg ordered today is personally reviewed and shows afib, V rate 110 bpm, IVCD, nonspecific ST/T changes   Recent Labs: 08/12/2017: B Natriuretic Peptide 649.3; TSH 2.120 08/14/2017: Magnesium 2.1 08/19/2017: BUN  28; Creatinine, Ser 1.12; Hemoglobin 15.1; Platelets 238; Potassium 4.8; Sodium 131  personally reviewed   Lipid Panel     Component Value Date/Time   CHOL 133 08/14/2017 0233   TRIG 54 08/14/2017 0233   HDL 57 08/14/2017 0233   CHOLHDL 2.3 08/14/2017 0233   VLDL 11 08/14/2017 0233   LDLCALC 65 08/14/2017 0233   LDLDIRECT 74.0 07/31/2016 1012   personally reviewed   Wt Readings from Last 3 Encounters:  09/02/17 182 lb (82.6 kg)  08/31/17 182 lb 6.4 oz (82.7 kg)  08/19/17 181 lb (82.1 kg)      Other studies personally reviewed: Additional studies/ records that were reviewed today include: prior echo, AF clinic notes, Dr Aquilla Hacker notes  Review of the above records today demonstrates: as above   ASSESSMENT AND PLAN:  1.  Persistent afib The patient has symptomatic persistent afib.  He has failed medical therapy with tikosyn and ranolazine. Therapeutic strategies for afib including medicine and ablation were discussed in detail with the patient today. Risk, benefits, and alternatives to EP study and radiofrequency ablation for afib were also discussed in detail today. These risks include but are not limited to stroke, bleeding, vascular damage, tamponade, perforation, damage to the esophagus, lungs, and other structures, pulmonary vein stenosis, worsening renal function, and death. The patient  understands these risk and wishes to proceed.  We will therefore proceed with catheter ablation at the next available time.  Carto/ICE/ anesthesia are requested.  Will obtain cardiac CT prior to ablation. Will start amiodarone for rate control in the interim.    Current medicines are reviewed at length with the patient today.   The patient does not have concerns regarding his medicines.  The following changes were made today:  none  Labs/ tests ordered today include:  Orders Placed This Encounter  Procedures  . EKG 12-Lead     Signed, [name], MD  09/02/2017 11:14 AM     [address] [place] [address] [phone] (office) [phone] (fax)

## 2017-09-02 NOTE — Patient Instructions (Addendum)
Medication Instructions:  Your physician recommends that you continue on your current medications as directed. Please refer to the Current Medication list given to you today. If you need a refill on your cardiac medications before your next appointment, please call your pharmacy.  Labwork: You will get lab work within 14 days of your procedure:  BMP and CBC.  Please schedule lab work within 14 days of Oct 06, 2017  Testing/Procedures:  Your physician has requested that you have cardiac CT. Cardiac computed tomography (CT) is a painless test that uses an x-ray machine to take clear, detailed pictures of your heart. For further information please visit [email or web address]. Please follow instruction sheet as given.-You will get a call from our office to schedule the date for this test.  Your physician has recommended that you have an ablation. Catheter ablation is a medical procedure used to treat some cardiac arrhythmias (irregular heartbeats). During catheter ablation, a long, thin, flexible tube is put into a blood vessel in your groin (upper thigh), or neck. This tube is called an ablation catheter. It is then guided to your heart through the blood vessel. Radio frequency waves destroy small areas of heart tissue where abnormal heartbeats may cause an arrhythmia to start. Please see the instruction sheet given to you today.  Follow-Up: You will follow up with Roderic Palau, NP with the Atrial fibrillation (Afib) clinic 4 weeks after your ablation.  You will follow up with [name] 3 months after your procedure.  CARDIAC CT INSTRUCTIONS:  Please arrive at the [facility] main entrance of [facility] [place], [place] [address] [phone]  Proceed to the [facility] Radiology Department (First Floor).  Please follow these instructions carefully (unless otherwise directed):  Hold all erectile dysfunction medications at least 48 hours  prior to test.  On the Night Before the Test: . Drink plenty of water. . Do not consume any caffeinated/decaffeinated beverages or chocolate 12 hours prior to your test. . Do not take any antihistamines 12 hours prior to your test.   On the Day of the Test: . Drink plenty of water. Do not drink any water within one hour of the test. . Do not eat any food 4 hours prior to the test. . You may take your regular medications prior to the test. . IF NOT ON A BETA BLOCKER - Take 50 mg of lopressor (metoprolol) one hour before the test.  You are on a beta blocker.  Take your carvedilol prior to this procedure. Marland Kitchen HOLD Furosemide morning of the test.  After the Test: . Drink plenty of water. . After receiving IV contrast, you may experience a mild flushed feeling. This is normal. . On occasion, you may experience a mild rash up to 24 hours after the test. This is not dangerous. If this occurs, you can take Benadryl 25 mg and increase your fluid intake. . If you experience trouble breathing, this can be serious. If it is severe call 911 IMMEDIATELY. If it is mild, please call our office.  ABLATION INSTRUCTIONS:  Please arrive at the [facility] main entrance of [facility] hospital at: 9:00 am on Oct 06, 2017 Do not eat or drink after midnight prior to procedure On the morning of your procedure do not take any medications Plan for one night stay.  You will need someone to drive you home at discharge.   Cardiac Ablation Cardiac ablation is a procedure to disable (ablate) a  small amount of heart tissue in very specific places. The heart has many electrical connections. Sometimes these connections are abnormal and can cause the heart to beat very fast or irregularly. Ablating some of the problem areas can improve the heart rhythm or return it to normal. Ablation may be done for people who:  Have Wolff-Parkinson-White syndrome.  Have fast heart rhythms (tachycardia).  Have taken medicines for an  abnormal heart rhythm (arrhythmia) that were not effective or caused side effects.  Have a high-risk heartbeat that may be life-threatening.  During the procedure, a small incision is made in the neck or the groin, and a long, thin, flexible tube (catheter) is inserted into the incision and moved to the heart. Small devices (electrodes) on the tip of the catheter will send out electrical currents. A type of X-ray (fluoroscopy) will be used to help guide the catheter and to provide images of the heart. Tell a health care provider about:  Any allergies you have.  All medicines you are taking, including vitamins, herbs, eye drops, creams, and over-the-counter medicines.  Any problems you or family members have had with anesthetic medicines.  Any blood disorders you have.  Any surgeries you have had.  Any medical conditions you have, such as kidney failure.  Whether you are pregnant or may be pregnant. What are the risks? Generally, this is a safe procedure. However, problems may occur, including:  Infection.  Bruising and bleeding at the catheter insertion site.  Bleeding into the chest, especially into the sac that surrounds the heart. This is a serious complication.  Stroke or blood clots.  Damage to other structures or organs.  Allergic reaction to medicines or dyes.  Need for a permanent pacemaker if the normal electrical system is damaged. A pacemaker is a small computer that sends electrical signals to the heart and helps your heart beat normally.  The procedure not being fully effective. This may not be recognized until months later. Repeat ablation procedures are sometimes required.  What happens before the procedure?  Follow instructions from your health care provider about eating or drinking restrictions.  Ask your health care provider about: ? Changing or stopping your regular medicines. This is especially important if you are taking diabetes medicines or blood  thinners. ? Taking medicines such as aspirin and ibuprofen. These medicines can thin your blood. Do not take these medicines before your procedure if your health care provider instructs you not to.  Plan to have someone take you home from the hospital or clinic.  If you will be going home right after the procedure, plan to have someone with you for 24 hours. What happens during the procedure?  To lower your risk of infection: ? Your health care team will wash or sanitize their hands. ? Your skin will be washed with soap. ? Hair may be removed from the incision area.  An IV tube will be inserted into one of your veins.  You will be given a medicine to help you relax (sedative).  The skin on your neck or groin will be numbed.  An incision will be made in your neck or your groin.  A needle will be inserted through the incision and into a large vein in your neck or groin.  A catheter will be inserted into the needle and moved to your heart.  Dye may be injected through the catheter to help your surgeon see the area of the heart that needs treatment.  Electrical currents will be  sent from the catheter to ablate heart tissue in desired areas. There are three types of energy that may be used to ablate heart tissue: ? Heat (radiofrequency energy). ? Laser energy. ? Extreme cold (cryoablation).  When the necessary tissue has been ablated, the catheter will be removed.  Pressure will be held on the catheter insertion area to prevent excessive bleeding.  A bandage (dressing) will be placed over the catheter insertion area. The procedure may vary among health care providers and hospitals. What happens after the procedure?  Your blood pressure, heart rate, breathing rate, and blood oxygen level will be monitored until the medicines you were given have worn off.  Your catheter insertion area will be monitored for bleeding. You will need to lie still for a few hours to ensure that you do  not bleed from the catheter insertion area.  Do not drive for 24 hours or as long as directed by your health care provider. Summary  Cardiac ablation is a procedure to disable (ablate) a small amount of heart tissue in very specific places. Ablating some of the problem areas can improve the heart rhythm or return it to normal.  During the procedure, electrical currents will be sent from the catheter to ablate heart tissue in desired areas. This information is not intended to replace advice given to you by your health care provider. Make sure you discuss any questions you have with your health care provider. Document Released: 09/28/2008 Document Revised: 03/31/2016 Document Reviewed: 03/31/2016 Elsevier Interactive Patient Education  [employer].

## 2017-09-03 ENCOUNTER — Telehealth: Payer: Self-pay

## 2017-09-03 NOTE — Telephone Encounter (Signed)
error 

## 2017-09-17 ENCOUNTER — Encounter (HOSPITAL_COMMUNITY): Payer: Self-pay | Admitting: *Deleted

## 2017-09-17 ENCOUNTER — Ambulatory Visit: Payer: Medicare Other | Admitting: Family Medicine

## 2017-09-17 ENCOUNTER — Inpatient Hospital Stay (HOSPITAL_COMMUNITY)
Admission: EM | Admit: 2017-09-17 | Discharge: 2017-09-21 | DRG: 308 | Disposition: A | Discharge status: Home / Self Care | Payer: Medicare Other | Attending: Internal Medicine | Admitting: Internal Medicine

## 2017-09-17 ENCOUNTER — Emergency Department (HOSPITAL_COMMUNITY): Payer: Medicare Other

## 2017-09-17 ENCOUNTER — Other Ambulatory Visit: Payer: Self-pay

## 2017-09-17 DIAGNOSIS — I110 Hypertensive heart disease with heart failure: Secondary | ICD-10-CM | POA: Diagnosis not present

## 2017-09-17 DIAGNOSIS — I5023 Acute on chronic systolic (congestive) heart failure: Secondary | ICD-10-CM | POA: Diagnosis not present

## 2017-09-17 DIAGNOSIS — I10 Essential (primary) hypertension: Secondary | ICD-10-CM | POA: Diagnosis present

## 2017-09-17 DIAGNOSIS — I481 Persistent atrial fibrillation: Secondary | ICD-10-CM | POA: Diagnosis not present

## 2017-09-17 DIAGNOSIS — D649 Anemia, unspecified: Secondary | ICD-10-CM | POA: Diagnosis present

## 2017-09-17 DIAGNOSIS — I447 Left bundle-branch block, unspecified: Secondary | ICD-10-CM | POA: Diagnosis present

## 2017-09-17 DIAGNOSIS — E871 Hypo-osmolality and hyponatremia: Secondary | ICD-10-CM | POA: Diagnosis present

## 2017-09-17 DIAGNOSIS — M1990 Unspecified osteoarthritis, unspecified site: Secondary | ICD-10-CM | POA: Diagnosis present

## 2017-09-17 DIAGNOSIS — I420 Dilated cardiomyopathy: Secondary | ICD-10-CM | POA: Diagnosis present

## 2017-09-17 DIAGNOSIS — Z8711 Personal history of peptic ulcer disease: Secondary | ICD-10-CM

## 2017-09-17 DIAGNOSIS — I429 Cardiomyopathy, unspecified: Secondary | ICD-10-CM | POA: Diagnosis present

## 2017-09-17 DIAGNOSIS — N179 Acute kidney failure, unspecified: Secondary | ICD-10-CM | POA: Diagnosis present

## 2017-09-17 DIAGNOSIS — K219 Gastro-esophageal reflux disease without esophagitis: Secondary | ICD-10-CM | POA: Diagnosis present

## 2017-09-17 DIAGNOSIS — R0602 Shortness of breath: Secondary | ICD-10-CM | POA: Diagnosis not present

## 2017-09-17 DIAGNOSIS — I4819 Other persistent atrial fibrillation: Secondary | ICD-10-CM

## 2017-09-17 DIAGNOSIS — Z823 Family history of stroke: Secondary | ICD-10-CM

## 2017-09-17 DIAGNOSIS — I5021 Acute systolic (congestive) heart failure: Secondary | ICD-10-CM | POA: Diagnosis not present

## 2017-09-17 DIAGNOSIS — Z791 Long term (current) use of non-steroidal anti-inflammatories (NSAID): Secondary | ICD-10-CM

## 2017-09-17 DIAGNOSIS — R001 Bradycardia, unspecified: Secondary | ICD-10-CM | POA: Diagnosis present

## 2017-09-17 DIAGNOSIS — I4891 Unspecified atrial fibrillation: Secondary | ICD-10-CM

## 2017-09-17 DIAGNOSIS — Q231 Congenital insufficiency of aortic valve: Secondary | ICD-10-CM

## 2017-09-17 DIAGNOSIS — I509 Heart failure, unspecified: Secondary | ICD-10-CM

## 2017-09-17 DIAGNOSIS — Z7901 Long term (current) use of anticoagulants: Secondary | ICD-10-CM

## 2017-09-17 DIAGNOSIS — I493 Ventricular premature depolarization: Secondary | ICD-10-CM | POA: Diagnosis present

## 2017-09-17 DIAGNOSIS — F419 Anxiety disorder, unspecified: Secondary | ICD-10-CM | POA: Diagnosis present

## 2017-09-17 DIAGNOSIS — I480 Paroxysmal atrial fibrillation: Secondary | ICD-10-CM | POA: Diagnosis present

## 2017-09-17 DIAGNOSIS — Z96653 Presence of artificial knee joint, bilateral: Secondary | ICD-10-CM | POA: Diagnosis present

## 2017-09-17 LAB — CBC
HCT: 39.7 % (ref 39.0–52.0)
Hemoglobin: 13.5 g/dL (ref 13.0–17.0)
MCH: 33.4 pg (ref 26.0–34.0)
MCHC: 34 g/dL (ref 30.0–36.0)
MCV: 98.3 fL (ref 78.0–100.0)
PLATELETS: 161 10*3/uL (ref 150–400)
RBC: 4.04 MIL/uL — ABNORMAL LOW (ref 4.22–5.81)
RDW: 14.4 % (ref 11.5–15.5)
WBC: 7.3 10*3/uL (ref 4.0–10.5)

## 2017-09-17 LAB — BASIC METABOLIC PANEL
ANION GAP: 12 (ref 5–15)
BUN: 49 mg/dL — ABNORMAL HIGH (ref 6–20)
CALCIUM: 8.9 mg/dL (ref 8.9–10.3)
CO2: 18 mmol/L — AB (ref 22–32)
CREATININE: 1.81 mg/dL — AB (ref 0.61–1.24)
Chloride: 102 mmol/L (ref 101–111)
GFR calc Af Amer: 40 mL/min — ABNORMAL LOW (ref >60)
GFR, EST NON AFRICAN AMERICAN: 34 mL/min — AB (ref >60)
GLUCOSE: 217 mg/dL — AB (ref 65–99)
Potassium: 3.8 mmol/L (ref 3.5–5.1)
Sodium: 132 mmol/L — ABNORMAL LOW (ref 135–145)

## 2017-09-17 LAB — MAGNESIUM: MAGNESIUM: 1.7 mg/dL (ref 1.7–2.4)

## 2017-09-17 LAB — I-STAT TROPONIN, ED: Troponin i, poc: 0.02 ng/mL (ref 0.00–0.08)

## 2017-09-17 LAB — TSH: TSH: 5.065 u[IU]/mL — ABNORMAL HIGH (ref 0.350–4.500)

## 2017-09-17 LAB — PROTIME-INR
INR: 2.05
Prothrombin Time: 23 seconds — ABNORMAL HIGH (ref 11.4–15.2)

## 2017-09-17 LAB — BRAIN NATRIURETIC PEPTIDE: B Natriuretic Peptide: 1399.3 pg/mL — ABNORMAL HIGH (ref 0.0–100.0)

## 2017-09-17 MED ORDER — ACETAMINOPHEN 500 MG PO TABS
1000.0000 mg | ORAL_TABLET | Freq: Four times a day (QID) | ORAL | Status: DC | PRN
Start: 2017-09-17 — End: 2017-09-21

## 2017-09-17 MED ORDER — ATORVASTATIN CALCIUM 40 MG PO TABS
40.0000 mg | ORAL_TABLET | ORAL | Status: DC
  Administered 2017-09-20: 40 mg via ORAL
  Filled 2017-09-17: qty 1

## 2017-09-17 MED ORDER — ALPRAZOLAM 0.5 MG PO TABS: 0.5000 mg | ORAL_TABLET | Freq: Two times a day (BID) | ORAL | Status: DC | PRN

## 2017-09-17 MED ORDER — SODIUM CHLORIDE 0.9% FLUSH: 3.0000 mL | INTRAVENOUS | Status: DC | PRN

## 2017-09-17 MED ORDER — TEMAZEPAM 15 MG PO CAPS
15.0000 mg | ORAL_CAPSULE | Freq: Every evening | ORAL | Status: DC | PRN
  Administered 2017-09-17 – 2017-09-20 (×4): 15 mg via ORAL
  Filled 2017-09-17 (×4): qty 1

## 2017-09-17 MED ORDER — FINASTERIDE 1 MG PO TABS: 1.0000 mg | ORAL_TABLET | Freq: Every day | ORAL | Status: DC

## 2017-09-17 MED ORDER — ONDANSETRON HCL 4 MG/2ML IJ SOLN: 4.0000 mg | Freq: Four times a day (QID) | INTRAMUSCULAR | Status: DC | PRN

## 2017-09-17 MED ORDER — CARVEDILOL 25 MG PO TABS: 25.0000 mg | ORAL_TABLET | Freq: Two times a day (BID) | ORAL | Status: DC

## 2017-09-17 MED ORDER — CARVEDILOL 25 MG PO TABS
25.0000 mg | ORAL_TABLET | Freq: Two times a day (BID) | ORAL | Status: DC
  Administered 2017-09-17 – 2017-09-21 (×8): 25 mg via ORAL
  Filled 2017-09-17 (×8): qty 1

## 2017-09-17 MED ORDER — SODIUM CHLORIDE 0.9 % IV SOLN
250.0000 mL | INTRAVENOUS | Status: DC | PRN
  Administered 2017-09-21: 10:00:00 via INTRAVENOUS

## 2017-09-17 MED ORDER — FUROSEMIDE 10 MG/ML IJ SOLN
80.0000 mg | Freq: Once | INTRAMUSCULAR | Status: AC
  Administered 2017-09-17: 80 mg via INTRAVENOUS
  Filled 2017-09-17: qty 8

## 2017-09-17 MED ORDER — POTASSIUM CHLORIDE CRYS ER 20 MEQ PO TBCR
20.0000 meq | EXTENDED_RELEASE_TABLET | Freq: Every day | ORAL | Status: DC
  Administered 2017-09-18 – 2017-09-19 (×2): 20 meq via ORAL
  Filled 2017-09-17: qty 1
  Filled 2017-09-17 (×3): qty 2

## 2017-09-17 MED ORDER — AMIODARONE LOAD VIA INFUSION
150.0000 mg | Freq: Once | INTRAVENOUS | Status: AC
  Administered 2017-09-17: 150 mg via INTRAVENOUS
  Filled 2017-09-17: qty 83.34

## 2017-09-17 MED ORDER — RIVAROXABAN 20 MG PO TABS
20.0000 mg | ORAL_TABLET | Freq: Every day | ORAL | Status: DC
  Administered 2017-09-17: 20 mg via ORAL
  Filled 2017-09-17: qty 1

## 2017-09-17 MED ORDER — POLYVINYL ALCOHOL 1.4 % OP SOLN
2.0000 [drp] | Freq: Three times a day (TID) | OPHTHALMIC | Status: DC | PRN
  Filled 2017-09-17: qty 15

## 2017-09-17 MED ORDER — AMIODARONE HCL IN DEXTROSE 360-4.14 MG/200ML-% IV SOLN
60.0000 mg/h | INTRAVENOUS | Status: DC
  Administered 2017-09-18 (×2): 30 mg/h via INTRAVENOUS
  Administered 2017-09-18 – 2017-09-21 (×8): 60 mg/h via INTRAVENOUS
  Filled 2017-09-17 (×11): qty 200

## 2017-09-17 MED ORDER — PANTOPRAZOLE SODIUM 40 MG PO TBEC
40.0000 mg | DELAYED_RELEASE_TABLET | Freq: Every day | ORAL | Status: DC
  Administered 2017-09-18 – 2017-09-21 (×4): 40 mg via ORAL
  Filled 2017-09-17 (×4): qty 1

## 2017-09-17 MED ORDER — AMIODARONE HCL IN DEXTROSE 360-4.14 MG/200ML-% IV SOLN
60.0000 mg/h | INTRAVENOUS | Status: AC
  Administered 2017-09-17 (×2): 60 mg/h via INTRAVENOUS
  Filled 2017-09-17 (×2): qty 200

## 2017-09-17 MED ORDER — SODIUM CHLORIDE 0.9% FLUSH
3.0000 mL | Freq: Two times a day (BID) | INTRAVENOUS | Status: DC
  Administered 2017-09-18 – 2017-09-21 (×6): 3 mL via INTRAVENOUS

## 2017-09-17 MED ORDER — FUROSEMIDE 10 MG/ML IJ SOLN
40.0000 mg | Freq: Once | INTRAMUSCULAR | Status: AC
  Administered 2017-09-17: 40 mg via INTRAVENOUS
  Filled 2017-09-17: qty 4

## 2017-09-17 NOTE — ED Notes (Signed)
Dinner tray reordered

## 2017-09-17 NOTE — ED Notes (Signed)
Amiodarone to change dose at midnight. 60 mg/hr for 6 hours and it was started at 1800

## 2017-09-17 NOTE — ED Notes (Signed)
Pt reports he does not want to be admitted to the hospital. Pt states "if all I have to do is pee, I can do that at home. Will notify EDP

## 2017-09-17 NOTE — Progress Notes (Signed)
Pt informed that our pharmacy dose not carry his dose of Finasteride and that he will need to have a family member bring his home medication. Will continue to monitor.

## 2017-09-17 NOTE — ED Notes (Signed)
ED Provider at bedside. 

## 2017-09-17 NOTE — ED Notes (Signed)
Pt O2 90-91% on RA. Pt placed on 1L [place]

## 2017-09-17 NOTE — ED Notes (Signed)
Verbal order from cardiology for low sodium diet tray. Tray ordered from service response.

## 2017-09-17 NOTE — ED Notes (Signed)
Cardiology PA at the bedside. Per PA, Dr Harrington Challenger to see pt as soon as possible.

## 2017-09-17 NOTE — H&P (Addendum)
HISTORY AND PHYSICAL    Patient ID: Travis [name]; [id number]; 05/31/1940   Admit date: 09/17/2017 Date of Consult: 09/17/2017  Primary Care Provider: [name], MD Primary Cardiologist: Travis Axe, MD  Primary Electrophysiologist: Travis [name]/Travis [name]   Patient Profile:   Travis [name] is a 77 y.o. male with a hx of PAF, and pulmonary edema in 07/2017 due to a fib with RVR and associated with acute respiratory failure, functionally bicuspid aortic valve mod AS and mild AR, CM with EF 30-35%, GERD, and failure of Travis [name] -amiodarone was started on 09/02/17 with plan for ablation 10/06/17 and cardiac CTA 10/01/17 who is being seen today for the evaluation of SOB with elevated HR at the request of ER Dr. Tomi [name].  History of Present Illness:   Travis [name] has a hx of PAF, and pulmonary edema in 07/2017 due to a fib with RVR and associated with acute respiratory failure, functionally bicuspid aortic valve mod AS and mild AR, CM with EF 30-35%, GERD, and failure of Travis [name] -amiodarone was started on 09/02/17 with plan for ablation 10/06/17 and cardiac CTA 10/01/17.  ? Hx of CABG but I do not find results.  He denies having a cath.  Last DCCV 08/14/17 and by 08/31/17 was back in a fib rate then was 140.  Amiodarone is at 200 mg BID.  CHA2DS2VASC 4  Pt now presents with SOB. Feeling weak.  His wt increased 1.5 lbs.  SOB began last pm.    EKG in office 09/02/17 with a fib rate of 110, nonspecific ST/T wave changes.  Today EKG a fib with PVCs and rate 83, no acute changes and Qtc is same.  BNP 1399 Troponin 0.02 Na 132, K+ 3.8, Cr 1.81 up from 08/19/17 at 1.12 to 0.93 Hgb 13.5, WBC 7.3, plts 161  CXR CHF.    Currently resting pretty flat with no complaints.  He would prefer to go home.         Past Medical History:  Diagnosis Date  . Arthritis   . Bicuspid aortic valve   . Cardiomyopathy -resolved    tachycardia-induced, EF 55% June 2009  . CHF (congestive heart failure) ([place])      JULY 2014  . GERD (gastroesophageal reflux disease)   . History of cardioversion 12/16/2012  . History of stomach ulcers   . Hypertension   . Left bundle branch block   . Persistent atrial fibrillation ([place])    multiple prior cardioversion  . Sinus bradycardia   . Status post clamping of cerebral aneurysm 80         Past Surgical History:  Procedure Laterality Date  . CARDIAC CATHETERIZATION    . CARDIOVERSION N/A 12/16/2012   Procedure: CARDIOVERSION;  Surgeon: Travis Perla, MD;  Location: [facility] ENDOSCOPY;  Service: Cardiovascular;  Laterality: N/A;  . CARDIOVERSION N/A 08/14/2017   Procedure: CARDIOVERSION;  Surgeon: Travis Hector, MD;  Location: [facility] ENDOSCOPY;  Service: Cardiovascular;  Laterality: N/A;  . CATARACT EXTRACTION  05/2015   bilateral  . cerebral anuersym post clips    . fractured left arm    . HERNIA REPAIR     lft  . INGUINAL HERNIA REPAIR  07/02/2011   Procedure: LAPAROSCOPIC INGUINAL HERNIA;  Surgeon: Travis Bowie, MD;  Location: [place];  Service: General;  Laterality: Left;  Laparoscopic left inguinal hernia repair and mesh  . left tendon repair     lft foot  . ROTATOR CUFF REPAIR     lf  .  TEE WITHOUT CARDIOVERSION N/A 08/14/2017   Procedure: TRANSESOPHAGEAL ECHOCARDIOGRAM (TEE);  Surgeon: Travis Hector, MD;  Location: [facility] ENDOSCOPY;  Service: Cardiovascular;  Laterality: N/A;  . TONSILLECTOMY    . TOTAL KNEE ARTHROPLASTY Bilateral 02/06/2014   Procedure: TOTAL KNEE BILATERAL;  Surgeon: Travis Pole, MD;  Location: [facility] ORS;  Service: Orthopedics;  Laterality: Bilateral;  . WRIST GANGLION EXCISION     lft     Home Medications:         Prior to Admission medications   Medication Sig Start Date End Date Taking? Authorizing Provider  acetaminophen (TYLENOL) 500 MG tablet Take 1,000 mg by mouth every 6 (six) hours as needed for mild pain or moderate pain. Reported on 08/01/2015   Yes [provider]   ALPRAZolam Duanne Moron) 0.5 MG tablet Take 1 tablet (0.5 mg total) by mouth 2 (two) times daily as needed for anxiety (separate by 8 hours from temazepam). 08/04/17  Yes Travis Olp, MD  amiodarone (PACERONE) 200 MG tablet TAKE 1 TABLET(200 MG) BY MOUTH TWICE DAILY 08/31/17  Yes Travis Needs, NP  atorvastatin (LIPITOR) 40 MG tablet Take 1 tablet (40 mg total) by mouth once a week. Patient taking differently: Take 40 mg by mouth every Sunday.  07/31/16  Yes Travis Olp, MD  carvedilol (COREG) 25 MG tablet Take 1 tablet (25 mg total) by mouth 2 (two) times daily with a meal. 08/16/17  Yes Travis [name], Bhavinkumar, PA  finasteride (PROPECIA) 1 MG tablet TAKE 1 TABLET(1 MG) BY MOUTH DAILY Patient taking differently: Take 1 mg by mouth once a day 04/20/17  Yes Travis Olp, MD  furosemide (LASIX) 40 MG tablet Take 1 tablet (40 mg total) by mouth daily. 08/17/17  Yes Travis [name], Bhavinkumar, PA  hydroxypropyl methylcellulose / hypromellose (ISOPTO TEARS / GONIOVISC) 2.5 % ophthalmic solution Place 2 drops into both eyes 3 (three) times daily as needed for dry eyes.   Yes [provider]  meloxicam (MOBIC) 15 MG tablet TAKE 1 TABLET BY MOUTH DAILY Patient taking differently: Take 15 mg by mouth once a day 07/10/17  Yes Travis Olp, MD  omeprazole (PRILOSEC) 20 MG capsule TAKE 1 CAPSULE BY MOUTH DAILY Patient taking differently: Take 20 mg by mouth once a day 08/03/17  Yes Travis Banister, MD  potassium chloride (K-DUR) 10 MEQ tablet Take 1 tablet (10 mEq total) by mouth daily. 08/16/17  Yes Travis [name], Bhavinkumar, PA  rivaroxaban (XARELTO) 20 MG TABS tablet Take 1 tablet (20 mg total) by mouth daily with supper. 03/24/17  Yes Travis Needs, NP  temazepam (RESTORIL) 15 MG capsule TAKE ONE CAPSULE BY MOUTH AT BEDTIME AS NEEDED Patient taking differently: Take 15 mg by mouth at bedtime as needed for sleep 07/27/17  Yes Travis Olp, MD    Inpatient Medications: Scheduled  Meds:  Continuous Infusions:  PRN Meds:   Allergies:   No Known Allergies  Social History:   Social History        Socioeconomic History  . Marital status: Single    Spouse name: Not on file  . Number of children: 0  . Years of education: Not on file  . Highest education level: Not on file  Occupational History  . Occupation: Retired  Scientific laboratory technician  . Financial resource strain: Not on file  . Food insecurity:    Worry: Not on file    Inability: Not on file  . Transportation [name]:    Medical: Not on file  Non-medical: Not on file  Tobacco Use  . Smoking status: Former Smoker    Packs/day: 1.50    Years: 25.00    Pack years: 37.50    Last attempt to quit: 05/26/1986    Years since quitting: 31.3  . Smokeless tobacco: Never Used  Substance and Sexual Activity  . Alcohol use: Yes    Alcohol/week: 0.5 oz    Types: 1 Standard drinks or equivalent per week    Comment: stopped drinking   . Drug use: No  . Sexual activity: Not Currently  Lifestyle  . Physical activity:    Days per week: Not on file    Minutes per session: Not on file  . Stress: Not on file  Relationships  . Social connections:    Talks on phone: Not on file    Gets together: Not on file    Attends religious service: Not on file    Active member of club or organization: Not on file    Attends meetings of clubs or organizations: Not on file    Relationship status: Not on file  . Intimate partner violence:    Fear of current or ex partner: Not on file    Emotionally abused: Not on file    Physically abused: Not on file    Forced sexual activity: Not on file  Other Topics Concern  . Not on file  Social History Narrative   Homosexual. Lives alone. Not sexually active.       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 Stroke Father 32       smoker  . Lung cancer Mother 55       former smoker  . Stroke Mother   . Stroke Brother   . Colon cancer Neg Hx      ROS:  Please see the history of present illness.  General:no colds or fevers, no weight changes Skin:no rashes or ulcers HEENT:no blurred vision, no congestion CV:see HPI PUL:see HPI GI:no diarrhea constipation or melena, no indigestion GU:no hematuria, no dysuria MS:no joint pain, no claudication Neuro:no syncope, no lightheadedness Endo:no diabetes, no thyroid disease  All other ROS reviewed and negative.     Physical Exam/Data:         Vitals:   09/17/17 1530 09/17/17 1545 09/17/17 1600 09/17/17 1615  BP: (!) 135/91 120/90 133/89 (!) 128/98  Pulse: 100 85 (!) 53 (!) 106  Resp: (!) 29 (!) 27 (!) 23 (!) 26  Temp:      TempSrc:      SpO2: 95% 94% 94% 91%    Intake/Output Summary (Last 24 hours) at 09/17/2017 1626 Last data filed at 09/17/2017 1552    Gross per 24 hour  Intake -  Output 1000 ml  Net -1000 ml   There were no vitals filed for this visit. There is no height or weight on file to calculate BMI.  General:  Well nourished, well developed, in no acute distress, resting in bed HEENT: normal Lymph: no adenopathy Neck: +  JVD mild Endocrine:  No thryomegaly Vascular: No carotid bruits; FA pulses 2+ bilaterally without bruits  Cardiac:  normal S1, S2; RRR; + systolic murmur no gallup rub or click Lungs:  Few rales to auscultation bilaterally, no wheezing,  rhonchi or rales  Abd: soft, nontender, no hepatomegaly  Ext: no edema Musculoskeletal:  No deformities, BUE and BLE strength normal and equal Skin: warm and dry  Neuro:  Alert and oriented X 3 no focal abnormalities noted Psych:  Normal affect    Relevant CV Studies: Echo 08/13/17 Study Conclusions  - Left ventricle: The cavity size was normal. Wall thickness was increased in a  pattern of moderate LVH. Systolic function was moderately to severely reduced. The estimated ejection fraction was in the range of 30% to 35%. - Aortic valve: AV is thickened, calcified with restricted motion. Peak and mean gradients through the valve are 16 and 11 mm Hg respectively. Dimensionless index is 0.48. 2 D imaging suggests moderate AS. There was mild regurgitation. - Left atrium: The atrium was severely dilated. - Right ventricle: The cavity size was mildly dilated. Systolic function was mildly to moderately reduced. - Pulmonary arteries: PA peak pressure: 46 mm Hg (S). Study Conclusions  - Left ventricle: The estimated ejection fraction was 30%. Diffuse hypokinesis. No evidence of thrombus. - Aortic valve: There was moderate stenosis. There was mild regurgitation. - Left atrium: The atrium was severely dilated. No evidence of thrombus in the atrial cavity or appendage. No evidence of thrombus in the atrial cavity or appendage. - Atrial septum: No defect or patent foramen ovale was identified. - Impressions: 3D rendering of MV performed. Anesthesia propofol DCC x 1 150 J converted from afib rate 120 to SB rates 55 No immediate neurologic sequelae.  Impressions:  - 3D rendering of MV performed. Anesthesia propofol DCC x 1 150 J converted from afib rate 120 to SB rates 55 No immediate neurologic sequelae. Successful cardioversion. No cardiac source of emboli was indentified.  Laboratory Data:  Chemistry LastLabs     Recent Labs  Lab 09/17/17 0846  NA 132*  K 3.8  CL 102  CO2 18*  GLUCOSE 217*  BUN 49*  CREATININE 1.81*  CALCIUM 8.9  GFRNONAA 34*  GFRAA 40*  ANIONGAP 12      LastLabs  No results for input(s): PROT, ALBUMIN, AST, ALT, ALKPHOS, BILITOT in the last 168 hours.   Hematology LastLabs     Recent Labs  Lab 09/17/17 0846  WBC 7.3  RBC 4.04*  HGB 13.5  HCT 39.7  MCV 98.3  MCH 33.4  MCHC 34.0    RDW 14.4  PLT 161     Cardiac Enzymes LastLabs  No results for input(s): TROPONINI in the last 168 hours.    LastLabs     Recent Labs  Lab 09/17/17 0915  TROPIPOC 0.02      BNP LastLabs     Recent Labs  Lab 09/17/17 1313  BNP 1,399.3*      DDimer  LastLabs  No results for input(s): DDIMER in the last 168 hours.    Radiology/Studies:  Dg Chest 2 View  Result Date: 09/17/2017 CLINICAL DATA:  Shortness of breath EXAM: CHEST - 2 VIEW COMPARISON:  08/12/2017 FINDINGS: Chronic cardiomegaly. Generalized interstitial coarsening with trace pleural fluid. Extent is less severe than on prior. No pneumothorax or focal consolidation. Artifact from EKG leads. IMPRESSION: CHF pattern. Electronically Signed   By: Monte Fantasia M.D.   On: 09/17/2017 09:25    Assessment and Plan:   1. Acute systolic HF with + CXR and elevated BNP no chest pain.  Combination has been back in a fib since at least 08/31/17, rate uncontrolled at times, tikosyn stopped and now on amiodarone 200  BID  He has rec'd 40 mg IV lasix here. New decrease in EF thought due to RVR, unknown CAD status and does have moderate AS. Has rec'd lasix 40 here and is voiding.  Will admit and diuresis.  Dr. Harrington Challenger is seeing.   2.       AKI new ? Due to acute HF  He is on lasix 40 daily at home.    3.        AS, Bicuspid AS moderate stenosis on TEE in 07/2017.  4.        Cardiomyopathy  EF 30% - for cardiac CTA to eval for CAD.  Pt is withoutchest pain.     For questions or updates, please contact [place] Please consult [email or web address] for contact info under Cardiology/STEMI.   Signed, Cecilie Kicks, NP  09/17/2017 at 4:26   Patient seen and examined   I agree with findings as noted by L Dorene Ar above Pt is a 77 yo who has a hx of atral fibrillation for over 10 years   Initially Rx with Tikosyn   Failed  Did not tolerate ranolazine due to dizziness   L atrium large   Referred to J Travis [name]  Seen on  09/02/17  Pt on amiodarone   Plan for Afib ablation    Note LVEF declined.  In 08/2016 LVEF normal  Now 30 to 35%  I have reviewed TEE, LVEF looks worse  The pt presents today with increased SOB and fatigue  ON exam,  BP 110s  HR 100s to 110s (afib) Neck:  JVP normal   Lungs  Rales at bases  Cardiac exam:   Irreg irreg   No S3   Ext with no edema  Labs signif for Cr 1.8 which was up from 0.8 1 month ago  Plan:   Admit   Diurese and follow renal function IV amiodarone to get better rate control   Probably tachy induced cardiolmyopathy    Dorris Carnes    Start amiodarone

## 2017-09-17 NOTE — ED Provider Notes (Signed)
[facility] EMERGENCY DEPARTMENT Provider Note   CSN: [id number] Arrival date & time: 09/17/17  8295     History   Chief Complaint Chief Complaint  Patient presents with  . Shortness of Breath    HPI Travis [name] is a 77 y.o. male.  HPI Pt has been in a fib for the last month.  Pt was told he may have trouble with his breathing again.  He is waiting to have an ablation next month.  Pt noticed shortness of breath last night and it became worse if he tried to lie down.  He also feels more short of breath when he walks.  His weight is up 1 lb this morning.  He is concerned that he has more fluid on the lungs again.  He came to the ED because he had significant trouble the last time he was in the hospital.  Past Medical History:  Diagnosis Date  . Arthritis   . Bicuspid aortic valve   . Cardiomyopathy -resolved    tachycardia-induced, EF 55% June 2009  . CHF (congestive heart failure) ([place])    JULY 2014  . GERD (gastroesophageal reflux disease)   . History of cardioversion 12/16/2012  . History of stomach ulcers   . Hypertension   . Left bundle branch block   . Persistent atrial fibrillation ([place])    multiple prior cardioversion  . Sinus bradycardia   . Status post clamping of cerebral aneurysm 79    Patient Active Problem List   Diagnosis Date Noted  . Community acquired pneumonia of left lower lobe of lung ([place])   . Acute respiratory failure with hypoxia ([place]) 08/12/2017  . Acute on chronic combined systolic and diastolic CHF (congestive heart failure) ([place]) 08/12/2017  . Atrial fibrillation with RVR ([place]) 08/12/2017  . GERD (gastroesophageal reflux disease) 08/12/2017  . Aortic valve, bicuspid 08/12/2017  . Acute hyponatremia 08/12/2017  . Macrocytic anemia 08/01/2015  . Hyperlipidemia 12/19/2014  . Male pattern baldness 03/06/2014  . Anxiety state 03/06/2014  . Insomnia 03/06/2014  . Overweight (BMI 25.0-29.9) 02/08/2014  . S/P TKR (total knee  replacement) 02/08/2014  . Sinus bradycardia 01/04/2013  . Pulmonary hypertension ([place]) 03/09/2012  . Hyponatremia 02/21/2012  . Bicuspid aortic valve   . Paroxysmal atrial fibrillation ([facility])   . Hypertension   . Cardiomyopathy, rate related with resolution now recurrent 12/08/2011  . Left bundle branch block 12/10/2010  . DRY MOUTH 11/07/2009  . Carotid bruit 07/05/2009  . GERD 01/07/2008  . Osteoarthritis 02/04/2007  . GANGLION CYST, WRIST, LEFT 02/04/2007    Past Surgical History:  Procedure Laterality Date  . CARDIAC CATHETERIZATION    . CARDIOVERSION N/A 12/16/2012   Procedure: CARDIOVERSION;  Surgeon: Lelon Perla, MD;  Location: [facility] ENDOSCOPY;  Service: Cardiovascular;  Laterality: N/A;  . CARDIOVERSION N/A 08/14/2017   Procedure: CARDIOVERSION;  Surgeon: Josue Hector, MD;  Location: [facility] ENDOSCOPY;  Service: Cardiovascular;  Laterality: N/A;  . CATARACT EXTRACTION  05/2015   bilateral  . cerebral anuersym post clips    . fractured left arm    . HERNIA REPAIR     lft  . INGUINAL HERNIA REPAIR  07/02/2011   Procedure: LAPAROSCOPIC INGUINAL HERNIA;  Surgeon: Harl Bowie, MD;  Location: [facility];  Service: General;  Laterality: Left;  Laparoscopic left inguinal hernia repair and mesh  . left tendon repair     lft foot  . ROTATOR CUFF REPAIR     lf  .  TEE WITHOUT CARDIOVERSION N/A 08/14/2017   Procedure: TRANSESOPHAGEAL ECHOCARDIOGRAM (TEE);  Surgeon: Josue Hector, MD;  Location: [facility] ENDOSCOPY;  Service: Cardiovascular;  Laterality: N/A;  . TONSILLECTOMY    . TOTAL KNEE ARTHROPLASTY Bilateral 02/06/2014   Procedure: TOTAL KNEE BILATERAL;  Surgeon: Mauri Pole, MD;  Location: [facility] ORS;  Service: Orthopedics;  Laterality: Bilateral;  . WRIST GANGLION EXCISION     lft        Home Medications    Prior to Admission medications   Medication Sig Start Date End Date Taking? Authorizing Provider  acetaminophen (TYLENOL) 500 MG tablet Take 1,000 mg by mouth every 6  (six) hours as needed for mild pain or moderate pain. Reported on 08/01/2015    [provider]  ALPRAZolam Duanne Moron) 0.5 MG tablet Take 1 tablet (0.5 mg total) by mouth 2 (two) times daily as needed for anxiety (separate by 8 hours from temazepam). 08/04/17   [name], MD  amiodarone (PACERONE) 200 MG tablet TAKE 1 TABLET(200 MG) BY MOUTH TWICE DAILY 08/31/17   Sherran Needs, NP  atorvastatin (LIPITOR) 40 MG tablet Take 1 tablet (40 mg total) by mouth once a week. Patient taking differently: Take 40 mg by mouth every Sunday.  07/31/16   [name], MD  b complex vitamins tablet Take 1 tablet by mouth daily.     [provider]  carvedilol (COREG) 25 MG tablet Take 1 tablet (25 mg total) by mouth 2 (two) times daily with a meal. 08/16/17   Bhagat, Bhavinkumar, PA  finasteride (PROPECIA) 1 MG tablet TAKE 1 TABLET(1 MG) BY MOUTH DAILY Patient taking differently: Take 1 mg by mouth once a day 04/20/17   [name], MD  fish oil-omega-3 fatty acids 1000 MG capsule Take 1,000 mg by mouth daily.     [provider]  Flaxseed, Linseed, (RA FLAX SEED OIL 1000 PO) Take 1,000 mg by mouth 3 (three) times daily.     [provider]  furosemide (LASIX) 40 MG tablet Take 1 tablet (40 mg total) by mouth daily. 08/17/17   Bhagat, Crista Luria, PA  hydroxypropyl methylcellulose / hypromellose (ISOPTO TEARS / GONIOVISC) 2.5 % ophthalmic solution Place 2 drops into both eyes 3 (three) times daily as needed for dry eyes.    [provider]  meloxicam (MOBIC) 15 MG tablet TAKE 1 TABLET BY MOUTH DAILY Patient taking differently: Take 15 mg by mouth once a day 07/10/17   [name], MD  Multiple Vitamins-Minerals (ICAPS AREDS 2) CAPS Take 1 capsule by mouth 2 (two) times daily.     [provider]  omeprazole (PRILOSEC) 20 MG capsule TAKE 1 CAPSULE BY MOUTH DAILY Patient taking differently: Take 20 mg by mouth once a day 08/03/17   Milus Banister,  MD  potassium chloride (K-DUR) 10 MEQ tablet Take 1 tablet (10 mEq total) by mouth daily. 08/16/17   Bhagat, Crista Luria, PA  rivaroxaban (XARELTO) 20 MG TABS tablet Take 1 tablet (20 mg total) by mouth daily with supper. 03/24/17   Sherran Needs, NP  temazepam (RESTORIL) 15 MG capsule TAKE ONE CAPSULE BY MOUTH AT BEDTIME AS NEEDED Patient taking differently: Take 15 mg by mouth at bedtime as needed for sleep 07/27/17   [name], MD    Family History Family History  Problem Relation Age of Onset  . Stroke Father 39       smoker  . Lung cancer Mother 27  former smoker  . Stroke Mother   . Stroke Brother   . Colon cancer Neg Hx     Social History Social History   Tobacco Use  . Smoking status: Former Smoker    Packs/day: 1.50    Years: 25.00    Pack years: 37.50    Last attempt to quit: 05/26/1986    Years since quitting: 31.3  . Smokeless tobacco: Never Used  Substance Use Topics  . Alcohol use: Yes    Alcohol/week: 0.5 oz    Types: 1 Standard drinks or equivalent per week    Comment: stopped drinking   . Drug use: No     Allergies   Patient has no known allergies.   Review of Systems Review of Systems  Constitutional: Negative for fever.  Respiratory: Positive for cough.   All other systems reviewed and are negative.    Physical Exam Updated Vital Signs BP 124/84 (BP Location: Left Arm)   Pulse 89   Temp 98 F (36.7 C) (Oral)   Resp 16   SpO2 91%   Physical Exam  Constitutional:  Non-toxic appearance. He does not appear ill. No distress.  HENT:  Head: Normocephalic and atraumatic.  Right Ear: External ear normal.  Left Ear: External ear normal.  Eyes: Conjunctivae are normal. Right eye exhibits no discharge. Left eye exhibits no discharge. No scleral icterus.  Neck: Neck supple. No tracheal deviation present.  Cardiovascular: Normal rate, regular rhythm and intact distal pulses.  Pulmonary/Chest: Effort normal. No stridor. No  respiratory distress. He has no wheezes. He has rales ( at bases).  Abdominal: Soft. Bowel sounds are normal. He exhibits no distension. There is no tenderness. There is no rebound and no guarding.  Musculoskeletal: He exhibits no edema or tenderness.  Neurological: He is alert. He has normal strength. No cranial nerve deficit (no facial droop, extraocular movements intact, no slurred speech) or sensory deficit. He exhibits normal muscle tone. He displays no seizure activity. Coordination normal.  Skin: Skin is warm and dry. No rash noted.  Psychiatric: He has a normal mood and affect.  Nursing note and vitals reviewed.    ED Treatments / Results  Labs (all labs ordered are listed, but only abnormal results are displayed) Labs Reviewed  BASIC METABOLIC PANEL - Abnormal; Notable for the following components:      Result Value   Sodium 132 (*)    CO2 18 (*)    Glucose, Bld 217 (*)    BUN 49 (*)    Creatinine, Ser 1.81 (*)    GFR calc non Af Amer 34 (*)    GFR calc Af Amer 40 (*)    All other components within normal limits  CBC - Abnormal; Notable for the following components:   RBC 4.04 (*)    All other components within normal limits  I-STAT TROPONIN, ED    EKG EKG Interpretation  Date/Time:  Thursday September 17 2017 08:39:57 EDT Ventricular Rate:  83 PR Interval:    QRS Duration: 144 QT Interval:  404 QTC Calculation: 474 R Axis:   -49 Text Interpretation:  Atrial fibrillation with premature ventricular or aberrantly conducted complexes Left axis deviation Left bundle branch block Abnormal ECG Since last tracing rate slower Confirmed by Dorie Rank [phone]) on 09/17/2017 12:02:07 PM   Radiology Dg Chest 2 View  Result Date: 09/17/2017 CLINICAL DATA:  Shortness of breath EXAM: CHEST - 2 VIEW COMPARISON:  08/12/2017 FINDINGS: Chronic cardiomegaly. Generalized interstitial coarsening with trace  pleural fluid. Extent is less severe than on prior. No pneumothorax or focal  consolidation. Artifact from EKG leads. IMPRESSION: CHF pattern. Electronically Signed   By: Monte Fantasia M.D.   On: 09/17/2017 09:25    Procedures Procedures (including critical care time)  Medications Ordered in ED Medications - No data to display   Initial Impression / Assessment and Plan / ED Course  I have reviewed the triage vital signs and the nursing notes.  Pertinent labs & imaging results that were available during my care of the patient were reviewed by me and considered in my medical decision making (see chart for details).  Clinical Course as of Sep 17 2216  Thu Sep 17, 2017  1236 Renal function is worsening.  Troponin is normal.   [JK]  1248 D/w Cardiology.  They will see patient in the ED.   [JK]  1403 BNP significantly elevated compared to last   [JK]    Clinical Course User Index [JK] Dorie Rank, MD  Patient has a history of persistent atrial fibrillation.  Previous attempts to cardiovert him have been unsuccessful.  Patient is scheduled for an ablation.  Patient started having worsening shortness of breath.  He feels like he is having recurrent issues with his pulmonary edema.  Chest x-ray is consistent with congestive heart failure.  Patient's baseline oxygen saturation is normal and is not tachypneic at rest however when he tries to walk around to become short of breath.  I will order him a dose of Lasix.  Concerned however that his renal function appears to be worsening.  He may need to be monitored closely as we diurese him.  I will consult with the cardiology service to discuss whether it be best to manage him as an inpatient versus closely as an outpatient Final Clinical Impressions(s) / ED Diagnoses   Final diagnoses:  None    ED Discharge Orders    None       Dorie Rank, MD 09/17/17 2219

## 2017-09-17 NOTE — Consult Note (Deleted)
Cardiology Consultation:   Patient ID: Travis [name]; [id number]; 1940-12-28   Admit date: 09/17/2017 Date of Consult: 09/17/2017  Primary Care Provider: [name], MD Primary Cardiologist: Virl Axe, MD  Primary Electrophysiologist: Klein/Allred   Patient Profile:   Travis [name] is a 77 y.o. male with a hx of PAF, and pulmonary edema in 07/2017 due to a fib with RVR and associated with acute respiratory failure, functionally bicuspid aortic valve mod AS and mild AR, CM with EF 30-35%, GERD, and failure of Phyllis Ginger -amiodarone was started on 09/02/17 with plan for ablation 10/06/17 and cardiac CTA 10/01/17 who is being seen today for the evaluation of SOB with elevated HR at the request of ER Dr. Tomi Bamberger.  History of Present Illness:   [name] has a hx of PAF, and pulmonary edema in 07/2017 due to a fib with RVR and associated with acute respiratory failure, functionally bicuspid aortic valve mod AS and mild AR, CM with EF 30-35%, GERD, and failure of Phyllis Ginger -amiodarone was started on 09/02/17 with plan for ablation 10/06/17 and cardiac CTA 10/01/17.  ? Hx of CABG but I do not find results.  He denies having a cath.  Last DCCV 08/14/17 and by 08/31/17 was back in a fib rate then was 140.  Amiodarone is at 200 mg BID.  CHA2DS2VASC 4  Pt now presents with SOB. Feeling weak.  His wt increased 1.5 lbs.  SOB began last pm.    EKG in office 09/02/17 with a fib rate of 110, nonspecific ST/T wave changes.  Today EKG a fib with PVCs and rate 83, no acute changes and Qtc is same.  BNP 1399 Troponin 0.02 Na 132, K+ 3.8, Cr 1.81 up from 08/19/17 at 1.12 to 0.93 Hgb 13.5, WBC 7.3, plts 161  CXR CHF.    Currently resting pretty flat with no complaints.  He would prefer to go home.     Past Medical History:  Diagnosis Date  . Arthritis   . Bicuspid aortic valve   . Cardiomyopathy -resolved    tachycardia-induced, EF 55% June 2009  . CHF (congestive heart failure) ([place])    JULY 2014  . GERD  (gastroesophageal reflux disease)   . History of cardioversion 12/16/2012  . History of stomach ulcers   . Hypertension   . Left bundle branch block   . Persistent atrial fibrillation ([place])    multiple prior cardioversion  . Sinus bradycardia   . Status post clamping of cerebral aneurysm 80    Past Surgical History:  Procedure Laterality Date  . CARDIAC CATHETERIZATION    . CARDIOVERSION N/A 12/16/2012   Procedure: CARDIOVERSION;  Surgeon: Lelon Perla, MD;  Location: [facility] ENDOSCOPY;  Service: Cardiovascular;  Laterality: N/A;  . CARDIOVERSION N/A 08/14/2017   Procedure: CARDIOVERSION;  Surgeon: Josue Hector, MD;  Location: [facility] ENDOSCOPY;  Service: Cardiovascular;  Laterality: N/A;  . CATARACT EXTRACTION  05/2015   bilateral  . cerebral anuersym post clips    . fractured left arm    . HERNIA REPAIR     lft  . INGUINAL HERNIA REPAIR  07/02/2011   Procedure: LAPAROSCOPIC INGUINAL HERNIA;  Surgeon: Harl Bowie, MD;  Location: [place];  Service: General;  Laterality: Left;  Laparoscopic left inguinal hernia repair and mesh  . left tendon repair     lft foot  . ROTATOR CUFF REPAIR     lf  . TEE WITHOUT CARDIOVERSION N/A 08/14/2017   Procedure: TRANSESOPHAGEAL ECHOCARDIOGRAM (TEE);  Surgeon: Josue Hector, MD;  Location: [facility] ENDOSCOPY;  Service: Cardiovascular;  Laterality: N/A;  . TONSILLECTOMY    . TOTAL KNEE ARTHROPLASTY Bilateral 02/06/2014   Procedure: TOTAL KNEE BILATERAL;  Surgeon: Mauri Pole, MD;  Location: [facility] ORS;  Service: Orthopedics;  Laterality: Bilateral;  . WRIST GANGLION EXCISION     lft     Home Medications:  Prior to Admission medications   Medication Sig Start Date End Date Taking? Authorizing Provider  acetaminophen (TYLENOL) 500 MG tablet Take 1,000 mg by mouth every 6 (six) hours as needed for mild pain or moderate pain. Reported on 08/01/2015   Yes [provider]  ALPRAZolam Duanne Moron) 0.5 MG tablet Take 1 tablet (0.5 mg total) by mouth 2 (two)  times daily as needed for anxiety (separate by 8 hours from temazepam). 08/04/17  Yes [name], MD  amiodarone (PACERONE) 200 MG tablet TAKE 1 TABLET(200 MG) BY MOUTH TWICE DAILY 08/31/17  Yes Sherran Needs, NP  atorvastatin (LIPITOR) 40 MG tablet Take 1 tablet (40 mg total) by mouth once a week. Patient taking differently: Take 40 mg by mouth every Sunday.  07/31/16  Yes [name], MD  carvedilol (COREG) 25 MG tablet Take 1 tablet (25 mg total) by mouth 2 (two) times daily with a meal. 08/16/17  Yes Bhagat, Bhavinkumar, PA  finasteride (PROPECIA) 1 MG tablet TAKE 1 TABLET(1 MG) BY MOUTH DAILY Patient taking differently: Take 1 mg by mouth once a day 04/20/17  Yes [name], MD  furosemide (LASIX) 40 MG tablet Take 1 tablet (40 mg total) by mouth daily. 08/17/17  Yes Bhagat, Bhavinkumar, PA  hydroxypropyl methylcellulose / hypromellose (ISOPTO TEARS / GONIOVISC) 2.5 % ophthalmic solution Place 2 drops into both eyes 3 (three) times daily as needed for dry eyes.   Yes [provider]  meloxicam (MOBIC) 15 MG tablet TAKE 1 TABLET BY MOUTH DAILY Patient taking differently: Take 15 mg by mouth once a day 07/10/17  Yes [name], MD  omeprazole (PRILOSEC) 20 MG capsule TAKE 1 CAPSULE BY MOUTH DAILY Patient taking differently: Take 20 mg by mouth once a day 08/03/17  Yes Milus Banister, MD  potassium chloride (K-DUR) 10 MEQ tablet Take 1 tablet (10 mEq total) by mouth daily. 08/16/17  Yes Bhagat, Bhavinkumar, PA  rivaroxaban (XARELTO) 20 MG TABS tablet Take 1 tablet (20 mg total) by mouth daily with supper. 03/24/17  Yes Sherran Needs, NP  temazepam (RESTORIL) 15 MG capsule TAKE ONE CAPSULE BY MOUTH AT BEDTIME AS NEEDED Patient taking differently: Take 15 mg by mouth at bedtime as needed for sleep 07/27/17  Yes [name], MD    Inpatient Medications: Scheduled Meds:  Continuous Infusions:  PRN Meds:   Allergies:   No Known Allergies  Social  History:   Social History   Socioeconomic History  . Marital status: Single    Spouse name: Not on file  . Number of children: 0  . Years of education: Not on file  . Highest education level: Not on file  Occupational History  . Occupation: Retired  Scientific laboratory technician  . Financial resource strain: Not on file  . Food insecurity:    Worry: Not on file    Inability: Not on file  . Transportation needs:    Medical: Not on file    Non-medical: Not on file  Tobacco Use  . Smoking status: Former Smoker    Packs/day: 1.50  Years: 25.00    Pack years: 37.50    Last attempt to quit: 05/26/1986    Years since quitting: 31.3  . Smokeless tobacco: Never Used  Substance and Sexual Activity  . Alcohol use: Yes    Alcohol/week: 0.5 oz    Types: 1 Standard drinks or equivalent per week    Comment: stopped drinking   . Drug use: No  . Sexual activity: Not Currently  Lifestyle  . Physical activity:    Days per week: Not on file    Minutes per session: Not on file  . Stress: Not on file  Relationships  . Social connections:    Talks on phone: Not on file    Gets together: Not on file    Attends religious service: Not on file    Active member of club or organization: Not on file    Attends meetings of clubs or organizations: Not on file    Relationship status: Not on file  . Intimate partner violence:    Fear of current or ex partner: Not on file    Emotionally abused: Not on file    Physically abused: Not on file    Forced sexual activity: Not on file  Other Topics Concern  . Not on file  Social History Narrative   Homosexual. Lives alone. Not sexually active.       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 Stroke Father 10       smoker  . Lung cancer Mother 44       former smoker  . Stroke Mother   . Stroke  Brother   . Colon cancer Neg Hx      ROS:  Please see the history of present illness.  General:no colds or fevers, no weight changes Skin:no rashes or ulcers HEENT:no blurred vision, no congestion CV:see HPI PUL:see HPI GI:no diarrhea constipation or melena, no indigestion GU:no hematuria, no dysuria MS:no joint pain, no claudication Neuro:no syncope, no lightheadedness Endo:no diabetes, no thyroid disease  All other ROS reviewed and negative.     Physical Exam/Data:   Vitals:   09/17/17 1530 09/17/17 1545 09/17/17 1600 09/17/17 1615  BP: (!) 135/91 120/90 133/89 (!) 128/98  Pulse: 100 85 (!) 53 (!) 106  Resp: (!) 29 (!) 27 (!) 23 (!) 26  Temp:      TempSrc:      SpO2: 95% 94% 94% 91%    Intake/Output Summary (Last 24 hours) at 09/17/2017 1626 Last data filed at 09/17/2017 1552 Gross per 24 hour  Intake -  Output 1000 ml  Net -1000 ml   There were no vitals filed for this visit. There is no height or weight on file to calculate BMI.  General:  Well nourished, well developed, in no acute distress, resting in bed HEENT: normal Lymph: no adenopathy Neck: +  JVD mild Endocrine:  No thryomegaly Vascular: No carotid bruits; FA pulses 2+ bilaterally without bruits  Cardiac:  normal S1, S2; RRR; + systolic murmur no gallup rub or click Lungs:  Few rales to auscultation bilaterally, no wheezing, rhonchi or rales  Abd: soft, nontender, no hepatomegaly  Ext: no edema Musculoskeletal:  No deformities, BUE and BLE strength normal and equal Skin: warm and dry  Neuro:  Alert and oriented X  3 no focal abnormalities noted Psych:  Normal affect    Relevant CV Studies: Echo 08/13/17 Study Conclusions  - Left ventricle: The cavity size was normal. Wall thickness was   increased in a pattern of moderate LVH. Systolic function was   moderately to severely reduced. The estimated ejection fraction   was in the range of 30% to 35%. - Aortic valve: AV is thickened, calcified with  restricted motion.   Peak and mean gradients through the valve are 16 and 11 mm Hg   respectively. Dimensionless index is 0.48. 2 D imaging suggests   moderate AS. There was mild regurgitation. - Left atrium: The atrium was severely dilated. - Right ventricle: The cavity size was mildly dilated. Systolic   function was mildly to moderately reduced. - Pulmonary arteries: PA peak pressure: 46 mm Hg (S). Study Conclusions  - Left ventricle: The estimated ejection fraction was 30%. Diffuse   hypokinesis. No evidence of thrombus. - Aortic valve: There was moderate stenosis. There was mild   regurgitation. - Left atrium: The atrium was severely dilated. No evidence of   thrombus in the atrial cavity or appendage. No evidence of   thrombus in the atrial cavity or appendage. - Atrial septum: No defect or patent foramen ovale was identified. - Impressions: 3D rendering of MV performed. Anesthesia propofol   DCC x 1 150 J converted from afib rate 120 to SB rates 55   No immediate neurologic sequelae.  Impressions:  - 3D rendering of MV performed. Anesthesia propofol   DCC x 1 150 J converted from afib rate 120 to SB rates 55   No immediate neurologic sequelae. Successful cardioversion. No   cardiac source of emboli was indentified.  Laboratory Data:  Chemistry Recent Labs  Lab 09/17/17 0846  NA 132*  K 3.8  CL 102  CO2 18*  GLUCOSE 217*  BUN 49*  CREATININE 1.81*  CALCIUM 8.9  GFRNONAA 34*  GFRAA 40*  ANIONGAP 12    No results for input(s): PROT, ALBUMIN, AST, ALT, ALKPHOS, BILITOT in the last 168 hours. Hematology Recent Labs  Lab 09/17/17 0846  WBC 7.3  RBC 4.04*  HGB 13.5  HCT 39.7  MCV 98.3  MCH 33.4  MCHC 34.0  RDW 14.4  PLT 161   Cardiac EnzymesNo results for input(s): TROPONINI in the last 168 hours.  Recent Labs  Lab 09/17/17 0915  TROPIPOC 0.02    BNP Recent Labs  Lab 09/17/17 1313  BNP 1,399.3*    DDimer No results for input(s): DDIMER in  the last 168 hours.  Radiology/Studies:  Dg Chest 2 View  Result Date: 09/17/2017 CLINICAL DATA:  Shortness of breath EXAM: CHEST - 2 VIEW COMPARISON:  08/12/2017 FINDINGS: Chronic cardiomegaly. Generalized interstitial coarsening with trace pleural fluid. Extent is less severe than on prior. No pneumothorax or focal consolidation. Artifact from EKG leads. IMPRESSION: CHF pattern. Electronically Signed   By: Monte Fantasia M.D.   On: 09/17/2017 09:25    Assessment and Plan:   1. Acute systolic HF with + CXR and elevated BNP no chest pain.  Combination has been back in a fib since at least 08/31/17, rate uncontrolled at times, tikosyn stopped and now on amiodarone 200 BID  He has rec'd 40 mg IV lasix here. New decrease in EF thought due to RVR, unknown CAD status and does have moderate AS. Has rec'd lasix 40 here and is voiding.  Will admit and diuresis.  Dr. Harrington Challenger is seeing.  2.       AKI new ? Due to acute HF  He is on lasix 40 daily at home.    3.        AS, Bicuspid AS moderate stenosis on TEE in 07/2017.  4.        Cardiomyopathy  EF 30% - for cardiac CTA to eval for ischemia but no chest pain.     For questions or updates, please contact [place] Please consult [email or web address] for contact info under Cardiology/STEMI.   Signed, Cecilie Kicks, NP  09/17/2017 4:26 PM

## 2017-09-17 NOTE — ED Triage Notes (Signed)
Pt reports afib and scheduled for ablation on 5/14. Onset of sob last night and reports HR elevated. ekg done at triage and HR 90. Airway intact.

## 2017-09-18 DIAGNOSIS — I43 Cardiomyopathy in diseases classified elsewhere: Secondary | ICD-10-CM | POA: Diagnosis not present

## 2017-09-18 DIAGNOSIS — N179 Acute kidney failure, unspecified: Secondary | ICD-10-CM | POA: Diagnosis not present

## 2017-09-18 DIAGNOSIS — E871 Hypo-osmolality and hyponatremia: Secondary | ICD-10-CM | POA: Diagnosis present

## 2017-09-18 DIAGNOSIS — D649 Anemia, unspecified: Secondary | ICD-10-CM | POA: Diagnosis present

## 2017-09-18 DIAGNOSIS — I447 Left bundle-branch block, unspecified: Secondary | ICD-10-CM | POA: Diagnosis present

## 2017-09-18 DIAGNOSIS — Z96653 Presence of artificial knee joint, bilateral: Secondary | ICD-10-CM | POA: Diagnosis present

## 2017-09-18 DIAGNOSIS — Q231 Congenital insufficiency of aortic valve: Secondary | ICD-10-CM | POA: Diagnosis not present

## 2017-09-18 DIAGNOSIS — I110 Hypertensive heart disease with heart failure: Secondary | ICD-10-CM | POA: Diagnosis present

## 2017-09-18 DIAGNOSIS — I5021 Acute systolic (congestive) heart failure: Secondary | ICD-10-CM | POA: Diagnosis not present

## 2017-09-18 DIAGNOSIS — I4891 Unspecified atrial fibrillation: Secondary | ICD-10-CM | POA: Diagnosis not present

## 2017-09-18 DIAGNOSIS — I5023 Acute on chronic systolic (congestive) heart failure: Secondary | ICD-10-CM | POA: Diagnosis present

## 2017-09-18 DIAGNOSIS — K219 Gastro-esophageal reflux disease without esophagitis: Secondary | ICD-10-CM | POA: Diagnosis present

## 2017-09-18 DIAGNOSIS — Z7901 Long term (current) use of anticoagulants: Secondary | ICD-10-CM | POA: Diagnosis not present

## 2017-09-18 DIAGNOSIS — I429 Cardiomyopathy, unspecified: Secondary | ICD-10-CM | POA: Diagnosis present

## 2017-09-18 DIAGNOSIS — Z8711 Personal history of peptic ulcer disease: Secondary | ICD-10-CM | POA: Diagnosis not present

## 2017-09-18 DIAGNOSIS — R000 Tachycardia, unspecified: Secondary | ICD-10-CM | POA: Diagnosis not present

## 2017-09-18 DIAGNOSIS — Z823 Family history of stroke: Secondary | ICD-10-CM | POA: Diagnosis not present

## 2017-09-18 DIAGNOSIS — E785 Hyperlipidemia, unspecified: Secondary | ICD-10-CM | POA: Diagnosis not present

## 2017-09-18 DIAGNOSIS — Z791 Long term (current) use of non-steroidal anti-inflammatories (NSAID): Secondary | ICD-10-CM | POA: Diagnosis not present

## 2017-09-18 DIAGNOSIS — I482 Chronic atrial fibrillation: Secondary | ICD-10-CM | POA: Diagnosis not present

## 2017-09-18 DIAGNOSIS — I481 Persistent atrial fibrillation: Secondary | ICD-10-CM | POA: Diagnosis not present

## 2017-09-18 DIAGNOSIS — I480 Paroxysmal atrial fibrillation: Secondary | ICD-10-CM | POA: Diagnosis present

## 2017-09-18 DIAGNOSIS — I428 Other cardiomyopathies: Secondary | ICD-10-CM | POA: Diagnosis not present

## 2017-09-18 DIAGNOSIS — F419 Anxiety disorder, unspecified: Secondary | ICD-10-CM | POA: Diagnosis present

## 2017-09-18 DIAGNOSIS — I5043 Acute on chronic combined systolic (congestive) and diastolic (congestive) heart failure: Secondary | ICD-10-CM | POA: Diagnosis not present

## 2017-09-18 DIAGNOSIS — M1990 Unspecified osteoarthritis, unspecified site: Secondary | ICD-10-CM | POA: Diagnosis present

## 2017-09-18 DIAGNOSIS — I493 Ventricular premature depolarization: Secondary | ICD-10-CM | POA: Diagnosis present

## 2017-09-18 DIAGNOSIS — R0602 Shortness of breath: Secondary | ICD-10-CM | POA: Diagnosis not present

## 2017-09-18 DIAGNOSIS — N183 Chronic kidney disease, stage 3 (moderate): Secondary | ICD-10-CM | POA: Diagnosis not present

## 2017-09-18 LAB — BASIC METABOLIC PANEL
Anion gap: 13 (ref 5–15)
BUN: 42 mg/dL — ABNORMAL HIGH (ref 6–20)
CO2: 22 mmol/L (ref 22–32)
CREATININE: 1.83 mg/dL — AB (ref 0.61–1.24)
Calcium: 8.7 mg/dL — ABNORMAL LOW (ref 8.9–10.3)
Chloride: 99 mmol/L — ABNORMAL LOW (ref 101–111)
GFR calc non Af Amer: 34 mL/min — ABNORMAL LOW (ref >60)
GFR, EST AFRICAN AMERICAN: 39 mL/min — AB (ref >60)
Glucose, Bld: 127 mg/dL — ABNORMAL HIGH (ref 65–99)
POTASSIUM: 3.4 mmol/L — AB (ref 3.5–5.1)
SODIUM: 134 mmol/L — AB (ref 135–145)

## 2017-09-18 MED ORDER — SODIUM CHLORIDE 0.9% FLUSH: 3.0000 mL | INTRAVENOUS | Status: DC | PRN

## 2017-09-18 MED ORDER — RIVAROXABAN 15 MG PO TABS
15.0000 mg | ORAL_TABLET | Freq: Every day | ORAL | Status: DC
  Administered 2017-09-18 – 2017-09-20 (×3): 15 mg via ORAL
  Filled 2017-09-18 (×3): qty 1

## 2017-09-18 MED ORDER — SODIUM CHLORIDE 0.9 % IV SOLN: 250.0000 mL | INTRAVENOUS | Status: DC

## 2017-09-18 NOTE — Progress Notes (Signed)
    Cardioversion scheduled for 09/21/2017 at 1:00 with Dr. Sallyanne Kuster, orders written  Rosaria Ferries, PA-C 09/18/2017 4:15 PM Beeper [phone]

## 2017-09-18 NOTE — Care Management Note (Signed)
Case Management Note  Patient Details  Name: Travis [name] MRN: [id number] Date of Birth: Sep 28, 1940  Subjective/Objective:   CHF                Action/Plan: Patient is independent prior to admission; Primary Care Provider:Hunter, Brayton Mars, MD; has private insurance with Medicare; CM will continue to follow for progression of care.  Expected Discharge Date:  09/19/17               Expected Discharge Plan:  Home/Self Care  In-House Referral:   [facility]  Discharge planning Services  CM Consult  Status of Service:  In process, will continue to follow  Sherrilyn Rist [phone] 09/18/2017, 11:52 AM

## 2017-09-18 NOTE — Progress Notes (Addendum)
Progress Note  Patient Name: Travis [name] Date of Encounter: 09/18/2017  Primary Cardiologist: Virl Axe, MD / Clearnce Hasten  Subjective   Feeling better. No chest pain, sob or palpitations. Has not done anything though   Inpatient Medications    Scheduled Meds: . [START ON 09/20/2017] atorvastatin  40 mg Oral Q Sun  . carvedilol  25 mg Oral BID WC  . finasteride  1 mg Oral Daily  . pantoprazole  40 mg Oral Daily  . potassium chloride  20 mEq Oral Daily  . rivaroxaban  20 mg Oral Q supper  . sodium chloride flush  3 mL Intravenous Q12H   Continuous Infusions: . sodium chloride    . amiodarone 30 mg/hr (09/18/17 0529)   PRN Meds: sodium chloride, acetaminophen, ALPRAZolam, ondansetron (ZOFRAN) IV, polyvinyl alcohol, sodium chloride flush, temazepam   Vital Signs    Vitals:   09/17/17 1948 09/17/17 2118 09/18/17 0026 09/18/17 0522  BP: 125/80 116/84 111/71 126/89  Pulse: 76 93 85 85  Resp: 20 18 18 18   Temp:   (!) 97.5 F (36.4 C) (!) 97.5 F (36.4 C)  TempSrc:   Oral Oral  SpO2: 96% 92% 95% 94%  Weight:  176 lb 9.6 oz (80.1 kg)  176 lb 1.6 oz (79.9 kg)  Height:  5\' 8"  (1.727 m)      Intake/Output Summary (Last 24 hours) at 09/18/2017 0811 Last data filed at 09/18/2017 0523 Gross per 24 hour  Intake 573.5 ml  Output 1950 ml  Net -1376.5 ml   Filed Weights   09/17/17 2118 09/18/17 0522  Weight: 176 lb 9.6 oz (80.1 kg) 176 lb 1.6 oz (79.9 kg)    Telemetry    afib at rate of 70s - Personally Reviewed  ECG    N/A  Physical Exam   GEN: No acute distress.   Neck: No JVD Cardiac: Ir IR , no murmurs, rubs, or gallops.  Respiratory: Clear to auscultation bilaterally. GI: Soft, nontender, non-distended  MS: No edema; No deformity. Neuro:  Nonfocal  Psych: Normal affect   Labs    Chemistry Recent Labs  Lab 09/17/17 0846 09/18/17 0511  NA 132* 134*  K 3.8 3.4*  CL 102 99*  CO2 18* 22  GLUCOSE 217* 127*  BUN 49* 42*  CREATININE 1.81* 1.83*    CALCIUM 8.9 8.7*  GFRNONAA 34* 34*  GFRAA 40* 39*  ANIONGAP 12 13     Hematology Recent Labs  Lab 09/17/17 0846  WBC 7.3  RBC 4.04*  HGB 13.5  HCT 39.7  MCV 98.3  MCH 33.4  MCHC 34.0  RDW 14.4  PLT 161    Recent Labs  Lab 09/17/17 0915  TROPIPOC 0.02     BNP Recent Labs  Lab 09/17/17 1313  BNP 1,399.3*    Radiology    Dg Chest 2 View  Result Date: 09/17/2017 CLINICAL DATA:  Shortness of breath EXAM: CHEST - 2 VIEW COMPARISON:  08/12/2017 FINDINGS: Chronic cardiomegaly. Generalized interstitial coarsening with trace pleural fluid. Extent is less severe than on prior. No pneumothorax or focal consolidation. Artifact from EKG leads. IMPRESSION: CHF pattern. Electronically Signed   By: Monte Fantasia M.D.   On: 09/17/2017 09:25    Cardiac Studies   Echo 08/13/2017 Study Conclusions  - Left ventricle: The cavity size was normal. Wall thickness was   increased in a pattern of moderate LVH. Systolic function was   moderately to severely reduced. The estimated ejection fraction  was in the range of 30% to 35%. - Aortic valve: AV is thickened, calcified with restricted motion.   Peak and mean gradients through the valve are 16 and 11 mm Hg   respectively. Dimensionless index is 0.48. 2 D imaging suggests   moderate AS. There was mild regurgitation. - Left atrium: The atrium was severely dilated. - Right ventricle: The cavity size was mildly dilated. Systolic   function was mildly to moderately reduced. - Pulmonary arteries: PA peak pressure: 46 mm Hg (S).  Patient Profile     Travis [name] a 77 y.o.malewith a hx of PAF, and pulmonary edema in 07/2017 due to a fib with RVR and associated with acute respiratory failure, functionally bicuspid aortic valve mod AS and mild AR, CM with EF 30-35%, GERD, and failure of Phyllis Ginger -amiodarone was started on 09/02/17 with plan for ablation 10/06/17 and cardiac CTA 5/9/19who presented 4/25 for evaluation of SOB with  elevated HR.   Assessment & Plan    1. Acute on chronic systolic CHF   Pt doing poorly  Poor forward outpt as indicated by Increased Cr, increased INR  Have considered PICC line but INR up   -CXR with CHF pattern. BNP 1399. So far given IV lasix 40mg  x 1 & IV lasix 80mg  x 1. Net I & O negative 1.3L. Weight stable. BUN/creatinine 49/1.81-->42/1.83. Breathing stable. Diuretics per Dr. Harrington Challenger.   2. Cardiomyopathy - LVEF of 30-35%. Likely tachy induced. No chest pain. Cardiac CTA 10/01/17.   3. AKI - SCr as above. Follow closely.   4. Persistent atrial fibrillation - Failed Tikosyn.Was on PO amio for a few wks  Tachycardic yesterday in ED Started on IV amiodarone this admission to hasten load   Pt is doing poorly  Looks like low flow state  (BP OK but Cr up and INR up (On Xarelto)   Discussed with  EP  Plan AMio over the weekend IV    cardioverison attempt on Monday   J Allred with be back to work next wk  Will inform him of progress . Plan for ablation 10/06/17.   5. Elevated TSH  5.1  Minimal - Followed closely while on amiodarone.   6. Functionally bicuspid aortic valve mild/ mod AS and mild AR - Follow with periodic echo    For questions or updates, please contact [place] Please consult [email or web address] for contact info under Cardiology/STEMI.      Signed, Leanor Kail, PA  09/18/2017, 8:11 AM    Pt seen and exmained    I have amended note above by B Bhagat to reflect my findings  PT looks MUCH better this am than yesterday   Has diuresed some and rate is better  But he is not doing anything   He is in bed  ON exam:  Lungs CTA   Cardiac Irreg Irreg    Abd is not distended   Ext with trivial edema  As noted above I would recomm continued IV amiodarone load   Will give 1 mg/min today  Helps rate  May help keep in SR if converts  Plan cardioversion attempt Monday   He has not done well with atrial fib     Follow I/O for now  Exam   Cr   Hold on diuresis for now    [employer]

## 2017-09-19 DIAGNOSIS — I481 Persistent atrial fibrillation: Principal | ICD-10-CM

## 2017-09-19 DIAGNOSIS — I428 Other cardiomyopathies: Secondary | ICD-10-CM

## 2017-09-19 LAB — BASIC METABOLIC PANEL
Anion gap: 13 (ref 5–15)
BUN: 41 mg/dL — ABNORMAL HIGH (ref 6–20)
CALCIUM: 8.8 mg/dL — AB (ref 8.9–10.3)
CHLORIDE: 95 mmol/L — AB (ref 101–111)
CO2: 25 mmol/L (ref 22–32)
Creatinine, Ser: 1.56 mg/dL — ABNORMAL HIGH (ref 0.61–1.24)
GFR calc Af Amer: 48 mL/min — ABNORMAL LOW (ref >60)
GFR calc non Af Amer: 41 mL/min — ABNORMAL LOW (ref >60)
GLUCOSE: 126 mg/dL — AB (ref 65–99)
Potassium: 3.4 mmol/L — ABNORMAL LOW (ref 3.5–5.1)
Sodium: 133 mmol/L — ABNORMAL LOW (ref 135–145)

## 2017-09-19 LAB — CBC
HEMATOCRIT: 43 % (ref 39.0–52.0)
Hemoglobin: 14.5 g/dL (ref 13.0–17.0)
MCH: 33.9 pg (ref 26.0–34.0)
MCHC: 33.7 g/dL (ref 30.0–36.0)
MCV: 100.5 fL — AB (ref 78.0–100.0)
Platelets: 150 10*3/uL (ref 150–400)
RBC: 4.28 MIL/uL (ref 4.22–5.81)
RDW: 14.7 % (ref 11.5–15.5)
WBC: 7.4 10*3/uL (ref 4.0–10.5)

## 2017-09-19 MED ORDER — FUROSEMIDE 10 MG/ML IJ SOLN
40.0000 mg | Freq: Every day | INTRAMUSCULAR | Status: DC
  Administered 2017-09-19 – 2017-09-20 (×2): 40 mg via INTRAVENOUS
  Filled 2017-09-19 (×2): qty 4

## 2017-09-19 MED ORDER — POTASSIUM CHLORIDE CRYS ER 20 MEQ PO TBCR
40.0000 meq | EXTENDED_RELEASE_TABLET | Freq: Every day | ORAL | Status: DC
  Administered 2017-09-19 – 2017-09-21 (×3): 40 meq via ORAL
  Filled 2017-09-19 (×3): qty 2

## 2017-09-19 NOTE — Progress Notes (Signed)
Pt. c/o SOB SPO2 94% on RA placed on 02 2L pt lying in bed. Danan Dunn made aware. Orders placed for labs.

## 2017-09-19 NOTE — Progress Notes (Signed)
BP 99/77 paged MD ok to give coreg.

## 2017-09-19 NOTE — Progress Notes (Signed)
Called by nurse regarding patient reported recurrence of SOB this afternoon. Per nurse, VSS with stable borderline BPs unchanged from prior, tele unchanged from prior, O2 sat OK. Pt placed on O2 for comfort with improvement and now feeling better. No CP reported. I see plan was to resume diuresis today for weight increase. Will update CBC given ongoing anticoagulation, otherwise continue plan as outlined by Dr. Meda Coffee today for resumption of diuretic. Dr. Meda Coffee updated of patient's complaint and has no other new recs for now. She felt HR was well controlled today and recommends to continue amio as outlined. Nurse advised to contact us back if any recurrent sx or change in status. Bayyinah Dukeman PA-C

## 2017-09-19 NOTE — Progress Notes (Signed)
Progress Note  Patient Name: Travis [name] Date of Encounter: 09/19/2017  Primary Cardiologist: Virl Axe, MD / Clearnce Hasten  Subjective   Feeling better. No chest pain, sob or palpitations. Reading comfortably.  Inpatient Medications    Scheduled Meds: . [START ON 09/20/2017] atorvastatin  40 mg Oral Q Sun  . carvedilol  25 mg Oral BID WC  . pantoprazole  40 mg Oral Daily  . potassium chloride SA  20 mEq Oral Daily  . rivaroxaban  15 mg Oral Q supper  . sodium chloride flush  3 mL Intravenous Q12H   Continuous Infusions: . sodium chloride    . sodium chloride    . amiodarone 60 mg/hr (09/19/17 0718)   PRN Meds: sodium chloride, acetaminophen, ALPRAZolam, ondansetron (ZOFRAN) IV, polyvinyl alcohol, sodium chloride flush, sodium chloride flush, temazepam   Vital Signs    Vitals:   09/18/17 1600 09/18/17 2108 09/19/17 0027 09/19/17 0525  BP: 98/75 98/77 106/86 124/86  Pulse: 78 68 80 87  Resp: 18 18 18 18   Temp:  (!) 97.4 F (36.3 C) (!) 97.4 F (36.3 C) (!) 97.4 F (36.3 C)  TempSrc: Oral Oral Oral Oral  SpO2: 95% 95% 95% 96%  Weight:    178 lb (80.7 kg)  Height:        Intake/Output Summary (Last 24 hours) at 09/19/2017 1012 Last data filed at 09/19/2017 0830 Gross per 24 hour  Intake 2029.47 ml  Output 700 ml  Net 1329.47 ml   Filed Weights   09/17/17 2118 09/18/17 0522 09/19/17 0525  Weight: 176 lb 9.6 oz (80.1 kg) 176 lb 1.6 oz (79.9 kg) 178 lb (80.7 kg)    Telemetry    afib at rate of 70s - Personally Reviewed  ECG    N/A  Physical Exam   GEN: No acute distress.   Neck: No JVD Cardiac: Ir IR , no murmurs, rubs, or gallops.  Respiratory: Clear to auscultation bilaterally. GI: Soft, nontender, non-distended  MS: No edema; No deformity. Neuro:  Nonfocal  Psych: Normal affect   Labs    Chemistry Recent Labs  Lab 09/17/17 0846 09/18/17 0511 09/19/17 0620  NA 132* 134* 133*  K 3.8 3.4* 3.4*  CL 102 99* 95*  CO2 18* 22 25  GLUCOSE  217* 127* 126*  BUN 49* 42* 41*  CREATININE 1.81* 1.83* 1.56*  CALCIUM 8.9 8.7* 8.8*  GFRNONAA 34* 34* 41*  GFRAA 40* 39* 48*  ANIONGAP 12 13 13      Hematology Recent Labs  Lab 09/17/17 0846  WBC 7.3  RBC 4.04*  HGB 13.5  HCT 39.7  MCV 98.3  MCH 33.4  MCHC 34.0  RDW 14.4  PLT 161    Recent Labs  Lab 09/17/17 0915  TROPIPOC 0.02     BNP Recent Labs  Lab 09/17/17 1313  BNP 1,399.3*    Radiology    No results found.  Cardiac Studies   Echo 08/13/2017 Study Conclusions  - Left ventricle: The cavity size was normal. Wall thickness was   increased in a pattern of moderate LVH. Systolic function was   moderately to severely reduced. The estimated ejection fraction   was in the range of 30% to 35%. - Aortic valve: AV is thickened, calcified with restricted motion.   Peak and mean gradients through the valve are 16 and 11 mm Hg   respectively. Dimensionless index is 0.48. 2 D imaging suggests   moderate AS. There was mild regurgitation. - Left  atrium: The atrium was severely dilated. - Right ventricle: The cavity size was mildly dilated. Systolic   function was mildly to moderately reduced. - Pulmonary arteries: PA peak pressure: 46 mm Hg (S).  Patient Profile     Reshad Saab Meltonis a 77 y.o.malewith a hx of PAF, and pulmonary edema in 07/2017 due to a fib with RVR and associated with acute respiratory failure, functionally bicuspid aortic valve mod AS and mild AR, CM with EF 30-35%, GERD, and failure of Phyllis Ginger -amiodarone was started on 09/02/17 with plan for ablation 10/06/17 and cardiac CTA 5/9/19who presented 4/25 for evaluation of SOB with elevated HR.   Assessment & Plan    1. Acute on chronic systolic CHF   Pt doing poorly  Poor forward outpt as indicated by Increased Cr, now improving 1.8->1.56, increased INR  Have considered PICC line but INR up   -CXR with CHF pattern. BNP 1399. So far given IV lasix 40mg  x 1 & IV lasix 80mg  x 1. Net I & O up 2 lbs  since yesterday, I will restart lasix 40 mg iv Daily. Reassess tomorrow. Replace potassium Breathing stable.   2. Cardiomyopathy - LVEF of 30-35%. Likely tachy induced. No chest pain. Cardiac CTA 10/01/17.   3. AKI - SCr as above. Follow closely.   4. Persistent atrial fibrillation - Failed Tikosyn.Was on PO amio for a few wks  Tachycardic yesterday in ED Started on IV amiodarone this admission to hasten load, improved rates now in 70-80'.  Looks like low flow state  (BP OK but Cr up and INR up (On Xarelto)   Discussed with  EP  Plan AMio over the weekend IV    cardioverison attempt on Monday   J Allred with be back to work next wk  Will inform him of progress . Plan for ablation 10/06/17. Cardioversion scheduled for 09/21/2017 at 1:00 with Dr. Sallyanne Kuster  5. Elevated TSH  5.1  Minimal - Followed closely while on amiodarone.   6. Functionally bicuspid aortic valve mild/ mod AS and mild AR - Follow with periodic echo    For questions or updates, please contact [place] Please consult [email or web address] for contact info under Cardiology/STEMI.      Signed, Ena Dawley, MD  09/19/2017, 10:12 AM

## 2017-09-20 DIAGNOSIS — E785 Hyperlipidemia, unspecified: Secondary | ICD-10-CM

## 2017-09-20 DIAGNOSIS — N183 Chronic kidney disease, stage 3 (moderate): Secondary | ICD-10-CM

## 2017-09-20 DIAGNOSIS — N179 Acute kidney failure, unspecified: Secondary | ICD-10-CM

## 2017-09-20 DIAGNOSIS — I482 Chronic atrial fibrillation: Secondary | ICD-10-CM

## 2017-09-20 LAB — BASIC METABOLIC PANEL
ANION GAP: 9 (ref 5–15)
BUN: 42 mg/dL — ABNORMAL HIGH (ref 6–20)
CALCIUM: 8.8 mg/dL — AB (ref 8.9–10.3)
CO2: 26 mmol/L (ref 22–32)
CREATININE: 1.69 mg/dL — AB (ref 0.61–1.24)
Chloride: 99 mmol/L — ABNORMAL LOW (ref 101–111)
GFR, EST AFRICAN AMERICAN: 43 mL/min — AB (ref >60)
GFR, EST NON AFRICAN AMERICAN: 37 mL/min — AB (ref >60)
Glucose, Bld: 126 mg/dL — ABNORMAL HIGH (ref 65–99)
Potassium: 3.6 mmol/L (ref 3.5–5.1)
SODIUM: 134 mmol/L — AB (ref 135–145)

## 2017-09-20 MED ORDER — FUROSEMIDE 10 MG/ML IJ SOLN
40.0000 mg | Freq: Two times a day (BID) | INTRAMUSCULAR | Status: DC
  Administered 2017-09-20 – 2017-09-21 (×2): 40 mg via INTRAVENOUS
  Filled 2017-09-20 (×2): qty 4

## 2017-09-20 MED ORDER — AMIODARONE HCL IN DEXTROSE 360-4.14 MG/200ML-% IV SOLN
INTRAVENOUS | Status: AC
  Administered 2017-09-20: 14:00:00
  Filled 2017-09-20: qty 200

## 2017-09-20 NOTE — Progress Notes (Addendum)
Progress Note  Patient Name: Travis [name] Date of Encounter: 09/20/2017  Primary Cardiologist: Virl Axe, MD / J Allred  Subjective   Feeling good. No chest pain, sob or palpitations.   Inpatient Medications    Scheduled Meds: . atorvastatin  40 mg Oral Q Sun  . carvedilol  25 mg Oral BID WC  . furosemide  40 mg Intravenous Daily  . pantoprazole  40 mg Oral Daily  . potassium chloride  40 mEq Oral Daily  . rivaroxaban  15 mg Oral Q supper  . sodium chloride flush  3 mL Intravenous Q12H   Continuous Infusions: . sodium chloride    . sodium chloride    . amiodarone 60 mg/hr (09/20/17 0744)   PRN Meds: sodium chloride, acetaminophen, ALPRAZolam, ondansetron (ZOFRAN) IV, polyvinyl alcohol, sodium chloride flush, sodium chloride flush, temazepam   Vital Signs    Vitals:   09/19/17 1712 09/19/17 2113 09/20/17 0616 09/20/17 0618  BP: 99/75 106/74 125/82   Pulse: 66 71 79   Resp:      Temp:   (!) 97.5 F (36.4 C)   TempSrc:   Oral   SpO2:  94% 95%   Weight:    179 lb 1.6 oz (81.2 kg)  Height:        Intake/Output Summary (Last 24 hours) at 09/20/2017 1005 Last data filed at 09/20/2017 0618 Gross per 24 hour  Intake 582 ml  Output 725 ml  Net -143 ml   Filed Weights   09/18/17 0522 09/19/17 0525 09/20/17 0618  Weight: 176 lb 1.6 oz (79.9 kg) 178 lb (80.7 kg) 179 lb 1.6 oz (81.2 kg)    Telemetry    afib at rate of 70s - Personally Reviewed  ECG    N/A  Physical Exam   GEN: No acute distress.   Neck: No JVD Cardiac: Ir IR , no murmurs, rubs, or gallops.  Respiratory: Clear to auscultation bilaterally. GI: Soft, nontender, non-distended  MS: No edema; No deformity. Neuro:  Nonfocal  Psych: Normal affect   Labs    Chemistry Recent Labs  Lab 09/18/17 0511 09/19/17 0620 09/20/17 0517  NA 134* 133* 134*  K 3.4* 3.4* 3.6  CL 99* 95* 99*  CO2 22 25 26   GLUCOSE 127* 126* 126*  BUN 42* 41* 42*  CREATININE 1.83* 1.56* 1.69*  CALCIUM 8.7* 8.8*  8.8*  GFRNONAA 34* 41* 37*  GFRAA 39* 48* 43*  ANIONGAP 13 13 9      Hematology Recent Labs  Lab 09/17/17 0846 09/19/17 1321  WBC 7.3 7.4  RBC 4.04* 4.28  HGB 13.5 14.5  HCT 39.7 43.0  MCV 98.3 100.5*  MCH 33.4 33.9  MCHC 34.0 33.7  RDW 14.4 14.7  PLT 161 150    Recent Labs  Lab 09/17/17 0915  TROPIPOC 0.02     BNP Recent Labs  Lab 09/17/17 1313  BNP 1,399.3*    Radiology    No results found.  Cardiac Studies   Echo 08/13/2017 Study Conclusions  - Left ventricle: The cavity size was normal. Wall thickness was   increased in a pattern of moderate LVH. Systolic function was   moderately to severely reduced. The estimated ejection fraction   was in the range of 30% to 35%. - Aortic valve: AV is thickened, calcified with restricted motion.   Peak and mean gradients through the valve are 16 and 11 mm Hg   respectively. Dimensionless index is 0.48. 2 D imaging suggests  moderate AS. There was mild regurgitation. - Left atrium: The atrium was severely dilated. - Right ventricle: The cavity size was mildly dilated. Systolic   function was mildly to moderately reduced. - Pulmonary arteries: PA peak pressure: 46 mm Hg (S).    Patient Profile     Travis Kawahara Meltonis a 77 y.o.malewith a hx of PAF, and pulmonary edema in 07/2017 due to a fib with RVR and associated with acute respiratory failure, functionally bicuspid aortic valve mod AS and mild AR, CM with EF 30-35%, GERD, and failure of Travis [name] -amiodarone was started on 09/02/17 with plan for ablation 10/06/17 and cardiac CTA 5/9/19who presented 4/25 for evaluation of SOB with elevated HR.   Assessment & Plan    1. Acute on chronic systolic CHF   Pt doing poorly  Poor forward outpt as indicated by Increased Cr, now improving 1.8->1.56-->1.69, increased INR  Have considered PICC line but INR up   -CXR with CHF pattern. BNP 1399. Net I & O up 2 lbs since yesterday, I will restart lasix 40 mg iv BID. Reassess  tomorrow. Replace potassium Breathing stable.   2. Cardiomyopathy - LVEF of 30-35%. Likely tachy induced. No chest pain. Cardiac CTA 10/01/17.   3. AKI - SCr as above. Follow closely.   4. Persistent atrial fibrillation - Failed Tikosyn.Was on PO amio for a few wks  Tachycardic yesterday in ED Started on IV amiodarone this admission to hasten load, improved rates now in 70-80'.  Looks like low flow state  (BP OK but Cr up and INR up (On Xarelto)   Discussed with  EP  Plan AMio over the weekend IV    cardioverison attempt on Monday   J Allred with be back to work next wk  Will inform him of progress . Plan for ablation 10/06/17. Cardioversion scheduled for 09/21/2017 at 1:00 with Dr. Sallyanne Kuster  5. Elevated TSH  5.1  Minimal - Followed closely while on amiodarone.   6. Functionally bicuspid aortic valve mild/ mod AS and mild AR - Follow with periodic echo    For questions or updates, please contact [place] Please consult [email or web address] for contact info under Cardiology/STEMI.      Signed, Ena Dawley, MD  09/20/2017, 10:05 AM

## 2017-09-20 NOTE — Plan of Care (Signed)
°  Problem: Clinical Measurements: °Goal: Ability to maintain clinical measurements within normal limits will improve °Outcome: Progressing °  °Problem: Clinical Measurements: °Goal: Diagnostic test results will improve °Outcome: Progressing °  °

## 2017-09-20 NOTE — H&P (View-Only) (Signed)
Progress Note  Patient Name: Travis [name] Date of Encounter: 09/20/2017  Primary Cardiologist: Virl Axe, MD / J Allred  Subjective   Feeling good. No chest pain, sob or palpitations.   Inpatient Medications    Scheduled Meds: . atorvastatin  40 mg Oral Q Sun  . carvedilol  25 mg Oral BID WC  . furosemide  40 mg Intravenous Daily  . pantoprazole  40 mg Oral Daily  . potassium chloride  40 mEq Oral Daily  . rivaroxaban  15 mg Oral Q supper  . sodium chloride flush  3 mL Intravenous Q12H   Continuous Infusions: . sodium chloride    . sodium chloride    . amiodarone 60 mg/hr (09/20/17 0744)   PRN Meds: sodium chloride, acetaminophen, ALPRAZolam, ondansetron (ZOFRAN) IV, polyvinyl alcohol, sodium chloride flush, sodium chloride flush, temazepam   Vital Signs    Vitals:   09/19/17 1712 09/19/17 2113 09/20/17 0616 09/20/17 0618  BP: 99/75 106/74 125/82   Pulse: 66 71 79   Resp:      Temp:   (!) 97.5 F (36.4 C)   TempSrc:   Oral   SpO2:  94% 95%   Weight:    179 lb 1.6 oz (81.2 kg)  Height:        Intake/Output Summary (Last 24 hours) at 09/20/2017 1005 Last data filed at 09/20/2017 0618 Gross per 24 hour  Intake 582 ml  Output 725 ml  Net -143 ml   Filed Weights   09/18/17 0522 09/19/17 0525 09/20/17 0618  Weight: 176 lb 1.6 oz (79.9 kg) 178 lb (80.7 kg) 179 lb 1.6 oz (81.2 kg)    Telemetry    afib at rate of 70s - Personally Reviewed  ECG    N/A  Physical Exam   GEN: No acute distress.   Neck: No JVD Cardiac: Ir IR , no murmurs, rubs, or gallops.  Respiratory: Clear to auscultation bilaterally. GI: Soft, nontender, non-distended  MS: No edema; No deformity. Neuro:  Nonfocal  Psych: Normal affect   Labs    Chemistry Recent Labs  Lab 09/18/17 0511 09/19/17 0620 09/20/17 0517  NA 134* 133* 134*  K 3.4* 3.4* 3.6  CL 99* 95* 99*  CO2 22 25 26   GLUCOSE 127* 126* 126*  BUN 42* 41* 42*  CREATININE 1.83* 1.56* 1.69*  CALCIUM 8.7* 8.8*  8.8*  GFRNONAA 34* 41* 37*  GFRAA 39* 48* 43*  ANIONGAP 13 13 9      Hematology Recent Labs  Lab 09/17/17 0846 09/19/17 1321  WBC 7.3 7.4  RBC 4.04* 4.28  HGB 13.5 14.5  HCT 39.7 43.0  MCV 98.3 100.5*  MCH 33.4 33.9  MCHC 34.0 33.7  RDW 14.4 14.7  PLT 161 150    Recent Labs  Lab 09/17/17 0915  TROPIPOC 0.02     BNP Recent Labs  Lab 09/17/17 1313  BNP 1,399.3*    Radiology    No results found.  Cardiac Studies   Echo 08/13/2017 Study Conclusions  - Left ventricle: The cavity size was normal. Wall thickness was   increased in a pattern of moderate LVH. Systolic function was   moderately to severely reduced. The estimated ejection fraction   was in the range of 30% to 35%. - Aortic valve: AV is thickened, calcified with restricted motion.   Peak and mean gradients through the valve are 16 and 11 mm Hg   respectively. Dimensionless index is 0.48. 2 D imaging suggests  moderate AS. There was mild regurgitation. - Left atrium: The atrium was severely dilated. - Right ventricle: The cavity size was mildly dilated. Systolic   function was mildly to moderately reduced. - Pulmonary arteries: PA peak pressure: 46 mm Hg (S).    Patient Profile     Travis Morrical Meltonis a 77 y.o.malewith a hx of PAF, and pulmonary edema in 07/2017 due to a fib with RVR and associated with acute respiratory failure, functionally bicuspid aortic valve mod AS and mild AR, CM with EF 30-35%, GERD, and failure of Travis [name] -amiodarone was started on 09/02/17 with plan for ablation 10/06/17 and cardiac CTA 5/9/19who presented 4/25 for evaluation of SOB with elevated HR.   Assessment & Plan    1. Acute on chronic systolic CHF   Pt doing poorly  Poor forward outpt as indicated by Increased Cr, now improving 1.8->1.56-->1.69, increased INR  Have considered PICC line but INR up   -CXR with CHF pattern. BNP 1399. Net I & O up 2 lbs since yesterday, I will restart lasix 40 mg iv BID. Reassess  tomorrow. Replace potassium Breathing stable.   2. Cardiomyopathy - LVEF of 30-35%. Likely tachy induced. No chest pain. Cardiac CTA 10/01/17.   3. AKI - SCr as above. Follow closely.   4. Persistent atrial fibrillation - Failed Tikosyn.Was on PO amio for a few wks  Tachycardic yesterday in ED Started on IV amiodarone this admission to hasten load, improved rates now in 70-80'.  Looks like low flow state  (BP OK but Cr up and INR up (On Xarelto)   Discussed with  EP  Plan AMio over the weekend IV    cardioverison attempt on Monday   J Allred with be back to work next wk  Will inform him of progress . Plan for ablation 10/06/17. Cardioversion scheduled for 09/21/2017 at 1:00 with Dr. Sallyanne Kuster  5. Elevated TSH  5.1  Minimal - Followed closely while on amiodarone.   6. Functionally bicuspid aortic valve mild/ mod AS and mild AR - Follow with periodic echo    For questions or updates, please contact [place] Please consult [email or web address] for contact info under Cardiology/STEMI.      Signed, Ena Dawley, MD  09/20/2017, 10:05 AM

## 2017-09-21 ENCOUNTER — Encounter (HOSPITAL_COMMUNITY): Payer: Self-pay | Admitting: *Deleted

## 2017-09-21 ENCOUNTER — Encounter (HOSPITAL_COMMUNITY)
Admission: EM | Disposition: A | Discharge status: Home / Self Care | Payer: Self-pay | Source: Home / Self Care | Attending: Internal Medicine

## 2017-09-21 ENCOUNTER — Inpatient Hospital Stay (HOSPITAL_COMMUNITY): Payer: Medicare Other | Admitting: Anesthesiology

## 2017-09-21 DIAGNOSIS — N179 Acute kidney failure, unspecified: Secondary | ICD-10-CM

## 2017-09-21 DIAGNOSIS — I43 Cardiomyopathy in diseases classified elsewhere: Secondary | ICD-10-CM

## 2017-09-21 DIAGNOSIS — R000 Tachycardia, unspecified: Secondary | ICD-10-CM

## 2017-09-21 HISTORY — PX: CARDIOVERSION: SHX1299

## 2017-09-21 LAB — BASIC METABOLIC PANEL
Anion gap: 11 (ref 5–15)
BUN: 44 mg/dL — ABNORMAL HIGH (ref 6–20)
CALCIUM: 8.9 mg/dL (ref 8.9–10.3)
CO2: 24 mmol/L (ref 22–32)
Chloride: 98 mmol/L — ABNORMAL LOW (ref 101–111)
Creatinine, Ser: 1.81 mg/dL — ABNORMAL HIGH (ref 0.61–1.24)
GFR calc Af Amer: 40 mL/min — ABNORMAL LOW (ref >60)
GFR, EST NON AFRICAN AMERICAN: 34 mL/min — AB (ref >60)
GLUCOSE: 129 mg/dL — AB (ref 65–99)
Potassium: 3.4 mmol/L — ABNORMAL LOW (ref 3.5–5.1)
Sodium: 133 mmol/L — ABNORMAL LOW (ref 135–145)

## 2017-09-21 SURGERY — CARDIOVERSION
Anesthesia: General

## 2017-09-21 MED ORDER — AMIODARONE HCL 200 MG PO TABS
200.0000 mg | ORAL_TABLET | Freq: Two times a day (BID) | ORAL | Status: DC
  Administered 2017-09-21: 200 mg via ORAL
  Filled 2017-09-21: qty 1

## 2017-09-21 MED ORDER — POTASSIUM CHLORIDE CRYS ER 20 MEQ PO TBCR: 40.0000 meq | EXTENDED_RELEASE_TABLET | Freq: Every day | ORAL | 2 refills | Status: DC

## 2017-09-21 MED ORDER — SODIUM CHLORIDE 0.9 % IV SOLN
INTRAVENOUS | Status: DC
  Administered 2017-09-21: 06:00:00 via INTRAVENOUS

## 2017-09-21 MED ORDER — CARVEDILOL 12.5 MG PO TABS: 12.5000 mg | ORAL_TABLET | Freq: Two times a day (BID) | ORAL | 3 refills | Status: DC

## 2017-09-21 MED ORDER — FUROSEMIDE 40 MG PO TABS
40.0000 mg | ORAL_TABLET | Freq: Every day | ORAL | Status: DC
  Administered 2017-09-21: 40 mg via ORAL
  Filled 2017-09-21: qty 1

## 2017-09-21 MED ORDER — CARVEDILOL 12.5 MG PO TABS: 12.5000 mg | ORAL_TABLET | Freq: Two times a day (BID) | ORAL | Status: DC

## 2017-09-21 MED ORDER — LIDOCAINE HCL (CARDIAC) PF 100 MG/5ML IV SOSY
PREFILLED_SYRINGE | INTRAVENOUS | Status: DC | PRN
  Administered 2017-09-21: 20 mg via INTRAVENOUS

## 2017-09-21 MED ORDER — RIVAROXABAN 15 MG PO TABS: 15.0000 mg | ORAL_TABLET | Freq: Every day | ORAL | 2 refills | Status: DC

## 2017-09-21 MED ORDER — PROPOFOL 10 MG/ML IV BOLUS
INTRAVENOUS | Status: DC | PRN
  Administered 2017-09-21: 60 mg via INTRAVENOUS

## 2017-09-21 NOTE — Discharge Summary (Signed)
Discharge Summary    Patient ID: Travis [name],  MRN: [id number], DOB/AGE: Feb 02, 1941 77 y.o.  Admit date: 09/17/2017 Discharge date: 09/21/2017  Primary Care Provider: Marin [name] Primary Cardiologist: Travis Axe, MD  Discharge Diagnoses    Principal Problem:   Persistent atrial fibrillation [facility]) Active Problems:   Cardiomyopathy, rate related with resolution now recurrent   Hypertension   Hyponatremia   Sinus bradycardia   Atrial fibrillation status post cardioversion [facility])   Acute systolic HF (heart failure) (HCC)   AKI (acute kidney injury) ([place])  Allergies No Known Allergies  Diagnostic Studies/Procedures    Successful DCCV 62/95/28 without complication  _____________   History of Present Illness    Travis Clinkscale Meltonis a 77 y.o.malewith a hx of PAF (with multiple DCCV including 08/14/17 and 08/31/17), and pulmonary edema in 07/2017 due to a fib with RVR and associated with acute respiratory failure, functionally bicuspid aortic valve mod AS and mild AR, CM with EF 30-35%, GERD, and failure of Travis [name] (amiodarone was started on 09/02/17 with plan for ablation 10/06/17 and cardiac CTA 5/9/19who is being seen by Cardiology for the evaluation of SOB with elevated HR.  Hospital Course    Pt presented to [facility] on 09/17/2017 with complaints of shortness of breath, increased fatigue, and weight gain of 1.5lb over the last week.  He is a pt of [name] and has had ongoing issues with atrial fibrillation with failed DCCV's and medication therapies including Tikosyn. The plan, according to his last office note with [name] on 09/02/2017 was to schedule for an ablation procedure after cardiac CT obtained. He was started on Amiodarone 200mg  PO in the meantime and has been followed with Travis [name] in the Afib clinic.   On presentation to the ED, the pt was in acute systolic heart failure with CXR revealing CHF pattern. His BNP was elevated at 1399. He was given Lasix  IV 40mg  in the ED and was started on scheduled doses. This has now been transitioned back to his home dose of Lasix 40mg  PO QD. His weight on day of discharge is 180lb, four pounds more than his admission weight at 176lb. Total output 1.3L. He was started on an Amiodarone gtt on presentation to the hospital, but has now been transitioned back to his PO dose of 200 mg. His course was complicated by AKI with a creatinine of 1.8 on admission and is 1.81 on day of discharge. This is above his baseline of 0.8-1.0. He will need close monitoring of this as an outpatient while he awaits his cardiac CT and scheduled ablation. His TSH on admission was 5.1 and will need to be followed closely given that he is on Amiodarone.   Given his persistent symptoms, the pt was scheduled for a DCCV on 09/21/17. This was performed without complication with one synchronized biphasic shock with 120J. Post DCCV EKG with SB with HR of 48 bpm and occasional PAC. On day of discharge, the patient was not SOB and was feeling much better. Given his bradycardia post procedure, his carvedilol was decreased to 12.5mg  PO BID.   Plan is to keep him on Lasix 40 mg PO QD, add KCL 40 meq PO QD. His carvedilol was decreased to 12.5mg  PO BID. His Xarelto was decreased to 15 mg given his renal function. He is scheduled for labs on 09/29/17. He has an appointment with Travis [name] on 09/28/17 at 11am. His RFA is scheduled with [name] on 10/06/17  Consultants: None  Pt was seen by Dr. Meda Coffee who feels that pt is stable for discharge today, 09/21/17. Follow appointments have been made and all discharge medications are listed below.  ___________  Discharge Vitals Blood pressure 104/72, pulse (!) 56, temperature 98.1 F (36.7 C), temperature source Oral, resp. rate 20, height 5\' 8"  (1.727 m), weight 180 lb 4.8 oz (81.8 kg), SpO2 96 %.  Filed Weights   09/19/17 0525 09/20/17 0618 09/21/17 0540  Weight: 178 lb (80.7 kg) 179 lb 1.6 oz (81.2 kg)  180 lb 4.8 oz (81.8 kg)   Labs & Radiologic Studies    CBC Recent Labs    09/19/17 1321  WBC 7.4  HGB 14.5  HCT 43.0  MCV 100.5*  PLT 341   Basic Metabolic Panel Recent Labs    09/20/17 0517 09/21/17 0701  NA 134* 133*  K 3.6 3.4*  CL 99* 98*  CO2 26 24  GLUCOSE 126* 129*  BUN 42* 44*  CREATININE 1.69* 1.81*  CALCIUM 8.8* 8.9   _____________  Dg Chest 2 View  Result Date: 09/17/2017 CLINICAL DATA:  Shortness of breath EXAM: CHEST - 2 VIEW COMPARISON:  08/12/2017 FINDINGS: Chronic cardiomegaly. Generalized interstitial coarsening with trace pleural fluid. Extent is less severe than on prior. No pneumothorax or focal consolidation. Artifact from EKG leads. IMPRESSION: CHF pattern. Electronically Signed   By: Monte Fantasia M.D.   On: 09/17/2017 09:25   Disposition   Pt is being discharged home today in good condition.  Follow-up Plans & Appointments   Follow-up Information    Sherran Needs, NP Follow up on 09/28/2017.   Specialties:  Nurse Practitioner, Cardiology Why:  Your follow up appointment is on 10/08/17 with Travis [name] at 11am.  Contact information: [place] [address] [phone]          Discharge Instructions    Call MD for:  difficulty breathing, headache or visual disturbances   Complete by:  As directed    Call MD for:  extreme fatigue   Complete by:  As directed    Call MD for:  persistant nausea and vomiting   Complete by:  As directed    Call MD for:  redness, tenderness, or signs of infection (pain, swelling, redness, odor or green/yellow discharge around incision site)   Complete by:  As directed    Call MD for:  severe uncontrolled pain   Complete by:  As directed    Diet - low sodium heart healthy   Complete by:  As directed    Discharge instructions   Complete by:  As directed    If you notice any bleeding such as blood in stool, black tarry stools, blood in urine, nosebleeds or any other unusual bleeding,  call your doctor immediately.  Please note your medication changes as outlined in this summary.   Increase activity slowly   Complete by:  As directed      Discharge Medications   Allergies as of 09/21/2017   No Known Allergies     Medication List    STOP taking these medications   acetaminophen 500 MG tablet Commonly known as:  TYLENOL   potassium chloride 10 MEQ tablet Commonly known as:  K-DUR     TAKE these medications   ALPRAZolam 0.5 MG tablet Commonly known as:  XANAX Take 1 tablet (0.5 mg total) by mouth 2 (two) times daily as needed for anxiety (separate by 8 hours from temazepam).  amiodarone 200 MG tablet Commonly known as:  PACERONE TAKE 1 TABLET(200 MG) BY MOUTH TWICE DAILY   atorvastatin 40 MG tablet Commonly known as:  LIPITOR Take 1 tablet (40 mg total) by mouth once a week. What changed:  when to take this   carvedilol 12.5 MG tablet Commonly known as:  COREG Take 1 tablet (12.5 mg total) by mouth 2 (two) times daily with a meal. What changed:    medication strength  how much to take   finasteride 1 MG tablet Commonly known as:  PROPECIA TAKE 1 TABLET(1 MG) BY MOUTH DAILY What changed:  See the new instructions.   furosemide 40 MG tablet Commonly known as:  LASIX Take 1 tablet (40 mg total) by mouth daily.   hydroxypropyl methylcellulose / hypromellose 2.5 % ophthalmic solution Commonly known as:  ISOPTO TEARS / GONIOVISC Place 2 drops into both eyes 3 (three) times daily as needed for dry eyes.   meloxicam 15 MG tablet Commonly known as:  MOBIC TAKE 1 TABLET BY MOUTH DAILY What changed:    how much to take  how to take this  when to take this   omeprazole 20 MG capsule Commonly known as:  PRILOSEC TAKE 1 CAPSULE BY MOUTH DAILY What changed:    how much to take  how to take this  when to take this   potassium chloride SA 20 MEQ tablet Commonly known as:  K-DUR,KLOR-CON Take 2 tablets (40 mEq total) by mouth  daily. Start taking on:  09/22/2017   Rivaroxaban 15 MG Tabs tablet Commonly known as:  XARELTO Take 1 tablet (15 mg total) by mouth daily with supper. What changed:    medication strength  how much to take   temazepam 15 MG capsule Commonly known as:  RESTORIL TAKE ONE CAPSULE BY MOUTH AT BEDTIME AS NEEDED What changed:    how much to take  how to take this  when to take this       Outstanding Labs/Studies   Follow up labs are scheduled for 09/29/17. Will need BMET to trend creatinine. Will need to have TSH periodically checked. Cardiac CT and ablation are already scheduled.    Duration of Discharge Encounter   Greater than 30 minutes including physician time.  Signed, Kathyrn Drown NP-C [facility] Pager: [phone] 09/21/2017, 1:20 PM

## 2017-09-21 NOTE — Interval H&P Note (Signed)
History and Physical Interval Note:  09/21/2017 9:19 AM  Travis [name]  has presented today for surgery, with the diagnosis of AFIB  The various methods of treatment have been discussed with the patient and family. After consideration of risks, benefits and other options for treatment, the patient has consented to  Procedure(s): CARDIOVERSION (N/A) as a surgical intervention .  The patient's history has been reviewed, patient examined, no change in status, stable for surgery.  I have reviewed the patient's chart and labs.  Questions were answered to the patient's satisfaction.     Casha Estupinan

## 2017-09-21 NOTE — Transfer of Care (Signed)
Immediate Anesthesia Transfer of Care Note  Patient: Travis [name]  Procedure(s) Performed: CARDIOVERSION (N/A )  Patient Location: Endoscopy Unit  Anesthesia Type:MAC  Level of Consciousness: awake  Airway & Oxygen Therapy: Patient Spontanous Breathing and Patient connected to nasal cannula oxygen  Post-op Assessment: Report given to RN and Post -op Vital signs reviewed and stable  Post vital signs: Reviewed and stable  Last Vitals:  Vitals Value Taken Time  BP 92/58 09/21/2017 11:09 AM  Temp    Pulse    Resp 25 09/21/2017 11:09 AM  SpO2 92 % 09/21/2017 11:09 AM    Last Pain:  Vitals:   09/21/17 1109  TempSrc:   PainSc: 0-No pain         Complications: No apparent anesthesia complications

## 2017-09-21 NOTE — Anesthesia Preprocedure Evaluation (Signed)
Anesthesia Evaluation  Patient identified by MRN, date of birth, ID band Patient awake    Reviewed: Allergy & Precautions, NPO status , Patient's Chart, lab work & pertinent test results  Airway Mallampati: II  TM Distance: >3 FB Neck ROM: Full    Dental  (+) Dental Advisory Given   Pulmonary pneumonia, former smoker,    breath sounds clear to auscultation       Cardiovascular hypertension, Pt. on medications and Pt. on home beta blockers +CHF  + dysrhythmias + Valvular Problems/Murmurs AS  Rhythm:Regular Rate:Normal  08/13/17: Study Conclusions - Left ventricle: The cavity size was normal. Wall thickness was  increased in a pattern of moderate LVH. Systolic function was  moderately to severely reduced. The estimated ejection fraction  was in the range of 30% to 35%. - Aortic valve: AV is thickened, calcified with restricted motion.  Peak and mean gradients through the valve are 16 and 11 mm Hg  respectively. Dimensionless index is 0.48. 2 D imaging suggests  moderate AS. There was mild regurgitation. - Left atrium: The atrium was severely dilated. - Right ventricle: The cavity size was mildly dilated. Systolic  function was mildly to moderately reduced. - Pulmonary arteries: PA peak pressure: 46 mm Hg (S).   Neuro/Psych negative neurological ROS     GI/Hepatic Neg liver ROS, GERD  ,  Endo/Other  negative endocrine ROS  Renal/GU Renal disease     Musculoskeletal  (+) Arthritis ,   Abdominal   Peds  Hematology  (+) anemia ,   Anesthesia Other Findings   Reproductive/Obstetrics                             Lab Results  Component Value Date   WBC 7.4 09/19/2017   HGB 14.5 09/19/2017   HCT 43.0 09/19/2017   MCV 100.5 (H) 09/19/2017   PLT 150 09/19/2017   Lab Results  Component Value Date   CREATININE 1.81 (H) 09/21/2017   BUN 44 (H) 09/21/2017   NA 133 (L) 09/21/2017   K 3.4 (L)  09/21/2017   CL 98 (L) 09/21/2017   CO2 24 09/21/2017    Anesthesia Physical  Anesthesia Plan  ASA: III  Anesthesia Plan: General   Post-op Pain Management:    Induction: Intravenous  PONV Risk Score and Plan: 2 and Treatment may vary due to age or medical condition  Airway Management Planned: Mask and Natural Airway  Additional Equipment:   Intra-op Plan:   Post-operative Plan:   Informed Consent: I have reviewed the patients History and Physical, chart, labs and discussed the procedure including the risks, benefits and alternatives for the proposed anesthesia with the patient or authorized representative who has indicated his/her understanding and acceptance.     Plan Discussed with: CRNA  Anesthesia Plan Comments:         Anesthesia Quick Evaluation

## 2017-09-21 NOTE — Discharge Instructions (Addendum)
Electrical Cardioversion, Care After This sheet gives you information about how to care for yourself after your procedure. Your health care provider may also give you more specific instructions. If you have problems or questions, contact your health care provider. What can I expect after the procedure? After the procedure, it is common to have:  Some redness on the skin where the shocks were given.  Follow these instructions at home:  Do not drive for 24 hours if you were given a medicine to help you relax (sedative).  Take over-the-counter and prescription medicines only as told by your health care provider.  Ask your health care provider how to check your pulse. Check it often.  Rest for 48 hours after the procedure or as told by your health care provider.  Avoid or limit your caffeine use as told by your health care provider. Contact a health care provider if:  You feel like your heart is beating too quickly or your pulse is not regular.  You have a serious muscle cramp that does not go away. Get help right away if:  You have discomfort in your chest.  You are dizzy or you feel faint.  You have trouble breathing or you are short of breath.  Your speech is slurred.  You have trouble moving an arm or leg on one side of your body.  Your fingers or toes turn cold or blue. This information is not intended to replace advice given to you by your health care provider. Make sure you discuss any questions you have with your health care provider. Document Released: 03/02/2013 Document Revised: 12/14/2015 Document Reviewed: 11/16/2015 Elsevier Interactive Patient Education  2018 [place] on my medicine - XARELTO (Rivaroxaban)  This medication education was reviewed with me or my healthcare representative as part of my discharge preparation.   Why was Xarelto prescribed for you? Xarelto was prescribed for you to reduce the risk of a blood clot forming that can  cause a stroke if you have a medical condition called atrial fibrillation (a type of irregular heartbeat).  What do you need to know about xarelto ? Take your Xarelto ONCE DAILY at the same time every day with your evening meal. If you have difficulty swallowing the tablet whole, you may crush it and mix in applesauce just prior to taking your dose.  Take Xarelto exactly as prescribed by your doctor and DO NOT stop taking Xarelto without talking to the doctor who prescribed the medication.  Stopping without other stroke prevention medication to take the place of Xarelto may increase your risk of developing a clot that causes a stroke.  Refill your prescription before you run out.  After discharge, you should have regular check-up appointments with your healthcare provider that is prescribing your Xarelto.  In the future your dose may need to be changed if your kidney function or weight changes by a significant amount.  What do you do if you miss a dose? If you are taking Xarelto ONCE DAILY and you miss a dose, take it as soon as you remember on the same day then continue your regularly scheduled once daily regimen the next day. Do not take two doses of Xarelto at the same time or on the same day.   Important Safety Information A possible side effect of Xarelto is bleeding. You should call your healthcare provider right away if you experience any of the following: ? Bleeding from an injury or your nose that does not stop. ?  Unusual colored urine (red or dark brown) or unusual colored stools (red or black). ? Unusual bruising for unknown reasons. ? A serious fall or if you hit your head (even if there is no bleeding).  Some medicines may interact with Xarelto and might increase your risk of bleeding while on Xarelto. To help avoid this, consult your healthcare provider or pharmacist prior to using any new prescription or non-prescription medications, including herbals, vitamins,  non-steroidal anti-inflammatory drugs (NSAIDs) and supplements.  This website has more information on Xarelto: [email or web address]-benson.[email or web address]

## 2017-09-21 NOTE — Progress Notes (Signed)
Reviewed discharge instructions/medications with patient. Answered his questions. Pt is stable and ready for discharge. Pt is waiting on his ride.

## 2017-09-21 NOTE — Anesthesia Postprocedure Evaluation (Signed)
Anesthesia Post Note  Patient: Travis [name]  Procedure(s) Performed: CARDIOVERSION (N/A )     Patient location during evaluation: PACU Anesthesia Type: General Level of consciousness: awake and alert Pain management: pain level controlled Vital Signs Assessment: post-procedure vital signs reviewed and stable Respiratory status: spontaneous breathing, nonlabored ventilation, respiratory function stable and patient connected to nasal cannula oxygen Cardiovascular status: blood pressure returned to baseline and stable Postop Assessment: no apparent nausea or vomiting Anesthetic complications: no    Last Vitals:  Vitals:   09/21/17 1156 09/21/17 1315  BP: 101/63 104/72  Pulse: (!) 53 (!) 56  Resp: 20   Temp:    SpO2: 97% 96%    Last Pain:  Vitals:   09/21/17 1115  TempSrc:   PainSc: 0-No pain                 Tiajuana Amass

## 2017-09-21 NOTE — Op Note (Signed)
Procedure: Electrical Cardioversion Indications:  Atrial Fibrillation  Procedure Details:  Consent: Risks of procedure as well as the alternatives and risks of each were explained to the (patient/caregiver).  Consent for procedure obtained.  Time Out: Verified patient identification, verified procedure, site/side was marked, verified correct patient position, special equipment/implants available, medications/allergies/relevent history reviewed, required imaging and test results available.  Performed  Patient placed on cardiac monitor, pulse oximetry, supplemental oxygen as necessary.  Sedation given: Propofol60 mg IV, Dr. Deatra Canter Pacer pads placed anterior and posterior chest.  Cardioverted 1 time(s).  Cardioversion with synchronized biphasic 120J shock.  Evaluation: Findings: Post procedure EKG shows: sinus brady cardia 48 bpm, occasional PACs Complications: None Patient did tolerate procedure well.  Time Spent Directly with the Patient:  30 minutes   Travis [name] 09/21/2017, 11:10 AM

## 2017-09-21 NOTE — Progress Notes (Addendum)
Progress Note  Patient Name: Travis [name] Date of Encounter: 09/21/2017  Primary Cardiologist: Virl Axe, MD / Clearnce Hasten  Subjective   Feeling better after cardioversion.   Inpatient Medications    Scheduled Meds: . atorvastatin  40 mg Oral Q Sun  . carvedilol  25 mg Oral BID WC  . furosemide  40 mg Intravenous BID  . pantoprazole  40 mg Oral Daily  . potassium chloride  40 mEq Oral Daily  . rivaroxaban  15 mg Oral Q supper  . sodium chloride flush  3 mL Intravenous Q12H   Continuous Infusions: . sodium chloride    . sodium chloride    . amiodarone 60 mg/hr (09/21/17 0135)   PRN Meds: sodium chloride, acetaminophen, ALPRAZolam, ondansetron (ZOFRAN) IV, polyvinyl alcohol, sodium chloride flush, sodium chloride flush, temazepam   Vital Signs    Vitals:   09/21/17 1021 09/21/17 1109 09/21/17 1115 09/21/17 1138  BP: (!) 117/92 (!) 92/58 (!) 93/58 99/71  Pulse: 86  (!) 50 (!) 53  Resp: 20 (!) 25 18   Temp:      TempSrc:      SpO2: 96% 92% 95% 93%  Weight:      Height:        Intake/Output Summary (Last 24 hours) at 09/21/2017 1147 Last data filed at 09/21/2017 1106 Gross per 24 hour  Intake 512.64 ml  Output 150 ml  Net 362.64 ml   Filed Weights   09/19/17 0525 09/20/17 0618 09/21/17 0540  Weight: 178 lb (80.7 kg) 179 lb 1.6 oz (81.2 kg) 180 lb 4.8 oz (81.8 kg)    Telemetry    afib at rate of 70s - Personally Reviewed  ECG    N/A  Physical Exam   GEN: No acute distress.   Neck: No JVD Cardiac: Ir IR , no murmurs, rubs, or gallops.  Respiratory: Clear to auscultation bilaterally. GI: Soft, nontender, non-distended  MS: No edema; No deformity. Neuro:  Nonfocal  Psych: Normal affect   Labs    Chemistry Recent Labs  Lab 09/19/17 0620 09/20/17 0517 09/21/17 0701  NA 133* 134* 133*  K 3.4* 3.6 3.4*  CL 95* 99* 98*  CO2 25 26 24   GLUCOSE 126* 126* 129*  BUN 41* 42* 44*  CREATININE 1.56* 1.69* 1.81*  CALCIUM 8.8* 8.8* 8.9  GFRNONAA 41*  37* 34*  GFRAA 48* 43* 40*  ANIONGAP 13 9 11      Hematology Recent Labs  Lab 09/17/17 0846 09/19/17 1321  WBC 7.3 7.4  RBC 4.04* 4.28  HGB 13.5 14.5  HCT 39.7 43.0  MCV 98.3 100.5*  MCH 33.4 33.9  MCHC 34.0 33.7  RDW 14.4 14.7  PLT 161 150    Recent Labs  Lab 09/17/17 0915  TROPIPOC 0.02     BNP Recent Labs  Lab 09/17/17 1313  BNP 1,399.3*    Radiology    No results found.  Cardiac Studies   Echo 08/13/2017 Study Conclusions  - Left ventricle: The cavity size was normal. Wall thickness was   increased in a pattern of moderate LVH. Systolic function was   moderately to severely reduced. The estimated ejection fraction   was in the range of 30% to 35%. - Aortic valve: AV is thickened, calcified with restricted motion.   Peak and mean gradients through the valve are 16 and 11 mm Hg   respectively. Dimensionless index is 0.48. 2 D imaging suggests   moderate AS. There was mild regurgitation. -  Left atrium: The atrium was severely dilated. - Right ventricle: The cavity size was mildly dilated. Systolic   function was mildly to moderately reduced. - Pulmonary arteries: PA peak pressure: 46 mm Hg (S).    Patient Profile     Wylie Coon Meltonis a 77 y.o.malewith a hx of PAF, and pulmonary edema in 07/2017 due to a fib with RVR and associated with acute respiratory failure, functionally bicuspid aortic valve mod AS and mild AR, CM with EF 30-35%, GERD, and failure of Phyllis Ginger -amiodarone was started on 09/02/17 with plan for ablation 10/06/17 and cardiac CTA 5/9/19who presented 4/25 for evaluation of SOB with elevated HR.   Assessment & Plan    1. Acute on chronic systolic CHF   Pt doing poorly  Poor forward outpt as indicated by Increased Cr, now improving 1.8->1.56-->1.69-> 1.81 - discharge home today, switch to lasix 40 mg po daily. Continue KCl 40 mEq daily, he is scheduled for labs on 5/7, we will arrange for TOC 7 follow up with Roderic Palau.  He is  scheduled for a RFA with Dr Rayann Heman on  10/06/17  2. Cardiomyopathy - LVEF of 30-35%. Likely tachy induced. No chest pain. Cardiac CTA 10/01/17.   3. AKI - SCr as above. Follow closely.   4. Persistent atrial fibrillation - Failed Tikosyn.Was on PO amio for a few wks  Tachycardic yesterday in ED Started on IV amiodarone this admission to hasten load, improved rates now in 70-80'.  - switch to PO amiodarone 200 mg BID until the next follow up with Roderic Palau, then transition to amiodarone 200 mg po daily - decrease carvedilol to 12.5 mg po BID as he is bradycardic and hypotensive - s/p successful cardioversion this am  - Plan for ablation 10/06/17.  5. Elevated TSH  5.1  Minimal - Followed closely while on amiodarone.   6. Functionally bicuspid aortic valve mild/ mod AS and mild AR - Follow with periodic echo   For questions or updates, please contact [place] Please consult [email or web address] for contact info under Cardiology/STEMI.      Signed, Ena Dawley, MD  09/21/2017, 11:47 AM

## 2017-09-25 ENCOUNTER — Ambulatory Visit: Payer: Self-pay | Admitting: *Deleted

## 2017-09-25 ENCOUNTER — Telehealth: Payer: Self-pay | Admitting: Family Medicine

## 2017-09-25 ENCOUNTER — Other Ambulatory Visit: Payer: Self-pay | Admitting: Family Medicine

## 2017-09-25 DIAGNOSIS — R069 Unspecified abnormalities of breathing: Secondary | ICD-10-CM | POA: Diagnosis not present

## 2017-09-25 NOTE — Telephone Encounter (Signed)
Note sent to Dr. Yong Channel as request received from pharmacy foe Xanax

## 2017-09-25 NOTE — Telephone Encounter (Signed)
Pt reports he called EMS this afternoon for SOB. H/O A-fib. [facility] told him "Everything was good" that he was in NSR, and that one of his Xanax would help.  He reports he did take one and "feels good now; helped a lot."  He is requesting enough Xanax to get him though the weekend. He was Rx 5 tabs on 08/04/17.  Pt's # [phone]  [facility] [id number], [place] - [address]

## 2017-09-25 NOTE — Telephone Encounter (Signed)
Ill send in #5 but we need to get him in next week to evaluate anxiety

## 2017-09-25 NOTE — Telephone Encounter (Signed)
See note

## 2017-09-25 NOTE — Telephone Encounter (Signed)
Pt reports he called EMS this afternoon for SOB. H/O A-fib. [facility] told him "Everything was good" that he was in NSR, and that one of his Xanax would help.  He reports he did take one and "feels good now; helped a lot."  He is requesting enough Xanax to get him though the weekend. He was Rx 5 tabs on 08/04/17.  Pt's # [phone]  [facility] [id number], [place] - [address]

## 2017-09-28 ENCOUNTER — Ambulatory Visit (HOSPITAL_COMMUNITY): Payer: Medicare Other | Admitting: Nurse Practitioner

## 2017-09-29 ENCOUNTER — Encounter: Payer: Self-pay | Admitting: Family Medicine

## 2017-09-29 ENCOUNTER — Ambulatory Visit (INDEPENDENT_AMBULATORY_CARE_PROVIDER_SITE_OTHER): Payer: Medicare Other | Admitting: Family Medicine

## 2017-09-29 ENCOUNTER — Other Ambulatory Visit: Payer: Medicare Other

## 2017-09-29 VITALS — BP 110/68 | HR 56 | Ht 68.0 in | Wt 191.6 lb

## 2017-09-29 DIAGNOSIS — I481 Persistent atrial fibrillation: Secondary | ICD-10-CM | POA: Diagnosis not present

## 2017-09-29 DIAGNOSIS — E871 Hypo-osmolality and hyponatremia: Secondary | ICD-10-CM

## 2017-09-29 DIAGNOSIS — I10 Essential (primary) hypertension: Secondary | ICD-10-CM | POA: Diagnosis not present

## 2017-09-29 DIAGNOSIS — N179 Acute kidney failure, unspecified: Secondary | ICD-10-CM

## 2017-09-29 DIAGNOSIS — I4819 Other persistent atrial fibrillation: Secondary | ICD-10-CM

## 2017-09-29 DIAGNOSIS — I5043 Acute on chronic combined systolic (congestive) and diastolic (congestive) heart failure: Secondary | ICD-10-CM | POA: Diagnosis not present

## 2017-09-29 DIAGNOSIS — I480 Paroxysmal atrial fibrillation: Secondary | ICD-10-CM | POA: Diagnosis not present

## 2017-09-29 NOTE — Telephone Encounter (Signed)
Patient scheduled for 09/29/2017 at 3:45pm

## 2017-09-29 NOTE — Assessment & Plan Note (Signed)
Hyponatremia resolved on labs.  Will resolve the issue

## 2017-09-29 NOTE — Assessment & Plan Note (Signed)
He has had some recent adjustments to medication.  He is now only on Carvedilol 12.5 mg twice daily, Lasix 40 mg daily.  Blood pressure remains well controlled.  It is a good thing he is off amlodipine due to his heart failure issues.

## 2017-09-29 NOTE — Progress Notes (Signed)
Subjective:  Travis [name] is a 77 y.o. year old very pleasant male patient who presents for/with See problem oriented charting ROS-admits to increased edema, weight gain, periods of shortness of breath.  Admits to anxiety when he feels shortness of breath which resolves with Xanax  Past Medical History-  Patient Active Problem List   Diagnosis Date Noted  . Acute on chronic combined systolic and diastolic CHF (congestive heart failure) ([place]) 08/12/2017    Priority: High  . Pulmonary hypertension ([place]) 03/09/2012    Priority: High  . Bicuspid aortic valve     Priority: High  . Paroxysmal atrial fibrillation (HCC)     Priority: High  . Cardiomyopathy, rate related with resolution now recurrent 12/08/2011    Priority: High  . Hyperlipidemia 12/19/2014    Priority: Medium  . Anxiety state 03/06/2014    Priority: Medium  . Insomnia 03/06/2014    Priority: Medium  . Sinus bradycardia 01/04/2013    Priority: Medium  . Hyponatremia 02/21/2012    Priority: Medium  . Hypertension     Priority: Medium  . Left bundle branch block 12/10/2010    Priority: Medium  . Macrocytic anemia 08/01/2015    Priority: Low  . Male pattern baldness 03/06/2014    Priority: Low  . Overweight (BMI 25.0-29.9) 02/08/2014    Priority: Low  . S/P TKR (total knee replacement) 02/08/2014    Priority: Low  . DRY MOUTH 11/07/2009    Priority: Low  . Carotid bruit 07/05/2009    Priority: Low  . GERD 01/07/2008    Priority: Low  . Osteoarthritis 02/04/2007    Priority: Low  . GANGLION CYST, WRIST, LEFT 02/04/2007    Priority: Low  . AKI (acute kidney injury) ([place]) 09/21/2017    Medications- reviewed and updated Current Outpatient Medications  Medication Sig Dispense Refill  . ALPRAZolam (XANAX) 0.5 MG tablet TAKE 1 TABLET BY MOUTH TWICE DAILY AS NEEDED FOR ANXIETY( SEPARATE BY 8 HOURS FROM TEMAZEPAM) 5 tablet 0  . amiodarone (PACERONE) 200 MG tablet TAKE 1 TABLET(200 MG) BY MOUTH TWICE DAILY 60  tablet 0  . atorvastatin (LIPITOR) 40 MG tablet Take 1 tablet (40 mg total) by mouth once a week. (Patient taking differently: Take 40 mg by mouth every Sunday. ) 14 tablet 3  . carvedilol (COREG) 12.5 MG tablet Take 1 tablet (12.5 mg total) by mouth 2 (two) times daily with a meal. 60 tablet 3  . finasteride (PROPECIA) 1 MG tablet TAKE 1 TABLET(1 MG) BY MOUTH DAILY (Patient taking differently: Take 1 mg by mouth once a day) 30 tablet 5  . furosemide (LASIX) 40 MG tablet Take 1 tablet (40 mg total) by mouth daily. 30 tablet 3  . hydroxypropyl methylcellulose / hypromellose (ISOPTO TEARS / GONIOVISC) 2.5 % ophthalmic solution Place 2 drops into both eyes 3 (three) times daily as needed for dry eyes.    . meloxicam (MOBIC) 15 MG tablet TAKE 1 TABLET BY MOUTH DAILY (Patient taking differently: Take 15 mg by mouth once a day) 90 tablet 0  . omeprazole (PRILOSEC) 20 MG capsule TAKE 1 CAPSULE BY MOUTH DAILY (Patient taking differently: Take 20 mg by mouth once a day) 90 capsule 0  . potassium chloride SA (K-DUR,KLOR-CON) 20 MEQ tablet Take 2 tablets (40 mEq total) by mouth daily. 60 tablet 2  . Rivaroxaban (XARELTO) 15 MG TABS tablet Take 1 tablet (15 mg total) by mouth daily with supper. 60 tablet 2  . temazepam (RESTORIL) 15 MG  capsule TAKE ONE CAPSULE BY MOUTH AT BEDTIME AS NEEDED (Patient taking differently: Take 15 mg by mouth at bedtime as needed for sleep) 30 capsule 2   No current facility-administered medications for this visit.     Objective: BP 110/68 (BP Location: Left Arm, Patient Position: Sitting, Cuff Size: Large)   Pulse (!) 56   Ht 5\' 8"  (1.727 m)   Wt 191 lb 9.6 oz (86.9 kg)   SpO2 94%   BMI 29.13 kg/m  Gen: NAD, resting comfortably, seems to breath heavier than normal when getting onto table JVD noted 4 fingerbreadth's above clavicle CV: RRR no murmurs rubs or gallops Lungs: CTAB no crackles, wheeze, rhonchi Abdomen: soft/nontender/nondistended/normal bowel sounds.  Ext: 2+  edema Skin: warm, dry  Assessment/Plan:  Acute on chronic combined systolic and diastolic CHF (congestive heart failure) (HCC)  Paroxysmal atrial fibrillation (HCC)  Acute hyponatremia  AKI (acute kidney injury) ([place])  Essential hypertension Subjective:  has been on tikosyn for long time and eventually stopped working. Has had multiple changes to medications and was on amiodarone more recently. DCCV done on the 29th and he has remained in sinus rhythm. Planning on ablation in 1 week as well.  He dealt with CHF while hospitalized requiring diuresis with IV Lasix.  This past Friday afternoon he couldn't breath very well and he called EMS- he had reassuring vital signs. They did an EKG and was apparently in sinus rhythm. Oxygen was in 90s- but he felt like he was having trouble breathing. He took a xanax and he felt better within a half an hour- EMS left at that point telling him they thought this was anxiety.   Patient called in before the weekend requesting Xanax.  With extra 5 we gave him over the weekend- he now has 7.5 pills total left. Took a full one on a day this weekend and he felt very tired. Then took half this morning when felt some shortness of breath and symptoms resolved within 30 minutes.   Since leaving the hospital he has had constipation. He has done miralax once a day for 2 days. Has tried fleet enema. Has tried milk of magnesia. Has not had a bowel movement since getting out of the hospital.   His weight is up 10 lbs since coming home. His edema has increased now up to 2+   For 2 months he has felt mildlynauseous . Sees GI end of June. zofran not ideal with QT over 500  Uses cane and requests handicap placard A/P: Acute on chronic combined systolic and diastolic CHF (congestive heart failure) ([place]) I am concerned that patient is once again fluid overloaded.  His weight is up 10 pounds, he has significant edema, he is having spells of shortness of breath, he has JVD on  exam.  We will increase his Lasix to 40 mg twice daily today.  Luckily he is already scheduled to see cardiology tomorrow and we can get them to weigh in as well.  Patient knows if he has worsening symptoms that he should present to the emergency room.  While I believe CHF is at play, I do believe there is an element of anxiety.  I am okay with him using half a Xanax as needed and I would be willing to refill this.  Paroxysmal atrial fibrillation (HCC) Patient appears to be in sinus rhythm though obviously cannot tell this formally without EKG. he remains on Xarelto for anticoagulation at reduced dose due to kidney function.  He  is also on carvedilol for rate control if needed.  He is also on amiodarone.  He is thankful that he has his ablation scheduled with [name] next week.  He has follow-up with cardiology tomorrow  Acute hyponatremia Hyponatremia resolved on labs.  Will resolve the issue  AKI (acute kidney injury) ([place]) GFR reduced into the 30s during hospitalization but has responded back up to 72 or so.  This may be temporarily stressed as we increase diuresis  Hypertension He has had some recent adjustments to medication.  He is now only on Carvedilol 12.5 mg twice daily, Lasix 40 mg daily.  Blood pressure remains well controlled.  It is a good thing he is off amlodipine due to his heart failure issues.   Future Appointments  Date Time Provider [place]  09/30/2017 11:30 AM Sherran Needs, NP [facility] None  10/01/2017  9:30 AM [facility] 1 [facility]  10/01/2017 10:00 AM [facility] 1 [facility]  11/09/2017 11:30 AM Sherran Needs, NP [facility] None  11/18/2017  9:45 AM Milus Banister, MD [facility]  01/11/2018 11:00 AM [name], MD [facility]  02/04/2018  2:00 PM Williemae Area, RN [facility]   Cardiology follow-up tomorrow.  I am happy to see him as needed.  We are to have an annual wellness visit set up with Cassie in September as well  Return  precautions advised.  [name], MD

## 2017-09-29 NOTE — Patient Instructions (Addendum)
Take 2 capfuls of miralax today and tomorrow- you can divide them morning and evening. Let me know if no bowel movement by Thursday.   I think using the xanax half tablet is reasonable but if your symptoms do not respond within 15-20 minutes then you need to seek care immediately  Take an extra lasix today. Lets see what cardiology thinks but I think you have way too much fluid and this could be causing some of your shortness of breath.

## 2017-09-29 NOTE — Assessment & Plan Note (Signed)
GFR reduced into the 30s during hospitalization but has responded back up to 45 or so.  This may be temporarily stressed as we increase diuresis

## 2017-09-29 NOTE — Assessment & Plan Note (Signed)
Patient appears to be in sinus rhythm though obviously cannot tell this formally without EKG. he remains on Xarelto for anticoagulation at reduced dose due to kidney function.  He is also on carvedilol for rate control if needed.  He is also on amiodarone.  He is thankful that he has his ablation scheduled with [name] next week.  He has follow-up with cardiology tomorrow

## 2017-09-29 NOTE — Assessment & Plan Note (Signed)
I am concerned that patient is once again fluid overloaded.  His weight is up 10 pounds, he has significant edema, he is having spells of shortness of breath, he has JVD on exam.  We will increase his Lasix to 40 mg twice daily today.  Luckily he is already scheduled to see cardiology tomorrow and we can get them to weigh in as well.  Patient knows if he has worsening symptoms that he should present to the emergency room.  While I believe CHF is at play, I do believe there is an element of anxiety.  I am okay with him using half a Xanax as needed and I would be willing to refill this.

## 2017-09-30 ENCOUNTER — Ambulatory Visit (HOSPITAL_COMMUNITY)
Admission: RE | Admit: 2017-09-30 | Discharge: 2017-09-30 | Disposition: A | Discharge status: Home / Self Care | Payer: Medicare Other | Source: Ambulatory Visit | Attending: Nurse Practitioner | Admitting: Nurse Practitioner

## 2017-09-30 ENCOUNTER — Encounter (HOSPITAL_COMMUNITY): Payer: Self-pay | Admitting: Nurse Practitioner

## 2017-09-30 VITALS — BP 118/72 | HR 58 | Ht 68.0 in | Wt 190.0 lb

## 2017-09-30 DIAGNOSIS — Z951 Presence of aortocoronary bypass graft: Secondary | ICD-10-CM | POA: Diagnosis not present

## 2017-09-30 DIAGNOSIS — I700 Atherosclerosis of aorta: Secondary | ICD-10-CM | POA: Diagnosis not present

## 2017-09-30 DIAGNOSIS — J810 Acute pulmonary edema: Secondary | ICD-10-CM | POA: Diagnosis not present

## 2017-09-30 DIAGNOSIS — Z87891 Personal history of nicotine dependence: Secondary | ICD-10-CM | POA: Insufficient documentation

## 2017-09-30 DIAGNOSIS — I480 Paroxysmal atrial fibrillation: Secondary | ICD-10-CM | POA: Diagnosis not present

## 2017-09-30 DIAGNOSIS — I481 Persistent atrial fibrillation: Secondary | ICD-10-CM | POA: Insufficient documentation

## 2017-09-30 DIAGNOSIS — J90 Pleural effusion, not elsewhere classified: Secondary | ICD-10-CM | POA: Insufficient documentation

## 2017-09-30 DIAGNOSIS — Z96653 Presence of artificial knee joint, bilateral: Secondary | ICD-10-CM | POA: Diagnosis not present

## 2017-09-30 DIAGNOSIS — I447 Left bundle-branch block, unspecified: Secondary | ICD-10-CM | POA: Insufficient documentation

## 2017-09-30 DIAGNOSIS — R001 Bradycardia, unspecified: Secondary | ICD-10-CM | POA: Insufficient documentation

## 2017-09-30 LAB — CBC WITH DIFFERENTIAL/PLATELET
BASOS ABS: 0 10*3/uL (ref 0.0–0.2)
Basos: 0 %
EOS (ABSOLUTE): 0.5 10*3/uL — AB (ref 0.0–0.4)
Eos: 5 %
HEMATOCRIT: 40.4 % (ref 37.5–51.0)
HEMOGLOBIN: 13.4 g/dL (ref 13.0–17.7)
IMMATURE GRANS (ABS): 0 10*3/uL (ref 0.0–0.1)
Immature Granulocytes: 0 %
LYMPHS: 16 %
Lymphocytes Absolute: 1.6 10*3/uL (ref 0.7–3.1)
MCH: 32.8 pg (ref 26.6–33.0)
MCHC: 33.2 g/dL (ref 31.5–35.7)
MCV: 99 fL — ABNORMAL HIGH (ref 79–97)
Monocytes Absolute: 1.1 10*3/uL — ABNORMAL HIGH (ref 0.1–0.9)
Monocytes: 11 %
NEUTROS ABS: 6.7 10*3/uL (ref 1.4–7.0)
Neutrophils: 68 %
Platelets: 196 10*3/uL (ref 150–379)
RBC: 4.08 x10E6/uL — ABNORMAL LOW (ref 4.14–5.80)
RDW: 14.7 % (ref 12.3–15.4)
WBC: 10 10*3/uL (ref 3.4–10.8)

## 2017-09-30 LAB — BASIC METABOLIC PANEL
BUN/Creatinine Ratio: 21 (ref 10–24)
BUN: 31 mg/dL — ABNORMAL HIGH (ref 8–27)
CALCIUM: 8.9 mg/dL (ref 8.6–10.2)
CHLORIDE: 99 mmol/L (ref 96–106)
CO2: 19 mmol/L — AB (ref 20–29)
Creatinine, Ser: 1.47 mg/dL — ABNORMAL HIGH (ref 0.76–1.27)
GFR calc non Af Amer: 45 mL/min/{1.73_m2} — ABNORMAL LOW (ref >59)
GFR, EST AFRICAN AMERICAN: 52 mL/min/{1.73_m2} — AB (ref >59)
GLUCOSE: 126 mg/dL — AB (ref 65–99)
POTASSIUM: 5.2 mmol/L (ref 3.5–5.2)
Sodium: 136 mmol/L (ref 134–144)

## 2017-09-30 NOTE — Patient Instructions (Signed)
Continue lasix twice a day until we see you Friday

## 2017-09-30 NOTE — Progress Notes (Signed)
Primary Care Physician: [name], MD Referring Physician: Dr. Nance Pear is a 77 y.o. male with a h/o paroxysmal afib, that was in the hospital 3/20 with acute pulmonary edema 2/2 to afib with RVR. Pt has been on tikosyn for some time. This was continued and carvedilol was increased to 25 mg bid. EF by TEE was 30 % and he was successfully cardioverted. He followed with [name] 3/27 and was back in afib.He started ranolazine and was set pt up for another cardioversion. He had  self converted at time of cardioversion, however he was  back in afib at 140 bpm on f/u in afib clinic, 4/8.  Felt he was not tolerating ranolazine. [name] had set him up to see [name] for possible ablation. 4/10, and also per his note, it tikosyn/ranolazine should not keep him in rhythm, then d/c tikosyn and start amiodarone. Left atrial size is 51 mm.  F/u in afib clinic, Since I saw pt, he was seen by [name], amio was started after Tikosyn washout and he was scheduled for ablation 5/14.However, he presented to the hospital 4/25 with acute on chronic heart failure. Was given amio IV and cardioverted successfully and diuresed. He saw Dr. Yong Channel yesterday and weight was up +10 lbs, having spells of shortness of breath. Taking some xanax intermittently for this, pt feels may be anxiety component. He is in SR, but can't understand why he is not feeling any better and he is fluid overloaded. Dr. Yong Channel increased laisx to 40 mg bid yesterday and has lost one lb. Bmet yesterday showed creatinine at 1.47, down from 1.87 in hospital..  Today, he denies symptoms of palpitations, chest pain, shortness of breath, orthopnea, PND,   dizziness, presyncope, syncope, or neurologic sequela.+ for pedal edema, shortness of breath, weakness. The patient is tolerating medications without difficulties and is otherwise without complaint today.   Past Medical History:  Diagnosis Date  . Acute kidney injury ([place])   .  Arthritis   . Bicuspid aortic valve   . Cardiomyopathy -resolved    tachycardia-induced, EF 55% June 2009  . CHF (congestive heart failure) ([place])    JULY 2014  . GERD (gastroesophageal reflux disease)   . History of cardioversion 12/16/2012  . History of stomach ulcers   . Hypertension   . Left bundle branch block   . Persistent atrial fibrillation ([place])    multiple prior cardioversion  . Sinus bradycardia   . Status post clamping of cerebral aneurysm 80   Past Surgical History:  Procedure Laterality Date  . CARDIAC CATHETERIZATION    . CARDIOVERSION N/A 12/16/2012   Procedure: CARDIOVERSION;  Surgeon: Lelon Perla, MD;  Location[facility] ENDOSCOPY;  Service: Cardiovascular;  Laterality: N/A;  . CARDIOVERSION N/A 08/14/2017   Procedure: CARDIOVERSION;  Surgeon: Josue Hector, MD;  Location: [facility] ENDOSCOPY;  Service: Cardiovascular;  Laterality: N/A;  . CARDIOVERSION N/A 09/21/2017   Procedure: CARDIOVERSION;  Surgeon: Sanda Klein, MD;  Location: [facility] ENDOSCOPY;  Service: Cardiovascular;  Laterality: N/A;  . CATARACT EXTRACTION  05/2015   bilateral  . cerebral anuersym post clips    . fractured left arm    . HERNIA REPAIR     lft  . INGUINAL HERNIA REPAIR  07/02/2011   Procedure: LAPAROSCOPIC INGUINAL HERNIA;  Surgeon: Harl Bowie, MD;  Location: [place];  Service: General;  Laterality: Left;  Laparoscopic left inguinal hernia repair and mesh  . left tendon repair  lft foot  . ROTATOR CUFF REPAIR     lf  . TEE WITHOUT CARDIOVERSION N/A 08/14/2017   Procedure: TRANSESOPHAGEAL ECHOCARDIOGRAM (TEE);  Surgeon: Josue Hector, MD;  Location: [facility] ENDOSCOPY;  Service: Cardiovascular;  Laterality: N/A;  . TONSILLECTOMY    . TOTAL KNEE ARTHROPLASTY Bilateral 02/06/2014   Procedure: TOTAL KNEE BILATERAL;  Surgeon: Mauri Pole, MD;  Location: [facility] ORS;  Service: Orthopedics;  Laterality: Bilateral;  . WRIST GANGLION EXCISION     lft    Current Outpatient Medications  Medication  Sig Dispense Refill  . ALPRAZolam (XANAX) 0.5 MG tablet TAKE 1 TABLET BY MOUTH TWICE DAILY AS NEEDED FOR ANXIETY( SEPARATE BY 8 HOURS FROM TEMAZEPAM) 5 tablet 0  . amiodarone (PACERONE) 200 MG tablet TAKE 1 TABLET(200 MG) BY MOUTH TWICE DAILY 60 tablet 0  . atorvastatin (LIPITOR) 40 MG tablet Take 1 tablet (40 mg total) by mouth once a week. (Patient taking differently: Take 40 mg by mouth every Sunday. ) 14 tablet 3  . carvedilol (COREG) 12.5 MG tablet Take 1 tablet (12.5 mg total) by mouth 2 (two) times daily with a meal. 60 tablet 3  . finasteride (PROPECIA) 1 MG tablet TAKE 1 TABLET(1 MG) BY MOUTH DAILY (Patient taking differently: Take 1 mg by mouth once a day) 30 tablet 5  . furosemide (LASIX) 40 MG tablet Take 1 tablet (40 mg total) by mouth daily. 30 tablet 3  . hydroxypropyl methylcellulose / hypromellose (ISOPTO TEARS / GONIOVISC) 2.5 % ophthalmic solution Place 2 drops into both eyes 3 (three) times daily as needed for dry eyes.    . meloxicam (MOBIC) 15 MG tablet TAKE 1 TABLET BY MOUTH DAILY (Patient taking differently: Take 15 mg by mouth once a day) 90 tablet 0  . omeprazole (PRILOSEC) 20 MG capsule TAKE 1 CAPSULE BY MOUTH DAILY (Patient taking differently: Take 20 mg by mouth once a day) 90 capsule 0  . potassium chloride SA (K-DUR,KLOR-CON) 20 MEQ tablet Take 2 tablets (40 mEq total) by mouth daily. 60 tablet 2  . Rivaroxaban (XARELTO) 15 MG TABS tablet Take 1 tablet (15 mg total) by mouth daily with supper. 60 tablet 2  . temazepam (RESTORIL) 15 MG capsule TAKE ONE CAPSULE BY MOUTH AT BEDTIME AS NEEDED (Patient taking differently: Take 15 mg by mouth at bedtime as needed for sleep) 30 capsule 2   No current facility-administered medications for this encounter.     No Known Allergies  Social History   Socioeconomic History  . Marital status: Single    Spouse name: Not on file  . Number of children: 0  . Years of education: Not on file  . Highest education level: Not on file    Occupational History  . Occupation: Retired  Scientific laboratory technician  . Financial resource strain: Not on file  . Food insecurity:    Worry: Not on file    Inability: Not on file  . Transportation needs:    Medical: Not on file    Non-medical: Not on file  Tobacco Use  . Smoking status: Former Smoker    Packs/day: 1.50    Years: 25.00    Pack years: 37.50    Last attempt to quit: 05/26/1986    Years since quitting: 31.3  . Smokeless tobacco: Never Used  Substance and Sexual Activity  . Alcohol use: Yes    Alcohol/week: 0.5 oz    Types: 1 Standard drinks or equivalent per week    Comment: stopped drinking   .  Drug use: No  . Sexual activity: Not Currently  Lifestyle  . Physical activity:    Days per week: Not on file    Minutes per session: Not on file  . Stress: Not on file  Relationships  . Social connections:    Talks on phone: Not on file    Gets together: Not on file    Attends religious service: Not on file    Active member of club or organization: Not on file    Attends meetings of clubs or organizations: Not on file    Relationship status: Not on file  . Intimate partner violence:    Fear of current or ex partner: Not on file    Emotionally abused: Not on file    Physically abused: Not on file    Forced sexual activity: Not on file  Other Topics Concern  . Not on file  Social History Narrative   Homosexual. Lives alone. Not sexually active.       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 Stroke Father 64       smoker  . Lung cancer Mother 40       former smoker  . Stroke Mother   . Stroke Brother   . Colon cancer Neg Hx     ROS- All systems are reviewed and negative except as per the HPI above  Physical Exam: Vitals:   09/30/17 1132  BP: 118/72  Pulse: (!) 58  SpO2: 91%  Weight: 190 lb (86.2 kg)  Height: 5'  8" (1.727 m)   Wt Readings from Last 3 Encounters:  09/30/17 190 lb (86.2 kg)  09/29/17 191 lb 9.6 oz (86.9 kg)  09/21/17 180 lb 4.8 oz (81.8 kg)    Labs: Lab Results  Component Value Date   NA 136 09/29/2017   K 5.2 09/29/2017   CL 99 09/29/2017   CO2 19 (L) 09/29/2017   GLUCOSE 126 (H) 09/29/2017   BUN 31 (H) 09/29/2017   CREATININE 1.47 (H) 09/29/2017   CALCIUM 8.9 09/29/2017   MG 1.7 09/17/2017   Lab Results  Component Value Date   INR 2.05 09/17/2017   Lab Results  Component Value Date   CHOL 133 08/14/2017   HDL 57 08/14/2017   LDLCALC 65 08/14/2017   TRIG 54 08/14/2017     GEN- The patient is well appearing, alert and oriented x 3 today.   Head- normocephalic, atraumatic Eyes-  Sclera clear, conjunctiva pink Ears- hearing intact Oropharynx- clear Neck- supple, no JVP Lymph- no cervical lymphadenopathy Lungs- Clear to ausculation bilaterally, normal work of breathing Heart- Regular rate and rhythm, no murmurs, rubs or gallops, PMI not laterally displaced GI- abdomen distended , NT, ND, + BS Extremities- no clubbing, cyanosis, 2+ pitting edema MS- no significant deformity or atrophy Skin- no rash or lesion Psych- euthymic mood, full affect Neuro- strength and sensation are intact  EKG- SInus brady at 58 bpm, PR int 190 ms, qrs int 142 ms, qtc 449 ms Epic records reviewed Echo-Study Conclusions  - Left ventricle: The cavity size was normal. Wall thickness was   increased in a pattern of moderate LVH. Systolic function was   moderately to severely reduced. The estimated ejection fraction   was in the range of 30% to 35%. - Aortic valve: AV is thickened,  calcified with restricted motion.   Peak and mean gradients through the valve are 16 and 11 mm Hg   respectively. Dimensionless index is 0.48. 2 D imaging suggests   moderate AS. There was mild regurgitation. - Left atrium: The atrium was severely dilated, 51 mm with LA volume of 143 ml - Right  ventricle: The cavity size was mildly dilated. Systolic   function was mildly to moderately reduced. - Pulmonary arteries: PA peak pressure: 46 mm Hg (S).     Assessment and Plan: 1. Afib with RVR Failed tikosyn Now on amiodarone at 200 mg bid In SR Continue carvedilol at 12.5 mg bid We discussed that amio at 200 mg bid may be contributing to shortness of breath but pt does not want to risk reducing the dose to 1x a day, if there is a chance he could go back into aib  2.  Acute on chronic HF/LV dysfunction, EF 30-35% Probably TMC Weight is up around 9-10 lbs Continue bid lasix 40 mg  He will return on Friday for weight and bmet, will decide then if he can continue bid dose He was told to weigh daily   To ER if breathing significantly worsens  Ablation pending 5/12, cardiac CT pending tomorrow  Butch Penny C. Shloma Roggenkamp, [facility] [address] [place], [place] [address] [phone]

## 2017-09-30 NOTE — H&P (View-Only) (Signed)
Primary Care Physician: [name], MD Referring Physician: Dr. Nance Pear is a 77 y.o. male with a h/o paroxysmal afib, that was in the hospital 3/20 with acute pulmonary edema 2/2 to afib with RVR. Pt has been on tikosyn for some time. This was continued and carvedilol was increased to 25 mg bid. EF by TEE was 30 % and he was successfully cardioverted. He followed with [name] 3/27 and was back in afib.He started ranolazine and was set pt up for another cardioversion. He had  self converted at time of cardioversion, however he was  back in afib at 140 bpm on f/u in afib clinic, 4/8.  Felt he was not tolerating ranolazine. [name] had set him up to see [name] for possible ablation. 4/10, and also per his note, it tikosyn/ranolazine should not keep him in rhythm, then d/c tikosyn and start amiodarone. Left atrial size is 51 mm.  F/u in afib clinic, Since I saw pt, he was seen by [name], amio was started after Tikosyn washout and he was scheduled for ablation 5/14.However, he presented to the hospital 4/25 with acute on chronic heart failure. Was given amio IV and cardioverted successfully and diuresed. He saw Dr. Yong Channel yesterday and weight was up +10 lbs, having spells of shortness of breath. Taking some xanax intermittently for this, pt feels may be anxiety component. He is in SR, but can't understand why he is not feeling any better and he is fluid overloaded. Dr. Yong Channel increased laisx to 40 mg bid yesterday and has lost one lb. Bmet yesterday showed creatinine at 1.47, down from 1.87 in hospital..  Today, he denies symptoms of palpitations, chest pain, shortness of breath, orthopnea, PND,   dizziness, presyncope, syncope, or neurologic sequela.+ for pedal edema, shortness of breath, weakness. The patient is tolerating medications without difficulties and is otherwise without complaint today.   Past Medical History:  Diagnosis Date  . Acute kidney injury ([place])   .  Arthritis   . Bicuspid aortic valve   . Cardiomyopathy -resolved    tachycardia-induced, EF 55% June 2009  . CHF (congestive heart failure) ([place])    JULY 2014  . GERD (gastroesophageal reflux disease)   . History of cardioversion 12/16/2012  . History of stomach ulcers   . Hypertension   . Left bundle branch block   . Persistent atrial fibrillation ([place])    multiple prior cardioversion  . Sinus bradycardia   . Status post clamping of cerebral aneurysm 80   Past Surgical History:  Procedure Laterality Date  . CARDIAC CATHETERIZATION    . CARDIOVERSION N/A 12/16/2012   Procedure: CARDIOVERSION;  Surgeon: Lelon Perla, MD;  Location: [facility] ENDOSCOPY;  Service: Cardiovascular;  Laterality: N/A;  . CARDIOVERSION N/A 08/14/2017   Procedure: CARDIOVERSION;  Surgeon: Josue Hector, MD;  Location: [facility] ENDOSCOPY;  Service: Cardiovascular;  Laterality: N/A;  . CARDIOVERSION N/A 09/21/2017   Procedure: CARDIOVERSION;  Surgeon: Sanda Klein, MD;  Location: [facility] ENDOSCOPY;  Service: Cardiovascular;  Laterality: N/A;  . CATARACT EXTRACTION  05/2015   bilateral  . cerebral anuersym post clips    . fractured left arm    . HERNIA REPAIR     lft  . INGUINAL HERNIA REPAIR  07/02/2011   Procedure: LAPAROSCOPIC INGUINAL HERNIA;  Surgeon: Harl Bowie, MD;  Location: [place];  Service: General;  Laterality: Left;  Laparoscopic left inguinal hernia repair and mesh  . left tendon repair  lft foot  . ROTATOR CUFF REPAIR     lf  . TEE WITHOUT CARDIOVERSION N/A 08/14/2017   Procedure: TRANSESOPHAGEAL ECHOCARDIOGRAM (TEE);  Surgeon: Josue Hector, MD;  Location: [facility] ENDOSCOPY;  Service: Cardiovascular;  Laterality: N/A;  . TONSILLECTOMY    . TOTAL KNEE ARTHROPLASTY Bilateral 02/06/2014   Procedure: TOTAL KNEE BILATERAL;  Surgeon: Mauri Pole, MD;  Location: [facility] ORS;  Service: Orthopedics;  Laterality: Bilateral;  . WRIST GANGLION EXCISION     lft    Current Outpatient Medications  Medication  Sig Dispense Refill  . ALPRAZolam (XANAX) 0.5 MG tablet TAKE 1 TABLET BY MOUTH TWICE DAILY AS NEEDED FOR ANXIETY( SEPARATE BY 8 HOURS FROM TEMAZEPAM) 5 tablet 0  . amiodarone (PACERONE) 200 MG tablet TAKE 1 TABLET(200 MG) BY MOUTH TWICE DAILY 60 tablet 0  . atorvastatin (LIPITOR) 40 MG tablet Take 1 tablet (40 mg total) by mouth once a week. (Patient taking differently: Take 40 mg by mouth every Sunday. ) 14 tablet 3  . carvedilol (COREG) 12.5 MG tablet Take 1 tablet (12.5 mg total) by mouth 2 (two) times daily with a meal. 60 tablet 3  . finasteride (PROPECIA) 1 MG tablet TAKE 1 TABLET(1 MG) BY MOUTH DAILY (Patient taking differently: Take 1 mg by mouth once a day) 30 tablet 5  . furosemide (LASIX) 40 MG tablet Take 1 tablet (40 mg total) by mouth daily. 30 tablet 3  . hydroxypropyl methylcellulose / hypromellose (ISOPTO TEARS / GONIOVISC) 2.5 % ophthalmic solution Place 2 drops into both eyes 3 (three) times daily as needed for dry eyes.    . meloxicam (MOBIC) 15 MG tablet TAKE 1 TABLET BY MOUTH DAILY (Patient taking differently: Take 15 mg by mouth once a day) 90 tablet 0  . omeprazole (PRILOSEC) 20 MG capsule TAKE 1 CAPSULE BY MOUTH DAILY (Patient taking differently: Take 20 mg by mouth once a day) 90 capsule 0  . potassium chloride SA (K-DUR,KLOR-CON) 20 MEQ tablet Take 2 tablets (40 mEq total) by mouth daily. 60 tablet 2  . Rivaroxaban (XARELTO) 15 MG TABS tablet Take 1 tablet (15 mg total) by mouth daily with supper. 60 tablet 2  . temazepam (RESTORIL) 15 MG capsule TAKE ONE CAPSULE BY MOUTH AT BEDTIME AS NEEDED (Patient taking differently: Take 15 mg by mouth at bedtime as needed for sleep) 30 capsule 2   No current facility-administered medications for this encounter.     No Known Allergies  Social History   Socioeconomic History  . Marital status: Single    Spouse name: Not on file  . Number of children: 0  . Years of education: Not on file  . Highest education level: Not on file    Occupational History  . Occupation: Retired  Scientific laboratory technician  . Financial resource strain: Not on file  . Food insecurity:    Worry: Not on file    Inability: Not on file  . Transportation needs:    Medical: Not on file    Non-medical: Not on file  Tobacco Use  . Smoking status: Former Smoker    Packs/day: 1.50    Years: 25.00    Pack years: 37.50    Last attempt to quit: 05/26/1986    Years since quitting: 31.3  . Smokeless tobacco: Never Used  Substance and Sexual Activity  . Alcohol use: Yes    Alcohol/week: 0.5 oz    Types: 1 Standard drinks or equivalent per week    Comment: stopped drinking   .  Drug use: No  . Sexual activity: Not Currently  Lifestyle  . Physical activity:    Days per week: Not on file    Minutes per session: Not on file  . Stress: Not on file  Relationships  . Social connections:    Talks on phone: Not on file    Gets together: Not on file    Attends religious service: Not on file    Active member of club or organization: Not on file    Attends meetings of clubs or organizations: Not on file    Relationship status: Not on file  . Intimate partner violence:    Fear of current or ex partner: Not on file    Emotionally abused: Not on file    Physically abused: Not on file    Forced sexual activity: Not on file  Other Topics Concern  . Not on file  Social History Narrative   Homosexual. Lives alone. Not sexually active.       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 Stroke Father 73       smoker  . Lung cancer Mother 54       former smoker  . Stroke Mother   . Stroke Brother   . Colon cancer Neg Hx     ROS- All systems are reviewed and negative except as per the HPI above  Physical Exam: Vitals:   09/30/17 1132  BP: 118/72  Pulse: (!) 58  SpO2: 91%  Weight: 190 lb (86.2 kg)  Height: 5'  8" (1.727 m)   Wt Readings from Last 3 Encounters:  09/30/17 190 lb (86.2 kg)  09/29/17 191 lb 9.6 oz (86.9 kg)  09/21/17 180 lb 4.8 oz (81.8 kg)    Labs: Lab Results  Component Value Date   NA 136 09/29/2017   K 5.2 09/29/2017   CL 99 09/29/2017   CO2 19 (L) 09/29/2017   GLUCOSE 126 (H) 09/29/2017   BUN 31 (H) 09/29/2017   CREATININE 1.47 (H) 09/29/2017   CALCIUM 8.9 09/29/2017   MG 1.7 09/17/2017   Lab Results  Component Value Date   INR 2.05 09/17/2017   Lab Results  Component Value Date   CHOL 133 08/14/2017   HDL 57 08/14/2017   LDLCALC 65 08/14/2017   TRIG 54 08/14/2017     GEN- The patient is well appearing, alert and oriented x 3 today.   Head- normocephalic, atraumatic Eyes-  Sclera clear, conjunctiva pink Ears- hearing intact Oropharynx- clear Neck- supple, no JVP Lymph- no cervical lymphadenopathy Lungs- Clear to ausculation bilaterally, normal work of breathing Heart- Regular rate and rhythm, no murmurs, rubs or gallops, PMI not laterally displaced GI- abdomen distended , NT, ND, + BS Extremities- no clubbing, cyanosis, 2+ pitting edema MS- no significant deformity or atrophy Skin- no rash or lesion Psych- euthymic mood, full affect Neuro- strength and sensation are intact  EKG- SInus brady at 58 bpm, PR int 190 ms, qrs int 142 ms, qtc 449 ms Epic records reviewed Echo-Study Conclusions  - Left ventricle: The cavity size was normal. Wall thickness was   increased in a pattern of moderate LVH. Systolic function was   moderately to severely reduced. The estimated ejection fraction   was in the range of 30% to 35%. - Aortic valve: AV is thickened,  calcified with restricted motion.   Peak and mean gradients through the valve are 16 and 11 mm Hg   respectively. Dimensionless index is 0.48. 2 D imaging suggests   moderate AS. There was mild regurgitation. - Left atrium: The atrium was severely dilated, 51 mm with LA volume of 143 ml - Right  ventricle: The cavity size was mildly dilated. Systolic   function was mildly to moderately reduced. - Pulmonary arteries: PA peak pressure: 46 mm Hg (S).     Assessment and Plan: 1. Afib with RVR Failed tikosyn Now on amiodarone at 200 mg bid In SR Continue carvedilol at 12.5 mg bid We discussed that amio at 200 mg bid may be contributing to shortness of breath but pt does not want to risk reducing the dose to 1x a day, if there is a chance he could go back into aib  2.  Acute on chronic HF/LV dysfunction, EF 30-35% Probably TMC Weight is up around 9-10 lbs Continue bid lasix 40 mg  He will return on Friday for weight and bmet, will decide then if he can continue bid dose He was told to weigh daily   To ER if breathing significantly worsens  Ablation pending 5/12, cardiac CT pending tomorrow  Butch Penny C. Taiyo Kozma, [facility] [address] [place], [place] [address] [phone]

## 2017-10-01 ENCOUNTER — Ambulatory Visit (HOSPITAL_COMMUNITY): Payer: Medicare Other

## 2017-10-01 ENCOUNTER — Encounter (HOSPITAL_COMMUNITY): Payer: Self-pay

## 2017-10-01 ENCOUNTER — Ambulatory Visit (HOSPITAL_COMMUNITY)
Admission: RE | Admit: 2017-10-01 | Discharge: 2017-10-01 | Disposition: A | Discharge status: Home / Self Care | Payer: Medicare Other | Source: Ambulatory Visit | Attending: Internal Medicine | Admitting: Internal Medicine

## 2017-10-01 DIAGNOSIS — Z96653 Presence of artificial knee joint, bilateral: Secondary | ICD-10-CM | POA: Diagnosis not present

## 2017-10-01 DIAGNOSIS — I480 Paroxysmal atrial fibrillation: Secondary | ICD-10-CM | POA: Diagnosis not present

## 2017-10-01 DIAGNOSIS — I481 Persistent atrial fibrillation: Secondary | ICD-10-CM | POA: Diagnosis not present

## 2017-10-01 DIAGNOSIS — J810 Acute pulmonary edema: Secondary | ICD-10-CM | POA: Diagnosis not present

## 2017-10-01 DIAGNOSIS — I4819 Other persistent atrial fibrillation: Secondary | ICD-10-CM

## 2017-10-01 DIAGNOSIS — I4891 Unspecified atrial fibrillation: Secondary | ICD-10-CM | POA: Diagnosis not present

## 2017-10-01 DIAGNOSIS — Z87891 Personal history of nicotine dependence: Secondary | ICD-10-CM | POA: Diagnosis not present

## 2017-10-01 DIAGNOSIS — Z951 Presence of aortocoronary bypass graft: Secondary | ICD-10-CM | POA: Diagnosis not present

## 2017-10-01 MED ORDER — IOPAMIDOL (ISOVUE-370) INJECTION 76%
INTRAVENOUS | Status: AC
  Filled 2017-10-01: qty 100

## 2017-10-01 MED ORDER — IOPAMIDOL (ISOVUE-370) INJECTION 76%: 100.0000 mL | Freq: Once | INTRAVENOUS | Status: DC | PRN

## 2017-10-01 MED ORDER — IOPAMIDOL (ISOVUE-370) INJECTION 76%
80.0000 mL | Freq: Once | INTRAVENOUS | Status: AC | PRN
  Administered 2017-10-01: 80 mL via INTRAVENOUS

## 2017-10-02 ENCOUNTER — Encounter (HOSPITAL_COMMUNITY): Payer: Self-pay | Admitting: Nurse Practitioner

## 2017-10-02 ENCOUNTER — Ambulatory Visit (HOSPITAL_COMMUNITY)
Admission: RE | Admit: 2017-10-02 | Discharge: 2017-10-02 | Disposition: A | Discharge status: Home / Self Care | Payer: Medicare Other | Source: Ambulatory Visit | Attending: Nurse Practitioner | Admitting: Nurse Practitioner

## 2017-10-02 DIAGNOSIS — I481 Persistent atrial fibrillation: Secondary | ICD-10-CM | POA: Insufficient documentation

## 2017-10-02 LAB — BASIC METABOLIC PANEL
ANION GAP: 8 (ref 5–15)
BUN: 31 mg/dL — ABNORMAL HIGH (ref 6–20)
CALCIUM: 8.7 mg/dL — AB (ref 8.9–10.3)
CO2: 24 mmol/L (ref 22–32)
Chloride: 101 mmol/L (ref 101–111)
Creatinine, Ser: 1.77 mg/dL — ABNORMAL HIGH (ref 0.61–1.24)
GFR, EST AFRICAN AMERICAN: 41 mL/min — AB (ref >60)
GFR, EST NON AFRICAN AMERICAN: 35 mL/min — AB (ref >60)
GLUCOSE: 122 mg/dL — AB (ref 65–99)
POTASSIUM: 4.9 mmol/L (ref 3.5–5.1)
Sodium: 133 mmol/L — ABNORMAL LOW (ref 135–145)

## 2017-10-03 ENCOUNTER — Other Ambulatory Visit: Payer: Self-pay | Admitting: Family Medicine

## 2017-10-05 ENCOUNTER — Ambulatory Visit: Payer: Medicare Other | Admitting: Internal Medicine

## 2017-10-05 ENCOUNTER — Telehealth: Payer: Self-pay | Admitting: Internal Medicine

## 2017-10-05 NOTE — Telephone Encounter (Signed)
Pt c/o swelling: STAT is pt has developed SOB within 24 hours  How much weight have you gained and in what time span?  Pt verbalized that he has gained 2 or 3 pounds  1) If swelling, where is the swelling located? Legs   2) Are you currently taking a fluid pill?  yes  3) Are you currently SOB? no  4) Do you have a log of your daily weights (if so, list)? NO,  Pt verbalized that he has gained 2 or 3 pounds   5) Have you gained 3 pounds in a day or 5 pounds in a week?  Pt verbalized that he has gained 2 or 3 pounds   6) Have you traveled recently? no

## 2017-10-05 NOTE — Telephone Encounter (Signed)
Call returned to Pt. Per Pt he was calling to ensure [name] knew he had some LE edema before ablation. Pt states he feels good, no SOB.  Per Pt when he was admitted several weeks ago he had 3 pillow orthopnea, now he can sleep laying flat. Was concerned [name] would see his edema and change his mind about the ablation.  Advised Pt I would alert [name]. Will call Pt back.

## 2017-10-05 NOTE — Telephone Encounter (Signed)
Discussed with [name].  Advised Pt should not make any changes.  Will see tomorrow for afib ablation.  Pt notified.

## 2017-10-06 ENCOUNTER — Encounter (HOSPITAL_COMMUNITY): Payer: Self-pay | Admitting: Anesthesiology

## 2017-10-06 ENCOUNTER — Ambulatory Visit (HOSPITAL_COMMUNITY)
Admission: RE | Admit: 2017-10-06 | Discharge: 2017-10-07 | Disposition: A | Discharge status: Home / Self Care | Payer: Medicare Other | Source: Ambulatory Visit | Attending: Internal Medicine | Admitting: Internal Medicine

## 2017-10-06 ENCOUNTER — Ambulatory Visit (HOSPITAL_COMMUNITY): Payer: Medicare Other | Admitting: Certified Registered Nurse Anesthetist

## 2017-10-06 ENCOUNTER — Encounter (HOSPITAL_COMMUNITY)
Admission: RE | Disposition: A | Discharge status: Home / Self Care | Payer: Self-pay | Source: Ambulatory Visit | Attending: Internal Medicine

## 2017-10-06 DIAGNOSIS — K219 Gastro-esophageal reflux disease without esophagitis: Secondary | ICD-10-CM | POA: Diagnosis not present

## 2017-10-06 DIAGNOSIS — I110 Hypertensive heart disease with heart failure: Secondary | ICD-10-CM | POA: Insufficient documentation

## 2017-10-06 DIAGNOSIS — Z87891 Personal history of nicotine dependence: Secondary | ICD-10-CM | POA: Insufficient documentation

## 2017-10-06 DIAGNOSIS — Z79899 Other long term (current) drug therapy: Secondary | ICD-10-CM | POA: Insufficient documentation

## 2017-10-06 DIAGNOSIS — I480 Paroxysmal atrial fibrillation: Secondary | ICD-10-CM | POA: Diagnosis not present

## 2017-10-06 DIAGNOSIS — I481 Persistent atrial fibrillation: Secondary | ICD-10-CM | POA: Insufficient documentation

## 2017-10-06 DIAGNOSIS — Z791 Long term (current) use of non-steroidal anti-inflammatories (NSAID): Secondary | ICD-10-CM | POA: Insufficient documentation

## 2017-10-06 DIAGNOSIS — Z96653 Presence of artificial knee joint, bilateral: Secondary | ICD-10-CM | POA: Insufficient documentation

## 2017-10-06 DIAGNOSIS — F419 Anxiety disorder, unspecified: Secondary | ICD-10-CM | POA: Diagnosis not present

## 2017-10-06 DIAGNOSIS — I4891 Unspecified atrial fibrillation: Secondary | ICD-10-CM | POA: Diagnosis not present

## 2017-10-06 DIAGNOSIS — Z7901 Long term (current) use of anticoagulants: Secondary | ICD-10-CM | POA: Insufficient documentation

## 2017-10-06 DIAGNOSIS — I4819 Other persistent atrial fibrillation: Secondary | ICD-10-CM | POA: Diagnosis present

## 2017-10-06 DIAGNOSIS — I447 Left bundle-branch block, unspecified: Secondary | ICD-10-CM | POA: Insufficient documentation

## 2017-10-06 DIAGNOSIS — I509 Heart failure, unspecified: Secondary | ICD-10-CM | POA: Diagnosis not present

## 2017-10-06 HISTORY — PX: ATRIAL FIBRILLATION ABLATION: EP1191

## 2017-10-06 LAB — POCT ACTIVATED CLOTTING TIME
ACTIVATED CLOTTING TIME: 324 s
ACTIVATED CLOTTING TIME: 345 s
ACTIVATED CLOTTING TIME: 230 s
ACTIVATED CLOTTING TIME: 208 s
Activated Clotting Time: 368 seconds
Activated Clotting Time: 191 seconds

## 2017-10-06 LAB — BASIC METABOLIC PANEL WITH GFR
Anion gap: 13 (ref 5–15)
BUN: 26 mg/dL — ABNORMAL HIGH (ref 6–20)
CO2: 21 mmol/L — ABNORMAL LOW (ref 22–32)
Calcium: 8.7 mg/dL — ABNORMAL LOW (ref 8.9–10.3)
Chloride: 101 mmol/L (ref 101–111)
Creatinine, Ser: 1.67 mg/dL — ABNORMAL HIGH (ref 0.61–1.24)
GFR calc Af Amer: 44 mL/min — ABNORMAL LOW (ref >60)
GFR calc non Af Amer: 38 mL/min — ABNORMAL LOW (ref >60)
Glucose, Bld: 116 mg/dL — ABNORMAL HIGH (ref 65–99)
Potassium: 4.4 mmol/L (ref 3.5–5.1)
Sodium: 135 mmol/L (ref 135–145)

## 2017-10-06 SURGERY — ATRIAL FIBRILLATION ABLATION
Anesthesia: General

## 2017-10-06 MED ORDER — ONDANSETRON HCL 4 MG/2ML IJ SOLN
INTRAMUSCULAR | Status: DC | PRN
  Administered 2017-10-06: 4 mg via INTRAVENOUS

## 2017-10-06 MED ORDER — LIDOCAINE 2% (20 MG/ML) 5 ML SYRINGE
INTRAMUSCULAR | Status: DC | PRN
  Administered 2017-10-06: 100 mg via INTRAVENOUS

## 2017-10-06 MED ORDER — HEPARIN SODIUM (PORCINE) 1000 UNIT/ML IJ SOLN
INTRAMUSCULAR | Status: AC
  Filled 2017-10-06: qty 1

## 2017-10-06 MED ORDER — SODIUM CHLORIDE 0.9% FLUSH: 3.0000 mL | INTRAVENOUS | Status: DC | PRN

## 2017-10-06 MED ORDER — DEXAMETHASONE SODIUM PHOSPHATE 10 MG/ML IJ SOLN
INTRAMUSCULAR | Status: DC | PRN
  Administered 2017-10-06: 10 mg via INTRAVENOUS

## 2017-10-06 MED ORDER — FUROSEMIDE 10 MG/ML IJ SOLN
60.0000 mg | Freq: Two times a day (BID) | INTRAMUSCULAR | Status: DC
  Administered 2017-10-06: 60 mg via INTRAVENOUS
  Filled 2017-10-06: qty 6

## 2017-10-06 MED ORDER — ROCURONIUM BROMIDE 10 MG/ML (PF) SYRINGE
PREFILLED_SYRINGE | INTRAVENOUS | Status: DC | PRN
  Administered 2017-10-06: 50 mg via INTRAVENOUS

## 2017-10-06 MED ORDER — PROTAMINE SULFATE 10 MG/ML IV SOLN
INTRAVENOUS | Status: DC | PRN
  Administered 2017-10-06: 30 mg via INTRAVENOUS

## 2017-10-06 MED ORDER — FINASTERIDE 1 MG PO TABS: 1.0000 mg | ORAL_TABLET | Freq: Every day | ORAL | Status: DC

## 2017-10-06 MED ORDER — HEPARIN (PORCINE) IN NACL 1000-0.9 UT/500ML-% IV SOLN
INTRAVENOUS | Status: AC
  Filled 2017-10-06: qty 500

## 2017-10-06 MED ORDER — AMIODARONE HCL 200 MG PO TABS
200.0000 mg | ORAL_TABLET | Freq: Every day | ORAL | Status: DC
  Administered 2017-10-07: 200 mg via ORAL
  Filled 2017-10-06 (×2): qty 1

## 2017-10-06 MED ORDER — RIVAROXABAN 15 MG PO TABS
15.0000 mg | ORAL_TABLET | Freq: Every day | ORAL | Status: DC
  Administered 2017-10-06: 15 mg via ORAL
  Filled 2017-10-06: qty 1

## 2017-10-06 MED ORDER — POTASSIUM CHLORIDE CRYS ER 20 MEQ PO TBCR
40.0000 meq | EXTENDED_RELEASE_TABLET | Freq: Every day | ORAL | Status: DC
  Administered 2017-10-06 – 2017-10-07 (×2): 40 meq via ORAL
  Filled 2017-10-06 (×2): qty 2

## 2017-10-06 MED ORDER — HEPARIN (PORCINE) IN NACL 2-0.9 UNITS/ML
INTRAMUSCULAR | Status: AC | PRN
  Administered 2017-10-06: 500 mL

## 2017-10-06 MED ORDER — SODIUM CHLORIDE 0.9% FLUSH: 3.0000 mL | Freq: Two times a day (BID) | INTRAVENOUS | Status: DC

## 2017-10-06 MED ORDER — TEMAZEPAM 15 MG PO CAPS
15.0000 mg | ORAL_CAPSULE | Freq: Every evening | ORAL | Status: DC | PRN
  Administered 2017-10-06: 15 mg via ORAL
  Filled 2017-10-06: qty 1

## 2017-10-06 MED ORDER — IOPAMIDOL (ISOVUE-370) INJECTION 76%
INTRAVENOUS | Status: DC | PRN
  Administered 2017-10-06: 3 mL via INTRAVENOUS

## 2017-10-06 MED ORDER — SODIUM CHLORIDE 0.9 % IV SOLN
INTRAVENOUS | Status: DC
  Administered 2017-10-06 (×2): via INTRAVENOUS

## 2017-10-06 MED ORDER — SUGAMMADEX SODIUM 200 MG/2ML IV SOLN
INTRAVENOUS | Status: DC | PRN
  Administered 2017-10-06: 200 mg via INTRAVENOUS

## 2017-10-06 MED ORDER — ACETAMINOPHEN 325 MG PO TABS: 650.0000 mg | ORAL_TABLET | ORAL | Status: DC | PRN

## 2017-10-06 MED ORDER — CARVEDILOL 12.5 MG PO TABS
12.5000 mg | ORAL_TABLET | Freq: Two times a day (BID) | ORAL | Status: DC
  Administered 2017-10-07: 12.5 mg via ORAL
  Filled 2017-10-06 (×2): qty 1

## 2017-10-06 MED ORDER — ONDANSETRON HCL 4 MG/2ML IJ SOLN: 4.0000 mg | Freq: Four times a day (QID) | INTRAMUSCULAR | Status: DC | PRN

## 2017-10-06 MED ORDER — BUPIVACAINE HCL (PF) 0.25 % IJ SOLN
INTRAMUSCULAR | Status: AC
  Filled 2017-10-06: qty 30

## 2017-10-06 MED ORDER — PHENYLEPHRINE HCL 10 MG/ML IJ SOLN
INTRAMUSCULAR | Status: DC | PRN
  Administered 2017-10-06: 80 ug via INTRAVENOUS
  Administered 2017-10-06: 40 ug via INTRAVENOUS
  Administered 2017-10-06 (×2): 80 ug via INTRAVENOUS

## 2017-10-06 MED ORDER — FENTANYL CITRATE (PF) 250 MCG/5ML IJ SOLN
INTRAMUSCULAR | Status: DC | PRN
  Administered 2017-10-06: 50 ug via INTRAVENOUS

## 2017-10-06 MED ORDER — PHENYLEPHRINE HCL 10 MG/ML IJ SOLN
INTRAVENOUS | Status: DC | PRN
  Administered 2017-10-06: 25 ug/min via INTRAVENOUS

## 2017-10-06 MED ORDER — PROPOFOL 10 MG/ML IV BOLUS
INTRAVENOUS | Status: DC | PRN
Start: 2017-10-06 — End: 2017-10-06
  Administered 2017-10-06: 200 mg via INTRAVENOUS

## 2017-10-06 MED ORDER — MIDAZOLAM HCL 2 MG/2ML IJ SOLN
INTRAMUSCULAR | Status: DC | PRN
  Administered 2017-10-06: 1 mg via INTRAVENOUS

## 2017-10-06 MED ORDER — HEPARIN SODIUM (PORCINE) 1000 UNIT/ML IJ SOLN
INTRAMUSCULAR | Status: DC | PRN
  Administered 2017-10-06 (×2): 1000 [IU] via INTRAVENOUS
  Administered 2017-10-06: 12000 [IU] via INTRAVENOUS

## 2017-10-06 MED ORDER — BUPIVACAINE HCL (PF) 0.25 % IJ SOLN
INTRAMUSCULAR | Status: DC | PRN
  Administered 2017-10-06: 30 mL

## 2017-10-06 MED ORDER — EPHEDRINE SULFATE 50 MG/ML IJ SOLN
INTRAMUSCULAR | Status: DC | PRN
  Administered 2017-10-06: 5 mg via INTRAVENOUS

## 2017-10-06 MED ORDER — HYDROCODONE-ACETAMINOPHEN 5-325 MG PO TABS: 1.0000 | ORAL_TABLET | ORAL | Status: DC | PRN

## 2017-10-06 MED ORDER — IOPAMIDOL (ISOVUE-370) INJECTION 76%
INTRAVENOUS | Status: AC
  Filled 2017-10-06: qty 50

## 2017-10-06 MED ORDER — SODIUM CHLORIDE 0.9 % IV SOLN: 250.0000 mL | INTRAVENOUS | Status: DC | PRN

## 2017-10-06 MED ORDER — HEPARIN SODIUM (PORCINE) 1000 UNIT/ML IJ SOLN
INTRAMUSCULAR | Status: DC | PRN
  Administered 2017-10-06: 3000 [IU] via INTRAVENOUS
  Administered 2017-10-06: 2000 [IU] via INTRAVENOUS

## 2017-10-06 MED ORDER — ALPRAZOLAM 0.5 MG PO TABS: 0.5000 mg | ORAL_TABLET | Freq: Two times a day (BID) | ORAL | Status: DC | PRN

## 2017-10-06 SURGICAL SUPPLY — 20 items
BLANKET WARM UNDERBOD FULL ACC (MISCELLANEOUS) ×3 IMPLANT
CATH MAPPNG PENTARAY F 2-6-2MM (CATHETERS) IMPLANT
CATH NAVISTAR SMARTTOUCH DF (ABLATOR) ×2 IMPLANT
CATH SOUNDSTAR 3D IMAGING (CATHETERS) ×2 IMPLANT
CATH WEBSTER BI DIR CS D-F CRV (CATHETERS) ×2 IMPLANT
COVER SWIFTLINK CONNECTOR (BAG) ×3 IMPLANT
NDL BAYLIS TRANSSEPTAL 71CM (NEEDLE) IMPLANT
NDL TRANSSPETAL BRK-XS 71CM (NEEDLE) IMPLANT
NEEDLE BAYLIS TRANSSEPTAL 71CM (NEEDLE) ×3 IMPLANT
NEEDLE TRANSSPETAL BRK-XS 71CM (NEEDLE) ×3
PACK EP LATEX FREE (CUSTOM PROCEDURE TRAY) ×3
PACK EP LF (CUSTOM PROCEDURE TRAY) ×1 IMPLANT
PAD DEFIB LIFELINK (PAD) ×3 IMPLANT
PATCH CARTO3 (PAD) ×2 IMPLANT
PENTARAY F 2-6-2MM (CATHETERS) ×3
SHEATH AVANTI 11F 11CM (SHEATH) ×2 IMPLANT
SHEATH PINNACLE 7F 10CM (SHEATH) ×4 IMPLANT
SHEATH PINNACLE 9F 10CM (SHEATH) ×2 IMPLANT
SHEATH SWARTZ TS SL2 63CM 8.5F (SHEATH) ×2 IMPLANT
TUBING SMART ABLATE COOLFLOW (TUBING) ×2 IMPLANT

## 2017-10-06 NOTE — Transfer of Care (Signed)
Immediate Anesthesia Transfer of Care Note  Patient: Travis [name]  Procedure(s) Performed: ATRIAL FIBRILLATION ABLATION (N/A )  Patient Location: Cath Lab  Anesthesia Type:General  Level of Consciousness: awake, alert  and oriented  Airway & Oxygen Therapy: Patient Spontanous Breathing and Patient connected to nasal cannula oxygen  Post-op Assessment: Report given to RN and Post -op Vital signs reviewed and stable  Post vital signs: Reviewed and stable  Last Vitals:  Vitals Value Taken Time  BP 114/68 10/06/2017  2:20 PM  Temp 36.3 C 10/06/2017  2:20 PM  Pulse 57 10/06/2017  2:20 PM  Resp 16 10/06/2017  2:20 PM  SpO2 92 % 10/06/2017  2:20 PM    Last Pain:  Vitals:   10/06/17 1420  TempSrc: Temporal  PainSc: 0-No pain         Complications: No apparent anesthesia complications

## 2017-10-06 NOTE — Interval H&P Note (Signed)
History and Physical Interval Note:  10/06/2017 10:55 AM  Travis [name]  has presented today for surgery, with the diagnosis of Afib  The various methods of treatment have been discussed with the patient and family. After consideration of risks, benefits and other options for treatment, the patient has consented to  Procedure(s): ATRIAL FIBRILLATION ABLATION (N/A) as a surgical intervention .  The patient's history has been reviewed, patient examined, no change in status, stable for surgery.  I have reviewed the patient's chart and labs.  Questions were answered to the patient's satisfaction.    Cardiac CT reviewed with the patient.  On exam, lungs are clear, though he does have peripheral edema.  Reports compliance with xarelto without interruption.  Clinically stable to proceed with ablation.  [name]

## 2017-10-06 NOTE — Discharge Summary (Addendum)
ELECTROPHYSIOLOGY PROCEDURE DISCHARGE SUMMARY    Patient ID: Travis [name],  MRN: [id number], DOB/AGE: October 29, 1940 77 y.o.  Admit date: 10/06/2017 Discharge date: 10/07/2017  Primary Care Physician: [name], MD Electrophysiologist: Caryl Comes  Primary Discharge Diagnosis:  Persistent atrial fibrillation status post ablation this admission  Secondary Discharge Diagnosis:  1.  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  LBBB 3.  HTN 4.  Sinus bradycardia 5.  GERD  Procedures This Admission:  1.  Electrophysiology study and radiofrequency catheter ablation on 10/06/17 by Dr [name].  This study demonstrated sinus rhythm with LBBB upon presentation; intracardiac echo reveals severe right atrial enlargement, moderate left atrial enlargement, and a large common ostium to the left PVs; successful electrical isolation and anatomical encircling of all four pulmonary veins with radiofrequency current.  A WACA approach was used; additional left atrial ablation was performed with a standard box lesion created along the posterior wall of the left atrium; no early apparent complications..    Brief HPI: Travis [name] is a 77 y.o. male with a history of persistent atrial fibrillation.  They have failed medical therapy with amiodarone. Risks, benefits, and alternatives to catheter ablation of atrial fibrillation were reviewed with the patient who wished to proceed.  The patient underwent cardiac CT prior to the procedure which demonstrated no LAA thrombus and bilateral pleural effusions.    Hospital Course:  The patient was admitted and underwent EPS/RFCA of atrial fibrillation with details as outlined above.  They were monitored on telemetry overnight which demonstrated SR.  Groin was without complication on the day of discharge.  The patient was examined and considered to be stable for discharge.  Wound care and restrictions were reviewed with the patient.  The patient will be seen back  by Roderic Palau, NP in 4 weeks and Dr Rayann Heman in 12 weeks for post ablation follow up.   This patients CHA2DS2-VASc Score and unadjusted Ischemic Stroke Rate (% per year) is equal to 4.8 % stroke rate/year from a score of 4 Above score calculated as 1 point each if present [CHF, HTN, DM, Vascular=MI/PAD/Aortic Plaque, Age if 65-74, or Male] Above score calculated as 2 points each if present [Age > 75, or Stroke/TIA/TE]  Cardiomyopathy is presumed tachycardia mediated. Plan to update echo in 3 months.  If EF remains depressed, consider CRTD.  Physical Exam: Vitals:   10/06/17 1921 10/06/17 1949 10/06/17 1951 10/07/17 0640  BP: (!) 165/99  (!) 159/87 (!) 154/85  Pulse:   79 75  Resp:    18  Temp:   97.7 F (36.5 C) 97.6 F (36.4 C)  TempSrc:   Axillary Oral  SpO2:  (!) 89% 93% 93%  Weight:    187 lb 8 oz (85 kg)  Height:        GEN- The patient is well appearing, alert and oriented x 3 today.   HEENT: normocephalic, atraumatic; sclera clear, conjunctiva pink; hearing intact; oropharynx clear; neck supple  Lungs- Clear to ausculation bilaterally, normal work of breathing.  No wheezes, rales, rhonchi Heart- Regular rate and rhythm GI- soft, non-tender, non-distended, bowel sounds present  Extremities- no clubbing, cyanosis, or edema; DP/PT/radial pulses 2+ bilaterally, groin without hematoma/bruit MS- no significant deformity or atrophy Skin- warm and dry, no rash or lesion Psych- euthymic mood, full affect Neuro- strength and sensation are intact   Labs:   Lab Results  Component Value Date   WBC 10.0 09/29/2017   HGB 13.4 09/29/2017   HCT 40.4  09/29/2017   MCV 99 (H) 09/29/2017   PLT 196 09/29/2017    Recent Labs  Lab 10/07/17 0401  NA 135  K 4.4  CL 102  CO2 21*  BUN 23*  CREATININE 1.45*  CALCIUM 8.3*  GLUCOSE 116*     Discharge Medications:  Allergies as of 10/07/2017   No Known Allergies     Medication List    TAKE these medications   ALPRAZolam  0.5 MG tablet Commonly known as:  XANAX TAKE 1 TABLET BY MOUTH TWICE DAILY AS NEEDED FOR ANXIETY( SEPARATE BY 8 HOURS FROM TEMAZEPAM)   amiodarone 200 MG tablet Commonly known as:  PACERONE Take 1 tablet (200 mg total) by mouth daily. What changed:  See the new instructions.   atorvastatin 40 MG tablet Commonly known as:  LIPITOR Take 1 tablet (40 mg total) by mouth once a week. What changed:  when to take this   carvedilol 12.5 MG tablet Commonly known as:  COREG Take 1 tablet (12.5 mg total) by mouth 2 (two) times daily with a meal.   finasteride 1 MG tablet Commonly known as:  PROPECIA TAKE 1 TABLET(1 MG) BY MOUTH DAILY   furosemide 40 MG tablet Commonly known as:  LASIX Take 1 tablet (40 mg total) by mouth daily.   hydroxypropyl methylcellulose / hypromellose 2.5 % ophthalmic solution Commonly known as:  ISOPTO TEARS / GONIOVISC Place 2 drops into both eyes 3 (three) times daily as needed for dry eyes.   meloxicam 15 MG tablet Commonly known as:  MOBIC TAKE 1 TABLET BY MOUTH DAILY What changed:    how much to take  how to take this  when to take this   omeprazole 20 MG capsule Commonly known as:  PRILOSEC TAKE 1 CAPSULE BY MOUTH DAILY What changed:    how much to take  how to take this  when to take this   potassium chloride SA 20 MEQ tablet Commonly known as:  K-DUR,KLOR-CON Take 2 tablets (40 mEq total) by mouth daily.   Rivaroxaban 15 MG Tabs tablet Commonly known as:  XARELTO Take 1 tablet (15 mg total) by mouth daily with supper.   temazepam 15 MG capsule Commonly known as:  RESTORIL TAKE ONE CAPSULE BY MOUTH AT BEDTIME AS NEEDED What changed:    how much to take  how to take this  when to take this       Disposition:  Discharge Instructions    Diet - low sodium heart healthy   Complete by:  As directed    Increase activity slowly   Complete by:  As directed      Follow-up Information    [facility]  Follow up on 11/09/2017.   Specialty:  Cardiology Why:  at 11:30AM Contact information: [phone] [place] [address] [phone]       [name], MD Follow up on 01/11/2018.   Specialty:  Cardiology Why:  at [facility] information: [place] [address] [phone]           Duration of Discharge Encounter: Greater than 30 minutes including physician time.  Signed, Chanetta Marshall, NP 10/07/2017 8:55 AM  I have seen, examined the patient, and reviewed the above assessment and plan.  Changes to above are made where necessary.  On exam, RRR.  Wean O2 to off if able.  Reduce amiodarone to 200mg  daily.  DC to home today.  Would  benefit from outpatient follow-up in CHF clinic.  Co Sign: [name], MD 10/07/2017

## 2017-10-06 NOTE — Progress Notes (Addendum)
Site area: RFV x 3 Site Prior to Removal:  Level  Pressure Applied For: 30 min Manual:   yes Patient Status During Pull:  stable Post Pull Site:  Level Post Pull Instructions Given:  Yes  Post Pull Pulses Present: palpable Dressing Applied:  clesr/gauze Bedrest begins @ 3646 till 2330 Comments:

## 2017-10-06 NOTE — Anesthesia Preprocedure Evaluation (Signed)
Anesthesia Evaluation  Patient identified by MRN, date of birth, ID band Patient awake    Reviewed: Allergy & Precautions, NPO status , Patient's Chart, lab work & pertinent test results  Airway Mallampati: II  TM Distance: >3 FB Neck ROM: Full    Dental  (+) Dental Advisory Given, Teeth Intact   Pulmonary pneumonia, former smoker,    breath sounds clear to auscultation       Cardiovascular hypertension, Pt. on medications and Pt. on home beta blockers +CHF  Normal cardiovascular exam+ dysrhythmias + Valvular Problems/Murmurs AS  Rhythm:Irregular Rate:Normal  08/13/17: Study Conclusions - Left ventricle: The cavity size was normal. Wall thickness was  increased in a pattern of moderate LVH. Systolic function was  moderately to severely reduced. The estimated ejection fraction  was in the range of 30% to 35%. - Aortic valve: AV is thickened, calcified with restricted motion.  Peak and mean gradients through the valve are 16 and 11 mm Hg  respectively. Dimensionless index is 0.48. 2 D imaging suggests  moderate AS. There was mild regurgitation. - Left atrium: The atrium was severely dilated. - Right ventricle: The cavity size was mildly dilated. Systolic  function was mildly to moderately reduced. - Pulmonary arteries: PA peak pressure: 46 mm Hg (S).   Neuro/Psych PSYCHIATRIC DISORDERS Anxiety negative neurological ROS     GI/Hepatic Neg liver ROS, GERD  Medicated,  Endo/Other  negative endocrine ROS  Renal/GU Renal InsufficiencyRenal disease  negative genitourinary   Musculoskeletal  (+) Arthritis ,   Abdominal Normal abdominal exam  (+)   Peds  Hematology   Anesthesia Other Findings   Reproductive/Obstetrics                             Lab Results  Component Value Date   WBC 10.0 09/29/2017   HGB 13.4 09/29/2017   HCT 40.4 09/29/2017   MCV 99 (H) 09/29/2017   PLT 196 09/29/2017   Lab  Results  Component Value Date   CREATININE 1.77 (H) 10/02/2017   BUN 31 (H) 10/02/2017   NA 133 (L) 10/02/2017   K 4.9 10/02/2017   CL 101 10/02/2017   CO2 24 10/02/2017    Anesthesia Physical  Anesthesia Plan  ASA: III  Anesthesia Plan: General   Post-op Pain Management:    Induction: Intravenous  PONV Risk Score and Plan: 2 and Treatment may vary due to age or medical condition  Airway Management Planned: Oral ETT  Additional Equipment:   Intra-op Plan:   Post-operative Plan:   Informed Consent: I have reviewed the patients History and Physical, chart, labs and discussed the procedure including the risks, benefits and alternatives for the proposed anesthesia with the patient or authorized representative who has indicated his/her understanding and acceptance.     Plan Discussed with: CRNA and Surgeon  Anesthesia Plan Comments:         Anesthesia Quick Evaluation

## 2017-10-06 NOTE — Anesthesia Postprocedure Evaluation (Signed)
Anesthesia Post Note  Patient: Travis [name]  Procedure(s) Performed: ATRIAL FIBRILLATION ABLATION (N/A )     Patient location during evaluation: Cath Lab Anesthesia Type: General Level of consciousness: awake Pain management: pain level controlled Vital Signs Assessment: post-procedure vital signs reviewed and stable Respiratory status: spontaneous breathing Cardiovascular status: stable Postop Assessment: adequate PO intake Anesthetic complications: no    Last Vitals:  Vitals:   10/06/17 1445 10/06/17 1446  BP:  131/73  Pulse: (!) 57 (!) 57  Resp: (!) 21 (!) 23  Temp:    SpO2: 92% 92%    Last Pain:  Vitals:   10/06/17 1420  TempSrc: Temporal  PainSc: 0-No pain   Pain Goal:                 Eriyah Fernando JR,Juliano Kardell Virgil

## 2017-10-06 NOTE — Anesthesia Procedure Notes (Signed)
Procedure Name: Intubation Date/Time: 10/06/2017 11:19 AM Performed by: Clearnce Sorrel, CRNA Pre-anesthesia Checklist: Patient identified, Emergency Drugs available, Suction available, Patient being monitored and Timeout performed Patient Re-evaluated:Patient Re-evaluated prior to induction Oxygen Delivery Method: Circle system utilized Preoxygenation: Pre-oxygenation with 100% oxygen Induction Type: IV induction Ventilation: Mask ventilation without difficulty Laryngoscope Size: Mac and 3 Grade View: Grade II Tube type: Oral Tube size: 7.5 mm Number of attempts: 1 Airway Equipment and Method: Stylet Placement Confirmation: ETT inserted through vocal cords under direct vision,  positive ETCO2 and breath sounds checked- equal and bilateral Secured at: 23 cm Tube secured with: Tape Dental Injury: Teeth and Oropharynx as per pre-operative assessment

## 2017-10-07 ENCOUNTER — Other Ambulatory Visit: Payer: Self-pay | Admitting: Family Medicine

## 2017-10-07 ENCOUNTER — Encounter (HOSPITAL_COMMUNITY): Payer: Self-pay | Admitting: Internal Medicine

## 2017-10-07 ENCOUNTER — Other Ambulatory Visit: Payer: Self-pay | Admitting: Nurse Practitioner

## 2017-10-07 DIAGNOSIS — I130 Hypertensive heart and chronic kidney disease with heart failure and stage 1 through stage 4 chronic kidney disease, or unspecified chronic kidney disease: Secondary | ICD-10-CM | POA: Diagnosis not present

## 2017-10-07 DIAGNOSIS — I509 Heart failure, unspecified: Secondary | ICD-10-CM | POA: Diagnosis not present

## 2017-10-07 DIAGNOSIS — I110 Hypertensive heart disease with heart failure: Secondary | ICD-10-CM | POA: Diagnosis not present

## 2017-10-07 DIAGNOSIS — I481 Persistent atrial fibrillation: Secondary | ICD-10-CM | POA: Diagnosis not present

## 2017-10-07 DIAGNOSIS — R609 Edema, unspecified: Secondary | ICD-10-CM | POA: Diagnosis not present

## 2017-10-07 DIAGNOSIS — Q231 Congenital insufficiency of aortic valve: Secondary | ICD-10-CM | POA: Diagnosis not present

## 2017-10-07 DIAGNOSIS — J811 Chronic pulmonary edema: Secondary | ICD-10-CM | POA: Diagnosis not present

## 2017-10-07 DIAGNOSIS — I447 Left bundle-branch block, unspecified: Secondary | ICD-10-CM | POA: Diagnosis not present

## 2017-10-07 DIAGNOSIS — N179 Acute kidney failure, unspecified: Secondary | ICD-10-CM | POA: Diagnosis not present

## 2017-10-07 DIAGNOSIS — I5023 Acute on chronic systolic (congestive) heart failure: Secondary | ICD-10-CM | POA: Diagnosis not present

## 2017-10-07 DIAGNOSIS — I480 Paroxysmal atrial fibrillation: Secondary | ICD-10-CM | POA: Diagnosis not present

## 2017-10-07 LAB — BASIC METABOLIC PANEL
Anion gap: 12 (ref 5–15)
BUN: 23 mg/dL — AB (ref 6–20)
CALCIUM: 8.3 mg/dL — AB (ref 8.9–10.3)
CHLORIDE: 102 mmol/L (ref 101–111)
CO2: 21 mmol/L — AB (ref 22–32)
CREATININE: 1.45 mg/dL — AB (ref 0.61–1.24)
GFR calc non Af Amer: 45 mL/min — ABNORMAL LOW (ref >60)
GFR, EST AFRICAN AMERICAN: 52 mL/min — AB (ref >60)
GLUCOSE: 116 mg/dL — AB (ref 65–99)
Potassium: 4.4 mmol/L (ref 3.5–5.1)
Sodium: 135 mmol/L (ref 135–145)

## 2017-10-07 MED ORDER — AMIODARONE HCL 200 MG PO TABS: 200.0000 mg | ORAL_TABLET | Freq: Every day | ORAL | 0 refills | Status: DC

## 2017-10-07 MED ORDER — PANTOPRAZOLE SODIUM 40 MG PO TBEC: 40.0000 mg | DELAYED_RELEASE_TABLET | Freq: Every day | ORAL | 0 refills | Status: DC

## 2017-10-07 NOTE — Plan of Care (Signed)

## 2017-10-07 NOTE — Progress Notes (Signed)
SATURATION QUALIFICATIONS: (This note is used to comply with regulatory documentation for home oxygen)  Patient Saturations on Room Air at Rest =88  Patient Saturations on Room Air while Ambulating = 83  Patient Saturations on 2 Liters of oxygen while Ambulating = 90  Please briefly explain why patient needs home oxygen: Pt desat to 83 while ambulating

## 2017-10-07 NOTE — Care Management Note (Addendum)
Case Management Note  Patient Details  Name: Travis [name] MRN: [id number] Date of Birth: [birth year]  Subjective/Objective: Pt presented for Persistent Atrial Fib S/p Ablation. PTA Independent from home alone. Pt still drives and is very active. Pt qualifies for home 02 and he has asked for a small tank.    Action/Plan: Pt asked CM to look @ [employer] to see if the company can supply him with a small tank. CM did make referral with Duw[facility] Maxin- orders placed in Epic for home 02. DME will be delivered to the room prior to transition home. No further needs from CM at this time.   Expected Discharge Date:  10/07/17               Expected Discharge Plan:  Home/Self Care  In-House Referral:  NA  Discharge planning Services  CM Consult  Post Acute Care Choice:  Durable Medical Equipment Choice offered to:  Patient  DME Arranged:  Oxygen DME Agency:  [facility]  HH Arranged:  NA HH Agency:  NA  Status of Service:  Completed, signed off  If discussed at [place] of Stay Meetings, dates discussed:    Additional Comments: 1505 10-07-17 Jacqlyn Krauss, RN,BSN [phone] CM did get 02 order signed. 02 in process and will be delivered shortly. No further needs from CM at this time.  Bethena Roys, RN 10/07/2017, 11:42 AM

## 2017-10-07 NOTE — Discharge Instructions (Signed)
No driving for 4 days. No lifting over 5 lbs for 1 week. No sexual activity for 1 week. You may return to work in 1 week. Keep procedure site clean & dry. If you notice increased pain, swelling, bleeding or pus, call/return!  You may shower, but no soaking baths/hot tubs/pools for 1 week.  ° °You have an appointment set up with the At[facility]ibrillation Clinic.  Multiple studies have shown that being followed by a dedicated atrial fibrillation clinic in addition to the standard care you receive from your other physicians improves health. We believe that enrollment in the atrial fibrillation clinic will allow us to better care for you.  ° °The phone number to the Atrial Fibrillation Clinic is [phone]. The clinic is staffed Monday through Friday from 8:30am to 5pm. ° °Parking Directions: The clinic is located in the Heart and Vascular Building connected to [facility]. °1)From [address] turn on to [address] and go to the 3rd entrance  (Heart and Vascular entrance) on the right. °2)Look to the right for Heart &Vascular Parking Garage. °3)A code for the entrance is required please call the clinic to receive this.   °4)Take the elevators to the 1st floor. Registration is in the room with the glass walls at the end of the hallway. ° °If you have any trouble parking or locating the clinic, please don’t hesitate to call [phone]. ° ° ° ° ° ° ° °Information on my medicine - ELIQUIS® (apixaban) ° °This medication education was reviewed with me or my healthcare representative as part of my discharge preparation.   ° °Why was Eliquis® prescribed for you? °Eliquis® was prescribed for you to reduce the risk of a blood clot forming that can cause a stroke if you have a medical condition called atrial fibrillation (a type of irregular heartbeat). ° °What do You need to know about Eliquis® ? °Take your Eliquis® TWICE DAILY - one tablet in the morning and one tablet in the evening with or without food. If  you have difficulty swallowing the tablet whole please discuss with your pharmacist how to take the medication safely. ° °Take Eliquis® exactly as prescribed by your doctor and DO NOT stop taking Eliquis® without talking to the doctor who prescribed the medication.  Stopping may increase your risk of developing a stroke.  Refill your prescription before you run out. ° °After discharge, you should have regular check-up appointments with your healthcare provider that is prescribing your Eliquis®.  In the future your dose may need to be changed if your kidney function or weight changes by a significant amount or as you get older. ° °What do you do if you miss a dose? °If you miss a dose, take it as soon as you remember on the same day and resume taking twice daily.  Do not take more than one dose of ELIQUIS at the same time to make up a missed dose. ° °Important Safety Information °A possible side effect of Eliquis® is bleeding. You should call your healthcare provider right away if you experience any of the following: °? Bleeding from an injury or your nose that does not stop. °? Unusual colored urine (red or dark brown) or unusual colored stools (red or black). °? Unusual bruising for unknown reasons. °? A serious fall or if you hit your head (even if there is no bleeding). ° °Some medicines may interact with Eliquis® and might increase your risk of bleeding or clotting while on Eliquis®. To help   avoid this, consult your healthcare provider or pharmacist prior to using any new prescription or non-prescription medications, including herbals, vitamins, non-steroidal anti-inflammatory drugs (NSAIDs) and supplements. ° °This website has more information on Eliquis® (apixaban): http://www.eliquis.com/eliquis/home ° ° °

## 2017-10-10 ENCOUNTER — Encounter (HOSPITAL_COMMUNITY): Payer: Self-pay

## 2017-10-10 ENCOUNTER — Emergency Department (HOSPITAL_COMMUNITY): Payer: Medicare Other

## 2017-10-10 ENCOUNTER — Inpatient Hospital Stay (HOSPITAL_COMMUNITY)
Admission: EM | Admit: 2017-10-10 | Discharge: 2017-10-14 | DRG: 308 | Disposition: A | Discharge status: Home / Self Care | Payer: Medicare Other | Attending: Internal Medicine | Admitting: Internal Medicine

## 2017-10-10 ENCOUNTER — Other Ambulatory Visit: Payer: Self-pay

## 2017-10-10 DIAGNOSIS — Z96653 Presence of artificial knee joint, bilateral: Secondary | ICD-10-CM | POA: Diagnosis present

## 2017-10-10 DIAGNOSIS — I5023 Acute on chronic systolic (congestive) heart failure: Secondary | ICD-10-CM | POA: Diagnosis not present

## 2017-10-10 DIAGNOSIS — I2510 Atherosclerotic heart disease of native coronary artery without angina pectoris: Secondary | ICD-10-CM | POA: Diagnosis present

## 2017-10-10 DIAGNOSIS — E785 Hyperlipidemia, unspecified: Secondary | ICD-10-CM | POA: Diagnosis present

## 2017-10-10 DIAGNOSIS — Z79899 Other long term (current) drug therapy: Secondary | ICD-10-CM | POA: Diagnosis not present

## 2017-10-10 DIAGNOSIS — R001 Bradycardia, unspecified: Secondary | ICD-10-CM | POA: Diagnosis present

## 2017-10-10 DIAGNOSIS — I447 Left bundle-branch block, unspecified: Secondary | ICD-10-CM | POA: Diagnosis present

## 2017-10-10 DIAGNOSIS — R609 Edema, unspecified: Secondary | ICD-10-CM | POA: Diagnosis not present

## 2017-10-10 DIAGNOSIS — R000 Tachycardia, unspecified: Secondary | ICD-10-CM | POA: Diagnosis present

## 2017-10-10 DIAGNOSIS — F419 Anxiety disorder, unspecified: Secondary | ICD-10-CM | POA: Diagnosis present

## 2017-10-10 DIAGNOSIS — Q230 Congenital stenosis of aortic valve: Secondary | ICD-10-CM | POA: Diagnosis not present

## 2017-10-10 DIAGNOSIS — N179 Acute kidney failure, unspecified: Secondary | ICD-10-CM | POA: Diagnosis present

## 2017-10-10 DIAGNOSIS — I429 Cardiomyopathy, unspecified: Secondary | ICD-10-CM | POA: Diagnosis present

## 2017-10-10 DIAGNOSIS — I130 Hypertensive heart and chronic kidney disease with heart failure and stage 1 through stage 4 chronic kidney disease, or unspecified chronic kidney disease: Secondary | ICD-10-CM | POA: Diagnosis present

## 2017-10-10 DIAGNOSIS — I5022 Chronic systolic (congestive) heart failure: Secondary | ICD-10-CM | POA: Diagnosis present

## 2017-10-10 DIAGNOSIS — E876 Hypokalemia: Secondary | ICD-10-CM | POA: Diagnosis present

## 2017-10-10 DIAGNOSIS — J811 Chronic pulmonary edema: Secondary | ICD-10-CM | POA: Diagnosis not present

## 2017-10-10 DIAGNOSIS — N183 Chronic kidney disease, stage 3 (moderate): Secondary | ICD-10-CM | POA: Diagnosis present

## 2017-10-10 DIAGNOSIS — M1990 Unspecified osteoarthritis, unspecified site: Secondary | ICD-10-CM | POA: Diagnosis present

## 2017-10-10 DIAGNOSIS — Z8711 Personal history of peptic ulcer disease: Secondary | ICD-10-CM | POA: Diagnosis not present

## 2017-10-10 DIAGNOSIS — I110 Hypertensive heart disease with heart failure: Secondary | ICD-10-CM | POA: Diagnosis not present

## 2017-10-10 DIAGNOSIS — Q231 Congenital insufficiency of aortic valve: Secondary | ICD-10-CM

## 2017-10-10 DIAGNOSIS — I481 Persistent atrial fibrillation: Secondary | ICD-10-CM | POA: Diagnosis not present

## 2017-10-10 DIAGNOSIS — R0602 Shortness of breath: Secondary | ICD-10-CM | POA: Diagnosis not present

## 2017-10-10 DIAGNOSIS — I509 Heart failure, unspecified: Secondary | ICD-10-CM

## 2017-10-10 DIAGNOSIS — I5043 Acute on chronic combined systolic (congestive) and diastolic (congestive) heart failure: Secondary | ICD-10-CM | POA: Diagnosis not present

## 2017-10-10 DIAGNOSIS — I480 Paroxysmal atrial fibrillation: Secondary | ICD-10-CM | POA: Diagnosis not present

## 2017-10-10 DIAGNOSIS — Z791 Long term (current) use of non-steroidal anti-inflammatories (NSAID): Secondary | ICD-10-CM | POA: Diagnosis not present

## 2017-10-10 DIAGNOSIS — I4891 Unspecified atrial fibrillation: Secondary | ICD-10-CM

## 2017-10-10 DIAGNOSIS — Z7901 Long term (current) use of anticoagulants: Secondary | ICD-10-CM

## 2017-10-10 DIAGNOSIS — K219 Gastro-esophageal reflux disease without esophagitis: Secondary | ICD-10-CM | POA: Diagnosis present

## 2017-10-10 DIAGNOSIS — I428 Other cardiomyopathies: Secondary | ICD-10-CM | POA: Diagnosis not present

## 2017-10-10 LAB — COMPREHENSIVE METABOLIC PANEL
ALK PHOS: 89 U/L (ref 38–126)
ALT: 17 U/L (ref 17–63)
AST: 26 U/L (ref 15–41)
Albumin: 3.3 g/dL — ABNORMAL LOW (ref 3.5–5.0)
Anion gap: 12 (ref 5–15)
BUN: 27 mg/dL — AB (ref 6–20)
CO2: 21 mmol/L — AB (ref 22–32)
Calcium: 8.6 mg/dL — ABNORMAL LOW (ref 8.9–10.3)
Chloride: 100 mmol/L — ABNORMAL LOW (ref 101–111)
Creatinine, Ser: 1.36 mg/dL — ABNORMAL HIGH (ref 0.61–1.24)
GFR calc Af Amer: 56 mL/min — ABNORMAL LOW (ref >60)
GFR, EST NON AFRICAN AMERICAN: 49 mL/min — AB (ref >60)
Glucose, Bld: 115 mg/dL — ABNORMAL HIGH (ref 65–99)
POTASSIUM: 4.1 mmol/L (ref 3.5–5.1)
SODIUM: 133 mmol/L — AB (ref 135–145)
TOTAL PROTEIN: 6.3 g/dL — AB (ref 6.5–8.1)
Total Bilirubin: 1.4 mg/dL — ABNORMAL HIGH (ref 0.3–1.2)

## 2017-10-10 LAB — CBC WITH DIFFERENTIAL/PLATELET
ABS IMMATURE GRANULOCYTES: 0 10*3/uL (ref 0.0–0.1)
BASOS PCT: 0 %
Basophils Absolute: 0 10*3/uL (ref 0.0–0.1)
Eosinophils Absolute: 0.3 10*3/uL (ref 0.0–0.7)
Eosinophils Relative: 4 %
HCT: 38.5 % — ABNORMAL LOW (ref 39.0–52.0)
HEMOGLOBIN: 12.8 g/dL — AB (ref 13.0–17.0)
IMMATURE GRANULOCYTES: 0 %
LYMPHS PCT: 16 %
Lymphs Abs: 1.1 10*3/uL (ref 0.7–4.0)
MCH: 32.8 pg (ref 26.0–34.0)
MCHC: 33.2 g/dL (ref 30.0–36.0)
MCV: 98.7 fL (ref 78.0–100.0)
MONO ABS: 0.5 10*3/uL (ref 0.1–1.0)
MONOS PCT: 7 %
NEUTROS ABS: 4.9 10*3/uL (ref 1.7–7.7)
NEUTROS PCT: 73 %
PLATELETS: 196 10*3/uL (ref 150–400)
RBC: 3.9 MIL/uL — ABNORMAL LOW (ref 4.22–5.81)
RDW: 15 % (ref 11.5–15.5)
WBC: 6.8 10*3/uL (ref 4.0–10.5)

## 2017-10-10 LAB — BRAIN NATRIURETIC PEPTIDE: B NATRIURETIC PEPTIDE 5: 3394.4 pg/mL — AB (ref 0.0–100.0)

## 2017-10-10 LAB — TROPONIN I: Troponin I: 0.24 ng/mL (ref <0.03)

## 2017-10-10 LAB — MAGNESIUM: MAGNESIUM: 1.9 mg/dL (ref 1.7–2.4)

## 2017-10-10 MED ORDER — RIVAROXABAN 15 MG PO TABS
15.0000 mg | ORAL_TABLET | Freq: Every day | ORAL | Status: DC
  Administered 2017-10-10 – 2017-10-13 (×4): 15 mg via ORAL
  Filled 2017-10-10 (×4): qty 1

## 2017-10-10 MED ORDER — PANTOPRAZOLE SODIUM 40 MG PO TBEC
40.0000 mg | DELAYED_RELEASE_TABLET | Freq: Every day | ORAL | Status: DC
  Administered 2017-10-10 – 2017-10-14 (×4): 40 mg via ORAL
  Filled 2017-10-10 (×4): qty 1

## 2017-10-10 MED ORDER — FUROSEMIDE 10 MG/ML IJ SOLN
40.0000 mg | Freq: Once | INTRAMUSCULAR | Status: AC
  Administered 2017-10-10: 40 mg via INTRAVENOUS
  Filled 2017-10-10: qty 4

## 2017-10-10 MED ORDER — AMIODARONE HCL 200 MG PO TABS
200.0000 mg | ORAL_TABLET | Freq: Every day | ORAL | Status: DC
  Administered 2017-10-10 – 2017-10-12 (×3): 200 mg via ORAL
  Filled 2017-10-10 (×3): qty 1

## 2017-10-10 MED ORDER — MELOXICAM 7.5 MG PO TABS
15.0000 mg | ORAL_TABLET | Freq: Every day | ORAL | Status: DC
  Administered 2017-10-11 – 2017-10-12 (×2): 15 mg via ORAL
  Filled 2017-10-10 (×3): qty 2

## 2017-10-10 MED ORDER — ACETAMINOPHEN 500 MG PO TABS: 1000.0000 mg | ORAL_TABLET | Freq: Four times a day (QID) | ORAL | Status: DC | PRN

## 2017-10-10 MED ORDER — CARVEDILOL 12.5 MG PO TABS
12.5000 mg | ORAL_TABLET | Freq: Two times a day (BID) | ORAL | Status: DC
  Administered 2017-10-10 – 2017-10-14 (×7): 12.5 mg via ORAL
  Filled 2017-10-10 (×8): qty 1

## 2017-10-10 MED ORDER — POTASSIUM CHLORIDE CRYS ER 20 MEQ PO TBCR
40.0000 meq | EXTENDED_RELEASE_TABLET | Freq: Every day | ORAL | Status: DC
  Administered 2017-10-10 – 2017-10-12 (×3): 40 meq via ORAL
  Filled 2017-10-10 (×3): qty 2

## 2017-10-10 MED ORDER — FUROSEMIDE 10 MG/ML IJ SOLN
40.0000 mg | Freq: Four times a day (QID) | INTRAMUSCULAR | Status: DC
  Administered 2017-10-10 – 2017-10-11 (×4): 40 mg via INTRAVENOUS
  Filled 2017-10-10 (×4): qty 4

## 2017-10-10 MED ORDER — ONDANSETRON HCL 4 MG/2ML IJ SOLN: 4.0000 mg | Freq: Four times a day (QID) | INTRAMUSCULAR | Status: DC | PRN

## 2017-10-10 MED ORDER — POLYVINYL ALCOHOL 1.4 % OP SOLN
2.0000 [drp] | Freq: Three times a day (TID) | OPHTHALMIC | Status: DC | PRN
  Filled 2017-10-10 (×2): qty 15

## 2017-10-10 MED ORDER — ACETAMINOPHEN 325 MG PO TABS: 650.0000 mg | ORAL_TABLET | ORAL | Status: DC | PRN

## 2017-10-10 MED ORDER — NITROGLYCERIN 0.4 MG SL SUBL: 0.4000 mg | SUBLINGUAL_TABLET | SUBLINGUAL | Status: DC | PRN

## 2017-10-10 MED ORDER — HYPROMELLOSE (GONIOSCOPIC) 2.5 % OP SOLN: 2.0000 [drp] | Freq: Three times a day (TID) | OPHTHALMIC | Status: DC | PRN

## 2017-10-10 NOTE — ED Notes (Signed)
Pt has a critical Trop 0.24.

## 2017-10-10 NOTE — ED Provider Notes (Addendum)
[facility] EMERGENCY DEPARTMENT Provider Note   CSN: [id number] Arrival date & time: 10/10/17  2751     History   Chief Complaint Chief Complaint  Patient presents with  . Shortness of Breath    HPI Travis [name] is a 77 y.o. male.  HPI   77 year old male with past medical history as below including CHF, A. fib status post recent ablation, here with recurrent shortness of breath.  Patient states that since his ablation on 5/14, he has noticed progressively worsening bilateral leg swelling.  Over the last 24 hours, he is noticed on his home pulse ox that he is gone into A. fib and has intermittently been de-satting to the upper 80s.  He has had heart rate anywhere from the 50s to the 140s.  He said associated mild nausea, which is usual for him during A. fib episodes.  Denies any chest pain.  No shortness of breath.  He has not weigh himself but believes he is gained significant weight due to increasing bilateral leg swelling.  Denies any other medication changes.  Is been taking Lasix 40 mg daily, but reports he has not been peeing as often as he normally does.  No fevers or chills.  No sputum production.  He has been taking his Xarelto as prescribed.  Past Medical History:  Diagnosis Date  . Acute kidney injury ([place])   . Arthritis   . Bicuspid aortic valve   . Cardiomyopathy -resolved    tachycardia-induced, EF 55% June 2009  . CHF (congestive heart failure) ([place])    JULY 2014  . GERD (gastroesophageal reflux disease)   . History of cardioversion 12/16/2012  . History of stomach ulcers   . Hypertension   . Left bundle branch block   . Persistent atrial fibrillation ([place])    multiple prior cardioversion  . Sinus bradycardia   . Status post clamping of cerebral aneurysm 80    Patient Active Problem List   Diagnosis Date Noted  . Persistent atrial fibrillation ([place]) 10/06/2017  . AKI (acute kidney injury) ([place]) 09/21/2017  . Acute on chronic combined  systolic and diastolic CHF (congestive heart failure) ([place]) 08/12/2017  . Macrocytic anemia 08/01/2015  . Hyperlipidemia 12/19/2014  . Male pattern baldness 03/06/2014  . Anxiety state 03/06/2014  . Insomnia 03/06/2014  . Overweight (BMI 25.0-29.9) 02/08/2014  . S/P TKR (total knee replacement) 02/08/2014  . Sinus bradycardia 01/04/2013  . Pulmonary hypertension ([place]) 03/09/2012  . Hyponatremia 02/21/2012  . Bicuspid aortic valve   . Paroxysmal atrial fibrillation ([facility])   . Hypertension   . Cardiomyopathy, rate related with resolution now recurrent 12/08/2011  . Left bundle branch block 12/10/2010  . DRY MOUTH 11/07/2009  . Carotid bruit 07/05/2009  . GERD 01/07/2008  . Osteoarthritis 02/04/2007  . GANGLION CYST, WRIST, LEFT 02/04/2007    Past Surgical History:  Procedure Laterality Date  . ATRIAL FIBRILLATION ABLATION N/A 10/06/2017   Procedure: ATRIAL FIBRILLATION ABLATION;  Surgeon: [name], MD;  Location: [facility] LAB;  Service: Cardiovascular;  Laterality: N/A;  . CARDIAC CATHETERIZATION    . CARDIOVERSION N/A 12/16/2012   Procedure: CARDIOVERSION;  Surgeon: Lelon Perla, MD;  Location: [facility] ENDOSCOPY;  Service: Cardiovascular;  Laterality: N/A;  . CARDIOVERSION N/A 08/14/2017   Procedure: CARDIOVERSION;  Surgeon: Josue Hector, MD;  Location[facility] ENDOSCOPY;  Service: Cardiovascular;  Laterality: N/A;  . CARDIOVERSION N/A 09/21/2017   Procedure: CARDIOVERSION;  Surgeon: Sanda Klein, MD;  Location: [facility] ENDOSCOPY;  Service: Cardiovascular;  Laterality: N/A;  . CATARACT EXTRACTION  05/2015   bilateral  . cerebral anuersym post clips    . fractured left arm    . HERNIA REPAIR     lft  . INGUINAL HERNIA REPAIR  07/02/2011   Procedure: LAPAROSCOPIC INGUINAL HERNIA;  Surgeon: Harl Bowie, MD;  Location: [facility];  Service: General;  Laterality: Left;  Laparoscopic left inguinal hernia repair and mesh  . left tendon repair     lft foot  . ROTATOR CUFF REPAIR       lf  . TEE WITHOUT CARDIOVERSION N/A 08/14/2017   Procedure: TRANSESOPHAGEAL ECHOCARDIOGRAM (TEE);  Surgeon: Josue Hector, MD;  Location: [facility] ENDOSCOPY;  Service: Cardiovascular;  Laterality: N/A;  . TONSILLECTOMY    . TOTAL KNEE ARTHROPLASTY Bilateral 02/06/2014   Procedure: TOTAL KNEE BILATERAL;  Surgeon: Mauri Pole, MD;  Location: [facility] ORS;  Service: Orthopedics;  Laterality: Bilateral;  . WRIST GANGLION EXCISION     lft        Home Medications    Prior to Admission medications   Medication Sig Start Date End Date Taking? Authorizing Provider  acetaminophen (TYLENOL) 500 MG tablet Take 1,000 mg by mouth every 6 (six) hours as needed for mild pain or headache.    Yes [provider]  amiodarone (PACERONE) 200 MG tablet Take 1 tablet (200 mg total) by mouth daily. 10/07/17  Yes Seiler, Amber K, NP  atorvastatin (LIPITOR) 40 MG tablet Take 1 tablet (40 mg total) by mouth once a week. Patient taking differently: Take 40 mg by mouth every Sunday.  07/31/16  Yes [name], MD  carvedilol (COREG) 12.5 MG tablet Take 1 tablet (12.5 mg total) by mouth 2 (two) times daily with a meal. 09/21/17  Yes Kathyrn Drown D, NP  finasteride (PROPECIA) 1 MG tablet TAKE 1 TABLET(1 MG) BY MOUTH DAILY 10/05/17  Yes [name], MD  furosemide (LASIX) 40 MG tablet Take 1 tablet (40 mg total) by mouth daily. 08/17/17  Yes Bhagat, Bhavinkumar, PA  hydroxypropyl methylcellulose / hypromellose (ISOPTO TEARS / GONIOVISC) 2.5 % ophthalmic solution Place 2 drops into both eyes 3 (three) times daily as needed for dry eyes.   Yes [provider]  meloxicam (MOBIC) 15 MG tablet TAKE 1 TABLET BY MOUTH DAILY 10/07/17  Yes [name], MD  omeprazole (PRILOSEC) 20 MG capsule TAKE 1 CAPSULE BY MOUTH DAILY Patient taking differently: Take 20 mg by mouth once a day 08/03/17  Yes Milus Banister, MD  potassium chloride SA (K-DUR,KLOR-CON) 20 MEQ tablet Take 2 tablets (40 mEq total) by mouth  daily. 09/22/17  Yes Kathyrn Drown D, NP  Rivaroxaban (XARELTO) 15 MG TABS tablet Take 1 tablet (15 mg total) by mouth daily with supper. 09/21/17  Yes Kathyrn Drown D, NP  temazepam (RESTORIL) 15 MG capsule TAKE ONE CAPSULE BY MOUTH AT BEDTIME AS NEEDED Patient taking differently: Take 15 mg by mouth at bedtime as needed for sleep 07/27/17  Yes [name], MD  ALPRAZolam Duanne Moron) 0.5 MG tablet TAKE 1 TABLET BY MOUTH TWICE DAILY AS NEEDED FOR ANXIETY( SEPARATE BY 8 HOURS FROM TEMAZEPAM) 09/25/17   [name], MD    Family History Family History  Problem Relation Age of Onset  . Stroke Father 3       smoker  . Lung cancer Mother 45       former smoker  . Stroke Mother   . Stroke Brother   .  Colon cancer Neg Hx     Social History Social History   Tobacco Use  . Smoking status: Former Smoker    Packs/day: 1.50    Years: 25.00    Pack years: 37.50    Last attempt to quit: 05/26/1986    Years since quitting: 31.3  . Smokeless tobacco: Never Used  Substance Use Topics  . Alcohol use: Yes    Alcohol/week: 0.5 oz    Types: 1 Standard drinks or equivalent per week    Comment: stopped drinking   . Drug use: No     Allergies   Patient has no known allergies.   Review of Systems Review of Systems  Constitutional: Positive for fatigue. Negative for chills and fever.  HENT: Negative for congestion and rhinorrhea.   Eyes: Negative for visual disturbance.  Respiratory: Positive for shortness of breath. Negative for cough and wheezing.   Cardiovascular: Positive for palpitations and leg swelling. Negative for chest pain.  Gastrointestinal: Positive for nausea. Negative for abdominal pain, diarrhea and vomiting.  Genitourinary: Negative for dysuria and flank pain.  Musculoskeletal: Negative for neck pain and neck stiffness.  Skin: Negative for rash and wound.  Allergic/Immunologic: Negative for immunocompromised state.  Neurological: Positive for weakness. Negative for  syncope and headaches.  All other systems reviewed and are negative.    Physical Exam Updated Vital Signs BP (!) 138/97   Pulse 84   Temp 98 F (36.7 C) (Oral)   Resp (!) 30   Ht 5\' 8"  (1.727 m)   Wt 79.4 kg (175 lb)   SpO2 93%   BMI 26.61 kg/m   Physical Exam  Constitutional: He is oriented to person, place, and time. He appears well-developed and well-nourished. No distress.  HENT:  Head: Normocephalic and atraumatic.  Eyes: Conjunctivae are normal.  Neck: Neck supple.  Cardiovascular: Normal heart sounds. An irregularly irregular rhythm present. Tachycardia present. Exam reveals no friction rub.  No murmur heard. Pulmonary/Chest: Effort normal and breath sounds normal. No respiratory distress. He has no wheezes. He has no rales.  Abdominal: He exhibits no distension.  Musculoskeletal: He exhibits edema (3+ pitting bilateral LE).  Neurological: He is alert and oriented to person, place, and time. He exhibits normal muscle tone.  Skin: Skin is warm. Capillary refill takes less than 2 seconds.  Psychiatric: He has a normal mood and affect.  Nursing note and vitals reviewed.    ED Treatments / Results  Labs (all labs ordered are listed, but only abnormal results are displayed) Labs Reviewed  CBC WITH DIFFERENTIAL/PLATELET - Abnormal; Notable for the following components:      Result Value   RBC 3.90 (*)    Hemoglobin 12.8 (*)    HCT 38.5 (*)    All other components within normal limits  COMPREHENSIVE METABOLIC PANEL - Abnormal; Notable for the following components:   Sodium 133 (*)    Chloride 100 (*)    CO2 21 (*)    Glucose, Bld 115 (*)    BUN 27 (*)    Creatinine, Ser 1.36 (*)    Calcium 8.6 (*)    Total Protein 6.3 (*)    Albumin 3.3 (*)    Total Bilirubin 1.4 (*)    GFR calc non Af Amer 49 (*)    GFR calc Af Amer 56 (*)    All other components within normal limits  TROPONIN I - Abnormal; Notable for the following components:   Troponin I 0.24 (*)  All other components within normal limits  BRAIN NATRIURETIC PEPTIDE - Abnormal; Notable for the following components:   B Natriuretic Peptide 3,394.4 (*)    All other components within normal limits  MAGNESIUM    EKG EKG Interpretation  Date/Time:  Saturday Oct 10 2017 07:48:22 EDT Ventricular Rate:  100 PR Interval:    QRS Duration: 157 QT Interval:  404 QTC Calculation: 522 R Axis:   -82 Text Interpretation:  Atrial fibrillation Nonspecific IVCD with LAD Left ventricular hypertrophy Since last EKG, AFib has replaced sinus rhythm Confirmed by Duffy Bruce [id number]) on 10/10/2017 8:04:59 AM Also confirmed by Duffy Bruce [id number]), editor Philomena Doheny [phone])  on 10/10/2017 8:36:27 AM   Radiology Dg Chest 2 View  Result Date: 10/10/2017 CLINICAL DATA:  Bilateral lower extremity swelling. EXAM: CHEST - 2 VIEW COMPARISON:  Chest x-ray dated September 17, 2017. FINDINGS: Stable cardiomegaly. Diffusely increased interstitial thickening is slightly progressed when compared to prior study. Small bilateral pleural effusions. Left-greater-than-right basilar atelectasis. No consolidation or pneumothorax. No acute osseous abnormality. IMPRESSION: Worsening interstitial pulmonary edema and small bilateral pleural effusions. Electronically Signed   By: Titus Dubin M.D.   On: 10/10/2017 08:31    Procedures .Critical Care Performed by: Duffy Bruce, MD Authorized by: Duffy Bruce, MD   Critical care provider statement:    Critical care time (minutes):  35   Critical care time was exclusive of:  Separately billable procedures and treating other patients and teaching time   Critical care was necessary to treat or prevent imminent or life-threatening deterioration of the following conditions:  Cardiac failure, circulatory failure and respiratory failure   Critical care was time spent personally by me on the following activities:  Development of treatment plan with patient or surrogate,  discussions with consultants, evaluation of patient's response to treatment, examination of patient, obtaining history from patient or surrogate, ordering and performing treatments and interventions, ordering and review of laboratory studies, ordering and review of radiographic studies, pulse oximetry, re-evaluation of patient's condition and review of old charts   I assumed direction of critical care for this patient from another provider in my specialty: no     (including critical care time)  Medications Ordered in ED Medications  furosemide (LASIX) injection 40 mg (has no administration in time range)  furosemide (LASIX) injection 40 mg (40 mg Intravenous Given 10/10/17 0915)     Initial Impression / Assessment and Plan / ED Course  I have reviewed the triage vital signs and the nursing notes.  Pertinent labs & imaging results that were available during my care of the patient were reviewed by me and considered in my medical decision making (see chart for details).  Clinical Course as of Oct 10 1413  Sat Oct 10, 2017  0820 CXR concerning for pulmonary edema.   [CI]    Clinical Course User Index [CI] Duffy Bruce, MD    77 yo F with PMHx as above here with recurrent symptomatic AFib. Suspect this is 2/2 CHF with hypervolemia contributing to AFib. Lungs sound clear, likely R-sided HF. Will check labs, d/w Cards given recent ablation. EKG confirms recurrence of AFib, is o/w without change.  Patient with good output after Lasix.  He remains hemodynamically stable, though intermittently tachycardic.  Admit to cardiology.   Final Clinical Impressions(s) / ED Diagnoses   Final diagnoses:  Acute on chronic congestive heart failure, unspecified heart failure type [facility])    ED Discharge Orders    None  Duffy Bruce, MD 10/10/17 1415    Duffy Bruce, MD 10/19/17 [phone]

## 2017-10-10 NOTE — ED Notes (Signed)
Patient transported to X-ray 

## 2017-10-10 NOTE — Plan of Care (Signed)
  Problem: Education: Goal: Ability to verbalize understanding of medication therapies will improve Outcome: Progressing   Problem: Activity: Goal: Capacity to carry out activities will improve Outcome: Progressing   

## 2017-10-10 NOTE — H&P (Addendum)
Cardiology Admission History and Physical:   Patient ID: [name]; MRN: [id number]; DOB: 08/18/1940   Admission date: 10/10/2017  Primary Care Provider: [name], MD Primary Cardiologist: Virl Axe, MD   Chief Complaint:  LE edema and afib   Patient Profile:   Travis [name] is a 77 y.o. male with a history of persistent atrial fibrillation ( multiple failed cardioversion this year, failed Tikosyn and failed ablation last week), by cuspid aortic valve with moderate aortic stenosis and mild aortic insufficiency, chronic systolic heart failure with EF of 30 to 35%,  Hypertension and hyperlipidemia came to [facility] for evaluation of worsening lower extremity edema and atrial fibrillation.  History of Present Illness:    Travis [name] has failed cardioversion on 08/14/17, 08/31/17 & 09/17/17.  Initiated diagnosed with atrial fibrillation when admitted for pulmonary edema in setting of acute respiratory failure March 2019.  Patient started on amiodarone after failed Tikosyn.  Last admission end of April for acute on chronic heart failure with A. fib RVR.  He converted to sinus rhythm on IV amiodarone and DCCV.  Patient underwent successful ablation 10/07/2017 by [name].  Since discharge patient has noted progressive worsening of lower extremity edema despite compliant with low-sodium diet and medication.  He went to A. fib yesterday.  He is symptomatic with shortness of breath.  Heart rate was ranging between 100-120s leading to ER presentation.  Serum creatinine 1.36.  Troponin I 0.24.  Hemoglobin 12.8.  Checks x-ray shows worsening pulmonary edema.  He was given IV Lasix 40 mg x1.  Almost 1 L urine output.  EKG shows atrial fibrillation at rate of 100 bpm.  Past Medical History:  Diagnosis Date  . Acute kidney injury ([place])   . Arthritis   . Bicuspid aortic valve   . Cardiomyopathy -resolved    tachycardia-induced, EF 55% June 2009  . CHF (congestive heart  failure) ([place])    JULY 2014  . GERD (gastroesophageal reflux disease)   . History of cardioversion 12/16/2012  . History of stomach ulcers   . Hypertension   . Left bundle branch block   . Persistent atrial fibrillation ([place])    multiple prior cardioversion  . Sinus bradycardia   . Status post clamping of cerebral aneurysm 80    Past Surgical History:  Procedure Laterality Date  . ATRIAL FIBRILLATION ABLATION N/A 10/06/2017   Procedure: ATRIAL FIBRILLATION ABLATION;  Surgeon: [name], MD;  Location: [facility];  Service: Cardiovascular;  Laterality: N/A;  . CARDIAC CATHETERIZATION    . CARDIOVERSION N/A 12/16/2012   Procedure: CARDIOVERSION;  Surgeon: Lelon Perla, MD;  Location: [facility] ENDOSCOPY;  Service: Cardiovascular;  Laterality: N/A;  . CARDIOVERSION N/A 08/14/2017   Procedure: CARDIOVERSION;  Surgeon: Josue Hector, MD;  Location: [facility] ENDOSCOPY;  Service: Cardiovascular;  Laterality: N/A;  . CARDIOVERSION N/A 09/21/2017   Procedure: CARDIOVERSION;  Surgeon: Sanda Marcile Fuquay, MD;  Location: [facility] ENDOSCOPY;  Service: Cardiovascular;  Laterality: N/A;  . CATARACT EXTRACTION  05/2015   bilateral  . cerebral anuersym post clips    . fractured left arm    . HERNIA REPAIR     lft  . INGUINAL HERNIA REPAIR  07/02/2011   Procedure: LAPAROSCOPIC INGUINAL HERNIA;  Surgeon: Harl Bowie, MD;  Location: [facility];  Service: General;  Laterality: Left;  Laparoscopic left inguinal hernia repair and mesh  . left tendon repair     lft foot  . ROTATOR CUFF REPAIR  lf  . TEE WITHOUT CARDIOVERSION N/A 08/14/2017   Procedure: TRANSESOPHAGEAL ECHOCARDIOGRAM (TEE);  Surgeon: Josue Hector, MD;  Location: [facility] ENDOSCOPY;  Service: Cardiovascular;  Laterality: N/A;  . TONSILLECTOMY    . TOTAL KNEE ARTHROPLASTY Bilateral 02/06/2014   Procedure: TOTAL KNEE BILATERAL;  Surgeon: Mauri Pole, MD;  Location: [facility] ORS;  Service: Orthopedics;  Laterality: Bilateral;  . WRIST GANGLION EXCISION      lft     Medications Prior to Admission: Prior to Admission medications   Medication Sig Start Date End Date Taking? Authorizing Provider  acetaminophen (TYLENOL) 500 MG tablet Take 1,000 mg by mouth every 6 (six) hours as needed for mild pain or headache.    Yes [provider]  amiodarone (PACERONE) 200 MG tablet Take 1 tablet (200 mg total) by mouth daily. 10/07/17  Yes Seiler, Amber K, NP  atorvastatin (LIPITOR) 40 MG tablet Take 1 tablet (40 mg total) by mouth once a week. Patient taking differently: Take 40 mg by mouth every Sunday.  07/31/16  Yes [name], MD  carvedilol (COREG) 12.5 MG tablet Take 1 tablet (12.5 mg total) by mouth 2 (two) times daily with a meal. 09/21/17  Yes Kathyrn Drown D, NP  finasteride (PROPECIA) 1 MG tablet TAKE 1 TABLET(1 MG) BY MOUTH DAILY 10/05/17  Yes [name], MD  furosemide (LASIX) 40 MG tablet Take 1 tablet (40 mg total) by mouth daily. 08/17/17  Yes Bhagat, Bhavinkumar, PA  hydroxypropyl methylcellulose / hypromellose (ISOPTO TEARS / GONIOVISC) 2.5 % ophthalmic solution Place 2 drops into both eyes 3 (three) times daily as needed for dry eyes.   Yes [provider]  meloxicam (MOBIC) 15 MG tablet TAKE 1 TABLET BY MOUTH DAILY 10/07/17  Yes [name], MD  omeprazole (PRILOSEC) 20 MG capsule TAKE 1 CAPSULE BY MOUTH DAILY Patient taking differently: Take 20 mg by mouth once a day 08/03/17  Yes Milus Banister, MD  potassium chloride SA (K-DUR,KLOR-CON) 20 MEQ tablet Take 2 tablets (40 mEq total) by mouth daily. 09/22/17  Yes Kathyrn Drown D, NP  Rivaroxaban (XARELTO) 15 MG TABS tablet Take 1 tablet (15 mg total) by mouth daily with supper. 09/21/17  Yes Kathyrn Drown D, NP  temazepam (RESTORIL) 15 MG capsule TAKE ONE CAPSULE BY MOUTH AT BEDTIME AS NEEDED Patient taking differently: Take 15 mg by mouth at bedtime as needed for sleep 07/27/17  Yes [name], MD  ALPRAZolam Duanne Moron) 0.5 MG tablet TAKE 1 TABLET BY  MOUTH TWICE DAILY AS NEEDED FOR ANXIETY( SEPARATE BY 8 HOURS FROM TEMAZEPAM) 09/25/17   [name], MD     Allergies:   No Known Allergies  Social History:   Social History   Socioeconomic History  . Marital status: Single    Spouse name: Not on file  . Number of children: 0  . Years of education: Not on file  . Highest education level: Not on file  Occupational History  . Occupation: Retired  Scientific laboratory technician  . Financial resource strain: Not on file  . Food insecurity:    Worry: Not on file    Inability: Not on file  . Transportation needs:    Medical: Not on file    Non-medical: Not on file  Tobacco Use  . Smoking status: Former Smoker    Packs/day: 1.50    Years: 25.00    Pack years: 37.50    Last attempt to quit: 05/26/1986    Years since  quitting: 31.3  . Smokeless tobacco: Never Used  Substance and Sexual Activity  . Alcohol use: Yes    Alcohol/week: 0.5 oz    Types: 1 Standard drinks or equivalent per week    Comment: stopped drinking   . Drug use: No  . Sexual activity: Not Currently  Lifestyle  . Physical activity:    Days per week: Not on file    Minutes per session: Not on file  . Stress: Not on file  Relationships  . Social connections:    Talks on phone: Not on file    Gets together: Not on file    Attends religious service: Not on file    Active member of club or organization: Not on file    Attends meetings of clubs or organizations: Not on file    Relationship status: Not on file  . Intimate partner violence:    Fear of current or ex partner: Not on file    Emotionally abused: Not on file    Physically abused: Not on file    Forced sexual activity: Not on file  Other Topics Concern  . Not on file  Social History Narrative   Homosexual. Lives alone. Not sexually active.       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 There is no history of Colon cancer.    ROS:  Please see the history of present illness.  All other ROS reviewed and negative.     Physical Exam/Data:   Vitals:   10/10/17 1100 10/10/17 1115 10/10/17 1130 10/10/17 1145  BP: (!) 141/96 (!) 141/82  (!) 149/92  Pulse: 81 (!) 45 (!) 114 (!) 50  Resp: (!) 31 (!) 26 (!) 23 14  Temp:      TempSrc:      SpO2: 93% 93% 94% 91%  Weight:      Height:        Intake/Output Summary (Last 24 hours) at 10/10/2017 1201 Last data filed at 10/10/2017 1026 Gross per 24 hour  Intake -  Output 800 ml  Net -800 ml   Filed Weights   10/10/17 0720  Weight: 175 lb (79.4 kg)   Body mass index is 26.61 kg/m.  General:  Well nourished, well developed, in no acute distress HEENT: normal Lymph: no adenopathy Neck: + JVD Endocrine:  No thryomegaly Vascular: No carotid bruits; FA pulses 2+ bilaterally without bruits  Cardiac:  normal S1, S2; Ir Ir, + murmur Lungs: Faint bibasilar Rales  abd: soft, nontender, no hepatomegaly, + distension Ext: 2+ BL LE edema extending to his abdomen Musculoskeletal:  No deformities, BUE and BLE strength normal and equal Skin: warm and dry  Neuro:  CNs 2-12 intact, no focal abnormalities noted Psych:  Normal affect    Relevant CV Studies: Echo 08/13/17 Conclusions  - Left ventricle: The cavity size was normal. Wall thickness was   increased in a pattern of moderate LVH. Systolic function was   moderately to severely reduced. The estimated ejection fraction   was in the range of 30% to 35%. - Aortic valve: AV is thickened, calcified with restricted motion.   Peak and mean gradients through the valve are 16 and 11  mm Hg   respectively. Dimensionless index is 0.48. 2 D imaging suggests   moderate AS. There was mild regurgitation. - Left atrium: The atrium was  severely dilated. - Right ventricle: The cavity size was mildly dilated. Systolic   function was mildly to moderately reduced. - Pulmonary arteries: PA peak pressure: 46 mm Hg (S).  Laboratory Data:  Chemistry Recent Labs  Lab 10/07/17 0401 10/10/17 0912  NA 135 133*  K 4.4 4.1  CL 102 100*  CO2 21* 21*  GLUCOSE 116* 115*  BUN 23* 27*  CREATININE 1.45* 1.36*  CALCIUM 8.3* 8.6*  GFRNONAA 45* 49*  GFRAA 52* 56*  ANIONGAP 12 12    Recent Labs  Lab 10/10/17 0912  PROT 6.3*  ALBUMIN 3.3*  AST 26  ALT 17  ALKPHOS 89  BILITOT 1.4*   Hematology Recent Labs  Lab 10/10/17 0912  WBC 6.8  RBC 3.90*  HGB 12.8*  HCT 38.5*  MCV 98.7  MCH 32.8  MCHC 33.2  RDW 15.0  PLT 196   Cardiac Enzymes Recent Labs  Lab 10/10/17 0912  TROPONINI 0.24*   Radiology/Studies:  Dg Chest 2 View  Result Date: 10/10/2017 CLINICAL DATA:  Bilateral lower extremity swelling. EXAM: CHEST - 2 VIEW COMPARISON:  Chest x-ray dated September 17, 2017. FINDINGS: Stable cardiomegaly. Diffusely increased interstitial thickening is slightly progressed when compared to prior study. Small bilateral pleural effusions. Left-greater-than-right basilar atelectasis. No consolidation or pneumothorax. No acute osseous abnormality. IMPRESSION: Worsening interstitial pulmonary edema and small bilateral pleural effusions. Electronically Signed   By: Titus Dubin M.D.   On: 10/10/2017 08:31    Assessment and Plan:   1. Persistent atrial fibrillation - Failed cardioversion x3 (08/14/17, 08/31/17 & 09/17/17).  Failed Tikosyn and now amiodarone. Failed ablation 10/07/2017.  Continue Xarelto 15 mg for anticoagulation.  Age and renal dose appropriate.  2.  Acute on chronic systolic heart failure -Chest x-ray showed worsening infiltrates pulmonary edema with bilateral pleural effusion.  Exam consistent with significant volume overload.  He is diuresed well after 1 dose of IV Lasix 40 mg.  Questionable likely exacerbated by  persistent atrial fibrillation.  3.  Elevated troponin -No chest pain.  Likely demand in setting of A. fib and CHF exacerbation.  4.  Bicuspid aortic valve with moderate left ear and mild AI  Severity of Illness: The appropriate patient status for this patient is INPATIENT. Inpatient status is judged to be reasonable and necessary in order to provide the required intensity of service to ensure the patient's safety. The patient's presenting symptoms, physical exam findings, and initial radiographic and laboratory data in the context of their chronic comorbidities is felt to place them at high risk for further clinical deterioration. Furthermore, it is not anticipated that the patient will be medically stable for discharge from the hospital within 2 midnights of admission. The following factors support the patient status of inpatient.   " The patient's presenting symptoms include - SOB, LE edema and afib. " The worrisome physical exam findings include volume overload " The initial radiographic and laboratory data are worrisome because of Pulmonary edema on CXR " The chronic co-morbidities include afib and chronic systolic CHF   * I certify that at the point of admission it is my clinical judgment that the patient will require inpatient hospital care spanning beyond 2 midnights from the point of admission due to high intensity of service, high risk for further deterioration and high frequency of surveillance required.*  For questions or updates, please contact [place] Please consult [email or web address] for contact info under Cardiology/STEMI.    Jarrett Soho, [place]  10/10/2017 12:01 PM    Atrial Fibrillation  Recurrence  CHF acute//chronic systolic  Aortic Stenosis- mod  IVCD    Will admit for IV diuresis  Will attempt cardioversion on Monday.  It is almost certain, based on Dr. Jackalyn Lombard impression in the lab that is recurrent atrial fibrillation was expected and  that AV ablation and pacing would be the next step.  We will plan to try and see whether there is any interval improvement in his atrial fibrillation burden treatment is acute on chronic congestive heart failure; I have not sanguine.  We will plan to review and make a decision regarding junction ablation patient next 3-6 weeks

## 2017-10-10 NOTE — ED Notes (Signed)
Attempted report x1. 

## 2017-10-10 NOTE — ED Triage Notes (Addendum)
Per EMS: pt from home with c/o SOB x1 day.  Pt has a hx of AFIB and recently had an ablation procedure.  Pt denies CP but does endorse bilateral lower extremity swelling.  PTA vitals: BP 140/90, HR 80-120, RR 16, O2 100% on 4L.  Pt has oxygen at home for PRN use.

## 2017-10-10 NOTE — ED Notes (Signed)
Received verbal for 40 mg Lasix by Caryl Comes MD

## 2017-10-11 DIAGNOSIS — Q230 Congenital stenosis of aortic valve: Secondary | ICD-10-CM

## 2017-10-11 DIAGNOSIS — Q231 Congenital insufficiency of aortic valve: Secondary | ICD-10-CM

## 2017-10-11 DIAGNOSIS — I428 Other cardiomyopathies: Secondary | ICD-10-CM

## 2017-10-11 LAB — BASIC METABOLIC PANEL
Anion gap: 15 (ref 5–15)
BUN: 22 mg/dL — ABNORMAL HIGH (ref 6–20)
CALCIUM: 8.8 mg/dL — AB (ref 8.9–10.3)
CHLORIDE: 91 mmol/L — AB (ref 101–111)
CO2: 28 mmol/L (ref 22–32)
Creatinine, Ser: 1.38 mg/dL — ABNORMAL HIGH (ref 0.61–1.24)
GFR calc Af Amer: 55 mL/min — ABNORMAL LOW (ref >60)
GFR calc non Af Amer: 48 mL/min — ABNORMAL LOW (ref >60)
GLUCOSE: 110 mg/dL — AB (ref 65–99)
Potassium: 3.2 mmol/L — ABNORMAL LOW (ref 3.5–5.1)
Sodium: 134 mmol/L — ABNORMAL LOW (ref 135–145)

## 2017-10-11 LAB — CBC
HCT: 43.1 % (ref 39.0–52.0)
HEMOGLOBIN: 14.4 g/dL (ref 13.0–17.0)
MCH: 33 pg (ref 26.0–34.0)
MCHC: 33.4 g/dL (ref 30.0–36.0)
MCV: 98.6 fL (ref 78.0–100.0)
Platelets: 243 10*3/uL (ref 150–400)
RBC: 4.37 MIL/uL (ref 4.22–5.81)
RDW: 15.1 % (ref 11.5–15.5)
WBC: 8.2 10*3/uL (ref 4.0–10.5)

## 2017-10-11 MED ORDER — POTASSIUM CHLORIDE CRYS ER 20 MEQ PO TBCR
40.0000 meq | EXTENDED_RELEASE_TABLET | ORAL | Status: AC
Start: 2017-10-11 — End: 2017-10-12
  Administered 2017-10-11 – 2017-10-12 (×4): 40 meq via ORAL
  Filled 2017-10-11 (×4): qty 2

## 2017-10-11 MED ORDER — FUROSEMIDE 10 MG/ML IJ SOLN
80.0000 mg | Freq: Three times a day (TID) | INTRAMUSCULAR | Status: DC
  Administered 2017-10-11 – 2017-10-12 (×2): 80 mg via INTRAVENOUS
  Filled 2017-10-11 (×2): qty 8

## 2017-10-11 NOTE — Progress Notes (Signed)
Progress Note  Patient Name: Travis [name] Date of Encounter: 10/11/2017  Primary Cardiologist:  sk  Primary Electrophysiologist: sk   Patient Profile     Travis [name] is a 77 y.o. male with a history of persistent atrial fibrillation ( multiple failed cardioversion this year, failed Tikosyn and failed ablation last week), by cuspid aortic valve with moderate aortic stenosis and mild aortic insufficiency, chronic systolic heart failure with EF of 30 to 35%,  Hypertension and hyperlipidemia came to [facility] for evaluation of worsening lower extremity edema and atrial fibrillation.     Subjective   Feels better diuresing but slowing down  Still edematous  Inpatient Medications    Scheduled Meds: . amiodarone  200 mg Oral Daily  . carvedilol  12.5 mg Oral BID WC  . furosemide  40 mg Intravenous Q6H  . meloxicam  15 mg Oral Daily  . pantoprazole  40 mg Oral Daily  . potassium chloride SA  40 mEq Oral Daily  . Rivaroxaban  15 mg Oral Q supper   Continuous Infusions:  PRN Meds: acetaminophen, nitroGLYCERIN, ondansetron (ZOFRAN) IV, polyvinyl alcohol   Vital Signs    Vitals:   10/10/17 1940 10/10/17 2335 10/11/17 0512 10/11/17 1135  BP: (!) 95/59 113/85 129/88 103/77  Pulse: 67 85 91 66  Resp: 16 16 18 18   Temp: (!) 97 F (36.1 C) (!) 97.5 F (36.4 C) 97.9 F (36.6 C) 97.6 F (36.4 C)  TempSrc:  Oral Oral Oral  SpO2: 93% 90% 92% 94%  Weight:   170 lb 4.8 oz (77.2 kg)   Height:        Intake/Output Summary (Last 24 hours) at 10/11/2017 1305 Last data filed at 10/11/2017 0925 Gross per 24 hour  Intake 2560 ml  Output 3650 ml  Net -1090 ml   Filed Weights   10/10/17 0720 10/10/17 1600 10/11/17 0512  Weight: 175 lb (79.4 kg) 179 lb 12.8 oz (81.6 kg) 170 lb 4.8 oz (77.2 kg)    Telemetry    Atrial fib with controlled VR - Personally Reviewed  ECG       Physical Exam   GEN: mildly short of breath with exertion ss.   Neck: JVD cm10 Cardiac:  IRR without  murmurs, rubs, or gallops.  Respiratory: Clear to auscultation bilaterally. GI: Soft, nontender, non-distended  MS: 3+ edema; No deformity. Neuro:  Nonfocal  Psych: Normal affect  Skin Warm and dry   Labs    Chemistry Recent Labs  Lab 10/07/17 0401 10/10/17 0912 10/11/17 0650  NA 135 133* 134*  K 4.4 4.1 3.2*  CL 102 100* 91*  CO2 21* 21* 28  GLUCOSE 116* 115* 110*  BUN 23* 27* 22*  CREATININE 1.45* 1.36* 1.38*  CALCIUM 8.3* 8.6* 8.8*  PROT  --  6.3*  --   ALBUMIN  --  3.3*  --   AST  --  26  --   ALT  --  17  --   ALKPHOS  --  89  --   BILITOT  --  1.4*  --   GFRNONAA 45* 49* 48*  GFRAA 52* 56* 55*  ANIONGAP 12 12 15      Hematology Recent Labs  Lab 10/10/17 0912 10/11/17 0650  WBC 6.8 8.2  RBC 3.90* 4.37  HGB 12.8* 14.4  HCT 38.5* 43.1  MCV 98.7 98.6  MCH 32.8 33.0  MCHC 33.2 33.4  RDW 15.0 15.1  PLT 196 243  Cardiac Enzymes Recent Labs  Lab 10/10/17 0912  TROPONINI 0.24*   No results for input(s): TROPIPOC in the last 168 hours.   BNP Recent Labs  Lab 10/10/17 0912  BNP 3,394.4*     DDimer No results for input(s): DDIMER in the last 168 hours.   Radiology    Dg Chest 2 View  Result Date: 10/10/2017 CLINICAL DATA:  Bilateral lower extremity swelling. EXAM: CHEST - 2 VIEW COMPARISON:  Chest x-ray dated September 17, 2017. FINDINGS: Stable cardiomegaly. Diffusely increased interstitial thickening is slightly progressed when compared to prior study. Small bilateral pleural effusions. Left-greater-than-right basilar atelectasis. No consolidation or pneumothorax. No acute osseous abnormality. IMPRESSION: Worsening interstitial pulmonary edema and small bilateral pleural effusions. Electronically Signed   By: Titus Dubin M.D.   On: 10/10/2017 08:31          Assessment & Plan     Atrial Fibrillation  Recurrence  CHF acute//chronic systolic  Cardiomyopathy presumed nonischemic and rate related  Aortic Stenosis- mild    Hypokalemia  IVCD  repelete K   Still significantly volume overloaded  Will continue diuresis, encouraged slowing down on PO intake  Increase furosemide 40 q6 >> 80 q8 Renal function stable   Will continue with amio and plan DCCV on Tues once we have dealt with his volume overload  Next step would be AV ablation and pacing   Will ask re AS mean grad 11 with EF 30 year ago, with normal left ventricular function, her mean gradient was almost similar.  Would not think this would be severe enough for low gradient AS discussed with Dr. London Sheer.  He says the mean velocity ratio 0.34 with support the contention that this is not severe aortic stenosis but suggested morphological evaluation tomorrow Signed, Virl Axe, MD  10/11/2017, 1:05 PM

## 2017-10-12 DIAGNOSIS — I4891 Unspecified atrial fibrillation: Secondary | ICD-10-CM

## 2017-10-12 LAB — BASIC METABOLIC PANEL
ANION GAP: 12 (ref 5–15)
BUN: 28 mg/dL — ABNORMAL HIGH (ref 6–20)
CALCIUM: 9.2 mg/dL (ref 8.9–10.3)
CHLORIDE: 92 mmol/L — AB (ref 101–111)
CO2: 32 mmol/L (ref 22–32)
Creatinine, Ser: 1.69 mg/dL — ABNORMAL HIGH (ref 0.61–1.24)
GFR calc non Af Amer: 37 mL/min — ABNORMAL LOW (ref >60)
GFR, EST AFRICAN AMERICAN: 43 mL/min — AB (ref >60)
Glucose, Bld: 128 mg/dL — ABNORMAL HIGH (ref 65–99)
Potassium: 4.6 mmol/L (ref 3.5–5.1)
SODIUM: 136 mmol/L (ref 135–145)

## 2017-10-12 MED ORDER — FUROSEMIDE 10 MG/ML IJ SOLN: 80.0000 mg | Freq: Three times a day (TID) | INTRAMUSCULAR | Status: DC

## 2017-10-12 MED ORDER — SPIRONOLACTONE 12.5 MG HALF TABLET
12.5000 mg | ORAL_TABLET | Freq: Every day | ORAL | Status: DC
  Administered 2017-10-12 – 2017-10-13 (×2): 12.5 mg via ORAL
  Filled 2017-10-12 (×4): qty 1

## 2017-10-12 MED ORDER — ATORVASTATIN CALCIUM 40 MG PO TABS
40.0000 mg | ORAL_TABLET | Freq: Every day | ORAL | Status: DC
  Administered 2017-10-12 – 2017-10-13 (×2): 40 mg via ORAL
  Filled 2017-10-12 (×2): qty 1

## 2017-10-12 MED ORDER — POTASSIUM CHLORIDE CRYS ER 20 MEQ PO TBCR
20.0000 meq | EXTENDED_RELEASE_TABLET | Freq: Every day | ORAL | Status: DC
  Administered 2017-10-13 – 2017-10-14 (×2): 20 meq via ORAL
  Filled 2017-10-12: qty 1

## 2017-10-12 MED ORDER — FUROSEMIDE 10 MG/ML IJ SOLN
80.0000 mg | Freq: Two times a day (BID) | INTRAMUSCULAR | Status: AC
  Administered 2017-10-12 (×2): 80 mg via INTRAVENOUS
  Filled 2017-10-12 (×2): qty 8

## 2017-10-12 MED ORDER — AMIODARONE HCL 200 MG PO TABS
400.0000 mg | ORAL_TABLET | Freq: Two times a day (BID) | ORAL | Status: DC
  Administered 2017-10-12 – 2017-10-14 (×4): 400 mg via ORAL
  Filled 2017-10-12 (×4): qty 2

## 2017-10-12 NOTE — Plan of Care (Signed)
  Problem: Education: Goal: Knowledge of General Education information will improve Outcome: Progressing   Problem: Health Behavior/Discharge Planning: Goal: Ability to manage health-related needs will improve Outcome: Progressing   Problem: Clinical Measurements: Goal: Ability to maintain clinical measurements within normal limits will improve Outcome: Progressing Goal: Will remain free from infection Outcome: Progressing Goal: Diagnostic test results will improve Outcome: Progressing Goal: Respiratory complications will improve Outcome: Progressing Goal: Cardiovascular complication will be avoided Outcome: Progressing   Problem: Activity: Goal: Risk for activity intolerance will decrease Outcome: Progressing   Problem: Nutrition: Goal: Adequate nutrition will be maintained Outcome: Progressing   Problem: Coping: Goal: Level of anxiety will decrease Outcome: Progressing   Problem: Elimination: Goal: Will not experience complications related to bowel motility Outcome: Progressing Goal: Will not experience complications related to urinary retention Outcome: Progressing   Problem: Pain Managment: Goal: General experience of comfort will improve Outcome: Progressing   Problem: Safety: Goal: Ability to remain free from injury will improve Outcome: Progressing   Problem: Skin Integrity: Goal: Risk for impaired skin integrity will decrease Outcome: Progressing   Problem: Education: Goal: Ability to demonstrate management of disease process will improve Outcome: Progressing Goal: Ability to verbalize understanding of medication therapies will improve Outcome: Progressing   Problem: Activity: Goal: Capacity to carry out activities will improve Outcome: Progressing   Problem: Cardiac: Goal: Ability to achieve and maintain adequate cardiopulmonary perfusion will improve Outcome: Progressing   

## 2017-10-12 NOTE — Consult Note (Addendum)
Advanced Heart Failure Team Consult Note   Primary Physician: [name], MD PCP-Cardiologist:  Virl Axe, MD  Reason for Consultation: A/C CHF  HPI:    Travis [name] is seen today for evaluation of A/C systolic CHF at the request of [name].   Travis [name] is a 77 y.o. 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   Pt failed DCCV 3/22, 4/8, and 09/17/17. Failed Tikosyn. Did have conversion with Amiodarone and DCCV. Pt underwent successful ablation 10/07/17  After discharge from 10/07/17, pt noted progressive worsening of peripheral edema despite compliance with his medications and salt restriction. Felt he went into Afib 10/09/17 with HR in 100-120s leading to ER presentation. Pertinent labs on admission include Cr 1.36, Trop 0.24, Hgb 12.8. CXR showed worsening pulmonary edema. Given IV lasix with good response. EP following and plan DCCV 10/13/17 with consideration for AV node ablation with BiV pacing should he fail.   Feeling much better today. PTA was very active, working out with a trainer 3 x a week. He lives alone and has no problem taking his medications or preparing meals. Trys to watch sodium and fluid intake. Denies SOB, Orthopnea, or PND at baseline. Did notice edema starting to build up prior to ablation last week. Takes lasix at home with mild response. He does occasionally lightheaded with rapid standing. He doesn't remember ever having a cardiac cath or anyone ever commenting on his "arteries"  He has diuresed very well on IV lasix. Down 15 lbs total.   Echo 08/13/2017 LVEF 30-35%, Mod AS, Mild AI, Severe LAE, RV mild dilated and mild/mod reduced, PA peak pressure 46 mm Hg.  Review of systems complete and found to be negative unless listed in HPI.    Home Medications Prior to Admission  medications   Medication Sig Start Date End Date Taking? Authorizing Provider  acetaminophen (TYLENOL) 500 MG tablet Take 1,000 mg by mouth every 6 (six) hours as needed for mild pain or headache.    Yes [provider]  amiodarone (PACERONE) 200 MG tablet Take 1 tablet (200 mg total) by mouth daily. 10/07/17  Yes Seiler, Amber K, NP  atorvastatin (LIPITOR) 40 MG tablet Take 1 tablet (40 mg total) by mouth once a week. Patient taking differently: Take 40 mg by mouth every Sunday.  07/31/16  Yes [name], MD  carvedilol (COREG) 12.5 MG tablet Take 1 tablet (12.5 mg total) by mouth 2 (two) times daily with a meal. 09/21/17  Yes Kathyrn Drown D, NP  finasteride (PROPECIA) 1 MG tablet TAKE 1 TABLET(1 MG) BY MOUTH DAILY 10/05/17  Yes [name], MD  furosemide (LASIX) 40 MG tablet Take 1 tablet (40 mg total) by mouth daily. 08/17/17  Yes Bhagat, Bhavinkumar, PA  hydroxypropyl methylcellulose / hypromellose (ISOPTO TEARS / GONIOVISC) 2.5 % ophthalmic solution Place 2 drops into both eyes 3 (three) times daily as needed for dry eyes.   Yes [provider]  meloxicam (MOBIC) 15 MG tablet TAKE 1 TABLET BY MOUTH DAILY 10/07/17  Yes [name], MD  omeprazole (PRILOSEC) 20 MG capsule TAKE 1 CAPSULE BY MOUTH DAILY Patient taking differently: Take 20 mg by mouth once a day 08/03/17  Yes Milus Banister, MD  potassium chloride SA (K-DUR,KLOR-CON) 20 MEQ tablet Take 2 tablets (40  mEq total) by mouth daily. 09/22/17  Yes Kathyrn Drown D, NP  Rivaroxaban (XARELTO) 15 MG TABS tablet Take 1 tablet (15 mg total) by mouth daily with supper. 09/21/17  Yes Kathyrn Drown D, NP  temazepam (RESTORIL) 15 MG capsule TAKE ONE CAPSULE BY MOUTH AT BEDTIME AS NEEDED Patient taking differently: Take 15 mg by mouth at bedtime as needed for sleep 07/27/17  Yes [name], MD  ALPRAZolam Duanne Moron) 0.5 MG tablet TAKE 1 TABLET BY MOUTH TWICE DAILY AS NEEDED FOR ANXIETY( SEPARATE BY 8 HOURS FROM  TEMAZEPAM) 09/25/17   [name], MD    Past Medical History: Past Medical History:  Diagnosis Date  . Acute kidney injury ([place])   . Arthritis   . Bicuspid aortic valve   . Cardiomyopathy -resolved    tachycardia-induced, EF 55% June 2009  . CHF (congestive heart failure) ([place])    JULY 2014  . GERD (gastroesophageal reflux disease)   . History of cardioversion 12/16/2012  . History of stomach ulcers   . Hypertension   . Left bundle branch block   . Persistent atrial fibrillation ([place])    multiple prior cardioversion  . Sinus bradycardia   . Status post clamping of cerebral aneurysm 79    Past Surgical History: Past Surgical History:  Procedure Laterality Date  . ATRIAL FIBRILLATION ABLATION N/A 10/06/2017   Procedure: ATRIAL FIBRILLATION ABLATION;  Surgeon: [name], MD;  Location: [place] [facility];  Service: Cardiovascular;  Laterality: N/A;  . CARDIAC CATHETERIZATION    . CARDIOVERSION N/A 12/16/2012   Procedure: CARDIOVERSION;  Surgeon: Lelon Perla, MD;  Location: [facility] ENDOSCOPY;  Service: Cardiovascular;  Laterality: N/A;  . CARDIOVERSION N/A 08/14/2017   Procedure: CARDIOVERSION;  Surgeon: Josue Hector, MD;  Location: [facility] ENDOSCOPY;  Service: Cardiovascular;  Laterality: N/A;  . CARDIOVERSION N/A 09/21/2017   Procedure: CARDIOVERSION;  Surgeon: Sanda Klein, MD;  Location: [facility] ENDOSCOPY;  Service: Cardiovascular;  Laterality: N/A;  . CATARACT EXTRACTION  05/2015   bilateral  . cerebral anuersym post clips    . fractured left arm    . HERNIA REPAIR     lft  . INGUINAL HERNIA REPAIR  07/02/2011   Procedure: LAPAROSCOPIC INGUINAL HERNIA;  Surgeon: Harl Bowie, MD;  Location: [facility];  Service: General;  Laterality: Left;  Laparoscopic left inguinal hernia repair and mesh  . left tendon repair     lft foot  . ROTATOR CUFF REPAIR     lf  . TEE WITHOUT CARDIOVERSION N/A 08/14/2017   Procedure: TRANSESOPHAGEAL ECHOCARDIOGRAM (TEE);  Surgeon: Josue Hector, MD;  Location: [facility] ENDOSCOPY;  Service: Cardiovascular;  Laterality: N/A;  . TONSILLECTOMY    . TOTAL KNEE ARTHROPLASTY Bilateral 02/06/2014   Procedure: TOTAL KNEE BILATERAL;  Surgeon: Mauri Pole, MD;  Location: [facility] ORS;  Service: Orthopedics;  Laterality: Bilateral;  . WRIST GANGLION EXCISION     lft    Family History: Family History  Problem Relation Age of Onset  . Stroke Father 96       smoker  . Lung cancer Mother 26       former smoker  . Stroke Mother   . Stroke Brother   . Colon cancer Neg Hx     Social History: Social History   Socioeconomic History  . Marital status: Single    Spouse name: Not on file  . Number of children: 0  . Years of education: Not on file  . Highest education  level: Not on file  Occupational History  . Occupation: Retired  Scientific laboratory technician  . Financial resource strain: Not on file  . Food insecurity:    Worry: Not on file    Inability: Not on file  . Transportation needs:    Medical: Not on file    Non-medical: Not on file  Tobacco Use  . Smoking status: Former Smoker    Packs/day: 1.50    Years: 25.00    Pack years: 37.50    Last attempt to quit: 05/26/1986    Years since quitting: 31.4  . Smokeless tobacco: Never Used  Substance and Sexual Activity  . Alcohol use: Yes    Alcohol/week: 0.5 oz    Types: 1 Standard drinks or equivalent per week    Comment: stopped drinking   . Drug use: No  . Sexual activity: Not Currently  Lifestyle  . Physical activity:    Days per week: Not on file    Minutes per session: Not on file  . Stress: Not on file  Relationships  . Social connections:    Talks on phone: Not on file    Gets together: Not on file    Attends religious service: Not on file    Active member of club or organization: Not on file    Attends meetings of clubs or organizations: Not on file    Relationship status: Not on file  Other Topics Concern  . Not on file  Social History Narrative   Homosexual. Lives alone.  Not sexually active.       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)      Hobbies: bridge, formerly tennis hoping to get back, walking    Allergies:  No Known Allergies  Objective:    Vital Signs:   Temp:  [97.6 F (36.4 C)-98.8 F (37.1 C)] 97.7 F (36.5 C) (05/20 0346) Pulse Rate:  [66-98] 98 (05/20 0346) Resp:  [18] 18 (05/20 0346) BP: (103-119)/(49-80) 106/79 (05/20 0346) SpO2:  [92 %-100 %] 97 % (05/20 0346) Weight:  [164 lb 12.8 oz (74.8 kg)] 164 lb 12.8 oz (74.8 kg) (05/20 0349) Last BM Date: 10/11/17  Weight change: Filed Weights   10/10/17 1600 10/11/17 0512 10/12/17 0349  Weight: 179 lb 12.8 oz (81.6 kg) 170 lb 4.8 oz (77.2 kg) 164 lb 12.8 oz (74.8 kg)    Intake/Output:   Intake/Output Summary (Last 24 hours) at 10/12/2017 1012 Last data filed at 10/12/2017 0930 Gross per 24 hour  Intake 600 ml  Output 3200 ml  Net -2600 ml    Physical Exam    General:  Elderly, but appearing. No resp difficulty HEENT: normal Neck: supple. JVP 8-9 cm. Carotids 2+ bilat; no bruits. No lymphadenopathy or thyromegaly appreciated. Cor: PMI nondisplaced. Irregularly irregular. 2/6 AS.  Lungs: Clear Abdomen: soft, nontender, nondistended. No hepatosplenomegaly. No bruits or masses. Good bowel sounds. Extremities: no cyanosis, clubbing, or rash. Trace to 1+ dependent BLE edema. Neuro: alert & orientedx3, cranial nerves grossly intact. moves all 4 extremities w/o difficulty. Affect pleasant   Telemetry   Afib 90-100s, personally reviewed.   EKG    10/11/17 Afib 100, personally reviewed  Labs   Basic Metabolic Panel: Recent Labs  Lab 10/06/17 0931 10/07/17 0401 10/10/17 0912 10/11/17 0650 10/12/17 0720  NA 135 135 133* 134* 136  K 4.4 4.4 4.1 3.2* 4.6  CL 101 102 100* 91* 92*  CO2 21* 21* 21* 28 32  GLUCOSE 116* 116*  115* 110* 128*  BUN 26* 23* 27* 22* 28*  CREATININE 1.67* 1.45* 1.36* 1.38* 1.69*  CALCIUM 8.7* 8.3* 8.6* 8.8* 9.2  MG   --   --  1.9  --   --     Liver Function Tests: Recent Labs  Lab 10/10/17 0912  AST 26  ALT 17  ALKPHOS 89  BILITOT 1.4*  PROT 6.3*  ALBUMIN 3.3*   No results for input(s): LIPASE, AMYLASE in the last 168 hours. No results for input(s): AMMONIA in the last 168 hours.  CBC: Recent Labs  Lab 10/10/17 0912 10/11/17 0650  WBC 6.8 8.2  NEUTROABS 4.9  --   HGB 12.8* 14.4  HCT 38.5* 43.1  MCV 98.7 98.6  PLT 196 243    Cardiac Enzymes: Recent Labs  Lab 10/10/17 0912  TROPONINI 0.24*    BNP: BNP (last 3 results) Recent Labs    08/12/17 1531 09/17/17 1313 10/10/17 0912  BNP 649.3* 1,399.3* 3,394.4*    ProBNP (last 3 results) No results for input(s): PROBNP in the last 8760 hours.   CBG: No results for input(s): GLUCAP in the last 168 hours.  Coagulation Studies: No results for input(s): LABPROT, INR in the last 72 hours.   Imaging    No results found.   Medications:     Current Medications: . amiodarone  400 mg Oral BID  . carvedilol  12.5 mg Oral BID WC  . furosemide  80 mg Intravenous Q8H  . meloxicam  15 mg Oral Daily  . pantoprazole  40 mg Oral Daily  . potassium chloride SA  40 mEq Oral Daily  . Rivaroxaban  15 mg Oral Q supper     Infusions:     Patient Profile   SENECA HOBACK is a 77 y.o. 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   Pt failed DCCV 3/22, 4/8, and 09/17/17. Failed Tikosyn. Did have conversion with Amiodarone and DCCV. Pt underwent successful ablation 10/07/17, but only held for 2-3 days. Admitted with Afib RVR. Plan DCCV, then consideration for AV node ablation with BiV pacing.   Assessment/Plan   1. Persistent Afib - Despite multiple DCCVs and now ablation.  - Rates improved into 90s on increased po amiodarone to 400 mg BID - Per EP, plan for  DCCV tomorrow, then consideration of AV node ablation with BiV pacing.  - Continue Xarelto 15 mg daily.  - CHA2DS2/VASc is at least 5 (Age, CHF, HTN, and CAD by Cor CT.)   2. Acute on chronic systolic CHF - Echo 06/25/8655 LVEF 30-35%, Mod AS, Mild AI, Severe LAE, RV mild dilated and mild/mod reduced, PA peak pressure 46 mm Hg. - Volume status remains at least mildly elevated.  - Continue IV lasix 80 mg TID today, and assess response in am. Watch Cr closely.  - Continue coreg 12.5 mg BID - Start spiro 12.5 mg daily.  - Functional status NYHA I.  - CT cardiac morphology 10/01/17 with Calcium Score of 686 (64th percentile for age/sex) and moderate diffuse calcifications or the coronary arteries.  - Presumed tachycardia mediated CMP. Will need Echo after 3 months of NSR.  3. Hypokalemia - Goal K > 4.0 and Mg > 2.0  - Supp as needed.   4. AKI - Creatinine 1.38 -> 1.69 - Follow closely with diuresis.  Medication concerns reviewed with patient and pharmacy team. Barriers identified: None at this time.   Length of Stay: 2  Annamaria Helling  10/12/2017, 10:12 AM  Advanced Heart Failure Team Pager [phone] (M-F; 7a - 4p)  Please contact [facility] for night-coverage after hours (4p -7a ) and weekends on [email or web address]  Patient seen with PA, agree with the above note.   He was admitted with recurrent atrial fibrillation and volume overload.  He has been on amiodarone, which has been increased to 400 mg bid.  He had recent atrial fibrillation ablation.  He has diuresed reasonably in the hospital and weight is down.  Plan noted for DCCV tomorrow.   On exam, JVP 10 cm with HJR.  Irregular S1S2, 2/6 SEM RUSB with clear S2. 1+ edema 1/2 to knees bilaterally.  Mildly decreased breath sounds at bases.   1. Acute on chronic systolic CHF: Echo 2/20 with EF 30-35%, mild to moderately decreased RV systolic function.  Cause of cardiomyopathy uncertain.  It is possible that this is a  tachycardia-mediated or afib-mediated cardiomyopathy given poor control of atrial fibrillation.  On exam, he remains volume overloaded though he has been diuresing here. Creatinine up to 1.69 today.  - I would try to continue Lasix at 80 mg IV every 8 hrs today and reassess volume status tomorrow.  - Continue Coreg 12.5 mg bid.  - Add spironolactone 12.5 daily.  - No ARB/Entresto yet with elevated creatinine.  - Will need to get back into NSR or ablate/pace (see below).  Would repeat echo while in NSR or after ablation/pacing, and if EF still remains low, he will need LHC/RHC to assess for coronary disease as cause of cardiomyopathy.  2. Atrial fibrillation: Persistent, difficult to control rate and rhythm.  He failed Tikosyn.  He had atrial fibrillation ablation earlier this month.  He is now on amiodarone 400 mg bid.  - Will plan DCCV tomorrow, hopefully DCCV in conjunction with recent ablation will allow him to hold NSR.  - Continue Xarelto and Coreg.  - If he does not convert or converts and goes back into atrial fibrillation quickly, patient will need AV nodal ablation with BiV pacing to manage this.  3. CAD: Extensive coronary calcification on recent CT chest.  No chest pain.  Mild troponin rise this admission likely due to demand ischemia with volume overload and afib/RVR.  - As above, if EF does not improve with NSR or ablation/BiV pacing, he will need coronary angiography to rule out CAD as cause of cardiomyopathy.   - Add statin.  4. CKD: Stage 3.  Creatinine to 1.69 today, was 1.6 a couple days ago.  For now, would continue diuresis but follow closely.  5. Aortic stenosis: Moderate on last echo with bicuspid valve.   [name] 10/12/2017 1:46 PM

## 2017-10-12 NOTE — Progress Notes (Addendum)
Electrophysiology Rounding Note  Patient Name: Travis [name] Date of Encounter: 10/12/2017  Electrophysiologist: Caryl Comes   Subjective   The patient is doing well today.  At this time, the patient denies chest pain, shortness of breath, or any new concerns. Swelling improved, but still present.   Inpatient Medications    Scheduled Meds: . amiodarone  200 mg Oral Daily  . carvedilol  12.5 mg Oral BID WC  . furosemide  80 mg Intravenous Q8H  . meloxicam  15 mg Oral Daily  . pantoprazole  40 mg Oral Daily  . potassium chloride SA  40 mEq Oral Daily  . Rivaroxaban  15 mg Oral Q supper   Continuous Infusions:  PRN Meds: acetaminophen, nitroGLYCERIN, ondansetron (ZOFRAN) IV, polyvinyl alcohol   Vital Signs    Vitals:   10/11/17 1952 10/12/17 0026 10/12/17 0346 10/12/17 0349  BP: (!) 119/49 108/80 106/79   Pulse: 85 87 98   Resp:   18   Temp: 98.8 F (37.1 C)  97.7 F (36.5 C)   TempSrc: Oral  Oral   SpO2: 100% 92% 97%   Weight:    164 lb 12.8 oz (74.8 kg)  Height:        Intake/Output Summary (Last 24 hours) at 10/12/2017 0752 Last data filed at 10/12/2017 0631 Gross per 24 hour  Intake 1080 ml  Output 3000 ml  Net -1920 ml   Filed Weights   10/10/17 1600 10/11/17 0512 10/12/17 0349  Weight: 179 lb 12.8 oz (81.6 kg) 170 lb 4.8 oz (77.2 kg) 164 lb 12.8 oz (74.8 kg)    Physical Exam    GEN- The patient is elderly appearing, alert and oriented x 3 today.   Head- normocephalic, atraumatic Eyes-  Sclera clear, conjunctiva pink Ears- hearing intact Oropharynx- clear Neck- supple Lungs- Clear to ausculation bilaterally, normal work of breathing Heart- Irregular rate and rhythm, no murmurs, rubs or gallops GI- soft, NT, ND, + BS Extremities- no clubbing, cyanosis, 1+ BLE edema to knees Skin- no rash or lesion Psych- euthymic mood, full affect Neuro- strength and sensation are intact  Labs    CBC Recent Labs    10/10/17 0912 10/11/17 0650  WBC 6.8 8.2    NEUTROABS 4.9  --   HGB 12.8* 14.4  HCT 38.5* 43.1  MCV 98.7 98.6  PLT 196 628   Basic Metabolic Panel Recent Labs    10/10/17 0912 10/11/17 0650  NA 133* 134*  K 4.1 3.2*  CL 100* 91*  CO2 21* 28  GLUCOSE 115* 110*  BUN 27* 22*  CREATININE 1.36* 1.38*  CALCIUM 8.6* 8.8*  MG 1.9  --    Liver Function Tests Recent Labs    10/10/17 0912  AST 26  ALT 17  ALKPHOS 89  BILITOT 1.4*  PROT 6.3*  ALBUMIN 3.3*   No results for input(s): LIPASE, AMYLASE in the last 72 hours. Cardiac Enzymes Recent Labs    10/10/17 0912  TROPONINI 0.24*   BNP Invalid input(s): POCBNP D-Dimer No results for input(s): DDIMER in the last 72 hours. Hemoglobin A1C No results for input(s): HGBA1C in the last 72 hours. Fasting Lipid Panel No results for input(s): CHOL, HDL, LDLCALC, TRIG, CHOLHDL, LDLDIRECT in the last 72 hours. Thyroid Function Tests No results for input(s): TSH, T4TOTAL, T3FREE, THYROIDAB in the last 72 hours.  Invalid input(s): FREET3  Telemetry    AF (personally reviewed)  Radiology    Dg Chest 2 View  Result Date: 10/10/2017  CLINICAL DATA:  Bilateral lower extremity swelling. EXAM: CHEST - 2 VIEW COMPARISON:  Chest x-ray dated September 17, 2017. FINDINGS: Stable cardiomegaly. Diffusely increased interstitial thickening is slightly progressed when compared to prior study. Small bilateral pleural effusions. Left-greater-than-right basilar atelectasis. No consolidation or pneumothorax. No acute osseous abnormality. IMPRESSION: Worsening interstitial pulmonary edema and small bilateral pleural effusions. Electronically Signed   By: Titus Dubin M.D.   On: 10/10/2017 08:31     Patient Profile     Travis [name] is a 77 y.o. male with a past medical history significant for persistent atrial fibrillation (s/p PVI 6/38/46), chronic systolic heart failure (felt to be tachycardia mediated), hypertension.  He was admitted for significant LE edema and found to be in recurrent  AF.   Assessment & Plan    1.  Persistent atrial fibrillation S/p PVI 10/06/17. Recurrent AF in first 3 months post ablation is typical - would continue amiodarone and cardiovert as necessary to maintain SR in the healing period.  RA was found to be very large during ablation making long term success rates lower. Can consider AVN ablation and pacing down the road, but would give PVI 3 months first to see if we can maintain SR post healing phase.  Plan for DCCV tomorrow - NPO after midnight tonight Continue [place]   2.  Acute on chronic systolic heart failure Continue IV lasix for today (last dose scheduled at [facility]) BMET pending this morning Continue BB, not currently on ACE-I - will discuss with Dr Caryl Comes I think he would benefit from AHF inpatient consultation - will ask their team to see to help manage  3.  Presumed tachycardia mediated cardiomyopathy Repeat echo after 3 months of SR  4.  Hypokalemia BMET pending this morning   Signed, Chanetta Marshall, NP  10/12/2017, 7:52 AM    Renal function stable would continue IV diuresis  Anticipate 24-48 hrs DCCV in am  Hopefully his cardiomyoathy is rate related with rapid change over last 12 months and with sinus or AV ablation and pacing will recover

## 2017-10-13 ENCOUNTER — Other Ambulatory Visit: Payer: Self-pay

## 2017-10-13 ENCOUNTER — Encounter (HOSPITAL_COMMUNITY)
Admission: EM | Disposition: A | Discharge status: Home / Self Care | Payer: Self-pay | Source: Home / Self Care | Attending: Internal Medicine

## 2017-10-13 ENCOUNTER — Inpatient Hospital Stay (HOSPITAL_COMMUNITY): Payer: Medicare Other | Admitting: Anesthesiology

## 2017-10-13 ENCOUNTER — Encounter (HOSPITAL_COMMUNITY): Payer: Self-pay | Admitting: *Deleted

## 2017-10-13 HISTORY — PX: CARDIOVERSION: SHX1299

## 2017-10-13 LAB — BASIC METABOLIC PANEL
ANION GAP: 12 (ref 5–15)
BUN: 28 mg/dL — ABNORMAL HIGH (ref 6–20)
CALCIUM: 9 mg/dL (ref 8.9–10.3)
CO2: 33 mmol/L — AB (ref 22–32)
Chloride: 93 mmol/L — ABNORMAL LOW (ref 101–111)
Creatinine, Ser: 1.68 mg/dL — ABNORMAL HIGH (ref 0.61–1.24)
GFR calc non Af Amer: 38 mL/min — ABNORMAL LOW (ref >60)
GFR, EST AFRICAN AMERICAN: 44 mL/min — AB (ref >60)
Glucose, Bld: 115 mg/dL — ABNORMAL HIGH (ref 65–99)
POTASSIUM: 4 mmol/L (ref 3.5–5.1)
Sodium: 138 mmol/L (ref 135–145)

## 2017-10-13 LAB — MAGNESIUM: Magnesium: 1.9 mg/dL (ref 1.7–2.4)

## 2017-10-13 SURGERY — CARDIOVERSION
Anesthesia: General

## 2017-10-13 MED ORDER — TORSEMIDE 20 MG PO TABS
40.0000 mg | ORAL_TABLET | Freq: Every day | ORAL | Status: DC
  Administered 2017-10-13 – 2017-10-14 (×2): 40 mg via ORAL
  Filled 2017-10-13: qty 2

## 2017-10-13 MED ORDER — PROPOFOL 10 MG/ML IV BOLUS
INTRAVENOUS | Status: DC | PRN
  Administered 2017-10-13: 80 mg via INTRAVENOUS

## 2017-10-13 MED ORDER — MAGNESIUM SULFATE 2 GM/50ML IV SOLN
2.0000 g | Freq: Once | INTRAVENOUS | Status: AC
  Administered 2017-10-13: 2 g via INTRAVENOUS
  Filled 2017-10-13 (×2): qty 50

## 2017-10-13 MED ORDER — SODIUM CHLORIDE 0.9 % IV SOLN
INTRAVENOUS | Status: DC | PRN
  Administered 2017-10-13: 12:00:00 via INTRAVENOUS

## 2017-10-13 MED ORDER — LIDOCAINE HCL (CARDIAC) PF 100 MG/5ML IV SOSY
PREFILLED_SYRINGE | INTRAVENOUS | Status: DC | PRN
  Administered 2017-10-13: 60 mg via INTRATRACHEAL

## 2017-10-13 NOTE — Transfer of Care (Signed)
Immediate Anesthesia Transfer of Care Note  Patient: Travis [name]  Procedure(s) Performed: CARDIOVERSION (N/A )  Patient Location: Endoscopy Unit  Anesthesia Type:General  Level of Consciousness: awake, alert  and oriented  Airway & Oxygen Therapy: Patient Spontanous Breathing and Patient connected to nasal cannula oxygen  Post-op Assessment: Report given to RN, Post -op Vital signs reviewed and stable and Patient moving all extremities X 4  Post vital signs: Reviewed and stable  Last Vitals:  Vitals Value Taken Time  BP    Temp    Pulse 49 10/13/2017  1:32 PM  Resp 19 10/13/2017  1:32 PM  SpO2 100 % 10/13/2017  1:32 PM  Vitals shown include unvalidated device data.  Last Pain:  Vitals:   10/13/17 1225  TempSrc: Axillary  PainSc: 0-No pain         Complications: No apparent anesthesia complications

## 2017-10-13 NOTE — Consult Note (Signed)
   [facility] CM Inpatient Consult   10/13/2017  Travis [name] 07/06/1940 [id number]  Patient screened for readmission [facility]. MD notes from History and Physical reveals [name] is a 77 y.o. male with a history of persistent atrial fibrillation ( multiple failed cardioversion this year, failed Tikosyn and failed ablation last week), by cuspid aortic valve with moderate aortic stenosis and mild aortic insufficiency, chronic systolic heart failure with EF of 30 to 35%, medication adjustments.     Patient is in the [facility] services under patient's Medicare plan.  Patient is independent and lives alone.   Patient is currently of the unit.   Please place a [facility] Care Management consult or for questions contact:   Natividad Brood, RN BSN [facility] Liaison  [phone] business mobile phone Toll free office [phone]

## 2017-10-13 NOTE — Progress Notes (Signed)
Patient ID: Travis [name], male   DOB: 1940-08-26, 77 y.o.   MRN: [id number]     Advanced Heart Failure Rounding Note  PCP-Cardiologist: Travis Axe, MD   Subjective:    Patient remains in atrial fibrillation today, rate controlled.  He diuresed well yesterday, weight down.    Objective:   Weight Range: 161 lb 6 oz (73.2 kg) Body mass index is 24.54 kg/m.   Vital Signs:   Temp:  [97.4 F (36.3 C)-98.2 F (36.8 C)] 97.7 F (36.5 C) (05/21 0332) Pulse Rate:  [91-104] 104 (05/21 0332) Resp:  [18] 18 (05/20 1300) BP: (95-108)/(59-82) 100/82 (05/21 0332) SpO2:  [92 %-98 %] 95 % (05/21 0332) Weight:  [161 lb 6 oz (73.2 kg)] 161 lb 6 oz (73.2 kg) (05/21 0603) Last BM Date: 10/12/17  Weight change: Filed Weights   10/11/17 0512 10/12/17 0349 10/13/17 0603  Weight: 170 lb 4.8 oz (77.2 kg) 164 lb 12.8 oz (74.8 kg) 161 lb 6 oz (73.2 kg)    Intake/Output:   Intake/Output Summary (Last 24 hours) at 10/13/2017 0823 Last data filed at 10/13/2017 0604 Gross per 24 hour  Intake 480 ml  Output 2600 ml  Net -2120 ml      Physical Exam    General:  Well appearing. No resp difficulty HEENT: Normal Neck: Supple. JVP not elevated. Carotids 2+ bilat; no bruits. No lymphadenopathy or thyromegaly appreciated. Cor: PMI nondisplaced. Irregular rate & rhythm. No rubs, gallops or murmurs. Lungs: Clear Abdomen: Soft, nontender, nondistended. No hepatosplenomegaly. No bruits or masses. Good bowel sounds. Extremities: No cyanosis, clubbing, rash.  Trace ankle edema.  Neuro: Alert & orientedx3, cranial nerves grossly intact. moves all 4 extremities w/o difficulty. Affect pleasant   Telemetry   Afib in 80s, personally reviewed.   Labs    CBC Recent Labs    10/10/17 0912 10/11/17 0650  WBC 6.8 8.2  NEUTROABS 4.9  --   HGB 12.8* 14.4  HCT 38.5* 43.1  MCV 98.7 98.6  PLT 196 601   Basic Metabolic Panel Recent Labs    10/10/17 0912  10/12/17 0720 10/13/17 0512  NA 133*   < >  136 138  K 4.1   < > 4.6 4.0  CL 100*   < > 92* 93*  CO2 21*   < > 32 33*  GLUCOSE 115*   < > 128* 115*  BUN 27*   < > 28* 28*  CREATININE 1.36*   < > 1.69* 1.68*  CALCIUM 8.6*   < > 9.2 9.0  MG 1.9  --   --  1.9   < > = values in this interval not displayed.   Liver Function Tests Recent Labs    10/10/17 0912  AST 26  ALT 17  ALKPHOS 89  BILITOT 1.4*  PROT 6.3*  ALBUMIN 3.3*   No results for input(s): LIPASE, AMYLASE in the last 72 hours. Cardiac Enzymes Recent Labs    10/10/17 0912  TROPONINI 0.24*    BNP: BNP (last 3 results) Recent Labs    08/12/17 1531 09/17/17 1313 10/10/17 0912  BNP 649.3* 1,399.3* 3,394.4*    ProBNP (last 3 results) No results for input(s): PROBNP in the last 8760 hours.   D-Dimer No results for input(s): DDIMER in the last 72 hours. Hemoglobin A1C No results for input(s): HGBA1C in the last 72 hours. Fasting Lipid Panel No results for input(s): CHOL, HDL, LDLCALC, TRIG, CHOLHDL, LDLDIRECT in the last 72 hours. Thyroid Function Tests  No results for input(s): TSH, T4TOTAL, T3FREE, THYROIDAB in the last 72 hours.  Invalid input(s): FREET3  Other results:   Imaging     No results found.   Medications:     Scheduled Medications: . amiodarone  400 mg Oral BID  . atorvastatin  40 mg Oral q1800  . carvedilol  12.5 mg Oral BID WC  . pantoprazole  40 mg Oral Daily  . potassium chloride SA  20 mEq Oral Daily  . Rivaroxaban  15 mg Oral Q supper  . spironolactone  12.5 mg Oral QHS     Infusions: . magnesium sulfate 1 - 4 g bolus IVPB       PRN Medications:  acetaminophen, nitroGLYCERIN, ondansetron (ZOFRAN) IV, polyvinyl alcohol   Assessment/Plan   1. Acute on chronic systolic CHF: Echo 3/00 with EF 30-35%, mild to moderately decreased RV systolic function.  Cause of cardiomyopathy uncertain.  It is possible that this is a tachycardia-mediated or afib-mediated cardiomyopathy given poor control of atrial  fibrillation.  On exam, volume status improved.  Weight down again. Creatinine stable at 1.68 today.  - Transition to torsemide 40 mg daily.  - Continue Coreg 12.5 mg bid.  - Continue spironolactone 12.5 daily.  - BP soft today.  If stable tomorrow, will attempt to add low dose losartan.  - Will need to get back into NSR or eventually ablate/pace (see below).  Would repeat echo while in NSR or after ablation/pacing, and if EF still remains low, he will need LHC/RHC to assess for coronary disease as cause of cardiomyopathy.  2. Atrial fibrillation: Persistent, difficult to control rate and rhythm.  He failed Tikosyn.  He had atrial fibrillation ablation earlier this month.  He is now on amiodarone 400 mg bid.  - Will plan DCCV today, hopefully DCCV in conjunction with recent ablation will allow him to hold NSR.  - Continue Xarelto and Coreg.  - If he fails to maintain NSR despite amiodarone and DCCV a few months post-PVI, will need AV nodal ablation/BiV pacing.  3. CAD: Extensive coronary calcification on recent CT chest.  No chest pain.  Mild troponin rise this admission likely due to demand ischemia with volume overload and afib/RVR.  - As above, if EF does not improve with NSR or ablation/BiV pacing, he will need coronary angiography eventually to rule out CAD as cause of cardiomyopathy.   - Added statin.  4. CKD: Stage 3.  Creatinine stable today.  5. Aortic stenosis: Moderate on last echo with bicuspid valve.   Length of Stay: 3  Travis Champagne, MD  10/13/2017, 8:23 AM  Advanced Heart Failure Team Pager [phone] (M-F; 7a - 4p)  Please contact [facility] for night-coverage after hours (4p -7a ) and weekends on [email or web address]

## 2017-10-13 NOTE — Plan of Care (Signed)
  Problem: Education: Goal: Knowledge of General Education information will improve Outcome: Progressing   Problem: Health Behavior/Discharge Planning: Goal: Ability to manage health-related needs will improve Outcome: Progressing   Problem: Clinical Measurements: Goal: Ability to maintain clinical measurements within normal limits will improve Outcome: Progressing Goal: Will remain free from infection Outcome: Progressing Goal: Diagnostic test results will improve Outcome: Progressing Goal: Respiratory complications will improve Outcome: Progressing Goal: Cardiovascular complication will be avoided Outcome: Progressing   Problem: Activity: Goal: Risk for activity intolerance will decrease Outcome: Progressing   Problem: Nutrition: Goal: Adequate nutrition will be maintained Outcome: Progressing   Problem: Coping: Goal: Level of anxiety will decrease Outcome: Progressing   Problem: Elimination: Goal: Will not experience complications related to bowel motility Outcome: Progressing Goal: Will not experience complications related to urinary retention Outcome: Progressing   Problem: Pain Managment: Goal: General experience of comfort will improve Outcome: Progressing   Problem: Safety: Goal: Ability to remain free from injury will improve Outcome: Progressing   Problem: Skin Integrity: Goal: Risk for impaired skin integrity will decrease Outcome: Progressing   Problem: Education: Goal: Ability to demonstrate management of disease process will improve Outcome: Progressing Goal: Ability to verbalize understanding of medication therapies will improve Outcome: Progressing   Problem: Activity: Goal: Capacity to carry out activities will improve Outcome: Progressing   Problem: Cardiac: Goal: Ability to achieve and maintain adequate cardiopulmonary perfusion will improve Outcome: Progressing   

## 2017-10-13 NOTE — Anesthesia Preprocedure Evaluation (Addendum)
Anesthesia Evaluation  Patient identified by MRN, date of birth, ID band Patient awake    Reviewed: Allergy & Precautions, H&P , NPO status , Patient's Chart, lab work & pertinent test results  Airway Mallampati: II   Neck ROM: full    Dental  (+) Dental Advidsory Given   Pulmonary former smoker,    breath sounds clear to auscultation       Cardiovascular hypertension, +CHF  + dysrhythmias Atrial Fibrillation  Rhythm:regular Rate:Normal  Bicuspid aortic valve   Neuro/Psych Anxiety    GI/Hepatic GERD  ,  Endo/Other    Renal/GU Renal InsufficiencyRenal disease     Musculoskeletal  (+) Arthritis ,   Abdominal   Peds  Hematology   Anesthesia Other Findings   Reproductive/Obstetrics                            Anesthesia Physical Anesthesia Plan  ASA: III  Anesthesia Plan: MAC   Post-op Pain Management:    Induction: Intravenous  PONV Risk Score and Plan: 1 and Ondansetron, Propofol infusion and Treatment may vary due to age or medical condition  Airway Management Planned: Nasal Cannula  Additional Equipment:   Intra-op Plan:   Post-operative Plan:   Informed Consent: I have reviewed the patients History and Physical, chart, labs and discussed the procedure including the risks, benefits and alternatives for the proposed anesthesia with the patient or authorized representative who has indicated his/her understanding and acceptance.   Dental Advisory Given  Plan Discussed with: Anesthesiologist, CRNA and Surgeon  Anesthesia Plan Comments:        Anesthesia Quick Evaluation

## 2017-10-13 NOTE — CV Procedure (Signed)
[place]: NPO No missed doses of xarelto  DCC x 1 150 J biphasic Converted from afib rate 115 to NSR rate 78 No immediate neurologic sequelae  Jenkins Rouge

## 2017-10-13 NOTE — Progress Notes (Addendum)
Electrophysiology Rounding Note  Patient Name: Travis [name] Date of Encounter: 10/13/2017  Primary Cardiologist: Aundra Dubin Electrophysiologist: Caryl Comes   Subjective   The patient is doing well today.  At this time, the patient denies chest pain, shortness of breath, or any new concerns.  Inpatient Medications    Scheduled Meds: . amiodarone  400 mg Oral BID  . atorvastatin  40 mg Oral q1800  . carvedilol  12.5 mg Oral BID WC  . pantoprazole  40 mg Oral Daily  . potassium chloride SA  20 mEq Oral Daily  . Rivaroxaban  15 mg Oral Q supper  . spironolactone  12.5 mg Oral QHS   Continuous Infusions:  PRN Meds: acetaminophen, nitroGLYCERIN, ondansetron (ZOFRAN) IV, polyvinyl alcohol   Vital Signs    Vitals:   10/12/17 1300 10/12/17 2112 10/13/17 0332 10/13/17 0603  BP: 108/76 (!) 95/59 100/82   Pulse: 97 91 (!) 104   Resp: 18     Temp: 98.2 F (36.8 C) (!) 97.4 F (36.3 C) 97.7 F (36.5 C)   TempSrc: Oral Oral Oral   SpO2: 98% 92% 95%   Weight:    161 lb 6 oz (73.2 kg)  Height:        Intake/Output Summary (Last 24 hours) at 10/13/2017 0704 Last data filed at 10/13/2017 0604 Gross per 24 hour  Intake 840 ml  Output 2850 ml  Net -2010 ml   Filed Weights   10/11/17 0512 10/12/17 0349 10/13/17 0603  Weight: 170 lb 4.8 oz (77.2 kg) 164 lb 12.8 oz (74.8 kg) 161 lb 6 oz (73.2 kg)    Physical Exam    GEN- The patient is elderly appearing, alert and oriented x 3 today.   Head- normocephalic, atraumatic Eyes-  Sclera clear, conjunctiva pink Ears- hearing intact Oropharynx- clear Neck- supple Lungs- Clear to ausculation bilaterally, normal work of breathing Heart- Irregular rate and rhythm GI- soft, NT, ND, + BS Extremities- no clubbing, cyanosis, +trace edema Skin- no rash or lesion Psych- euthymic mood, full affect Neuro- strength and sensation are intact  Labs    CBC Recent Labs    10/10/17 0912 10/11/17 0650  WBC 6.8 8.2  NEUTROABS 4.9  --   HGB  12.8* 14.4  HCT 38.5* 43.1  MCV 98.7 98.6  PLT 196 338   Basic Metabolic Panel Recent Labs    10/10/17 0912  10/12/17 0720 10/13/17 0512  NA 133*   < > 136 138  K 4.1   < > 4.6 4.0  CL 100*   < > 92* 93*  CO2 21*   < > 32 33*  GLUCOSE 115*   < > 128* 115*  BUN 27*   < > 28* 28*  CREATININE 1.36*   < > 1.69* 1.68*  CALCIUM 8.6*   < > 9.2 9.0  MG 1.9  --   --  1.9   < > = values in this interval not displayed.   Liver Function Tests Recent Labs    10/10/17 0912  AST 26  ALT 17  ALKPHOS 89  BILITOT 1.4*  PROT 6.3*  ALBUMIN 3.3*   No results for input(s): LIPASE, AMYLASE in the last 72 hours. Cardiac Enzymes Recent Labs    10/10/17 0912  TROPONINI 0.24*     Telemetry    Rate controlled AF (personally reviewed)  Radiology    No results found.   Patient Profile     Travis [name] is a 77 y.o. male  with a past medical history significant for persistent atrial fibrillation (s/p PVI 5/94/58), chronic systolic heart failure (felt to be tachycardia mediated), hypertension.  He was admitted for significant LE edema and found to be in recurrent AF.   Assessment & Plan    1.  Persistent atrial fibrillation S/p PVI 10/06/17. Recurrent AF in first 3 months post ablation is typical - would continue amiodarone and cardiovert as necessary to maintain SR in the healing period.  RA was found to be very large during ablation making long term success rates lower. Can consider AVN ablation and pacing down the road, but would give PVI 3 months first to see if we can maintain SR post healing phase.  Plan for DCCV today  Continue [place]   2.  Acute on chronic systolic heart failure Appreciate AHF team Will defer diuretics to them today Renal function stable from yesterday   3.  Presumed tachycardia mediated cardiomyopathy Repeat echo after 3 months of SR   Signed, Chanetta Marshall, NP  10/13/2017, 7:04 AM   Anticipate discharge in the morning.  Hopefully he will be of the  diuresis with his torsemide today (per heart failure team) renal function is relatively stable.  Hopefully cardioversion and euvolemia will allow him to hold sinus rhythm

## 2017-10-14 ENCOUNTER — Encounter (HOSPITAL_COMMUNITY): Payer: Self-pay | Admitting: Cardiovascular Disease

## 2017-10-14 LAB — BASIC METABOLIC PANEL
Anion gap: 12 (ref 5–15)
BUN: 29 mg/dL — AB (ref 6–20)
CALCIUM: 8.7 mg/dL — AB (ref 8.9–10.3)
CO2: 31 mmol/L (ref 22–32)
CREATININE: 1.7 mg/dL — AB (ref 0.61–1.24)
Chloride: 93 mmol/L — ABNORMAL LOW (ref 101–111)
GFR, EST AFRICAN AMERICAN: 43 mL/min — AB (ref >60)
GFR, EST NON AFRICAN AMERICAN: 37 mL/min — AB (ref >60)
Glucose, Bld: 106 mg/dL — ABNORMAL HIGH (ref 65–99)
Potassium: 3.6 mmol/L (ref 3.5–5.1)
SODIUM: 136 mmol/L (ref 135–145)

## 2017-10-14 MED ORDER — LOSARTAN POTASSIUM 25 MG PO TABS: 12.5000 mg | ORAL_TABLET | Freq: Every day | ORAL | 11 refills | Status: DC

## 2017-10-14 MED ORDER — SPIRONOLACTONE 25 MG PO TABS: 12.5000 mg | ORAL_TABLET | Freq: Every day | ORAL | 1 refills | Status: DC

## 2017-10-14 MED ORDER — AMIODARONE HCL 200 MG PO TABS: ORAL_TABLET | ORAL | 0 refills | Status: DC

## 2017-10-14 MED ORDER — TORSEMIDE 20 MG PO TABS: 40.0000 mg | ORAL_TABLET | Freq: Every day | ORAL | 1 refills | Status: DC

## 2017-10-14 MED ORDER — LOSARTAN POTASSIUM 25 MG PO TABS
12.5000 mg | ORAL_TABLET | Freq: Every day | ORAL | Status: DC
  Administered 2017-10-14: 12.5 mg via ORAL

## 2017-10-14 NOTE — Care Management Important Message (Signed)
Important Message  Patient Details  Name: Travis [name] MRN: [id number] Date of Birth: [birth year]   Medicare Important Message Given:  Yes    Schelly Chuba P Rena Sweeden 10/14/2017, 3:22 PM

## 2017-10-14 NOTE — Progress Notes (Signed)
Pt given discharge instructions, IV and tele removed. Questions answered. No further needs at this time.  Taken out via wheelchair.

## 2017-10-14 NOTE — Anesthesia Postprocedure Evaluation (Signed)
Anesthesia Post Note  Patient: Travis [name]  Procedure(s) Performed: CARDIOVERSION (N/A )     Patient location during evaluation: Endoscopy Anesthesia Type: General Level of consciousness: awake and alert Pain management: pain level controlled Vital Signs Assessment: post-procedure vital signs reviewed and stable Respiratory status: spontaneous breathing, nonlabored ventilation, respiratory function stable and patient connected to nasal cannula oxygen Cardiovascular status: blood pressure returned to baseline and stable Postop Assessment: no apparent nausea or vomiting Anesthetic complications: no    Last Vitals:  Vitals:   10/14/17 0451 10/14/17 0911  BP: 114/76 114/69  Pulse: (!) 58 (!) 56  Resp: 20   Temp: 36.6 C   SpO2: 93% 98%    Last Pain:  Vitals:   10/14/17 0451  TempSrc: Oral  PainSc:    Pain Goal:                 [name]

## 2017-10-14 NOTE — Discharge Summary (Signed)
ELECTROPHYSIOLOGY PROCEDURE DISCHARGE SUMMARY    Patient ID: Travis [name],  MRN: [id number], DOB/AGE: 78/16/1942 78 y.o.  Admit date: 10/10/2017 Discharge date: 10/14/2017  Primary Care Physician: [name], MD Primary Cardiologist: Aundra Dubin Electrophysiologist: Caryl Comes  Primary Discharge Diagnosis:  Active Problems:   Atrial fibrillation with RVR ([place])   Acute on chronic systolic CHF (congestive heart failure) ([place])  Secondary Discharge Diagnosis: 1.  Bicuspid aortic valve 2.  LBBB 3.  Presumed tachycardia mediated cardiomyopathy 4.  Sinus bradycardia  No Known Allergies  Procedures This Admission:  1.  Direct current cardioversion on 10/13/17 by Dr Johnsie Cancel. There were no early apparent complications.   Brief HPI/Hospital Course:  Travis [name] is a 77 y.o. male with the above past medical history.  He underwent PVI 10/07/17 by Dr Rayann Heman.  He presented to the ER for evaluation of worsening LE edema and recurrent AF.  His amiodarone was increased and he underwent cardioversion 10/13/17 which restored SR.  He was seen by the AHF team in consultation who made medication adjustments for home regimen.  He was seen by Dr Caryl Comes and considered stable for discharge to home.  He will have early follow up with the AHF team and AF clinic. He will need repeat echo after 3 months of SR.  If EF remains depressed, will need ischemic eval and likely CRTD.  If he has recurrent AF after 3 month healing phase from AF ablation, will need to consider AVN ablation and pacing.   Physical Exam: Vitals:   10/13/17 2107 10/13/17 2108 10/14/17 0451 10/14/17 0911  BP:  107/67 114/76 114/69  Pulse: (!) 54 (!) 56 (!) 58 (!) 56  Resp:  18 20   Temp:  (!) 97.5 F (36.4 C) 97.8 F (36.6 C)   TempSrc:  Oral Oral   SpO2: 95% 95% 93% 98%  Weight:   159 lb 4.8 oz (72.3 kg)   Height:        Labs:   Lab Results  Component Value Date   WBC 8.2 10/11/2017   HGB 14.4 10/11/2017   HCT 43.1 10/11/2017    MCV 98.6 10/11/2017   PLT 243 10/11/2017    Recent Labs  Lab 10/10/17 0912  10/14/17 0533  NA 133*   < > 136  K 4.1   < > 3.6  CL 100*   < > 93*  CO2 21*   < > 31  BUN 27*   < > 29*  CREATININE 1.36*   < > 1.70*  CALCIUM 8.6*   < > 8.7*  PROT 6.3*  --   --   BILITOT 1.4*  --   --   ALKPHOS 89  --   --   ALT 17  --   --   AST 26  --   --   GLUCOSE 115*   < > 106*   < > = values in this interval not displayed.     Discharge Medications:  Allergies as of 10/14/2017   No Known Allergies     Medication List    STOP taking these medications   furosemide 40 MG tablet Commonly known as:  LASIX     TAKE these medications   acetaminophen 500 MG tablet Commonly known as:  TYLENOL Take 1,000 mg by mouth every 6 (six) hours as needed for mild pain or headache.   ALPRAZolam 0.5 MG tablet Commonly known as:  XANAX TAKE 1 TABLET BY MOUTH TWICE DAILY  AS NEEDED FOR ANXIETY( SEPARATE BY 8 HOURS FROM TEMAZEPAM)   amiodarone 200 MG tablet Commonly known as:  PACERONE Take 400mg  twice daily for 1 week then 200mg  twice daily for 2 weeks then 200mg  daily What changed:    how much to take  how to take this  when to take this  additional instructions   atorvastatin 40 MG tablet Commonly known as:  LIPITOR Take 1 tablet (40 mg total) by mouth once a week. What changed:  when to take this   carvedilol 12.5 MG tablet Commonly known as:  COREG Take 1 tablet (12.5 mg total) by mouth 2 (two) times daily with a meal.   finasteride 1 MG tablet Commonly known as:  PROPECIA TAKE 1 TABLET(1 MG) BY MOUTH DAILY   hydroxypropyl methylcellulose / hypromellose 2.5 % ophthalmic solution Commonly known as:  ISOPTO TEARS / GONIOVISC Place 2 drops into both eyes 3 (three) times daily as needed for dry eyes.   losartan 25 MG tablet Commonly known as:  COZAAR Take 0.5 tablets (12.5 mg total) by mouth daily.   meloxicam 15 MG tablet Commonly known as:  MOBIC TAKE 1 TABLET BY MOUTH  DAILY   omeprazole 20 MG capsule Commonly known as:  PRILOSEC TAKE 1 CAPSULE BY MOUTH DAILY What changed:    how much to take  how to take this  when to take this   potassium chloride SA 20 MEQ tablet Commonly known as:  K-DUR,KLOR-CON Take 2 tablets (40 mEq total) by mouth daily.   Rivaroxaban 15 MG Tabs tablet Commonly known as:  XARELTO Take 1 tablet (15 mg total) by mouth daily with supper.   spironolactone 25 MG tablet Commonly known as:  ALDACTONE Take 0.5 tablets (12.5 mg total) by mouth at bedtime.   temazepam 15 MG capsule Commonly known as:  RESTORIL TAKE ONE CAPSULE BY MOUTH AT BEDTIME AS NEEDED What changed:    how much to take  how to take this  when to take this   torsemide 20 MG tablet Commonly known as:  DEMADEX Take 2 tablets (40 mg total) by mouth daily.       Disposition: Pt is being discharged home today in good condition. Discharge Instructions    Diet - low sodium heart healthy   Complete by:  As directed    Increase activity slowly   Complete by:  As directed      Follow-up Information    [facility] Follow up on 11/09/2017.   Specialty:  Cardiology Why:  at 11:30AM Contact information: [address] [id number] [place] [phone]       Shirley Friar, PA-C Follow up on 10/23/2017.   Specialty:  Physician Assistant Why:  Heart Failure Followup-9:30-Parking at ER lot (enter under blue "Specialty Clinics" awning) or under [place] on [address], materials entrance, garage code: 1200, elevator to 1st floor). Take all am meds, bring all med bottles. Contact information: [place] [address] [phone]           Duration of Discharge Encounter: Greater than 30 minutes including physician time.  Signed, Chanetta Marshall, NP 10/14/2017 10:07 AM

## 2017-10-14 NOTE — Progress Notes (Signed)
Patient ID: Travis [name], male   DOB: 1940-11-16, 77 y.o.   MRN: [id number]     Advanced Heart Failure Rounding Note  PCP-Cardiologist: Virl Axe, MD   Subjective:    Patient is now in NSR s/p DCCV.  No complaints today, no dyspnea.     Objective:   Weight Range: 159 lb 4.8 oz (72.3 kg) Body mass index is 24.22 kg/m.   Vital Signs:   Temp:  [96.6 F (35.9 C)-97.8 F (36.6 C)] 97.8 F (36.6 C) (05/22 0451) Pulse Rate:  [46-104] 56 (05/22 0911) Resp:  [15-20] 20 (05/22 0451) BP: (83-123)/(53-99) 114/69 (05/22 0911) SpO2:  [91 %-100 %] 98 % (05/22 0911) Weight:  [159 lb 4.8 oz (72.3 kg)] 159 lb 4.8 oz (72.3 kg) (05/22 0451) Last BM Date: 10/12/17  Weight change: Filed Weights   10/12/17 0349 10/13/17 0603 10/14/17 0451  Weight: 164 lb 12.8 oz (74.8 kg) 161 lb 6 oz (73.2 kg) 159 lb 4.8 oz (72.3 kg)    Intake/Output:   Intake/Output Summary (Last 24 hours) at 10/14/2017 0939 Last data filed at 10/14/2017 0900 Gross per 24 hour  Intake 1098 ml  Output 1200 ml  Net -102 ml      Physical Exam    General: NAD Neck: No JVD, no thyromegaly or thyroid nodule.  Lungs: Clear to auscultation bilaterally with normal respiratory effort. CV: Nondisplaced PMI.  Heart regular S1/S2, no S3/S4, no murmur.  No peripheral edema.   Abdomen: Soft, nontender, no hepatosplenomegaly, no distention.  Skin: Intact without lesions or rashes.  Neurologic: Alert and oriented x 3.  Psych: Normal affect. Extremities: No clubbing or cyanosis.  HEENT: Normal.    Telemetry   NSR in 50s (personally reviewed)  Labs    CBC No results for input(s): WBC, NEUTROABS, HGB, HCT, MCV, PLT in the last 72 hours. Basic Metabolic Panel Recent Labs    10/13/17 0512 10/14/17 0533  NA 138 136  K 4.0 3.6  CL 93* 93*  CO2 33* 31  GLUCOSE 115* 106*  BUN 28* 29*  CREATININE 1.68* 1.70*  CALCIUM 9.0 8.7*  MG 1.9  --    Liver Function Tests No results for input(s): AST, ALT, ALKPHOS, BILITOT,  PROT, ALBUMIN in the last 72 hours. No results for input(s): LIPASE, AMYLASE in the last 72 hours. Cardiac Enzymes No results for input(s): CKTOTAL, CKMB, CKMBINDEX, TROPONINI in the last 72 hours.  BNP: BNP (last 3 results) Recent Labs    08/12/17 1531 09/17/17 1313 10/10/17 0912  BNP 649.3* 1,399.3* 3,394.4*    ProBNP (last 3 results) No results for input(s): PROBNP in the last 8760 hours.   D-Dimer No results for input(s): DDIMER in the last 72 hours. Hemoglobin A1C No results for input(s): HGBA1C in the last 72 hours. Fasting Lipid Panel No results for input(s): CHOL, HDL, LDLCALC, TRIG, CHOLHDL, LDLDIRECT in the last 72 hours. Thyroid Function Tests No results for input(s): TSH, T4TOTAL, T3FREE, THYROIDAB in the last 72 hours.  Invalid input(s): FREET3  Other results:   Imaging    No results found.   Medications:     Scheduled Medications: . amiodarone  400 mg Oral BID  . atorvastatin  40 mg Oral q1800  . carvedilol  12.5 mg Oral BID WC  . losartan  12.5 mg Oral Daily  . pantoprazole  40 mg Oral Daily  . potassium chloride SA  20 mEq Oral Daily  . Rivaroxaban  15 mg Oral Q supper  .  spironolactone  12.5 mg Oral QHS  . torsemide  40 mg Oral Daily    Infusions:   PRN Medications: acetaminophen, nitroGLYCERIN, ondansetron (ZOFRAN) IV, polyvinyl alcohol   Assessment/Plan   1. Acute on chronic systolic CHF: Echo 6/81 with EF 30-35%, mild to moderately decreased RV systolic function.  Cause of cardiomyopathy uncertain.  It is possible that this is a tachycardia-mediated or afib-mediated cardiomyopathy given poor control of atrial fibrillation.  On exam, volume status improved.  Weight down again. Creatinine stable at 1.7 today.  - Continue torsemide 40 mg daily.  - Continue Coreg 12.5 mg bid.  - Continue spironolactone 12.5 daily.  - Start losartan 12.5 mg daily.   - Need to either keep in NSR or eventually AVN ablate/pace (see below).  Would repeat  echo in another 3 months or so while in NSR or after AVN ablation/pacing, and if EF still remains low, he will need LHC/RHC to assess for coronary disease as cause of cardiomyopathy.  2. Atrial fibrillation: Persistent, difficult to control rate and rhythm.  He failed Tikosyn.  He had atrial fibrillation ablation earlier this month.  He is now on amiodarone 400 mg bid. NSR today after DCCV yesterday.  - Hopefully DCCV in conjunction with recent ablation will allow him to hold NSR.  - Continue Xarelto and Coreg.  - If he fails to maintain NSR despite amiodarone and DCCV a few months post-PVI, will need AV nodal ablation/BiV pacing.  3. CAD: Extensive coronary calcification on recent CT chest.  No chest pain.  Mild troponin rise this admission likely due to demand ischemia with volume overload and afib/RVR.  - As above, if EF does not improve with NSR or ablation/BiV pacing, he will need coronary angiography eventually to rule out CAD as cause of cardiomyopathy.   - Added statin.  4. CKD: Stage 3.  Creatinine stable today.  5. Aortic stenosis: Moderate on last echo with bicuspid valve.  6. Disposition: OK for home today.  Will followup in CHF clinic and afib clinic.  Cardiac meds for home: amiodarone taper, Coreg 12.5 bid, torsemide 40 daily, spironolactone 12.5 daily, losartan 12.5 daily, KCl 20 daily, Xarelto 15 daily, atorvastatin 40 daily.   Length of Stay: 4  [name], MD  10/14/2017, 9:39 AM  Advanced Heart Failure Team Pager [phone] (M-F; 7a - 4p)  Please contact [facility] for night-coverage after hours (4p -7a ) and weekends on [email or web address]

## 2017-10-14 NOTE — Discharge Instructions (Signed)

## 2017-10-14 NOTE — Progress Notes (Signed)
Electrophysiology Rounding Note  Patient Name: Travis [name] Date of Encounter: 10/14/2017  Primary Cardiologist: Aundra Dubin Electrophysiologist: Caryl Comes   Subjective   The patient is doing well today.  At this time, the patient denies chest pain, shortness of breath, or any new concerns. He wants to go home   Inpatient Medications    Scheduled Meds: . amiodarone  400 mg Oral BID  . atorvastatin  40 mg Oral q1800  . carvedilol  12.5 mg Oral BID WC  . pantoprazole  40 mg Oral Daily  . potassium chloride SA  20 mEq Oral Daily  . Rivaroxaban  15 mg Oral Q supper  . spironolactone  12.5 mg Oral QHS  . torsemide  40 mg Oral Daily   Continuous Infusions:  PRN Meds: acetaminophen, nitroGLYCERIN, ondansetron (ZOFRAN) IV, polyvinyl alcohol   Vital Signs    Vitals:   10/13/17 2107 10/13/17 2107 10/13/17 2108 10/14/17 0451  BP:   107/67 114/76  Pulse: (!) 54 (!) 54 (!) 56 (!) 58  Resp:   18 20  Temp:   (!) 97.5 F (36.4 C) 97.8 F (36.6 C)  TempSrc:   Oral Oral  SpO2: 95% 95% 95% 93%  Weight:    159 lb 4.8 oz (72.3 kg)  Height:        Intake/Output Summary (Last 24 hours) at 10/14/2017 0715 Last data filed at 10/14/2017 0500 Gross per 24 hour  Intake 498 ml  Output 1200 ml  Net -702 ml   Filed Weights   10/12/17 0349 10/13/17 0603 10/14/17 0451  Weight: 164 lb 12.8 oz (74.8 kg) 161 lb 6 oz (73.2 kg) 159 lb 4.8 oz (72.3 kg)    Physical Exam    GEN- The patient is well appearing, alert and oriented x 3 today.   Head- normocephalic, atraumatic Eyes-  Sclera clear, conjunctiva pink Ears- hearing intact Oropharynx- clear Neck- supple Lungs- Clear to ausculation bilaterally, normal work of breathing Heart- Regular rate and rhythm  GI- soft, NT, ND, + BS Extremities- no clubbing, cyanosis, or edema Skin- no rash or lesion Psych- euthymic mood, full affect Neuro- strength and sensation are intact  Labs    CBC No results for input(s): WBC, NEUTROABS, HGB, HCT, MCV,  PLT in the last 72 hours. Basic Metabolic Panel Recent Labs    10/13/17 0512 10/14/17 0533  NA 138 136  K 4.0 3.6  CL 93* 93*  CO2 33* 31  GLUCOSE 115* 106*  BUN 28* 29*  CREATININE 1.68* 1.70*  CALCIUM 9.0 8.7*  MG 1.9  --     Telemetry    SR with PAC's  (personally reviewed)  Radiology    No results found.   Patient Profile     Earnestine Tuohey Meltonis a 77 y.o.malewith a past medical history significant for persistent atrial fibrillation (s/p PVI 09/02/79), chronic systolic heart failure (felt to be tachycardia mediated), hypertension. He was admitted for significant LE edema and found to be in recurrent AF.    Assessment & Plan    1.  Persistent atrial fibrillation Maintaining SR post DCCV yesterday Continue amiodarone 400mg  bid x1 week then 200mg  bid x 2 weeks then 200mg  daily Continue OAC  2.  Acute on chronic systolic heart failure Significantly improved since admission Probably ready for discharge today Will await AHF recommendations for discharge meds  3.  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

## 2017-10-14 NOTE — Progress Notes (Signed)
Pharmacist Heart Failure Core Measure Documentation  Assessment: Travis [name] has an EF documented as 30% on echo.  Rationale: Heart failure patients with left ventricular systolic dysfunction (LVSD) and an EF < 40% should be prescribed an angiotensin converting enzyme inhibitor (ACEI) or angiotensin receptor blocker (ARB) at discharge unless a contraindication is documented in the medical record.  This patient is not currently on an ACEI or ARB for HF.  This note is being placed in the record in order to provide documentation that a contraindication to the use of these agents is present for this encounter.  ACE Inhibitor or Angiotensin Receptor Blocker is contraindicated (specify all that apply)  []   ACEI allergy AND ARB allergy []   Angioedema []   Moderate or severe aortic stenosis []   Hyperkalemia [x]   Hypotension []   Renal artery stenosis [x]   Worsening renal function, preexisting renal disease or dysfunction   Georgina Peer 10/14/2017 9:21 AM

## 2017-10-22 NOTE — Progress Notes (Signed)
Advanced Heart Failure Clinic Note   Referring Physician: PCP: [name], MD PCP-Cardiologist: Virl Axe, MD   HPI:  Travis [name] is a 77 y.o. male with h/or persistent Afib s/p multiple failed DCCV, failed tikosyn, and now failed ablation, Bicuspid AV with moderate AS and Mild AI, HTN, HLD, and Chronic systolic CHF (EF 97-35%), presumed tachy mediated CMP.   Pt failed DCCV 3/22, 4/8, and 09/17/17. Failed Tikosyn. Did have conversion with Amiodarone and DCCV. Pt underwent successful ablation 10/07/17  Admitted 5/18 - 10/14/17. Diuresed and loaded with Amio. Underwent successful DCCV 10/13/17.   He presents today for post hospital follow up. Feeling "great". He has not felt any palpitations or irregular heart beats, and can usually tell when he is in AFib. He denies peripheral edema, CP, orthopnea, or PND. He has occasional lightheadedness with rapid standing, but it is not marked or limiting. Happens once or twice a week at most. He would like to start back exercising. He denies SOB with any ADLs or flat ground.   Review of systems complete and found to be negative unless listed in HPI.    Past Medical History 1. Chronic systolic CHF - Echo 08/21/9240 LVEF 30-35%, Mod AS, Mild AI, Severe LAE, RV mild dilated and mild/mod reduced, PA peak pressure 46 mm Hg. - Presumed tachy mediated - Need to either keep in NSR or eventually AVN ablate/pace (see below). Would repeat echo in another 3 months or so while in NSR or after AVN ablation/pacing, and if EF still remains low, he will need LHC/RHC to assess for coronary disease as cause of cardiomyopathy.  2. Afib, persistent, difficult to control.  - Has failed tikoysn and numerous DCCV.  - If fails again, will need AV nodal ablation/BiV pacing.  3. Bicuspid AV with moderate AS and Mild AI 4. HTN 5. HLD - On statin  Past Medical History:  Diagnosis Date  . Acute kidney injury ([place])   . Arthritis   . Bicuspid aortic valve   .  Cardiomyopathy -resolved    tachycardia-induced, EF 55% June 2009  . CHF (congestive heart failure) ([place])    JULY 2014  . GERD (gastroesophageal reflux disease)   . History of cardioversion 12/16/2012  . History of stomach ulcers   . Hypertension   . Left bundle branch block   . Persistent atrial fibrillation ([place])    multiple prior cardioversion  . Sinus bradycardia   . Status post clamping of cerebral aneurysm 80    Current Outpatient Medications  Medication Sig Dispense Refill  . amiodarone (PACERONE) 200 MG tablet Take 400mg  twice daily for 1 week then 200mg  twice daily for 2 weeks then 200mg  daily 60 tablet 0  . atorvastatin (LIPITOR) 40 MG tablet Take 1 tablet (40 mg total) by mouth once a week. (Patient taking differently: Take 40 mg by mouth every Sunday. ) 14 tablet 3  . carvedilol (COREG) 12.5 MG tablet Take 1 tablet (12.5 mg total) by mouth 2 (two) times daily with a meal. 60 tablet 3  . finasteride (PROPECIA) 1 MG tablet TAKE 1 TABLET(1 MG) BY MOUTH DAILY 30 tablet 5  . losartan (COZAAR) 25 MG tablet Take 0.5 tablets (12.5 mg total) by mouth daily. 30 tablet 11  . meloxicam (MOBIC) 15 MG tablet TAKE 1 TABLET BY MOUTH DAILY 90 tablet 0  . omeprazole (PRILOSEC) 20 MG capsule TAKE 1 CAPSULE BY MOUTH DAILY (Patient taking differently: Take 20 mg by mouth once a day) 90 capsule  0  . potassium chloride SA (K-DUR,KLOR-CON) 20 MEQ tablet Take 2 tablets (40 mEq total) by mouth daily. 60 tablet 2  . Rivaroxaban (XARELTO) 15 MG TABS tablet Take 1 tablet (15 mg total) by mouth daily with supper. 60 tablet 2  . spironolactone (ALDACTONE) 25 MG tablet Take 0.5 tablets (12.5 mg total) by mouth at bedtime. 90 tablet 1  . temazepam (RESTORIL) 15 MG capsule TAKE ONE CAPSULE BY MOUTH AT BEDTIME AS NEEDED (Patient taking differently: Take 15 mg by mouth at bedtime as needed for sleep) 30 capsule 2  . torsemide (DEMADEX) 20 MG tablet Take 2 tablets (40 mg total) by mouth daily. 90 tablet 1  .  acetaminophen (TYLENOL) 500 MG tablet Take 1,000 mg by mouth every 6 (six) hours as needed for mild pain or headache.     . ALPRAZolam (XANAX) 0.5 MG tablet TAKE 1 TABLET BY MOUTH TWICE DAILY AS NEEDED FOR ANXIETY( SEPARATE BY 8 HOURS FROM TEMAZEPAM) 5 tablet 0  . hydroxypropyl methylcellulose / hypromellose (ISOPTO TEARS / GONIOVISC) 2.5 % ophthalmic solution Place 2 drops into both eyes 3 (three) times daily as needed for dry eyes.     No current facility-administered medications for this encounter.     No Known Allergies    Social History   Socioeconomic History  . Marital status: Single    Spouse name: Not on file  . Number of children: 0  . Years of education: Not on file  . Highest education level: Not on file  Occupational History  . Occupation: Retired  Scientific laboratory technician  . Financial resource strain: Not on file  . Food insecurity:    Worry: Not on file    Inability: Not on file  . Transportation needs:    Medical: Not on file    Non-medical: Not on file  Tobacco Use  . Smoking status: Former Smoker    Packs/day: 1.50    Years: 25.00    Pack years: 37.50    Last attempt to quit: 05/26/1986    Years since quitting: 31.4  . Smokeless tobacco: Never Used  Substance and Sexual Activity  . Alcohol use: Yes    Alcohol/week: 0.5 oz    Types: 1 Standard drinks or equivalent per week    Comment: stopped drinking   . Drug use: No  . Sexual activity: Not Currently  Lifestyle  . Physical activity:    Days per week: Not on file    Minutes per session: Not on file  . Stress: Not on file  Relationships  . Social connections:    Talks on phone: Not on file    Gets together: Not on file    Attends religious service: Not on file    Active member of club or organization: Not on file    Attends meetings of clubs or organizations: Not on file    Relationship status: Not on file  . Intimate partner violence:    Fear of current or ex partner: Not on file    Emotionally abused: Not  on file    Physically abused: Not on file    Forced sexual activity: Not on file  Other Topics Concern  . Not on file  Social History Narrative   Homosexual. Lives alone. Not sexually active.       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 Stroke Father 33       smoker  . Lung cancer Mother 53       former smoker  . Stroke Mother   . Stroke Brother   . Colon cancer Neg Hx     Vitals:   10/23/17 0932  BP: 110/62  Pulse: (!) 52  SpO2: 98%  Weight: 167 lb 9.6 oz (76 kg)   Wt Readings from Last 3 Encounters:  10/23/17 167 lb 9.6 oz (76 kg)  10/14/17 159 lb 4.8 oz (72.3 kg)  10/07/17 187 lb 8 oz (85 kg)    PHYSICAL EXAM: General:  Well appearing. No respiratory difficulty HEENT: normal Neck: supple. no JVD. Carotids 2+ bilat; no bruits. No lymphadenopathy or thyromegaly appreciated. Cor: PMI nondisplaced. Regular rate & rhythm. No rubs, gallops or murmurs. Lungs: clear Abdomen: soft, nontender, nondistended. No hepatosplenomegaly. No bruits or masses. Good bowel sounds. Extremities: no cyanosis, clubbing, rash, edema Neuro: alert & oriented x 3, cranial nerves grossly intact. moves all 4 extremities w/o difficulty. Affect pleasant.  ECG: Sinus bradycardia 50 bpm with 1st degree AV block with PACs.   ASSESSMENT & PLAN: 1. Chronic systolic CHF: Echo 4/96 with EF 30-35%, mild to moderately decreased RV systolic function. Cause of cardiomyopathy uncertain. It is possible that this is a tachycardia-mediated or afib-mediated cardiomyopathy given poor control of atrial fibrillation.  - NYHA II currently - Volume status stable on exam.   - Continue torsemide 40 mg daily.  - Continue Coreg 12.5 mg bid.  - Continue spironolactone 12.5 daily.  - Continue losartan 12.5 mg daily.   - Need to either keep in NSR or eventually AVN  ablate/pace (see below). Would repeat echo in another 3 months or so while in NSR or after AVN ablation/pacing, and if EF still remains low, he will need LHC/RHC to assess for coronary disease as cause of cardiomyopathy.  - Reinforced fluid restriction to < 2 L daily, sodium restriction to less than 2000 mg daily, and the importance of daily weights.   2. Atrial fibrillation: Persistent, difficult to control rate and rhythm. He failed Tikosyn. He had atrial fibrillation ablation earlier this month.  - Continue amiodarone 200 mg BID per EP.  - EKG today shows sinus brady - Continue Xarelto and Coreg at current doses.  - If he fails to maintain NSR despite amiodarone and DCCV a few months post-PVI, will need AV nodal ablation/BiV pacing.  3. CAD: Extensive coronary calcification on recent CT chest. No chest pain. Mild troponin rise this admission likely due to demand ischemia with volume overload and afib/RVR.  -As above, if EF does not improve with NSR or ablation/BiV pacing, he will need coronary angiography eventually to rule out CAD as cause of cardiomyopathy. - Continue statin.  4. CKD: Stage 3 - BMET today.  5. Aortic stenosis:  - Moderate on last echo with bicuspid valve. - No change to current plan.    Labs today. No med changes with soft pressures and borderline CKD. RTC 6 weeks. Sooner with symptoms. Echo in September with [name]. If EF remains low will need L/RHC consideration.   Shirley Friar, PA-C 10/23/17   Greater than 50% of the 25 minute visit was spent in counseling/coordination of care regarding disease state education, salt/fluid restriction, sliding scale diuretics, and medication compliance.

## 2017-10-23 ENCOUNTER — Encounter (HOSPITAL_COMMUNITY): Payer: Self-pay

## 2017-10-23 ENCOUNTER — Telehealth (HOSPITAL_COMMUNITY): Payer: Self-pay | Admitting: *Deleted

## 2017-10-23 ENCOUNTER — Ambulatory Visit (HOSPITAL_COMMUNITY)
Admission: RE | Admit: 2017-10-23 | Discharge: 2017-10-23 | Disposition: A | Discharge status: Home / Self Care | Payer: Medicare Other | Source: Ambulatory Visit | Attending: Internal Medicine | Admitting: Internal Medicine

## 2017-10-23 VITALS — BP 110/62 | HR 52 | Wt 167.6 lb

## 2017-10-23 DIAGNOSIS — Z7901 Long term (current) use of anticoagulants: Secondary | ICD-10-CM | POA: Diagnosis not present

## 2017-10-23 DIAGNOSIS — E785 Hyperlipidemia, unspecified: Secondary | ICD-10-CM | POA: Insufficient documentation

## 2017-10-23 DIAGNOSIS — I2510 Atherosclerotic heart disease of native coronary artery without angina pectoris: Secondary | ICD-10-CM | POA: Diagnosis not present

## 2017-10-23 DIAGNOSIS — Z09 Encounter for follow-up examination after completed treatment for conditions other than malignant neoplasm: Secondary | ICD-10-CM | POA: Diagnosis not present

## 2017-10-23 DIAGNOSIS — I420 Dilated cardiomyopathy: Secondary | ICD-10-CM

## 2017-10-23 DIAGNOSIS — I5022 Chronic systolic (congestive) heart failure: Secondary | ICD-10-CM

## 2017-10-23 DIAGNOSIS — K219 Gastro-esophageal reflux disease without esophagitis: Secondary | ICD-10-CM | POA: Diagnosis not present

## 2017-10-23 DIAGNOSIS — Z791 Long term (current) use of non-steroidal anti-inflammatories (NSAID): Secondary | ICD-10-CM | POA: Diagnosis not present

## 2017-10-23 DIAGNOSIS — Z79899 Other long term (current) drug therapy: Secondary | ICD-10-CM | POA: Insufficient documentation

## 2017-10-23 DIAGNOSIS — I481 Persistent atrial fibrillation: Secondary | ICD-10-CM

## 2017-10-23 DIAGNOSIS — Z87891 Personal history of nicotine dependence: Secondary | ICD-10-CM | POA: Diagnosis not present

## 2017-10-23 DIAGNOSIS — M1990 Unspecified osteoarthritis, unspecified site: Secondary | ICD-10-CM | POA: Diagnosis not present

## 2017-10-23 DIAGNOSIS — I4819 Other persistent atrial fibrillation: Secondary | ICD-10-CM

## 2017-10-23 DIAGNOSIS — I130 Hypertensive heart and chronic kidney disease with heart failure and stage 1 through stage 4 chronic kidney disease, or unspecified chronic kidney disease: Secondary | ICD-10-CM | POA: Diagnosis not present

## 2017-10-23 DIAGNOSIS — Z8719 Personal history of other diseases of the digestive system: Secondary | ICD-10-CM | POA: Diagnosis not present

## 2017-10-23 DIAGNOSIS — I350 Nonrheumatic aortic (valve) stenosis: Secondary | ICD-10-CM | POA: Diagnosis not present

## 2017-10-23 DIAGNOSIS — Z823 Family history of stroke: Secondary | ICD-10-CM | POA: Diagnosis not present

## 2017-10-23 DIAGNOSIS — Z801 Family history of malignant neoplasm of trachea, bronchus and lung: Secondary | ICD-10-CM | POA: Diagnosis not present

## 2017-10-23 DIAGNOSIS — Q231 Congenital insufficiency of aortic valve: Secondary | ICD-10-CM | POA: Diagnosis not present

## 2017-10-23 DIAGNOSIS — N183 Chronic kidney disease, stage 3 (moderate): Secondary | ICD-10-CM | POA: Diagnosis not present

## 2017-10-23 LAB — BASIC METABOLIC PANEL
Anion gap: 5 (ref 5–15)
BUN: 29 mg/dL — AB (ref 6–20)
CALCIUM: 8.6 mg/dL — AB (ref 8.9–10.3)
CO2: 27 mmol/L (ref 22–32)
CREATININE: 1.61 mg/dL — AB (ref 0.61–1.24)
Chloride: 98 mmol/L — ABNORMAL LOW (ref 101–111)
GFR calc non Af Amer: 40 mL/min — ABNORMAL LOW (ref >60)
GFR, EST AFRICAN AMERICAN: 46 mL/min — AB (ref >60)
Glucose, Bld: 106 mg/dL — ABNORMAL HIGH (ref 65–99)
Potassium: 5.2 mmol/L — ABNORMAL HIGH (ref 3.5–5.1)
SODIUM: 130 mmol/L — AB (ref 135–145)

## 2017-10-23 LAB — MAGNESIUM: Magnesium: 2.3 mg/dL (ref 1.7–2.4)

## 2017-10-23 NOTE — Patient Instructions (Signed)
Routine lab work today. Will notify you of abnormal results, otherwise no news is good news!  NO changes to medication at this time.  Follow up 6 weeks with Oda Kilts PA-C.  _____________________________________________________________ Travis [name] Code: 1400  Follow up 4 months with [name] and echocardiogram.  ______________________________________________________________ Travis [name] Code: 0034  Take all medication as prescribed the day of your appointment. Bring all medications with you to your appointment.  Do the following things EVERYDAY: 1) Weigh yourself in the morning before breakfast. Write it down and keep it in a log. 2) Take your medicines as prescribed 3) Eat low salt foods-Limit salt (sodium) to 2000 mg per day.  4) Stay as active as you can everyday 5) Limit all fluids for the day to less than 2 liters

## 2017-10-23 NOTE — Telephone Encounter (Signed)
Result Notes for Basic metabolic panel   Notes recorded by Darron Doom, RN on 10/23/2017 at 1:40 PM EDT Patient called back and he was only taking 40 meq daily, he will stop taking it. Lab appointment scheduled, bmet ordered, and potassium discontinued. ------  Notes recorded by Kerry Dory, CMA on 10/23/2017 at 12:38 PM EDT Left message for patient to call back.  [phone] (H) ------  Notes recorded by Shirley Friar, PA-C on 10/23/2017 at 11:49 AM EDT Stop potassium supplementation. (If only taking 40 meq daily). Needs repeat BMET mid next week.     Legrand Como [address] [place], PA-C 10/23/2017 11:49 AM

## 2017-10-26 DIAGNOSIS — Z961 Presence of intraocular lens: Secondary | ICD-10-CM | POA: Diagnosis not present

## 2017-10-29 ENCOUNTER — Ambulatory Visit (HOSPITAL_COMMUNITY)
Admission: RE | Admit: 2017-10-29 | Discharge: 2017-10-29 | Disposition: A | Discharge status: Home / Self Care | Payer: Medicare Other | Source: Ambulatory Visit | Attending: Internal Medicine | Admitting: Internal Medicine

## 2017-10-29 DIAGNOSIS — I5022 Chronic systolic (congestive) heart failure: Secondary | ICD-10-CM | POA: Insufficient documentation

## 2017-10-29 LAB — BASIC METABOLIC PANEL WITH GFR
Anion gap: 8 (ref 5–15)
BUN: 30 mg/dL — ABNORMAL HIGH (ref 6–20)
CO2: 29 mmol/L (ref 22–32)
Calcium: 8.8 mg/dL — ABNORMAL LOW (ref 8.9–10.3)
Chloride: 94 mmol/L — ABNORMAL LOW (ref 101–111)
Creatinine, Ser: 1.51 mg/dL — ABNORMAL HIGH (ref 0.61–1.24)
GFR calc Af Amer: 50 mL/min — ABNORMAL LOW
GFR calc non Af Amer: 43 mL/min — ABNORMAL LOW
Glucose, Bld: 101 mg/dL — ABNORMAL HIGH (ref 65–99)
Potassium: 5.2 mmol/L — ABNORMAL HIGH (ref 3.5–5.1)
Sodium: 131 mmol/L — ABNORMAL LOW (ref 135–145)

## 2017-10-30 ENCOUNTER — Telehealth (HOSPITAL_COMMUNITY): Payer: Self-pay

## 2017-10-30 NOTE — Telephone Encounter (Signed)
Result Notes for Basic metabolic panel   Notes recorded by Effie Berkshire, RN on 10/30/2017 at 2:52 PM EDT LVM for patient to return call to discuss lab results. ------  Notes recorded by Shirley Friar, PA-C on 10/30/2017 at 7:47 AM EDT Please call today.     Legrand Como [address]" [place], PA-C 10/30/2017 7:47 AM ------  Notes recorded by Shirley Friar, PA-C on 10/29/2017 at 2:10 PM EDT Make sure he stopped his Potassium. Needs to avoid High K foods. 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 If K remains high will need to start.

## 2017-11-02 ENCOUNTER — Telehealth (HOSPITAL_COMMUNITY): Payer: Self-pay | Admitting: *Deleted

## 2017-11-02 ENCOUNTER — Other Ambulatory Visit: Payer: Self-pay | Admitting: Family Medicine

## 2017-11-02 DIAGNOSIS — I5022 Chronic systolic (congestive) heart failure: Secondary | ICD-10-CM

## 2017-11-02 DIAGNOSIS — I6523 Occlusion and stenosis of bilateral carotid arteries: Secondary | ICD-10-CM

## 2017-11-02 NOTE — Telephone Encounter (Signed)
-----   Message from Shirley Friar, PA-C sent at 10/29/2017  2:10 PM EDT ----- Make sure he stopped his Potassium. Needs to avoid High K foods.  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 If K remains high will need to start.

## 2017-11-03 ENCOUNTER — Ambulatory Visit (HOSPITAL_COMMUNITY)
Admission: RE | Admit: 2017-11-03 | Discharge: 2017-11-03 | Disposition: A | Discharge status: Home / Self Care | Payer: Medicare Other | Source: Ambulatory Visit | Attending: Cardiology | Admitting: Cardiology

## 2017-11-03 DIAGNOSIS — I5022 Chronic systolic (congestive) heart failure: Secondary | ICD-10-CM

## 2017-11-03 DIAGNOSIS — I6523 Occlusion and stenosis of bilateral carotid arteries: Secondary | ICD-10-CM | POA: Diagnosis not present

## 2017-11-03 DIAGNOSIS — E785 Hyperlipidemia, unspecified: Secondary | ICD-10-CM | POA: Diagnosis not present

## 2017-11-03 DIAGNOSIS — I110 Hypertensive heart disease with heart failure: Secondary | ICD-10-CM | POA: Insufficient documentation

## 2017-11-03 DIAGNOSIS — Z87891 Personal history of nicotine dependence: Secondary | ICD-10-CM | POA: Diagnosis not present

## 2017-11-03 LAB — BASIC METABOLIC PANEL
ANION GAP: 9 (ref 5–15)
BUN: 32 mg/dL — ABNORMAL HIGH (ref 6–20)
CALCIUM: 9.1 mg/dL (ref 8.9–10.3)
CO2: 29 mmol/L (ref 22–32)
Chloride: 95 mmol/L — ABNORMAL LOW (ref 101–111)
Creatinine, Ser: 1.54 mg/dL — ABNORMAL HIGH (ref 0.61–1.24)
GFR calc non Af Amer: 42 mL/min — ABNORMAL LOW (ref >60)
GFR, EST AFRICAN AMERICAN: 48 mL/min — AB (ref >60)
GLUCOSE: 105 mg/dL — AB (ref 65–99)
Potassium: 4.7 mmol/L (ref 3.5–5.1)
Sodium: 133 mmol/L — ABNORMAL LOW (ref 135–145)

## 2017-11-04 ENCOUNTER — Ambulatory Visit (HOSPITAL_BASED_OUTPATIENT_CLINIC_OR_DEPARTMENT_OTHER)
Admission: RE | Admit: 2017-11-04 | Discharge: 2017-11-04 | Disposition: A | Discharge status: Home / Self Care | Payer: Medicare Other | Source: Ambulatory Visit | Attending: Cardiology | Admitting: Cardiology

## 2017-11-04 DIAGNOSIS — I6523 Occlusion and stenosis of bilateral carotid arteries: Secondary | ICD-10-CM

## 2017-11-04 DIAGNOSIS — I110 Hypertensive heart disease with heart failure: Secondary | ICD-10-CM | POA: Diagnosis not present

## 2017-11-04 DIAGNOSIS — E785 Hyperlipidemia, unspecified: Secondary | ICD-10-CM | POA: Diagnosis not present

## 2017-11-04 DIAGNOSIS — I5022 Chronic systolic (congestive) heart failure: Secondary | ICD-10-CM | POA: Diagnosis not present

## 2017-11-04 DIAGNOSIS — Z87891 Personal history of nicotine dependence: Secondary | ICD-10-CM | POA: Diagnosis not present

## 2017-11-09 ENCOUNTER — Other Ambulatory Visit (HOSPITAL_COMMUNITY): Payer: Self-pay | Admitting: Internal Medicine

## 2017-11-09 ENCOUNTER — Ambulatory Visit (HOSPITAL_COMMUNITY)
Admission: RE | Admit: 2017-11-09 | Discharge: 2017-11-09 | Disposition: A | Discharge status: Home / Self Care | Payer: Medicare Other | Source: Ambulatory Visit | Attending: Nurse Practitioner | Admitting: Nurse Practitioner

## 2017-11-09 ENCOUNTER — Encounter (HOSPITAL_COMMUNITY): Payer: Self-pay | Admitting: Nurse Practitioner

## 2017-11-09 VITALS — BP 132/68 | HR 49 | Ht 68.0 in | Wt 165.8 lb

## 2017-11-09 DIAGNOSIS — Z791 Long term (current) use of non-steroidal anti-inflammatories (NSAID): Secondary | ICD-10-CM | POA: Diagnosis not present

## 2017-11-09 DIAGNOSIS — I447 Left bundle-branch block, unspecified: Secondary | ICD-10-CM

## 2017-11-09 DIAGNOSIS — Z79899 Other long term (current) drug therapy: Secondary | ICD-10-CM | POA: Insufficient documentation

## 2017-11-09 DIAGNOSIS — I5022 Chronic systolic (congestive) heart failure: Secondary | ICD-10-CM | POA: Diagnosis not present

## 2017-11-09 DIAGNOSIS — I4819 Other persistent atrial fibrillation: Secondary | ICD-10-CM

## 2017-11-09 DIAGNOSIS — I428 Other cardiomyopathies: Secondary | ICD-10-CM | POA: Insufficient documentation

## 2017-11-09 DIAGNOSIS — N189 Chronic kidney disease, unspecified: Secondary | ICD-10-CM | POA: Insufficient documentation

## 2017-11-09 DIAGNOSIS — I481 Persistent atrial fibrillation: Secondary | ICD-10-CM | POA: Diagnosis not present

## 2017-11-09 DIAGNOSIS — I130 Hypertensive heart and chronic kidney disease with heart failure and stage 1 through stage 4 chronic kidney disease, or unspecified chronic kidney disease: Secondary | ICD-10-CM | POA: Insufficient documentation

## 2017-11-09 DIAGNOSIS — Z8711 Personal history of peptic ulcer disease: Secondary | ICD-10-CM | POA: Insufficient documentation

## 2017-11-09 DIAGNOSIS — I509 Heart failure, unspecified: Secondary | ICD-10-CM | POA: Diagnosis not present

## 2017-11-09 DIAGNOSIS — R001 Bradycardia, unspecified: Secondary | ICD-10-CM | POA: Diagnosis not present

## 2017-11-09 DIAGNOSIS — Q231 Congenital insufficiency of aortic valve: Secondary | ICD-10-CM | POA: Insufficient documentation

## 2017-11-09 DIAGNOSIS — Z7901 Long term (current) use of anticoagulants: Secondary | ICD-10-CM | POA: Diagnosis not present

## 2017-11-09 DIAGNOSIS — M1990 Unspecified osteoarthritis, unspecified site: Secondary | ICD-10-CM | POA: Insufficient documentation

## 2017-11-09 DIAGNOSIS — I4891 Unspecified atrial fibrillation: Secondary | ICD-10-CM | POA: Diagnosis present

## 2017-11-09 DIAGNOSIS — K219 Gastro-esophageal reflux disease without esophagitis: Secondary | ICD-10-CM | POA: Insufficient documentation

## 2017-11-09 DIAGNOSIS — Z9889 Other specified postprocedural states: Secondary | ICD-10-CM | POA: Diagnosis not present

## 2017-11-09 HISTORY — DX: Cardiomegaly: I51.7

## 2017-11-09 HISTORY — DX: Chronic kidney disease, unspecified: N18.9

## 2017-11-09 HISTORY — DX: Other cardiomyopathies: I42.8

## 2017-11-09 MED ORDER — CARVEDILOL 6.25 MG PO TABS: 6.2500 mg | ORAL_TABLET | Freq: Two times a day (BID) | ORAL | 3 refills | Status: DC

## 2017-11-09 NOTE — Patient Instructions (Signed)
Coreg to 6.25mg  twice a day

## 2017-11-09 NOTE — Progress Notes (Signed)
PCP: [name], MD Primary Cardiologist: Dr Nance Pear is a 77 y.o. male who presents today for routine electrophysiology followup.  Since his recent afib ablation, the patient reports doing very well.  he denies procedure related complications and is pleased with the results of the procedure.  He did have CHF early post ablation with ERAF and required rehospitalization.  He has done well since.  He was placed on amiodarone by Dr Caryl Comes.  He has also been enrolled in the CHF clinic.  He had edema, which has resolved.  No afib since discharge that he is aware of. Today, he denies symptoms of palpitations, chest pain, shortness of breath,  dizziness, presyncope, or syncope.  The patient is otherwise without complaint today.   Past Medical History:  Diagnosis Date  . Arthritis   . Biatrial enlargement    severe  . Bicuspid aortic valve   . CHF (congestive heart failure) ([place])    JULY 2014  . Chronic renal insufficiency   . GERD (gastroesophageal reflux disease)   . History of cardioversion 12/16/2012  . History of stomach ulcers   . Hypertension   . Left bundle branch block   . Nonischemic cardiomyopathy ([place])       . Persistent atrial fibrillation ([place])    multiple prior cardioversion  . Sinus bradycardia   . Status post clamping of cerebral aneurysm 80   Past Surgical History:  Procedure Laterality Date  . ATRIAL FIBRILLATION ABLATION N/A 10/06/2017   Procedure: ATRIAL FIBRILLATION ABLATION;  Surgeon: [name], MD;  Location: [facility];  Service: Cardiovascular;  Laterality: N/A;  . CARDIAC CATHETERIZATION    . CARDIOVERSION N/A 12/16/2012   Procedure: CARDIOVERSION;  Surgeon: Lelon Perla, MD;  Location: [facility] ENDOSCOPY;  Service: Cardiovascular;  Laterality: N/A;  . CARDIOVERSION N/A 08/14/2017   Procedure: CARDIOVERSION;  Surgeon: Josue Hector, MD;  Location: [facility] ENDOSCOPY;  Service: Cardiovascular;  Laterality: N/A;  . CARDIOVERSION N/A  09/21/2017   Procedure: CARDIOVERSION;  Surgeon: Sanda Klein, MD;  Location: [facility] ENDOSCOPY;  Service: Cardiovascular;  Laterality: N/A;  . CARDIOVERSION N/A 10/13/2017   Procedure: CARDIOVERSION;  Surgeon: Josue Hector, MD;  Location: [facility];  Service: Cardiovascular;  Laterality: N/A;  . CATARACT EXTRACTION  05/2015   bilateral  . cerebral anuersym post clips    . fractured left arm    . HERNIA REPAIR     lft  . INGUINAL HERNIA REPAIR  07/02/2011   Procedure: LAPAROSCOPIC INGUINAL HERNIA;  Surgeon: Harl Bowie, MD;  Location: [facility];  Service: General;  Laterality: Left;  Laparoscopic left inguinal hernia repair and mesh  . left tendon repair     lft foot  . ROTATOR CUFF REPAIR     lf  . TEE WITHOUT CARDIOVERSION N/A 08/14/2017   Procedure: TRANSESOPHAGEAL ECHOCARDIOGRAM (TEE);  Surgeon: Josue Hector, MD;  Location: [facility] ENDOSCOPY;  Service: Cardiovascular;  Laterality: N/A;  . TONSILLECTOMY    . TOTAL KNEE ARTHROPLASTY Bilateral 02/06/2014   Procedure: TOTAL KNEE BILATERAL;  Surgeon: Mauri Pole, MD;  Location: [facility] ORS;  Service: Orthopedics;  Laterality: Bilateral;  . WRIST GANGLION EXCISION     lft    ROS- all systems are personally reviewed and negatives except as per HPI above  Current Outpatient Medications  Medication Sig Dispense Refill  . acetaminophen (TYLENOL) 500 MG tablet Take 1,000 mg by mouth every 6 (six) hours as needed for mild pain or headache.     Marland Kitchen  ALPRAZolam (XANAX) 0.5 MG tablet TAKE 1 TABLET BY MOUTH TWICE DAILY AS NEEDED FOR ANXIETY( SEPARATE BY 8 HOURS FROM TEMAZEPAM) 5 tablet 0  . amiodarone (PACERONE) 200 MG tablet Take 200 mg by mouth daily.    Marland Kitchen atorvastatin (LIPITOR) 40 MG tablet Take 1 tablet (40 mg total) by mouth once a week. (Patient taking differently: Take 40 mg by mouth every Sunday. ) 14 tablet 3  . carvedilol (COREG) 6.25 MG tablet Take 1 tablet (6.25 mg total) by mouth 2 (two) times daily with a meal. 60 tablet 3  . finasteride  (PROPECIA) 1 MG tablet TAKE 1 TABLET(1 MG) BY MOUTH DAILY 30 tablet 5  . hydroxypropyl methylcellulose / hypromellose (ISOPTO TEARS / GONIOVISC) 2.5 % ophthalmic solution Place 2 drops into both eyes 3 (three) times daily as needed for dry eyes.    Marland Kitchen losartan (COZAAR) 25 MG tablet Take 0.5 tablets (12.5 mg total) by mouth daily. 30 tablet 11  . meloxicam (MOBIC) 15 MG tablet TAKE 1 TABLET BY MOUTH DAILY 90 tablet 0  . omeprazole (PRILOSEC) 20 MG capsule TAKE 1 CAPSULE BY MOUTH DAILY (Patient taking differently: Take 20 mg by mouth once a day) 90 capsule 0  . Rivaroxaban (XARELTO) 15 MG TABS tablet Take 1 tablet (15 mg total) by mouth daily with supper. 60 tablet 2  . spironolactone (ALDACTONE) 25 MG tablet Take 0.5 tablets (12.5 mg total) by mouth at bedtime. 90 tablet 1  . temazepam (RESTORIL) 15 MG capsule TAKE ONE CAPSULE BY MOUTH AT BEDTIME AS NEEDED (Patient taking differently: Take 15 mg by mouth at bedtime as needed for sleep) 30 capsule 2  . torsemide (DEMADEX) 20 MG tablet Take 2 tablets (40 mg total) by mouth daily. 90 tablet 1   No current facility-administered medications for this encounter.     Physical Exam: Vitals:   11/09/17 1119  BP: 132/68  Pulse: (!) 49  Weight: 165 lb 12.8 oz (75.2 kg)  Height: 5\' 8"  (1.727 m)    GEN- The patient is well appearing, alert and oriented x 3 today.   Head- normocephalic, atraumatic Eyes-  Sclera clear, conjunctiva pink Ears- hearing intact Oropharynx- clear Lungs- Clear to ausculation bilaterally, normal work of breathing Heart- Regular rate and rhythm, no murmurs, rubs or gallops, PMI not laterally displaced GI- soft, NT, ND, + BS Extremities- no clubbing, cyanosis, or edema  EKG tracing ordered today is personally reviewed and shows sinus bradycardia 49 bpm, PR 202 msec, QRS 150 msec, Qtc 457 msec, LBBB   Assessment and Plan:  1. Persistent  fibrillation Doing well s/p ablation, afib is currently controlled Amiodarone recently  started by Dr Caryl Comes.  Will try to wean if still in sinus on follow-up with me in August. chads2vasc score is 4.  Doing well with anticoagulation Given severe biatrial enlargement, would probably not advise repeat ablations if he has recurrence in the short term.  2. Sinus bradycardia Reduce coreg to 6.25mg  BID today.  3. Nonischemic CM with LBBB Hopefully EF will improve post ablation. euvolemic today If remains < 35% with echo in July, would consider CRT-D implant.  Will defer this decision to Dr Caryl Comes Followed in CHF clinic for medicine optimization.  Return to see me in August as scheduled  [name] MD, [facility] 11/09/2017 11:42 AM

## 2017-11-10 ENCOUNTER — Telehealth: Payer: Self-pay | Admitting: Nurse Practitioner

## 2017-11-10 ENCOUNTER — Other Ambulatory Visit: Payer: Self-pay | Admitting: Family Medicine

## 2017-11-10 NOTE — Telephone Encounter (Signed)
Walk in pt Form-[employer] paper dropped off by [employer] rep for [employer]. Placed in [employer].

## 2017-11-18 ENCOUNTER — Ambulatory Visit: Payer: Medicare Other | Admitting: Gastroenterology

## 2017-11-25 ENCOUNTER — Other Ambulatory Visit: Payer: Self-pay | Admitting: Gastroenterology

## 2017-12-01 NOTE — Progress Notes (Signed)
Advanced Heart Failure Clinic Note   Referring Physician: PCP: [name], MD PCP-Cardiologist: Virl Axe, MD  HF: [name]   HPI:  Travis [name] is a 77 y.o. 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   Pt failed DCCV 3/22, 4/8, and 09/17/17. Failed Tikosyn. Did have conversion with Amiodarone and DCCV. Pt underwent successful ablation 10/07/17  Admitted 5/18 - 10/14/17. Diuresed and loaded with Amio. Underwent successful DCCV 10/13/17.   He presents today for regular follow up. Feeling great overall. Denies palpitations or irregular heart beats. He can tell when he is in Afib and has not since his last DCCV. He denies peripheral edema, CP, orthopnea, or PND. He has occasional dizziness, but it is not related to activity or medications. He has started back exercising and continues to increase his activity. He denies SOB with any activity. Taking all medications as directed  Review of systems complete and found to be negative unless listed in HPI.    Past Medical History 1. Chronic systolic CHF - Echo 0/98/1191 LVEF 30-35%, Mod AS, Mild AI, Severe LAE, RV mild dilated and mild/mod reduced, PA peak pressure 46 mm Hg. - Presumed tachy mediated - Need to either keep in NSR or eventually AVN ablate/pace (see below). Would repeat echo in another 3 months or so while in NSR or after AVN ablation/pacing, and if EF still remains low, he will need LHC/RHC to assess for coronary disease as cause of cardiomyopathy.  2. Afib, persistent, difficult to control.  - Has failed tikoysn and numerous DCCV.  - If fails again, will need AV nodal ablation/BiV pacing.  3. Bicuspid AV with moderate AS and Mild AI 4. HTN 5. HLD - On statin  Past Medical History:  Diagnosis Date  . Arthritis   . Biatrial enlargement    severe  . Bicuspid aortic valve   . CHF (congestive heart failure) ([place])    JULY 2014  . Chronic renal insufficiency   . GERD (gastroesophageal reflux disease)   . History of cardioversion 12/16/2012  . History of stomach ulcers   . Hypertension   . Left bundle branch block   . Nonischemic cardiomyopathy ([place])       . Persistent atrial fibrillation ([place])    multiple prior cardioversion  . Sinus bradycardia   . Status post clamping of cerebral aneurysm 80   Current Outpatient Medications  Medication Sig Dispense Refill  . acetaminophen (TYLENOL) 500 MG tablet Take 1,000 mg by mouth every 6 (six) hours as needed for mild pain or headache.     . ALPRAZolam (XANAX) 0.5 MG tablet TAKE 1 TABLET BY MOUTH TWICE DAILY AS NEEDED FOR ANXIETY( SEPARATE BY 8 HOURS FROM TEMAZEPAM) 5 tablet 0  . amiodarone (PACERONE) 200 MG tablet Take 200 mg by mouth daily.    Marland Kitchen atorvastatin (LIPITOR) 40 MG tablet Take 1 tablet (40 mg total) by mouth once a week. (Patient taking differently: Take 40 mg by mouth every Sunday. ) 14 tablet 3  . carvedilol (COREG) 6.25 MG tablet TAKE 1 TABLET BY MOUTH TWICE DAILY WITH A MEAL 180 tablet 3  . finasteride (PROPECIA) 1 MG tablet TAKE 1 TABLET(1 MG) BY MOUTH DAILY 30 tablet 5  . hydroxypropyl methylcellulose / hypromellose (ISOPTO TEARS / GONIOVISC) 2.5 % ophthalmic solution Place 2 drops into both eyes  3 (three) times daily as needed for dry eyes.    Marland Kitchen losartan (COZAAR) 25 MG tablet Take 0.5 tablets (12.5 mg total) by mouth daily. 30 tablet 11  . meloxicam (MOBIC) 15 MG tablet TAKE 1 TABLET BY MOUTH DAILY 90 tablet 0  . omeprazole (PRILOSEC) 20 MG capsule TAKE 1 CAPSULE BY MOUTH DAILY 90 capsule 0  . Rivaroxaban (XARELTO) 15 MG TABS tablet Take 1 tablet (15 mg total) by mouth daily with supper. 60 tablet 2  . spironolactone (ALDACTONE) 25 MG tablet Take 0.5 tablets (12.5 mg total) by mouth at bedtime. 90 tablet 1  . temazepam (RESTORIL) 15 MG capsule TAKE ONE CAPSULE BY  MOUTH AT BEDTIME AS NEEDED 30 capsule 5  . torsemide (DEMADEX) 20 MG tablet Take 2 tablets (40 mg total) by mouth daily. 90 tablet 1   No current facility-administered medications for this encounter.    No Known Allergies  Social History   Socioeconomic History  . Marital status: Single    Spouse name: Not on file  . Number of children: 0  . Years of education: Not on file  . Highest education level: Not on file  Occupational History  . Occupation: Retired  Scientific laboratory technician  . Financial resource strain: Not on file  . Food insecurity:    Worry: Not on file    Inability: Not on file  . Transportation needs:    Medical: Not on file    Non-medical: Not on file  Tobacco Use  . Smoking status: Former Smoker    Packs/day: 1.50    Years: 25.00    Pack years: 37.50    Last attempt to quit: 05/26/1986    Years since quitting: 31.5  . Smokeless tobacco: Never Used  Substance and Sexual Activity  . Alcohol use: Yes    Alcohol/week: 0.6 oz    Types: 1 Standard drinks or equivalent per week    Comment: stopped drinking   . Drug use: No  . Sexual activity: Not Currently  Lifestyle  . Physical activity:    Days per week: Not on file    Minutes per session: Not on file  . Stress: Not on file  Relationships  . Social connections:    Talks on phone: Not on file    Gets together: Not on file    Attends religious service: Not on file    Active member of club or organization: Not on file    Attends meetings of clubs or organizations: Not on file    Relationship status: Not on file  . Intimate partner violence:    Fear of current or ex partner: Not on file    Emotionally abused: Not on file    Physically abused: Not on file    Forced sexual activity: Not on file  Other Topics Concern  . Not on file  Social History Narrative   Homosexual. Lives alone. Not sexually active.       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 Stroke Father 60       smoker  . Lung cancer Mother 21       former smoker  . Stroke Mother   . Stroke Brother   . Colon cancer Neg Hx    Vitals:   12/04/17 0921  BP: 110/60  Pulse: (!) 50  SpO2: 95%  Weight: 172 lb (78 kg)     Wt Readings from Last 3 Encounters:  12/04/17 172 lb (78 kg)  11/09/17 165 lb 12.8 oz (75.2 kg)  10/23/17 167 lb 9.6 oz (76 kg)    PHYSICAL EXAM: General: Well appearing. No resp difficulty. HEENT: Normal Neck: Supple. JVP 5-6. Carotids 2+ bilat; no bruits. No thyromegaly or nodule noted. Cor: PMI nondisplaced. RRR, No M/G/R noted Lungs: CTAB, normal effort. Abdomen: Soft, non-tender, non-distended, no HSM. No bruits or masses. +BS  Extremities: No cyanosis, clubbing, or rash. R and LLE no edema.  Neuro: Alert & orientedx3, cranial nerves grossly intact. moves all 4 extremities w/o difficulty. Affect pleasant   ASSESSMENT & PLAN: 1. Chronic systolic CHF: Echo 6/28 with EF 30-35%, mild to moderately decreased RV systolic function. Cause of cardiomyopathy uncertain. It is possible that this is a tachycardia-mediated or afib-mediated cardiomyopathy given poor control of atrial fibrillation.  - NYHA II symptoms - Volume status stable on exam.   - Continue torsemide 40 mg daily.  - Continue Coreg 12.5 mg bid. No room to increase with bradycardia.  - Continue spironolactone 12.5 daily. Will not increase with recent elevated K - Continue losartan 12.5 mg daily.  Will not increase with recent elevated K - Need to either keep in NSR or eventually AVN ablate/pace (see below). - Plan for repeat Echo in September. If EF remains low, he will need LHC/RHC to assess for coronary disease as cause of cardiomyopathy.  - Reinforced fluid restriction to < 2 L daily, sodium restriction to less than 2000 mg daily, and the importance of daily weights.   2. Atrial fibrillation:  Persistent, difficult to control rate and rhythm. He failed Tikosyn. He had atrial fibrillation ablation earlier this month.  - Continue amiodarone 200 mg BID per EP. Sees [name] next month.  - EKG last visit. Slow and regular today.  - Continue Xarelto and Coreg at current doses.  - If he fails to maintain NSR despite amiodarone and DCCV a few months post-PVI, will need AV nodal ablation/BiV pacing.  3. CAD: Extensive coronary calcification on recent CT chest. No chest pain. Mild troponin rise this admission likely due to demand ischemia with volume overload and afib/RVR.  -As above, if EF does not improve with NSR or ablation/BiV pacing, he will need coronary angiography eventually to rule out CAD as cause of cardiomyopathy.Will address post Echo in September.  - Continue statin.  4. CKD: Stage 3 - BMET today.  5. Aortic stenosis:  - Moderate on last echo with bicuspid valve. - No change to current plan.    Keep 2 month appt with Echo. BMET today. Sees EP next month.   Shirley Friar, PA-C 12/04/17   Greater than 50% of the 25 minute visit was spent in counseling/coordination of care regarding disease state education, salt/fluid restriction, sliding scale diuretics, and medication compliance.

## 2017-12-04 ENCOUNTER — Ambulatory Visit (HOSPITAL_COMMUNITY)
Admission: RE | Admit: 2017-12-04 | Discharge: 2017-12-04 | Disposition: A | Discharge status: Home / Self Care | Payer: Medicare Other | Source: Ambulatory Visit | Attending: Cardiology | Admitting: Cardiology

## 2017-12-04 ENCOUNTER — Telehealth (HOSPITAL_COMMUNITY): Payer: Self-pay

## 2017-12-04 ENCOUNTER — Encounter (HOSPITAL_COMMUNITY): Payer: Self-pay

## 2017-12-04 VITALS — BP 110/60 | HR 50 | Wt 172.0 lb

## 2017-12-04 DIAGNOSIS — I5022 Chronic systolic (congestive) heart failure: Secondary | ICD-10-CM | POA: Diagnosis not present

## 2017-12-04 DIAGNOSIS — E785 Hyperlipidemia, unspecified: Secondary | ICD-10-CM | POA: Diagnosis not present

## 2017-12-04 DIAGNOSIS — I350 Nonrheumatic aortic (valve) stenosis: Secondary | ICD-10-CM | POA: Diagnosis not present

## 2017-12-04 DIAGNOSIS — I481 Persistent atrial fibrillation: Secondary | ICD-10-CM | POA: Diagnosis not present

## 2017-12-04 DIAGNOSIS — Z79899 Other long term (current) drug therapy: Secondary | ICD-10-CM | POA: Diagnosis not present

## 2017-12-04 DIAGNOSIS — K219 Gastro-esophageal reflux disease without esophagitis: Secondary | ICD-10-CM | POA: Insufficient documentation

## 2017-12-04 DIAGNOSIS — I2510 Atherosclerotic heart disease of native coronary artery without angina pectoris: Secondary | ICD-10-CM | POA: Insufficient documentation

## 2017-12-04 DIAGNOSIS — Q231 Congenital insufficiency of aortic valve: Secondary | ICD-10-CM

## 2017-12-04 DIAGNOSIS — I420 Dilated cardiomyopathy: Secondary | ICD-10-CM | POA: Diagnosis not present

## 2017-12-04 DIAGNOSIS — I130 Hypertensive heart and chronic kidney disease with heart failure and stage 1 through stage 4 chronic kidney disease, or unspecified chronic kidney disease: Secondary | ICD-10-CM | POA: Insufficient documentation

## 2017-12-04 DIAGNOSIS — I4819 Other persistent atrial fibrillation: Secondary | ICD-10-CM

## 2017-12-04 DIAGNOSIS — Z7901 Long term (current) use of anticoagulants: Secondary | ICD-10-CM | POA: Insufficient documentation

## 2017-12-04 DIAGNOSIS — I447 Left bundle-branch block, unspecified: Secondary | ICD-10-CM

## 2017-12-04 DIAGNOSIS — I429 Cardiomyopathy, unspecified: Secondary | ICD-10-CM | POA: Insufficient documentation

## 2017-12-04 DIAGNOSIS — Q2381 Bicuspid aortic valve: Secondary | ICD-10-CM

## 2017-12-04 DIAGNOSIS — N183 Chronic kidney disease, stage 3 (moderate): Secondary | ICD-10-CM | POA: Diagnosis not present

## 2017-12-04 DIAGNOSIS — Z87891 Personal history of nicotine dependence: Secondary | ICD-10-CM | POA: Diagnosis not present

## 2017-12-04 LAB — BASIC METABOLIC PANEL
Anion gap: 6 (ref 5–15)
BUN: 31 mg/dL — ABNORMAL HIGH (ref 8–23)
CO2: 26 mmol/L (ref 22–32)
Calcium: 8.8 mg/dL — ABNORMAL LOW (ref 8.9–10.3)
Chloride: 100 mmol/L (ref 98–111)
Creatinine, Ser: 1.38 mg/dL — ABNORMAL HIGH (ref 0.61–1.24)
GFR calc non Af Amer: 48 mL/min — ABNORMAL LOW (ref >60)
GFR, EST AFRICAN AMERICAN: 55 mL/min — AB (ref >60)
Glucose, Bld: 92 mg/dL (ref 70–99)
POTASSIUM: 5.2 mmol/L — AB (ref 3.5–5.1)
Sodium: 132 mmol/L — ABNORMAL LOW (ref 135–145)

## 2017-12-04 NOTE — Telephone Encounter (Signed)
Result Notes for Basic metabolic panel   Notes recorded by Effie Berkshire, RN on 12/04/2017 at 12:01 PM EDT LVMTCB. ------  Notes recorded by Shirley Friar, PA-C on 12/04/2017 at 10:27 AM EDT  K remains elevated. Will have to stop spiro for now and recheck 7-10 days. May need valtessa to further titrate meds.    Legrand Como [address] [place], PA-C 12/04/2017 10:27 AM

## 2017-12-04 NOTE — Patient Instructions (Signed)
Routine lab work today. Will notify you of abnormal results, otherwise no news is good news!  No changes to medication at this time.  Follow up as scheduled.  Take all medication as prescribed the day of your appointment. Bring all medications with you to your appointment.  Do the following things EVERYDAY: 1) Weigh yourself in the morning before breakfast. Write it down and keep it in a log. 2) Take your medicines as prescribed 3) Eat low salt foods-Limit salt (sodium) to 2000 mg per day.  4) Stay as active as you can everyday 5) Limit all fluids for the day to less than 2 liters

## 2017-12-07 ENCOUNTER — Telehealth (HOSPITAL_COMMUNITY): Payer: Self-pay

## 2017-12-07 DIAGNOSIS — I5022 Chronic systolic (congestive) heart failure: Secondary | ICD-10-CM

## 2017-12-07 NOTE — Telephone Encounter (Signed)
Result Notes for Basic metabolic panel   Notes recorded by Effie Berkshire, RN on 12/07/2017 at 10:30 AM EDT Patient states he did stop spiro last Friday after receiving phone call with instructions. Lab appt made for this Friday to reassess serum K. ------  Notes recorded by Effie Berkshire, RN on 12/04/2017 at 12:01 PM EDT LVMTCB. ------  Notes recorded by Shirley Friar, PA-C on 12/04/2017 at 10:27 AM EDT  K remains elevated. Will have to stop spiro for now and recheck 7-10 days. May need valtessa to further titrate meds.    Legrand Como [address]" [place], PA-C 12/04/2017 10:27 AM

## 2017-12-11 ENCOUNTER — Ambulatory Visit (HOSPITAL_COMMUNITY)
Admission: RE | Admit: 2017-12-11 | Discharge: 2017-12-11 | Disposition: A | Discharge status: Home / Self Care | Payer: Medicare Other | Source: Ambulatory Visit | Attending: Cardiology | Admitting: Cardiology

## 2017-12-11 DIAGNOSIS — I5022 Chronic systolic (congestive) heart failure: Secondary | ICD-10-CM | POA: Insufficient documentation

## 2017-12-11 LAB — BASIC METABOLIC PANEL
ANION GAP: 8 (ref 5–15)
BUN: 24 mg/dL — ABNORMAL HIGH (ref 8–23)
CALCIUM: 8.9 mg/dL (ref 8.9–10.3)
CHLORIDE: 95 mmol/L — AB (ref 98–111)
CO2: 28 mmol/L (ref 22–32)
Creatinine, Ser: 1.4 mg/dL — ABNORMAL HIGH (ref 0.61–1.24)
GFR calc Af Amer: 54 mL/min — ABNORMAL LOW (ref >60)
GFR, EST NON AFRICAN AMERICAN: 47 mL/min — AB (ref >60)
Glucose, Bld: 104 mg/dL — ABNORMAL HIGH (ref 70–99)
Potassium: 5.2 mmol/L — ABNORMAL HIGH (ref 3.5–5.1)
Sodium: 131 mmol/L — ABNORMAL LOW (ref 135–145)

## 2017-12-19 ENCOUNTER — Other Ambulatory Visit: Payer: Self-pay | Admitting: Nurse Practitioner

## 2017-12-21 ENCOUNTER — Telehealth: Payer: Self-pay

## 2017-12-21 NOTE — Telephone Encounter (Signed)
[employer] Rep came in to pick up previously dropped off forms for the patient and his O2 that he was sent home on from the hospital.. He had extra form with him, we filled it out and [name] signed. Future forms to go to PMD...copy sent to medical records.

## 2017-12-23 ENCOUNTER — Other Ambulatory Visit: Payer: Self-pay | Admitting: Nurse Practitioner

## 2017-12-23 NOTE — Telephone Encounter (Signed)
This is a A-Fib pt. Travis [name] prescribed this medication. Please address

## 2017-12-30 ENCOUNTER — Ambulatory Visit (HOSPITAL_COMMUNITY)
Admission: RE | Admit: 2017-12-30 | Discharge: 2017-12-30 | Disposition: A | Discharge status: Home / Self Care | Payer: Medicare Other | Source: Ambulatory Visit | Attending: Internal Medicine | Admitting: Internal Medicine

## 2017-12-30 DIAGNOSIS — I5022 Chronic systolic (congestive) heart failure: Secondary | ICD-10-CM | POA: Diagnosis not present

## 2017-12-30 LAB — BASIC METABOLIC PANEL
Anion gap: 10 (ref 5–15)
BUN: 30 mg/dL — ABNORMAL HIGH (ref 8–23)
CALCIUM: 8.8 mg/dL — AB (ref 8.9–10.3)
CHLORIDE: 97 mmol/L — AB (ref 98–111)
CO2: 26 mmol/L (ref 22–32)
CREATININE: 1.6 mg/dL — AB (ref 0.61–1.24)
GFR calc non Af Amer: 40 mL/min — ABNORMAL LOW (ref >60)
GFR, EST AFRICAN AMERICAN: 46 mL/min — AB (ref >60)
GLUCOSE: 120 mg/dL — AB (ref 70–99)
Potassium: 4.1 mmol/L (ref 3.5–5.1)
Sodium: 133 mmol/L — ABNORMAL LOW (ref 135–145)

## 2018-01-09 ENCOUNTER — Other Ambulatory Visit: Payer: Self-pay | Admitting: Family Medicine

## 2018-01-11 ENCOUNTER — Encounter: Payer: Self-pay | Admitting: Internal Medicine

## 2018-01-11 ENCOUNTER — Ambulatory Visit (INDEPENDENT_AMBULATORY_CARE_PROVIDER_SITE_OTHER): Payer: Medicare Other | Admitting: Internal Medicine

## 2018-01-11 VITALS — BP 128/60 | HR 52 | Ht 68.0 in | Wt 173.0 lb

## 2018-01-11 DIAGNOSIS — I428 Other cardiomyopathies: Secondary | ICD-10-CM | POA: Diagnosis not present

## 2018-01-11 DIAGNOSIS — I481 Persistent atrial fibrillation: Secondary | ICD-10-CM

## 2018-01-11 DIAGNOSIS — I4819 Other persistent atrial fibrillation: Secondary | ICD-10-CM

## 2018-01-11 DIAGNOSIS — I6523 Occlusion and stenosis of bilateral carotid arteries: Secondary | ICD-10-CM

## 2018-01-11 NOTE — Patient Instructions (Addendum)
Medication Instructions:  Your physician recommends that you continue on your current medications as directed. Please refer to the Current Medication list given to you today.  Labwork: None ordered.  Testing/Procedures: None ordered.  Follow-Up: Your physician wants you to follow-up in: 3 months with [name].      Any Other Special Instructions Will Be Listed Below (If Applicable).  If you need a refill on your cardiac medications before your next appointment, please call your pharmacy.   

## 2018-01-11 NOTE — Progress Notes (Signed)
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  Since his recent afib ablation, the patient reports doing reasonably well.  He continues to have fatigue.  He has had no further AF since cardioversion early post ablation.  he denies procedure related complications and is pleased with the results of the procedure.  Today, he denies symptoms of palpitations, chest pain, shortness of breath,  lower extremity edema, dizziness, presyncope, or syncope.  The patient is otherwise without complaint today.   Past Medical History:  Diagnosis Date  . Arthritis   . Biatrial enlargement    severe  . Bicuspid aortic valve   . CHF (congestive heart failure) ([place])    JULY 2014  . Chronic renal insufficiency   . GERD (gastroesophageal reflux disease)   . History of cardioversion 12/16/2012  . History of stomach ulcers   . Hypertension   . Left bundle branch block   . Nonischemic cardiomyopathy ([place])       . Persistent atrial fibrillation ([place])    multiple prior cardioversion  . Sinus bradycardia   . Status post clamping of cerebral aneurysm 80   Past Surgical History:  Procedure Laterality Date  . ATRIAL FIBRILLATION ABLATION N/A 10/06/2017   Procedure: ATRIAL FIBRILLATION ABLATION;  Surgeon: [name], MD;  Location: [facility];  Service: Cardiovascular;  Laterality: N/A;  . CARDIAC CATHETERIZATION    . CARDIOVERSION N/A 12/16/2012   Procedure: CARDIOVERSION;  Surgeon: Lelon Perla, MD;  Location: [facility]COPY;  Service: Cardiovascular;  Laterality: N/A;  . CARDIOVERSION N/A 08/14/2017   Procedure: CARDIOVERSION;  Surgeon: Josue Hector, MD;  Location: [facility] ENDOSCOPY;  Service: Cardiovascular;  Laterality: N/A;  . CARDIOVERSION N/A 09/21/2017   Procedure: CARDIOVERSION;  Surgeon: Sanda Klein, MD;  Location: [facility] ENDOSCOPY;  Service: Cardiovascular;  Laterality: N/A;  . CARDIOVERSION N/A  10/13/2017   Procedure: CARDIOVERSION;  Surgeon: Josue Hector, MD;  Location: [facility];  Service: Cardiovascular;  Laterality: N/A;  . CATARACT EXTRACTION  05/2015   bilateral  . cerebral anuersym post clips    . fractured left arm    . HERNIA REPAIR     lft  . INGUINAL HERNIA REPAIR  07/02/2011   Procedure: LAPAROSCOPIC INGUINAL HERNIA;  Surgeon: Harl Bowie, MD;  Location: [facility];  Service: General;  Laterality: Left;  Laparoscopic left inguinal hernia repair and mesh  . left tendon repair     lft foot  . ROTATOR CUFF REPAIR     lf  . TEE WITHOUT CARDIOVERSION N/A 08/14/2017   Procedure: TRANSESOPHAGEAL ECHOCARDIOGRAM (TEE);  Surgeon: Josue Hector, MD;  Location: [facility] ENDOSCOPY;  Service: Cardiovascular;  Laterality: N/A;  . TONSILLECTOMY    . TOTAL KNEE ARTHROPLASTY Bilateral 02/06/2014   Procedure: TOTAL KNEE BILATERAL;  Surgeon: Mauri Pole, MD;  Location: [facility];  Service: Orthopedics;  Laterality: Bilateral;  . WRIST GANGLION EXCISION     lft    ROS- all systems are personally reviewed and negatives except as per HPI above  Current Outpatient Medications  Medication Sig Dispense Refill  . acetaminophen (TYLENOL) 500 MG tablet Take 1,000 mg by mouth every 6 (six) hours as needed for mild pain or headache.     . ALPRAZolam (XANAX) 0.5 MG tablet TAKE 1 TABLET BY MOUTH TWICE DAILY AS NEEDED FOR ANXIETY( SEPARATE BY 8 HOURS FROM TEMAZEPAM) 5 tablet 0  . amiodarone (  PACERONE) 200 MG tablet TAKE 1 TABLET BY MOUTH DAILY 90 tablet 2  . atorvastatin (LIPITOR) 40 MG tablet Take 40 mg by mouth once a week.    . carvedilol (COREG) 6.25 MG tablet TAKE 1 TABLET BY MOUTH TWICE DAILY WITH A MEAL 180 tablet 3  . finasteride (PROPECIA) 1 MG tablet TAKE 1 TABLET(1 MG) BY MOUTH DAILY 30 tablet 5  . hydroxypropyl methylcellulose / hypromellose (ISOPTO TEARS / GONIOVISC) 2.5 % ophthalmic solution Place 2 drops into both eyes 3 (three) times daily as needed for dry eyes.    Marland Kitchen losartan  (COZAAR) 25 MG tablet Take 0.5 tablets (12.5 mg total) by mouth daily. 30 tablet 11  . meloxicam (MOBIC) 15 MG tablet TAKE 1 TABLET BY MOUTH DAILY 90 tablet 0  . omeprazole (PRILOSEC) 20 MG capsule TAKE 1 CAPSULE BY MOUTH DAILY 90 capsule 0  . Rivaroxaban (XARELTO) 15 MG TABS tablet Take 1 tablet (15 mg total) by mouth daily with supper. 60 tablet 2  . temazepam (RESTORIL) 15 MG capsule TAKE ONE CAPSULE BY MOUTH AT BEDTIME AS NEEDED 30 capsule 5  . torsemide (DEMADEX) 20 MG tablet TAKE 2 TABLETS(40 MG) BY MOUTH DAILY 90 tablet 2   No current facility-administered medications for this visit.     Physical Exam: Vitals:   01/11/18 1141  BP: 128/60  Pulse: (!) 52  SpO2: 97%  Weight: 173 lb (78.5 kg)  Height: 5\' 8"  (1.727 m)    GEN- The patient is well appearing, alert and oriented x 3 today.   Head- normocephalic, atraumatic Eyes-  Sclera clear, conjunctiva pink Ears- hearing intact Oropharynx- clear Lungs- Clear to ausculation bilaterally, normal work of breathing Heart- bradycardic regular rhythm, no murmurs, rubs or gallops, PMI not laterally displaced GI- soft, NT, ND, + BS Extremities- no clubbing, cyanosis, or edema  EKG tracing ordered today is personally reviewed and shows sinus rhtyhm 52 bpm, LBBB  Assessment and Plan:  1. Persistent atrial fibrillation Doing well s/p ablation chads2vasc score is 4.  Continue long term anticoagulation He is reluctance to reduce amiodarone to 100mg  daily at this time.  We will therefore continue current dosing for 3 months. He has severe RA enlargement and therefore carries significant risk of AF recurrence.  Should he have additional difficulty with refractory afib, I would advise av nodal ablation with CRT by Dr Caryl Comes rather than repeat ablation.  2. Nonischemic CM Hopefully EF will improve with sinus rhythm Reassess EF in September  Return to see me in 3 months  [name] MD, [facility] 01/11/2018 12:07 PM

## 2018-01-26 ENCOUNTER — Telehealth (HOSPITAL_COMMUNITY): Payer: Self-pay | Admitting: *Deleted

## 2018-01-26 MED ORDER — AMIODARONE HCL 200 MG PO TABS: 100.0000 mg | ORAL_TABLET | Freq: Every day | ORAL | 2 refills | Status: DC

## 2018-01-26 NOTE — Telephone Encounter (Signed)
Patient called in stating he feels as though he is having a reaction to amiodarone over the last 2 weeks --  difficulty walking/using arms. Discussed previous recommendation to reduce Amiodarone per [name] visit on 8/19. Pt in agreement to reduce Amiodarone to 100mg  once a day - he has follow up with [name] in a few weeks. Will see how he is at that visit. Pt in agreement with plan.

## 2018-02-03 NOTE — Progress Notes (Signed)
Subjective:   Travis [name] is a 77 y.o. male who presents for Medicare Annual/Subsequent preventive examination.  Reports health as fair Not feeling great Went to the heart doctor this am  Feels very tired; can't lift arms; fatigue   Cut in that 1/2 as of this am   S/p cerebral aneurysm in 28'  Cardioversion 10/13/2017 had a breathing problem and goes to the [facility]   Last ov 10/09/2017    Diet  BMI 26.5  Gained 8lbs Breakfast coffee, juice and granola bar Lunch lean cusine  Dinner salad, eats well  Eats a lot of fruit   BMI 27  Exercise trainer 3 times a week  Cardio and weights  and walks at intervals; walks on the treadmill  2019 can not exercise this year; tried all summer and just can't exercise   he can walk but takes him awhile   Hearing Screening Comments: No hearing issues  Vision Screening Comments: Vision checked x 1 per year Dr. Gershon Crane  Very early stages of MD  Still watching this   Sleeps does not sleep as well  Takes med but wakes up 1 or 2:00 Does not    Tobacco; 37.5 pack years but quit [place] Maintenance Due  Topic Date Due  . TETANUS/TDAP  04/17/2014  . INFLUENZA VACCINE  12/24/2017   Has had the shingrix vaccine No issues   Will take your high does flu vaccine today  Will take his tetanus if needed due to dirty laceration of wound or at the pharmacy   Spoke with GI and states he does not plan to schedule any more colonoscopies        Objective:    Vitals: BP 118/70   Pulse (!) 51   Ht 5\' 8"  (1.727 m)   Wt 174 lb (78.9 kg)   SpO2 99%   BMI 26.46 kg/m   Body mass index is 26.46 kg/m.  Advanced Directives 10/12/2017 10/06/2017 09/17/2017 08/13/2017 08/12/2017 02/03/2017 02/08/2014  Does Patient Have a Medical Advance Directive? Yes Yes Yes No No Yes Yes  Type of Industrial/product designer of [place];Living will - - - [place];Living will  Does patient want to make changes to medical advance directive? No - Patient declined No - Patient declined No - Patient declined - - - No - Patient declined  Copy of [place] in Chart? No - copy requested No - copy requested No - copy requested - - - No - copy requested  Would patient like information on creating a medical advance directive? - - - No - Patient declined - - -  Pre-existing out of facility DNR order (yellow form or pink MOST form) - - - - - - -    Tobacco Social History   Tobacco Use  Smoking Status Former Smoker  . Packs/day: 1.50  . Years: 25.00  . Pack years: 37.50  . Last attempt to quit: 05/26/1986  . Years since quitting: 31.7  Smokeless Tobacco Never Used     Counseling given: Not Answered   Clinical Intake:     Past Medical History:  Diagnosis Date  . Arthritis   . Biatrial enlargement    severe  . Bicuspid aortic valve   . CHF (congestive heart failure) ([place])    JULY 2014  . Chronic renal insufficiency   . GERD (gastroesophageal reflux disease)   .  History of cardioversion 12/16/2012  . History of stomach ulcers   . Hypertension   . Left bundle branch block   . Nonischemic cardiomyopathy ([place])       . Persistent atrial fibrillation ([place])    multiple prior cardioversion  . Sinus bradycardia   . Status post clamping of cerebral aneurysm 79   Past Surgical History:  Procedure Laterality Date  . ATRIAL FIBRILLATION ABLATION N/A 10/06/2017   Procedure: ATRIAL FIBRILLATION ABLATION;  Surgeon: [name], MD;  Location: [place] [facility];  Service: Cardiovascular;  Laterality: N/A;  . CARDIAC CATHETERIZATION    . CARDIOVERSION N/A 12/16/2012   Procedure: CARDIOVERSION;  Surgeon: Lelon Perla, MD;  Location: [facility] ENDOSCOPY;  Service: Cardiovascular;  Laterality: N/A;  . CARDIOVERSION N/A 08/14/2017   Procedure: CARDIOVERSION;  Surgeon: Josue Hector, MD;  Location: [facility] ENDOSCOPY;  Service:  Cardiovascular;  Laterality: N/A;  . CARDIOVERSION N/A 09/21/2017   Procedure: CARDIOVERSION;  Surgeon: Sanda Klein, MD;  Location: [facility];  Service: Cardiovascular;  Laterality: N/A;  . CARDIOVERSION N/A 10/13/2017   Procedure: CARDIOVERSION;  Surgeon: Josue Hector, MD;  Location: [facility];  Service: Cardiovascular;  Laterality: N/A;  . CATARACT EXTRACTION  05/2015   bilateral  . cerebral anuersym post clips    . fractured left arm    . HERNIA REPAIR     lft  . INGUINAL HERNIA REPAIR  07/02/2011   Procedure: LAPAROSCOPIC INGUINAL HERNIA;  Surgeon: Harl Bowie, MD;  Location: [facility];  Service: General;  Laterality: Left;  Laparoscopic left inguinal hernia repair and mesh  . left tendon repair     lft foot  . ROTATOR CUFF REPAIR     lf  . TEE WITHOUT CARDIOVERSION N/A 08/14/2017   Procedure: TRANSESOPHAGEAL ECHOCARDIOGRAM (TEE);  Surgeon: Josue Hector, MD;  Location[facility] END[facility]COPY;  Service: Cardiovascular;  Laterality: N/A;  . TONSILLECTOMY    . TOTAL KNEE ARTHROPLASTY Bilateral 02/06/2014   Procedure: TOTAL KNEE BILATERAL;  Surgeon: Mauri Pole, MD;  Location: [facility];  Service: Orthopedics;  Laterality: Bilateral;  . WRIST GANGLION EXCISION     lft   Family History  Problem Relation Age of Onset  . Stroke Father 49       smoker  . Lung cancer Mother 42       former smoker  . Stroke Mother   . Stroke Brother   . Colon cancer Neg Hx    Social History   Socioeconomic History  . Marital status: Single    Spouse name: Not on file  . Number of children: 0  . Years of education: Not on file  . Highest education level: Not on file  Occupational History  . Occupation: Retired  Scientific laboratory technician  . Financial resource strain: Not on file  . Food insecurity:    Worry: Not on file    Inability: Not on file  . Transportation needs:    Medical: Not on file    Non-medical: Not on file  Tobacco Use  . Smoking status: Former Smoker    Packs/day: 1.50    Years:  25.00    Pack years: 37.50    Last attempt to quit: 05/26/1986    Years since quitting: 31.7  . Smokeless tobacco: Never Used  Substance and Sexual Activity  . Alcohol use: Yes    Alcohol/week: 1.0 standard drinks    Types: 1 Standard drinks or equivalent per week    Comment: stopped drinking   .  Drug use: No  . Sexual activity: Not Currently  Lifestyle  . Physical activity:    Days per week: Not on file    Minutes per session: Not on file  . Stress: Not on file  Relationships  . Social connections:    Talks on phone: Not on file    Gets together: Not on file    Attends religious service: Not on file    Active member of club or organization: Not on file    Attends meetings of clubs or organizations: Not on file    Relationship status: Not on file  Other Topics Concern  . Not on file  Social History Narrative   Homosexual. Lives alone. Not sexually active.       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 acetaminophen (TYLENOL) 500 MG tablet Take 1,000 mg by mouth every 6 (six) hours as needed for mild pain or headache.   . ALPRAZolam (XANAX) 0.5 MG tablet TAKE 1 TABLET BY MOUTH TWICE DAILY AS NEEDED FOR ANXIETY( SEPARATE BY 8 HOURS FROM TEMAZEPAM)  . amiodarone (PACERONE) 200 MG tablet Take 0.5 tablets (100 mg total) by mouth daily.  Marland Kitchen atorvastatin (LIPITOR) 10 MG tablet Take 1 tablet (10 mg total) by mouth daily.  . carvedilol (COREG) 3.125 MG tablet Take 1 tablet (3.125 mg total) by mouth 2 (two) times daily with a meal.  . finasteride (PROPECIA) 1 MG tablet TAKE 1 TABLET(1 MG) BY MOUTH DAILY  . hydroxypropyl methylcellulose / hypromellose (ISOPTO TEARS / GONIOVISC) 2.5 % ophthalmic solution Place 2 drops into both eyes 3 (three) times daily as needed for dry eyes.  Marland Kitchen losartan (COZAAR) 25 MG tablet Take 0.5 tablets  (12.5 mg total) by mouth daily.  . meloxicam (MOBIC) 15 MG tablet TAKE 1 TABLET BY MOUTH DAILY  . omeprazole (PRILOSEC) 20 MG capsule TAKE 1 CAPSULE BY MOUTH DAILY  . Rivaroxaban (XARELTO) 15 MG TABS tablet Take 1 tablet (15 mg total) by mouth daily with supper.  . temazepam (RESTORIL) 15 MG capsule TAKE ONE CAPSULE BY MOUTH AT BEDTIME AS NEEDED  . torsemide (DEMADEX) 20 MG tablet Take 2 tabs in AM and 1 tab in PM  . [DISCONTINUED] atorvastatin (LIPITOR) 40 MG tablet Take 40 mg by mouth once a week.  . [DISCONTINUED] carvedilol (COREG) 6.25 MG tablet TAKE 1 TABLET BY MOUTH TWICE DAILY WITH A MEAL  . [DISCONTINUED] torsemide (DEMADEX) 20 MG tablet TAKE 2 TABLETS(40 MG) BY MOUTH DAILY   No facility-administered encounter medications on file as of 02/04/2018.     Activities of Daily Living In your present state of health, do you have any difficulty performing the following activities: 10/12/2017 09/17/2017  Hearing? N -  Vision? N -  Difficulty concentrating or making decisions? N -  Walking or climbing stairs? Y -  Comment - -  Dressing or bathing? N -  Doing errands, shopping? N N  Some recent data might be hidden    Patient Care Team: [name], MD as PCP - General (Family Medicine) Deboraha Sprang, MD as PCP - Cardiology (Cardiology)   Assessment:   This is a routine wellness examination for Ramiz.  Exercise Activities and Dietary recommendations    Goals    . patient     Will maintain his health      .  Patient Stated      Mr. Heard , Thank you for taking time to come for your Medicare Wellness Visit. I appreciate your ongoing commitment to your health goals. Please review the following plan we discussed and let me know if I can assist you in the future.   These are the goals we discussed: Goals    . patient     Will maintain his health         This is a list of the screening recommended for you and due dates:  Health Maintenance  Topic Date Due  . Tetanus  Vaccine  04/17/2014  . Flu Shot  12/24/2017  . Pneumonia vaccines  Completed           Fall Risk Fall Risk  02/03/2017 02/01/2016 01/29/2016 12/19/2014 02/09/2013  Falls in the past year? No No No No No  Comment - - Emmi Telephone Survey: data to providers prior to load - -     Depression Screen PHQ 2/9 Scores 09/29/2017 02/03/2017 02/01/2016 12/19/2014  PHQ - 2 Score 1 0 0 0  PHQ- 9 Score 4 - - -    Cognitive Function Ad8 score reviewed for issues:  Issues making decisions:  Less interest in hobbies / activities:  Repeats questions, stories (family complaining):  Trouble using ordinary gadgets (microwave, computer, phone):  Forgets the month or year:   Mismanaging finances:   Remembering appts:  Daily problems with thinking and/or memory: Ad8 score is=0         Immunization History  Administered Date(s) Administered  . Influenza Split 03/04/2011, 02/04/2012  . Influenza Whole 04/17/2004, 03/31/2007, 02/17/2008, 04/12/2009, 03/13/2010  . Influenza, High Dose Seasonal PF 03/17/2016, 02/03/2017  . Influenza,inj,Quad PF,6+ Mos 02/09/2013, 03/06/2014, 01/30/2015  . Pneumococcal Conjugate-13 12/19/2014  . Pneumococcal Polysaccharide-23 04/12/2009  . Td 04/17/2004  . Zoster 09/25/2010  . Zoster Recombinat (Shingrix) 09/22/2016, 11/22/2016      Screening Tests Health Maintenance  Topic Date Due  . TETANUS/TDAP  04/17/2014  . INFLUENZA VACCINE  12/24/2017  . PNA vac Low Risk Adult  Completed         Plan:      PCP Notes   Health Maintenance Has had the shingrix vaccine No issues   Will take your high does flu vaccine today  Will take his tetanus if needed due to dirty laceration of wound or at the pharmacy   Spoke with GI and states he does not plan to schedule any more colonoscopies      Abnormal Screens  None; but complains of fatigue  Cardiology decreased amiodarone to 1/2 to see if he feels better Cannot exercise due to  fatigue.   Referrals  None  Patient concerns; Feeling better as noted   Nurse Concerns; As noted  Next PCP apt 02/06/2018       I have personally reviewed and noted the following in the patient's chart:   . Medical and social history . Use of alcohol, tobacco or illicit drugs  . Current medications and supplements . Functional ability and status . Nutritional status . Physical activity . Advanced directives . List of other physicians . Hospitalizations, surgeries, and ER visits in previous 12 months . Vitals . Screenings to include cognitive, depression, and falls . Referrals and appointments  In addition, I have reviewed and discussed with patient certain preventive protocols, quality metrics, and best practice recommendations. A written personalized care plan for preventive services as well as general preventive health recommendations were provided  to patient.     Wynetta Fines, RN  02/04/2018

## 2018-02-04 ENCOUNTER — Ambulatory Visit (INDEPENDENT_AMBULATORY_CARE_PROVIDER_SITE_OTHER): Payer: Medicare Other

## 2018-02-04 ENCOUNTER — Ambulatory Visit (HOSPITAL_BASED_OUTPATIENT_CLINIC_OR_DEPARTMENT_OTHER)
Admission: RE | Admit: 2018-02-04 | Discharge: 2018-02-04 | Disposition: A | Discharge status: Home / Self Care | Payer: Medicare Other | Source: Ambulatory Visit | Attending: Cardiology | Admitting: Cardiology

## 2018-02-04 ENCOUNTER — Ambulatory Visit (HOSPITAL_COMMUNITY)
Admission: RE | Admit: 2018-02-04 | Discharge: 2018-02-04 | Disposition: A | Discharge status: Home / Self Care | Payer: Medicare Other | Source: Ambulatory Visit | Attending: Student | Admitting: Student

## 2018-02-04 VITALS — BP 114/60 | HR 49 | Wt 178.2 lb

## 2018-02-04 DIAGNOSIS — Q231 Congenital insufficiency of aortic valve: Secondary | ICD-10-CM | POA: Insufficient documentation

## 2018-02-04 DIAGNOSIS — E785 Hyperlipidemia, unspecified: Secondary | ICD-10-CM | POA: Diagnosis not present

## 2018-02-04 DIAGNOSIS — I080 Rheumatic disorders of both mitral and aortic valves: Secondary | ICD-10-CM | POA: Insufficient documentation

## 2018-02-04 DIAGNOSIS — I5022 Chronic systolic (congestive) heart failure: Secondary | ICD-10-CM | POA: Diagnosis not present

## 2018-02-04 DIAGNOSIS — Z0000 Encounter for general adult medical examination without abnormal findings: Secondary | ICD-10-CM

## 2018-02-04 DIAGNOSIS — I481 Persistent atrial fibrillation: Secondary | ICD-10-CM | POA: Insufficient documentation

## 2018-02-04 DIAGNOSIS — I2510 Atherosclerotic heart disease of native coronary artery without angina pectoris: Secondary | ICD-10-CM | POA: Diagnosis not present

## 2018-02-04 DIAGNOSIS — R000 Tachycardia, unspecified: Secondary | ICD-10-CM | POA: Insufficient documentation

## 2018-02-04 DIAGNOSIS — N183 Chronic kidney disease, stage 3 (moderate): Secondary | ICD-10-CM | POA: Diagnosis not present

## 2018-02-04 DIAGNOSIS — Z79899 Other long term (current) drug therapy: Secondary | ICD-10-CM | POA: Insufficient documentation

## 2018-02-04 DIAGNOSIS — Z87891 Personal history of nicotine dependence: Secondary | ICD-10-CM | POA: Diagnosis not present

## 2018-02-04 DIAGNOSIS — Z23 Encounter for immunization: Secondary | ICD-10-CM | POA: Diagnosis not present

## 2018-02-04 DIAGNOSIS — R5383 Other fatigue: Secondary | ICD-10-CM | POA: Diagnosis not present

## 2018-02-04 DIAGNOSIS — Z9889 Other specified postprocedural states: Secondary | ICD-10-CM | POA: Insufficient documentation

## 2018-02-04 DIAGNOSIS — Z7901 Long term (current) use of anticoagulants: Secondary | ICD-10-CM | POA: Insufficient documentation

## 2018-02-04 DIAGNOSIS — Z8711 Personal history of peptic ulcer disease: Secondary | ICD-10-CM | POA: Insufficient documentation

## 2018-02-04 DIAGNOSIS — I447 Left bundle-branch block, unspecified: Secondary | ICD-10-CM | POA: Insufficient documentation

## 2018-02-04 DIAGNOSIS — I130 Hypertensive heart and chronic kidney disease with heart failure and stage 1 through stage 4 chronic kidney disease, or unspecified chronic kidney disease: Secondary | ICD-10-CM | POA: Diagnosis not present

## 2018-02-04 DIAGNOSIS — I4819 Other persistent atrial fibrillation: Secondary | ICD-10-CM

## 2018-02-04 LAB — CBC
HCT: 25.7 % — ABNORMAL LOW (ref 39.0–52.0)
Hemoglobin: 7.8 g/dL — ABNORMAL LOW (ref 13.0–17.0)
MCH: 28.7 pg (ref 26.0–34.0)
MCHC: 30.4 g/dL (ref 30.0–36.0)
MCV: 94.5 fL (ref 78.0–100.0)
Platelets: 300 10*3/uL (ref 150–400)
RBC: 2.72 MIL/uL — ABNORMAL LOW (ref 4.22–5.81)
RDW: 15.8 % — AB (ref 11.5–15.5)
WBC: 7.6 10*3/uL (ref 4.0–10.5)

## 2018-02-04 LAB — COMPREHENSIVE METABOLIC PANEL
ALT: 19 U/L (ref 0–44)
AST: 22 U/L (ref 15–41)
Albumin: 3.6 g/dL (ref 3.5–5.0)
Alkaline Phosphatase: 87 U/L (ref 38–126)
Anion gap: 8 (ref 5–15)
BILIRUBIN TOTAL: 0.8 mg/dL (ref 0.3–1.2)
BUN: 32 mg/dL — AB (ref 8–23)
CO2: 24 mmol/L (ref 22–32)
CREATININE: 1.66 mg/dL — AB (ref 0.61–1.24)
Calcium: 8.7 mg/dL — ABNORMAL LOW (ref 8.9–10.3)
Chloride: 99 mmol/L (ref 98–111)
GFR, EST AFRICAN AMERICAN: 44 mL/min — AB (ref >60)
GFR, EST NON AFRICAN AMERICAN: 38 mL/min — AB (ref >60)
Glucose, Bld: 131 mg/dL — ABNORMAL HIGH (ref 70–99)
Potassium: 4.7 mmol/L (ref 3.5–5.1)
Sodium: 131 mmol/L — ABNORMAL LOW (ref 135–145)
TOTAL PROTEIN: 6.7 g/dL (ref 6.5–8.1)

## 2018-02-04 LAB — TSH: TSH: 4.034 u[IU]/mL (ref 0.350–4.500)

## 2018-02-04 LAB — LIPID PANEL
Cholesterol: 170 mg/dL (ref 0–200)
HDL: 56 mg/dL (ref >40)
LDL CALC: 104 mg/dL — AB (ref 0–99)
TRIGLYCERIDES: 50 mg/dL (ref <150)
Total CHOL/HDL Ratio: 3 RATIO
VLDL: 10 mg/dL (ref 0–40)

## 2018-02-04 MED ORDER — CARVEDILOL 3.125 MG PO TABS: 3.1250 mg | ORAL_TABLET | Freq: Two times a day (BID) | ORAL | 3 refills | Status: DC

## 2018-02-04 MED ORDER — TORSEMIDE 20 MG PO TABS: ORAL_TABLET | ORAL | 2 refills | Status: DC

## 2018-02-04 MED ORDER — ATORVASTATIN CALCIUM 10 MG PO TABS: 10.0000 mg | ORAL_TABLET | Freq: Every day | ORAL | 6 refills | Status: DC

## 2018-02-04 NOTE — Patient Instructions (Addendum)
[name] , Thank you for taking time to come for your Medicare Wellness Visit. I appreciate your ongoing commitment to your health goals. Please review the following plan we discussed and let me know if I can assist you in the future.    A Tetanus is recommended every 10 years. Medicare covers a tetanus if you have a cut or wound; otherwise, there may be a charge. If you had not had a tetanus with pertusses, known as the Tdap, you can take this anytime.   Keep in mind the flu shot is an inactivated vaccine and takes at least 2 weeks to build immunity. The flu virus can be dormant for 4 days prior to symptoms Taking the flu shot at the beginning of the season can reduce the risk for the entire community.    Health Maintenance Due  Topic Date Due  . TETANUS/TDAP  04/17/2014  . INFLUENZA VACCINE  12/24/2017    Health Maintenance, Male A healthy lifestyle and preventive care is important for your health and wellness. Ask your health care provider about what schedule of regular examinations is right for you. What should I know about weight and diet? Eat a Healthy Diet  Eat plenty of vegetables, fruits, whole grains, low-fat dairy products, and lean protein.  Do not eat a lot of foods high in solid fats, added sugars, or salt.  Maintain a Healthy Weight Regular exercise can help you achieve or maintain a healthy weight. You should:  Do at least 150 minutes of exercise each week. The exercise should increase your heart rate and make you sweat (moderate-intensity exercise).  Do strength-training exercises at least twice a week.  Watch Your Levels of Cholesterol and Blood Lipids  Have your blood tested for lipids and cholesterol every 5 years starting at 78 years of age. If you are at high risk for heart disease, you should start having your blood tested when you are 78 years old. You may need to have your cholesterol levels checked more often if: ? Your lipid or cholesterol levels  are high. ? You are older than 78 years of age. ? You are at high risk for heart disease.  What should I know about cancer screening? Many types of cancers can be detected early and may often be prevented. Lung Cancer  You should be screened every year for lung cancer if: ? You are a current smoker who has smoked for at least 30 years. ? You are a former smoker who has quit within the past 15 years.  Talk to your health care provider about your screening options, when you should start screening, and how often you should be screened.  Colorectal Cancer  Routine colorectal cancer screening usually begins at 78 years of age and should be repeated every 5-10 years until you are 78 years old. You may need to be screened more often if early forms of precancerous polyps or small growths are found. Your health care provider may recommend screening at an earlier age if you have risk factors for colon cancer.  Your health care provider may recommend using home test kits to check for hidden blood in the stool.  A small camera at the end of a tube can be used to examine your colon (sigmoidoscopy or colonoscopy). This checks for the earliest forms of colorectal cancer.  Prostate and Testicular Cancer  Depending on your age and overall health, your health care provider may do certain tests to screen for prostate and testicular  cancer.  Talk to your health care provider about any symptoms or concerns you have about testicular or prostate cancer.  Skin Cancer  Check your skin from head to toe regularly.  Tell your health care provider about any new moles or changes in moles, especially if: ? There is a change in a mole's size, shape, or color. ? You have a mole that is larger than a pencil eraser.  Always use sunscreen. Apply sunscreen liberally and repeat throughout the day.  Protect yourself by wearing long sleeves, pants, a wide-brimmed hat, and sunglasses when outside.  What should I know  about heart disease, diabetes, and high blood pressure?  If you are 50-63 years of age, have your blood pressure checked every 3-5 years. If you are 30 years of age or older, have your blood pressure checked every year. You should have your blood pressure measured twice-once when you are at a hospital or clinic, and once when you are not at a hospital or clinic. Record the average of the two measurements. To check your blood pressure when you are not at a hospital or clinic, you can use: ? An automated blood pressure machine at a pharmacy. ? A home blood pressure monitor.  Talk to your health care provider about your target blood pressure.  If you are between 7-37 years old, ask your health care provider if you should take aspirin to prevent heart disease.  Have regular diabetes screenings by checking your fasting blood sugar level. ? If you are at a normal weight and have a low risk for diabetes, have this test once every three years after the age of 45. ? If you are overweight and have a high risk for diabetes, consider being tested at a younger age or more often.  A one-time screening for abdominal aortic aneurysm (AAA) by ultrasound is recommended for men aged 61-75 years who are current or former smokers. What should I know about preventing infection? Hepatitis B If you have a higher risk for hepatitis B, you should be screened for this virus. Talk with your health care provider to find out if you are at risk for hepatitis B infection. Hepatitis C Blood testing is recommended for:  Everyone born from [birth year] through 1966.  Anyone with known risk factors for hepatitis C.  Sexually Transmitted Diseases (STDs)  You should be screened each year for STDs including gonorrhea and chlamydia if: ? You are sexually active and are younger than 78 years of age. ? You are older than 78 years of age and your health care provider tells you that you are at risk for this type of infection. ? Your  sexual activity has changed since you were last screened and you are at an increased risk for chlamydia or gonorrhea. Ask your health care provider if you are at risk.  Talk with your health care provider about whether you are at high risk of being infected with HIV. Your health care provider may recommend a prescription medicine to help prevent HIV infection.  What else can I do?  Schedule regular health, dental, and eye exams.  Stay current with your vaccines (immunizations).  Do not use any tobacco products, such as cigarettes, chewing tobacco, and e-cigarettes. If you need help quitting, ask your health care provider.  Limit alcohol intake to no more than 2 drinks per day. One drink equals 12 ounces of beer, 5 ounces of wine, or 1 ounces of hard liquor.  Do not use street drugs.  Do not share needles.  Ask your health care provider for help if you need support or information about quitting drugs.  Tell your health care provider if you often feel depressed.  Tell your health care provider if you have ever been abused or do not feel safe at home. This information is not intended to replace advice given to you by your health care provider. Make sure you discuss any questions you have with your health care provider. Document Released: 11/08/2007 Document Revised: 01/09/2016 Document Reviewed: 02/13/2015 Elsevier Interactive Patient Education  2018 [place] in the Home Falls can cause injuries. They can happen to people of all ages. There are many things you can do to make your home safe and to help prevent falls. What can I do on the outside of my home?  Regularly fix the edges of walkways and driveways and fix any cracks.  Remove anything that might make you trip as you walk through a door, such as a raised step or threshold.  Trim any bushes or trees on the path to your home.  Use bright outdoor lighting.  Clear any walking paths of anything that might  make someone trip, such as rocks or tools.  Regularly check to see if handrails are loose or broken. Make sure that both sides of any steps have handrails.  Any raised decks and porches should have guardrails on the edges.  Have any leaves, snow, or ice cleared regularly.  Use sand or salt on walking paths during winter.  Clean up any spills in your garage right away. This includes oil or grease spills. What can I do in the bathroom?  Use night lights.  Install grab bars by the toilet and in the tub and shower. Do not use towel bars as grab bars.  Use non-skid mats or decals in the tub or shower.  If you need to sit down in the shower, use a plastic, non-slip stool.  Keep the floor dry. Clean up any water that spills on the floor as soon as it happens.  Remove soap buildup in the tub or shower regularly.  Attach bath mats securely with double-sided non-slip rug tape.  Do not have throw rugs and other things on the floor that can make you trip. What can I do in the bedroom?  Use night lights.  Make sure that you have a light by your bed that is easy to reach.  Do not use any sheets or blankets that are too big for your bed. They should not hang down onto the floor.  Have a firm chair that has side arms. You can use this for support while you get dressed.  Do not have throw rugs and other things on the floor that can make you trip. What can I do in the kitchen?  Clean up any spills right away.  Avoid walking on wet floors.  Keep items that you use a lot in easy-to-reach places.  If you need to reach something above you, use a strong step stool that has a grab bar.  Keep electrical cords out of the way.  Do not use floor polish or wax that makes floors slippery. If you must use wax, use non-skid floor wax.  Do not have throw rugs and other things on the floor that can make you trip. What can I do with my stairs?  Do not leave any items on the stairs.  Make sure  that there are handrails on  both sides of the stairs and use them. Fix handrails that are broken or loose. Make sure that handrails are as long as the stairways.  Check any carpeting to make sure that it is firmly attached to the stairs. Fix any carpet that is loose or worn.  Avoid having throw rugs at the top or bottom of the stairs. If you do have throw rugs, attach them to the floor with carpet tape.  Make sure that you have a light switch at the top of the stairs and the bottom of the stairs. If you do not have them, ask someone to add them for you. What else can I do to help prevent falls?  Wear shoes that: ? Do not have high heels. ? Have rubber bottoms. ? Are comfortable and fit you well. ? Are closed at the toe. Do not wear sandals.  If you use a stepladder: ? Make sure that it is fully opened. Do not climb a closed stepladder. ? Make sure that both sides of the stepladder are locked into place. ? Ask someone to hold it for you, if possible.  Clearly mark and make sure that you can see: ? Any grab bars or handrails. ? First and last steps. ? Where the edge of each step is.  Use tools that help you move around (mobility aids) if they are needed. These include: ? Canes. ? Walkers. ? Scooters. ? Crutches.  Turn on the lights when you go into a dark area. Replace any light bulbs as soon as they burn out.  Set up your furniture so you have a clear path. Avoid moving your furniture around.  If any of your floors are uneven, fix them.  If there are any pets around you, be aware of where they are.  Review your medicines with your doctor. Some medicines can make you feel dizzy. This can increase your chance of falling. Ask your doctor what other things that you can do to help prevent falls. This information is not intended to replace advice given to you by your health care provider. Make sure you discuss any questions you have with your health care provider. Document  Released: 03/08/2009 Document Revised: 10/18/2015 Document Reviewed: 06/16/2014 Elsevier Interactive Patient Education  [employer].

## 2018-02-04 NOTE — Progress Notes (Signed)
2D Echocardiogram has been performed.  Travis [name] 02/04/2018, 9:35 AM

## 2018-02-04 NOTE — Patient Instructions (Signed)
Decrease Carvedilol to 3.125 mg Twice daily   Increase Torsemide to 40 mg (2 tabs) Twice daily FOR 4 DAYS ONLY, then decrease to 40 mg (2 tabs) in AM and 20 mg (1 tab) in PM  Change Atorvastatin to 10 mg daily  Labs today  Labs in 10 days  Your physician recommends that you schedule a follow-up appointment in: 3 weeks with Dr Aundra Dubin

## 2018-02-04 NOTE — Progress Notes (Signed)
I have reviewed and agree with note, evaluation, plan. Please let him know I am happy to see him if changing amiodarone doesn't help improve fatigue to see if anything else I can do to help   [name], MD

## 2018-02-04 NOTE — Progress Notes (Signed)
Advanced Heart Failure Clinic Note   PCP: [name], MD PCP-Cardiologist: Virl Axe, MD  HF: [name]   HPI:  Travis [name] is a 77 y.o. 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   Pt failed DCCV 08/14/17, 08/31/17, and 09/17/17. Failed Tikosyn. Did have conversion with Amiodarone and DCCV. Pt underwent successful ablation 10/07/17.  However, he had ERAF.  Admitted 5/18 - 10/14/17 with atrial fibrillation/RVR and acute on chronic systolic CHF.  Echo showed EF 30-35%. Diuresed and reloaded with Amio. Underwent successful DCCV 10/13/17.   He presents today for followup of CHF and atrial fibrillation.  He remains very fatigued/tired with exertion.  He is tired walking up a flight of stairs or a moderate distance on flat ground.  No chest pain.  He has not felt atrial fibrillation.  He has been too fatigued to go to the gym.  He is not particularly short of breath.  Weight is up 6 lbs since last appointment. He is off spironolactone with elevated K.  He is concerned that amiodarone has caused his symptoms, dose was lowered to 100 mg daily.   Labs (3/19): LDL 65 Labs (8/19): K 5.2 => 4.1, creatinine 1.6  Review of systems complete and found to be negative unless listed in HPI.    Past Medical History 1. Chronic systolic CHF: Possible tachy-mediated cardiomyopathy.    - Echo (08/13/2017): LVEF 30-35%, moderate AS, mild AI, severe LAE, RV mild dilated with systolic function mild/mod reduced, PA peak pressure 46 mm Hg. - Echo (9/19): EF 50%, mild diffuse hypokinesis, normal RV size and systolic function, PASP 70 mmHg, moderate AS, moderate AI, moderate MR.  2. Atrial fibrillation: Has been difficult to control.  Failed Tikosyn and multiple DCCVs.  Failed amiodarone prior to ablation.   Atrial fibrillation ablation but then had ERAF.  He was cardioverted again in 5/19 with return to NSR.    3. Bicuspid aortic valve disorder: Moderate AS and moderate AI on last echo.   4. HTN 5. Hyperlipidemia 6. Mitral regurgitation: Moderate on last echo.  7. CAD: coronary calcium noted on chest CT.  8. CKD: Stage 3.  9. Carotid stenosis: Carotid dopplers (0/97) with 35-32% LICA stenosis.  10. Chronic LBBB 11. PUD  Current Outpatient Medications  Medication Sig Dispense Refill  . acetaminophen (TYLENOL) 500 MG tablet Take 1,000 mg by mouth every 6 (six) hours as needed for mild pain or headache.     . ALPRAZolam (XANAX) 0.5 MG tablet TAKE 1 TABLET BY MOUTH TWICE DAILY AS NEEDED FOR ANXIETY( SEPARATE BY 8 HOURS FROM TEMAZEPAM) 5 tablet 0  . amiodarone (PACERONE) 200 MG tablet Take 0.5 tablets (100 mg total) by mouth daily. 90 tablet 2  . atorvastatin (LIPITOR) 10 MG tablet Take 1 tablet (10 mg total) by mouth daily. 30 tablet 6  . carvedilol (COREG) 3.125 MG tablet Take 1 tablet (3.125 mg total) by mouth 2 (two) times daily with a meal. 180 tablet 3  . finasteride (PROPECIA) 1 MG tablet TAKE 1 TABLET(1 MG) BY MOUTH DAILY 30 tablet 5  . hydroxypropyl methylcellulose / hypromellose (ISOPTO TEARS / GONIOVISC) 2.5 % ophthalmic solution Place 2 drops into both eyes 3 (three) times daily as needed for dry eyes.    Marland Kitchen losartan (COZAAR) 25 MG tablet Take 0.5 tablets (12.5  mg total) by mouth daily. 30 tablet 11  . meloxicam (MOBIC) 15 MG tablet TAKE 1 TABLET BY MOUTH DAILY 90 tablet 0  . omeprazole (PRILOSEC) 20 MG capsule TAKE 1 CAPSULE BY MOUTH DAILY 90 capsule 0  . Rivaroxaban (XARELTO) 15 MG TABS tablet Take 1 tablet (15 mg total) by mouth daily with supper. 60 tablet 2  . temazepam (RESTORIL) 15 MG capsule TAKE ONE CAPSULE BY MOUTH AT BEDTIME AS NEEDED 30 capsule 5  . torsemide (DEMADEX) 20 MG tablet Take 2 tabs in AM and 1 tab in PM 90 tablet 2   No current facility-administered medications  for this encounter.    No Known Allergies  Social History   Socioeconomic History  . Marital status: Single    Spouse name: Not on file  . Number of children: 0  . Years of education: Not on file  . Highest education level: Not on file  Occupational History  . Occupation: Retired  Scientific laboratory technician  . Financial resource strain: Not on file  . Food insecurity:    Worry: Not on file    Inability: Not on file  . Transportation needs:    Medical: Not on file    Non-medical: Not on file  Tobacco Use  . Smoking status: Former Smoker    Packs/day: 1.50    Years: 25.00    Pack years: 37.50    Last attempt to quit: 05/26/1986    Years since quitting: 31.7  . Smokeless tobacco: Never Used  Substance and Sexual Activity  . Alcohol use: Yes    Alcohol/week: 1.0 standard drinks    Types: 1 Standard drinks or equivalent per week    Comment: stopped drinking   . Drug use: No  . Sexual activity: Not Currently  Lifestyle  . Physical activity:    Days per week: Not on file    Minutes per session: Not on file  . Stress: Not on file  Relationships  . Social connections:    Talks on phone: Not on file    Gets together: Not on file    Attends religious service: Not on file    Active member of club or organization: Not on file    Attends meetings of clubs or organizations: Not on file    Relationship status: Not on file  . Intimate partner violence:    Fear of current or ex partner: Not on file    Emotionally abused: Not on file    Physically abused: Not on file    Forced sexual activity: Not on file  Other Topics Concern  . Not on file  Social History Narrative   Homosexual. Lives alone. Not sexually active.       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 Stroke Father 43       smoker  . Lung cancer Mother 24       former smoker  .  Stroke Mother   . Stroke Brother   . Colon cancer Neg Hx    Vitals:   02/04/18 0951  BP: 114/60  Pulse: (!) 49  SpO2: 93%  Weight: 80.8 kg (178 lb 3.2 oz)     Wt Readings from Last 3 Encounters:  02/04/18 78.9 kg (174 lb)  02/04/18 80.8 kg (178 lb 3.2 oz)  01/11/18 78.5 kg (173  lb)    PHYSICAL EXAM: General: NAD Neck: JVP 8-9 cm with HJR, no thyromegaly or thyroid nodule.  Lungs: Clear to auscultation bilaterally with normal respiratory effort. CV: Nondisplaced PMI.  Heart regular S1/S2, no F6/C1, 3/6 systolic crescendo-decrescendo murmur RUSB with clear S2.  Trace ankle edema.  No carotid bruit.  Normal pedal pulses.  Abdomen: Soft, nontender, no hepatosplenomegaly, no distention.  Skin: Intact without lesions or rashes.  Neurologic: Alert and oriented x 3.  Psych: Normal affect. Extremities: No clubbing or cyanosis.  HEENT: Normal.   ASSESSMENT & PLAN: 1. Chronic systolic CHF: Echo 2/75 with EF 30-35%, mild to moderately decreased RV systolic function in setting of atrial fibrillation with RVR.  He is now in NSR, and echo today was reviewed, showing EF 50%, mild diffuse hypokinesis.  I suspect he has had a tachy-mediated cardiomyopathy.  Weight is up, NYHA class III symptoms.  Fatigue more prominent than dyspnea.  Mild volume overload on exam. HR is low in 40s.  - Increase torsemide to 40 mg bid x 4 days, then 40 qam/20 qpm.  BMET today and again in 10 days.  - Low HR may be playing a role in fatigue, decrease Coreg to 3.125 mg bid.  - Off spironolactone with elevated K.  Can leave off for now with EF back up to 50%.  - Continue losartan 12.5 mg daily.   - Need to either keep in NSR or eventually AVN ablate/pace (see below). - With no chest pain and rise in EF to 50%, suspect most likely tachycardia-mediated CMP.  Can hold off on coronary angiography.   - Given fatigue and Xarelto use, I will send CBC to assess for anemia.  I will also send TSH.  2. Atrial fibrillation:  Persistent, difficult to control rate and rhythm. He failed Tikosyn. He started on amiodarone, then had atrial fibrillation ablation in 5/19 with ERAF, requiring DCCV.  He remains in NSR today and has not felt atrial fibrillation.  - Continue amiodarone 100 mg daily.  Check LFTs/TSH today, he will need regular eye exam.  - Continue Xarelto.  - If he fails to maintain NSR despite amiodarone and DCCV, may need AV nodal ablation/BiV pacing.  3. CAD: Extensive coronary calcification on recent CT chest. No chest pain. As his cardiomyopathy appears tachy-mediated, hold off on cath for now.  - No ASA with Xarelto use.  - He is taking atorvastatin 40 mg every week.  I will increase this to 10 mg daily.   4. CKD: Stage 3 - BMET today.  5. Bicuspid aortic valve stenosis: Moderate AS/moderate AI on last echo.  6. Mitral regurgitation: Moderate on echo in 9/19.   7. LBBB: Chronic.   Followup in 3 wks with me.   [name], MD 02/04/18

## 2018-02-05 ENCOUNTER — Telehealth: Payer: Self-pay | Admitting: Gastroenterology

## 2018-02-05 ENCOUNTER — Encounter (HOSPITAL_COMMUNITY): Payer: Self-pay | Admitting: Emergency Medicine

## 2018-02-05 ENCOUNTER — Other Ambulatory Visit: Payer: Self-pay

## 2018-02-05 ENCOUNTER — Observation Stay (HOSPITAL_COMMUNITY)
Admission: EM | Admit: 2018-02-05 | Discharge: 2018-02-06 | Disposition: A | Discharge status: Home / Self Care | Payer: Medicare Other | Attending: Internal Medicine | Admitting: Internal Medicine

## 2018-02-05 DIAGNOSIS — K315 Obstruction of duodenum: Secondary | ICD-10-CM | POA: Diagnosis not present

## 2018-02-05 DIAGNOSIS — N183 Chronic kidney disease, stage 3 unspecified: Secondary | ICD-10-CM | POA: Diagnosis present

## 2018-02-05 DIAGNOSIS — N179 Acute kidney failure, unspecified: Secondary | ICD-10-CM | POA: Diagnosis not present

## 2018-02-05 DIAGNOSIS — R5383 Other fatigue: Secondary | ICD-10-CM | POA: Diagnosis not present

## 2018-02-05 DIAGNOSIS — I10 Essential (primary) hypertension: Secondary | ICD-10-CM

## 2018-02-05 DIAGNOSIS — Z79899 Other long term (current) drug therapy: Secondary | ICD-10-CM | POA: Diagnosis not present

## 2018-02-05 DIAGNOSIS — D649 Anemia, unspecified: Secondary | ICD-10-CM

## 2018-02-05 DIAGNOSIS — I5022 Chronic systolic (congestive) heart failure: Secondary | ICD-10-CM | POA: Insufficient documentation

## 2018-02-05 DIAGNOSIS — K922 Gastrointestinal hemorrhage, unspecified: Secondary | ICD-10-CM | POA: Diagnosis not present

## 2018-02-05 DIAGNOSIS — K219 Gastro-esophageal reflux disease without esophagitis: Secondary | ICD-10-CM | POA: Diagnosis not present

## 2018-02-05 DIAGNOSIS — R195 Other fecal abnormalities: Secondary | ICD-10-CM

## 2018-02-05 DIAGNOSIS — F419 Anxiety disorder, unspecified: Secondary | ICD-10-CM | POA: Insufficient documentation

## 2018-02-05 DIAGNOSIS — K264 Chronic or unspecified duodenal ulcer with hemorrhage: Secondary | ICD-10-CM | POA: Diagnosis not present

## 2018-02-05 DIAGNOSIS — Z8711 Personal history of peptic ulcer disease: Secondary | ICD-10-CM | POA: Insufficient documentation

## 2018-02-05 DIAGNOSIS — Z7901 Long term (current) use of anticoagulants: Secondary | ICD-10-CM | POA: Insufficient documentation

## 2018-02-05 DIAGNOSIS — I481 Persistent atrial fibrillation: Secondary | ICD-10-CM

## 2018-02-05 DIAGNOSIS — E871 Hypo-osmolality and hyponatremia: Secondary | ICD-10-CM | POA: Diagnosis present

## 2018-02-05 DIAGNOSIS — Z87891 Personal history of nicotine dependence: Secondary | ICD-10-CM | POA: Insufficient documentation

## 2018-02-05 DIAGNOSIS — D509 Iron deficiency anemia, unspecified: Secondary | ICD-10-CM | POA: Insufficient documentation

## 2018-02-05 DIAGNOSIS — I130 Hypertensive heart and chronic kidney disease with heart failure and stage 1 through stage 4 chronic kidney disease, or unspecified chronic kidney disease: Secondary | ICD-10-CM | POA: Diagnosis not present

## 2018-02-05 DIAGNOSIS — R001 Bradycardia, unspecified: Secondary | ICD-10-CM | POA: Diagnosis present

## 2018-02-05 DIAGNOSIS — K317 Polyp of stomach and duodenum: Secondary | ICD-10-CM | POA: Diagnosis not present

## 2018-02-05 DIAGNOSIS — I4819 Other persistent atrial fibrillation: Secondary | ICD-10-CM | POA: Diagnosis present

## 2018-02-05 LAB — COMPREHENSIVE METABOLIC PANEL
ALK PHOS: 92 U/L (ref 38–126)
ALT: 20 U/L (ref 0–44)
AST: 26 U/L (ref 15–41)
Albumin: 3.9 g/dL (ref 3.5–5.0)
Anion gap: 12 (ref 5–15)
BILIRUBIN TOTAL: 1.1 mg/dL (ref 0.3–1.2)
BUN: 33 mg/dL — ABNORMAL HIGH (ref 8–23)
CALCIUM: 8.7 mg/dL — AB (ref 8.9–10.3)
CO2: 24 mmol/L (ref 22–32)
Chloride: 95 mmol/L — ABNORMAL LOW (ref 98–111)
Creatinine, Ser: 2.65 mg/dL — ABNORMAL HIGH (ref 0.61–1.24)
GFR calc Af Amer: 25 mL/min — ABNORMAL LOW (ref >60)
GFR, EST NON AFRICAN AMERICAN: 22 mL/min — AB (ref >60)
Glucose, Bld: 118 mg/dL — ABNORMAL HIGH (ref 70–99)
POTASSIUM: 5.2 mmol/L — AB (ref 3.5–5.1)
Sodium: 131 mmol/L — ABNORMAL LOW (ref 135–145)
TOTAL PROTEIN: 6.9 g/dL (ref 6.5–8.1)

## 2018-02-05 LAB — CBC
HEMATOCRIT: 25.6 % — AB (ref 39.0–52.0)
Hemoglobin: 8 g/dL — ABNORMAL LOW (ref 13.0–17.0)
MCH: 29 pg (ref 26.0–34.0)
MCHC: 31.3 g/dL (ref 30.0–36.0)
MCV: 92.8 fL (ref 78.0–100.0)
Platelets: 305 10*3/uL (ref 150–400)
RBC: 2.76 MIL/uL — ABNORMAL LOW (ref 4.22–5.81)
RDW: 15.8 % — AB (ref 11.5–15.5)
WBC: 7.1 10*3/uL (ref 4.0–10.5)

## 2018-02-05 LAB — PROTIME-INR
INR: 1.83
PROTHROMBIN TIME: 21 s — AB (ref 11.4–15.2)

## 2018-02-05 LAB — PREPARE RBC (CROSSMATCH)

## 2018-02-05 LAB — POC OCCULT BLOOD, ED: FECAL OCCULT BLD: POSITIVE — AB

## 2018-02-05 LAB — ABO/RH: ABO/RH(D): O POS

## 2018-02-05 MED ORDER — FINASTERIDE 1 MG PO TABS: 1.0000 mg | ORAL_TABLET | Freq: Every day | ORAL | Status: DC

## 2018-02-05 MED ORDER — SODIUM CHLORIDE 0.9% FLUSH: 3.0000 mL | Freq: Two times a day (BID) | INTRAVENOUS | Status: DC

## 2018-02-05 MED ORDER — HYPROMELLOSE (GONIOSCOPIC) 2.5 % OP SOLN: 2.0000 [drp] | Freq: Three times a day (TID) | OPHTHALMIC | Status: DC | PRN

## 2018-02-05 MED ORDER — SODIUM CHLORIDE 0.9 % IV SOLN: 250.0000 mL | INTRAVENOUS | Status: DC | PRN

## 2018-02-05 MED ORDER — ALPRAZOLAM 0.5 MG PO TABS: 0.5000 mg | ORAL_TABLET | Freq: Two times a day (BID) | ORAL | Status: DC | PRN

## 2018-02-05 MED ORDER — ONDANSETRON HCL 4 MG/2ML IJ SOLN: 4.0000 mg | Freq: Four times a day (QID) | INTRAMUSCULAR | Status: DC | PRN

## 2018-02-05 MED ORDER — ACETAMINOPHEN 650 MG RE SUPP: 650.0000 mg | Freq: Four times a day (QID) | RECTAL | Status: DC | PRN

## 2018-02-05 MED ORDER — TEMAZEPAM 15 MG PO CAPS: 15.0000 mg | ORAL_CAPSULE | Freq: Every evening | ORAL | Status: DC | PRN

## 2018-02-05 MED ORDER — CARVEDILOL 3.125 MG PO TABS
3.1250 mg | ORAL_TABLET | Freq: Two times a day (BID) | ORAL | Status: DC
  Administered 2018-02-06: 3.125 mg via ORAL
  Filled 2018-02-05: qty 1

## 2018-02-05 MED ORDER — SODIUM CHLORIDE 0.9 % IV SOLN: INTRAVENOUS | Status: DC

## 2018-02-05 MED ORDER — SODIUM CHLORIDE 0.9% FLUSH: 3.0000 mL | INTRAVENOUS | Status: DC | PRN

## 2018-02-05 MED ORDER — PANTOPRAZOLE SODIUM 40 MG IV SOLR
40.0000 mg | Freq: Two times a day (BID) | INTRAVENOUS | Status: DC
  Administered 2018-02-06 (×2): 40 mg via INTRAVENOUS
  Filled 2018-02-05 (×3): qty 40

## 2018-02-05 MED ORDER — ATORVASTATIN CALCIUM 10 MG PO TABS: 10.0000 mg | ORAL_TABLET | Freq: Every day | ORAL | Status: DC

## 2018-02-05 MED ORDER — SODIUM CHLORIDE 0.9% FLUSH
3.0000 mL | Freq: Two times a day (BID) | INTRAVENOUS | Status: DC
  Administered 2018-02-06: 3 mL via INTRAVENOUS

## 2018-02-05 MED ORDER — ONDANSETRON HCL 4 MG PO TABS: 4.0000 mg | ORAL_TABLET | Freq: Four times a day (QID) | ORAL | Status: DC | PRN

## 2018-02-05 MED ORDER — AMIODARONE HCL 100 MG PO TABS
100.0000 mg | ORAL_TABLET | Freq: Every day | ORAL | Status: DC
  Administered 2018-02-06: 100 mg via ORAL
  Filled 2018-02-05: qty 1

## 2018-02-05 MED ORDER — SODIUM CHLORIDE 0.9% IV SOLUTION
Freq: Once | INTRAVENOUS | Status: AC
  Administered 2018-02-06: 400 mL via INTRAVENOUS
  Administered 2018-02-06: 07:00:00 via INTRAVENOUS

## 2018-02-05 MED ORDER — SODIUM CHLORIDE 0.9 % IV SOLN
10.0000 mL/h | Freq: Once | INTRAVENOUS | Status: AC
  Administered 2018-02-05: 10 mL/h via INTRAVENOUS

## 2018-02-05 MED ORDER — ACETAMINOPHEN 325 MG PO TABS: 650.0000 mg | ORAL_TABLET | Freq: Four times a day (QID) | ORAL | Status: DC | PRN

## 2018-02-05 NOTE — Progress Notes (Signed)
Received report from ED RN. Room ready for patient. Marlowe Lawes Joselita, RN 

## 2018-02-05 NOTE — ED Provider Notes (Signed)
[facility] EMERGENCY DEPARTMENT Provider Note   CSN: [id number] Arrival date & time: 02/05/18  1517     History   Chief Complaint Chief Complaint  Patient presents with  . Abnormal Lab    HPI Travis [name] is a 77 y.o. male who presents past medical history of ulcers, CHF, hypertension, bicuspid aortic valve who comes in today for evaluation of abnormal lab.  Patient states he was called by his cardiologist and told that his hemoglobin was 7.8.  His last hemoglobin was approximate 4 months ago and was in the 14th at that time.  Patient states that he gone to his cardiologist yesterday for routine checkup.  He does report that over the last several months, he has had some fatigue, lightheadedness with standing.  He states that he just felt "rundown" and thought it was due to the amiodarone he had been taking.  He states that he is currently on Xarelto she has been taking.  He also reports that he has been taking some Mobic over the last few months to help with his arthritic pain.  Patient states he has not had any history of anemia.  Patient has not noticed any black or tarry stools or hematemesis.  Patient denies any chest pain, difficulty breathing, abdominal pain, nausea/vomiting, syncope.  The history is provided by the patient.    Past Medical History:  Diagnosis Date  . Arthritis   . Biatrial enlargement    severe  . Bicuspid aortic valve   . CHF (congestive heart failure) ([place])    JULY 2014  . Chronic renal insufficiency   . GERD (gastroesophageal reflux disease)   . History of cardioversion 12/16/2012  . History of stomach ulcers   . Hypertension   . Left bundle branch block   . Nonischemic cardiomyopathy ([place])       . Persistent atrial fibrillation ([place])    multiple prior cardioversion  . Sinus bradycardia   . Status post clamping of cerebral aneurysm 79    Patient Active Problem List   Diagnosis Date Noted  . Occult GI bleeding 02/05/2018  .  Acute renal failure superimposed on stage 3 chronic kidney disease ([place]) 02/05/2018  . Chronic systolic heart failure ([place]) 10/10/2017  . Persistent atrial fibrillation ([place]) 10/06/2017  . Symptomatic anemia 08/01/2015  . Hyperlipidemia 12/19/2014  . Male pattern baldness 03/06/2014  . Anxiety state 03/06/2014  . Insomnia 03/06/2014  . Overweight (BMI 25.0-29.9) 02/08/2014  . S/P TKR (total knee replacement) 02/08/2014  . Sinus bradycardia 01/04/2013  . Pulmonary hypertension ([place]) 03/09/2012  . Hyponatremia 02/21/2012  . Bicuspid aortic valve   . Hypertension   . Cardiomyopathy, rate related with resolution now recurrent 12/08/2011  . Left bundle branch block 12/10/2010  . DRY MOUTH 11/07/2009  . Carotid bruit 07/05/2009  . GERD 01/07/2008  . Osteoarthritis 02/04/2007  . GANGLION CYST, WRIST, LEFT 02/04/2007    Past Surgical History:  Procedure Laterality Date  . ATRIAL FIBRILLATION ABLATION N/A 10/06/2017   Procedure: ATRIAL FIBRILLATION ABLATION;  Surgeon: [name], MD;  Location: [facility];  Service: Cardiovascular;  Laterality: N/A;  . CARDIAC CATHETERIZATION    . CARDIOVERSION N/A 12/16/2012   Procedure: CARDIOVERSION;  Surgeon: Lelon Perla, MD;  Location: [facility] ENDOSCOPY;  Service: Cardiovascular;  Laterality: N/A;  . CARDIOVERSION N/A 08/14/2017   Procedure: CARDIOVERSION;  Surgeon: Josue Hector, MD;  Location: [facility] ENDOSCOPY;  Service: Cardiovascular;  Laterality: N/A;  . CARDIOVERSION N/A 09/21/2017  Procedure: CARDIOVERSION;  Surgeon: Sanda Klein, MD;  Location: [facility] ENDOSCOPY;  Service: Cardiovascular;  Laterality: N/A;  . CARDIOVERSION N/A 10/13/2017   Procedure: CARDIOVERSION;  Surgeon: Josue Hector, MD;  Location: [facility] ENDOSCOPY;  Service: Cardiovascular;  Laterality: N/A;  . CATARACT EXTRACTION  05/2015   bilateral  . cerebral anuersym post clips    . fractured left arm    . HERNIA REPAIR     lft  . INGUINAL HERNIA REPAIR  07/02/2011    Procedure: LAPAROSCOPIC INGUINAL HERNIA;  Surgeon: Harl Bowie, MD;  Location: [facility];  Service: General;  Laterality: Left;  Laparoscopic left inguinal hernia repair and mesh  . left tendon repair     lft foot  . ROTATOR CUFF REPAIR     lf  . TEE WITHOUT CARDIOVERSION N/A 08/14/2017   Procedure: TRANSESOPHAGEAL ECHOCARDIOGRAM (TEE);  Surgeon: Josue Hector, MD;  Location: [facility] ENDOSCOPY;  Service: Cardiovascular;  Laterality: N/A;  . TONSILLECTOMY    . TOTAL KNEE ARTHROPLASTY Bilateral 02/06/2014   Procedure: TOTAL KNEE BILATERAL;  Surgeon: Mauri Pole, MD;  Location: [facility] ORS;  Service: Orthopedics;  Laterality: Bilateral;  . WRIST GANGLION EXCISION     lft        Home Medications    Prior to Admission medications   Medication Sig Start Date End Date Taking? Authorizing Provider  acetaminophen (TYLENOL) 500 MG tablet Take 1,000 mg by mouth every 6 (six) hours as needed for mild pain or headache.    Yes [provider]  ALPRAZolam (XANAX) 0.5 MG tablet TAKE 1 TABLET BY MOUTH TWICE DAILY AS NEEDED FOR ANXIETY( SEPARATE BY 8 HOURS FROM TEMAZEPAM) Patient taking differently: Take 0.5 mg by mouth 2 (two) times daily as needed for anxiety (seperate by 8 hours from temazepam).  09/25/17  Yes [name], MD  amiodarone (PACERONE) 200 MG tablet Take 0.5 tablets (100 mg total) by mouth daily. 01/26/18  Yes Sherran Needs, NP  amoxicillin (AMOXIL) 500 MG tablet Take 2,000 mg by mouth once as needed. Dental procedure 02/04/18  Yes [provider]  atorvastatin (LIPITOR) 10 MG tablet Take 1 tablet (10 mg total) by mouth daily. 02/04/18  Yes Larey Dresser, MD  carvedilol (COREG) 3.125 MG tablet Take 1 tablet (3.125 mg total) by mouth 2 (two) times daily with a meal. 02/04/18  Yes Larey Dresser, MD  finasteride (PROPECIA) 1 MG tablet TAKE 1 TABLET(1 MG) BY MOUTH DAILY Patient taking differently: Take 1 mg by mouth daily.  10/05/17  Yes [name], MD    hydroxypropyl methylcellulose / hypromellose (ISOPTO TEARS / GONIOVISC) 2.5 % ophthalmic solution Place 2 drops into both eyes 3 (three) times daily as needed for dry eyes.   Yes [provider]  losartan (COZAAR) 25 MG tablet Take 0.5 tablets (12.5 mg total) by mouth daily. 10/14/17 10/14/18 Yes Seiler, Amber K, NP  meloxicam (MOBIC) 15 MG tablet TAKE 1 TABLET BY MOUTH DAILY Patient taking differently: Take 15 mg by mouth daily.  01/11/18  Yes [name], MD  omeprazole (PRILOSEC) 20 MG capsule TAKE 1 CAPSULE BY MOUTH DAILY Patient taking differently: Take 20 mg by mouth daily.  11/25/17  Yes Milus Banister, MD  Rivaroxaban (XARELTO) 15 MG TABS tablet Take 1 tablet (15 mg total) by mouth daily with supper. 09/21/17  Yes Kathyrn Drown D, NP  temazepam (RESTORIL) 15 MG capsule TAKE ONE CAPSULE BY MOUTH AT BEDTIME AS NEEDED Patient taking differently: Take  15 mg by mouth at bedtime.  11/10/17  Yes [name], MD  torsemide (DEMADEX) 20 MG tablet Take 2 tabs in AM and 1 tab in PM Patient taking differently: Take 20-40 mg by mouth See admin instructions. Take 2 tablets by mouth in the morning, and 2 tablets at night for 4 days (start 02/04/18), then take 2 tablets in the morning, and 1 tablet at night thereafter for 3 weeks then follow up with Dr. 02/04/18  Yes Larey Dresser, MD    Family History Family History  Problem Relation Age of Onset  . Stroke Father 27       smoker  . Lung cancer Mother 71       former smoker  . Stroke Mother   . Stroke Brother   . Colon cancer Neg Hx     Social History Social History   Tobacco Use  . Smoking status: Former Smoker    Packs/day: 1.50    Years: 25.00    Pack years: 37.50    Last attempt to quit: 05/26/1986    Years since quitting: 31.7  . Smokeless tobacco: Never Used  Substance Use Topics  . Alcohol use: Yes    Alcohol/week: 1.0 standard drinks    Types: 1 Standard drinks or equivalent per week    Comment: stopped  drinking   . Drug use: No     Allergies   Patient has no known allergies.   Review of Systems Review of Systems  Constitutional: Positive for fatigue. Negative for fever.  Respiratory: Negative for cough and shortness of breath.   Cardiovascular: Negative for chest pain.  Gastrointestinal: Negative for abdominal pain, nausea and vomiting.  Genitourinary: Negative for dysuria and hematuria.  Neurological: Positive for light-headedness. Negative for headaches.  All other systems reviewed and are negative.    Physical Exam Updated Vital Signs BP (!) 140/96   Pulse 76   Temp (!) 97.5 F (36.4 C) (Oral)   Resp 16   SpO2 100%   Physical Exam  Constitutional: He is oriented to person, place, and time. He appears well-developed and well-nourished.  HENT:  Head: Normocephalic and atraumatic.  Mouth/Throat: Oropharynx is clear and moist and mucous membranes are normal.  Eyes: Pupils are equal, round, and reactive to light. Conjunctivae, EOM and lids are normal.  Neck: Full passive range of motion without pain.  Cardiovascular: Normal rate, regular rhythm, normal heart sounds and normal pulses. Exam reveals no gallop and no friction rub.  No murmur heard. Pulses:      Radial pulses are 2+ on the right side, and 2+ on the left side.       Dorsalis pedis pulses are 2+ on the right side, and 2+ on the left side.  Pulmonary/Chest: Effort normal and breath sounds normal.  Lungs clear to auscultation bilaterally.  Symmetric chest rise.  No wheezing, rales, rhonchi.  Abdominal: Soft. Normal appearance. There is no tenderness. There is no rigidity and no guarding.  Abdomen is soft, non-distended, non-tender. No rigidity, No guarding. No peritoneal signs.  Musculoskeletal: Normal range of motion.  Neurological: He is alert and oriented to person, place, and time.  Skin: Skin is warm and dry. Capillary refill takes less than 2 seconds.  Psychiatric: He has a normal mood and affect. His  speech is normal.  Nursing note and vitals reviewed.    ED Treatments / Results  Labs (all labs ordered are listed, but only abnormal results are displayed) Labs Reviewed  CBC - Abnormal; Notable for the following components:      Result Value   RBC 2.76 (*)    Hemoglobin 8.0 (*)    HCT 25.6 (*)    RDW 15.8 (*)    All other components within normal limits  COMPREHENSIVE METABOLIC PANEL - Abnormal; Notable for the following components:   Sodium 131 (*)    Potassium 5.2 (*)    Chloride 95 (*)    Glucose, Bld 118 (*)    BUN 33 (*)    Creatinine, Ser 2.65 (*)    Calcium 8.7 (*)    GFR calc non Af Amer 22 (*)    GFR calc Af Amer 25 (*)    All other components within normal limits  PROTIME-INR - Abnormal; Notable for the following components:   Prothrombin Time 21.0 (*)    All other components within normal limits  POC OCCULT BLOOD, ED - Abnormal; Notable for the following components:   Fecal Occult Bld POSITIVE (*)    All other components within normal limits  VITAMIN B12  FERRITIN  IRON AND TIBC  BASIC METABOLIC PANEL  SODIUM, URINE, RANDOM  UREA NITROGEN, URINE  CREATININE, URINE, RANDOM  URINALYSIS, ROUTINE W REFLEX MICROSCOPIC  TYPE AND SCREEN  ABO/RH  PREPARE RBC (CROSSMATCH)    EKG None  Radiology No results found.  Procedures .Critical Care Performed by: Volanda Napoleon, PA-C Authorized by: Volanda Napoleon, PA-C   Critical care provider statement:    Critical care time (minutes):  45   Critical care was necessary to treat or prevent imminent or life-threatening deterioration of the following conditions:  Cardiac failure, renal failure and circulatory failure   Critical care was time spent personally by me on the following activities:  Discussions with consultants, evaluation of patient's response to treatment, examination of patient, ordering and performing treatments and interventions, ordering and review of laboratory studies, ordering and review of  radiographic studies, pulse oximetry, re-evaluation of patient's condition, obtaining history from patient or surrogate and review of old charts   (including critical care time)  Medications Ordered in ED Medications  pantoprazole (PROTONIX) injection 40 mg (has no administration in time range)  amiodarone (PACERONE) tablet 100 mg (has no administration in time range)  atorvastatin (LIPITOR) tablet 10 mg (has no administration in time range)  carvedilol (COREG) tablet 3.125 mg (has no administration in time range)  ALPRAZolam (XANAX) tablet 0.5 mg (has no administration in time range)  temazepam (RESTORIL) capsule 15 mg (has no administration in time range)  finasteride (PROPECIA) tablet 1 mg (has no administration in time range)  hydroxypropyl methylcellulose / hypromellose (ISOPTO TEARS / GONIOVISC) 2.5 % ophthalmic solution 2 drop (has no administration in time range)  0.9 %  sodium chloride infusion (Manually program via Guardrails IV Fluids) (has no administration in time range)  sodium chloride flush (NS) 0.9 % injection 3 mL (has no administration in time range)  sodium chloride flush (NS) 0.9 % injection 3 mL (has no administration in time range)  sodium chloride flush (NS) 0.9 % injection 3 mL (has no administration in time range)  0.9 %  sodium chloride infusion (has no administration in time range)  acetaminophen (TYLENOL) tablet 650 mg (has no administration in time range)    Or  acetaminophen (TYLENOL) suppository 650 mg (has no administration in time range)  ondansetron (ZOFRAN) tablet 4 mg (has no administration in time range)    Or  ondansetron (ZOFRAN) injection 4 mg (has  no administration in time range)  0.9 %  sodium chloride infusion (0 mL/hr Intravenous Stopped 02/05/18 2244)     Initial Impression / Assessment and Plan / ED Course  I have reviewed the triage vital signs and the nursing notes.  Pertinent labs & imaging results that were available during my care of  the patient were reviewed by me and considered in my medical decision making (see chart for details).  Clinical Course as of Feb 06 2331  Fri Feb 06, 1484  [id number] 77 year old male on Eliquis and taking NSAIDs here with increased fatigue and on testing by his PCP found to have a low hemoglobin.  He is also got an increase in his creatinine is along with few other abnormalities.  We reviewed with GI and they are planning on endoscopy tomorrow and is getting transfused.  Will need to be admitted to the hospital holding anticoagulation.   [MB]    Clinical Course User Index [MB] Hayden Rasmussen, MD    77 year old male with past medical history of A. fib, hypertension who presents for evaluation of abnormal lab.  He was called by his cardiologist today and told that his hemoglobin is low and that he needed to go to the department.  He denies any hematemesis, melena at home.  Has not had any abdominal pain.  Is currently on Xarelto for his A. fib and reports that he has been compliant with it.  Additionally, he has been taking Mobic to help with his arthritic pain.  He sees Dr. Algernon Huxley for cardiology and Dr. Oretha Caprice for GI.  Concern for GI bleed versus symptomatic anemia.  Initial labs ordered at triage.  CMP shows potassium of 5.2, BUN and creatinine are elevated at 33 and 2.65.  His BUN and creatinine yesterday were 32 and 1.66 respectively.  CBC shows hemoglobin of 8.0 and hematocrit of 25.6.  His hemoglobin in May was in the 14th.  He is fecal occult positive.  Discussed patient with Dr. Bryan Lemma ([facility]).  Recommend starting patient on 40 mg Protonix twice daily.  Recommend to begin an iron and B12 panel.  Would like to hold his Xarelto.  Plan for n.p.o. after midnight with plans to do EGD tomorrow morning.  Discussed patient with Dr. Myna Hidalgo (hospitalist). Will accept for admission.    Final Clinical Impressions(s) / ED Diagnoses   Final diagnoses:  Gastrointestinal hemorrhage,  unspecified gastrointestinal hemorrhage type  AKI (acute kidney injury) [facility])    ED Discharge Orders    None       Desma Mcgregor 02/05/18 2333    Hayden Rasmussen, MD 02/06/18 1119

## 2018-02-05 NOTE — Progress Notes (Signed)
New Admission Note:   Arrival Method:  Mental Orientation: Telemetry:  Assessment: Completed Skin: IV: Pain:  Tubes: Safety Measures: Safety Fall Prevention Plan has been given, discussed and signed Admission: Completed [address] Orientation: Patient has been orientated to the room, unit and staff.  Family:  Orders have been reviewed and implemented. Will continue to monitor the patient. Call light has been placed within reach and bed alarm has been activated.   [employer], RN-BC Phone number: [phone]

## 2018-02-05 NOTE — ED Provider Notes (Addendum)
Patient placed in Quick Look pathway, seen and evaluated   Chief Complaint: low hemoglobin  HPI: Patient with complaints of fatigue, lightheadedness with standing presents the emergency department after being found to have a hemoglobin of 7.8 (was 14's 4 months ago).  Patient has a history of atrial fibrillation, status post ablation and is on Xarelto.  He also takes Mobic daily for years.  He denies syncope, chest pain, shortness of breath.  No abdominal pain, hematemesis, hematochezia, or melena.  No urinary symptoms.  States that he has been having fatigue all summer.  He thinks that some of his more chronic symptoms are related to his use of amiodarone.  ROS:  Positive ROS: (+) Fatigue Negative ROS: (-) Abdominal pain, blood in stool  Physical Exam:   Gen: No distress  Neuro: Awake and Alert  Skin: Warm    Focused Exam: Heart RRR, nml W2,N5, soft systolic murmur LSB; Lungs CTAB; Abd soft, NT, no rebound or guarding; Ext 2+ pedal pulses bilaterally, no edema.  BP 135/72   Pulse (!) 58   Temp 97.9 F (36.6 C) (Oral)   Resp 18   SpO2 100%   Plan: Lab work-up initiated.  Suspect he was sent to rule out GI bleed given reported new anemia in setting of NSAID and anticoagulation.  Initiation of care has begun. The patient has been counseled on the process, plan, and necessity for staying for the completion/evaluation, and the remainder of the medical screening examination    Carlisle Cater, PA-C 02/05/18 1533    Carlisle Cater, PA-C 02/05/18 1535    Mesner, Corene Cornea, MD 02/06/18 0045

## 2018-02-05 NOTE — ED Notes (Signed)
Patient states that he wants to leave AMA; very upset that he has been waiting >5 hours for a room. Patient recommend to stay due to abnormal labs. Spoke with charge and nurse in back; patient next back to room.

## 2018-02-05 NOTE — Telephone Encounter (Signed)
Susie @ Dr. Claris Gladden office called and [name] is requesting pt be seen ASAP.   #[phone]

## 2018-02-05 NOTE — ED Triage Notes (Signed)
Pt reports seen at cardiologist yesterday for check-up and blood work. Pt reports he was called today for Hgb of 7.9. Pt reports some fatigue and weakness. Pt denies feeling syncopal, hemachezia, hematemesis, abdominal pain. Pt denies hx of anemia or blood transfusion.

## 2018-02-05 NOTE — Telephone Encounter (Signed)
Tried calling the cardiology office back, unable to reach anyone. We do have available APP appointment on Monday. Please advise.

## 2018-02-05 NOTE — Telephone Encounter (Signed)
Patient advised to go to ED, he will proceed to [facility] for evaluation.

## 2018-02-05 NOTE — Telephone Encounter (Signed)
His Hb is 1/2 what it was 3 months ago. He is 33 with CHF (EF 30%) and is on a blood thinner. I think it is safest if he present to the ER for evaluation, expedited care. I don't think it is safe for him to wait the weekend.

## 2018-02-05 NOTE — H&P (Signed)
History and Physical    Travis [name] RSW:[id number] DOB: 1940-11-28 DOA: 02/05/2018  PCP: Travis Olp, MD   Patient coming from: Home  Chief Complaint: Fatigue, exertional dyspnea, low hemoglobin on outpatient labs  HPI: Travis [name] is a 77 y.o. male with medical history significant for atrial fibrillation on Xarelto, chronic systolic CHF, hypertension, chronic kidney disease stage III, and anxiety, now presenting to the emergency department for evaluation of fatigue, exertional dyspnea, and low hemoglobin on outpatient blood work.  Patient reports that he has noted the insidious development of fatigue, exertional dyspnea, and more recently lightheadedness, worsening over the past couple months.  He denies cough, lower extremity swelling, fevers, or chills.  He also denies abdominal pain, vomiting, melena, or hematochezia.  He reports taking Mobic daily, and also takes a daily omeprazole.  He was seen by cardiology yesterday with increase in his diuretic and decrease in Coreg dose due to bradycardia.  Blood work was ordered and he was notified today of low hemoglobin.  ED Course: Upon arrival to the ED, patient is found to be afebrile, saturating well on room air, slightly bradycardic, and with stable blood pressure.  Chemistry panel is notable for sodium of 131, potassium 5.2, and creatinine of 2.65, up from 1.66 yesterday.  CBC features a hemoglobin of 8.0, down from 14.4 in May.  DRE reportedly revealed brown stool that is FOBT positive.  Gastroenterology was consulted by the ED physician.  1 unit of packed red blood cells was ordered and is currently transfusing.  Patient will be admitted for ongoing evaluation and management of symptomatic anemia and acute kidney injury.   Review of Systems:  All other systems reviewed and apart from HPI, are negative.  Past Medical History:  Diagnosis Date  . Arthritis   . Biatrial enlargement    severe  . Bicuspid aortic valve   . CHF  (congestive heart failure) ([place])    JULY 2014  . Chronic renal insufficiency   . GERD (gastroesophageal reflux disease)   . History of cardioversion 12/16/2012  . History of stomach ulcers   . Hypertension   . Left bundle branch block   . Nonischemic cardiomyopathy ([place])       . Persistent atrial fibrillation ([place])    multiple prior cardioversion  . Sinus bradycardia   . Status post clamping of cerebral aneurysm 79    Past Surgical History:  Procedure Laterality Date  . ATRIAL FIBRILLATION ABLATION N/A 10/06/2017   Procedure: ATRIAL FIBRILLATION ABLATION;  Surgeon: [name], MD;  Location: [facility] LAB;  Service: Cardiovascular;  Laterality: N/A;  . CARDIAC CATHETERIZATION    . CARDIOVERSION N/A 12/16/2012   Procedure: CARDIOVERSION;  Surgeon: Travis Perla, MD;  Location: [facility] ENDOSCOPY;  Service: Cardiovascular;  Laterality: N/A;  . CARDIOVERSION N/A 08/14/2017   Procedure: CARDIOVERSION;  Surgeon: Travis Hector, MD;  Location: [facility] ENDOSCOPY;  Service: Cardiovascular;  Laterality: N/A;  . CARDIOVERSION N/A 09/21/2017   Procedure: CARDIOVERSION;  Surgeon: Travis Klein, MD;  Location: [facility] ENDOSCOPY;  Service: Cardiovascular;  Laterality: N/A;  . CARDIOVERSION N/A 10/13/2017   Procedure: CARDIOVERSION;  Surgeon: Travis Hector, MD;  Location: [facility];  Service: Cardiovascular;  Laterality: N/A;  . CATARACT EXTRACTION  05/2015   bilateral  . cerebral anuersym post clips    . fractured left arm    . HERNIA REPAIR     lft  . INGUINAL HERNIA REPAIR  07/02/2011   Procedure: LAPAROSCOPIC INGUINAL HERNIA;  Surgeon: Travis Bowie, MD;  Location: [facility];  Service: General;  Laterality: Left;  Laparoscopic left inguinal hernia repair and mesh  . left tendon repair     lft foot  . ROTATOR CUFF REPAIR     lf  . TEE WITHOUT CARDIOVERSION N/A 08/14/2017   Procedure: TRANSESOPHAGEAL ECHOCARDIOGRAM (TEE);  Surgeon: Travis Hector, MD;  Location: [facility] ENDOSCOPY;  Service:  Cardiovascular;  Laterality: N/A;  . TONSILLECTOMY    . TOTAL KNEE ARTHROPLASTY Bilateral 02/06/2014   Procedure: TOTAL KNEE BILATERAL;  Surgeon: Travis Pole, MD;  Location: [facility] ORS;  Service: Orthopedics;  Laterality: Bilateral;  . WRIST GANGLION EXCISION     lft     reports that he quit smoking about 31 years ago. He has a 37.50 pack-year smoking history. He has never used smokeless tobacco. He reports that he drinks about 1.0 standard drinks of alcohol per week. He reports that he does not use drugs.  No Known Allergies  Family History  Problem Relation Age of Onset  . Stroke Father 43       smoker  . Lung cancer Mother 59       former smoker  . Stroke Mother   . Stroke Brother   . Colon cancer Neg Hx      Prior to Admission medications   Medication Sig Start Date End Date Taking? Authorizing Provider  acetaminophen (TYLENOL) 500 MG tablet Take 1,000 mg by mouth every 6 (six) hours as needed for mild pain or headache.    Yes [provider]  ALPRAZolam (XANAX) 0.5 MG tablet TAKE 1 TABLET BY MOUTH TWICE DAILY AS NEEDED FOR ANXIETY( SEPARATE BY 8 HOURS FROM TEMAZEPAM) Patient taking differently: Take 0.5 mg by mouth 2 (two) times daily as needed for anxiety (seperate by 8 hours from temazepam).  09/25/17  Yes Travis Olp, MD  amiodarone (PACERONE) 200 MG tablet Take 0.5 tablets (100 mg total) by mouth daily. 01/26/18  Yes Travis Needs, NP  amoxicillin (AMOXIL) 500 MG tablet Take 2,000 mg by mouth once as needed. Dental procedure 02/04/18  Yes [provider]  atorvastatin (LIPITOR) 10 MG tablet Take 1 tablet (10 mg total) by mouth daily. 02/04/18  Yes Travis Dresser, MD  carvedilol (COREG) 3.125 MG tablet Take 1 tablet (3.125 mg total) by mouth 2 (two) times daily with a meal. 02/04/18  Yes Travis Dresser, MD  finasteride (PROPECIA) 1 MG tablet TAKE 1 TABLET(1 MG) BY MOUTH DAILY Patient taking differently: Take 1 mg by mouth daily.  10/05/17  Yes Travis Olp, MD  hydroxypropyl methylcellulose / hypromellose (ISOPTO TEARS / GONIOVISC) 2.5 % ophthalmic solution Place 2 drops into both eyes 3 (three) times daily as needed for dry eyes.   Yes [provider]  losartan (COZAAR) 25 MG tablet Take 0.5 tablets (12.5 mg total) by mouth daily. 10/14/17 10/14/18 Yes Seiler, Amber K, NP  meloxicam (MOBIC) 15 MG tablet TAKE 1 TABLET BY MOUTH DAILY Patient taking differently: Take 15 mg by mouth daily.  01/11/18  Yes Travis Olp, MD  omeprazole (PRILOSEC) 20 MG capsule TAKE 1 CAPSULE BY MOUTH DAILY Patient taking differently: Take 20 mg by mouth daily.  11/25/17  Yes Milus Banister, MD  Rivaroxaban (XARELTO) 15 MG TABS tablet Take 1 tablet (15 mg total) by mouth daily with supper. 09/21/17  Yes Kathyrn Drown D, NP  temazepam (RESTORIL) 15 MG capsule TAKE ONE CAPSULE BY MOUTH AT BEDTIME AS  NEEDED Patient taking differently: Take 15 mg by mouth at bedtime.  11/10/17  Yes Travis Olp, MD  torsemide (DEMADEX) 20 MG tablet Take 2 tabs in AM and 1 tab in PM Patient taking differently: Take 20-40 mg by mouth See admin instructions. Take 2 tablets by mouth in the morning, and 2 tablets at night for 4 days (start 02/04/18), then take 2 tablets in the morning, and 1 tablet at night thereafter for 3 weeks then follow up with Dr. 02/04/18  Yes Travis Dresser, MD    Physical Exam: Vitals:   02/05/18 2200 02/05/18 2232 02/05/18 2233 02/05/18 2249  BP: 125/60 (!) 142/68 (!) 142/68 (!) 140/96  Pulse: 65 68 65 76  Resp:   16 16  Temp:   98.1 F (36.7 C) (!) 97.5 F (36.4 C)  TempSrc:   Oral Oral  SpO2: 98% 100%  100%    Constitutional: NAD, calm Eyes: PERTLA, lids and conjunctivae normal ENMT: Mucous membranes are moist. Posterior pharynx clear of any exudate or lesions.   Neck: normal, supple, no masses, no thyromegaly Respiratory: clear to auscultation bilaterally, no wheezing, no crackles. Normal respiratory effort.    Cardiovascular: S1 &  S2 heard, regular rate and rhythm. No extremity edema. No significant JVD. Abdomen: No distension, no tenderness, soft. Bowel sounds normal.  Musculoskeletal: no clubbing / cyanosis. No joint deformity upper and lower extremities.   Skin: no significant rashes, lesions, ulcers. Warm, dry, well-perfused. Neurologic: CN 2-12 grossly intact. Sensation intact. Strength 5/5 in all 4 limbs.  Psychiatric: Alert and oriented x 3. Calm, cooperative.    Labs on Admission: I have personally reviewed following labs and imaging studies  CBC: Recent Labs  Lab 02/04/18 1035 02/05/18 1533  WBC 7.6 7.1  HGB 7.8* 8.0*  HCT 25.7* 25.6*  MCV 94.5 92.8  PLT 300 818   Basic Metabolic Panel: Recent Labs  Lab 02/04/18 1035 02/05/18 1533  NA 131* 131*  K 4.7 5.2*  CL 99 95*  CO2 24 24  GLUCOSE 131* 118*  BUN 32* 33*  CREATININE 1.66* 2.65*  CALCIUM 8.7* 8.7*   GFR: Estimated Creatinine Clearance: 22.6 mL/min (A) (by C-G formula based on SCr of 2.65 mg/dL (H)). Liver Function Tests: Recent Labs  Lab 02/04/18 1035 02/05/18 1533  AST 22 26  ALT 19 20  ALKPHOS 87 92  BILITOT 0.8 1.1  PROT 6.7 6.9  ALBUMIN 3.6 3.9   No results for input(s): LIPASE, AMYLASE in the last 168 hours. No results for input(s): AMMONIA in the last 168 hours. Coagulation Profile: Recent Labs  Lab 02/05/18 1533  INR 1.83   Cardiac Enzymes: No results for input(s): CKTOTAL, CKMB, CKMBINDEX, TROPONINI in the last 168 hours. BNP (last 3 results) No results for input(s): PROBNP in the last 8760 hours. HbA1C: No results for input(s): HGBA1C in the last 72 hours. CBG: No results for input(s): GLUCAP in the last 168 hours. Lipid Profile: Recent Labs    02/04/18 1035  CHOL 170  HDL 56  LDLCALC 104*  TRIG 50  CHOLHDL 3.0   Thyroid Function Tests: Recent Labs    02/04/18 1035  TSH 4.034   Anemia Panel: No results for input(s): VITAMINB12, FOLATE, FERRITIN, TIBC, IRON, RETICCTPCT in the last 72  hours. Urine analysis:    Component Value Date/Time   COLORURINE AMBER (A) 01/27/2014 1002   APPEARANCEUR CLEAR 01/27/2014 1002   LABSPEC 1.024 01/27/2014 1002   PHURINE 7.0 01/27/2014 1002   GLUCOSEU  NEGATIVE 01/27/2014 1002   HGBUR NEGATIVE 01/27/2014 1002   HGBUR negative 04/05/2009 0802   BILIRUBINUR NEGATIVE 01/27/2014 1002   BILIRUBINUR n 02/03/2011 1315   [place] 01/27/2014 1002   PROTEINUR NEGATIVE 01/27/2014 1002   UROBILINOGEN 1.0 01/27/2014 1002   NITRITE NEGATIVE 01/27/2014 1002   LEUKOCYTESUR NEGATIVE 01/27/2014 1002   Sepsis Labs: @LABRCNTIP (procalcitonin:4,lacticidven:4) )No results found for this or any previous visit (from the past 240 hour(s)).   Radiological Exams on Admission: No results found.  EKG: Not performed.   Assessment/Plan   1. Symptomatic anemia; occult GI bleeding  - Presents with low Hgb on outpatient labs and reports insidiously worsening fatigue and DOE in recent months that has been limiting his activity lately  - Hgb is 8.0, down from 14.4 in May, brown stool on DRE is FOBT+  - He denies abdominal pain, melena, or hematochezia  - He takes Mobic, Xarelto, and daily PPI at home  - There was healed gastric ulcer noted on EGD in 2007  - GI is consulting and much appreciated  - 1 units of RBC ordered from ED and IV PPI started  - Continue IV Protonix q12h, hold Xarelto and Mobic, check post-transfusion CBC    2. Acute kidney injury superimposed on CKD III  - SCr is 2.65 on admission, up from 1.66 the day prior  - He took extra diuretic yesterday and reports good diuresis overnight - Check urine chemistries, hold diuretic and ARB for now, renally-dose medications, repeat chem panel in am    3. Chronic systolic CHF  - Appears compensated, wt is stable   - Diuretic and ARB held in light of AKI on CKD III  - Continue Coreg as tolerated  - Follow daily wt and I/O's, may need diuretic after transfusion    4. Atrial fibrillation   - In sinus rhythm in ED  - CHADS-VASc is 81 (age x2, HTN, CHF) - Continue amiodarone, hold Xarelto in light of GIB with symptomatic anemia    DVT prophylaxis: SCD's, Xarelto prior to admission  Code Status: Full  Family Communication: Discussed with patient   Consults called: GI   Admission status: Inpatient. This is a complicated case of symptomatic anemia with marked drop in Hgb in patient on anticoagulation for a fib, complicated by AKI and CHF. Will need close monitoring of volume-status and renal function while transfusing.     Vianne Bulls, MD [facility] Pager [phone]  If 7PM-7AM, please contact night-coverage [email or web address] Password [email or web address]  02/05/2018, 11:21 PM

## 2018-02-06 ENCOUNTER — Encounter (HOSPITAL_COMMUNITY): Payer: Self-pay

## 2018-02-06 ENCOUNTER — Encounter (HOSPITAL_COMMUNITY)
Admission: EM | Disposition: A | Discharge status: Home / Self Care | Payer: Self-pay | Source: Home / Self Care | Attending: Emergency Medicine

## 2018-02-06 ENCOUNTER — Inpatient Hospital Stay (HOSPITAL_COMMUNITY): Payer: Medicare Other | Admitting: Anesthesiology

## 2018-02-06 DIAGNOSIS — K269 Duodenal ulcer, unspecified as acute or chronic, without hemorrhage or perforation: Secondary | ICD-10-CM

## 2018-02-06 DIAGNOSIS — D649 Anemia, unspecified: Secondary | ICD-10-CM | POA: Diagnosis not present

## 2018-02-06 DIAGNOSIS — I5022 Chronic systolic (congestive) heart failure: Secondary | ICD-10-CM | POA: Diagnosis not present

## 2018-02-06 DIAGNOSIS — K317 Polyp of stomach and duodenum: Secondary | ICD-10-CM | POA: Diagnosis not present

## 2018-02-06 DIAGNOSIS — N179 Acute kidney failure, unspecified: Secondary | ICD-10-CM

## 2018-02-06 DIAGNOSIS — N183 Chronic kidney disease, stage 3 (moderate): Secondary | ICD-10-CM

## 2018-02-06 DIAGNOSIS — F419 Anxiety disorder, unspecified: Secondary | ICD-10-CM | POA: Diagnosis not present

## 2018-02-06 DIAGNOSIS — K315 Obstruction of duodenum: Secondary | ICD-10-CM

## 2018-02-06 DIAGNOSIS — K922 Gastrointestinal hemorrhage, unspecified: Secondary | ICD-10-CM | POA: Diagnosis not present

## 2018-02-06 DIAGNOSIS — I130 Hypertensive heart and chronic kidney disease with heart failure and stage 1 through stage 4 chronic kidney disease, or unspecified chronic kidney disease: Secondary | ICD-10-CM | POA: Diagnosis not present

## 2018-02-06 DIAGNOSIS — I481 Persistent atrial fibrillation: Secondary | ICD-10-CM | POA: Diagnosis not present

## 2018-02-06 DIAGNOSIS — K264 Chronic or unspecified duodenal ulcer with hemorrhage: Secondary | ICD-10-CM | POA: Diagnosis not present

## 2018-02-06 DIAGNOSIS — D509 Iron deficiency anemia, unspecified: Secondary | ICD-10-CM | POA: Diagnosis not present

## 2018-02-06 DIAGNOSIS — K219 Gastro-esophageal reflux disease without esophagitis: Secondary | ICD-10-CM | POA: Diagnosis not present

## 2018-02-06 DIAGNOSIS — Z8711 Personal history of peptic ulcer disease: Secondary | ICD-10-CM | POA: Diagnosis not present

## 2018-02-06 HISTORY — PX: BIOPSY: SHX5522

## 2018-02-06 HISTORY — PX: ESOPHAGOGASTRODUODENOSCOPY: SHX5428

## 2018-02-06 LAB — URINALYSIS, ROUTINE W REFLEX MICROSCOPIC
Bilirubin Urine: NEGATIVE
Glucose, UA: NEGATIVE mg/dL
Hgb urine dipstick: NEGATIVE
Ketones, ur: NEGATIVE mg/dL
Leukocytes, UA: NEGATIVE
Nitrite: NEGATIVE
Protein, ur: NEGATIVE mg/dL
Specific Gravity, Urine: 1.011 (ref 1.005–1.030)
pH: 7 (ref 5.0–8.0)

## 2018-02-06 LAB — CREATININE, SERUM
CREATININE: 1.45 mg/dL — AB (ref 0.61–1.24)
GFR, EST AFRICAN AMERICAN: 52 mL/min — AB (ref >60)
GFR, EST NON AFRICAN AMERICAN: 45 mL/min — AB (ref >60)

## 2018-02-06 LAB — BPAM RBC
BLOOD PRODUCT EXPIRATION DATE: 201910112359
ISSUE DATE / TIME: 201909132221
Unit Type and Rh: 5100

## 2018-02-06 LAB — HEMOGLOBIN AND HEMATOCRIT, BLOOD
HEMATOCRIT: 27 % — AB (ref 39.0–52.0)
HEMOGLOBIN: 8.6 g/dL — AB (ref 13.0–17.0)

## 2018-02-06 LAB — TYPE AND SCREEN
ABO/RH(D): O POS
Antibody Screen: NEGATIVE
Unit division: 0

## 2018-02-06 LAB — BASIC METABOLIC PANEL
Anion gap: 10 (ref 5–15)
BUN: 36 mg/dL — ABNORMAL HIGH (ref 8–23)
CO2: 25 mmol/L (ref 22–32)
CREATININE: 2.07 mg/dL — AB (ref 0.61–1.24)
Calcium: 8.6 mg/dL — ABNORMAL LOW (ref 8.9–10.3)
Chloride: 100 mmol/L (ref 98–111)
GFR calc non Af Amer: 29 mL/min — ABNORMAL LOW (ref >60)
GFR, EST AFRICAN AMERICAN: 34 mL/min — AB (ref >60)
Glucose, Bld: 88 mg/dL (ref 70–99)
Potassium: 4.6 mmol/L (ref 3.5–5.1)
Sodium: 135 mmol/L (ref 135–145)

## 2018-02-06 LAB — IRON AND TIBC
Iron: 17 ug/dL — ABNORMAL LOW (ref 45–182)
SATURATION RATIOS: 4 % — AB (ref 17.9–39.5)
TIBC: 458 ug/dL — ABNORMAL HIGH (ref 250–450)
UIBC: 441 ug/dL

## 2018-02-06 LAB — VITAMIN B12: Vitamin B-12: 184 pg/mL (ref 180–914)

## 2018-02-06 LAB — FERRITIN: FERRITIN: 78 ng/mL (ref 24–336)

## 2018-02-06 LAB — SODIUM, URINE, RANDOM: Sodium, Ur: 44 mmol/L

## 2018-02-06 LAB — CREATININE, URINE, RANDOM: CREATININE, URINE: 75.24 mg/dL

## 2018-02-06 SURGERY — EGD (ESOPHAGOGASTRODUODENOSCOPY)
Anesthesia: Monitor Anesthesia Care

## 2018-02-06 SURGERY — EGD (ESOPHAGOGASTRODUODENOSCOPY)
Anesthesia: Monitor Anesthesia Care | Laterality: Left

## 2018-02-06 MED ORDER — PANTOPRAZOLE SODIUM 40 MG PO TBEC: 40.0000 mg | DELAYED_RELEASE_TABLET | Freq: Two times a day (BID) | ORAL | 2 refills | Status: DC

## 2018-02-06 MED ORDER — PROPOFOL 500 MG/50ML IV EMUL
INTRAVENOUS | Status: DC | PRN
  Administered 2018-02-06: 100 ug/kg/min via INTRAVENOUS

## 2018-02-06 MED ORDER — PHENYLEPHRINE 40 MCG/ML (10ML) SYRINGE FOR IV PUSH (FOR BLOOD PRESSURE SUPPORT)
PREFILLED_SYRINGE | INTRAVENOUS | Status: DC | PRN
  Administered 2018-02-06 (×3): 80 ug via INTRAVENOUS

## 2018-02-06 MED ORDER — PANTOPRAZOLE SODIUM 40 MG PO TBEC: 40.0000 mg | DELAYED_RELEASE_TABLET | Freq: Two times a day (BID) | ORAL | Status: DC

## 2018-02-06 MED ORDER — PROPOFOL 10 MG/ML IV BOLUS
INTRAVENOUS | Status: DC | PRN
  Administered 2018-02-06 (×4): 20 mg via INTRAVENOUS

## 2018-02-06 MED ORDER — SODIUM CHLORIDE 0.9 % IV SOLN: INTRAVENOUS | Status: DC | PRN

## 2018-02-06 NOTE — Anesthesia Postprocedure Evaluation (Signed)
Anesthesia Post Note  Patient: TREYVON BLAHUT  Procedure(s) Performed: ESOPHAGOGASTRODUODENOSCOPY (EGD) (N/A )     Patient location during evaluation: PACU Anesthesia Type: MAC Level of consciousness: awake and alert Pain management: pain level controlled Vital Signs Assessment: post-procedure vital signs reviewed and stable Respiratory status: spontaneous breathing, nonlabored ventilation, respiratory function stable and patient connected to nasal cannula oxygen Cardiovascular status: stable and blood pressure returned to baseline Postop Assessment: no apparent nausea or vomiting Anesthetic complications: no    Last Vitals:  Vitals:   02/06/18 0845 02/06/18 0942  BP: (!) 121/48 128/61  Pulse: (!) 55 (!) 51  Resp: 16 18  Temp:  36.5 C  SpO2: 96% 96%    Last Pain:  Vitals:   02/06/18 0942  TempSrc: Oral  PainSc:                  Griffin Gerrard DAVID

## 2018-02-06 NOTE — Care Management CC44 (Signed)
Condition Code 44 Documentation Completed  Patient Details  Name: Travis [name] MRN: [id number] Date of Birth: 03/09/1941   Condition Code 44 given:  Yes Patient signature on Condition Code 44 notice:  Yes Documentation of 2 MD's agreement:  Yes Code 44 added to claim:  Yes    Claudie Leach, RN 02/06/2018, 4:13 PM

## 2018-02-06 NOTE — Procedures (Addendum)
Recommendations: -Okay to resume full liquid diet now and can slowly advance as tolerated to previous diet as an outpatient - Protonix 40 mg p.o. twice daily for 8 weeks to aid in mucosal healing -Stop all NSAIDs - Will need alternative therapy for arthralgias given high clinical suspicion for NSAID diaphragm - Repeat EGD in 6 to 8 weeks to assess for mucosal healing.  If stricture still present despite trial of high-dose acid suppression therapy, we will plan on endoscopic balloon dilation - Repeat CBC and iron panel in approximately 8 weeks.  If again/still iron deficient, will discuss further evaluation for occult GI blood loss to include colonoscopy - We will hold off on VCE until stricture either resolved or dilated - Okay for discharge later today from a GI standpoint - We will call patient on Monday when clinic is open to schedule follow-up appointment and repeat endoscopy scheduling  Communicated the endoscopic findings and the recommendations with primary service  Gerrit Heck, DO, [facility]

## 2018-02-06 NOTE — Progress Notes (Signed)
Consultation  Referring Provider:     Vianne Bulls, MD Primary Care Physician:  [name], MD Primary Gastroenterologist:    Owens Loffler, MD Reason for Consultation:     Symptomatic anemia         HPI:   Travis [name] is a 77 y.o. male with a history of atrial fibrillation on Xarelto, CHF (EF 30%), bicuspid bowel, moderate aortic stenosis, hypertension, CKD stage III, peptic ulcer disease, GERD presenting to the emergency department last evening with progressive exertional dyspnea, fatigue.  He was seen by his cardiologist earlier this week and CBC notable for hemoglobin 7.8, down from 14 in May 2019.  Repeat hemoglobin in the ER was 8 with MCV/RDW 93/15.8 (was 98.6/15.1 in May and 100.5/14.30 August 2017).  Is otherwise without overt GI blood loss and denies any abdominal pain, nausea, vomiting.  He does have a long history of taking Mobic.  He has a history of peptic ulcer disease with ulcer on EGD in 2007.  Follow-up EGD after course of acid suppression therapy noted resolution of gastric ulcer later that year.  He has since had no peptic ulcer disease and GERD well controlled with daily PPI therapy.  He was admitted and transfused 1 PRBCs overnight with repeat hemoglobin 8.6 this morning.  Iron indices notable for iron deficiency anemia with a normal vitamin B12.  Past Medical History:  Diagnosis Date  . Arthritis   . Biatrial enlargement    severe  . Bicuspid aortic valve   . CHF (congestive heart failure) ([place])    JULY 2014  . Chronic renal insufficiency   . GERD (gastroesophageal reflux disease)   . History of cardioversion 12/16/2012  . History of stomach ulcers   . Hypertension   . Left bundle branch block   . Nonischemic cardiomyopathy ([place])       . Persistent atrial fibrillation ([place])    multiple prior cardioversion  . Sinus bradycardia   . Status post clamping of cerebral aneurysm 79    Past Surgical History:  Procedure Laterality Date  . ATRIAL  FIBRILLATION ABLATION N/A 10/06/2017   Procedure: ATRIAL FIBRILLATION ABLATION;  Surgeon: [name], MD;  Location: [facility];  Service: Cardiovascular;  Laterality: N/A;  . CARDIAC CATHETERIZATION    . CARDIOVERSION N/A 12/16/2012   Procedure: CARDIOVERSION;  Surgeon: Lelon Perla, MD;  Location: [facility] ENDOSCOPY;  Service: Cardiovascular;  Laterality: N/A;  . CARDIOVERSION N/A 08/14/2017   Procedure: CARDIOVERSION;  Surgeon: Josue Hector, MD;  Location: [facility] ENDOSCOPY;  Service: Cardiovascular;  Laterality: N/A;  . CARDIOVERSION N/A 09/21/2017   Procedure: CARDIOVERSION;  Surgeon: Sanda Klein, MD;  Location: [facility] ENDOSCOPY;  Service: Cardiovascular;  Laterality: N/A;  . CARDIOVERSION N/A 10/13/2017   Procedure: CARDIOVERSION;  Surgeon: Josue Hector, MD;  Location: [place];  Service: Cardiovascular;  Laterality: N/A;  . CATARACT EXTRACTION  05/2015   bilateral  . cerebral anuersym post clips    . fractured left arm    . HERNIA REPAIR     lft  . INGUINAL HERNIA REPAIR  07/02/2011   Procedure: LAPAROSCOPIC INGUINAL HERNIA;  Surgeon: Harl Bowie, MD;  Location: [place];  Service: General;  Laterality: Left;  Laparoscopic left inguinal hernia repair and mesh  . left tendon repair     lft foot  . ROTATOR CUFF REPAIR     lf  . TEE WITHOUT CARDIOVERSION N/A 08/14/2017   Procedure: TRANSESOPHAGEAL ECHOCARDIOGRAM (TEE);  Surgeon: Johnsie Cancel,  Wallis Bamberg, MD;  Location: [facility] ENDOSCOPY;  Service: Cardiovascular;  Laterality: N/A;  . TONSILLECTOMY    . TOTAL KNEE ARTHROPLASTY Bilateral 02/06/2014   Procedure: TOTAL KNEE BILATERAL;  Surgeon: Mauri Pole, MD;  Location: [facility] OR[facility];  Service: Orthopedics;  Laterality: Bilateral;  . WRIST GANGLION EXCISION     lft    Family History  Problem Relation Age of Onset  . Stroke Father 1       smoker  . Lung cancer Mother 41       former smoker  . Stroke Mother   . Stroke Brother   . Colon cancer Neg Hx      Social History   Tobacco  Use  . Smoking status: Former Smoker    Packs/day: 1.50    Years: 25.00    Pack years: 37.50    Last attempt to quit: 05/26/1986    Years since quitting: 31.7  . Smokeless tobacco: Never Used  Substance Use Topics  . Alcohol use: Yes    Alcohol/week: 1.0 standard drinks    Types: 1 Standard drinks or equivalent per week    Comment: stopped drinking   . Drug use: No    Prior to Admission medications   Medication Sig Start Date End Date Taking? Authorizing Provider  acetaminophen (TYLENOL) 500 MG tablet Take 1,000 mg by mouth every 6 (six) hours as needed for mild pain or headache.    Yes [provider]  ALPRAZolam (XANAX) 0.5 MG tablet TAKE 1 TABLET BY MOUTH TWICE DAILY AS NEEDED FOR ANXIETY( SEPARATE BY 8 HOURS FROM TEMAZEPAM) Patient taking differently: Take 0.5 mg by mouth 2 (two) times daily as needed for anxiety (seperate by 8 hours from temazepam).  09/25/17  Yes [name], MD  amiodarone (PACERONE) 200 MG tablet Take 0.5 tablets (100 mg total) by mouth daily. 01/26/18  Yes Sherran Needs, NP  amoxicillin (AMOXIL) 500 MG tablet Take 2,000 mg by mouth once as needed. Dental procedure 02/04/18  Yes [provider]  atorvastatin (LIPITOR) 10 MG tablet Take 1 tablet (10 mg total) by mouth daily. 02/04/18  Yes Larey Dresser, MD  carvedilol (COREG) 3.125 MG tablet Take 1 tablet (3.125 mg total) by mouth 2 (two) times daily with a meal. 02/04/18  Yes Larey Dresser, MD  finasteride (PROPECIA) 1 MG tablet TAKE 1 TABLET(1 MG) BY MOUTH DAILY Patient taking differently: Take 1 mg by mouth daily.  10/05/17  Yes [name], MD  hydroxypropyl methylcellulose / hypromellose (ISOPTO TEARS / GONIOVISC) 2.5 % ophthalmic solution Place 2 drops into both eyes 3 (three) times daily as needed for dry eyes.   Yes [provider]  losartan (COZAAR) 25 MG tablet Take 0.5 tablets (12.5 mg total) by mouth daily. 10/14/17 10/14/18 Yes Seiler, Amber K, NP  meloxicam (MOBIC)  15 MG tablet TAKE 1 TABLET BY MOUTH DAILY Patient taking differently: Take 15 mg by mouth daily.  01/11/18  Yes [name], MD  omeprazole (PRILOSEC) 20 MG capsule TAKE 1 CAPSULE BY MOUTH DAILY Patient taking differently: Take 20 mg by mouth daily.  11/25/17  Yes Milus Banister, MD  Rivaroxaban (XARELTO) 15 MG TABS tablet Take 1 tablet (15 mg total) by mouth daily with supper. 09/21/17  Yes Kathyrn Drown D, NP  temazepam (RESTORIL) 15 MG capsule TAKE ONE CAPSULE BY MOUTH AT BEDTIME AS NEEDED Patient taking differently: Take 15 mg by mouth at bedtime.  11/10/17  Yes [name],  MD  torsemide (DEMADEX) 20 MG tablet Take 2 tabs in AM and 1 tab in PM Patient taking differently: Take 20-40 mg by mouth See admin instructions. Take 2 tablets by mouth in the morning, and 2 tablets at night for 4 days (start 02/04/18), then take 2 tablets in the morning, and 1 tablet at night thereafter for 3 weeks then follow up with Dr. 02/04/18  Yes Larey Dresser, MD    Current Facility-Administered Medications  Medication Dose Route Frequency Provider Last Rate Last Dose  . [MAR Hold] 0.9 %  sodium chloride infusion (Manually program via Guardrails IV Fluids)   Intravenous Once Opyd, Timothy S, MD      . 0.9 %  sodium chloride infusion   Intravenous Continuous Annemarie Sebree V, DO      . [MAR Hold] 0.9 %  sodium chloride infusion  250 mL Intravenous PRN Opyd, Ilene Qua, MD      . Doug Sou Hold] acetaminophen (TYLENOL) tablet 650 mg  650 mg Oral Q6H PRN Opyd, Ilene Qua, MD       Or  . Doug Sou Hold] acetaminophen (TYLENOL) suppository 650 mg  650 mg Rectal Q6H PRN Opyd, Ilene Qua, MD      . Doug Sou Hold] ALPRAZolam Duanne Moron) tablet 0.5 mg  0.5 mg Oral BID PRN Opyd, Ilene Qua, MD      . Doug Sou Hold] amiodarone (PACERONE) tablet 100 mg  100 mg Oral Daily Opyd, Ilene Qua, MD      . Doug Sou Hold] atorvastatin (LIPITOR) tablet 10 mg  10 mg Oral q1800 Opyd, Ilene Qua, MD      . Doug Sou Hold] carvedilol (COREG) tablet 3.125 mg   3.125 mg Oral BID WC Opyd, Ilene Qua, MD      . Doug Sou Hold] hydroxypropyl methylcellulose / hypromellose (ISOPTO TEARS / GONIOVISC) 2.5 % ophthalmic solution 2 drop  2 drop Both Eyes TID PRN Opyd, Ilene Qua, MD      . Doug Sou Hold] ondansetron (ZOFRAN) tablet 4 mg  4 mg Oral Q6H PRN Opyd, Ilene Qua, MD       Or  . Doug Sou Hold] ondansetron (ZOFRAN) injection 4 mg  4 mg Intravenous Q6H PRN Opyd, Ilene Qua, MD      . Doug Sou Hold] pantoprazole (PROTONIX) injection 40 mg  40 mg Intravenous Q12H Opyd, Ilene Qua, MD   40 mg at 02/06/18 0030  . [MAR Hold] sodium chloride flush (NS) 0.9 % injection 3 mL  3 mL Intravenous Q12H Opyd, Ilene Qua, MD      . Doug Sou Hold] sodium chloride flush (NS) 0.9 % injection 3 mL  3 mL Intravenous Q12H Opyd, Ilene Qua, MD   3 mL at 02/06/18 0032  . [MAR Hold] sodium chloride flush (NS) 0.9 % injection 3 mL  3 mL Intravenous PRN Opyd, Ilene Qua, MD      . Doug Sou Hold] temazepam (RESTORIL) capsule 15 mg  15 mg Oral QHS PRN Opyd, Ilene Qua, MD        Allergies as of 02/05/2018  . (No Known Allergies)     Review of Systems:    As per HPI, otherwise negative    Physical Exam:  Vital signs in last 24 hours: Temp:  [97.5 F (36.4 C)-98.1 F (36.7 C)] 98.1 F (36.7 C) (09/14 0734) Pulse Rate:  [58-76] 58 (09/14 0734) Resp:  [12-20] 14 (09/14 0734) BP: (122-156)/(54-96) 138/61 (09/14 0734) SpO2:  [94 %-100 %] 97 % (09/14 0734) Weight:  [76.2 kg] 76.2 kg (09/13  2359)   General:   Pleasant male in NAD Head:  Normocephalic and atraumatic. Eyes:   No icterus.   Conjunctiva pink. Ears:  Normal auditory acuity. Neck:  Supple Lungs:  Respirations even and unlabored. Lungs clear to auscultation bilaterally.   No wheezes, crackles, or rhonchi.  Heart:  Regular rate and rhythm; no MRG Abdomen:  Soft, nondistended, nontender. Normal bowel sounds. No appreciable masses or hepatomegaly.  Rectal:  Not performed.  Msk:  Symmetrical without gross deformities.  Extremities:  Without  edema. Neurologic:  Alert and  oriented x4;  grossly normal neurologically. Skin:  Intact without significant lesions or rashes. Psych:  Alert and cooperative. Normal affect.  LAB RESULTS: Recent Labs    02/04/18 1035 02/05/18 1533 02/06/18 0344  WBC 7.6 7.1  --   HGB 7.8* 8.0* 8.6*  HCT 25.7* 25.6* 27.0*  PLT 300 305  --    BMET Recent Labs    02/04/18 1035 02/05/18 1533 02/06/18 0344  NA 131* 131* 135  K 4.7 5.2* 4.6  CL 99 95* 100  CO2 24 24 25   GLUCOSE 131* 118* 88  BUN 32* 33* 36*  CREATININE 1.66* 2.65* 2.07*  CALCIUM 8.7* 8.7* 8.6*   LFT Recent Labs    02/05/18 1533  PROT 6.9  ALBUMIN 3.9  AST 26  ALT 20  ALKPHOS 92  BILITOT 1.1   PT/INR Recent Labs    02/05/18 1533  LABPROT 21.0*  INR 1.83    STUDIES: No results found.   PREVIOUS ENDOSCOPIES:            Colonoscopy (2007): Diverticulosis, otherwise normal and no polyps EGD (2007): Gastric ulcer.  Repeat EGD later that year demonstrates healing of gastric ulcer.   Impression / Plan:   Travis [name] is a 77 year old male with a history of atrial fibrillation on Xarelto along with CHF, hypertension and a previous history of peptic ulcer disease in 2007, presenting with symptomatic iron deficiency anemia without overt GI blood loss.  High clinical index of suspicion for occult upper GI bleed we will plan on EGD today for diagnostic and therapeutic intent.  The indications, risks, and benefits of EGD were explained to the patient in detail. Risks include but are not limited to bleeding, perforation, adverse reaction to medications, and cardiopulmonary compromise. Sequelae include but are not limited to the possibility of surgery, hositalization, and mortality. The patient verbalized understanding and wished to proceed. All questions answered. Further recommendations pending results of the exam.     LOS: 1 day   Lavena Bullion  02/06/2018, 7:55 AM

## 2018-02-06 NOTE — Transfer of Care (Signed)
Immediate Anesthesia Transfer of Care Note  Patient: Travis [name]  Procedure(s) Performed: ESOPHAGOGASTRODUODENOSCOPY (EGD) (N/A )  Patient Location: Endoscopy Unit  Anesthesia Type:MAC  Level of Consciousness: awake, alert  and oriented  Airway & Oxygen Therapy: Patient Spontanous Breathing  Post-op Assessment: Report given to RN and Post -op Vital signs reviewed and stable  Post vital signs: Reviewed and stable  Last Vitals:  Vitals Value Taken Time  BP 98/44 02/06/2018  8:33 AM  Temp    Pulse 57 02/06/2018  8:34 AM  Resp 17 02/06/2018  8:34 AM  SpO2 92 % 02/06/2018  8:34 AM  Vitals shown include unvalidated device data.  Last Pain:  Vitals:   02/06/18 0734  TempSrc: Oral  PainSc: 0-No pain         Complications: No apparent anesthesia complications

## 2018-02-06 NOTE — Op Note (Signed)
[facility] Patient Name: Travis [name] Procedure Date : 02/06/2018 MRN: [id number] Attending MD: Gerrit Heck , MD Date of Birth: March 18, 1941 CSN: [id number] Age: 77 Admit Type: Inpatient Procedure:                Upper GI endoscopy Indications:              Iron deficiency anemia due to suspected upper                            gastrointestinal bleeding                           77 yo male with history of Atrial Fibrillation on                            Xarelto along with CHF and previous PUD presents                            with symptomatic iron deficiency anemia without                            overt GI blood loss. Providers:                Gerrit Heck, MD, Zenon Mayo, RN, Cletis Athens,                            Technician, Clearnce Sorrel, CRNA Referring MD:              Medicines:                Monitored Anesthesia Care Complications:            No immediate complications. Estimated Blood Loss:     Estimated blood loss was minimal. Procedure:                Pre-Anesthesia Assessment:                           - Prior to the procedure, a History and Physical                            was performed, and patient medications and                            allergies were reviewed. The patient's tolerance of                            previous anesthesia was also reviewed. The risks                            and benefits of the procedure and the sedation                            options and risks were discussed with the patient.  All questions were answered, and informed consent                            was obtained. Prior Anticoagulants: The patient has                            taken Xarelto (rivaroxaban), last dose was 1 day                            prior to procedure. ASA Grade Assessment: III - A                            patient with severe systemic disease. After                            reviewing the risks and  benefits, the patient was                            deemed in satisfactory condition to undergo the                            procedure.                           After obtaining informed consent, the endoscope was                            passed under direct vision. Throughout the                            procedure, the patient's blood pressure, pulse, and                            oxygen saturations were monitored continuously. The                            GIF-H190 ([id number]) Olympus adult EGD was introduced                            through the mouth, and advanced to the second part                            of duodenum. The GIF-[id number] ([id number]) Olympus                            Ultraslim EGD was introduced through the mouth, and                            advanced to the third part of duodenum. The upper                            GI endoscopy was accomplished without difficulty.  The patient tolerated the procedure well. Scope In: Scope Out: Findings:      The examined esophagus was normal.      Multiple small sessile polyps with no bleeding and no stigmata of recent       bleeding were found in the gastric fundus and in the gastric body. The       appearance and location were consistent with benign fundic gland polyps.      Normal mucosa was otherwise found in the entire examined stomach.      The duodenal bulb was normal.      An acquired benign-appearing, intrinsic moderate stenosis was found in       the second portion of the duodenum and was non-traversed. The endoscopic       appearance is consistent with NSAID Diaphragm. The scope was withdrawn       and replaced with the pediatric endoscope (ultraslim scope) in order to       accomplish the maneuver. This was traversed with the smaller endoscope.       Biopsies were taken with a cold forceps for histology. Estimated blood       loss was minimal.      One non-bleeding superficial duodenal  ulcer with no stigmata of bleeding       was found in the second portion of the duodenum adjacent to the       stricture. The lesion was 2 mm in largest dimension. Biopsies were taken       with a cold forceps for histology. Estimated blood loss was minimal.      The third portion of the duodenum was normal. Impression:               - Normal esophagus.                           - Multiple gastric polyps.                           - Normal mucosa was found in the entire stomach.                           - Normal duodenal bulb.                           - Acquired duodenal stenosis in the second portion                            of the duodenum consistent with NSAID Diaphragm                            Biopsied.                           - One non-bleeding duodenal ulcer with no stigmata                            of bleeding. Biopsied.                           - Normal third portion of the duodenum.                           -  No blood noted in the lumen. Recommendation:           - Return patient to hospital ward for ongoing care.                           - Full liquid diet today.                           - Use Protonix (pantoprazole) 40 mg PO BID for 8                            weeks to promote mucosal healing and potentially                            reduce/resolve duodenal stricture.                           - Await pathology results.                           - Repeat upper endoscopy in 6 weeks to check                            healing. If stricture still present, recommend                            endoscopic balloon dilation at that time. Hold                            Xarelto x2 days prior to that repeat study.                           - Repeat CBC and iron panel in 6-8 weeks. If again                            iron deficient and still no overt GI blood loss,                            and depending on repeat EGD findings, recommend                             outpatient colonoscopy to continue evaluation for                            occult GI bleed. Would hold off on VCE until NSAID                            Diaphragm either improves with medical therapy or                            after endoscopic dilation. Procedure Code(s):        --- Professional ---  10272, Esophagogastroduodenoscopy, flexible,                            transoral; with biopsy, single or multiple Diagnosis Code(s):        --- Professional ---                           K31.7, Polyp of stomach and duodenum                           K31.5, Obstruction of duodenum                           K26.9, Duodenal ulcer, unspecified as acute or                            chronic, without hemorrhage or perforation                           D50.9, Iron deficiency anemia, unspecified CPT copyright 2017 [employer]. All rights reserved. The codes documented in this report are preliminary and upon coder review may  be revised to meet current compliance requirements. Gerrit Heck, MD 02/06/2018 8:38:29 AM Number of Addenda: 0

## 2018-02-06 NOTE — Anesthesia Preprocedure Evaluation (Signed)
Anesthesia Evaluation  Patient identified by MRN, date of birth, ID band Patient awake    Reviewed: Allergy & Precautions, NPO status , Patient's Chart, lab work & pertinent test results  Airway Mallampati: I  TM Distance: >3 FB Neck ROM: Full    Dental   Pulmonary former smoker,    Pulmonary exam normal        Cardiovascular hypertension, Pt. on medications Normal cardiovascular exam+ dysrhythmias Atrial Fibrillation      Neuro/Psych Anxiety    GI/Hepatic GERD  Medicated and Controlled,  Endo/Other    Renal/GU Renal InsufficiencyRenal disease     Musculoskeletal   Abdominal   Peds  Hematology   Anesthesia Other Findings   Reproductive/Obstetrics                             Anesthesia Physical Anesthesia Plan  ASA: III  Anesthesia Plan: MAC   Post-op Pain Management:    Induction: Intravenous  PONV Risk Score and Plan: 1 and Ondansetron  Airway Management Planned: Simple Face Mask  Additional Equipment:   Intra-op Plan:   Post-operative Plan:   Informed Consent: I have reviewed the patients History and Physical, chart, labs and discussed the procedure including the risks, benefits and alternatives for the proposed anesthesia with the patient or authorized representative who has indicated his/her understanding and acceptance.     Plan Discussed with: CRNA and Surgeon  Anesthesia Plan Comments:         Anesthesia Quick Evaluation

## 2018-02-07 ENCOUNTER — Encounter (HOSPITAL_COMMUNITY): Payer: Self-pay | Admitting: Gastroenterology

## 2018-02-07 LAB — UREA NITROGEN, URINE: UREA NITROGEN UR: 539 mg/dL

## 2018-02-07 NOTE — Discharge Summary (Signed)
Physician Discharge Summary  CHARLE CLEAR FIE:[id number] DOB: 1940/07/17 DOA: 02/05/2018  PCP: [name], MD  Admit date: 02/05/2018 Discharge date: 02/06/2018  Admitted From: [facility].  Disposition:  Home   Recommendations for Outpatient Follow-up:  1. Follow up with PCP in 1-2 weeks 2. Please obtain BMP/CBC in one week Please follow up gastroenterology as recommended.    Discharge Condition:stable.  CODE STATUS: full code.  Diet recommendation: Heart Healthy   Brief/Interim Summary: Travis [name] is a 77 y.o. male with medical history significant for atrial fibrillation on Xarelto, chronic systolic CHF, hypertension, chronic kidney disease stage III, and anxiety, now presenting to the emergency department for evaluation of fatigue, exertional dyspnea, and low hemoglobin on outpatient blood work.  Patient reports that he has noted the insidious development of fatigue, exertional dyspnea, and more recently lightheadedness, worsening over the past couple months. Upon arrival to the ED, patient is found to be afebrile, saturating well on room air, slightly bradycardic, and with stable blood pressure.  Chemistry panel is notable for sodium of 131, potassium 5.2, and creatinine of 2.65, up from 1.66 yesterday.  CBC features a hemoglobin of 8.0, down from 14.4 in May.  DRE reportedly revealed brown stool that is FOBT positive.  Gastroenterology was consulted by the ED physician.  1 unit of packed red blood cells was ordered. He underwent EGD showing a small ulcer. He was cleared for discharge by GI and he can go back on his xarelto as per GI.     Discharge Diagnoses:  Principal Problem:   Symptomatic anemia Active Problems:   Hypertension   Hyponatremia   Sinus bradycardia   Persistent atrial fibrillation (HCC)   Chronic systolic heart failure (HCC)   Occult GI bleeding   Acute renal failure superimposed on stage 3 chronic kidney disease (HCC)   GI bleed  Occult GI  bleeding: Hemoglobin drop from 14 to 8 this admission.  Stool positive for blood .  He underwent EGD showing non bleeding duodenal ulcer and duodenal stenosis in the second portion of the duodenum from NSAIDS.  He is cleared for discharge by GI to continue with PPI for 8 weeks and repeat egd in 6 weeks.  He is cleared to resume xarelto on discharge.    AKI; superimposed on CKD stage 3: Appears to be pre renal / dehydration.  Gently hydrated and repeat rnal parameters improved to baseline of 1.4.    Chronic systolic heart failure: Resume home meds on discharge.    Atrial fibrillation;  Rate controlled.  Resume xarelto on discharge as per GI.    Hypertension; well controlled.    Acute anemia of blood loss S/p 1 unit of prbc transfusion and rpeat H&H is 8.6.  Recommend to check hemoglobin on Monday .  Follow up with GI as recommended.    Discharge Instructions  Discharge Instructions    Diet - low sodium heart healthy   Complete by:  As directed    Discharge instructions   Complete by:  As directed    Please follow up with PCP in one week.  Please check your CBC in one week.  Take protonix BID for 8 weeks. Please follow up with gastroenterology in 6 weeks for repeat EGD.  Please follow up with gastroenterology regarding the biopsy results.     Allergies as of 02/06/2018   No Known Allergies     Medication List    STOP taking these medications   meloxicam 15 MG tablet Commonly  known as:  MOBIC   omeprazole 20 MG capsule Commonly known as:  PRILOSEC     TAKE these medications   acetaminophen 500 MG tablet Commonly known as:  TYLENOL Take 1,000 mg by mouth every 6 (six) hours as needed for mild pain or headache.   ALPRAZolam 0.5 MG tablet Commonly known as:  XANAX TAKE 1 TABLET BY MOUTH TWICE DAILY AS NEEDED FOR ANXIETY( SEPARATE BY 8 HOURS FROM TEMAZEPAM) What changed:  See the new instructions.   amiodarone 200 MG tablet Commonly known as:   PACERONE Take 0.5 tablets (100 mg total) by mouth daily.   amoxicillin 500 MG tablet Commonly known as:  AMOXIL Take 2,000 mg by mouth once as needed. Dental procedure   atorvastatin 10 MG tablet Commonly known as:  LIPITOR Take 1 tablet (10 mg total) by mouth daily.   carvedilol 3.125 MG tablet Commonly known as:  COREG Take 1 tablet (3.125 mg total) by mouth 2 (two) times daily with a meal.   finasteride 1 MG tablet Commonly known as:  PROPECIA TAKE 1 TABLET(1 MG) BY MOUTH DAILY What changed:  See the new instructions.   hydroxypropyl methylcellulose / hypromellose 2.5 % ophthalmic solution Commonly known as:  ISOPTO TEARS / GONIOVISC Place 2 drops into both eyes 3 (three) times daily as needed for dry eyes.   losartan 25 MG tablet Commonly known as:  COZAAR Take 0.5 tablets (12.5 mg total) by mouth daily.   pantoprazole 40 MG tablet Commonly known as:  PROTONIX Take 1 tablet (40 mg total) by mouth 2 (two) times daily.   Rivaroxaban 15 MG Tabs tablet Commonly known as:  XARELTO Take 1 tablet (15 mg total) by mouth daily with supper.   temazepam 15 MG capsule Commonly known as:  RESTORIL TAKE ONE CAPSULE BY MOUTH AT BEDTIME AS NEEDED What changed:  when to take this   torsemide 20 MG tablet Commonly known as:  DEMADEX Take 2 tabs in AM and 1 tab in PM What changed:    how much to take  how to take this  when to take this  additional instructions      Follow-up Information    [name], MD. Schedule an appointment as soon as possible for a visit in 1 week(s).   Specialty:  Family Medicine Contact information: [place] [address] [phone]        Deboraha Sprang, MD .   Specialty:  Cardiology Contact information: [phone] [place] [address] [phone]          No Known Allergies   Consultations:  Gastroenterology.    Procedures/Studies:  No results  found.    Subjective: No nausea, vomiting or abd pain.   Discharge Exam: Vitals:   02/06/18 0845 02/06/18 0942  BP: (!) 121/48 128/61  Pulse: (!) 55 (!) 51  Resp: 16 18  Temp:  97.7 F (36.5 C)  SpO2: 96% 96%   Vitals:   02/06/18 0734 02/06/18 0834 02/06/18 0845 02/06/18 0942  BP: 138/61 (!) 98/44 (!) 121/48 128/61  Pulse: (!) 58 (!) 52 (!) 55 (!) 51  Resp: 14 17 16 18   Temp: 98.1 F (36.7 C) 97.8 F (36.6 C)  97.7 F (36.5 C)  TempSrc: Oral Oral  Oral  SpO2: 97% 94% 96% 96%  Weight:      Height:        General: Pt is alert, awake, not in acute distress Cardiovascular:  RRR, S1/S2 +, no rubs, no gallops Respiratory: CTA bilaterally, no wheezing, no rhonchi Abdominal: Soft, NT, ND, bowel sounds + Extremities: no edema, no cyanosis    The results of significant diagnostics from this hospitalization (including imaging, microbiology, ancillary and laboratory) are listed below for reference.     Microbiology: No results found for this or any previous visit (from the past 240 hour(s)).   Labs: BNP (last 3 results) Recent Labs    08/12/17 1531 09/17/17 1313 10/10/17 0912  BNP 649.3* 1,399.3* 9,449.6*   Basic Metabolic Panel: Recent Labs  Lab 02/04/18 1035 02/05/18 1533 02/06/18 0344 02/06/18 1311  NA 131* 131* 135  --   K 4.7 5.2* 4.6  --   CL 99 95* 100  --   CO2 24 24 25   --   GLUCOSE 131* 118* 88  --   BUN 32* 33* 36*  --   CREATININE 1.66* 2.65* 2.07* 1.45*  CALCIUM 8.7* 8.7* 8.6*  --    Liver Function Tests: Recent Labs  Lab 02/04/18 1035 02/05/18 1533  AST 22 26  ALT 19 20  ALKPHOS 87 92  BILITOT 0.8 1.1  PROT 6.7 6.9  ALBUMIN 3.6 3.9   No results for input(s): LIPASE, AMYLASE in the last 168 hours. No results for input(s): AMMONIA in the last 168 hours. CBC: Recent Labs  Lab 02/04/18 1035 02/05/18 1533 02/06/18 0344  WBC 7.6 7.1  --   HGB 7.8* 8.0* 8.6*  HCT 25.7* 25.6* 27.0*  MCV 94.5 92.8  --   PLT 300 305  --     Cardiac Enzymes: No results for input(s): CKTOTAL, CKMB, CKMBINDEX, TROPONINI in the last 168 hours. BNP: Invalid input(s): POCBNP CBG: No results for input(s): GLUCAP in the last 168 hours. D-Dimer No results for input(s): DDIMER in the last 72 hours. Hgb A1c No results for input(s): HGBA1C in the last 72 hours. Lipid Profile Recent Labs    02/04/18 1035  CHOL 170  HDL 56  LDLCALC 104*  TRIG 50  CHOLHDL 3.0   Thyroid function studies Recent Labs    02/04/18 1035  TSH 4.034   Anemia work up Recent Labs    02/05/18 2241  VITAMINB12 184  FERRITIN 78  TIBC 458*  IRON 17*   Urinalysis    Component Value Date/Time   COLORURINE YELLOW 02/05/2018 [place] 02/05/2018 2332   LABSPEC 1.011 02/05/2018 2332   PHURINE 7.0 02/05/2018 2332   GLUCOSEU NEGATIVE 02/05/2018 2332   HGBUR NEGATIVE 02/05/2018 2332   HGBUR negative 04/05/2009 0802   BILIRUBINUR NEGATIVE 02/05/2018 2332   BILIRUBINUR n 02/03/2011 1315   KETONESUR NEGATIVE 02/05/2018 2332   PROTEINUR NEGATIVE 02/05/2018 2332   UROBILINOGEN 1.0 01/27/2014 1002   NITRITE NEGATIVE 02/05/2018 2332   LEUKOCYTESUR NEGATIVE 02/05/2018 2332   Sepsis Labs Invalid input(s): PROCALCITONIN,  WBC,  LACTICIDVEN Microbiology No results found for this or any previous visit (from the past 240 hour(s)).   Time coordinating discharge: 32  minutes  SIGNED:   Hosie Poisson, MD  [facility] 02/07/2018, 9:37 AM Pager   If 7PM-7AM, please contact night-coverage [email or web address] Password [email or web address]

## 2018-02-08 ENCOUNTER — Telehealth: Payer: Self-pay

## 2018-02-08 ENCOUNTER — Telehealth: Payer: Self-pay | Admitting: *Deleted

## 2018-02-08 NOTE — Telephone Encounter (Signed)
noted thanks  

## 2018-02-08 NOTE — Telephone Encounter (Signed)
Per chart review: Admit date: 02/05/2018 Discharge date: 02/06/2018  Admitted From: [facility].  Disposition:  Home   Recommendations for Outpatient Follow-up:  1. Follow up with PCP in 1-2 weeks 2. Please obtain BMP/CBC in one week Please follow up gastroenterology as recommended.    Discharge Condition:stable.  CODE STATUS: full code.  Diet recommendation: Heart Healthy _______________________________________________________________ Per telephone call: Transition Care Management Follow-up Telephone Call   Date discharged? 02/06/18   How have you been since you were released from the hospital? "[facility], very tired" Patient states he has been fatigued since his procedure in June. States he thought it was due to his cardiac history. States he now believes it is due to his GI bleed. States he was taken off of Meloxicam and hopes that will help.    Do you understand why you were in the hospital? yes   Do you understand the discharge instructions? yes   Where were you discharged to? Home   Items Reviewed:  Medications reviewed: yes  Allergies reviewed: yes  Dietary changes reviewed: yes  Referrals reviewed: yes   Functional Questionnaire:   Activities of Daily Living (ADLs):   He states they are independent in the following: ambulation, bathing and hygiene, feeding, continence, grooming, toileting and dressing States they require assistance with the following: none   Any transportation issues/concerns?: no   Any patient concerns? no   Confirmed importance and date/time of follow-up visits scheduled yes  Provider Appointment booked with Dr Yong Channel 02/10/18 11:15  Confirmed with patient if condition begins to worsen call PCP or go to the ER.  Patient was given the office number and encouraged to call back with question or concerns.  : yes

## 2018-02-08 NOTE — Telephone Encounter (Signed)
-----   Message from Milus Banister, MD sent at 02/08/2018  6:52 AM EDT ----- Fransisca Connors,  Thanks for the help with him.   Dravin Lance He needs EGD in 6-8 weeks, CBC in 4 weeks.  Remind him to absolutely avoid all NSAIDs for now.  Thanks   ----- Message ----- From: [name], PA-C Sent: 02/06/2018  11:50 AM EDT To: Milus Banister, MD, Timothy Lasso, RN  Patient had EGD with Dr. Bryan Lemma on 9/14.  He said he needs a repeat EGD in 6-8 weeks with Dr. Ardis Hughs.  Dr. Ardis Hughs, do you just want him scheduled for a direct EGD or do you want him seen in the office first?  Thank you,  Jess

## 2018-02-08 NOTE — Telephone Encounter (Signed)
-----   Message from [name], PA-C sent at 02/06/2018 11:50 AM EDT ----- Patient had EGD with Dr. Bryan Lemma on 9/14.  He said he needs a repeat EGD in 6-8 weeks with Dr. Ardis Hughs.  Dr. Ardis Hughs, do you just want him scheduled for a direct EGD or do you want him seen in the office first?  Thank you,  Jess

## 2018-02-10 ENCOUNTER — Ambulatory Visit (INDEPENDENT_AMBULATORY_CARE_PROVIDER_SITE_OTHER): Payer: Medicare Other | Admitting: Family Medicine

## 2018-02-10 ENCOUNTER — Encounter: Payer: Self-pay | Admitting: Family Medicine

## 2018-02-10 ENCOUNTER — Encounter: Payer: Self-pay | Admitting: Gastroenterology

## 2018-02-10 VITALS — BP 108/60 | HR 51 | Temp 97.7°F | Ht 68.0 in | Wt 166.4 lb

## 2018-02-10 DIAGNOSIS — N179 Acute kidney failure, unspecified: Secondary | ICD-10-CM

## 2018-02-10 DIAGNOSIS — I481 Persistent atrial fibrillation: Secondary | ICD-10-CM

## 2018-02-10 DIAGNOSIS — M150 Primary generalized (osteo)arthritis: Secondary | ICD-10-CM | POA: Diagnosis not present

## 2018-02-10 DIAGNOSIS — K922 Gastrointestinal hemorrhage, unspecified: Secondary | ICD-10-CM

## 2018-02-10 DIAGNOSIS — I5022 Chronic systolic (congestive) heart failure: Secondary | ICD-10-CM

## 2018-02-10 DIAGNOSIS — N183 Chronic kidney disease, stage 3 (moderate): Secondary | ICD-10-CM

## 2018-02-10 DIAGNOSIS — I4819 Other persistent atrial fibrillation: Secondary | ICD-10-CM

## 2018-02-10 DIAGNOSIS — I10 Essential (primary) hypertension: Secondary | ICD-10-CM | POA: Diagnosis not present

## 2018-02-10 DIAGNOSIS — M159 Polyosteoarthritis, unspecified: Secondary | ICD-10-CM

## 2018-02-10 LAB — CBC
HEMATOCRIT: 29 % — AB (ref 39.0–52.0)
HEMOGLOBIN: 9.6 g/dL — AB (ref 13.0–17.0)
MCHC: 33.3 g/dL (ref 30.0–36.0)
MCV: 87.6 fl (ref 78.0–100.0)
Platelets: 326 10*3/uL (ref 150.0–400.0)
RBC: 3.31 Mil/uL — ABNORMAL LOW (ref 4.22–5.81)
RDW: 16.9 % — AB (ref 11.5–15.5)
WBC: 6.9 10*3/uL (ref 4.0–10.5)

## 2018-02-10 LAB — BASIC METABOLIC PANEL
BUN: 42 mg/dL — ABNORMAL HIGH (ref 6–23)
CALCIUM: 9.4 mg/dL (ref 8.4–10.5)
CO2: 29 meq/L (ref 19–32)
CREATININE: 1.8 mg/dL — AB (ref 0.40–1.50)
Chloride: 93 mEq/L — ABNORMAL LOW (ref 96–112)
GFR: 39.01 mL/min — ABNORMAL LOW (ref >60.00)
Glucose, Bld: 116 mg/dL — ABNORMAL HIGH (ref 70–99)
Potassium: 4.2 mEq/L (ref 3.5–5.1)
Sodium: 131 mEq/L — ABNORMAL LOW (ref 135–145)

## 2018-02-10 NOTE — Patient Instructions (Addendum)
Please stop by lab before you go  No changes planned for now

## 2018-02-10 NOTE — Assessment & Plan Note (Signed)
Patient appears euvolemic on torsemide 20 mg- he is on 2 tablets in the morning and 1 tablet in the evening.  Continue current dose and cardiology follow-up

## 2018-02-10 NOTE — Assessment & Plan Note (Signed)
Stop meloxicam due to GI bleed.  He is not currently having pain.  We discussed possible Celebrex but with cardiac issues I would prefer use of tramadol sparingly.  Also he states not significant improvement on Celebrex in the past.  I am willing to call in tramadol if needed

## 2018-02-10 NOTE — Assessment & Plan Note (Addendum)
Update CBC today.  Was not actively bleeding in the hospital-is on appropriate high-dose PPI for gastric ulcer. Knows to avoid nsaids at this point.  Planned GI follow-up for repeat EGD in 6 to 8 weeks

## 2018-02-10 NOTE — Progress Notes (Signed)
Subjective:  Travis [name] is a 77 y.o. year old very pleasant male patient who presents for transitional care management and hospital follow up for symptomatic anemia. Patient was hospitalized from February 05, 2018 to February 06, 2018. A TCM phone call was completed on February 08, 2018. Medical complexity moderate  Patient with a history of atrial fibrillation on Xarelto, chronic systolic congestive heart failure, hypertension, chronic kidney disease stage III, and arthritis on NSAIDs in the past who presented to the emergency department for evaluation of fatigue, exertional dyspnea and due to low hemoglobin found during outpatient blood work.  Patient traces his story back to at least May when he had an ablation.  He followed up with cardiology in June and admitted to some fatigue and states was told to come back in a month.  In July he complained of fatigue and no energy and they recommended close follow-up.  By September when he followed up they decided to reduce his amiodarone by cutting the dose in half to see if that helps him.  His fatigue did not improve and he had blood work done which showed a hemoglobin dropped from 14 in may to 7.8.  Hospital discharge paperwork reviewed and summarized below.  Emergency room work-up showed sodium of 131 and slightly high potassium at 5.2 both which normalized by discharge.  Most notable was creatinine up to 2.65 from baseline closer to 1.4-1.6.  As noted he has baseline chronic kidney disease stage III which improved back to baseline before discharge.   This was likely prerenal in etiology.  CBC was up to 8 at time of his emergency room visit.  Rectal exam showed brown stool but it was fecal occult blood test positive.  GI was consulted.  Patient was admitted.  He was given 1 unit of packed red blood cells.  He had an EGD completed which showed a small ulceration which was not actively bleeding.  His hemoglobin improved to 8.6 before discharge.  GI noted  that he could restart his Xarelto.  He was advised to have a CBC and BMP at follow-up.  Patient was discharged on Protonix 40 mg twice a day, his home omeprazole 20 mg was discontinued.  The plan is for 2 months of higher PPI dose.  He will have repeat EGD in 6 to 8 weeks  Patient was told to avoid future meloxicam for his hand arthritis.  He was told there are other options which may help.  He states he is taken Celebrex in the past and that did not help as much as the Mobic.  He states fortunately he has not had a return of his pain yet in his hands.  We did discuss possibly using tramadol.  He denies any melena or bright red blood per rectum at present.  Patient states his fatigue has improved slightly.   See problem oriented charting as well ROS-admits to some fatigue but improved.  Some shortness of breath but improved.  No edema.  No chest pain.  Past Medical History-  Patient Active Problem List   Diagnosis Date Noted  . GI bleed 02/06/2018    Priority: High  . Chronic systolic heart failure ([place]) 10/10/2017    Priority: High  . Persistent atrial fibrillation ([place]) 10/06/2017    Priority: High  . Pulmonary hypertension ([place]) 03/09/2012    Priority: High  . Bicuspid aortic valve     Priority: High  . Cardiomyopathy, rate related with resolution now recurrent 12/08/2011  Priority: High  . Hyperlipidemia 12/19/2014    Priority: Medium  . Anxiety state 03/06/2014    Priority: Medium  . Insomnia 03/06/2014    Priority: Medium  . Sinus bradycardia 01/04/2013    Priority: Medium  . Hyponatremia 02/21/2012    Priority: Medium  . Hypertension     Priority: Medium  . Left bundle branch block 12/10/2010    Priority: Medium  . Symptomatic anemia 08/01/2015    Priority: Low  . Male pattern baldness 03/06/2014    Priority: Low  . Overweight (BMI 25.0-29.9) 02/08/2014    Priority: Low  . S/P TKR (total knee replacement) 02/08/2014    Priority: Low  . DRY MOUTH 11/07/2009     Priority: Low  . Carotid bruit 07/05/2009    Priority: Low  . GERD 01/07/2008    Priority: Low  . Osteoarthritis 02/04/2007    Priority: Low  . GANGLION CYST, WRIST, LEFT 02/04/2007    Priority: Low    Medications- reviewed and updated  A medical reconciliation was performed comparing current medicines to hospital discharge medications. Current Outpatient Medications  Medication Sig Dispense Refill  . acetaminophen (TYLENOL) 500 MG tablet Take 1,000 mg by mouth every 6 (six) hours as needed for mild pain or headache.     . ALPRAZolam (XANAX) 0.5 MG tablet TAKE 1 TABLET BY MOUTH TWICE DAILY AS NEEDED FOR ANXIETY( SEPARATE BY 8 HOURS FROM TEMAZEPAM) (Patient taking differently: Take 0.5 mg by mouth 2 (two) times daily as needed for anxiety (seperate by 8 hours from temazepam). ) 5 tablet 0  . amiodarone (PACERONE) 200 MG tablet Take 0.5 tablets (100 mg total) by mouth daily. 90 tablet 2  . amoxicillin (AMOXIL) 500 MG tablet Take 2,000 mg by mouth once as needed. Dental procedure  4  . atorvastatin (LIPITOR) 10 MG tablet Take 1 tablet (10 mg total) by mouth daily. 30 tablet 6  . carvedilol (COREG) 3.125 MG tablet Take 1 tablet (3.125 mg total) by mouth 2 (two) times daily with a meal. 180 tablet 3  . finasteride (PROPECIA) 1 MG tablet TAKE 1 TABLET(1 MG) BY MOUTH DAILY (Patient taking differently: Take 1 mg by mouth daily. ) 30 tablet 5  . hydroxypropyl methylcellulose / hypromellose (ISOPTO TEARS / GONIOVISC) 2.5 % ophthalmic solution Place 2 drops into both eyes 3 (three) times daily as needed for dry eyes.    Marland Kitchen losartan (COZAAR) 25 MG tablet Take 0.5 tablets (12.5 mg total) by mouth daily. 30 tablet 11  . pantoprazole (PROTONIX) 40 MG tablet Take 1 tablet (40 mg total) by mouth 2 (two) times daily. 60 tablet 2  . Rivaroxaban (XARELTO) 15 MG TABS tablet Take 1 tablet (15 mg total) by mouth daily with supper. 60 tablet 2  . temazepam (RESTORIL) 15 MG capsule TAKE ONE CAPSULE BY MOUTH AT  BEDTIME AS NEEDED (Patient taking differently: Take 15 mg by mouth at bedtime. ) 30 capsule 5  . torsemide (DEMADEX) 20 MG tablet Take 2 tabs in AM and 1 tab in PM (Patient taking differently: Take 20-40 mg by mouth See admin instructions. Take 2 tablets by mouth in the morning, and 2 tablets at night for 4 days (start 02/04/18), then take 2 tablets in the morning, and 1 tablet at night thereafter for 3 weeks then follow up with Dr.) 90 tablet 2   No current facility-administered medications for this visit.     Objective: BP 108/60 (BP Location: Left Arm, Patient Position: Sitting, Cuff Size: Large)  Pulse (!) 51   Temp 97.7 F (36.5 C) (Oral)   Ht 5\' 8"  (1.727 m)   Wt 166 lb 6.4 oz (75.5 kg)   SpO2 94%   BMI 25.30 kg/m  Gen: NAD, resting comfortably Mucus membrane pallor noted CV: bradycardic but regular 3/6 SEM Lungs: CTAB no crackles, wheeze, rhonchi Abdomen: soft/nontender/nondistended/normal bowel sounds.  Ext: no edema Skin: warm, dry  Assessment/Plan:   Persistent atrial fibrillation ([place]) Patient appears to be remaining in sinus bradycardia though obviously this cannot be proved without EKG.  He is back on his Xarelto at renally adjusted dose.  He is on amiodarone 100 mg daily.  He is on Coreg 3.125 mg twice daily.  Continue current medications  Chronic systolic heart failure ([place]) Patient appears euvolemic on torsemide 20 mg- he is on 2 tablets in the morning and 1 tablet in the evening.  Continue current dose and cardiology follow-up  GI bleed Update CBC today.  Was not actively bleeding in the hospital-is on appropriate high-dose PPI for gastric ulcer. Knows to avoid nsaids at this point.  Planned GI follow-up for repeat EGD in 6 to 8 weeks  Acute renal failure superimposed on stage 3 chronic kidney disease ([place]) Appears to have resolved before hospital discharge.  We will repeat bmet today to ensure stability  Osteoarthritis Stop meloxicam due to GI bleed.  He is  not currently having pain.  We discussed possible Celebrex but with cardiac issues I would prefer use of tramadol sparingly.  Also he states not significant improvement on Celebrex in the past.  I am willing to call in tramadol if needed  Future Appointments  Date Time Provider [place]  02/15/2018 10:30 AM [facility] LAB [facility] None  02/26/2018  9:00 AM Larey Dresser, MD [facility] None  02/07/2019  2:00 PM [facility] HEALTH COACH [facility]   Lab/Order associations: Essential hypertension - Plan: CBC, Basic metabolic panel  Persistent atrial fibrillation (HCC)  Chronic systolic heart failure (HCC)  Gastrointestinal hemorrhage, unspecified gastrointestinal hemorrhage type  Acute renal failure superimposed on stage 3 chronic kidney disease, unspecified acute renal failure type ([place])  Primary osteoarthritis involving multiple joints  Return precautions advised.  [name], MD

## 2018-02-10 NOTE — Assessment & Plan Note (Signed)
Appears to have resolved before hospital discharge.  We will repeat bmet today to ensure stability

## 2018-02-10 NOTE — Telephone Encounter (Signed)
EF 50% per 02/04/18 Echo. He is on xarelto.  Does he need to hold? Can he be done in the [place]?

## 2018-02-10 NOTE — Assessment & Plan Note (Addendum)
Patient appears to be remaining in sinus bradycardia though obviously this cannot be proved without EKG.  He is back on his Xarelto at renally adjusted dose.  He is on amiodarone 100 mg daily.  He is on Coreg 3.125 mg twice daily.  Continue current medications

## 2018-02-10 NOTE — Telephone Encounter (Signed)
He is safe for [facility] EGD in 6-8 weeks, should hold xarelto 2 days prior. Will need his cardiologist OK on that [place] hold.  Remind him he should be on protonix twice daily until that EGD.  Thanks

## 2018-02-10 NOTE — Telephone Encounter (Signed)
Pt aware, recall in Epic.  The schedule is not out yet for 8 weeks out.  Letter sent to cardiology regarding xarelto hold.

## 2018-02-15 ENCOUNTER — Telehealth: Payer: Self-pay | Admitting: Physician Assistant

## 2018-02-15 ENCOUNTER — Ambulatory Visit (HOSPITAL_COMMUNITY)
Admission: RE | Admit: 2018-02-15 | Discharge: 2018-02-15 | Disposition: A | Discharge status: Home / Self Care | Payer: Medicare Other | Source: Ambulatory Visit | Attending: Internal Medicine | Admitting: Internal Medicine

## 2018-02-15 DIAGNOSIS — I5022 Chronic systolic (congestive) heart failure: Secondary | ICD-10-CM | POA: Insufficient documentation

## 2018-02-15 LAB — BASIC METABOLIC PANEL
Anion gap: 13 (ref 5–15)
BUN: 37 mg/dL — ABNORMAL HIGH (ref 8–23)
CHLORIDE: 94 mmol/L — AB (ref 98–111)
CO2: 27 mmol/L (ref 22–32)
Calcium: 9 mg/dL (ref 8.9–10.3)
Creatinine, Ser: 1.78 mg/dL — ABNORMAL HIGH (ref 0.61–1.24)
GFR, EST AFRICAN AMERICAN: 41 mL/min — AB (ref >60)
GFR, EST NON AFRICAN AMERICAN: 35 mL/min — AB (ref >60)
Glucose, Bld: 115 mg/dL — ABNORMAL HIGH (ref 70–99)
Potassium: 3.9 mmol/L (ref 3.5–5.1)
SODIUM: 134 mmol/L — AB (ref 135–145)

## 2018-02-15 NOTE — Telephone Encounter (Signed)
   Primary Cardiologist: [name], MD  I am covering pre-op clinic today. There is a message dated 02/10/18 to the "[place] charts section" regarding pre-op clearance. Because this came in the form of a cc'd chart, I cannot tell if this was acted on.   [facility] Pre-operative Risk Assessment    Request for surgical clearance:     Endoscopy Procedure  What type of surgery is being performed?     EGD  When is this surgery scheduled?    2 months out   What type of clearance is required ?   Pharmacy  Are there any medications that need to be held prior to surgery and how long? Xarelto  Practice name and name of physician performing surgery?      [facility]  What is your office phone and fax number?      Phone- [phone]  [phone]  Anesthesia type (None, local, MAC, general) ?       MAC   Patient has follow-up with [name] on 02/26/18 at which time this can be addressed - [name] will need to cc his chart to requesting party. Will route his way and close phone note. Will also request GI place these requests in the form of phone note instead of [place] chart so they don't get lost.  Charlie Pitter, PA-C 02/15/2018, 5:24 PM

## 2018-02-16 ENCOUNTER — Telehealth: Payer: Self-pay

## 2018-02-16 NOTE — Telephone Encounter (Signed)
Pt takes Xarelto 15mg  daily for afib with CHADS2VASc score of 5 (age x2, CHF, HTN, CAD). Pt on appropriate dose of Xarelto given CrCl of 95mL/min. Recommend holding Xarelto for 2 days (3 ok if necessary due to reduced renal function) prior to procedure.

## 2018-02-16 NOTE — Telephone Encounter (Signed)
   [facility] [address] [place], [place]  [address] Phone:  [phone]   Fax:  [phone]   [facility] Pre-operative Risk Assessment     Request for surgical clearance:     Endoscopy Procedure  What type of surgery is being performed?     EGD  When is this surgery scheduled?    2 months out   What type of clearance is required ?   Pharmacy  Are there any medications that need to be held prior to surgery and how long? Xarelto  Practice name and name of physician performing surgery?      [facility]  What is your office phone and fax number?      Phone- [phone]  [phone]  Anesthesia type (None, local, MAC, general) ?       MAC

## 2018-02-16 NOTE — Telephone Encounter (Signed)
Thank you I just resent it the correct way.  I thought that I had sent it the correct way.  Thank you for your patience.

## 2018-02-17 NOTE — Telephone Encounter (Signed)
See attached

## 2018-02-17 NOTE — Telephone Encounter (Signed)
[name], just FYI that pt needs EGD  Please let me know if you have any concerns.  Thanks.

## 2018-02-17 NOTE — Telephone Encounter (Signed)
He can do as Network engineer suggests for holding Xarelto.

## 2018-02-26 ENCOUNTER — Telehealth (HOSPITAL_COMMUNITY): Payer: Self-pay | Admitting: *Deleted

## 2018-02-26 ENCOUNTER — Ambulatory Visit (HOSPITAL_BASED_OUTPATIENT_CLINIC_OR_DEPARTMENT_OTHER)
Admission: RE | Admit: 2018-02-26 | Discharge: 2018-02-26 | Disposition: A | Discharge status: Home / Self Care | Payer: Medicare Other | Source: Ambulatory Visit | Attending: Cardiology | Admitting: Cardiology

## 2018-02-26 ENCOUNTER — Encounter (HOSPITAL_COMMUNITY): Payer: Self-pay | Admitting: Cardiology

## 2018-02-26 ENCOUNTER — Other Ambulatory Visit: Payer: Self-pay

## 2018-02-26 ENCOUNTER — Encounter (HOSPITAL_COMMUNITY): Payer: Self-pay | Admitting: Emergency Medicine

## 2018-02-26 ENCOUNTER — Inpatient Hospital Stay (HOSPITAL_COMMUNITY)
Admission: EM | Admit: 2018-02-26 | Discharge: 2018-03-01 | DRG: 378 | Disposition: A | Discharge status: Home / Self Care | Payer: Medicare Other | Attending: Internal Medicine | Admitting: Internal Medicine

## 2018-02-26 VITALS — BP 106/52 | HR 65 | Wt 169.5 lb

## 2018-02-26 DIAGNOSIS — K315 Obstruction of duodenum: Secondary | ICD-10-CM | POA: Diagnosis not present

## 2018-02-26 DIAGNOSIS — I5022 Chronic systolic (congestive) heart failure: Secondary | ICD-10-CM | POA: Diagnosis not present

## 2018-02-26 DIAGNOSIS — I130 Hypertensive heart and chronic kidney disease with heart failure and stage 1 through stage 4 chronic kidney disease, or unspecified chronic kidney disease: Secondary | ICD-10-CM | POA: Diagnosis present

## 2018-02-26 DIAGNOSIS — Q231 Congenital insufficiency of aortic valve: Secondary | ICD-10-CM | POA: Diagnosis not present

## 2018-02-26 DIAGNOSIS — E785 Hyperlipidemia, unspecified: Secondary | ICD-10-CM

## 2018-02-26 DIAGNOSIS — I4819 Other persistent atrial fibrillation: Secondary | ICD-10-CM

## 2018-02-26 DIAGNOSIS — I2510 Atherosclerotic heart disease of native coronary artery without angina pectoris: Secondary | ICD-10-CM | POA: Insufficient documentation

## 2018-02-26 DIAGNOSIS — Z791 Long term (current) use of non-steroidal anti-inflammatories (NSAID): Secondary | ICD-10-CM

## 2018-02-26 DIAGNOSIS — I6529 Occlusion and stenosis of unspecified carotid artery: Secondary | ICD-10-CM | POA: Diagnosis present

## 2018-02-26 DIAGNOSIS — I480 Paroxysmal atrial fibrillation: Secondary | ICD-10-CM | POA: Diagnosis not present

## 2018-02-26 DIAGNOSIS — I340 Nonrheumatic mitral (valve) insufficiency: Secondary | ICD-10-CM | POA: Insufficient documentation

## 2018-02-26 DIAGNOSIS — Z7901 Long term (current) use of anticoagulants: Secondary | ICD-10-CM | POA: Insufficient documentation

## 2018-02-26 DIAGNOSIS — Z79899 Other long term (current) drug therapy: Secondary | ICD-10-CM

## 2018-02-26 DIAGNOSIS — I447 Left bundle-branch block, unspecified: Secondary | ICD-10-CM | POA: Diagnosis present

## 2018-02-26 DIAGNOSIS — N183 Chronic kidney disease, stage 3 (moderate): Secondary | ICD-10-CM | POA: Diagnosis not present

## 2018-02-26 DIAGNOSIS — D62 Acute posthemorrhagic anemia: Secondary | ICD-10-CM | POA: Diagnosis not present

## 2018-02-26 DIAGNOSIS — K922 Gastrointestinal hemorrhage, unspecified: Secondary | ICD-10-CM

## 2018-02-26 DIAGNOSIS — Z87891 Personal history of nicotine dependence: Secondary | ICD-10-CM

## 2018-02-26 DIAGNOSIS — F419 Anxiety disorder, unspecified: Secondary | ICD-10-CM | POA: Diagnosis present

## 2018-02-26 DIAGNOSIS — K219 Gastro-esophageal reflux disease without esophagitis: Secondary | ICD-10-CM | POA: Diagnosis present

## 2018-02-26 DIAGNOSIS — I10 Essential (primary) hypertension: Secondary | ICD-10-CM | POA: Diagnosis present

## 2018-02-26 DIAGNOSIS — D649 Anemia, unspecified: Secondary | ICD-10-CM | POA: Diagnosis not present

## 2018-02-26 DIAGNOSIS — K259 Gastric ulcer, unspecified as acute or chronic, without hemorrhage or perforation: Secondary | ICD-10-CM | POA: Diagnosis present

## 2018-02-26 DIAGNOSIS — K264 Chronic or unspecified duodenal ulcer with hemorrhage: Principal | ICD-10-CM | POA: Diagnosis present

## 2018-02-26 DIAGNOSIS — N179 Acute kidney failure, unspecified: Secondary | ICD-10-CM | POA: Diagnosis present

## 2018-02-26 DIAGNOSIS — I080 Rheumatic disorders of both mitral and aortic valves: Secondary | ICD-10-CM | POA: Diagnosis present

## 2018-02-26 DIAGNOSIS — Z96653 Presence of artificial knee joint, bilateral: Secondary | ICD-10-CM | POA: Diagnosis present

## 2018-02-26 DIAGNOSIS — Z8679 Personal history of other diseases of the circulatory system: Secondary | ICD-10-CM

## 2018-02-26 DIAGNOSIS — I428 Other cardiomyopathies: Secondary | ICD-10-CM | POA: Diagnosis not present

## 2018-02-26 DIAGNOSIS — K311 Adult hypertrophic pyloric stenosis: Secondary | ICD-10-CM | POA: Diagnosis not present

## 2018-02-26 DIAGNOSIS — I350 Nonrheumatic aortic (valve) stenosis: Secondary | ICD-10-CM

## 2018-02-26 DIAGNOSIS — K317 Polyp of stomach and duodenum: Secondary | ICD-10-CM | POA: Diagnosis present

## 2018-02-26 DIAGNOSIS — K269 Duodenal ulcer, unspecified as acute or chronic, without hemorrhage or perforation: Secondary | ICD-10-CM | POA: Diagnosis present

## 2018-02-26 LAB — PREPARE RBC (CROSSMATCH)

## 2018-02-26 LAB — FOLATE: FOLATE: 15 ng/mL (ref >5.9)

## 2018-02-26 LAB — PROTIME-INR
INR: 1.99
PROTHROMBIN TIME: 22.4 s — AB (ref 11.4–15.2)

## 2018-02-26 LAB — RETICULOCYTES
RBC.: 2.49 MIL/uL — AB (ref 4.22–5.81)
RETIC COUNT ABSOLUTE: 59.8 10*3/uL (ref 19.0–186.0)
Retic Ct Pct: 2.4 % (ref 0.4–3.1)

## 2018-02-26 LAB — CBC
HCT: 22.2 % — ABNORMAL LOW (ref 39.0–52.0)
HEMATOCRIT: 22.2 % — AB (ref 39.0–52.0)
HEMOGLOBIN: 6.8 g/dL — AB (ref 13.0–17.0)
Hemoglobin: 6.7 g/dL — CL (ref 13.0–17.0)
MCH: 26.5 pg (ref 26.0–34.0)
MCH: 26.7 pg (ref 26.0–34.0)
MCHC: 30.2 g/dL (ref 30.0–36.0)
MCHC: 30.6 g/dL (ref 30.0–36.0)
MCV: 87.7 fL (ref 78.0–100.0)
MCV: 87.1 fL (ref 78.0–100.0)
Platelets: 286 10*3/uL (ref 150–400)
Platelets: 295 10*3/uL (ref 150–400)
RBC: 2.53 MIL/uL — ABNORMAL LOW (ref 4.22–5.81)
RBC: 2.55 MIL/uL — AB (ref 4.22–5.81)
RDW: 16.3 % — AB (ref 11.5–15.5)
RDW: 16.5 % — AB (ref 11.5–15.5)
WBC: 6.6 10*3/uL (ref 4.0–10.5)
WBC: 6 10*3/uL (ref 4.0–10.5)

## 2018-02-26 LAB — COMPREHENSIVE METABOLIC PANEL
ALT: 19 U/L (ref 0–44)
AST: 23 U/L (ref 15–41)
Albumin: 3.6 g/dL (ref 3.5–5.0)
Alkaline Phosphatase: 75 U/L (ref 38–126)
Anion gap: 10 (ref 5–15)
BILIRUBIN TOTAL: 0.7 mg/dL (ref 0.3–1.2)
BUN: 24 mg/dL — ABNORMAL HIGH (ref 8–23)
CHLORIDE: 94 mmol/L — AB (ref 98–111)
CO2: 27 mmol/L (ref 22–32)
Calcium: 8.5 mg/dL — ABNORMAL LOW (ref 8.9–10.3)
Creatinine, Ser: 1.55 mg/dL — ABNORMAL HIGH (ref 0.61–1.24)
GFR calc Af Amer: 48 mL/min — ABNORMAL LOW (ref >60)
GFR, EST NON AFRICAN AMERICAN: 41 mL/min — AB (ref >60)
Glucose, Bld: 136 mg/dL — ABNORMAL HIGH (ref 70–99)
POTASSIUM: 3.4 mmol/L — AB (ref 3.5–5.1)
Sodium: 131 mmol/L — ABNORMAL LOW (ref 135–145)
TOTAL PROTEIN: 6.7 g/dL (ref 6.5–8.1)

## 2018-02-26 LAB — BASIC METABOLIC PANEL
ANION GAP: 10 (ref 5–15)
BUN: 20 mg/dL (ref 8–23)
CALCIUM: 8.7 mg/dL — AB (ref 8.9–10.3)
CHLORIDE: 95 mmol/L — AB (ref 98–111)
CO2: 28 mmol/L (ref 22–32)
CREATININE: 1.35 mg/dL — AB (ref 0.61–1.24)
GFR calc non Af Amer: 49 mL/min — ABNORMAL LOW (ref >60)
GFR, EST AFRICAN AMERICAN: 57 mL/min — AB (ref >60)
Glucose, Bld: 130 mg/dL — ABNORMAL HIGH (ref 70–99)
Potassium: 3.5 mmol/L (ref 3.5–5.1)
Sodium: 133 mmol/L — ABNORMAL LOW (ref 135–145)

## 2018-02-26 LAB — IRON AND TIBC
Iron: 14 ug/dL — ABNORMAL LOW (ref 45–182)
Saturation Ratios: 3 % — ABNORMAL LOW (ref 17.9–39.5)
TIBC: 456 ug/dL — ABNORMAL HIGH (ref 250–450)
UIBC: 442 ug/dL

## 2018-02-26 LAB — VITAMIN B12: VITAMIN B 12: 202 pg/mL (ref 180–914)

## 2018-02-26 LAB — POC OCCULT BLOOD, ED: FECAL OCCULT BLD: POSITIVE — AB

## 2018-02-26 LAB — FERRITIN: Ferritin: 46 ng/mL (ref 24–336)

## 2018-02-26 MED ORDER — SODIUM CHLORIDE 0.9 % IV SOLN
8.0000 mg/h | INTRAVENOUS | Status: DC
  Administered 2018-02-26 – 2018-03-01 (×3): 8 mg/h via INTRAVENOUS
  Filled 2018-02-26 (×13): qty 80

## 2018-02-26 MED ORDER — ALBUTEROL SULFATE (2.5 MG/3ML) 0.083% IN NEBU: 2.5000 mg | INHALATION_SOLUTION | RESPIRATORY_TRACT | Status: DC | PRN

## 2018-02-26 MED ORDER — SODIUM CHLORIDE 0.9 % IV SOLN
80.0000 mg | Freq: Once | INTRAVENOUS | Status: AC
  Administered 2018-02-26: 80 mg via INTRAVENOUS
  Filled 2018-02-26: qty 80

## 2018-02-26 MED ORDER — POLYVINYL ALCOHOL 1.4 % OP SOLN
1.0000 [drp] | Freq: Every day | OPHTHALMIC | Status: DC
  Filled 2018-02-26: qty 15

## 2018-02-26 MED ORDER — AMIODARONE HCL 200 MG PO TABS
100.0000 mg | ORAL_TABLET | Freq: Every day | ORAL | Status: DC
  Administered 2018-02-27 – 2018-03-01 (×3): 100 mg via ORAL
  Filled 2018-02-26 (×4): qty 1

## 2018-02-26 MED ORDER — SODIUM CHLORIDE 0.9% IV SOLUTION
Freq: Once | INTRAVENOUS | Status: AC
  Administered 2018-02-26: 20:00:00 via INTRAVENOUS

## 2018-02-26 MED ORDER — SODIUM CHLORIDE 0.9% IV SOLUTION
Freq: Once | INTRAVENOUS | Status: AC
  Administered 2018-02-26: 15:00:00 via INTRAVENOUS

## 2018-02-26 MED ORDER — POTASSIUM CHLORIDE CRYS ER 20 MEQ PO TBCR
40.0000 meq | EXTENDED_RELEASE_TABLET | Freq: Once | ORAL | Status: AC
  Administered 2018-02-26: 40 meq via ORAL
  Filled 2018-02-26: qty 2

## 2018-02-26 MED ORDER — PROSIGHT PO TABS
1.0000 | ORAL_TABLET | Freq: Two times a day (BID) | ORAL | Status: DC
  Administered 2018-02-26 – 2018-03-01 (×6): 1 via ORAL
  Filled 2018-02-26 (×7): qty 1

## 2018-02-26 MED ORDER — FUROSEMIDE 10 MG/ML IJ SOLN
20.0000 mg | Freq: Once | INTRAMUSCULAR | Status: AC
  Administered 2018-02-26: 20 mg via INTRAVENOUS
  Filled 2018-02-26: qty 2

## 2018-02-26 MED ORDER — ACETAMINOPHEN 500 MG PO TABS: 1000.0000 mg | ORAL_TABLET | Freq: Four times a day (QID) | ORAL | Status: DC | PRN

## 2018-02-26 MED ORDER — TORSEMIDE 20 MG PO TABS: 20.0000 mg | ORAL_TABLET | ORAL | Status: DC

## 2018-02-26 MED ORDER — FINASTERIDE 1 MG PO TABS: 1.0000 mg | ORAL_TABLET | Freq: Every day | ORAL | Status: DC

## 2018-02-26 MED ORDER — TORSEMIDE 20 MG PO TABS
20.0000 mg | ORAL_TABLET | Freq: Two times a day (BID) | ORAL | Status: DC
  Administered 2018-02-27 – 2018-03-01 (×5): 20 mg via ORAL
  Filled 2018-02-26 (×6): qty 1

## 2018-02-26 MED ORDER — PANTOPRAZOLE SODIUM 40 MG IV SOLR: 40.0000 mg | Freq: Two times a day (BID) | INTRAVENOUS | Status: DC

## 2018-02-26 MED ORDER — ALPRAZOLAM 0.5 MG PO TABS
0.5000 mg | ORAL_TABLET | Freq: Every day | ORAL | Status: DC | PRN
  Administered 2018-02-28: 0.5 mg via ORAL
  Filled 2018-02-26: qty 1

## 2018-02-26 MED ORDER — CARVEDILOL 3.125 MG PO TABS
3.1250 mg | ORAL_TABLET | Freq: Two times a day (BID) | ORAL | Status: DC
  Administered 2018-02-27 – 2018-03-01 (×3): 3.125 mg via ORAL
  Filled 2018-02-26 (×3): qty 1

## 2018-02-26 NOTE — ED Notes (Signed)
ED Provider at bedside. 

## 2018-02-26 NOTE — Telephone Encounter (Signed)
Called patient because of critical lab : Hemoglobin 13.0 - 17.0 g/dL 6.7Low Panic     Patient advised to go to the emergency room. Pt aware and agreeable with plan. Cath for 03/03/18 cancelled per Dr.McLean.

## 2018-02-26 NOTE — Progress Notes (Signed)
Advanced Heart Failure Clinic Note   PCP: [name], MD PCP-Cardiologist: [name], MD  HF: [name]   HPI:  Travis [name] is a 77 y.o. 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   Pt failed DCCV 08/14/17, 08/31/17, and 09/17/17. Failed Tikosyn. Did have conversion with Amiodarone and DCCV. Pt underwent successful ablation 10/07/17.  However, he had ERAF.  Admitted 5/18 - 10/14/17 with atrial fibrillation/RVR and acute on chronic systolic CHF.  Echo showed EF 30-35%. Diuresed and reloaded with Amio. Underwent successful DCCV 10/13/17.    Echo in 9/19 showed improvement in EF to 50% in NSR.   At last appointment, patient reported profound fatigue.  He was volume overloaded on exam and diuretic was increased.  CBC was done, hgb was low and patient ended up going to the ER for admission.  He was diagnosed with UGIB likely from duodenal ulcer, probably due to meloxicam.  This was stopped.   He presents today for followup of CHF and atrial fibrillation.  He remains profoundly fatigued, thinks even worse than prior to last appointment.  He has muscle aches all over.  No energy, cannot walk a block.  No chest pain.  Occasionally feels palpitations like he is in atrial fibrillation, but feels like most of the time he is in NSR.  He feels worse when he feels the palpitations.  Today, he is in NSR.  He does not really get short of breath, just fatigued.   After patient left, the CBC we sent today came back with hgb 6.7. We called patient and told him to go to the ER for workup.  Hold Xarelto.   Labs (3/19): LDL 65 Labs (8/19): K 5.2 => 4.1, creatinine 1.6 Labs (9/19): K 3.9, creatinine 1.78, hgb 9.6, TSH normal.  Labs (10/19): hgb 6.7, LFTs normal.   ECG (personally reviewed): NSR,  LBBB 150 msec  Review of systems complete and found to be negative unless listed in HPI.    Past Medical History 1. Chronic systolic CHF: Possible tachy-mediated cardiomyopathy.    - Echo (08/13/2017): LVEF 30-35%, moderate AS, mild AI, severe LAE, RV mild dilated with systolic function mild/mod reduced, PA peak pressure 46 mm Hg. - Echo (9/19): EF 50%, mild diffuse hypokinesis, normal RV size and systolic function, PASP 70 mmHg, moderate AS, moderate AI, moderate MR.  2. Atrial fibrillation: Has been difficult to control.  Failed Tikosyn and multiple DCCVs.  Failed amiodarone prior to ablation.  Atrial fibrillation ablation but then had ERAF.  He was cardioverted again in 5/19 with return to NSR.    3. Bicuspid aortic valve disorder: Moderate AS and moderate AI on last echo.   4. HTN 5. Hyperlipidemia 6. Mitral regurgitation: Moderate on last echo.  7. CAD: coronary calcium noted on chest CT.  8. CKD: Stage 3.  9. Carotid stenosis: Carotid dopplers (3/97) with 67-34% LICA stenosis.  10. Chronic LBBB 11. PUD: UGIB in 9/19 with duodenal ulcer probably due to meloxicam use.   Current Outpatient Medications  Medication Sig Dispense Refill  . acetaminophen (TYLENOL) 500 MG tablet Take 1,000 mg by mouth every 6 (six) hours as needed for mild pain or headache.     . ALPRAZolam (XANAX) 0.5 MG tablet TAKE 1 TABLET BY MOUTH TWICE DAILY AS NEEDED FOR ANXIETY( SEPARATE BY  8 HOURS FROM TEMAZEPAM) 5 tablet 0  . amiodarone (PACERONE) 200 MG tablet Take 0.5 tablets (100 mg total) by mouth daily. 90 tablet 2  . amoxicillin (AMOXIL) 500 MG tablet Take 2,000 mg by mouth once as needed. Dental procedure  4  . carvedilol (COREG) 3.125 MG tablet Take 1 tablet (3.125 mg total) by mouth 2 (two) times daily with a meal. 180 tablet 3  . finasteride (PROPECIA) 1 MG tablet TAKE 1 TABLET(1 MG) BY MOUTH DAILY 30 tablet 5  . hydroxypropyl methylcellulose / hypromellose (ISOPTO TEARS / GONIOVISC) 2.5 % ophthalmic  solution Place 2 drops into both eyes 3 (three) times daily as needed for dry eyes.    . pantoprazole (PROTONIX) 40 MG tablet Take 1 tablet (40 mg total) by mouth 2 (two) times daily. 60 tablet 2  . Rivaroxaban (XARELTO) 15 MG TABS tablet Take 1 tablet (15 mg total) by mouth daily with supper. 60 tablet 2  . temazepam (RESTORIL) 15 MG capsule TAKE ONE CAPSULE BY MOUTH AT BEDTIME AS NEEDED 30 capsule 5  . torsemide (DEMADEX) 20 MG tablet Take 2 tabs in AM and 1 tab in PM 90 tablet 2   No current facility-administered medications for this encounter.    No Known Allergies  Social History   Socioeconomic History  . Marital status: Single    Spouse name: Not on file  . Number of children: 0  . Years of education: Not on file  . Highest education level: Not on file  Occupational History  . Occupation: Retired  Scientific laboratory technician  . Financial resource strain: Not on file  . Food insecurity:    Worry: Not on file    Inability: Not on file  . Transportation needs:    Medical: Not on file    Non-medical: Not on file  Tobacco Use  . Smoking status: Former Smoker    Packs/day: 1.50    Years: 25.00    Pack years: 37.50    Last attempt to quit: 05/26/1986    Years since quitting: 31.7  . Smokeless tobacco: Never Used  Substance and Sexual Activity  . Alcohol use: Yes    Alcohol/week: 1.0 standard drinks    Types: 1 Standard drinks or equivalent per week    Comment: stopped drinking   . Drug use: No  . Sexual activity: Not Currently  Lifestyle  . Physical activity:    Days per week: Not on file    Minutes per session: Not on file  . Stress: Not on file  Relationships  . Social connections:    Talks on phone: Not on file    Gets together: Not on file    Attends religious service: Not on file    Active member of club or organization: Not on file    Attends meetings of clubs or organizations: Not on file    Relationship status: Not on file  . Intimate partner violence:    Fear of  current or ex partner: Not on file    Emotionally abused: Not on file    Physically abused: Not on file    Forced sexual activity: Not on file  Other Topics Concern  . Not on file  Social History Narrative   Homosexual. Lives alone. Not sexually active.       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 Stroke Father 57       smoker  . Lung cancer Mother 12       former smoker  . Stroke Mother   . Stroke Brother   . Colon cancer Neg Hx    Vitals:   02/26/18 0855  BP: (!) 106/52  Pulse: 65  SpO2: 100%  Weight: 76.9 kg (169 lb 8 oz)     Wt Readings from Last 3 Encounters:  02/26/18 74.8 kg (165 lb)  02/26/18 76.9 kg (169 lb 8 oz)  02/10/18 75.5 kg (166 lb 6.4 oz)    PHYSICAL EXAM: General: NAD Neck: No JVD, no thyromegaly or thyroid nodule.  Lungs: Clear to auscultation bilaterally with normal respiratory effort. CV: Nondisplaced PMI.  Heart regular S1/S2, no J8/A4, 3/6 systolic crescendo-decrescendo murmur RUSB with clear S2.  No peripheral edema.  No carotid bruit.  Normal pedal pulses.  Abdomen: Soft, nontender, no hepatosplenomegaly, no distention.  Skin: Intact without lesions or rashes.  Neurologic: Alert and oriented x 3.  Psych: Normal affect. Extremities: No clubbing or cyanosis.  HEENT: Normal.   ASSESSMENT & PLAN: 1. Chronic systolic CHF: Echo 1/66 with EF 30-35%, mild to moderately decreased RV systolic function in setting of atrial fibrillation with RVR.  He is now in NSR, and echo in 9/19 showed EF up to 50%, mild diffuse hypokinesis.  I suspect he has had a tachy-mediated cardiomyopathy.  Today, on higher dose of torsemide, he does not look volume overloaded.  Weight is down 9 lbs.   - Continue torsemide 40 qam/20 qpm.  - With rise in EF and elevated creatinine, will have him stop losartan for now.  - Continue Coreg  3.125 mg bid.   - Off spironolactone with elevated K.  Can leave off for now with EF back up to 50%.  - Need to either keep in NSR or eventually AVN ablate/pace (see below). - With ongoing fatigue with exertion, we discussed RHC/LHC today.  However, I suspect hgb 6.7 explains his symptoms.  Suspect recurrent GI bleed => sending to ER.  2. Atrial fibrillation: Persistent, difficult to control rate and rhythm. He failed Tikosyn. He started on amiodarone, then had atrial fibrillation ablation in 5/19 with ERAF, requiring DCCV.  He remains in NSR today with occasional palpitations.  - Continue amiodarone 100 mg daily.  LFTs/TSH normal recently, he will need regular eye exam.  - Continue Xarelto.  - If he fails to maintain NSR despite amiodarone and DCCV, may need AV nodal ablation/BiV pacing.  3. CAD: Extensive coronary calcification on recent CT chest. No chest pain. I suspect that his cardiomyopathy is tachycardia-mediated given rise in EF in NSR, but given profound exertional fatigue had planned RHC/LHC.  However, with hgb down to 6.7, this likely explains worsening symptoms so will hold off on cath for now.  - No ASA with Xarelto use.  - With muscle pain, will stop atorvastatin for now to see if this helps.   4. CKD: Stage 3 - BMET today.  5. Bicuspid aortic valve stenosis: Moderate AS/moderate AI on last echo.  6. Mitral regurgitation: Moderate on echo in 9/19.   7. LBBB: Chronic.  8. Anemia: Suspect due to upper GI bleeding.  Hgb down to 6.7 today.  Recent admission with duodenal ulcer.  Suspect profound anemia explains his marked fatigue.  - Will need admission for transfusion and GI workup.  - Hold Xarelto for now.   Followup in 3-4 weeks with me  [name], MD 02/26/18

## 2018-02-26 NOTE — Patient Instructions (Addendum)
Today you have been seen at the Heart failure clinic at [facility]   Medication changes: STOP Atrovastatin, STOP Losartan,  Hold xarelto 2 nights before the cath  Lab work done today: BEMT, CBC, INR  Follow up with [name] in 3-4 weeks   You have the following test scheduled for: Left and Right Heart Cath (Hold xarelto 2 nights before the cath) be at the hospital at 6:30 am in short stay procedure is scheduled for 8:30am, you will need someone here with you because you can not drive after your cath,   Your physician has recommended that you have a heart catheterization. After this is performed, and if a blockage is found, an angioplasty or stenting procedure may be necessary. Please see handout/brochure given to you today for more information about these procedures.     [facility] [place] [id number] [place] [address] Dept: [phone] Loc: [phone]  Travis [name]  02/26/2018  You are scheduled for a Right and Left heart cath on March 03, 2018 with [name].  1. Please arrive at the [facility] (Main Entrance A) at [facility]: [address] [place], [place] [address] at 6:30 (This time is two hours before your procedure to ensure your preparation). Free valet parking service is available.   Special note: Every effort is made to have your procedure done on time. Please understand that emergencies sometimes delay scheduled procedures.  2. Diet: Do not eat solid foods after midnight.  The patient may have clear liquids until 5am upon the day of the procedure.  3. Labs: You will need to have blood drawn on today BMET, CBC, INR   4. Medication instructions in preparation for your procedure:  Hold xarelto 2 nights before the cath   On the morning of your procedure, take 81 mg Asprin and any morning medicines NOT listed above.  You may use sips of water.  5. Plan  for one night stay--bring personal belongings. 6. Bring a current list of your medications and current insurance cards. 7. You MUST have a responsible person to drive you home. 8. Someone MUST be with you the first 24 hours after you arrive home or your discharge will be delayed. 9. Please wear clothes that are easy to get on and off and wear slip-on shoes.  Thank you for allowing [place] to care for you!   -- [facility]

## 2018-02-26 NOTE — H&P (Signed)
TRH H&P   Patient Demographics:    Travis [name], is a 77 y.o. male  MRN: [id number]   DOB - April 09, 1941  Admit Date - 02/26/2018  Outpatient Primary MD for the patient is [name], MD  Referring MD/NP/PA: [place]  Outpatient Specialists: Dr Aundra Dubin  Patient coming from: Home  Chief Complaint  Patient presents with  . Abnormal Lab      HPI:    Travis [name]  is a 77 y.o. male, with medical history significant foratrial fibrillation on Xarelto, chronic systolic CHF, hypertension, chronic kidney disease stage III, and anxiety, with recent hospitalization secondary to symptomatic anemia, anoscopy significant for nonbleeding gastric ulcer, feels secondary to NSAIDs, which was held on discharge, he was resumed on Xarelto, patient has been complaining with some fatigue, and had blood work-up done in anticipation of cardiac cath needs soon, his hemoglobin was noted to be 6.7, and creatinine elevated to 1.6, so he was sent for further evaluation. -NAD hemoglobin was 6.7, he was Hemoccult positive stool, he denies any coffee-ground emesis, or bright red blood per rectum or any change in his stool color, has any chest pain, shortness of breath, reports generalized weakness and fatigue at baseline being transfused 1 unit PRBC, there was called to admit    Review of systems:    In addition to the HPI above,  No Fever-chills, does report generalized weakness and fatigue No Headache, No changes with Vision or hearing, No problems swallowing food or Liquids, No Chest pain, Cough or Shortness of Breath, No Abdominal pain, No Nausea or Vommitting, Bowel movements are regular, No Blood in stool or Urine, No dysuria, No new skin rashes or bruises, No new joints pains-aches,  No new weakness, tingling, numbness in any extremity, No recent weight gain or loss, No polyuria, polydypsia or  polyphagia, No significant Mental Stressors.  A full 10 point Review of Systems was done, except as stated above, all other Review of Systems were negative.   With Past History of the following :    Past Medical History:  Diagnosis Date  . Arthritis   . Biatrial enlargement    severe  . Bicuspid aortic valve   . CHF (congestive heart failure) ([place])    JULY 2014  . Chronic renal insufficiency   . GERD (gastroesophageal reflux disease)   . History of cardioversion 12/16/2012  . History of stomach ulcers   . Hypertension   . Left bundle branch block   . Nonischemic cardiomyopathy ([place])       . Persistent atrial fibrillation    multiple prior cardioversion  . Sinus bradycardia   . Status post clamping of cerebral aneurysm 79      Past Surgical History:  Procedure Laterality Date  . ATRIAL FIBRILLATION ABLATION N/A 10/06/2017   Procedure: ATRIAL FIBRILLATION ABLATION;  Surgeon: [name], MD;  Location: [place] [facility];  Service: Cardiovascular;  Laterality: N/A;  . BIOPSY  02/06/2018   Procedure: BIOPSY;  Surgeon: Lavena Bullion, DO;  Location: [place];  Service: Gastroenterology;;  . CARDIAC CATHETERIZATION    . CARDIOVERSION N/A 12/16/2012   Procedure: CARDIOVERSION;  Surgeon: Lelon Perla, MD;  Location: [facility] ENDOSCOPY;  Service: Cardiovascular;  Laterality: N/A;  . CARDIOVERSION N/A 08/14/2017   Procedure: CARDIOVERSION;  Surgeon: Josue Hector, MD;  Location: [facility] ENDOSCOPY;  Service: Cardiovascular;  Laterality: N/A;  . CARDIOVERSION N/A 09/21/2017   Procedure: CARDIOVERSION;  Surgeon: Sanda Klein, MD;  Location: [facility] ENDOSCOPY;  Service: Cardiovascular;  Laterality: N/A;  . CARDIOVERSION N/A 10/13/2017   Procedure: CARDIOVERSION;  Surgeon: Josue Hector, MD;  Location: [place];  Service: Cardiovascular;  Laterality: N/A;  . CATARACT EXTRACTION  05/2015   bilateral  . cerebral anuersym post clips    . ESOPHAGOGASTRODUODENOSCOPY N/A 02/06/2018    Procedure: ESOPHAGOGASTRODUODENOSCOPY (EGD);  Surgeon: Lavena Bullion, DO;  Location: [facility] ENDOSCOPY;  Service: Gastroenterology;  Laterality: N/A;  . fractured left arm    . HERNIA REPAIR     lft  . INGUINAL HERNIA REPAIR  07/02/2011   Procedure: LAPAROSCOPIC INGUINAL HERNIA;  Surgeon: Harl Bowie, MD;  Location: [place];  Service: General;  Laterality: Left;  Laparoscopic left inguinal hernia repair and mesh  . left tendon repair     lft foot  . ROTATOR CUFF REPAIR     lf  . TEE WITHOUT CARDIOVERSION N/A 08/14/2017   Procedure: TRANSESOPHAGEAL ECHOCARDIOGRAM (TEE);  Surgeon: Josue Hector, MD;  Location: [facility] ENDOSCOPY;  Service: Cardiovascular;  Laterality: N/A;  . TONSILLECTOMY    . TOTAL KNEE ARTHROPLASTY Bilateral 02/06/2014   Procedure: TOTAL KNEE BILATERAL;  Surgeon: Mauri Pole, MD;  Location: [facility];  Service: Orthopedics;  Laterality: Bilateral;  . WRIST GANGLION EXCISION     lft      Social History:     Social History   Tobacco Use  . Smoking status: Former Smoker    Packs/day: 1.50    Years: 25.00    Pack years: 37.50    Last attempt to quit: 05/26/1986    Years since quitting: 31.7  . Smokeless tobacco: Never Used  Substance Use Topics  . Alcohol use: Yes    Alcohol/week: 1.0 standard drinks    Types: 1 Standard drinks or equivalent per week    Comment: stopped drinking      Lives -at home  Mobility -independent     Family History :     Family History  Problem Relation Age of Onset  . Stroke Father 39       smoker  . Lung cancer Mother 45       former smoker  . Stroke Mother   . Stroke Brother   . Colon cancer Neg Hx       Home Medications:   Prior to Admission medications   Medication Sig Start Date End Date Taking? Authorizing Provider  acetaminophen (TYLENOL) 500 MG tablet Take 1,000 mg by mouth every 6 (six) hours as needed for mild pain or headache.    Yes [provider]  ALPRAZolam (XANAX) 0.5 MG tablet TAKE 1 TABLET  BY MOUTH TWICE DAILY AS NEEDED FOR ANXIETY( SEPARATE BY 8 HOURS FROM TEMAZEPAM) Patient taking differently: Take 0.5 mg by mouth daily as needed for anxiety.  09/25/17  Yes [name], MD  amiodarone (PACERONE) 200 MG tablet Take 0.5 tablets (100 mg total) by  mouth daily. 01/26/18  Yes Sherran Needs, NP  amoxicillin (AMOXIL) 500 MG tablet Take 2,000 mg by mouth See admin instructions. Take four capsules (2000 mg) by mouth one hour prior to dental procedure 02/04/18  Yes [provider]  Carboxymethylcell-Hypromellose (GENTEAL) 0.25-0.3 % GEL Place 1 drop into both eyes at bedtime.   Yes [provider]  carvedilol (COREG) 3.125 MG tablet Take 1 tablet (3.125 mg total) by mouth 2 (two) times daily with a meal. 02/04/18  Yes Larey Dresser, MD  finasteride (PROPECIA) 1 MG tablet TAKE 1 TABLET(1 MG) BY MOUTH DAILY Patient taking differently: Take 1 mg by mouth daily.  10/05/17  Yes [name], MD  Multiple Vitamins-Minerals (PRESERVISION AREDS 2) CAPS Take 1 capsule by mouth 2 (two) times daily.   Yes [provider]  OVER THE COUNTER MEDICATION Apply 1 application topically daily as needed (pain). CBD cream   Yes [provider]  pantoprazole (PROTONIX) 40 MG tablet Take 1 tablet (40 mg total) by mouth 2 (two) times daily. 02/06/18  Yes Hosie Poisson, MD  Rivaroxaban (XARELTO) 15 MG TABS tablet Take 1 tablet (15 mg total) by mouth daily with supper. Patient taking differently: Take 15 mg by mouth daily after supper.  09/21/17  Yes Kathyrn Drown D, NP  temazepam (RESTORIL) 15 MG capsule TAKE ONE CAPSULE BY MOUTH AT BEDTIME AS NEEDED Patient taking differently: Take 15 mg by mouth at bedtime.  11/10/17  Yes [name], MD  torsemide (DEMADEX) 20 MG tablet Take 2 tabs in AM and 1 tab in PM Patient taking differently: Take 20-40 mg by mouth See admin instructions. Take 2 tablets (40 mg) by mouth every morning and 1 tablet (20 mg) at night 02/04/18  Yes  Larey Dresser, MD     Allergies:    No Known Allergies   Physical Exam:   Vitals  Blood pressure (!) 139/58, pulse 71, temperature 98.1 F (36.7 C), temperature source Oral, resp. rate 18, height 5\' 8"  (1.727 m), weight 74.8 kg, SpO2 95 %.   1. General well-developed male, laying in bed in no apparent distress  2. Normal affect and insight, Not Suicidal or Homicidal, Awake Alert, Oriented X 3.  3. No F.N deficits, ALL C.Nerves Intact, Strength 5/5 all 4 extremities, Sensation intact all 4 extremities, Plantars down going.  4. Ears and Eyes appear Normal, Conjunctivae clear, PERRLA. Moist Oral Mucosa.  5. Supple Neck, No JVD, No cervical lymphadenopathy appriciated, No Carotid Bruits.  6. Symmetrical Chest wall movement, Good air movement bilaterally, CTAB.  7. RRR, No Gallops, Rubs or Murmurs, No Parasternal Heave.  8. Positive Bowel Sounds, Abdomen Soft, No tenderness, No organomegaly appriciated,No rebound -guarding or rigidity.  9.  No Cyanosis, Normal Skin Turgor, No Skin Rash or Bruise.  10. Good muscle tone,  joints appear normal , no effusions, Normal ROM.  11. No Palpable Lymph Nodes in Neck or Axillae    Data Review:    CBC Recent Labs  Lab 02/26/18 0935 02/26/18 1315  WBC 6.6 6.0  HGB 6.7* 6.8*  HCT 22.2* 22.2*  PLT 286 295  MCV 87.7 87.1  MCH 26.5 26.7  MCHC 30.2 30.6  RDW 16.3* 16.5*   ------------------------------------------------------------------------------------------------------------------  Chemistries  Recent Labs  Lab 02/26/18 0935 02/26/18 1315  NA 133* 131*  K 3.5 3.4*  CL 95* 94*  CO2 28 27  GLUCOSE 130* 136*  BUN 20 24*  CREATININE 1.35* 1.55*  CALCIUM 8.7* 8.5*  AST  --  23  ALT  --  19  ALKPHOS  --  75  BILITOT  --  0.7   ------------------------------------------------------------------------------------------------------------------ estimated creatinine clearance is 38.6 mL/min (A) (by C-G formula based on  SCr of 1.55 mg/dL (H)). ------------------------------------------------------------------------------------------------------------------ No results for input(s): TSH, T4TOTAL, T3FREE, THYROIDAB in the last 72 hours.  Invalid input(s): FREET3  Coagulation profile Recent Labs  Lab 02/26/18 0935  INR 1.99   ------------------------------------------------------------------------------------------------------------------- No results for input(s): DDIMER in the last 72 hours. -------------------------------------------------------------------------------------------------------------------  Cardiac Enzymes No results for input(s): CKMB, TROPONINI, MYOGLOBIN in the last 168 hours.  Invalid input(s): CK ------------------------------------------------------------------------------------------------------------------    Component Value Date/Time   BNP 3,394.4 (H) 10/10/2017 0912     ---------------------------------------------------------------------------------------------------------------  Urinalysis    Component Value Date/Time   COLORURINE YELLOW 02/05/2018 2332   APPEARANCEUR CLEAR 02/05/2018 2332   LABSPEC 1.011 02/05/2018 2332   PHURINE 7.0 02/05/2018 2332   GLUCOSEU NEGATIVE 02/05/2018 2332   HGBUR NEGATIVE 02/05/2018 2332   HGBUR negative 04/05/2009 0802   BILIRUBINUR NEGATIVE 02/05/2018 2332   BILIRUBINUR n 02/03/2011 1315   KETONESUR NEGATIVE 02/05/2018 2332   PROTEINUR NEGATIVE 02/05/2018 2332   UROBILINOGEN 1.0 01/27/2014 1002   NITRITE NEGATIVE 02/05/2018 2332   LEUKOCYTESUR NEGATIVE 02/05/2018 2332    ----------------------------------------------------------------------------------------------------------------   Imaging Results:    No results found.  My personal review of EKG: Rhythm NSR, Rate  52 /min, QTc 459 , no Acute ST changes   Assessment & Plan:    Active Problems:   Hypertension   Hyperlipidemia   Symptomatic anemia   Persistent  atrial fibrillation   Chronic systolic heart failure (HCC)   GI bleed   Symptomatic anemia -Patient with recent hospitalization secondary to symptomatic anemia as well, endoscopy then significant for gastric ulcer with no stigmata of bleeding, thought secondary to NSAID, he resumed his Xarelto on discharge. -Continue to hold Xarelto -Hemoglobin is 6.7 today, down from 9.6, follow on anemia panel, recent work-up significant for iron deficiency anemia, likely will need IV iron.. -He will receive 1 unit PRBC, repeat CBC overnight.  GI bleed -Recent endoscopy significant for gastric ulcer, for now continue with the Protonix drip, discussed with GI, keep on liquid diet today, keep n.p.o. after night if procedure felt to be needed by GI.  Chronic systolic CHF -Management per cardiology, recommendation to continue torsemide, I will hold tonight, and resume tomorrow at a lower dose in the setting of worsening renal function, and you with beta-blockers per the recommendation  Persistent A. Fib -Continue with amiodarone and beta-blockers, anticoagulation being held secondary to GI bleed  Hyperlipidemia -Statin been held by cardiology and given muscle pain  AKI and CKD stage III -Discontinue losartan, hold torsemide for tonight, and resume in a.m. on a lower dose, PRBC transfusion overnight will give some volume and should help as well  Hypertension -Pressure acceptable    DVT Prophylaxis SCDs   AM Labs Ordered, also please review Full Orders  Family Communication: Admission, patients condition and plan of care including tests being ordered have been discussed with the patient  who indicate understanding and agree with the plan and Code Status.  Code Status Full  Likely DC to  home  Condition GUARDED    Consults called: GI, labeur, to see in am, cardiology  Admission status: observation  Time spent in minutes : 60 minutes   Phillips Climes M.D on 02/26/2018 at 6:05  PM  Between 7am to 7pm - Pager - [phone]. After  7pm go to [email or web address] - password [facility]  [facility] - Office  [phone]

## 2018-02-26 NOTE — ED Provider Notes (Addendum)
[facility] EMERGENCY DEPARTMENT Provider Note   CSN: [id number] Arrival date & time: 02/26/18  1216     History   Chief Complaint Chief Complaint  Patient presents with  . Abnormal Lab    HPI Travis [name] is a 77 y.o. male w PMHx CHF, persistent afib on xarelto, GERD, chronic renal insufficiency, presenting to the ED with abnormal lab result.  Patient was recently admitted last month with acute symptomatic anemia.  Per chart review, he was admitted on 02/05/18 with hemoglobin of 7.8. EGD was done during his admission that showed a nonbleeding duodenal ulcer with some duodenal stenosis.  He was cleared to restart Xarelto and discharged with Protonix.  He also reports he was taking meloxicam chronically and this was thought to be the cause of his anemia.  He was seen by his cardiologist today in preparation for cardiac catheterization when he was noted to be anemic once again with a hemoglobin of 6.8.  He states he is been feeling rundown and generally fatigued though without significant abdominal complaints.  He states he has a left inguinal hernia that causes him discomfort though it is easily reproducible and is not new.  He has been compliant with Xarelto, and not been taking any NSAIDs.  Denies melena, bright red blood in stool, hematuria, hematemesis.  No bleeding wounds.   The history is provided by the patient and medical records.    Past Medical History:  Diagnosis Date  . Arthritis   . Biatrial enlargement    severe  . Bicuspid aortic valve   . CHF (congestive heart failure) ([place])    JULY 2014  . Chronic renal insufficiency   . GERD (gastroesophageal reflux disease)   . History of cardioversion 12/16/2012  . History of stomach ulcers   . Hypertension   . Left bundle branch block   . Nonischemic cardiomyopathy ([place])       . Persistent atrial fibrillation    multiple prior cardioversion  . Sinus bradycardia   . Status post clamping of cerebral  aneurysm 79    Patient Active Problem List   Diagnosis Date Noted  . GI bleed 02/06/2018  . Chronic systolic heart failure ([place]) 10/10/2017  . Persistent atrial fibrillation 10/06/2017  . Symptomatic anemia 08/01/2015  . Hyperlipidemia 12/19/2014  . Male pattern baldness 03/06/2014  . Anxiety state 03/06/2014  . Insomnia 03/06/2014  . Overweight (BMI 25.0-29.9) 02/08/2014  . S/P TKR (total knee replacement) 02/08/2014  . Sinus bradycardia 01/04/2013  . Pulmonary hypertension ([place]) 03/09/2012  . Hyponatremia 02/21/2012  . Bicuspid aortic valve   . Hypertension   . Cardiomyopathy, rate related with resolution now recurrent 12/08/2011  . Left bundle branch block 12/10/2010  . DRY MOUTH 11/07/2009  . Carotid bruit 07/05/2009  . GERD 01/07/2008  . Osteoarthritis 02/04/2007  . GANGLION CYST, WRIST, LEFT 02/04/2007    Past Surgical History:  Procedure Laterality Date  . ATRIAL FIBRILLATION ABLATION N/A 10/06/2017   Procedure: ATRIAL FIBRILLATION ABLATION;  Surgeon: [name], MD;  Location: [facility];  Service: Cardiovascular;  Laterality: N/A;  . BIOPSY  02/06/2018   Procedure: BIOPSY;  Surgeon: Lavena Bullion, DO;  Location: [facility];  Service: Gastroenterology;;  . CARDIAC CATHETERIZATION    . CARDIOVERSION N/A 12/16/2012   Procedure: CARDIOVERSION;  Surgeon: Lelon Perla, MD;  Location: [facility] ENDOSCOPY;  Service: Cardiovascular;  Laterality: N/A;  . CARDIOVERSION N/A 08/14/2017   Procedure: CARDIOVERSION;  Surgeon: Josue Hector, MD;  Location: [facility];  Service: Cardiovascular;  Laterality: N/A;  . CARDIOVERSION N/A 09/21/2017   Procedure: CARDIOVERSION;  Surgeon: Sanda Klein, MD;  Location: [facility] ENDOSCOPY;  Service: Cardiovascular;  Laterality: N/A;  . CARDIOVERSION N/A 10/13/2017   Procedure: CARDIOVERSION;  Surgeon: Josue Hector, MD;  Location: [facility] ENDOSCOPY;  Service: Cardiovascular;  Laterality: N/A;  . CATARACT EXTRACTION  05/2015   bilateral   . cerebral anuersym post clips    . ESOPHAGOGASTRODUODENOSCOPY N/A 02/06/2018   Procedure: ESOPHAGOGASTRODUODENOSCOPY (EGD);  Surgeon: Lavena Bullion, DO;  Location: [facility] ENDOSCOPY;  Service: Gastroenterology;  Laterality: N/A;  . fractured left arm    . HERNIA REPAIR     lft  . INGUINAL HERNIA REPAIR  07/02/2011   Procedure: LAPAROSCOPIC INGUINAL HERNIA;  Surgeon: Harl Bowie, MD;  Location: [facility];  Service: General;  Laterality: Left;  Laparoscopic left inguinal hernia repair and mesh  . left tendon repair     lft foot  . ROTATOR CUFF REPAIR     lf  . TEE WITHOUT CARDIOVERSION N/A 08/14/2017   Procedure: TRANSESOPHAGEAL ECHOCARDIOGRAM (TEE);  Surgeon: Josue Hector, MD;  Location: [facility] ENDOSCOPY;  Service: Cardiovascular;  Laterality: N/A;  . TONSILLECTOMY    . TOTAL KNEE ARTHROPLASTY Bilateral 02/06/2014   Procedure: TOTAL KNEE BILATERAL;  Surgeon: Mauri Pole, MD;  Location: [facility];  Service: Orthopedics;  Laterality: Bilateral;  . WRIST GANGLION EXCISION     lft        Home Medications    Prior to Admission medications   Medication Sig Start Date End Date Taking? Authorizing Provider  acetaminophen (TYLENOL) 500 MG tablet Take 1,000 mg by mouth every 6 (six) hours as needed for mild pain or headache.     [provider]  ALPRAZolam Duanne Moron) 0.5 MG tablet TAKE 1 TABLET BY MOUTH TWICE DAILY AS NEEDED FOR ANXIETY( SEPARATE BY 8 HOURS FROM TEMAZEPAM) 09/25/17   [name], MD  amiodarone (PACERONE) 200 MG tablet Take 0.5 tablets (100 mg total) by mouth daily. 01/26/18   Sherran Needs, NP  amoxicillin (AMOXIL) 500 MG tablet Take 2,000 mg by mouth once as needed. Dental procedure 02/04/18   [provider]  carvedilol (COREG) 3.125 MG tablet Take 1 tablet (3.125 mg total) by mouth 2 (two) times daily with a meal. 02/04/18   Larey Dresser, MD  finasteride (PROPECIA) 1 MG tablet TAKE 1 TABLET(1 MG) BY MOUTH DAILY 10/05/17   [name], MD    hydroxypropyl methylcellulose / hypromellose (ISOPTO TEARS / GONIOVISC) 2.5 % ophthalmic solution Place 2 drops into both eyes 3 (three) times daily as needed for dry eyes.    [provider]  pantoprazole (PROTONIX) 40 MG tablet Take 1 tablet (40 mg total) by mouth 2 (two) times daily. 02/06/18   Hosie Poisson, MD  Rivaroxaban (XARELTO) 15 MG TABS tablet Take 1 tablet (15 mg total) by mouth daily with supper. 09/21/17   Kathyrn Drown D, NP  temazepam (RESTORIL) 15 MG capsule TAKE ONE CAPSULE BY MOUTH AT BEDTIME AS NEEDED 11/10/17   [name], MD  torsemide [facility]) 20 MG tablet Take 2 tabs in AM and 1 tab in PM 02/04/18   Larey Dresser, MD    Family History Family History  Problem Relation Age of Onset  . Stroke Father 41       smoker  . Lung cancer Mother 78       former smoker  . Stroke Mother   .  Stroke Brother   . Colon cancer Neg Hx     Social History Social History   Tobacco Use  . Smoking status: Former Smoker    Packs/day: 1.50    Years: 25.00    Pack years: 37.50    Last attempt to quit: 05/26/1986    Years since quitting: 31.7  . Smokeless tobacco: Never Used  Substance Use Topics  . Alcohol use: Yes    Alcohol/week: 1.0 standard drinks    Types: 1 Standard drinks or equivalent per week    Comment: stopped drinking   . Drug use: No     Allergies   Patient has no known allergies.   Review of Systems Review of Systems  Constitutional: Positive for fatigue. Negative for fever.  Gastrointestinal: Negative for abdominal pain, blood in stool, nausea and vomiting.  Genitourinary: Negative for hematuria.  Skin: Negative for wound.  Hematological: Bruises/bleeds easily.  All other systems reviewed and are negative.    Physical Exam Updated Vital Signs BP 115/62   Pulse (!) 58   Temp 98.1 F (36.7 C) (Oral)   Resp 19   Ht 5\' 8"  (1.727 m)   Wt 74.8 kg   SpO2 100%   BMI 25.09 kg/m   Physical Exam  Constitutional: He is oriented to  person, place, and time. He appears well-developed and well-nourished. No distress.  HENT:  Head: Normocephalic and atraumatic.  Eyes: Conjunctivae are normal.  Cardiovascular: Normal heart sounds.  Borderline bradycardic  Pulmonary/Chest: Effort normal and breath sounds normal. No respiratory distress.  Abdominal: Soft. Bowel sounds are normal. He exhibits no distension. There is no rebound and no guarding.  Reducible left inguinal hernia.  Slight tenderness.  Genitourinary:  Genitourinary Comments: Exam performed with male nurse tech chaperone present. No gross blood per rectum. No obvious hemorrhoids  Neurological: He is alert and oriented to person, place, and time.  Skin: Skin is warm.  Psychiatric: He has a normal mood and affect. His behavior is normal.  Nursing note and vitals reviewed.    ED Treatments / Results  Labs (all labs ordered are listed, but only abnormal results are displayed) Labs Reviewed  COMPREHENSIVE METABOLIC PANEL - Abnormal; Notable for the following components:      Result Value   Sodium 131 (*)    Potassium 3.4 (*)    Chloride 94 (*)    Glucose, Bld 136 (*)    BUN 24 (*)    Creatinine, Ser 1.55 (*)    Calcium 8.5 (*)    GFR calc non Af Amer 41 (*)    GFR calc Af Amer 48 (*)    All other components within normal limits  CBC - Abnormal; Notable for the following components:   RBC 2.55 (*)    Hemoglobin 6.8 (*)    HCT 22.2 (*)    RDW 16.5 (*)    All other components within normal limits  VITAMIN B12  FOLATE  IRON AND TIBC  FERRITIN  RETICULOCYTES  POC OCCULT BLOOD, ED  TYPE AND SCREEN  PREPARE RBC (CROSSMATCH)    EKG None  Radiology No results found.  Procedures Procedures (including critical care time) CRITICAL CARE Performed by: [place] N Robinson   Total critical care time: 35 minutes  Critical care time was exclusive of separately billable procedures and treating other patients.  Critical care was necessary to treat or  prevent imminent or life-threatening deterioration.  Critical care was time spent personally by me on the following activities:  development of treatment plan with patient and/or surrogate as well as nursing, discussions with consultants, evaluation of patient's response to treatment, examination of patient, obtaining history from patient or surrogate, ordering and performing treatments and interventions, ordering and review of laboratory studies, ordering and review of radiographic studies, pulse oximetry and re-evaluation of patient's condition.   Medications Ordered in ED Medications  0.9 %  sodium chloride infusion (Manually program via Guardrails IV Fluids) ( Intravenous New Bag/Given 02/26/18 1528)     Initial Impression / Assessment and Plan / ED Course  I have reviewed the triage vital signs and the nursing notes.  Pertinent labs & imaging results that were available during my care of the patient were reviewed by me and considered in my medical decision making (see chart for details).  Clinical Course as of Feb 27 1608  Fri Feb 26, 2018  1426 Dr. Dalene Carrow GI   [JR]    Clinical Course User Index [JR] Robinson, [place] N, PA-C    Patient presenting to the ED with recurrent symptomatic anemia with a hemoglobin of 6.8.  Patient on Xarelto for persistent atrial fibrillation.  Recent admission last month for symptomatic anemia though upper endoscopy without evidence of active bleed.  Today he reports generalized fatigue though without particular abdominal complaints of than chronic left inguinal hernia that is easily reducible on exam.  No other obvious sources of bleeding.  Fecal occult positive. Today, hemoglobin is 6.8 with a hematocrit of 22.2.  No leukocytosis.  Mild AKI of 1.55.  Anemia panel sent, and transfusion ordered. VSS. Will admit to hospitalist for symptomatic anemia.  Patient discussed with and evaluated by Dr. Sherry Ruffing.  The patient appears reasonably stabilized for  admission considering the current resources, flow, and capabilities available in the ED at this time, and I doubt any other [facility] requiring further screening and/or treatment in the ED prior to admission.  Final Clinical Impressions(s) / ED Diagnoses   Final diagnoses:  Symptomatic anemia    ED Discharge Orders    None       Robinson, [place] N, PA-C 02/26/18 1537    Robinson, [place] N, [place] 02/26/18 1538    Robinson, [place] N, [place] 02/26/18 3716    Tegeler, Gwenyth Allegra, MD 02/27/18 1455

## 2018-02-26 NOTE — ED Triage Notes (Signed)
Patient states had blood work completed today and was told to go to the ED for evaluation for low hemoglobin. Patient states fatigue. Alert answering and following commands appropriate.

## 2018-02-27 DIAGNOSIS — I4819 Other persistent atrial fibrillation: Secondary | ICD-10-CM | POA: Diagnosis not present

## 2018-02-27 DIAGNOSIS — K922 Gastrointestinal hemorrhage, unspecified: Secondary | ICD-10-CM | POA: Diagnosis not present

## 2018-02-27 DIAGNOSIS — Z7901 Long term (current) use of anticoagulants: Secondary | ICD-10-CM

## 2018-02-27 DIAGNOSIS — K264 Chronic or unspecified duodenal ulcer with hemorrhage: Principal | ICD-10-CM

## 2018-02-27 DIAGNOSIS — D649 Anemia, unspecified: Secondary | ICD-10-CM | POA: Diagnosis not present

## 2018-02-27 DIAGNOSIS — K3189 Other diseases of stomach and duodenum: Secondary | ICD-10-CM | POA: Diagnosis not present

## 2018-02-27 DIAGNOSIS — K2980 Duodenitis without bleeding: Secondary | ICD-10-CM | POA: Diagnosis not present

## 2018-02-27 DIAGNOSIS — K315 Obstruction of duodenum: Secondary | ICD-10-CM | POA: Diagnosis not present

## 2018-02-27 LAB — TYPE AND SCREEN
ABO/RH(D): O POS
Antibody Screen: NEGATIVE
Unit division: 0
Unit division: 0

## 2018-02-27 LAB — BPAM RBC
BLOOD PRODUCT EXPIRATION DATE: 201910312359
Blood Product Expiration Date: 201910112359
ISSUE DATE / TIME: 201910041932
ISSUE DATE / TIME: 201910041515
UNIT TYPE AND RH: 5100
Unit Type and Rh: 5100

## 2018-02-27 LAB — HEMOGLOBIN AND HEMATOCRIT, BLOOD
HCT: 26.8 % — ABNORMAL LOW (ref 39.0–52.0)
HEMATOCRIT: 26.8 % — AB (ref 39.0–52.0)
Hemoglobin: 8.5 g/dL — ABNORMAL LOW (ref 13.0–17.0)
Hemoglobin: 8.3 g/dL — ABNORMAL LOW (ref 13.0–17.0)

## 2018-02-27 LAB — BASIC METABOLIC PANEL
ANION GAP: 8 (ref 5–15)
BUN: 21 mg/dL (ref 8–23)
CALCIUM: 8 mg/dL — AB (ref 8.9–10.3)
CO2: 26 mmol/L (ref 22–32)
Chloride: 100 mmol/L (ref 98–111)
Creatinine, Ser: 1.33 mg/dL — ABNORMAL HIGH (ref 0.61–1.24)
GFR, EST AFRICAN AMERICAN: 58 mL/min — AB (ref >60)
GFR, EST NON AFRICAN AMERICAN: 50 mL/min — AB (ref >60)
Glucose, Bld: 89 mg/dL (ref 70–99)
Potassium: 3.4 mmol/L — ABNORMAL LOW (ref 3.5–5.1)
Sodium: 134 mmol/L — ABNORMAL LOW (ref 135–145)

## 2018-02-27 LAB — CBC
HCT: 25 % — ABNORMAL LOW (ref 39.0–52.0)
HEMOGLOBIN: 8.1 g/dL — AB (ref 13.0–17.0)
MCH: 27.3 pg (ref 26.0–34.0)
MCHC: 32.4 g/dL (ref 30.0–36.0)
MCV: 84.2 fL (ref 78.0–100.0)
Platelets: 235 10*3/uL (ref 150–400)
RBC: 2.97 MIL/uL — AB (ref 4.22–5.81)
RDW: 16.8 % — ABNORMAL HIGH (ref 11.5–15.5)
WBC: 8 10*3/uL (ref 4.0–10.5)

## 2018-02-27 MED ORDER — SODIUM CHLORIDE 0.9 % IV SOLN
510.0000 mg | INTRAVENOUS | Status: DC
  Administered 2018-02-27: 510 mg via INTRAVENOUS
  Filled 2018-02-27: qty 17

## 2018-02-27 MED ORDER — POTASSIUM CHLORIDE 10 MEQ/100ML IV SOLN
10.0000 meq | INTRAVENOUS | Status: AC
  Administered 2018-02-27 (×2): 10 meq via INTRAVENOUS
  Filled 2018-02-27 (×2): qty 100

## 2018-02-27 NOTE — H&P (View-Only) (Signed)
Referring Provider: [facility]  Primary Care Physician:  [name], MD Primary Gastroenterologist: Owens Loffler,  MD  Reason for Consultation:   Heme positive anenia    Attending physician's note   I have taken a history, examined the patient and reviewed the chart. I agree with the Advanced Practitioner's note, impression and recommendations. 57 yr M with CHF, Afib on Xarelto, admitted with worsening symptomatic anemia Hgb 6.8  Duodenal severe diaphragmatic stricture, unable to pass adult endoscope thought to be secondary to chronic NSAID use  Denies any overt bleeding  Hgb responded appropriately to 2U prbc IV iron infusion for iron deficiency  Soft diet, NPO after midnight  Plan for EGD tomorrow AM and repeat biopsies Continue to hold Xarelto  The risks and benefits as well as alternatives of endoscopic procedure(s) have been discussed and reviewed. All questions answered. The patient agrees to proceed.     Damaris Hippo , MD [phone]     ASSESSMENT AND PLAN:     77 yo male  with multiple medical problems not limited to CKD3, CHF, AFIB( on Amiodarone and Xarelto, multiple failed DCCV, hx of ablation on Xarelto). Admitted mid Sept with symptomatic anemia. Duodenal ulcer found on EGD (taking Meloxicam). Now readmitted with recurrent drop in hgb from 9.6 to 6.8 and heme positive stool in absence of any overt GI bleeding  -Post 2 units of blood yesterday - hgb to 8.1 today.  -he hasn't been taking NSAIDs and is compliant with PPI.  -patient really doesn't want repeat inpatient EGD. He wants to go home and keep appt for the follow up EGD he is scheduled for in mid November. Given the 2-3 gram drop in hgb and need for anticoagulation I don't think this is in his best interest.   EGD 02/06/18:  Normal esophagus. - Multiple gastric polyps. - Normal mucosa was found in the entire stomach. - Normal duodenal bulb. - Acquired duodenal stenosis in the  second portion of the duodenum consistent with NSAID Diaphragm Biopsied. - One non-bleeding duodenal ulcer with no stigmata of bleeding. Biopsied. - Normal third portion of the duodenum. - No blood noted in the lumen.  Duodenal bx - no malignancy, no H.pylori  HPI: Travis [name] is a 77 y.o. male with multiple medical problems not limited to AFIB on Xarelto, chronic systolic heart failure, CKD 3.  He has been struggling with fatigue for months this led to labs in Sept which showed significant drop in hgb from around 14 to mid 7 range. He was admitted and underwent inpatient EGD by our team. A non-bleeding DU and duodenal stenosis at D2 was found). He had been taking Mobic. He was started on PPI and has outpatient EGD scheduled for mid November to document healing. Hgb at time of discharge mid Sept was 9.6. Patient has continued to struggle with fatigue. He was scheduled for heart cath this Wed.  Patient now admitted with recurrent anemia. denies any overt GI bleeding, stools not even darker than normal. No abdominal pain. No N/V. No SOB or chest pain. Patient hasn't taken any Mobic since last admission. He is taking PPI as prescribed     Past Medical History:  Diagnosis Date  . Arthritis   . Biatrial enlargement    severe  . Bicuspid aortic valve   . CHF (congestive heart failure) ([place])    JULY 2014  . Chronic renal insufficiency   . GERD (gastroesophageal reflux disease)   . History of cardioversion  12/16/2012  . History of stomach ulcers   . Hypertension   . Left bundle branch block   . Nonischemic cardiomyopathy ([place])       . Persistent atrial fibrillation    multiple prior cardioversion  . Sinus bradycardia   . Status post clamping of cerebral aneurysm 79    Past Surgical History:  Procedure Laterality Date  . ATRIAL FIBRILLATION ABLATION N/A 10/06/2017   Procedure: ATRIAL FIBRILLATION ABLATION;  Surgeon: [name], MD;  Location: [facility];  Service:  Cardiovascular;  Laterality: N/A;  . BIOPSY  02/06/2018   Procedure: BIOPSY;  Surgeon: Lavena Bullion, DO;  Location: [facility];  Service: Gastroenterology;;  . CARDIAC CATHETERIZATION    . CARDIOVERSION N/A 12/16/2012   Procedure: CARDIOVERSION;  Surgeon: Lelon Perla, MD;  Location: [facility] ENDOSCOPY;  Service: Cardiovascular;  Laterality: N/A;  . CARDIOVERSION N/A 08/14/2017   Procedure: CARDIOVERSION;  Surgeon: Josue Hector, MD;  Location: [facility] ENDOSCOPY;  Service: Cardiovascular;  Laterality: N/A;  . CARDIOVERSION N/A 09/21/2017   Procedure: CARDIOVERSION;  Surgeon: Sanda Klein, MD;  Location: [facility] ENDOSCOPY;  Service: Cardiovascular;  Laterality: N/A;  . CARDIOVERSION N/A 10/13/2017   Procedure: CARDIOVERSION;  Surgeon: Josue Hector, MD;  Location: [place];  Service: Cardiovascular;  Laterality: N/A;  . CATARACT EXTRACTION  05/2015   bilateral  . cerebral anuersym post clips    . ESOPHAGOGASTRODUODENOSCOPY N/A 02/06/2018   Procedure: ESOPHAGOGASTRODUODENOSCOPY (EGD);  Surgeon: Lavena Bullion, DO;  Location: [facility]OSCOPY;  Service: Gastroenterology;  Laterality: N/A;  . fractured left arm    . HERNIA REPAIR     lft  . INGUINAL HERNIA REPAIR  07/02/2011   Procedure: LAPAROSCOPIC INGUINAL HERNIA;  Surgeon: Harl Bowie, MD;  Location: [place];  Service: General;  Laterality: Left;  Laparoscopic left inguinal hernia repair and mesh  . left tendon repair     lft foot  . ROTATOR CUFF REPAIR     lf  . TEE WITHOUT CARDIOVERSION N/A 08/14/2017   Procedure: TRANSESOPHAGEAL ECHOCARDIOGRAM (TEE);  Surgeon: Josue Hector, MD;  Location: [facility] ENDOSCOPY;  Service: Cardiovascular;  Laterality: N/A;  . TONSILLECTOMY    . TOTAL KNEE ARTHROPLASTY Bilateral 02/06/2014   Procedure: TOTAL KNEE BILATERAL;  Surgeon: Mauri Pole, MD;  Location: [facility];  Service: Orthopedics;  Laterality: Bilateral;  . WRIST GANGLION EXCISION     lft    Prior to Admission medications   Medication Sig  Start Date End Date Taking? Authorizing Provider  acetaminophen (TYLENOL) 500 MG tablet Take 1,000 mg by mouth every 6 (six) hours as needed for mild pain or headache.    Yes [provider]  ALPRAZolam (XANAX) 0.5 MG tablet TAKE 1 TABLET BY MOUTH TWICE DAILY AS NEEDED FOR ANXIETY( SEPARATE BY 8 HOURS FROM TEMAZEPAM) Patient taking differently: Take 0.5 mg by mouth daily as needed for anxiety.  09/25/17  Yes [name], MD  amiodarone (PACERONE) 200 MG tablet Take 0.5 tablets (100 mg total) by mouth daily. 01/26/18  Yes Sherran Needs, NP  amoxicillin (AMOXIL) 500 MG tablet Take 2,000 mg by mouth See admin instructions. Take four capsules (2000 mg) by mouth one hour prior to dental procedure 02/04/18  Yes [provider]  Carboxymethylcell-Hypromellose (GENTEAL) 0.25-0.3 % GEL Place 1 drop into both eyes at bedtime.   Yes [provider]  carvedilol (COREG) 3.125 MG tablet Take 1 tablet (3.125 mg total) by mouth 2 (two) times daily with a meal.  02/04/18  Yes Larey Dresser, MD  finasteride (PROPECIA) 1 MG tablet TAKE 1 TABLET(1 MG) BY MOUTH DAILY Patient taking differently: Take 1 mg by mouth daily.  10/05/17  Yes [name], MD  Multiple Vitamins-Minerals (PRESERVISION AREDS 2) CAPS Take 1 capsule by mouth 2 (two) times daily.   Yes [provider]  OVER THE COUNTER MEDICATION Apply 1 application topically daily as needed (pain). CBD cream   Yes [provider]  pantoprazole (PROTONIX) 40 MG tablet Take 1 tablet (40 mg total) by mouth 2 (two) times daily. 02/06/18  Yes Hosie Poisson, MD  Rivaroxaban (XARELTO) 15 MG TABS tablet Take 1 tablet (15 mg total) by mouth daily with supper. Patient taking differently: Take 15 mg by mouth daily after supper.  09/21/17  Yes Kathyrn Drown D, NP  temazepam (RESTORIL) 15 MG capsule TAKE ONE CAPSULE BY MOUTH AT BEDTIME AS NEEDED Patient taking differently: Take 15 mg by mouth at bedtime.  11/10/17  Yes [name], MD  torsemide (DEMADEX) 20 MG tablet Take 2 tabs in AM and 1 tab in PM Patient taking differently: Take 20-40 mg by mouth See admin instructions. Take 2 tablets (40 mg) by mouth every morning and 1 tablet (20 mg) at night 02/04/18  Yes Larey Dresser, MD    Current Facility-Administered Medications  Medication Dose Route Frequency Provider Last Rate Last Dose  . acetaminophen (TYLENOL) tablet 1,000 mg  1,000 mg Oral Q6H PRN Elgergawy, Dawood S, MD      . albuterol (PROVENTIL) (2.5 MG/3ML) 0.083% nebulizer solution 2.5 mg  2.5 mg Nebulization Q2H PRN Elgergawy, Silver Huguenin, MD      . ALPRAZolam Duanne Moron) tablet 0.5 mg  0.5 mg Oral Daily PRN Elgergawy, Silver Huguenin, MD      . amiodarone (PACERONE) tablet 100 mg  100 mg Oral Daily Elgergawy, Silver Huguenin, MD   100 mg at 02/27/18 1005  . carvedilol (COREG) tablet 3.125 mg  3.125 mg Oral BID WC Elgergawy, Silver Huguenin, MD   3.125 mg at 02/27/18 0846  . finasteride (PROPECIA) tablet 1 mg  1 mg Oral Daily Elgergawy, Silver Huguenin, MD      . multivitamin (PROSIGHT) tablet 1 tablet  1 tablet Oral BID Elgergawy, Silver Huguenin, MD   1 tablet at 02/27/18 1005  . pantoprazole (PROTONIX) 80 mg in sodium chloride 0.9 % 250 mL (0.32 mg/mL) infusion  8 mg/hr Intravenous Continuous Elgergawy, Silver Huguenin, MD 25 mL/hr at 02/26/18 1818 8 mg/hr at 02/26/18 1818  . [START ON 03/02/2018] pantoprazole (PROTONIX) injection 40 mg  40 mg Intravenous Q12H Elgergawy, Silver Huguenin, MD      . polyvinyl alcohol (LIQUIFILM TEARS) 1.4 % ophthalmic solution 1 drop  1 drop Both Eyes QHS Elgergawy, Dawood S, MD      . potassium chloride 10 mEq in 100 mL IVPB  10 mEq Intravenous Q1 Hr x 3 Lady Deutscher, MD 100 mL/hr at 02/27/18 1005 10 mEq at 02/27/18 1005  . torsemide (DEMADEX) tablet 20 mg  20 mg Oral BID Elgergawy, Silver Huguenin, MD   20 mg at 02/27/18 0846    Allergies as of 02/26/2018  . (No Known Allergies)    Family History  Problem Relation Age of Onset  . Stroke Father 73       smoker  .  Lung cancer Mother 47       former smoker  . Stroke Mother   . Stroke Brother   . Colon  cancer Neg Hx     Social History   Socioeconomic History  . Marital status: Single    Spouse name: Not on file  . Number of children: 0  . Years of education: Not on file  . Highest education level: Not on file  Occupational History  . Occupation: Retired  Scientific laboratory technician  . Financial resource strain: Not on file  . Food insecurity:    Worry: Not on file    Inability: Not on file  . Transportation needs:    Medical: Not on file    Non-medical: Not on file  Tobacco Use  . Smoking status: Former Smoker    Packs/day: 1.50    Years: 25.00    Pack years: 37.50    Last attempt to quit: 05/26/1986    Years since quitting: 31.7  . Smokeless tobacco: Never Used  Substance and Sexual Activity  . Alcohol use: Yes    Alcohol/week: 1.0 standard drinks    Types: 1 Standard drinks or equivalent per week    Comment: stopped drinking   . Drug use: No  . Sexual activity: Not Currently  Lifestyle  . Physical activity:    Days per week: Not on file    Minutes per session: Not on file  . Stress: Not on file  Relationships  . Social connections:    Talks on phone: Not on file    Gets together: Not on file    Attends religious service: Not on file    Active member of club or organization: Not on file    Attends meetings of clubs or organizations: Not on file    Relationship status: Not on file  . Intimate partner violence:    Fear of current or ex partner: Not on file    Emotionally abused: Not on file    Physically abused: Not on file    Forced sexual activity: Not on file  Other Topics Concern  . Not on file  Social History Narrative   Homosexual. Lives alone. Not sexually active.       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  Physical Exam: Vital signs in last 24 hours: Temp:  [97.5 F (36.4 C)-98.7 F (37.1 C)] 98.7 F (37.1 C) (10/05 0543) Pulse Rate:  [57-77] 68 (10/05 0543) Resp:  [12-25] 16 (10/05 0543) BP: (101-155)/(50-98) 125/56 (10/05 0543) SpO2:  [94 %-100 %] 98 % (10/05 0600) Weight:  [74.8 kg] 74.8 kg (10/04 1247) Last BM Date: 02/26/18 General:   Alert, well-developed, white male in NAD Psych:  Pleasant, cooperative. Normal mood and affect. Eyes:  Pupils equal, sclera clear, no icterus.   Conjunctiva pink. Ears:  Normal auditory acuity. Nose:  No deformity, discharge,  or lesions. Neck:  Supple; no masses Lungs:  Clear throughout to auscultation.   No wheezes, crackles, or rhonchi.  Heart:  Regular rate and rhythm; + murmur, no edema Abdomen:  Soft, non-distended, nontender, BS active, no palp mass    Rectal:  Deferred  Msk:  Symmetrical without gross deformities. . Neurologic:  Alert and  oriented x4;  grossly normal neurologically. Skin:  Intact without significant lesions or rashes..   Intake/Output from previous day: 10/04 0701 - 10/05 0700 In: 1820.3 [I.V.:250.3; Blood:1570] Out: 1200 [Urine:1200] Intake/Output this shift: Total I/O In: 100 [  IV Piggyback:100] Out: -   Lab Results: Recent Labs    02/26/18 0935 02/26/18 1315 02/27/18 0248  WBC 6.6 6.0 8.0  HGB 6.7* 6.8* 8.1*  HCT 22.2* 22.2* 25.0*  PLT 286 295 235   BMET Recent Labs    02/26/18 0935 02/26/18 1315 02/27/18 0248  NA 133* 131* 134*  K 3.5 3.4* 3.4*  CL 95* 94* 100  CO2 28 27 26   GLUCOSE 130* 136* 89  BUN 20 24* 21  CREATININE 1.35* 1.55* 1.33*  CALCIUM 8.7* 8.5* 8.0*   LFT Recent Labs    02/26/18 1315  PROT 6.7  ALBUMIN 3.6  AST 23  ALT 19  ALKPHOS 75  BILITOT 0.7   PT/INR Recent Labs    02/26/18 0935  LABPROT 22.4*  INR 1.99   Studies/Results: No results found.   Tye Savoy, NP-C @  02/27/2018, 10:42 AM    .

## 2018-02-27 NOTE — Progress Notes (Signed)
PROGRESS NOTE    Travis [name]  CBS:[id number] DOB: 05/05/1941 DOA: 02/26/2018 PCP: [name], MD    Brief Narrative:   77 yo male  with multiple medical problems not limited to CKD3, CHF, AFIB( on Amiodarone and Xarelto, multiple failed DCCV, hx of ablation on Xarelto). Admitted mid Sept with symptomatic anemia. Duodenal ulcer found on EGD (taking Meloxicam). Now readmitted with recurrent drop in hgb from 9.6 to 6.8 and heme positive stool in absence of any overt GI bleeding.    He is now status post 2 units of packed red blood cell with a hemoglobin up to 8.1.  He has been compliant with proton pump inhibitor and has not been taking any nonsteroidals.   Assessment & Plan:   Principal Problem:   GI bleed Active Problems:   Symptomatic anemia   Persistent atrial fibrillation   Chronic systolic heart failure (HCC)   Hypertension   Hyperlipidemia    GI bleed -Recent endoscopy significant for gastric ulcer, for now continue with the Protonix drip, discussed with GI last night., keep n.p.o. after midnight if procedure felt to be needed by GI today.  Symptomatic anemia -Patient with recent hospitalization secondary to symptomatic anemia as well, endoscopy then significant for gastric ulcer with no stigmata of bleeding, thought secondary to NSAID, he resumed his Xarelto on discharge. -Continue to hold Xarelto -Hemoglobin is 6.7 today, down from 9.6, follow on anemia panel, recent work-up significant for iron deficiency anemia, likely will need IV iron.. -He will received 1 unit PRBC, repeat CBC overnight shows hgb 8.1. Check serial h/h q6hrs times 4 Repeat transfusion if necessay  Chronic systolic CHF -Management per cardiology, recommendation to continue torsemide, I will held last night and resumed today at a lower dose in the setting of worsening renal function, and you with beta-blockers per the recommendation  Persistent A. Fib -Continue with amiodarone and  beta-blockers, anticoagulation being held secondary to GI bleed  Hyperlipidemia -Statin been held by cardiology and given muscle pain  AKI and CKD stage III -Discontinue losartan, held torsemide for last night, and resumed today on a lower dose, PRBC transfusion gave some volume and helped as well  Hypertension -Pressure acceptable  DVT prophylaxis: SCDs Code Status: Full code Disposition Plan: Likely home in a.m.  Will likely need a GI procedure prior to discharge.   Consultants:  [facility].    Subjective: 77 year old man admitted with recurrent anemia due to GI bleeding with heme positive stool but no visible blood in stool.  He is being evaluated by GI to determine next best steps.  He is status post 2 units of packed red blood cells overnight.  Objective: Vitals:   02/27/18 0200 02/27/18 0543 02/27/18 0600 02/27/18 1100  BP:  (!) 125/56  138/70  Pulse:  68  (!) 59  Resp:  16  16  Temp:  98.7 F (37.1 C)  97.8 F (36.6 C)  TempSrc:  Oral  Oral  SpO2: 99%  98% 94%  Weight:      Height:        Intake/Output Summary (Last 24 hours) at 02/27/2018 1254 Last data filed at 02/27/2018 0852 Gross per 24 hour  Intake 1920.31 ml  Output 1200 ml  Net 720.31 ml   Filed Weights   02/26/18 1247  Weight: 74.8 kg    Examination:  General exam: Appears calm and comfortable  Respiratory system: Clear to auscultation. Respiratory effort normal. Cardiovascular system: S1 & S2 heard, RRR. No JVD,  murmurs, rubs, gallops or clicks. No pedal edema. Gastrointestinal system: Abdomen is nondistended, soft and nontender. No organomegaly or masses felt. Normal bowel sounds heard. Central nervous system: Alert and oriented. No focal neurological deficits. Extremities: Symmetric 5 x 5 power. Skin: No rashes, lesions or ulcers Psychiatry: Judgement and insight appear normal. Mood & affect appropriate.     Data Reviewed: I have personally reviewed following labs and imaging  studies  CBC: Recent Labs  Lab 02/26/18 0935 02/26/18 1315 02/27/18 0248  WBC 6.6 6.0 8.0  HGB 6.7* 6.8* 8.1*  HCT 22.2* 22.2* 25.0*  MCV 87.7 87.1 84.2  PLT 286 295 735   Basic Metabolic Panel: Recent Labs  Lab 02/26/18 0935 02/26/18 1315 02/27/18 0248  NA 133* 131* 134*  K 3.5 3.4* 3.4*  CL 95* 94* 100  CO2 28 27 26   GLUCOSE 130* 136* 89  BUN 20 24* 21  CREATININE 1.35* 1.55* 1.33*  CALCIUM 8.7* 8.5* 8.0*   GFR: Estimated Creatinine Clearance: 45 mL/min (A) (by C-G formula based on SCr of 1.33 mg/dL (H)). Liver Function Tests: Recent Labs  Lab 02/26/18 1315  AST 23  ALT 19  ALKPHOS 75  BILITOT 0.7  PROT 6.7  ALBUMIN 3.6   No results for input(s): LIPASE, AMYLASE in the last 168 hours. No results for input(s): AMMONIA in the last 168 hours. Coagulation Profile: Recent Labs  Lab 02/26/18 0935  INR 1.99   Cardiac Enzymes: No results for input(s): CKTOTAL, CKMB, CKMBINDEX, TROPONINI in the last 168 hours. BNP (last 3 results) No results for input(s): PROBNP in the last 8760 hours. HbA1C: No results for input(s): HGBA1C in the last 72 hours. CBG: No results for input(s): GLUCAP in the last 168 hours. Lipid Profile: No results for input(s): CHOL, HDL, LDLCALC, TRIG, CHOLHDL, LDLDIRECT in the last 72 hours. Thyroid Function Tests: No results for input(s): TSH, T4TOTAL, FREET4, T3FREE, THYROIDAB in the last 72 hours. Anemia Panel: Recent Labs    02/26/18 1455  VITAMINB12 202  FOLATE 15.0  FERRITIN 46  TIBC 456*  IRON 14*  RETICCTPCT 2.4   Sepsis Labs: No results for input(s): PROCALCITON, LATICACIDVEN in the last 168 hours.  No results found for this or any previous visit (from the past 240 hour(s)).       Radiology Studies: No results found.      Scheduled Meds: . amiodarone  100 mg Oral Daily  . carvedilol  3.125 mg Oral BID WC  . multivitamin  1 tablet Oral BID  . [START ON 03/02/2018] pantoprazole  40 mg Intravenous Q12H  .  polyvinyl alcohol  1 drop Both Eyes QHS  . torsemide  20 mg Oral BID   Continuous Infusions: . ferumoxytol    . pantoprozole (PROTONIX) infusion 8 mg/hr (02/26/18 1818)     LOS: 0 days    Time spent: 45 minutes    Lady Deutscher, MD [facility] Pager [phone]  If 7PM-7AM, please contact night-coverage [email or web address] Password [email or web address] 02/27/2018, 12:54 PM

## 2018-02-27 NOTE — Consult Note (Addendum)
Referring Provider: [facility]  Primary Care Physician:  [name], MD Primary Gastroenterologist: Owens Loffler,  MD  Reason for Consultation:   Heme positive anenia    Attending physician's note   I have taken a history, examined the patient and reviewed the chart. I agree with the Advanced Practitioner's note, impression and recommendations. 6 yr M with CHF, Afib on Xarelto, admitted with worsening symptomatic anemia Hgb 6.8  Duodenal severe diaphragmatic stricture, unable to pass adult endoscope thought to be secondary to chronic NSAID use  Denies any overt bleeding  Hgb responded appropriately to 2U prbc IV iron infusion for iron deficiency  Soft diet, NPO after midnight  Plan for EGD tomorrow AM and repeat biopsies Continue to hold Xarelto  The risks and benefits as well as alternatives of endoscopic procedure(s) have been discussed and reviewed. All questions answered. The patient agrees to proceed.     Damaris Hippo , MD [phone]     ASSESSMENT AND PLAN:     77 yo male  with multiple medical problems not limited to CKD3, CHF, AFIB( on Amiodarone and Xarelto, multiple failed DCCV, hx of ablation on Xarelto). Admitted mid Sept with symptomatic anemia. Duodenal ulcer found on EGD (taking Meloxicam). Now readmitted with recurrent drop in hgb from 9.6 to 6.8 and heme positive stool in absence of any overt GI bleeding  -Post 2 units of blood yesterday - hgb to 8.1 today.  -he hasn't been taking NSAIDs and is compliant with PPI.  -patient really doesn't want repeat inpatient EGD. He wants to go home and keep appt for the follow up EGD he is scheduled for in mid November. Given the 2-3 gram drop in hgb and need for anticoagulation I don't think this is in his best interest.   EGD 02/06/18:  Normal esophagus. - Multiple gastric polyps. - Normal mucosa was found in the entire stomach. - Normal duodenal bulb. - Acquired duodenal stenosis in the  second portion of the duodenum consistent with NSAID Diaphragm Biopsied. - One non-bleeding duodenal ulcer with no stigmata of bleeding. Biopsied. - Normal third portion of the duodenum. - No blood noted in the lumen.  Duodenal bx - no malignancy, no H.pylori  HPI: Travis [name] is a 77 y.o. male with multiple medical problems not limited to AFIB on Xarelto, chronic systolic heart failure, CKD 3.  He has been struggling with fatigue for months this led to labs in Sept which showed significant drop in hgb from around 14 to mid 7 range. He was admitted and underwent inpatient EGD by our team. A non-bleeding DU and duodenal stenosis at D2 was found). He had been taking Mobic. He was started on PPI and has outpatient EGD scheduled for mid November to document healing. Hgb at time of discharge mid Sept was 9.6. Patient has continued to struggle with fatigue. He was scheduled for heart cath this Wed.  Patient now admitted with recurrent anemia. denies any overt GI bleeding, stools not even darker than normal. No abdominal pain. No N/V. No SOB or chest pain. Patient hasn't taken any Mobic since last admission. He is taking PPI as prescribed     Past Medical History:  Diagnosis Date  . Arthritis   . Biatrial enlargement    severe  . Bicuspid aortic valve   . CHF (congestive heart failure) ([place])    JULY 2014  . Chronic renal insufficiency   . GERD (gastroesophageal reflux disease)   . History of cardioversion  12/16/2012  . History of stomach ulcers   . Hypertension   . Left bundle branch block   . Nonischemic cardiomyopathy ([place])       . Persistent atrial fibrillation    multiple prior cardioversion  . Sinus bradycardia   . Status post clamping of cerebral aneurysm 79    Past Surgical History:  Procedure Laterality Date  . ATRIAL FIBRILLATION ABLATION N/A 10/06/2017   Procedure: ATRIAL FIBRILLATION ABLATION;  Surgeon: [name], MD;  Location: [facility];  Service:  Cardiovascular;  Laterality: N/A;  . BIOPSY  02/06/2018   Procedure: BIOPSY;  Surgeon: Lavena Bullion, DO;  Location: [facility];  Service: Gastroenterology;;  . CARDIAC CATHETERIZATION    . CARDIOVERSION N/A 12/16/2012   Procedure: CARDIOVERSION;  Surgeon: Lelon Perla, MD;  Location: [facility] ENDOSCOPY;  Service: Cardiovascular;  Laterality: N/A;  . CARDIOVERSION N/A 08/14/2017   Procedure: CARDIOVERSION;  Surgeon: Josue Hector, MD;  Location: [facility] ENDOSCOPY;  Service: Cardiovascular;  Laterality: N/A;  . CARDIOVERSION N/A 09/21/2017   Procedure: CARDIOVERSION;  Surgeon: Sanda Klein, MD;  Location: [place] [facility] ENDOSCOPY;  Service: Cardiovascular;  Laterality: N/A;  . CARDIOVERSION N/A 10/13/2017   Procedure: CARDIOVERSION;  Surgeon: Josue Hector, MD;  Location: [facility];  Service: Cardiovascular;  Laterality: N/A;  . CATARACT EXTRACTION  05/2015   bilateral  . cerebral anuersym post clips    . ESOPHAGOGASTRODUODENOSCOPY N/A 02/06/2018   Procedure: ESOPHAGOGASTRODUODENOSCOPY (EGD);  Surgeon: Lavena Bullion, DO;  Location: [facility] ENDOSCOPY;  Service: Gastroenterology;  Laterality: N/A;  . fractured left arm    . HERNIA REPAIR     lft  . INGUINAL HERNIA REPAIR  07/02/2011   Procedure: LAPAROSCOPIC INGUINAL HERNIA;  Surgeon: Harl Bowie, MD;  Location: [place];  Service: General;  Laterality: Left;  Laparoscopic left inguinal hernia repair and mesh  . left tendon repair     lft foot  . ROTATOR CUFF REPAIR     lf  . TEE WITHOUT CARDIOVERSION N/A 08/14/2017   Procedure: TRANSESOPHAGEAL ECHOCARDIOGRAM (TEE);  Surgeon: Josue Hector, MD;  Location: [facility] ENDOSCOPY;  Service: Cardiovascular;  Laterality: N/A;  . TONSILLECTOMY    . TOTAL KNEE ARTHROPLASTY Bilateral 02/06/2014   Procedure: TOTAL KNEE BILATERAL;  Surgeon: Mauri Pole, MD;  Location: [facility];  Service: Orthopedics;  Laterality: Bilateral;  . WRIST GANGLION EXCISION     lft    Prior to Admission medications   Medication Sig  Start Date End Date Taking? Authorizing Provider  acetaminophen (TYLENOL) 500 MG tablet Take 1,000 mg by mouth every 6 (six) hours as needed for mild pain or headache.    Yes [provider]  ALPRAZolam (XANAX) 0.5 MG tablet TAKE 1 TABLET BY MOUTH TWICE DAILY AS NEEDED FOR ANXIETY( SEPARATE BY 8 HOURS FROM TEMAZEPAM) Patient taking differently: Take 0.5 mg by mouth daily as needed for anxiety.  09/25/17  Yes [name], MD  amiodarone (PACERONE) 200 MG tablet Take 0.5 tablets (100 mg total) by mouth daily. 01/26/18  Yes Sherran Needs, NP  amoxicillin (AMOXIL) 500 MG tablet Take 2,000 mg by mouth See admin instructions. Take four capsules (2000 mg) by mouth one hour prior to dental procedure 02/04/18  Yes [provider]  Carboxymethylcell-Hypromellose (GENTEAL) 0.25-0.3 % GEL Place 1 drop into both eyes at bedtime.   Yes [provider]  carvedilol (COREG) 3.125 MG tablet Take 1 tablet (3.125 mg total) by mouth 2 (two) times daily with a meal.  02/04/18  Yes Larey Dresser, MD  finasteride (PROPECIA) 1 MG tablet TAKE 1 TABLET(1 MG) BY MOUTH DAILY Patient taking differently: Take 1 mg by mouth daily.  10/05/17  Yes [name], MD  Multiple Vitamins-Minerals (PRESERVISION AREDS 2) CAPS Take 1 capsule by mouth 2 (two) times daily.   Yes [provider]  OVER THE COUNTER MEDICATION Apply 1 application topically daily as needed (pain). CBD cream   Yes [provider]  pantoprazole (PROTONIX) 40 MG tablet Take 1 tablet (40 mg total) by mouth 2 (two) times daily. 02/06/18  Yes Hosie Poisson, MD  Rivaroxaban (XARELTO) 15 MG TABS tablet Take 1 tablet (15 mg total) by mouth daily with supper. Patient taking differently: Take 15 mg by mouth daily after supper.  09/21/17  Yes Kathyrn Drown D, NP  temazepam (RESTORIL) 15 MG capsule TAKE ONE CAPSULE BY MOUTH AT BEDTIME AS NEEDED Patient taking differently: Take 15 mg by mouth at bedtime.  11/10/17  Yes [name], MD  torsemide (DEMADEX) 20 MG tablet Take 2 tabs in AM and 1 tab in PM Patient taking differently: Take 20-40 mg by mouth See admin instructions. Take 2 tablets (40 mg) by mouth every morning and 1 tablet (20 mg) at night 02/04/18  Yes Larey Dresser, MD    Current Facility-Administered Medications  Medication Dose Route Frequency Provider Last Rate Last Dose  . acetaminophen (TYLENOL) tablet 1,000 mg  1,000 mg Oral Q6H PRN Elgergawy, Dawood S, MD      . albuterol (PROVENTIL) (2.5 MG/3ML) 0.083% nebulizer solution 2.5 mg  2.5 mg Nebulization Q2H PRN Elgergawy, Silver Huguenin, MD      . ALPRAZolam Duanne Moron) tablet 0.5 mg  0.5 mg Oral Daily PRN Elgergawy, Silver Huguenin, MD      . amiodarone (PACERONE) tablet 100 mg  100 mg Oral Daily Elgergawy, Silver Huguenin, MD   100 mg at 02/27/18 1005  . carvedilol (COREG) tablet 3.125 mg  3.125 mg Oral BID WC Elgergawy, Silver Huguenin, MD   3.125 mg at 02/27/18 0846  . finasteride (PROPECIA) tablet 1 mg  1 mg Oral Daily Elgergawy, Silver Huguenin, MD      . multivitamin (PROSIGHT) tablet 1 tablet  1 tablet Oral BID Elgergawy, Silver Huguenin, MD   1 tablet at 02/27/18 1005  . pantoprazole (PROTONIX) 80 mg in sodium chloride 0.9 % 250 mL (0.32 mg/mL) infusion  8 mg/hr Intravenous Continuous Elgergawy, Silver Huguenin, MD 25 mL/hr at 02/26/18 1818 8 mg/hr at 02/26/18 1818  . [START ON 03/02/2018] pantoprazole (PROTONIX) injection 40 mg  40 mg Intravenous Q12H Elgergawy, Silver Huguenin, MD      . polyvinyl alcohol (LIQUIFILM TEARS) 1.4 % ophthalmic solution 1 drop  1 drop Both Eyes QHS Elgergawy, Dawood S, MD      . potassium chloride 10 mEq in 100 mL IVPB  10 mEq Intravenous Q1 Hr x 3 Lady Deutscher, MD 100 mL/hr at 02/27/18 1005 10 mEq at 02/27/18 1005  . torsemide (DEMADEX) tablet 20 mg  20 mg Oral BID Elgergawy, Silver Huguenin, MD   20 mg at 02/27/18 0846    Allergies as of 02/26/2018  . (No Known Allergies)    Family History  Problem Relation Age of Onset  . Stroke Father 54       smoker  .  Lung cancer Mother 42       former smoker  . Stroke Mother   . Stroke Brother   . Colon  cancer Neg Hx     Social History   Socioeconomic History  . Marital status: Single    Spouse name: Not on file  . Number of children: 0  . Years of education: Not on file  . Highest education level: Not on file  Occupational History  . Occupation: Retired  Scientific laboratory technician  . Financial resource strain: Not on file  . Food insecurity:    Worry: Not on file    Inability: Not on file  . Transportation needs:    Medical: Not on file    Non-medical: Not on file  Tobacco Use  . Smoking status: Former Smoker    Packs/day: 1.50    Years: 25.00    Pack years: 37.50    Last attempt to quit: 05/26/1986    Years since quitting: 31.7  . Smokeless tobacco: Never Used  Substance and Sexual Activity  . Alcohol use: Yes    Alcohol/week: 1.0 standard drinks    Types: 1 Standard drinks or equivalent per week    Comment: stopped drinking   . Drug use: No  . Sexual activity: Not Currently  Lifestyle  . Physical activity:    Days per week: Not on file    Minutes per session: Not on file  . Stress: Not on file  Relationships  . Social connections:    Talks on phone: Not on file    Gets together: Not on file    Attends religious service: Not on file    Active member of club or organization: Not on file    Attends meetings of clubs or organizations: Not on file    Relationship status: Not on file  . Intimate partner violence:    Fear of current or ex partner: Not on file    Emotionally abused: Not on file    Physically abused: Not on file    Forced sexual activity: Not on file  Other Topics Concern  . Not on file  Social History Narrative   Homosexual. Lives alone. Not sexually active.       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  Physical Exam: Vital signs in last 24 hours: Temp:  [97.5 F (36.4 C)-98.7 F (37.1 C)] 98.7 F (37.1 C) (10/05 0543) Pulse Rate:  [57-77] 68 (10/05 0543) Resp:  [12-25] 16 (10/05 0543) BP: (101-155)/(50-98) 125/56 (10/05 0543) SpO2:  [94 %-100 %] 98 % (10/05 0600) Weight:  [74.8 kg] 74.8 kg (10/04 1247) Last BM Date: 02/26/18 General:   Alert, well-developed, white male in NAD Psych:  Pleasant, cooperative. Normal mood and affect. Eyes:  Pupils equal, sclera clear, no icterus.   Conjunctiva pink. Ears:  Normal auditory acuity. Nose:  No deformity, discharge,  or lesions. Neck:  Supple; no masses Lungs:  Clear throughout to auscultation.   No wheezes, crackles, or rhonchi.  Heart:  Regular rate and rhythm; + murmur, no edema Abdomen:  Soft, non-distended, nontender, BS active, no palp mass    Rectal:  Deferred  Msk:  Symmetrical without gross deformities. . Neurologic:  Alert and  oriented x4;  grossly normal neurologically. Skin:  Intact without significant lesions or rashes..   Intake/Output from previous day: 10/04 0701 - 10/05 0700 In: 1820.3 [I.V.:250.3; Blood:1570] Out: 1200 [Urine:1200] Intake/Output this shift: Total I/O In: 100 [  IV Piggyback:100] Out: -   Lab Results: Recent Labs    02/26/18 0935 02/26/18 1315 02/27/18 0248  WBC 6.6 6.0 8.0  HGB 6.7* 6.8* 8.1*  HCT 22.2* 22.2* 25.0*  PLT 286 295 235   BMET Recent Labs    02/26/18 0935 02/26/18 1315 02/27/18 0248  NA 133* 131* 134*  K 3.5 3.4* 3.4*  CL 95* 94* 100  CO2 28 27 26   GLUCOSE 130* 136* 89  BUN 20 24* 21  CREATININE 1.35* 1.55* 1.33*  CALCIUM 8.7* 8.5* 8.0*   LFT Recent Labs    02/26/18 1315  PROT 6.7  ALBUMIN 3.6  AST 23  ALT 19  ALKPHOS 75  BILITOT 0.7   PT/INR Recent Labs    02/26/18 0935  LABPROT 22.4*  INR 1.99   Studies/Results: No results found.   Tye Savoy, NP-C @  02/27/2018, 10:42 AM    .

## 2018-02-28 ENCOUNTER — Observation Stay (HOSPITAL_COMMUNITY): Payer: Medicare Other | Admitting: Anesthesiology

## 2018-02-28 ENCOUNTER — Encounter (HOSPITAL_COMMUNITY)
Admission: EM | Disposition: A | Discharge status: Home / Self Care | Payer: Self-pay | Source: Home / Self Care | Attending: Internal Medicine

## 2018-02-28 ENCOUNTER — Encounter (HOSPITAL_COMMUNITY): Payer: Self-pay | Admitting: Anesthesiology

## 2018-02-28 DIAGNOSIS — K2980 Duodenitis without bleeding: Secondary | ICD-10-CM | POA: Diagnosis not present

## 2018-02-28 DIAGNOSIS — I428 Other cardiomyopathies: Secondary | ICD-10-CM | POA: Diagnosis not present

## 2018-02-28 DIAGNOSIS — K315 Obstruction of duodenum: Secondary | ICD-10-CM | POA: Diagnosis not present

## 2018-02-28 DIAGNOSIS — K3189 Other diseases of stomach and duodenum: Secondary | ICD-10-CM

## 2018-02-28 DIAGNOSIS — I4819 Other persistent atrial fibrillation: Secondary | ICD-10-CM | POA: Diagnosis not present

## 2018-02-28 DIAGNOSIS — K311 Adult hypertrophic pyloric stenosis: Secondary | ICD-10-CM | POA: Diagnosis not present

## 2018-02-28 DIAGNOSIS — K269 Duodenal ulcer, unspecified as acute or chronic, without hemorrhage or perforation: Secondary | ICD-10-CM | POA: Diagnosis present

## 2018-02-28 DIAGNOSIS — I5022 Chronic systolic (congestive) heart failure: Secondary | ICD-10-CM | POA: Diagnosis not present

## 2018-02-28 DIAGNOSIS — N179 Acute kidney failure, unspecified: Secondary | ICD-10-CM | POA: Diagnosis not present

## 2018-02-28 DIAGNOSIS — D649 Anemia, unspecified: Secondary | ICD-10-CM | POA: Diagnosis not present

## 2018-02-28 DIAGNOSIS — I447 Left bundle-branch block, unspecified: Secondary | ICD-10-CM | POA: Diagnosis not present

## 2018-02-28 DIAGNOSIS — I110 Hypertensive heart disease with heart failure: Secondary | ICD-10-CM | POA: Diagnosis not present

## 2018-02-28 DIAGNOSIS — D62 Acute posthemorrhagic anemia: Secondary | ICD-10-CM | POA: Diagnosis not present

## 2018-02-28 DIAGNOSIS — N183 Chronic kidney disease, stage 3 (moderate): Secondary | ICD-10-CM | POA: Diagnosis not present

## 2018-02-28 DIAGNOSIS — I130 Hypertensive heart and chronic kidney disease with heart failure and stage 1 through stage 4 chronic kidney disease, or unspecified chronic kidney disease: Secondary | ICD-10-CM | POA: Diagnosis not present

## 2018-02-28 DIAGNOSIS — Q231 Congenital insufficiency of aortic valve: Secondary | ICD-10-CM | POA: Diagnosis not present

## 2018-02-28 DIAGNOSIS — K264 Chronic or unspecified duodenal ulcer with hemorrhage: Secondary | ICD-10-CM | POA: Diagnosis not present

## 2018-02-28 DIAGNOSIS — I509 Heart failure, unspecified: Secondary | ICD-10-CM | POA: Diagnosis not present

## 2018-02-28 DIAGNOSIS — K922 Gastrointestinal hemorrhage, unspecified: Secondary | ICD-10-CM | POA: Diagnosis not present

## 2018-02-28 HISTORY — PX: ESOPHAGOGASTRODUODENOSCOPY: SHX5428

## 2018-02-28 HISTORY — PX: BIOPSY: SHX5522

## 2018-02-28 LAB — HEMOGLOBIN AND HEMATOCRIT, BLOOD
HCT: 25.8 % — ABNORMAL LOW (ref 39.0–52.0)
HEMATOCRIT: 25.7 % — AB (ref 39.0–52.0)
HEMOGLOBIN: 8 g/dL — AB (ref 13.0–17.0)
HEMOGLOBIN: 8.1 g/dL — AB (ref 13.0–17.0)

## 2018-02-28 SURGERY — EGD (ESOPHAGOGASTRODUODENOSCOPY)
Anesthesia: Monitor Anesthesia Care

## 2018-02-28 MED ORDER — SUCRALFATE 1 G PO TABS
1.0000 g | ORAL_TABLET | Freq: Three times a day (TID) | ORAL | Status: DC
  Administered 2018-02-28 – 2018-03-01 (×4): 1 g via ORAL
  Filled 2018-02-28 (×9): qty 1

## 2018-02-28 MED ORDER — LIDOCAINE HCL (CARDIAC) PF 100 MG/5ML IV SOSY
PREFILLED_SYRINGE | INTRAVENOUS | Status: DC | PRN
  Administered 2018-02-28: 20 mg via INTRATRACHEAL

## 2018-02-28 MED ORDER — LACTATED RINGERS IV SOLN
INTRAVENOUS | Status: DC | PRN
  Administered 2018-02-28: 10:00:00 via INTRAVENOUS

## 2018-02-28 MED ORDER — LACTATED RINGERS IV SOLN
INTRAVENOUS | Status: DC
  Administered 2018-02-28: 09:00:00 via INTRAVENOUS

## 2018-02-28 MED ORDER — PROPOFOL 10 MG/ML IV BOLUS
INTRAVENOUS | Status: DC | PRN
  Administered 2018-02-28: 10 mg via INTRAVENOUS
  Administered 2018-02-28 (×8): 20 mg via INTRAVENOUS

## 2018-02-28 NOTE — Plan of Care (Signed)
  Problem: Education: Goal: Knowledge of General Education information will improve Description Including pain rating scale, medication(s)/side effects and non-pharmacologic comfort measures Outcome: Progressing   Problem: Health Behavior/Discharge Planning: Goal: Ability to manage health-related needs will improve Outcome: Progressing   Problem: Clinical Measurements: Goal: Ability to maintain clinical measurements within normal limits will improve Outcome: Progressing Goal: Will remain free from infection Outcome: Progressing   Problem: Pain Managment: Goal: General experience of comfort will improve Outcome: Progressing   Problem: Safety: Goal: Ability to remain free from injury will improve Outcome: Progressing   Problem: Skin Integrity: Goal: Risk for impaired skin integrity will decrease Outcome: Progressing   Forestine Chute, RN 02/28/2018 11:26 PM

## 2018-02-28 NOTE — Progress Notes (Signed)
Pt has clear liquid dinner tray at bedside.

## 2018-02-28 NOTE — Anesthesia Preprocedure Evaluation (Addendum)
Anesthesia Evaluation  Patient identified by MRN, date of birth, ID band Patient awake    Reviewed: Allergy & Precautions, NPO status , Patient's Chart, lab work & pertinent test results  History of Anesthesia Complications Negative for: history of anesthetic complications  Airway Mallampati: IV  TM Distance: <3 FB Neck ROM: Full    Dental  (+) Teeth Intact   Pulmonary former smoker,    breath sounds clear to auscultation       Cardiovascular hypertension, Pt. on medications +CHF  + dysrhythmias Atrial Fibrillation  Rhythm:Regular     Neuro/Psych Anxiety    GI/Hepatic GERD  Medicated and Controlled,Possible gi bleed, 2 u prbcs given with decreased symptoms   Endo/Other    Renal/GU Renal InsufficiencyRenal disease     Musculoskeletal  (+) Arthritis ,   Abdominal   Peds  Hematology  (+) anemia , xarelto held   Anesthesia Other Findings   Reproductive/Obstetrics                             Anesthesia Physical Anesthesia Plan  ASA: III  Anesthesia Plan: MAC   Post-op Pain Management:    Induction: Intravenous  PONV Risk Score and Plan: 1 and Treatment may vary due to age or medical condition  Airway Management Planned: Nasal Cannula  Additional Equipment:   Intra-op Plan:   Post-operative Plan:   Informed Consent: I have reviewed the patients History and Physical, chart, labs and discussed the procedure including the risks, benefits and alternatives for the proposed anesthesia with the patient or authorized representative who has indicated his/her understanding and acceptance.   Dental advisory given  Plan Discussed with: CRNA, Anesthesiologist and Surgeon  Anesthesia Plan Comments:        Anesthesia Quick Evaluation

## 2018-02-28 NOTE — H&P (Signed)
History and Physical    Travis REIERSON WNU:[id number] DOB: 08/18/1940 DOA: 02/26/2018  PCP: [name], MD   Patient coming from: Home (patient is being admitted from observation status to inpatient)  I have personally briefly reviewed patient's old medical records in [place]  Chief Complaint: Hemoglobin 6.7  HPI: Travis [name] is a 77 y.o. male with medical history significant of atrial fibrillation on Xarelto, chronic systolic CHF, hypertension, chronic kidney disease stage III, and anxiety, with recent hospitalization secondary to symptomatic anemia, endoscopy significant for severe diaphragmatic stricture unable to pass adult scope thought secondary to chronic NSAID use., which was held on discharge, he was resumed on Xarelto, patient has been complaining with some fatigue, and had blood work-up done in anticipation of cardiac cath needs soon, his hemoglobin was noted to be 6.7, and creatinine elevated to 1.6, so he was sent for further evaluation.  He was placed in observation received 2 units of packed red blood cells and GI was consulted.  An upper endoscopy was performed today and he was found to have mildly erythematous mucosa without bleeding in the prepyloric region of the stomach, and acquired severe stenosis with ulcerated mucosa surrounding inflammatory changes in the second portion of the duodenum and was traversed after downsizing the scope to the pediatric endoscope.  There was no visible vessel or active bleeding.  Clear liquid diet was recommended and advance to mechanical soft diet Protonix 40 twice daily, sucralfate 1 g p.o. 4 times daily, no high-dose aspirin, ibuprofen, naproxen or other nonsteroidal anti-inflammatory medications.  Recent will require repeat upper endoscopy in 8 weeks.  He may resume Xarelto at prior dose in 1 week.  IV iron has been ordered due to acute blood loss anemia and therefore the patient is being admitted to the hospital for treatment with  IV iron.   Review of Systems: As per HPI otherwise all other systems reviewed and  negative.   Past Medical History:  Diagnosis Date  . Arthritis   . Biatrial enlargement    severe  . Bicuspid aortic valve   . CHF (congestive heart failure) ([place])    JULY 2014  . Chronic renal insufficiency   . GERD (gastroesophageal reflux disease)   . History of cardioversion 12/16/2012  . History of stomach ulcers   . Hypertension   . Left bundle branch block   . Nonischemic cardiomyopathy ([place])       . Persistent atrial fibrillation    multiple prior cardioversion  . Sinus bradycardia   . Status post clamping of cerebral aneurysm 79    Past Surgical History:  Procedure Laterality Date  . ATRIAL FIBRILLATION ABLATION N/A 10/06/2017   Procedure: ATRIAL FIBRILLATION ABLATION;  Surgeon: [name], MD;  Location: [facility];  Service: Cardiovascular;  Laterality: N/A;  . BIOPSY  02/06/2018   Procedure: BIOPSY;  Surgeon: Lavena Bullion, DO;  Location: [facility];  Service: Gastroenterology;;  . CARDIAC CATHETERIZATION    . CARDIOVERSION N/A 12/16/2012   Procedure: CARDIOVERSION;  Surgeon: Lelon Perla, MD;  Location: [facility] ENDOSCOPY;  Service: Cardiovascular;  Laterality: N/A;  . CARDIOVERSION N/A 08/14/2017   Procedure: CARDIOVERSION;  Surgeon: Josue Hector, MD;  Location: [facility] ENDOSCOPY;  Service: Cardiovascular;  Laterality: N/A;  . CARDIOVERSION N/A 09/21/2017   Procedure: CARDIOVERSION;  Surgeon: Sanda Klein, MD;  Location: [place] ENDOSCOPY;  Service: Cardiovascular;  Laterality: N/A;  . CARDIOVERSION N/A 10/13/2017   Procedure: CARDIOVERSION;  Surgeon: Josue Hector,  MD;  Location: [place] [facility];  Service: Cardiovascular;  Laterality: N/A;  . CATARACT EXTRACTION  05/2015   bilateral  . cerebral anuersym post clips    . ESOPHAGOGASTRODUODENOSCOPY N/A 02/06/2018   Procedure: ESOPHAGOGASTRODUODENOSCOPY (EGD);  Surgeon: Lavena Bullion, DO;  Location: [facility] ENDOSCOPY;   Service: Gastroenterology;  Laterality: N/A;  . fractured left arm    . HERNIA REPAIR     lft  . INGUINAL HERNIA REPAIR  07/02/2011   Procedure: LAPAROSCOPIC INGUINAL HERNIA;  Surgeon: Harl Bowie, MD;  Location: [place];  Service: General;  Laterality: Left;  Laparoscopic left inguinal hernia repair and mesh  . left tendon repair     lft foot  . ROTATOR CUFF REPAIR     lf  . TEE WITHOUT CARDIOVERSION N/A 08/14/2017   Procedure: TRANSESOPHAGEAL ECHOCARDIOGRAM (TEE);  Surgeon: Josue Hector, MD;  Location: [facility] ENDOSCOPY;  Service: Cardiovascular;  Laterality: N/A;  . TONSILLECTOMY    . TOTAL KNEE ARTHROPLASTY Bilateral 02/06/2014   Procedure: TOTAL KNEE BILATERAL;  Surgeon: Mauri Pole, MD;  Location: [facility];  Service: Orthopedics;  Laterality: Bilateral;  . WRIST GANGLION EXCISION     lft    Social History   Social History Narrative   Homosexual. Lives alone. Not sexually active.       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 He has a 37.50 pack-year smoking history. He has never used smokeless tobacco. He reports that he drinks about 1.0 standard drinks of alcohol per week. He reports that he does not use drugs.  No Known Allergies  Family History  Problem Relation Age of Onset  . Stroke Father 38       smoker  . Lung cancer Mother 80       former smoker  . Stroke Mother   . Stroke Brother   . Colon cancer Neg Hx     Prior to Admission medications   Medication Sig Start Date End Date Taking? Authorizing Provider  acetaminophen (TYLENOL) 500 MG tablet Take 1,000 mg by mouth every 6 (six) hours as needed for mild pain or headache.    Yes [provider]  ALPRAZolam (XANAX) 0.5 MG tablet TAKE 1 TABLET BY MOUTH TWICE DAILY AS NEEDED FOR ANXIETY( SEPARATE BY 8 HOURS FROM TEMAZEPAM) Patient taking differently: Take  0.5 mg by mouth daily as needed for anxiety.  09/25/17  Yes [name], MD  amiodarone (PACERONE) 200 MG tablet Take 0.5 tablets (100 mg total) by mouth daily. 01/26/18  Yes Sherran Needs, NP  amoxicillin (AMOXIL) 500 MG tablet Take 2,000 mg by mouth See admin instructions. Take four capsules (2000 mg) by mouth one hour prior to dental procedure 02/04/18  Yes [provider]  Carboxymethylcell-Hypromellose (GENTEAL) 0.25-0.3 % GEL Place 1 drop into both eyes at bedtime.   Yes [provider]  carvedilol (COREG) 3.125 MG tablet Take 1 tablet (3.125 mg total) by mouth 2 (two) times daily with a meal. 02/04/18  Yes Larey Dresser, MD  finasteride (PROPECIA) 1 MG tablet TAKE 1 TABLET(1 MG) BY MOUTH DAILY Patient taking differently: Take 1 mg by mouth daily.  10/05/17  Yes [name], MD  Multiple Vitamins-Minerals (PRESERVISION AREDS 2) CAPS Take 1 capsule by mouth 2 (two) times daily.   Yes [provider]  OVER THE COUNTER MEDICATION Apply 1 application topically daily as needed (pain). CBD cream   Yes [provider]  pantoprazole (PROTONIX) 40 MG tablet Take 1 tablet (40 mg total) by mouth 2 (two) times daily. 02/06/18  Yes Hosie Poisson, MD  Rivaroxaban (XARELTO) 15 MG TABS tablet Take 1 tablet (15 mg total) by mouth daily with supper. Patient taking differently: Take 15 mg by mouth daily after supper.  09/21/17  Yes Kathyrn Drown D, NP  temazepam (RESTORIL) 15 MG capsule TAKE ONE CAPSULE BY MOUTH AT BEDTIME AS NEEDED Patient taking differently: Take 15 mg by mouth at bedtime.  11/10/17  Yes [name], MD  torsemide (DEMADEX) 20 MG tablet Take 2 tabs in AM and 1 tab in PM Patient taking differently: Take 20-40 mg by mouth See admin instructions. Take 2 tablets (40 mg) by mouth every morning and 1 tablet (20 mg) at night 02/04/18  Yes Larey Dresser, MD    Physical Exam:  Constitutional: NAD, calm, comfortable Vitals:   02/28/18 1030  02/28/18 1037 02/28/18 1112 02/28/18 1209  BP:  (!) 129/59 (!) 145/81 121/66  Pulse: (!) 51 (!) 54 (!) 55 (!) 53  Resp: 14 14 16 16   Temp:   97.6 F (36.4 C) 97.9 F (36.6 C)  TempSrc:   Oral Oral  SpO2: 96% 98% 100% 94%  Weight:      Height:       Eyes: PERRL, lids and conjunctivae normal ENMT: Mucous membranes are moist. Posterior pharynx clear of any exudate or lesions.Normal dentition.  Neck: normal, supple, no masses, no thyromegaly Respiratory: clear to auscultation bilaterally, no wheezing, no crackles. Normal respiratory effort. No accessory muscle use.  Cardiovascular: Regular rate and rhythm, no murmurs / rubs / gallops. No extremity edema. 2+ pedal pulses. No carotid bruits.  Abdomen: no tenderness, no masses palpated. No hepatosplenomegaly. Bowel sounds positive.  Musculoskeletal: no clubbing / cyanosis. No joint deformity upper and lower extremities. Good ROM, no contractures. Normal muscle tone.  Skin: no rashes, lesions, ulcers. No induration, pale Neurologic: CN 2-12 grossly intact. Sensation intact, DTR normal. Strength 5/5 in all 4.  Psychiatric: Normal judgment and insight. Alert and oriented x 3. Normal mood.    Labs on Admission: I have personally reviewed following labs and imaging studies  CBC: Recent Labs  Lab 02/26/18 0935 02/26/18 1315 02/27/18 0248 02/27/18 1318 02/27/18 1852 02/28/18 0024 02/28/18 0619  WBC 6.6 6.0 8.0  --   --   --   --   HGB 6.7* 6.8* 8.1* 8.5* 8.3* 8.0* 8.1*  HCT 22.2* 22.2* 25.0* 26.8* 26.8* 25.7* 25.8*  MCV 87.7 87.1 84.2  --   --   --   --   PLT 286 295 235  --   --   --   --    Basic Metabolic Panel: Recent Labs  Lab 02/26/18 0935 02/26/18 1315 02/27/18 0248  NA 133* 131* 134*  K 3.5 3.4* 3.4*  CL 95* 94* 100  CO2 28 27 26   GLUCOSE 130* 136* 89  BUN 20 24* 21  CREATININE 1.35* 1.55* 1.33*  CALCIUM 8.7* 8.5* 8.0*   GFR: Estimated Creatinine Clearance: 45 mL/min (A) (by C-G formula based on SCr of 1.33 mg/dL  (H)). Liver Function Tests: Recent Labs  Lab 02/26/18 1315  AST 23  ALT 19  ALKPHOS 75  BILITOT 0.7  PROT 6.7  ALBUMIN 3.6   No results for input(s): LIPASE, AMYLASE in the last 168 hours.  No results for input(s): AMMONIA in the last 168 hours. Coagulation Profile: Recent Labs  Lab 02/26/18 0935  INR 1.99   Anemia Panel: Recent Labs    02/26/18 1455  VITAMINB12 202  FOLATE 15.0  FERRITIN 46  TIBC 456*  IRON 14*  RETICCTPCT 2.4   Urine analysis:    Component Value Date/Time   COLORURINE YELLOW 02/05/2018 2332   APPEARANCEUR CLEAR 02/05/2018 2332   LABSPEC 1.011 02/05/2018 2332   PHURINE 7.0 02/05/2018 2332   GLUCOSEU NEGATIVE 02/05/2018 2332   HGBUR NEGATIVE 02/05/2018 2332   HGBUR negative 04/05/2009 0802   BILIRUBINUR NEGATIVE 02/05/2018 2332   BILIRUBINUR n 02/03/2011 1315   KETONESUR NEGATIVE 02/05/2018 2332   PROTEINUR NEGATIVE 02/05/2018 2332   UROBILINOGEN 1.0 01/27/2014 1002   NITRITE NEGATIVE 02/05/2018 2332   LEUKOCYTESUR NEGATIVE 02/05/2018 2332    Radiological Exams on Admission: No results found.  EKG: Independently reviewed.  Sinus bradycardia with left bundle branch block unchanged from prior  Assessment/Plan Principal Problem:   Symptomatic anemia Active Problems:   GI bleed   Pyloric stenosis, acquired   Duodenal ulcer, with obstruction   Persistent atrial fibrillation   Chronic systolic heart failure (HCC)   Hypertension   Hyperlipidemia   Symptomatic anemia -Patient with recent hospitalization secondary to symptomatic anemia as well, endoscopy then significant for gastric ulcer with no stigmata of bleeding, thought secondary to NSAID, he may resume his Xarelto in 1 week -Continue to hold Xarelto -Hemoglobin is 8.1 today, up from 6.7, follow on anemia panel, recent work-up significant for iron deficiency anemia.  Patient is receiving IV iron -He received 2 unit PRBC, repeat CBC overnight.  GI bleed due to duodenal  ulcer: -Recent endoscopy significant for gastric ulcer, and new EGD done today shows duodenal ulceration.  For now continue with the Protonix drip, discussed with GI, keep on liquid diet today, advance diet in a.m. restart Xarelto in 1 week  Acquired pyloric stenosis: Noted likely due to duodenal ulcer.  Chronic systolic CHF -Management per cardiology, recommendation to continue torsemide, resumed today at a lower dose in the setting of worsening renal function, and you with beta-blockers per the recommendation  Persistent A. Fib -Continue with amiodarone and beta-blockers, anticoagulation being held secondary to GI bleed  Hyperlipidemia -Statin been held by cardiology and given muscle pain  AKI and CKD stage III -Discontinue losartan, torsemide resumed a day on a lower dose, PRBC transfusion overnight will give some volume and should help as well area today's creatinine is 1.33  Hypertension -Pressure acceptable    DVT Prophylaxis SCDs    Family Communication: Admission, patients condition and plan of care including tests being ordered have been discussed with the patient  who indicate understanding and agree with the plan and Code Status.  Code Status Full  Likely DC to  home  Condition GUARDED    Consults called: GI, labeur,   Admission status: Admitted to the hospital from observation status  Time spent in minutes : 60 minutes    Lady Deutscher MD [facility] Pager [phone]  If 7PM-7AM, please contact night-coverage [email or web address] Password [email or web address]  02/28/2018, 2:47 PM

## 2018-02-28 NOTE — Progress Notes (Signed)
Pt being transported for EGD.

## 2018-02-28 NOTE — Progress Notes (Addendum)
Pt. admitted to [address]N[facility] from [facility] Observation.  Forestine Chute, RN 02/28/2018

## 2018-02-28 NOTE — Op Note (Signed)
[facility] Patient Name: Travis [name] Procedure Date : 02/28/2018 MRN: [id number] Attending MD: Mauri Pole , MD Date of Birth: [birth year] CSN: [id number] Age: 77 Admit Type: Inpatient Procedure:                Upper GI endoscopy Indications:              Recent gastrointestinal bleeding. History duodenal                            ulcer and stricture Providers:                Mauri Pole, MD, Baird Cancer, RN, Charolette Child, Technician, Maia Plan, CRNA Referring MD:              Medicines:                Monitored Anesthesia Care Complications:            No immediate complications. Estimated Blood Loss:     Estimated blood loss was minimal. Procedure:                Pre-Anesthesia Assessment:                           - Prior to the procedure, a History and Physical                            was performed, and patient medications and                            allergies were reviewed. The patient's tolerance of                            previous anesthesia was also reviewed. The risks                            and benefits of the procedure and the sedation                            options and risks were discussed with the patient.                            All questions were answered, and informed consent                            was obtained. Prior Anticoagulants: The patient                            last took Xarelto (rivaroxaban) 2 days prior to the                            procedure. ASA Grade Assessment: III - A patient  with severe systemic disease. After reviewing the                            risks and benefits, the patient was deemed in                            satisfactory condition to undergo the procedure.                           After obtaining informed consent, the endoscope was                            passed under direct vision. Throughout the              procedure, the patient's blood pressure, pulse, and                            oxygen saturations were monitored continuously. The                            GIF-H190 ([id number]) Olympus Adult EGD was introduced                            through the mouth, and advanced to the second part                            of duodenum. The upper GI endoscopy was                            accomplished without difficulty. The patient                            tolerated the procedure well. Scope In: Scope Out: Findings:      The esophagus was normal.      Localized mildly erythematous mucosa without bleeding was found in the       prepyloric region of the stomach.      The cardia and gastric fundus were normal on retroflexion.      The duodenal bulb was normal.      An acquired severe stenosis with ulcerated mucosa , surrounding       inflammatory changes was found in the second portion of the duodenum and       was traversed after down sizing the scope to pediatric endoscope. No       visible vessel or active bleeding. Biopsies were taken with a cold       forceps for histology and exclude malignancy. Impression:               - Normal esophagus.                           - Erythematous mucosa in the prepyloric region of                            the stomach.                           -  Normal duodenal bulb.                           - Acquired duodenal stenosis. Biopsied. Recommendation:           - Clear liquid diet for 1 day, then advance as                            tolerated to mechanical soft diet. Avoid high fiber                            diet and meat.                           - Use Protonix (pantoprazole) 40 mg PO BID.                           - Use sucralfate tablets 1 gram PO QID.                           - No high dose aspirin, ibuprofen, naproxen, or                            other non-steroidal anti-inflammatory drugs.                           - Await pathology  results.                           - Repeat upper endoscopy in 8 weeks for                            surveillance.                           - Resume Xarelto (rivaroxaban) at prior dose in 1                            week. Refer to managing physician for further                            adjustment of therapy.                           - OK to discharge home tomorrow if no further                            bleeding or drop in Hgb                           - Follow up repeat Hgb in 1 week                           - Follow up in GI office in 4-6 weeks Procedure Code(s):        --- Professional ---  76147, Esophagogastroduodenoscopy, flexible,                            transoral; with biopsy, single or multiple Diagnosis Code(s):        --- Professional ---                           K31.89, Other diseases of stomach and duodenum                           K31.5, Obstruction of duodenum                           K92.2, Gastrointestinal hemorrhage, unspecified CPT copyright 2017 American Medical Association. All rights reserved. The codes documented in this report are preliminary and upon coder review may  be revised to meet current compliance requirements. Mauri Pole, MD 02/28/2018 10:14:50 AM This report has been signed electronically. Number of Addenda: 0

## 2018-02-28 NOTE — Transfer of Care (Signed)
Immediate Anesthesia Transfer of Care Note  Patient: Travis [name]  Procedure(s) Performed: ESOPHAGOGASTRODUODENOSCOPY (EGD) (N/A ) BIOPSY  Patient Location: PACU and Endoscopy Unit  Anesthesia Type:MAC  Level of Consciousness: drowsy  Airway & Oxygen Therapy: Patient Spontanous Breathing and Patient connected to nasal cannula oxygen  Post-op Assessment: Report given to RN and Post -op Vital signs reviewed and stable  Post vital signs: Reviewed and stable  Last Vitals:  Vitals Value Taken Time  BP    Temp    Pulse    Resp    SpO2      Last Pain:  Vitals:   02/28/18 1037  TempSrc:   PainSc: 0-No pain         Complications: No apparent anesthesia complications

## 2018-02-28 NOTE — Interval H&P Note (Signed)
History and Physical Interval Note:  02/28/2018 8:47 AM  Travis [name]  has presented today for surgery, with the diagnosis of anemia and duodenal ulcer  The various methods of treatment have been discussed with the patient and family. After consideration of risks, benefits and other options for treatment, the patient has consented to  Procedure(s): ESOPHAGOGASTRODUODENOSCOPY (EGD) (N/A) as a surgical intervention .  The patient's history has been reviewed, patient examined, no change in status, stable for surgery.  I have reviewed the patient's chart and labs.  Questions were answered to the patient's satisfaction.     Sharonna Vinje

## 2018-03-01 DIAGNOSIS — I2510 Atherosclerotic heart disease of native coronary artery without angina pectoris: Secondary | ICD-10-CM | POA: Diagnosis present

## 2018-03-01 DIAGNOSIS — I4819 Other persistent atrial fibrillation: Secondary | ICD-10-CM | POA: Diagnosis present

## 2018-03-01 DIAGNOSIS — Z7901 Long term (current) use of anticoagulants: Secondary | ICD-10-CM | POA: Diagnosis not present

## 2018-03-01 DIAGNOSIS — D62 Acute posthemorrhagic anemia: Secondary | ICD-10-CM | POA: Diagnosis present

## 2018-03-01 DIAGNOSIS — D649 Anemia, unspecified: Secondary | ICD-10-CM | POA: Diagnosis not present

## 2018-03-01 DIAGNOSIS — Z79899 Other long term (current) drug therapy: Secondary | ICD-10-CM | POA: Diagnosis not present

## 2018-03-01 DIAGNOSIS — Z8679 Personal history of other diseases of the circulatory system: Secondary | ICD-10-CM | POA: Diagnosis not present

## 2018-03-01 DIAGNOSIS — K219 Gastro-esophageal reflux disease without esophagitis: Secondary | ICD-10-CM | POA: Diagnosis present

## 2018-03-01 DIAGNOSIS — E785 Hyperlipidemia, unspecified: Secondary | ICD-10-CM | POA: Diagnosis present

## 2018-03-01 DIAGNOSIS — K315 Obstruction of duodenum: Secondary | ICD-10-CM | POA: Diagnosis present

## 2018-03-01 DIAGNOSIS — K264 Chronic or unspecified duodenal ulcer with hemorrhage: Secondary | ICD-10-CM | POA: Diagnosis present

## 2018-03-01 DIAGNOSIS — Z87891 Personal history of nicotine dependence: Secondary | ICD-10-CM | POA: Diagnosis not present

## 2018-03-01 DIAGNOSIS — I130 Hypertensive heart and chronic kidney disease with heart failure and stage 1 through stage 4 chronic kidney disease, or unspecified chronic kidney disease: Secondary | ICD-10-CM | POA: Diagnosis present

## 2018-03-01 DIAGNOSIS — N183 Chronic kidney disease, stage 3 (moderate): Secondary | ICD-10-CM | POA: Diagnosis present

## 2018-03-01 DIAGNOSIS — F419 Anxiety disorder, unspecified: Secondary | ICD-10-CM | POA: Diagnosis present

## 2018-03-01 DIAGNOSIS — I447 Left bundle-branch block, unspecified: Secondary | ICD-10-CM | POA: Diagnosis present

## 2018-03-01 DIAGNOSIS — K269 Duodenal ulcer, unspecified as acute or chronic, without hemorrhage or perforation: Secondary | ICD-10-CM

## 2018-03-01 DIAGNOSIS — K311 Adult hypertrophic pyloric stenosis: Secondary | ICD-10-CM | POA: Diagnosis present

## 2018-03-01 DIAGNOSIS — I428 Other cardiomyopathies: Secondary | ICD-10-CM | POA: Diagnosis present

## 2018-03-01 DIAGNOSIS — I5022 Chronic systolic (congestive) heart failure: Secondary | ICD-10-CM | POA: Diagnosis present

## 2018-03-01 DIAGNOSIS — K259 Gastric ulcer, unspecified as acute or chronic, without hemorrhage or perforation: Secondary | ICD-10-CM | POA: Diagnosis present

## 2018-03-01 DIAGNOSIS — Q231 Congenital insufficiency of aortic valve: Secondary | ICD-10-CM | POA: Diagnosis not present

## 2018-03-01 DIAGNOSIS — K317 Polyp of stomach and duodenum: Secondary | ICD-10-CM | POA: Diagnosis present

## 2018-03-01 DIAGNOSIS — Z96653 Presence of artificial knee joint, bilateral: Secondary | ICD-10-CM | POA: Diagnosis present

## 2018-03-01 DIAGNOSIS — N179 Acute kidney failure, unspecified: Secondary | ICD-10-CM | POA: Diagnosis present

## 2018-03-01 DIAGNOSIS — I080 Rheumatic disorders of both mitral and aortic valves: Secondary | ICD-10-CM | POA: Diagnosis present

## 2018-03-01 LAB — CBC
HCT: 27.2 % — ABNORMAL LOW (ref 39.0–52.0)
Hemoglobin: 8.6 g/dL — ABNORMAL LOW (ref 13.0–17.0)
MCH: 26.9 pg (ref 26.0–34.0)
MCHC: 31.6 g/dL (ref 30.0–36.0)
MCV: 85 fL (ref 78.0–100.0)
Platelets: 262 10*3/uL (ref 150–400)
RBC: 3.2 MIL/uL — ABNORMAL LOW (ref 4.22–5.81)
RDW: 17 % — AB (ref 11.5–15.5)
WBC: 7.8 10*3/uL (ref 4.0–10.5)

## 2018-03-01 MED ORDER — PANTOPRAZOLE SODIUM 40 MG PO TBEC
40.0000 mg | DELAYED_RELEASE_TABLET | Freq: Two times a day (BID) | ORAL | Status: DC
  Administered 2018-03-01: 40 mg via ORAL
  Filled 2018-03-01: qty 1

## 2018-03-01 MED ORDER — RIVAROXABAN 15 MG PO TABS: 15.0000 mg | ORAL_TABLET | Freq: Every day | ORAL | 2 refills | Status: DC

## 2018-03-01 MED ORDER — TEMAZEPAM 15 MG PO CAPS: 15.0000 mg | ORAL_CAPSULE | Freq: Every day | ORAL | Status: DC

## 2018-03-01 MED ORDER — SUCRALFATE 1 G PO TABS: 1.0000 g | ORAL_TABLET | Freq: Three times a day (TID) | ORAL | 1 refills | Status: DC

## 2018-03-01 MED ORDER — SODIUM CHLORIDE 0.9 % IV SOLN
510.0000 mg | INTRAVENOUS | Status: AC
  Administered 2018-03-01: 510 mg via INTRAVENOUS
  Filled 2018-03-01: qty 17

## 2018-03-01 NOTE — Progress Notes (Signed)
Pt ready for discharge to home, will be taken home by friend.  DC instructions given and reviewed and pt understands how to take his meds and all of his follow up appointments.  No further questions about home self care.

## 2018-03-01 NOTE — Discharge Summary (Signed)
Physician Discharge Summary  Travis [name] FIE:[id number] DOB: 1940/07/07 DOA: 02/26/2018  PCP: [name], MD  Admit date: 02/26/2018 Discharge date: 03/02/2018  Admitted From: Home Disposition: Home  Recommendations for Outpatient Follow-up:  1. Follow up with PCP in 1-2 weeks 2. Please obtain BMP/CBC in one week  Home Health: No Equipment/Devices: None  Discharge Condition: Stable CODE STATUS: Code Diet recommendation: Heart healthy  Brief/Interim Summary: Travis [name] is a 77 y.o. male with medical history significant of atrial fibrillation on Xarelto, chronic systolic CHF, hypertension, chronic kidney disease stage III, and anxiety,with recent hospitalization secondary to symptomatic anemia, endoscopy significant for severe diaphragmatic stricture unable to pass adult scope thought secondary to chronic NSAID use., which was held on discharge, he was resumed on Xarelto, patient has been complaining with some fatigue, and had blood work-up done in anticipation of cardiac cath needs soon, his hemoglobin was noted to be 6.7, and creatinine elevated to 1.6, so he was sent for further evaluation.  He was placed in observation received 2 units of packed red blood cells and GI was consulted.  An upper endoscopy was performed on the day prior to discharge and he was found to have mildly erythematous mucosa without bleeding in the prepyloric region of the stomach, and acquired severe stenosis with ulcerated mucosa surrounding inflammatory changes in the second portion of the duodenum which was traversed after downsizing the scope to the pediatric endoscope.  There was no visible vessel or active bleeding.  Clear liquid diet was recommended and advance to mechanical soft diet lowly as tolerated.  Protonix 40 twice daily, sucralfate 1 g p.o. 4 times daily, no high-dose aspirin, ibuprofen, naproxen or other nonsteroidal anti-inflammatory medications.    The patient will require repeat upper  endoscopy in 8 weeks.  He may resume Xarelto at prior dose in 1 week.  Patient was admitted to the hospital due to requiring IV iron after 2 units of packed red blood cells and findings of concerning duodenal lesions.  Pathology is pending.  Patient has reached maximal benefit of hospitalization.  Discharge diagnosis, prognosis, plans, follow-up, medications and treatments discussed with the patient(or responsible party) and is in agreement with the plans as described.  Patient is stable for discharge.  Discharge Diagnoses:  Principal Problem:   Symptomatic anemia Active Problems:   GI bleed   Pyloric stenosis, acquired   Duodenal ulcer, with obstruction   Persistent atrial fibrillation   Chronic systolic heart failure ([place])   Hypertension   Hyperlipidemia   Acute blood loss anemia    Discharge Instructions  Discharge Instructions    Diet - low sodium heart healthy   Complete by:  As directed    Discharge instructions   Complete by:  As directed    DO NOT TAKE XARELTO UNTIL 03/06/2018.  RESUME IT THAT MORNING   Increase activity slowly   Complete by:  As directed      Allergies as of 03/01/2018   No Known Allergies     Medication List    STOP taking these medications   OVER THE COUNTER MEDICATION     TAKE these medications   acetaminophen 500 MG tablet Commonly known as:  TYLENOL Take 1,000 mg by mouth every 6 (six) hours as needed for mild pain or headache.   ALPRAZolam 0.5 MG tablet Commonly known as:  XANAX TAKE 1 TABLET BY MOUTH TWICE DAILY AS NEEDED FOR ANXIETY( SEPARATE BY 8 HOURS FROM TEMAZEPAM) What changed:  See the new  instructions.   amiodarone 200 MG tablet Commonly known as:  PACERONE Take 0.5 tablets (100 mg total) by mouth daily.   amoxicillin 500 MG tablet Commonly known as:  AMOXIL Take 2,000 mg by mouth See admin instructions. Take four capsules (2000 mg) by mouth one hour prior to dental procedure   carvedilol 3.125 MG tablet Commonly  known as:  COREG Take 1 tablet (3.125 mg total) by mouth 2 (two) times daily with a meal.   finasteride 1 MG tablet Commonly known as:  PROPECIA TAKE 1 TABLET(1 MG) BY MOUTH DAILY What changed:  See the new instructions.   GENTEAL 0.25-0.3 % Gel Generic drug:  Carboxymethylcell-Hypromellose Place 1 drop into both eyes at bedtime.   pantoprazole 40 MG tablet Commonly known as:  PROTONIX Take 1 tablet (40 mg total) by mouth 2 (two) times daily.   PRESERVISION AREDS 2 Caps Take 1 capsule by mouth 2 (two) times daily.   Rivaroxaban 15 MG Tabs tablet Commonly known as:  XARELTO Take 1 tablet (15 mg total) by mouth daily with supper. RESTART ON 03/06/2018 What changed:  additional instructions   sucralfate 1 g tablet Commonly known as:  CARAFATE Take 1 tablet (1 g total) by mouth 4 (four) times daily -  with meals and at bedtime.   temazepam 15 MG capsule Commonly known as:  RESTORIL TAKE ONE CAPSULE BY MOUTH AT BEDTIME AS NEEDED What changed:  when to take this   torsemide 20 MG tablet Commonly known as:  DEMADEX Take 2 tabs in AM and 1 tab in PM What changed:    how much to take  how to take this  when to take this  additional instructions      Follow-up Information    [name], MD Follow up on 03/04/2018.   Specialty:  Family Medicine Why:  FOR BLOOD COUNT AND FOLLOW [facility] Contact information: [place] [address] [phone]        Larey Dresser, MD .   Specialty:  Cardiology Contact information: [address] [place] [address] [phone]        Milus Banister, MD. Schedule an appointment as soon as possible for a visit in 2 week(s).   Specialty:  Gastroenterology Why:  Call office and reschedule appointment to be seen for followup of ulcer and bleeding Contact information: [address] [place] [address] [phone]          No Known  Allergies  Consultations:  Blood [facility] Dr. Silverio Decamp   Procedures/Studies: EGD results as noted above  Subjective: Patient is feeling better no acute complaints.  He is anxious for discharge home.  Discharge Exam: Vitals:   02/28/18 2222 03/01/18 0612  BP: 136/69 123/65  Pulse: 64 60  Resp: 18 18  Temp: 97.8 F (36.6 C) 97.7 F (36.5 C)  SpO2: 95% 94%   Vitals:   02/28/18 1801 02/28/18 2219 02/28/18 2222 03/01/18 0612  BP: (!) 103/55 136/74 136/69 123/65  Pulse: (!) 57 65 64 60  Resp: 16 17 18 18   Temp: 97.8 F (36.6 C) (!) 97.3 F (36.3 C) 97.8 F (36.6 C) 97.7 F (36.5 C)  TempSrc: Oral Oral Oral Oral  SpO2: 100% 96% 95% 94%  Weight:   75.1 kg   Height:   5\' 8"  (1.727 m)     General: Pt is alert, awake, not in acute distress Cardiovascular: RRR, S1/S2 +, no rubs,  no gallops Respiratory: CTA bilaterally, no wheezing, no rhonchi Abdominal: Soft, NT, ND, bowel sounds + Extremities: no edema, no cyanosis    The results of significant diagnostics from this hospitalization (including imaging, microbiology, ancillary and laboratory) are listed below for reference.     Microbiology: No results found for this or any previous visit (from the past 240 hour(s)).   Labs: BNP (last 3 results) Recent Labs    08/12/17 1531 09/17/17 1313 10/10/17 0912  BNP 649.3* 1,399.3* 6,213.0*   Basic Metabolic Panel: Recent Labs  Lab 02/26/18 0935 02/26/18 1315 02/27/18 0248  NA 133* 131* 134*  K 3.5 3.4* 3.4*  CL 95* 94* 100  CO2 28 27 26   GLUCOSE 130* 136* 89  BUN 20 24* 21  CREATININE 1.35* 1.55* 1.33*  CALCIUM 8.7* 8.5* 8.0*   Liver Function Tests: Recent Labs  Lab 02/26/18 1315  AST 23  ALT 19  ALKPHOS 75  BILITOT 0.7  PROT 6.7  ALBUMIN 3.6   No results for input(s): LIPASE, AMYLASE in the last 168 hours. No results for input(s): AMMONIA in the last 168 hours. CBC: Recent Labs  Lab 02/26/18 0935 02/26/18 1315 02/27/18 0248 02/27/18 1318  02/27/18 1852 02/28/18 0024 02/28/18 0619 03/01/18 0555  WBC 6.6 6.0 8.0  --   --   --   --  7.8  HGB 6.7* 6.8* 8.1* 8.5* 8.3* 8.0* 8.1* 8.6*  HCT 22.2* 22.2* 25.0* 26.8* 26.8* 25.7* 25.8* 27.2*  MCV 87.7 87.1 84.2  --   --   --   --  85.0  PLT 286 295 235  --   --   --   --  262   Cardiac Enzymes: No results for input(s): CKTOTAL, CKMB, CKMBINDEX, TROPONINI in the last 168 hours. BNP: Invalid input(s): POCBNP CBG: No results for input(s): GLUCAP in the last 168 hours. D-Dimer No results for input(s): DDIMER in the last 72 hours. Hgb A1c No results for input(s): HGBA1C in the last 72 hours. Lipid Profile No results for input(s): CHOL, HDL, LDLCALC, TRIG, CHOLHDL, LDLDIRECT in the last 72 hours. Thyroid function studies No results for input(s): TSH, T4TOTAL, T3FREE, THYROIDAB in the last 72 hours.  Invalid input(s): FREET3 Anemia work up No results for input(s): VITAMINB12, FOLATE, FERRITIN, TIBC, IRON, RETICCTPCT in the last 72 hours. Urinalysis    Component Value Date/Time   COLORURINE YELLOW 02/05/2018 2332   APPEARANCEUR CLEAR 02/05/2018 2332   LABSPEC 1.011 02/05/2018 2332   PHURINE 7.0 02/05/2018 2332   GLUCOSEU NEGATIVE 02/05/2018 2332   HGBUR NEGATIVE 02/05/2018 2332   HGBUR negative 04/05/2009 0802   BILIRUBINUR NEGATIVE 02/05/2018 2332   BILIRUBINUR n 02/03/2011 1315   KETONESUR NEGATIVE 02/05/2018 2332   PROTEINUR NEGATIVE 02/05/2018 2332   UROBILINOGEN 1.0 01/27/2014 1002   NITRITE NEGATIVE 02/05/2018 2332   LEUKOCYTESUR NEGATIVE 02/05/2018 2332   Sepsis Labs Invalid input(s): PROCALCITONIN,  WBC,  LACTICIDVEN Microbiology No results found for this or any previous visit (from the past 240 hour(s)).   Time coordinating discharge: 39 minutes  SIGNED:   Lady Deutscher, MD  FACP [facility] 03/02/2018, 4:38 PM Pager   If 7PM-7AM, please contact night-coverage [email or web address] Password [email or web address]

## 2018-03-01 NOTE — Progress Notes (Signed)
Daily Rounding Note  03/01/2018, 8:18 AM  LOS: 0 days   SUBJECTIVE:   Chief complaint: weakness, fatigue: resolved  Feels very well.  Tolerating solids.    OBJECTIVE:         Vital signs in last 24 hours:    Temp:  [97.3 F (36.3 C)-98.3 F (36.8 C)] 97.7 F (36.5 C) (10/07 0612) Pulse Rate:  [49-65] 60 (10/07 0612) Resp:  [14-22] 18 (10/07 0612) BP: (86-145)/(42-81) 123/65 (10/07 0612) SpO2:  [94 %-100 %] 94 % (10/07 0612) Weight:  [75.1 kg] 75.1 kg (10/06 2222) Last BM Date: 02/26/18 Filed Weights   02/26/18 1247 02/28/18 2222  Weight: 74.8 kg 75.1 kg   General: looks well.  Alert, vomfortable   Heart: irreg, irreg, rate controlled Chest: clear bil.  No cough or SOB Abdomen: soft, NT, ND.  Active BS  Extremities: no CCE Neuro/Psych:  Oriented x 3.  Calm.  No gross deficits.  Good historian.    Intake/Output from previous day: 10/06 0701 - 10/07 0700 In: 1297.7 [P.O.:590; I.V.:707.7] Out: -   Intake/Output this shift: No intake/output data recorded.  Lab Results: Recent Labs    02/26/18 1315 02/27/18 0248  02/28/18 0024 02/28/18 0619 03/01/18 0555  WBC 6.0 8.0  --   --   --  7.8  HGB 6.8* 8.1*   < > 8.0* 8.1* 8.6*  HCT 22.2* 25.0*   < > 25.7* 25.8* 27.2*  PLT 295 235  --   --   --  262   < > = values in this interval not displayed.   BMET Recent Labs    02/26/18 0935 02/26/18 1315 02/27/18 0248  NA 133* 131* 134*  K 3.5 3.4* 3.4*  CL 95* 94* 100  CO2 28 27 26   GLUCOSE 130* 136* 89  BUN 20 24* 21  CREATININE 1.35* 1.55* 1.33*  CALCIUM 8.7* 8.5* 8.0*   LFT Recent Labs    02/26/18 1315  PROT 6.7  ALBUMIN 3.6  AST 23  ALT 19  ALKPHOS 75  BILITOT 0.7   PT/INR Recent Labs    02/26/18 0935  LABPROT 22.4*  INR 1.99   Scheduled Meds: . amiodarone  100 mg Oral Daily  . carvedilol  3.125 mg Oral BID WC  . multivitamin  1 tablet Oral BID  . [START ON 03/02/2018] pantoprazole   40 mg Intravenous Q12H  . polyvinyl alcohol  1 drop Both Eyes QHS  . sucralfate  1 g Oral TID WC & HS  . torsemide  20 mg Oral BID   Continuous Infusions: . ferumoxytol 510 mg (02/27/18 1338)  . pantoprozole (PROTONIX) infusion 8 mg/hr (03/01/18 0300)   PRN Meds:.acetaminophen, albuterol, ALPRAZolam   ASSESMENT:   *  FOBT + anemia.  Gastric ulcer 2007, Duodenal ulcer (path benign, no HP) 01/2018 due to NSAIDs.  On PPI since.   S/p PRBC x 2.  Feraheme IV 10/5.    10/6 EGD: pre-pyloric gastritis and duodenal stenosis.   Hgb stable.    *   A fib, chronic  Xarelto.     PLAN   *   Interval EGD in 2 months to assess state of stenosis, gastritis.    *   Resume Xarelto 10/15.  EGD 11/19 with Dr Ardis Hughs in [place].  11/5 RN preop visit No NSAIDs,  Switch to oral PPI BID.  Sucralfate 1 gm AC/HS.      Azucena Freed  03/01/2018,  8:18 AM Phone [phone]

## 2018-03-01 NOTE — Progress Notes (Signed)
Spoke with Pharmacy earlier today about iron infusion and we will give it according to order today (last dose was 10/5).Infusing at present.

## 2018-03-02 ENCOUNTER — Telehealth: Payer: Self-pay | Admitting: *Deleted

## 2018-03-02 NOTE — Telephone Encounter (Signed)
Noted  

## 2018-03-02 NOTE — Telephone Encounter (Signed)
Hospital discharge summary not complete.  Per telephone call: Transition Care Management Follow-up Telephone Call   Date discharged? 03/01/18   How have you been since you were released from the hospital? Good   Do you understand why you were in the hospital? yes   Do you understand the discharge instructions? yes   Where were you discharged to? Home   Items Reviewed:  Medications reviewed: yes  Allergies reviewed: yes  Dietary changes reviewed: yes  Referrals reviewed: yes   Functional Questionnaire:   Activities of Daily Living (ADLs):   He states they are independent in the following: ambulation, bathing and hygiene, feeding, continence, grooming, toileting and dressing States they require assistance with the following: none   Any transportation issues/concerns?: no   Any patient concerns? no   Confirmed importance and date/time of follow-up visits scheduled yes  Provider Appointment booked with Len Blalock, [place] 03/04/18 11:00.  Confirmed with patient if condition begins to worsen call PCP or go to the ER.  Patient was given the office number and encouraged to call back with question or concerns.  : yes

## 2018-03-03 ENCOUNTER — Encounter (HOSPITAL_COMMUNITY): Payer: Self-pay

## 2018-03-03 ENCOUNTER — Ambulatory Visit (HOSPITAL_COMMUNITY): Admit: 2018-03-03 | Payer: Medicare Other | Admitting: Cardiology

## 2018-03-03 SURGERY — RIGHT/LEFT HEART CATH AND CORONARY ANGIOGRAPHY
Anesthesia: LOCAL

## 2018-03-04 ENCOUNTER — Ambulatory Visit (INDEPENDENT_AMBULATORY_CARE_PROVIDER_SITE_OTHER): Payer: Medicare Other | Admitting: Physician Assistant

## 2018-03-04 ENCOUNTER — Encounter (HOSPITAL_COMMUNITY): Payer: Self-pay | Admitting: Gastroenterology

## 2018-03-04 VITALS — BP 112/60 | HR 50 | Temp 97.7°F | Ht 68.0 in | Wt 165.2 lb

## 2018-03-04 DIAGNOSIS — K922 Gastrointestinal hemorrhage, unspecified: Secondary | ICD-10-CM

## 2018-03-04 LAB — CBC WITH DIFFERENTIAL/PLATELET
Basophils Absolute: 0.1 10*3/uL (ref 0.0–0.1)
Basophils Relative: 0.8 % (ref 0.0–3.0)
Eosinophils Absolute: 0.2 10*3/uL (ref 0.0–0.7)
Eosinophils Relative: 3.4 % (ref 0.0–5.0)
HEMATOCRIT: 29.3 % — AB (ref 39.0–52.0)
HEMOGLOBIN: 9.6 g/dL — AB (ref 13.0–17.0)
LYMPHS PCT: 24.4 % (ref 12.0–46.0)
Lymphs Abs: 1.7 10*3/uL (ref 0.7–4.0)
MCHC: 32.9 g/dL (ref 30.0–36.0)
MCV: 85.8 fl (ref 78.0–100.0)
MONOS PCT: 9.3 % (ref 3.0–12.0)
Monocytes Absolute: 0.6 10*3/uL (ref 0.1–1.0)
NEUTROS ABS: 4.2 10*3/uL (ref 1.4–7.7)
Neutrophils Relative %: 62.1 % (ref 43.0–77.0)
PLATELETS: 315 10*3/uL (ref 150.0–400.0)
RBC: 3.41 Mil/uL — ABNORMAL LOW (ref 4.22–5.81)
RDW: 19.2 % — AB (ref 11.5–15.5)
WBC: 6.8 10*3/uL (ref 4.0–10.5)

## 2018-03-04 LAB — BASIC METABOLIC PANEL
BUN: 15 mg/dL (ref 6–23)
CHLORIDE: 95 meq/L — AB (ref 96–112)
CO2: 32 mEq/L (ref 19–32)
CREATININE: 1.3 mg/dL (ref 0.40–1.50)
Calcium: 9.4 mg/dL (ref 8.4–10.5)
GFR: 56.78 mL/min — AB (ref >60.00)
Glucose, Bld: 110 mg/dL — ABNORMAL HIGH (ref 70–99)
Potassium: 3.6 mEq/L (ref 3.5–5.1)
Sodium: 133 mEq/L — ABNORMAL LOW (ref 135–145)

## 2018-03-04 NOTE — Progress Notes (Signed)
Travis [name] is a 77 y.o. male is here for hospital follow up.  I acted as a Education administrator for [employer], PA-C Anselmo Pickler, LPN  History of Present Illness:   Chief Complaint  Patient presents with  . Hospitalization Follow-up    HPI   He presents for transitional care visit. Discharged on 03/01/18 and TCM call made on 03/02/18. Moderate complexity. I independently reviewed his labs and EGD during his visit.  Significant PMHx of a fib (on xarelto), CHF, HTN, CKD stage III and anxiety. He was admitted on 02/26/18 and discharged 03/02/18 for symptomatic anemia. Hemoglobin was noted to be 6.7.  Upper endoscopy on 02/28/18 showed erythematous mucosa and acquired severe duodenal stenosis. Biopsies were taken, they are still pending at this point. He was taken off of his Xarelto and was to resume his Xarelto at normal dose in 1 week from endoscopy - which will be this Sunday, Oct 13. Was discharged on Protonix 40 mg BID and Sucralfate 1 g PO QID with recommendations to avoid all ASA, NSAIDs.  Pt here for follow up from hospital Admission 02/26/2018 for Anemia and GI bleed. He denies any: shortness of breath, overt rectal bleeding, dizziness, lightheadedness, abdominal pain.  There are no preventive care reminders to display for this patient.  Past Medical History:  Diagnosis Date  . Arthritis   . Biatrial enlargement    severe  . Bicuspid aortic valve   . CHF (congestive heart failure) ([place])    JULY 2014  . Chronic renal insufficiency   . GERD (gastroesophageal reflux disease)   . History of cardioversion 12/16/2012  . History of stomach ulcers   . Hypertension   . Left bundle branch block   . Nonischemic cardiomyopathy ([place])       . Persistent atrial fibrillation    multiple prior cardioversion  . Sinus bradycardia   . Status post clamping of cerebral aneurysm 79     Social History   Socioeconomic History  . Marital status: Single    Spouse name: Not on file  . Number of  children: 0  . Years of education: Not on file  . Highest education level: Not on file  Occupational History  . Occupation: Retired  Scientific laboratory technician  . Financial resource strain: Not on file  . Food insecurity:    Worry: Not on file    Inability: Not on file  . Transportation needs:    Medical: Not on file    Non-medical: Not on file  Tobacco Use  . Smoking status: Former Smoker    Packs/day: 1.50    Years: 25.00    Pack years: 37.50    Last attempt to quit: 05/26/1986    Years since quitting: 31.7  . Smokeless tobacco: Never Used  Substance and Sexual Activity  . Alcohol use: Yes    Alcohol/week: 1.0 standard drinks    Types: 1 Standard drinks or equivalent per week    Comment: stopped drinking   . Drug use: No  . Sexual activity: Not Currently  Lifestyle  . Physical activity:    Days per week: Not on file    Minutes per session: Not on file  . Stress: Not on file  Relationships  . Social connections:    Talks on phone: Not on file    Gets together: Not on file    Attends religious service: Not on file    Active member of club or organization: Not on file  Attends meetings of clubs or organizations: Not on file    Relationship status: Not on file  . Intimate partner violence:    Fear of current or ex partner: Not on file    Emotionally abused: Not on file    Physically abused: Not on file    Forced sexual activity: Not on file  Other Topics Concern  . Not on file  Social History Narrative   Homosexual. Lives alone. Not sexually active.       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 ATRIAL FIBRILLATION ABLATION N/A 10/06/2017   Procedure: ATRIAL FIBRILLATION ABLATION;  Surgeon: [name], MD;  Location: [place] [facility];  Service: Cardiovascular;  Laterality: N/A;  . BIOPSY  02/06/2018   Procedure: BIOPSY;   Surgeon: Lavena Bullion, DO;  Location: [facility];  Service: Gastroenterology;;  . BIOPSY  02/28/2018   Procedure: BIOPSY;  Surgeon: Mauri Pole, MD;  Location: [place];  Service: Endoscopy;;  . CARDIAC CATHETERIZATION    . CARDIOVERSION N/A 12/16/2012   Procedure: CARDIOVERSION;  Surgeon: Lelon Perla, MD;  Location: [facility] ENDOSCOPY;  Service: Cardiovascular;  Laterality: N/A;  . CARDIOVERSION N/A 08/14/2017   Procedure: CARDIOVERSION;  Surgeon: Josue Hector, MD;  Location: [facility] ENDOSCOPY;  Service: Cardiovascular;  Laterality: N/A;  . CARDIOVERSION N/A 09/21/2017   Procedure: CARDIOVERSION;  Surgeon: Sanda Klein, MD;  Location: [place] [facility]OSCOPY;  Service: Cardiovascular;  Laterality: N/A;  . CARDIOVERSION N/A 10/13/2017   Procedure: CARDIOVERSION;  Surgeon: Josue Hector, MD;  Location: [place];  Service: Cardiovascular;  Laterality: N/A;  . CATARACT EXTRACTION  05/2015   bilateral  . cerebral anuersym post clips    . ESOPHAGOGASTRODUODENOSCOPY N/A 02/06/2018   Procedure: ESOPHAGOGASTRODUODENOSCOPY (EGD);  Surgeon: Lavena Bullion, DO;  Location: [facility] ENDOSCOPY;  Service: Gastroenterology;  Laterality: N/A;  . ESOPHAGOGASTRODUODENOSCOPY N/A 02/28/2018   Procedure: ESOPHAGOGASTRODUODENOSCOPY (EGD);  Surgeon: Mauri Pole, MD;  Location: [facility] ENDOSCOPY;  Service: Endoscopy;  Laterality: N/A;  . fractured left arm    . HERNIA REPAIR     lft  . INGUINAL HERNIA REPAIR  07/02/2011   Procedure: LAPAROSCOPIC INGUINAL HERNIA;  Surgeon: Harl Bowie, MD;  Location: [place];  Service: General;  Laterality: Left;  Laparoscopic left inguinal hernia repair and mesh  . left tendon repair     lft foot  . ROTATOR CUFF REPAIR     lf  . TEE WITHOUT CARDIOVERSION N/A 08/14/2017   Procedure: TRANSESOPHAGEAL ECHOCARDIOGRAM (TEE);  Surgeon: Josue Hector, MD;  Location: [facility] ENDOSCOPY;  Service: Cardiovascular;  Laterality: N/A;  . TONSILLECTOMY    . TOTAL KNEE ARTHROPLASTY  Bilateral 02/06/2014   Procedure: TOTAL KNEE BILATERAL;  Surgeon: Mauri Pole, MD;  Location: [facility];  Service: Orthopedics;  Laterality: Bilateral;  . WRIST GANGLION EXCISION     lft    Family History  Problem Relation Age of Onset  . Stroke Father 39       smoker  . Lung cancer Mother 53       former smoker  . Stroke Mother   . Stroke Brother   . Colon cancer Neg Hx     PMHx, SurgHx, SocialHx, FamHx, Medications, and Allergies were reviewed in the Visit Navigator and updated as appropriate.   Patient Active Problem List   Diagnosis  Date Noted  . Acute blood loss anemia 03/01/2018  . Pyloric stenosis, acquired 02/28/2018  . Duodenal ulcer, with obstruction 02/28/2018  . GI bleed 02/06/2018  . Chronic systolic heart failure ([place]) 10/10/2017  . Persistent atrial fibrillation 10/06/2017  . Symptomatic anemia 08/01/2015  . Hyperlipidemia 12/19/2014  . Male pattern baldness 03/06/2014  . Anxiety state 03/06/2014  . Insomnia 03/06/2014  . Overweight (BMI 25.0-29.9) 02/08/2014  . S/P TKR (total knee replacement) 02/08/2014  . Sinus bradycardia 01/04/2013  . Pulmonary hypertension ([place]) 03/09/2012  . Hyponatremia 02/21/2012  . Bicuspid aortic valve   . Hypertension   . Cardiomyopathy, rate related with resolution now recurrent 12/08/2011  . Left bundle branch block 12/10/2010  . DRY MOUTH 11/07/2009  . Carotid bruit 07/05/2009  . GERD 01/07/2008  . Osteoarthritis 02/04/2007  . GANGLION CYST, WRIST, LEFT 02/04/2007    Social History   Tobacco Use  . Smoking status: Former Smoker    Packs/day: 1.50    Years: 25.00    Pack years: 37.50    Last attempt to quit: 05/26/1986    Years since quitting: 31.7  . Smokeless tobacco: Never Used  Substance Use Topics  . Alcohol use: Yes    Alcohol/week: 1.0 standard drinks    Types: 1 Standard drinks or equivalent per week    Comment: stopped drinking   . Drug use: No    Current Medications and Allergies:    Current  Outpatient Medications:  .  acetaminophen (TYLENOL) 500 MG tablet, Take 1,000 mg by mouth every 6 (six) hours as needed for mild pain or headache. , Disp: , Rfl:  .  ALPRAZolam (XANAX) 0.5 MG tablet, TAKE 1 TABLET BY MOUTH TWICE DAILY AS NEEDED FOR ANXIETY( SEPARATE BY 8 HOURS FROM TEMAZEPAM) (Patient taking differently: Take 0.5 mg by mouth daily as needed for anxiety. ), Disp: 5 tablet, Rfl: 0 .  amiodarone (PACERONE) 200 MG tablet, Take 0.5 tablets (100 mg total) by mouth daily., Disp: 90 tablet, Rfl: 2 .  amoxicillin (AMOXIL) 500 MG tablet, Take 2,000 mg by mouth See admin instructions. Take four capsules (2000 mg) by mouth one hour prior to dental procedure, Disp: , Rfl: 4 .  Carboxymethylcell-Hypromellose (GENTEAL) 0.25-0.3 % GEL, Place 1 drop into both eyes at bedtime., Disp: , Rfl:  .  carvedilol (COREG) 3.125 MG tablet, Take 1 tablet (3.125 mg total) by mouth 2 (two) times daily with a meal., Disp: 180 tablet, Rfl: 3 .  finasteride (PROPECIA) 1 MG tablet, TAKE 1 TABLET(1 MG) BY MOUTH DAILY (Patient taking differently: Take 1 mg by mouth daily. ), Disp: 30 tablet, Rfl: 5 .  Multiple Vitamins-Minerals (PRESERVISION AREDS 2) CAPS, Take 1 capsule by mouth 2 (two) times daily., Disp: , Rfl:  .  pantoprazole (PROTONIX) 40 MG tablet, Take 1 tablet (40 mg total) by mouth 2 (two) times daily., Disp: 60 tablet, Rfl: 2 .  Rivaroxaban (XARELTO) 15 MG TABS tablet, Take 1 tablet (15 mg total) by mouth daily with supper. RESTART ON 03/06/2018, Disp: 60 tablet, Rfl: 2 .  sucralfate (CARAFATE) 1 g tablet, Take 1 tablet (1 g total) by mouth 4 (four) times daily -  with meals and at bedtime., Disp: 120 tablet, Rfl: 1 .  temazepam (RESTORIL) 15 MG capsule, TAKE ONE CAPSULE BY MOUTH AT BEDTIME AS NEEDED (Patient taking differently: Take 15 mg by mouth at bedtime. ), Disp: 30 capsule, Rfl: 5 .  torsemide (DEMADEX) 20 MG tablet, Take 2 tabs in AM and 1  tab in PM (Patient taking differently: Take 20-40 mg by mouth See  admin instructions. Take 2 tablets (40 mg) by mouth every morning and 1 tablet (20 mg) at night), Disp: 90 tablet, Rfl: 2  No Known Allergies  Review of Systems   ROS  Negative unless otherwise specified per HPI.  Vitals:   Vitals:   03/04/18 1114  BP: 112/60  Pulse: (!) 50  Temp: 97.7 F (36.5 C)  TempSrc: Oral  SpO2: 95%  Weight: 165 lb 4 oz (75 kg)  Height: 5\' 8"  (1.727 m)     Body mass index is 25.13 kg/m.   Physical Exam:    Physical Exam  Constitutional: He appears well-developed. He is cooperative.  Non-toxic appearance. He does not have a sickly appearance. He does not appear ill. No distress.  Cardiovascular: Normal rate, regular rhythm, S1 normal, S2 normal, normal heart sounds and normal pulses.  No LE edema  Pulmonary/Chest: Effort normal and breath sounds normal.  Neurological: He is alert. GCS eye subscore is 4. GCS verbal subscore is 5. GCS motor subscore is 6.  Skin: Skin is warm, dry and intact.  Psychiatric: He has a normal mood and affect. His speech is normal and behavior is normal.  Nursing note and vitals reviewed.    Assessment and Plan:    Travis [name] was seen today for hospitalization follow-up.  Diagnoses and all orders for this visit:  Gastrointestinal hemorrhage, unspecified gastrointestinal hemorrhage type -     CBC with Differential/Platelet -     Basic metabolic panel   Currently doing well, denies any complaints. He states that he has an appointment to see GI for repeat endoscopy. He states that he is resuming his Xarelto Friday, as he was advised in the hospital. Repeat CBC and BMP, follow-up in   . Reviewed expectations re: course of current medical issues. . Discussed self-management of symptoms. . Outlined signs and symptoms indicating need for more acute intervention. . Patient verbalized understanding and all questions were answered. . See orders for this visit as documented in the electronic medical record. . Patient received  an After Visit Summary.  CMA or LPN served as scribe during this visit. History, Physical, and Plan performed by medical provider. The above documentation has been reviewed and is accurate and complete.  Inda Coke, PA-C [place] [place] 03/04/2018  Follow-up: No follow-ups on file.

## 2018-03-04 NOTE — Patient Instructions (Signed)
It was great to see you!  Please be sure to follow-up with GI as they have recommended. We will be in touch with your lab work.  Resume Xarelto as we discussed.  Take care,  Inda Coke PA-C

## 2018-03-04 NOTE — Anesthesia Postprocedure Evaluation (Signed)
Anesthesia Post Note  Patient: Travis [name]  Procedure(s) Performed: ESOPHAGOGASTRODUODENOSCOPY (EGD) (N/A ) BIOPSY     Patient location during evaluation: Endoscopy Anesthesia Type: MAC Level of consciousness: awake and alert Pain management: pain level controlled Vital Signs Assessment: post-procedure vital signs reviewed and stable Respiratory status: spontaneous breathing, nonlabored ventilation, respiratory function stable and patient connected to nasal cannula oxygen Cardiovascular status: stable and blood pressure returned to baseline Postop Assessment: no apparent nausea or vomiting Anesthetic complications: no    Last Vitals:  Vitals:   02/28/18 2222 03/01/18 0612  BP: 136/69 123/65  Pulse: 64 60  Resp: 18 18  Temp: 36.6 C 36.5 C  SpO2: 95% 94%    Last Pain:  Vitals:   03/01/18 0612  TempSrc: Oral  PainSc:                  Oneida Mckamey

## 2018-03-05 ENCOUNTER — Other Ambulatory Visit: Payer: Self-pay

## 2018-03-05 DIAGNOSIS — D649 Anemia, unspecified: Secondary | ICD-10-CM

## 2018-03-08 ENCOUNTER — Other Ambulatory Visit: Payer: Self-pay | Admitting: Cardiology

## 2018-03-08 NOTE — Telephone Encounter (Signed)
Pt last saw Dr Aundra Dubin 02/26/18, labs drawn on 02/26/18 Creat 1.55, age 77, weight 76.9kg, CrCl 43.41, based on CrCl pt is on appropriate dosage of Xarelto 15mg  QD.  Will refill rx.

## 2018-03-18 ENCOUNTER — Other Ambulatory Visit: Payer: Self-pay | Admitting: Family Medicine

## 2018-03-19 ENCOUNTER — Other Ambulatory Visit (INDEPENDENT_AMBULATORY_CARE_PROVIDER_SITE_OTHER): Payer: Medicare Other

## 2018-03-19 DIAGNOSIS — D649 Anemia, unspecified: Secondary | ICD-10-CM

## 2018-03-19 LAB — CBC
HCT: 35.8 % — ABNORMAL LOW (ref 39.0–52.0)
HEMOGLOBIN: 11.8 g/dL — AB (ref 13.0–17.0)
MCHC: 32.8 g/dL (ref 30.0–36.0)
MCV: 91.8 fl (ref 78.0–100.0)
PLATELETS: 254 10*3/uL (ref 150.0–400.0)
RBC: 3.9 Mil/uL — ABNORMAL LOW (ref 4.22–5.81)
RDW: 26.9 % — ABNORMAL HIGH (ref 11.5–15.5)
WBC: 5.5 10*3/uL (ref 4.0–10.5)

## 2018-03-27 ENCOUNTER — Other Ambulatory Visit: Payer: Self-pay | Admitting: Nurse Practitioner

## 2018-03-30 ENCOUNTER — Ambulatory Visit (HOSPITAL_COMMUNITY)
Admission: RE | Admit: 2018-03-30 | Discharge: 2018-03-30 | Disposition: A | Discharge status: Home / Self Care | Payer: Medicare Other | Source: Ambulatory Visit | Attending: Cardiology | Admitting: Cardiology

## 2018-03-30 ENCOUNTER — Encounter (HOSPITAL_COMMUNITY): Payer: Self-pay | Admitting: Cardiology

## 2018-03-30 VITALS — BP 118/70 | HR 50 | Wt 169.6 lb

## 2018-03-30 DIAGNOSIS — I2510 Atherosclerotic heart disease of native coronary artery without angina pectoris: Secondary | ICD-10-CM | POA: Diagnosis not present

## 2018-03-30 DIAGNOSIS — Z87891 Personal history of nicotine dependence: Secondary | ICD-10-CM | POA: Diagnosis not present

## 2018-03-30 DIAGNOSIS — I350 Nonrheumatic aortic (valve) stenosis: Secondary | ICD-10-CM | POA: Insufficient documentation

## 2018-03-30 DIAGNOSIS — R9431 Abnormal electrocardiogram [ECG] [EKG]: Secondary | ICD-10-CM | POA: Diagnosis not present

## 2018-03-30 DIAGNOSIS — Q231 Congenital insufficiency of aortic valve: Secondary | ICD-10-CM | POA: Diagnosis not present

## 2018-03-30 DIAGNOSIS — D649 Anemia, unspecified: Secondary | ICD-10-CM | POA: Insufficient documentation

## 2018-03-30 DIAGNOSIS — I130 Hypertensive heart and chronic kidney disease with heart failure and stage 1 through stage 4 chronic kidney disease, or unspecified chronic kidney disease: Secondary | ICD-10-CM | POA: Insufficient documentation

## 2018-03-30 DIAGNOSIS — Z9889 Other specified postprocedural states: Secondary | ICD-10-CM | POA: Insufficient documentation

## 2018-03-30 DIAGNOSIS — I5022 Chronic systolic (congestive) heart failure: Secondary | ICD-10-CM | POA: Diagnosis not present

## 2018-03-30 DIAGNOSIS — E785 Hyperlipidemia, unspecified: Secondary | ICD-10-CM | POA: Insufficient documentation

## 2018-03-30 DIAGNOSIS — I340 Nonrheumatic mitral (valve) insufficiency: Secondary | ICD-10-CM | POA: Insufficient documentation

## 2018-03-30 DIAGNOSIS — Z79899 Other long term (current) drug therapy: Secondary | ICD-10-CM | POA: Insufficient documentation

## 2018-03-30 DIAGNOSIS — Z8711 Personal history of peptic ulcer disease: Secondary | ICD-10-CM | POA: Insufficient documentation

## 2018-03-30 DIAGNOSIS — Z7901 Long term (current) use of anticoagulants: Secondary | ICD-10-CM | POA: Diagnosis not present

## 2018-03-30 DIAGNOSIS — N183 Chronic kidney disease, stage 3 (moderate): Secondary | ICD-10-CM | POA: Insufficient documentation

## 2018-03-30 DIAGNOSIS — I447 Left bundle-branch block, unspecified: Secondary | ICD-10-CM | POA: Diagnosis not present

## 2018-03-30 DIAGNOSIS — I480 Paroxysmal atrial fibrillation: Secondary | ICD-10-CM | POA: Diagnosis not present

## 2018-03-30 MED ORDER — LOSARTAN POTASSIUM 25 MG PO TABS: 12.5000 mg | ORAL_TABLET | Freq: Every day | ORAL | 3 refills | Status: DC

## 2018-03-30 MED ORDER — AMIODARONE HCL 100 MG PO TABS: 100.0000 mg | ORAL_TABLET | Freq: Every day | ORAL | Status: DC

## 2018-03-30 NOTE — Patient Instructions (Signed)
Refilled amiodarone with 100mg  tabs. 1 tab daily  START losartan 12.5 mg (0.5 tab) daily  You have been referred to the [facility]id Clinic. They will contact you to schedule an appointment.  Labs need to be done in 10-14 days.  Follow up with [name] in 3 months

## 2018-03-30 NOTE — Progress Notes (Signed)
Advanced Heart Failure Clinic Note   PCP: [name], MD PCP-Cardiologist: [name], MD  HF: [name]   HPI:  Travis [name] is a 77 y.o. male with h/o persistent atrial fibrillation s/p multiple failed DCCV, failed Tikosyn, ERAF after atrial fibrillation ablation, bicuspid AV with moderate AS and Mild AI, HTN, HLD, and chronic systolic CHF (EF 33-82%) with presumed tachy-mediated CMP.   Pt failed DCCV 08/14/17, 08/31/17, and 09/17/17. Failed Tikosyn. Did have conversion with Amiodarone and DCCV. Pt underwent successful ablation 10/07/17.  However, he had ERAF.  Admitted 5/18 - 10/14/17 with atrial fibrillation/RVR and acute on chronic systolic CHF.  Echo showed EF 30-35%. Diuresed and reloaded with Amio. Underwent successful DCCV 10/13/17.    Echo in 9/19 showed improvement in EF to 50% in NSR.   At last appointment, patient reported profound fatigue.  He was volume overloaded on exam and diuretic was increased.  CBC was done, hgb was low and patient ended up going to the ER for admission.  He was diagnosed with UGIB likely from duodenal ulcer, probably due to meloxicam.  This was stopped.   At last appointment, he was profoundly fatigued.  CBC showed hgb 6.7, he was sent to the ER where he had 2 unit transfusion and EGD was done again, showing duodenal stenosis.  I also stopped his atorvastatin.   Patient returns for followup of paroxysmal atrial fibrillation and CHF.  He is doing much better today compared to prior appointment.  Fatigue has totally resolved.  He attributes this to stopping the statin.  He is back at the gym working out. Weight is stable.  No exertional dyspnea or chest pain.  No orthopnea/PND.  No BRBPR/melena.   Labs (3/19): LDL 65 Labs (8/19): K 5.2 => 4.1, creatinine 1.6 Labs (9/19): K 3.9, creatinine 1.78, hgb 9.6, TSH normal.  Labs (10/19): hgb 6.7 => 11.8, LFTs normal, K 3.6, creatinine 1.3  ECG (personally reviewed): NSR, LBBB QRS 140 msec  Review of  systems complete and found to be negative unless listed in HPI.    Past Medical History 1. Chronic systolic CHF: Possible tachy-mediated cardiomyopathy.    - Echo (08/13/2017): LVEF 30-35%, moderate AS, mild AI, severe LAE, RV mild dilated with systolic function mild/mod reduced, PA peak pressure 46 mm Hg. - Echo (9/19): EF 50%, mild diffuse hypokinesis, normal RV size and systolic function, PASP 70 mmHg, moderate AS, moderate AI, moderate MR.  2. Atrial fibrillation: Has been difficult to control.  Failed Tikosyn and multiple DCCVs.  Failed amiodarone prior to ablation.  Atrial fibrillation ablation but then had ERAF.  He was cardioverted again in 5/19 with return to NSR.    3. Bicuspid aortic valve disorder: Moderate AS and moderate AI on last echo.   4. HTN 5. Hyperlipidemia: Severe fatigue with atorvastatin.  6. Mitral regurgitation: Moderate on last echo.  7. CAD: coronary calcium noted on chest CT.  8. CKD: Stage 3.  9. Carotid stenosis: Carotid dopplers (5/05) with 39-76% LICA stenosis.  10. Chronic LBBB 11. PUD: UGIB in 9/19 with duodenal ulcer probably due to meloxicam use.  - EGD (10/19): Duodenal stenosis.   Current Outpatient Medications  Medication Sig Dispense Refill  . acetaminophen (TYLENOL) 500 MG tablet Take 1,000 mg by mouth every 6 (six) hours as needed for mild pain or headache.     Marland Kitchen amiodarone (PACERONE) 100 MG tablet Take 1 tablet (100 mg total) by mouth daily.    . carvedilol (COREG) 3.125  MG tablet Take 1 tablet (3.125 mg total) by mouth 2 (two) times daily with a meal. 180 tablet 3  . finasteride (PROPECIA) 1 MG tablet TAKE 1 TABLET(1 MG) BY MOUTH DAILY 90 tablet 1  . pantoprazole (PROTONIX) 40 MG tablet Take 1 tablet (40 mg total) by mouth 2 (two) times daily. 60 tablet 2  . sucralfate (CARAFATE) 1 g tablet Take 1 tablet (1 g total) by mouth 4 (four) times daily -  with meals and at bedtime. 120 tablet 1  . temazepam (RESTORIL) 15 MG capsule TAKE ONE CAPSULE BY  MOUTH AT BEDTIME AS NEEDED (Patient taking differently: Take 15 mg by mouth at bedtime. ) 30 capsule 5  . torsemide (DEMADEX) 20 MG tablet Take 2 tabs in AM and 1 tab in PM (Patient taking differently: Take 20-40 mg by mouth See admin instructions. Take 2 tablets (40 mg) by mouth every morning and 1 tablet (20 mg) at night) 90 tablet 2  . XARELTO 15 MG TABS tablet TAKE 1 TABLET(15 MG) BY MOUTH DAILY WITH SUPPER 60 tablet 6  . Carboxymethylcell-Hypromellose (GENTEAL) 0.25-0.3 % GEL Place 1 drop into both eyes at bedtime.    Marland Kitchen losartan (COZAAR) 25 MG tablet Take 0.5 tablets (12.5 mg total) by mouth daily. 15 tablet 3   No current facility-administered medications for this encounter.    No Known Allergies  Social History   Socioeconomic History  . Marital status: Single    Spouse name: Not on file  . Number of children: 0  . Years of education: Not on file  . Highest education level: Not on file  Occupational History  . Occupation: Retired  Scientific laboratory technician  . Financial resource strain: Not on file  . Food insecurity:    Worry: Not on file    Inability: Not on file  . Transportation needs:    Medical: Not on file    Non-medical: Not on file  Tobacco Use  . Smoking status: Former Smoker    Packs/day: 1.50    Years: 25.00    Pack years: 37.50    Last attempt to quit: 05/26/1986    Years since quitting: 31.8  . Smokeless tobacco: Never Used  Substance and Sexual Activity  . Alcohol use: Yes    Alcohol/week: 1.0 standard drinks    Types: 1 Standard drinks or equivalent per week    Comment: stopped drinking   . Drug use: No  . Sexual activity: Not Currently  Lifestyle  . Physical activity:    Days per week: Not on file    Minutes per session: Not on file  . Stress: Not on file  Relationships  . Social connections:    Talks on phone: Not on file    Gets together: Not on file    Attends religious service: Not on file    Active member of club or organization: Not on file     Attends meetings of clubs or organizations: Not on file    Relationship status: Not on file  . Intimate partner violence:    Fear of current or ex partner: Not on file    Emotionally abused: Not on file    Physically abused: Not on file    Forced sexual activity: Not on file  Other Topics Concern  . Not on file  Social History Narrative   Homosexual. Lives alone. Not sexually active.       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 Stroke Father 83       smoker  . Lung cancer Mother 33       former smoker  . Stroke Mother   . Stroke Brother   . Colon cancer Neg Hx    Vitals:   03/30/18 0932  BP: 118/70  Pulse: (!) 50  SpO2: 98%  Weight: 76.9 kg (169 lb 9.6 oz)     Wt Readings from Last 3 Encounters:  03/30/18 76.9 kg (169 lb 9.6 oz)  03/04/18 75 kg (165 lb 4 oz)  02/28/18 75.1 kg (165 lb 9.1 oz)    PHYSICAL EXAM: General: NAD Neck: No JVD, no thyromegaly or thyroid nodule.  Lungs: Clear to auscultation bilaterally with normal respiratory effort. CV: Nondisplaced PMI.  Heart regular S1/S2, no H3/Z1, 3/6 systolic murmur RUSB with clear S2.  No peripheral edema.  No carotid bruit.  Normal pedal pulses.  Abdomen: Soft, nontender, no hepatosplenomegaly, no distention.  Skin: Intact without lesions or rashes.  Neurologic: Alert and oriented x 3.  Psych: Normal affect. Extremities: No clubbing or cyanosis.  HEENT: Normal.   ASSESSMENT & PLAN: 1. Chronic systolic CHF: Echo 6/96 with EF 30-35%, mild to moderately decreased RV systolic function in setting of atrial fibrillation with RVR.  He is now in NSR, and echo in 9/19 showed EF up to 50%, mild diffuse hypokinesis.  I suspect he had a tachy-mediated cardiomyopathy.  He does not look volume overloaded on exam, NYHA class II symptoms.    - Continue torsemide 40 qam/20 qpm.   - With  stabilization of creatinine, restart losartan 12.5 mg daily.  BMET 10 days.  - Continue Coreg 3.125 mg bid.   - Off spironolactone with elevated K.  Can leave off for now with EF back up to 50%.  - Need to either keep in NSR or eventually AVN ablate/pace (see below).  NSR today.  - With complete resolution of symptoms after stopping atorvastatin and correcting anemia, think we can hold off on cardiac cath for now.  2. Atrial fibrillation: Paroxysmal.  Difficult to control rate and rhythm. He failed Tikosyn. He started on amiodarone, then had atrial fibrillation ablation in 5/19 with ERAF, requiring DCCV.  He remains in NSR today with rare palpitations.  - Continue amiodarone 100 mg daily.  Check LFTs/TSH with labs in 10 days, he will need regular eye exam.  - Continue Xarelto.  - If he fails to maintain NSR despite amiodarone and DCCV, may need AV nodal ablation/BiV pacing.  3. CAD: Extensive coronary calcification on recent CT chest. No chest pain. I suspect that his cardiomyopathy is tachycardia-mediated given rise in EF in NSR rather than ischemic cardiomyopathy.  - No ASA with Xarelto use.  - Off atorvastatin with resolution of profound fatigue.  He does not think he can take another statin.  I will refer him to lipid clinic to try to get him Repatha.   4. CKD: Stage 3.  Creatinine improved most recently, repeat BMET in 10 days.  5. Bicuspid aortic valve stenosis: Moderate AS/moderate AI on last echo.  6. Mitral regurgitation: Moderate on echo in 9/19.   7. LBBB: Chronic.  8. Anemia: Suspect due to upper GI bleeding.  Recent admission with duodenal ulcer.  Suspect profound anemia helped explain his marked fatigue.  - To see GI in followup.  - Continuing Xarelto for now.  Followup in 3 months.   [name], MD 03/30/18

## 2018-03-30 NOTE — Telephone Encounter (Signed)
This is a CHF pt 

## 2018-04-07 ENCOUNTER — Encounter: Payer: Self-pay | Admitting: Family Medicine

## 2018-04-07 ENCOUNTER — Ambulatory Visit (INDEPENDENT_AMBULATORY_CARE_PROVIDER_SITE_OTHER): Payer: Medicare Other | Admitting: Family Medicine

## 2018-04-07 VITALS — BP 122/66 | HR 55 | Temp 97.4°F | Ht 68.0 in | Wt 171.2 lb

## 2018-04-07 DIAGNOSIS — I6523 Occlusion and stenosis of bilateral carotid arteries: Secondary | ICD-10-CM | POA: Diagnosis not present

## 2018-04-07 DIAGNOSIS — Z6826 Body mass index (BMI) 26.0-26.9, adult: Secondary | ICD-10-CM | POA: Diagnosis not present

## 2018-04-07 DIAGNOSIS — M159 Polyosteoarthritis, unspecified: Secondary | ICD-10-CM

## 2018-04-07 DIAGNOSIS — M150 Primary generalized (osteo)arthritis: Secondary | ICD-10-CM | POA: Diagnosis not present

## 2018-04-07 DIAGNOSIS — R1032 Left lower quadrant pain: Secondary | ICD-10-CM | POA: Diagnosis not present

## 2018-04-07 MED ORDER — TRAMADOL HCL 50 MG PO TABS: 50.0000 mg | ORAL_TABLET | Freq: Four times a day (QID) | ORAL | 5 refills | Status: DC | PRN

## 2018-04-07 NOTE — Progress Notes (Signed)
Subjective:  Travis [name] is a 77 y.o. year old very pleasant male patient who presents for/with See problem oriented charting ROS-no reported chest pain or shortness of breath.  Does have hand pain and neck pain.  No redness of hands.  Feels groin bulge at times with discomfort.  Past Medical History-  Patient Active Problem List   Diagnosis Date Noted  . GI bleed 02/06/2018    Priority: High  . Chronic systolic heart failure ([place]) 10/10/2017    Priority: High  . Persistent atrial fibrillation 10/06/2017    Priority: High  . Pulmonary hypertension ([place]) 03/09/2012    Priority: High  . Bicuspid aortic valve     Priority: High  . Cardiomyopathy, rate related with resolution now recurrent 12/08/2011    Priority: High  . Hyperlipidemia 12/19/2014    Priority: Medium  . Anxiety state 03/06/2014    Priority: Medium  . Insomnia 03/06/2014    Priority: Medium  . Sinus bradycardia 01/04/2013    Priority: Medium  . Hyponatremia 02/21/2012    Priority: Medium  . Hypertension     Priority: Medium  . Left bundle branch block 12/10/2010    Priority: Medium  . Osteoarthritis 02/04/2007    Priority: Medium  . Symptomatic anemia 08/01/2015    Priority: Low  . Male pattern baldness 03/06/2014    Priority: Low  . Overweight (BMI 25.0-29.9) 02/08/2014    Priority: Low  . S/P TKR (total knee replacement) 02/08/2014    Priority: Low  . DRY MOUTH 11/07/2009    Priority: Low  . Carotid bruit 07/05/2009    Priority: Low  . GERD 01/07/2008    Priority: Low  . GANGLION CYST, WRIST, LEFT 02/04/2007    Priority: Low  . Acute blood loss anemia 03/01/2018  . Pyloric stenosis, acquired 02/28/2018  . Duodenal ulcer, with obstruction 02/28/2018    Medications- reviewed and updated Current Outpatient Medications  Medication Sig Dispense Refill  . acetaminophen (TYLENOL) 500 MG tablet Take 1,000 mg by mouth every 6 (six) hours as needed for mild pain or headache.     Marland Kitchen amiodarone (PACERONE)  100 MG tablet Take 1 tablet (100 mg total) by mouth daily.    . Carboxymethylcell-Hypromellose (GENTEAL) 0.25-0.3 % GEL Place 1 drop into both eyes at bedtime.    . carvedilol (COREG) 3.125 MG tablet Take 1 tablet (3.125 mg total) by mouth 2 (two) times daily with a meal. 180 tablet 3  . finasteride (PROPECIA) 1 MG tablet TAKE 1 TABLET(1 MG) BY MOUTH DAILY 90 tablet 1  . losartan (COZAAR) 25 MG tablet Take 0.5 tablets (12.5 mg total) by mouth daily. 15 tablet 3  . pantoprazole (PROTONIX) 40 MG tablet Take 1 tablet (40 mg total) by mouth 2 (two) times daily. 60 tablet 2  . sucralfate (CARAFATE) 1 g tablet Take 1 tablet (1 g total) by mouth 4 (four) times daily -  with meals and at bedtime. 120 tablet 1  . temazepam (RESTORIL) 15 MG capsule TAKE ONE CAPSULE BY MOUTH AT BEDTIME AS NEEDED (Patient taking differently: Take 15 mg by mouth at bedtime. ) 30 capsule 5  . torsemide (DEMADEX) 20 MG tablet Take 2 tabs in AM and 1 tab in PM (Patient taking differently: Take 20-40 mg by mouth See admin instructions. Take 2 tablets (40 mg) by mouth every morning and 1 tablet (20 mg) at night) 90 tablet 2  . XARELTO 15 MG TABS tablet TAKE 1 TABLET(15 MG) BY MOUTH DAILY WITH  SUPPER 60 tablet 6  . traMADol (ULTRAM) 50 MG tablet Take 1 tablet (50 mg total) by mouth every 6 (six) hours as needed for moderate pain or severe pain (arthritis -hands and neck. 1 month RX). 60 tablet 5   No current facility-administered medications for this visit.     Objective: BP 122/66 (BP Location: Left Arm, Patient Position: Sitting, Cuff Size: Large)   Pulse (!) 55   Temp (!) 97.4 F (36.3 C) (Oral)   Ht 5\' 8"  (1.727 m)   Wt 171 lb 3.2 oz (77.7 kg)   SpO2 96%   BMI 26.03 kg/m  Gen: NAD, resting comfortably CV: RRR no murmurs rubs or gallops Lungs: CTAB no crackles, wheeze, rhonchi Abdomen: soft/nontender/nondistended Ext: no edema Skin: warm, dry GU-no obvious groin hernia detected  Assessment/Plan:  Left groin  pain/hernia S: patient reports left hernia surgery back in 2013. Recently he has noted swelling in right groin. Saw Dr. Ninfa Linden years ago. Trouble going up stairs and biking at gym. Still reduces but he wants to go ahead and have this treated.  A/P: I cannot detect hernia on exam today but given bulge patient has felt that later reduces- I think its reasonable to refer to Dr. Ninfa Linden again for his opinion and potential repair. Would need [facility] cardiology clearance  Osteoarthritis S: Patient stopped mobic back in September due to GI bleeds. He then was actually doing ok for about a month before started having worsening of pain. It has really gotten unbearable in his hand joints as well as his neck.  Right now his pain is only 3 out of 10 but it can be much worse.  Has tried tylenol arthritis 2 pills twice a day-gets some relief from this including today, CBD cream, UV wrist gimmick with minimal relief.  A/P: Poor control of pain from arthritis- NSAIDs orally should be avoided due to history of GI bleed.  Also not ideal given his cardiac history.  Topical voltaren discussed -he is concerned about volume of application as would need on multiple joints of the hand as well as the neck.  We ultimately decided to continue his Tylenol arthritis 2 pills twice a day.  We will also give him tramadol which he can use up to twice a day on average per month if his pain gets above a 5 out of 10 on the Tylenol  We discussed constipation risk and can use as needed MiraLAX  BMI noted as mildly elevated-at his age and with comorbidities-I think staying at current weight is very reasonable  Future Appointments  Date Time Provider [place]  04/13/2018 10:00 AM [facility] None  04/14/2018  1:45 PM Milus Banister, MD [facility]  07/02/2018 10:20 AM Larey Dresser, MD [facility] None  02/07/2019  2:00 PM [facility] HEALTH COACH [facility]   Lab/Order associations: Left groin pain - Plan:  Ambulatory referral to General Surgery  Body mass index 26.0-26.9, adult  Primary osteoarthritis involving multiple joints  Meds ordered this encounter  Medications  . DISCONTD: traMADol (ULTRAM) 50 MG tablet- printed accidentally- we shredded this copy after tearing apart     Sig: Take 1 tablet (50 mg total) by mouth every 6 (six) hours as needed for moderate pain or severe pain (arthritis in hands and neck. 1 month prescription.).    Dispense:  60 tablet    Refill:  5  . traMADol (ULTRAM) 50 MG tablet    Sig: Take 1 tablet (50 mg total)  by mouth every 6 (six) hours as needed for moderate pain or severe pain (arthritis -hands and neck. 1 month RX).    Dispense:  60 tablet    Refill:  5    Return precautions advised.  [name], MD

## 2018-04-07 NOTE — Patient Instructions (Addendum)
Continue Tylenol arthritis 2 pills twice a day.  If your arthritic pains get above 5 out of 10 may take a tramadol up to every 6 hours.  You have enough tramadol per prescription to on average take twice a day if needed but I am hopeful you do not need anywhere near this.  We will call you within two weeks about your referral to Dr. Ninfa Linden of [facility] surgery. If you do not hear within 3 weeks, give [place] a call.

## 2018-04-07 NOTE — Assessment & Plan Note (Signed)
S: Patient stopped mobic back in September due to GI bleeds. He then was actually doing ok for about a month before started having worsening of pain. It has really gotten unbearable in his hand joints as well as his neck.  Right now his pain is only 3 out of 10 but it can be much worse.  Has tried tylenol arthritis 2 pills twice a day-gets some relief from this including today, CBD cream, UV wrist gimmick with minimal relief.  A/P: Poor control of pain from arthritis- NSAIDs orally should be avoided due to history of GI bleed.  Also not ideal given his cardiac history.  Topical voltaren discussed -he is concerned about volume of application as would need on multiple joints of the hand as well as the neck.  We ultimately decided to continue his Tylenol arthritis 2 pills twice a day.  We will also give him tramadol which he can use up to twice a day on average per month if his pain gets above a 5 out of 10 on the Tylenol  We discussed constipation risk and can use as needed MiraLAX

## 2018-04-08 ENCOUNTER — Other Ambulatory Visit: Payer: Self-pay | Admitting: Family Medicine

## 2018-04-13 ENCOUNTER — Encounter: Payer: Medicare Other | Admitting: Gastroenterology

## 2018-04-13 ENCOUNTER — Ambulatory Visit (HOSPITAL_COMMUNITY)
Admission: RE | Admit: 2018-04-13 | Discharge: 2018-04-13 | Disposition: A | Discharge status: Home / Self Care | Payer: Medicare Other | Source: Ambulatory Visit | Attending: Cardiology | Admitting: Cardiology

## 2018-04-13 DIAGNOSIS — I5022 Chronic systolic (congestive) heart failure: Secondary | ICD-10-CM | POA: Diagnosis not present

## 2018-04-13 LAB — COMPREHENSIVE METABOLIC PANEL
ALBUMIN: 3.8 g/dL (ref 3.5–5.0)
ALT: 15 U/L (ref 0–44)
ANION GAP: 8 (ref 5–15)
AST: 22 U/L (ref 15–41)
Alkaline Phosphatase: 73 U/L (ref 38–126)
BILIRUBIN TOTAL: 0.7 mg/dL (ref 0.3–1.2)
BUN: 15 mg/dL (ref 8–23)
CHLORIDE: 97 mmol/L — AB (ref 98–111)
CO2: 27 mmol/L (ref 22–32)
Calcium: 8.9 mg/dL (ref 8.9–10.3)
Creatinine, Ser: 1.23 mg/dL (ref 0.61–1.24)
GFR calc Af Amer: >60 mL/min (ref >60)
GFR calc non Af Amer: 55 mL/min — ABNORMAL LOW (ref >60)
Glucose, Bld: 89 mg/dL (ref 70–99)
POTASSIUM: 4.2 mmol/L (ref 3.5–5.1)
Sodium: 132 mmol/L — ABNORMAL LOW (ref 135–145)
Total Protein: 7.1 g/dL (ref 6.5–8.1)

## 2018-04-13 LAB — TSH: TSH: 4.37 u[IU]/mL (ref 0.350–4.500)

## 2018-04-14 ENCOUNTER — Ambulatory Visit (INDEPENDENT_AMBULATORY_CARE_PROVIDER_SITE_OTHER): Payer: Medicare Other | Admitting: Gastroenterology

## 2018-04-14 ENCOUNTER — Ambulatory Visit: Payer: Medicare Other | Admitting: Gastroenterology

## 2018-04-14 ENCOUNTER — Encounter: Payer: Self-pay | Admitting: Gastroenterology

## 2018-04-14 VITALS — BP 138/70 | HR 62 | Ht 68.0 in | Wt 171.0 lb

## 2018-04-14 DIAGNOSIS — I6523 Occlusion and stenosis of bilateral carotid arteries: Secondary | ICD-10-CM

## 2018-04-14 DIAGNOSIS — K269 Duodenal ulcer, unspecified as acute or chronic, without hemorrhage or perforation: Secondary | ICD-10-CM

## 2018-04-14 NOTE — Patient Instructions (Addendum)
CBC in 3 months. Stay on protonix twice daily. Continue to avoid NSAIDs. Start miralax powder one dose once daily for your constipation.  You are due for labs in February  Thank you for entrusting me with your care and choosing [facility].  Dr Ardis Hughs

## 2018-04-14 NOTE — Progress Notes (Signed)
Review of pertinent gastrointestinal problems:  1. Routine risk for colon cancer.colonoscopy October 2007 found diverticulosis, no polyps or cancers. N  2. personal history of gastric ulcer,likely NSAID related. Fall 2007 had 2 endoscopies, the second EGD proved that the previous gastric ulcer had healed.  3. GERD, well treated with once daily proton pump inhibitor 4.  NSAID related peptic ulcer in second duodenum causing bleeding and asymptomatic stricture.  EGD September and October 2019.  See below   HPI: This is a very pleasant 77 year old man whom I last saw a year or 2 ago.  He was however hospitalized twice recently due to a duodenal ulcer.   September 2019 he was admitted with acute on chronic anemia.  He was found to have duodenal stricture and also duodenal ulcer, these were thought likely from peptic ulcer disease, NSAID related.  Biopsies from the duodenum showed no sign of cancer and no H. pylori.  He was readmitted 3 or 4 weeks later, October 2019 with recurrent drop in hemoglobin from 9.6-6.8.  The October upper endoscopy showed normal esophagus, normal duodenum bulb, duodenal stricture that was able to be passed after pediatric endoscope was used.  Biopsies from the stricture show "peptic duodenitis" he was told to continue proton pump inhibitor, Carafate and continue to avoid NSAIDs.  He hasparoxysmal atrial fibrillation, mild CHF.  An echocardiogram September 2019 showed a left ventricular ejection fraction of 50%.  More recently has been in normal sinus rhythm.  Chief complaint is duodenal ulcer   Looks well overall  He has not been taking iron  Takes protonix bid.  stopped meloxicam completely.    Was having arthritis related hand pains.  Tylenol didn't work.  Tramadol does help but can constipate him.  Eats whatever he wants without nausea, vomiting, postprandial abdominal pains.  No GOO symptoms.  ROS: complete GI ROS as described in HPI, all other review  negative.  Constitutional:  No unintentional weight loss   Past Medical History:  Diagnosis Date  . Arthritis   . Biatrial enlargement    severe  . Bicuspid aortic valve   . CHF (congestive heart failure) ([place])    JULY 2014  . Chronic renal insufficiency   . GERD (gastroesophageal reflux disease)   . History of cardioversion 12/16/2012  . History of stomach ulcers   . Hypertension   . Left bundle branch block   . Nonischemic cardiomyopathy ([place])       . Persistent atrial fibrillation    multiple prior cardioversion  . Sinus bradycardia   . Status post clamping of cerebral aneurysm 79    Past Surgical History:  Procedure Laterality Date  . ATRIAL FIBRILLATION ABLATION N/A 10/06/2017   Procedure: ATRIAL FIBRILLATION ABLATION;  Surgeon: [name], MD;  Location: [facility];  Service: Cardiovascular;  Laterality: N/A;  . BIOPSY  02/06/2018   Procedure: BIOPSY;  Surgeon: Lavena Bullion, DO;  Location: [facility] ENDOSCOPY;  Service: Gastroenterology;;  . BIOPSY  02/28/2018   Procedure: BIOPSY;  Surgeon: Mauri Pole, MD;  Location: [facility];  Service: Endoscopy;;  . CARDIAC CATHETERIZATION    . CARDIOVERSION N/A 12/16/2012   Procedure: CARDIOVERSION;  Surgeon: Lelon Perla, MD;  Location: [facility] ENDOSCOPY;  Service: Cardiovascular;  Laterality: N/A;  . CARDIOVERSION N/A 08/14/2017   Procedure: CARDIOVERSION;  Surgeon: Josue Hector, MD;  Location[facility] ENDOSCOPY;  Service: Cardiovascular;  Laterality: N/A;  . CARDIOVERSION N/A 09/21/2017   Procedure: CARDIOVERSION;  Surgeon: Sanda Klein, MD;  Location: [facility];  Service: Cardiovascular;  Laterality: N/A;  . CARDIOVERSION N/A 10/13/2017   Procedure: CARDIOVERSION;  Surgeon: Josue Hector, MD;  Location: [facility] ENDOSCOPY;  Service: Cardiovascular;  Laterality: N/A;  . CATARACT EXTRACTION  05/2015   bilateral  . cerebral anuersym post clips    . ESOPHAGOGASTRODUODENOSCOPY N/A 02/06/2018   Procedure:  ESOPHAGOGASTRODUODENOSCOPY (EGD);  Surgeon: Lavena Bullion, DO;  Location: [facility] ENDOSCOPY;  Service: Gastroenterology;  Laterality: N/A;  . ESOPHAGOGASTRODUODENOSCOPY N/A 02/28/2018   Procedure: ESOPHAGOGASTRODUODENOSCOPY (EGD);  Surgeon: Mauri Pole, MD;  Location: [facility] ENDOSCOPY;  Service: Endoscopy;  Laterality: N/A;  . fractured left arm    . HERNIA REPAIR     lft  . INGUINAL HERNIA REPAIR  07/02/2011   Procedure: LAPAROSCOPIC INGUINAL HERNIA;  Surgeon: Harl Bowie, MD;  Location: [facility];  Service: General;  Laterality: Left;  Laparoscopic left inguinal hernia repair and mesh  . left tendon repair     lft foot  . ROTATOR CUFF REPAIR     lf  . TEE WITHOUT CARDIOVERSION N/A 08/14/2017   Procedure: TRANSESOPHAGEAL ECHOCARDIOGRAM (TEE);  Surgeon: Josue Hector, MD;  Location: [facility] ENDOSCOPY;  Service: Cardiovascular;  Laterality: N/A;  . TONSILLECTOMY    . TOTAL KNEE ARTHROPLASTY Bilateral 02/06/2014   Procedure: TOTAL KNEE BILATERAL;  Surgeon: Mauri Pole, MD;  Location: [facility];  Service: Orthopedics;  Laterality: Bilateral;  . WRIST GANGLION EXCISION     lft    Current Outpatient Medications  Medication Sig Dispense Refill  . acetaminophen (TYLENOL) 500 MG tablet Take 1,000 mg by mouth every 6 (six) hours as needed for mild pain or headache.     Marland Kitchen amiodarone (PACERONE) 100 MG tablet Take 1 tablet (100 mg total) by mouth daily.    . Carboxymethylcell-Hypromellose (GENTEAL) 0.25-0.3 % GEL Place 1 drop into both eyes at bedtime.    . carvedilol (COREG) 3.125 MG tablet Take 1 tablet (3.125 mg total) by mouth 2 (two) times daily with a meal. 180 tablet 3  . finasteride (PROPECIA) 1 MG tablet TAKE 1 TABLET(1 MG) BY MOUTH DAILY 90 tablet 1  . losartan (COZAAR) 25 MG tablet Take 0.5 tablets (12.5 mg total) by mouth daily. 15 tablet 3  . pantoprazole (PROTONIX) 40 MG tablet Take 1 tablet (40 mg total) by mouth 2 (two) times daily. 60 tablet 2  . sucralfate (CARAFATE) 1 g tablet  Take 1 tablet (1 g total) by mouth 4 (four) times daily -  with meals and at bedtime. 120 tablet 1  . temazepam (RESTORIL) 15 MG capsule TAKE ONE CAPSULE BY MOUTH AT BEDTIME AS NEEDED (Patient taking differently: Take 15 mg by mouth at bedtime. ) 30 capsule 5  . torsemide (DEMADEX) 20 MG tablet Take 2 tabs in AM and 1 tab in PM (Patient taking differently: Take 20-40 mg by mouth See admin instructions. Take 2 tablets (40 mg) by mouth every morning and 1 tablet (20 mg) at night) 90 tablet 2  . traMADol (ULTRAM) 50 MG tablet Take 1 tablet (50 mg total) by mouth every 6 (six) hours as needed for moderate pain or severe pain (arthritis -hands and neck. 1 month RX). 60 tablet 5  . XARELTO 15 MG TABS tablet TAKE 1 TABLET(15 MG) BY MOUTH DAILY WITH SUPPER 60 tablet 6   No current facility-administered medications for this visit.     Allergies as of 04/14/2018  . (No Known Allergies)    Family History  Problem Relation Age  of Onset  . Stroke Father 66       smoker  . Lung cancer Mother 48       former smoker  . Stroke Mother   . Stroke Brother   . Colon cancer Neg Hx     Social History   Socioeconomic History  . Marital status: Single    Spouse name: Not on file  . Number of children: 0  . Years of education: Not on file  . Highest education level: Not on file  Occupational History  . Occupation: Retired  Scientific laboratory technician  . Financial resource strain: Not on file  . Food insecurity:    Worry: Not on file    Inability: Not on file  . Transportation needs:    Medical: Not on file    Non-medical: Not on file  Tobacco Use  . Smoking status: Former Smoker    Packs/day: 1.50    Years: 25.00    Pack years: 37.50    Last attempt to quit: 05/26/1986    Years since quitting: 31.9  . Smokeless tobacco: Never Used  Substance and Sexual Activity  . Alcohol use: Yes    Alcohol/week: 1.0 standard drinks    Types: 1 Standard drinks or equivalent per week    Comment: stopped drinking   . Drug  use: No  . Sexual activity: Not Currently  Lifestyle  . Physical activity:    Days per week: Not on file    Minutes per session: Not on file  . Stress: Not on file  Relationships  . Social connections:    Talks on phone: Not on file    Gets together: Not on file    Attends religious service: Not on file    Active member of club or organization: Not on file    Attends meetings of clubs or organizations: Not on file    Relationship status: Not on file  . Intimate partner violence:    Fear of current or ex partner: Not on file    Emotionally abused: Not on file    Physically abused: Not on file    Forced sexual activity: Not on file  Other Topics Concern  . Not on file  Social History Narrative   Homosexual. Lives alone. Not sexually active.       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 male with NSAID related duodenal ulcer  I do think that his ulcer was related to meloxicam which she was taking for arthritis pains.  He had an ulcer with this long time ago and then also more recently.  He has been completely avoiding NSAIDs since his hospitalization 1 and 2 months ago.  He takes Protonix twice daily.  He will continue taking the Protonix.  His blood counts have nearly normalized.  He will get a repeat CBC through this office in 3 months.  I do not think there is any reason to reevaluate his upper GI tract since he is completely asymptomatic at this point.  He knows to call here if he has any further questions or concerns.  He is  going to take tramadol for his arthritis pains and MiraLAX for resultant constipation.  Please see the "Patient Instructions" section for addition details about the plan.  Owens Loffler, MD [facility] 04/14/2018, 2:03 PM

## 2018-04-26 ENCOUNTER — Other Ambulatory Visit: Payer: Self-pay | Admitting: Family Medicine

## 2018-05-04 ENCOUNTER — Telehealth: Payer: Self-pay

## 2018-05-04 NOTE — Telephone Encounter (Signed)
PA initiated via [email or web address]  

## 2018-05-04 NOTE — Telephone Encounter (Signed)
-----   Message from Timothy Lasso, RN sent at 04/30/2018  8:37 AM EST -----   ----- Message ----- From: Timothy Lasso, RN Sent: 04/30/2018 To: Timothy Lasso, RN    ----- Message ----- From: Timothy Lasso, RN Sent: 04/28/2018 To: Timothy Lasso, RN  Repeat EGD with Dr. Ardis Hughs in 8 weeks

## 2018-05-04 NOTE — Telephone Encounter (Signed)
Per last office note with Dr Ardis Hughs  "I do not think there is any reason to reevaluate his upper GI tract since he is completely asymptomatic at this point.  He knows to call here if he has any further questions or concerns."  Pt was notified of this at that office visit

## 2018-05-04 NOTE — Telephone Encounter (Signed)
Temazepam 15 mg cap, take 1 po qhs at bedtime [place] plan Member ID [id number]

## 2018-05-04 NOTE — Telephone Encounter (Signed)
Unable to confirm insurance. Will need to contact pharmacy for BIN, PCN, RxGRP, and member ID#.

## 2018-05-05 ENCOUNTER — Telehealth: Payer: Self-pay | Admitting: Gastroenterology

## 2018-05-05 ENCOUNTER — Other Ambulatory Visit: Payer: Self-pay | Admitting: Gastroenterology

## 2018-05-05 MED ORDER — PANTOPRAZOLE SODIUM 40 MG PO TBEC: 40.0000 mg | DELAYED_RELEASE_TABLET | Freq: Two times a day (BID) | ORAL | 2 refills | Status: DC

## 2018-05-05 NOTE — Telephone Encounter (Signed)
Sent Pantoprazole to pharmacy. Patient notified.

## 2018-05-05 NOTE — Progress Notes (Addendum)
Patient ID: Travis [name]                 DOB: April 03, 1941                    MRN: [id number]     HPI: Travis [name] is a 77 y.o. male patient referred to lipid clinic by [name]. PMH is significant for afib, gastric ulcer, CHF, HTN, HLD tachy-mediated CMP, CAD(calcium noted on CT), CKD. Patient was taking atorvastatin 40mg  weekly, changed to 10mg  daily, however, patient was unable to tolerate due to muscle aches/fatigue.   Current Medications: none Intolerances: atorvastatin 40mg  weekly (changed to daily dosing), atorvastatin 10mg  daily (fatigue) Risk Factors:  HTN, HLD, family hx, Calcium score 383 (64 percentile for age/sex) LDL goal: <70  Diet: fish, salads  Exercise: personal trainer 3 times a week  Family History: father with stroke, mother with stroke and lung cancer, brother with stroke  Social History: former smoker, 1 drink of alcohol per week  Labs:  02/04/18: LDL 104, TC 170, TG 50, HDL 65 (atorvastatin 10 daily) 11/19:AST 22 ALT 15   Past Medical History:  Diagnosis Date  . Arthritis   . Biatrial enlargement    severe  . Bicuspid aortic valve   . CHF (congestive heart failure) ([place])    JULY 2014  . Chronic renal insufficiency   . GERD (gastroesophageal reflux disease)   . History of cardioversion 12/16/2012  . History of stomach ulcers   . Hypertension   . Left bundle branch block   . Nonischemic cardiomyopathy ([place])       . Persistent atrial fibrillation    multiple prior cardioversion  . Sinus bradycardia   . Status post clamping of cerebral aneurysm 80    Current Outpatient Medications on File Prior to Visit  Medication Sig Dispense Refill  . acetaminophen (TYLENOL) 500 MG tablet Take 1,000 mg by mouth every 6 (six) hours as needed for mild pain or headache.     Marland Kitchen amiodarone (PACERONE) 100 MG tablet Take 1 tablet (100 mg total) by mouth daily.    . Carboxymethylcell-Hypromellose (GENTEAL) 0.25-0.3 % GEL Place 1 drop into both eyes at bedtime.    .  carvedilol (COREG) 3.125 MG tablet Take 1 tablet (3.125 mg total) by mouth 2 (two) times daily with a meal. 180 tablet 3  . finasteride (PROPECIA) 1 MG tablet TAKE 1 TABLET(1 MG) BY MOUTH DAILY 90 tablet 1  . losartan (COZAAR) 25 MG tablet Take 0.5 tablets (12.5 mg total) by mouth daily. 15 tablet 3  . pantoprazole (PROTONIX) 40 MG tablet Take 1 tablet (40 mg total) by mouth 2 (two) times daily. 60 tablet 2  . sucralfate (CARAFATE) 1 g tablet Take 1 tablet (1 g total) by mouth 4 (four) times daily -  with meals and at bedtime. 120 tablet 1  . temazepam (RESTORIL) 15 MG capsule TAKE 1 CAPSULE BY MOUTH EVERY DAY AT BEDTIME AS NEEDED 30 capsule 5  . torsemide (DEMADEX) 20 MG tablet Take 2 tabs in AM and 1 tab in PM (Patient taking differently: Take 20-40 mg by mouth See admin instructions. Take 2 tablets (40 mg) by mouth every morning and 1 tablet (20 mg) at night) 90 tablet 2  . traMADol (ULTRAM) 50 MG tablet Take 1 tablet (50 mg total) by mouth every 6 (six) hours as needed for moderate pain or severe pain (arthritis -hands and neck. 1 month RX). 60 tablet 5  .  XARELTO 15 MG TABS tablet TAKE 1 TABLET(15 MG) BY MOUTH DAILY WITH SUPPER 60 tablet 6   No current facility-administered medications on file prior to visit.     No Known Allergies  Assessment/Plan:  1. Hyperlipidemia -Discussed with patient different treatment options. Patient has only failed one statin. Since statins are an important backbone to CV risk reduction and insurance will not cover PCSK9 inbitors without failure of 2 statins, discussed with patient about trying rosuvastatin. Due to hx of issues with statins, will start on low dose rosuvastatin 5mg  daily. If unable to tolerate, can consider 2-3 times a week dosing if needed. Will call pt in about a month to see how he is doing. Pt was also given our direct number if he has any problems with medication to call. Pt has appointment with [name] in February. Will order labs at follow  up phone call if pt still tolerating medication.  Ramond Dial, Pharm.D, BCPS Clinical Pharmacist

## 2018-05-06 ENCOUNTER — Ambulatory Visit (INDEPENDENT_AMBULATORY_CARE_PROVIDER_SITE_OTHER): Payer: Medicare Other | Admitting: Pharmacist

## 2018-05-06 DIAGNOSIS — I6523 Occlusion and stenosis of bilateral carotid arteries: Secondary | ICD-10-CM

## 2018-05-06 DIAGNOSIS — E785 Hyperlipidemia, unspecified: Secondary | ICD-10-CM | POA: Diagnosis not present

## 2018-05-06 MED ORDER — ROSUVASTATIN CALCIUM 5 MG PO TABS: 5.0000 mg | ORAL_TABLET | Freq: Every day | ORAL | 3 refills | Status: DC

## 2018-05-06 NOTE — Patient Instructions (Signed)
Start taking rosuvastatin 5mg  daily. Call [phone] if you have any issues with the medication.

## 2018-05-11 NOTE — Telephone Encounter (Signed)
Unable to confirm pt with information provided by pharmacy.

## 2018-05-11 NOTE — Telephone Encounter (Signed)
Called pharmacy - [place] Member ID [id number]

## 2018-05-20 ENCOUNTER — Other Ambulatory Visit (HOSPITAL_COMMUNITY): Payer: Self-pay

## 2018-05-20 MED ORDER — TORSEMIDE 20 MG PO TABS: 20.0000 mg | ORAL_TABLET | ORAL | 3 refills | Status: DC

## 2018-06-01 ENCOUNTER — Other Ambulatory Visit: Payer: Self-pay | Admitting: Surgery

## 2018-06-01 DIAGNOSIS — K4091 Unilateral inguinal hernia, without obstruction or gangrene, recurrent: Secondary | ICD-10-CM | POA: Diagnosis not present

## 2018-06-07 ENCOUNTER — Telehealth: Payer: Self-pay

## 2018-06-07 NOTE — Telephone Encounter (Signed)
   [facility] Pre-operative Risk Assessment    Request for surgical clearance:  1. What type of surgery is being performed? Open left inguinal hernia repair   2. When is this surgery scheduled? TBD   3. What type of clearance is required (medical clearance vs. Pharmacy clearance to hold med vs. Both)? Pharmacy  4. Are there any medications that need to be held prior to surgery and how long? Xarelto   5. Practice name and name of physician performing surgery? [facility]  6. What is your office phone number [phone]    7.   What is your office fax number [phone]  8.   Anesthesia type (None, local, MAC, general) ? Not listed   Mady Haagensen 06/07/2018, 1:38 PM  _________________________________________________________________   (provider comments below)

## 2018-06-09 NOTE — Telephone Encounter (Signed)
Pt takes Xarelto for afib with CHADS2VASc score of 5 (age x2, CHF, HTN, CAD). SCr 1.23, CrCl 54mL/min. Ok to hold Xarelto for 2 days prior to procedure.  Renal function has been improving since last fall - would recommend rechecking BMET as pt currently qualifies for Xarelto 20mg  daily and is taking reduced dose Xarelto 15mg  due to CrCl < 50 last fall.

## 2018-06-15 NOTE — Telephone Encounter (Signed)
Per pharmacy protocol OK to hold Xarelto 48 hours pre op if needed.  Kerin Ransom PA-C 06/15/2018 10:53 AM

## 2018-06-17 ENCOUNTER — Telehealth: Payer: Self-pay | Admitting: Pharmacist

## 2018-06-17 DIAGNOSIS — E785 Hyperlipidemia, unspecified: Secondary | ICD-10-CM

## 2018-06-17 NOTE — Telephone Encounter (Signed)
Patient states he is tolerating his rosuvastatin well. States he held for 1 week due to issue with his tramdol, but has had no problem since. He states he is due to labs with his PCP in a few weeks. Will order lipid panel and lft to be collected at [facility] on [address].

## 2018-06-21 NOTE — Pre-Procedure Instructions (Signed)
[name]  06/21/2018      [facility] [place], [address] [place] [address] Phone: [phone] Fax: [phone]    Your procedure is scheduled on June 24, 2018.  Report to [facility] Admitting at [place] AM.  Call this number if you have problems the morning of surgery:  [phone]   Remember:  Do not eat after midnight.  You may drink clear liquids until 1030 AM.  Clear liquids allowed are:           Water, Juice (non-citric and without pulp), Clear Tea, Black Coffee only and Gatorade    Take these medicines the morning of surgery with A SIP OF WATER  Amiodarone (pacerone) Carvedilol (coreg) Pantoprazole (protonix) Tramadol (ultram)  Follow your surgeon's instructions on when to hold/resume Xarelto.  If no instructions were given call the office to determine how they would like to you take Xarelto  7 days prior to surgery STOP taking any Aspirin (unless otherwise instructed by your surgeon), Aleve, Naproxen, Ibuprofen, Motrin, Advil, Goody's, BC's, all herbal medications, fish oil, and all vitamins    Do not wear jewelry  Do not wear lotions, powders, or colognes, or deodorant.  Men may shave face and neck.  Do not bring valuables to the hospital.  [facility] is not responsible for any belongings or valuables.  Contacts, dentures or bridgework may not be worn into surgery.  Leave your suitcase in the car.  After surgery it may be brought to your room.  For patients admitted to the hospital, discharge time will be determined by your treatment team.  Patients discharged the day of surgery will not be allowed to drive home.    [place]- Preparing For Surgery  Before surgery, you can play an important role. Because skin is not sterile, your skin needs to be as free of germs as possible. You can reduce the number of germs on your skin by washing with  CHG (chlorahexidine gluconate) Soap before surgery.  CHG is an antiseptic cleaner which kills germs and bonds with the skin to continue killing germs even after washing.    Oral Hygiene is also important to reduce your risk of infection.  Remember - BRUSH YOUR TEETH THE MORNING OF SURGERY WITH YOUR REGULAR TOOTHPASTE  Please do not use if you have an allergy to CHG or antibacterial soaps. If your skin becomes reddened/irritated stop using the CHG.  Do not shave (including legs and underarms) for at least 48 hours prior to first CHG shower. It is OK to shave your face.  Please follow these instructions carefully.   1. Shower the NIGHT BEFORE SURGERY and the MORNING OF SURGERY with CHG.   2. If you chose to wash your hair, wash your hair first as usual with your normal shampoo.  3. After you shampoo, rinse your hair and body thoroughly to remove the shampoo.  4. Use CHG as you would any other liquid soap. You can apply CHG directly to the skin and wash gently with a scrungie or a clean washcloth.   5. Apply the CHG Soap to your body ONLY FROM THE NECK DOWN.  Do not use on open wounds or open sores. Avoid contact with your eyes, ears, mouth and genitals (private parts). Wash Face and genitals (private parts)  with your normal soap.  6. Wash thoroughly, paying special attention  to the area where your surgery will be performed.  7. Thoroughly rinse your body with warm water from the neck down.  8. DO NOT shower/wash with your normal soap after using and rinsing off the CHG Soap.  9. Pat yourself dry with a CLEAN TOWEL.  10. Wear CLEAN PAJAMAS to bed the night before surgery, wear comfortable clothes the morning of surgery  11. Place CLEAN SHEETS on your bed the night of your first shower and DO NOT SLEEP WITH PETS.  Day of Surgery:  Do not apply any deodorants/lotions.  Please wear clean clothes to the hospital/surgery center.   Remember to brush your teeth WITH YOUR REGULAR  TOOTHPASTE.   Please read over the following fact sheets that you were given.

## 2018-06-22 ENCOUNTER — Encounter (HOSPITAL_COMMUNITY): Payer: Self-pay

## 2018-06-22 ENCOUNTER — Encounter (HOSPITAL_COMMUNITY): Payer: Self-pay | Admitting: Vascular Surgery

## 2018-06-22 ENCOUNTER — Other Ambulatory Visit: Payer: Self-pay

## 2018-06-22 ENCOUNTER — Encounter (HOSPITAL_COMMUNITY): Payer: Self-pay | Admitting: Anesthesiology

## 2018-06-22 ENCOUNTER — Encounter (HOSPITAL_COMMUNITY)
Admission: RE | Admit: 2018-06-22 | Discharge: 2018-06-22 | Disposition: A | Discharge status: Home / Self Care | Payer: Medicare Other | Source: Ambulatory Visit | Attending: Surgery | Admitting: Surgery

## 2018-06-22 ENCOUNTER — Telehealth (HOSPITAL_COMMUNITY): Payer: Self-pay | Admitting: *Deleted

## 2018-06-22 DIAGNOSIS — Z79899 Other long term (current) drug therapy: Secondary | ICD-10-CM | POA: Insufficient documentation

## 2018-06-22 DIAGNOSIS — I447 Left bundle-branch block, unspecified: Secondary | ICD-10-CM | POA: Insufficient documentation

## 2018-06-22 DIAGNOSIS — R9431 Abnormal electrocardiogram [ECG] [EKG]: Secondary | ICD-10-CM | POA: Insufficient documentation

## 2018-06-22 DIAGNOSIS — Z8719 Personal history of other diseases of the digestive system: Secondary | ICD-10-CM

## 2018-06-22 DIAGNOSIS — Z8711 Personal history of peptic ulcer disease: Secondary | ICD-10-CM | POA: Insufficient documentation

## 2018-06-22 DIAGNOSIS — Z01818 Encounter for other preprocedural examination: Secondary | ICD-10-CM

## 2018-06-22 DIAGNOSIS — I5022 Chronic systolic (congestive) heart failure: Secondary | ICD-10-CM

## 2018-06-22 DIAGNOSIS — Z7901 Long term (current) use of anticoagulants: Secondary | ICD-10-CM | POA: Insufficient documentation

## 2018-06-22 DIAGNOSIS — I4819 Other persistent atrial fibrillation: Secondary | ICD-10-CM | POA: Insufficient documentation

## 2018-06-22 DIAGNOSIS — Z87891 Personal history of nicotine dependence: Secondary | ICD-10-CM

## 2018-06-22 DIAGNOSIS — Z5309 Procedure and treatment not carried out because of other contraindication: Secondary | ICD-10-CM | POA: Diagnosis not present

## 2018-06-22 DIAGNOSIS — I130 Hypertensive heart and chronic kidney disease with heart failure and stage 1 through stage 4 chronic kidney disease, or unspecified chronic kidney disease: Secondary | ICD-10-CM

## 2018-06-22 DIAGNOSIS — K4091 Unilateral inguinal hernia, without obstruction or gangrene, recurrent: Secondary | ICD-10-CM

## 2018-06-22 DIAGNOSIS — Z96653 Presence of artificial knee joint, bilateral: Secondary | ICD-10-CM | POA: Insufficient documentation

## 2018-06-22 DIAGNOSIS — N189 Chronic kidney disease, unspecified: Secondary | ICD-10-CM | POA: Insufficient documentation

## 2018-06-22 LAB — CBC
HCT: 43.7 % (ref 39.0–52.0)
Hemoglobin: 14.5 g/dL (ref 13.0–17.0)
MCH: 32.7 pg (ref 26.0–34.0)
MCHC: 33.2 g/dL (ref 30.0–36.0)
MCV: 98.4 fL (ref 80.0–100.0)
Platelets: 195 10*3/uL (ref 150–400)
RBC: 4.44 MIL/uL (ref 4.22–5.81)
RDW: 14.5 % (ref 11.5–15.5)
WBC: 7.8 10*3/uL (ref 4.0–10.5)
nRBC: 0 % (ref 0.0–0.2)

## 2018-06-22 LAB — BASIC METABOLIC PANEL
Anion gap: 13 (ref 5–15)
BUN: 22 mg/dL (ref 8–23)
CO2: 28 mmol/L (ref 22–32)
Calcium: 9 mg/dL (ref 8.9–10.3)
Chloride: 92 mmol/L — ABNORMAL LOW (ref 98–111)
Creatinine, Ser: 1.15 mg/dL (ref 0.61–1.24)
Glucose, Bld: 124 mg/dL — ABNORMAL HIGH (ref 70–99)
Potassium: 4 mmol/L (ref 3.5–5.1)
Sodium: 133 mmol/L — ABNORMAL LOW (ref 135–145)

## 2018-06-22 NOTE — Telephone Encounter (Signed)
Patient in PAT - called by PA with pre-admit. Pt in AF in afib in the 116-120. BP 120s over 90-100s. Pt is asymptomatic - discussed with Roderic Palau NP - will take a coreg 3.125mg  tab when he gets home and call in at 330pm with HR/BP for further instruction - pt verbalized understanding.

## 2018-06-22 NOTE — Progress Notes (Signed)
Error

## 2018-06-22 NOTE — Progress Notes (Addendum)
PCP - Dr. Rushie Chestnut Cardiologist - DR. McLean  Chest x-ray - 10/10/17 EKG - 04/09/18 Stress Test - yrs. ago ECHO - 9/19 Cardiac Cath -   Sleep Study - na CPAP -   Fasting Blood Sugar - na Checks Blood Sugar _____ times a day  Blood Thinner Instructions: last dose eliquis 06/21/17, pt. To stop 48 hrs. Prior to surgery per [facility] [place], notes in epic Aspirin Instructions:na  Anesthesia review: ekg/cxr /cardiac hx.  Patient denies shortness of breath, fever, cough and chest pain at PAT appointment   Patient verbalized understanding of instructions that were given to them at the PAT appointment. Patient was also instructed that they will need to review over the PAT instructions again at home before surgery.   Blood pressure 122/89 left arm                            133/94 right arm

## 2018-06-22 NOTE — Telephone Encounter (Signed)
Pt called back reporting that after extra dose of carvedilol 3.125 he is now at a HR of 97 and a bp of 124/103.  Butch Penny consulted with [name] and per [name] pt is okay to stay rate controlled and continue surgery as planned.  Pt aware and was told to increase carvedilol to 6.25 tonight and proceed with surgery.  Pt voiced understanding with Butch Penny.  He will call back in the morning with HR and BP

## 2018-06-22 NOTE — Progress Notes (Addendum)
Anesthesia PAT Evaluation:  Case:  [id number] Date/Time:  06/24/18 1316   Procedures:      OPEN LEFT INGUINAL HERNIA REPAIR WITH MESH (Left ) - TAP BLOCK     INSERTION OF MESH (Left ) - TAP BLOCK   Anesthesia type:  General   Pre-op diagnosis:  RECURRENT LEFT INGUINAL HERNIA   Location:  [place] OR ROOM 02 / [place] OR   Surgeon:  Coralie Keens, MD      DISCUSSION: Patient is a 78 year old male scheduled for the above procedure.  History includes former smoker (quit '88), afib (s/p DCCV 08/14/17, s/p ablation of afib 10/06/17), moderate AS (trileaflet by 02/04/18 echo), chronic systolic CHF, left BBB, sinus bradycardia, cardiomyopathy (suspected tachy-mediated CM), carotid artery disease, HTN, GERD, cerebral artery aneurysm (s/p "clamping" '80), CKD, UGIB 01/2018 (likely due to meloxicam use, s/p PRBC; repeat EGD 02/28/18: mildly erythematous mucosa without bleeding in prepyloric region of the stomach, acquired severe stenosis with ulcerated mucosus in second portion of duodenum and was traversed after downsizing to pediatric endoscope, biopsy pathology consistent with NSAID peptic duodenitis, negative for dysplasia or malignancy). Known coronary calcifications on 10/01/17 CT coronary, study without NTG and insufficient for plaque evaluation, Ca score 686, 64% for age/sex.    Last dose Xarelto 06/21/18 PM per cardiology recommendations.  I evaluated patient after his PAT RN interview due to his history and narrow pulse pressure with vitals. He looked well. No conversational dyspnea, no chest pain, no SOB, no edema, no dizziness or syncope. He reported going to the gym and working with a trainer 3 days a week that included some strength training as well as some aerobic activity. He has been able to walk for 30 minutes on a treadmill at a fairly brisk pace without CV symptoms. He reports SOB and edema resolved following ablation. He is typically able to tell when he is in afib and had not noticed any recurrent  symptoms. However on physical exam today, lungs clear, no ankle edema, but his heart rate was ~ 120 bpm and irregular. EKG ordered and confirmed afib with RVR rate ~ 116-124 bpm. Patient could not tell that he was in afib, although felt that his heart rate was a little fast. He was without acute symptoms.   Due to his recurrent afib with RVR, I contacted staff at Dr. Claris Gladden office who in turn contacted staff at [facility]. I spoke with clinic RN Erline Levine who was in communication with Roderic Palau, NP. Patient's vitals rechecked with HR 97, BP 122/108. He had taken morning medications including amiodarone and Coreg. Per Roderic Palau, NP patient to go home and take an extra dose of Coreg 3.125 mg and recheck BP/HR with him home machine and call results by 4:00 PM today. In the interim, patient advised to seek immediate medical attention if any acute changes. (UPDATE: As of 06/22/18 4:30 PM, patient contacted the Afib Clinic and reported HR of 97 bpm and BP of 124/103. This was discussed with [name] who advised to increase carvedilol to 6.25 mg, but otherwise was okay with patient proceeding with surgery if afib was rate controlled. Patient to contact Roderic Palau, NP on 06/23/28 with follow-up vitals.  ADDENDUM 06/23/18 12:03 PM:  Notified [facility] triage nurse Abigail Butts of events at PAT and Dr. Jackalyn Lombard recommendations as of 4:30 PM on 06/22/18. Currently plans are to proceed if patient is in rate controlled afib and otherwise no new changes or symptoms.   VS: BP (!) 124/100  Pulse 80   Temp 36.4 C (Oral)   Resp 20   Ht 5\' 8"  (1.727 m)   Wt 76.5 kg   SpO2 98%   BMI 25.65 kg/m  BP 116/99-->124/100 (LUE)--> 122/89 (LUE), 133/94 (RUE). Once patient noted to be in afib with RVR ~ 115-125, vitals rechecked with BP 122/108, HR 97 (LUE). Home: BP 124/103, HR 97.   PROVIDERS: [name], MD is PCP  - [name], MD is HF cardiologist. Last visit 03/30/18.  [name], MD is EP  cardiologist. Last visit 01/11/18. He wrote, "Should he have additional difficulty with refractory afib, I would advise av nodal ablation with CRT by Dr Caryl Comes rather than repeat ablation." - Owens Loffler, MD is GI. Last visit 04/14/18.   LABS: Labs reviewed: Acceptable for surgery. He is for PT/INR on the day of surgery. (all labs ordered are listed, but only abnormal results are displayed)  Labs Reviewed  BASIC METABOLIC PANEL - Abnormal; Notable for the following components:      Result Value   Sodium 133 (*)    Chloride 92 (*)    Glucose, Bld 124 (*)    All other components within normal limits  CBC    IMAGES: CXR 10/10/17 (during admission for rapid afib with acute on chronic systolic CHF):  IMPRESSION: Worsening interstitial pulmonary edema and small bilateral pleural effusions.   EKG:  - EKG 06/22/18: Afib with RVR (116-121 bpm) with occasional PVCs or aberrantly conducted complexes. LAD. Left BBB. - EKG 03/30/18: SB at 45 bpm. Left BBB   CV: Echo (TTE) 02/04/18: Study Conclusions - Left ventricle: The cavity size was normal. Wall thickness was   normal. The estimated ejection fraction was 50%. Diffuse   hypokinesis. Features are consistent with a pseudonormal left   ventricular filling pattern, with concomitant abnormal relaxation   and increased filling pressure (grade 2 diastolic dysfunction). - Aortic valve: Trileaflet; severely calcified leaflets. There was   moderate stenosis. There was moderate regurgitation. Mean   gradient (S): 20 mm Hg. Valve area (VTI): 1.29 cm^2. - Mitral valve: There was moderate regurgitation. - Left atrium: The atrium was severely dilated. - Right ventricle: The cavity size was normal. Systolic function   was normal. - Right atrium: The atrium was moderately dilated. - Tricuspid valve: Peak RV-RA gradient (S): 55 mm Hg. - Pulmonary arteries: PA peak pressure: 70 mm Hg (S). - Systemic veins: IVC measured 2.3 cm with < 50%  respirophasic   variation, suggesting RA pressure 15 mmHg. Impressions: - Normal LV size with EF 50%, mild diffuse hypokinesis. Moderate   diastolic dysfunction. Normal RV size and systolic function.   Moderate aortic stenosis with moderate aortic insufficiency.   Moderate mitral regurgitation. Biatrial enlargement. Severe   pulmonary hypertension. Dilated IVC suggestive of elevated RV   filling pressures. (EF previously 30% by 08/14/17/TEE.)  CT coronary 10/01/17: IMPRESSION: 1. There is normal pulmonary vein drainage into the left atrium. 2. The left atrial appendage is large, broccoli type with multiple smaller lobes. There is no thrombus in the left atrial appendage. 3. The esophagus runs in the left atrial midline and is not in the proximity to any of the pulmonary veins. 4. Normal coronary origin. Right dominance. The study was performed without use of NTG and insufficient for plaque evaluation. There is a note in the chart that the patient is post CABG, however there is no evidence for prior sternotomy or bypasses. There are moderate diffuse calcifications of the coronary  arteries. Calcium score is 686 that represents 64 percentile for age/sex.  TEE/DCCV 08/14/17: Study Conclusions - Left ventricle: The estimated ejection fraction was 30%. Diffuse   hypokinesis. No evidence of thrombus. - Aortic valve: There was moderate stenosis. There was mild   regurgitation. - Left atrium: The atrium was severely dilated. No evidence of   thrombus in the atrial cavity or appendage. No evidence of   thrombus in the atrial cavity or appendage. - Atrial septum: No defect or patent foramen ovale was identified. - Impressions: 3D rendering of MV performed. Anesthesia propofol   DCC x 1 150 J converted from afib rate 120 to SB rates 55   No immediate neurologic sequelae. Impressions: - 3D rendering of MV performed. Anesthesia propofol   DCC x 1 150 J converted from afib rate 120 to SB rates  55   No immediate neurologic sequelae. Successful cardioversion. No   cardiac source of emboli was indentified. (TTE 08/13/17: AV peak and mean gradients through the valve are 16 and 11 mmHg, respectively.)  Carotid U/S 11/04/17: Final Interpretation: Right Carotid: Velocities in the right ICA are consistent with a 1-39% stenosis.                RICA velocities remain within normal range and stable compared to                the prior exam. Left Carotid: Velocities in the left ICA are consistent with a 40-59% stenosis.               The ECA appears >50% stenosed.               The LICA velocities are elevated and have increased compared to               the prior exam. Vertebrals:  Right vertebral artery demonstrates antegrade flow.              Left vertebral artery demonstrates an occlusion. Subclavians: Normal flow hemodynamics were seen in bilateral subclavian              arteries. Suggest follow up study in 12 months.   Past Medical History:  Diagnosis Date  . Arthritis   . Biatrial enlargement    severe  . Bicuspid aortic valve   . CHF (congestive heart failure) ([place])    JULY 2014  . Chronic renal insufficiency   . GERD (gastroesophageal reflux disease)   . History of cardioversion 12/16/2012  . History of stomach ulcers   . Hypertension   . Left bundle branch block   . Nonischemic cardiomyopathy ([place])       . Persistent atrial fibrillation    multiple prior cardioversion  . Sinus bradycardia   . Status post clamping of cerebral aneurysm 79    Past Surgical History:  Procedure Laterality Date  . ATRIAL FIBRILLATION ABLATION N/A 10/06/2017   Procedure: ATRIAL FIBRILLATION ABLATION;  Surgeon: [name], MD;  Location: [place] CV LAB;  Service: Cardiovascular;  Laterality: N/A;  . BIOPSY  02/06/2018   Procedure: BIOPSY;  Surgeon: Lavena Bullion, DO;  Location: [place] ENDOSCOPY;  Service: Gastroenterology;;  . BIOPSY  02/28/2018   Procedure: BIOPSY;  Surgeon:  Mauri Pole, MD;  Location: [place];  Service: Endoscopy;;  . CARDIAC CATHETERIZATION    . CARDIOVERSION N/A 12/16/2012   Procedure: CARDIOVERSION;  Surgeon: Lelon Perla, MD;  Location[facility] ENDOSCOPY;  Service: Cardiovascular;  Laterality: N/A;  . CARDIOVERSION  N/A 08/14/2017   Procedure: CARDIOVERSION;  Surgeon: Josue Hector, MD;  Location: [facility] ENDOSCOPY;  Service: Cardiovascular;  Laterality: N/A;  . CARDIOVERSION N/A 09/21/2017   Procedure: CARDIOVERSION;  Surgeon: Sanda Klein, MD;  Location: [place] ENDOSCOPY;  Service: Cardiovascular;  Laterality: N/A;  . CARDIOVERSION N/A 10/13/2017   Procedure: CARDIOVERSION;  Surgeon: Josue Hector, MD;  Location: [facility] ENDOSCOPY;  Service: Cardiovascular;  Laterality: N/A;  . CATARACT EXTRACTION  05/2015   bilateral  . cerebral anuersym post clips    . ESOPHAGOGASTRODUODENOSCOPY N/A 02/06/2018   Procedure: ESOPHAGOGASTRODUODENOSCOPY (EGD);  Surgeon: Lavena Bullion, DO;  Location: [facility] ENDOSCOPY;  Service: Gastroenterology;  Laterality: N/A;  . ESOPHAGOGASTRODUODENOSCOPY N/A 02/28/2018   Procedure: ESOPHAGOGASTRODUODENOSCOPY (EGD);  Surgeon: Mauri Pole, MD;  Location: [facility] ENDOSCOPY;  Service: Endoscopy;  Laterality: N/A;  . fractured left arm    . HERNIA REPAIR     lft  . INGUINAL HERNIA REPAIR  07/02/2011   Procedure: LAPAROSCOPIC INGUINAL HERNIA;  Surgeon: Harl Bowie, MD;  Location: [place];  Service: General;  Laterality: Left;  Laparoscopic left inguinal hernia repair and mesh  . left tendon repair     lft foot  . ROTATOR CUFF REPAIR     lf  . TEE WITHOUT CARDIOVERSION N/A 08/14/2017   Procedure: TRANSESOPHAGEAL ECHOCARDIOGRAM (TEE);  Surgeon: Josue Hector, MD;  Location: [facility] ENDOSCOPY;  Service: Cardiovascular;  Laterality: N/A;  . TONSILLECTOMY    . TOTAL KNEE ARTHROPLASTY Bilateral 02/06/2014   Procedure: TOTAL KNEE BILATERAL;  Surgeon: Mauri Pole, MD;  Location: [facility]S;  Service: Orthopedics;  Laterality:  Bilateral;  . WRIST GANGLION EXCISION     lft    MEDICATIONS: . amiodarone (PACERONE) 100 MG tablet  . Carboxymethylcell-Hypromellose (GENTEAL) 0.25-0.3 % GEL  . carvedilol (COREG) 3.125 MG tablet  . finasteride (PROPECIA) 1 MG tablet  . losartan (COZAAR) 25 MG tablet  . Multiple Vitamins-Minerals (OCUVITE PRESERVISION PO)  . pantoprazole (PROTONIX) 40 MG tablet  . rosuvastatin (CRESTOR) 5 MG tablet  . sucralfate (CARAFATE) 1 g tablet  . temazepam (RESTORIL) 15 MG capsule  . torsemide (DEMADEX) 20 MG tablet  . traMADol (ULTRAM) 50 MG tablet  . XARELTO 15 MG TABS tablet   No current facility-administered medications for this encounter.     Myra Gianotti, PA-C Surgical Short Stay/Anesthesiology [facility] Phone [phone] [facility] Phone [phone] 06/22/2018 5:24 PM

## 2018-06-23 ENCOUNTER — Other Ambulatory Visit (HOSPITAL_COMMUNITY): Payer: Self-pay | Admitting: *Deleted

## 2018-06-23 MED ORDER — CARVEDILOL 3.125 MG PO TABS: ORAL_TABLET | ORAL | 3 refills | Status: DC

## 2018-06-23 NOTE — Telephone Encounter (Signed)
Patient called in continuing coreg 6.25mg  BID until follow up with [name] next week unless he returns to normal rhythm before then - at that point he would return to 3.125mg  due to SB when in rhythm. HR was 92 BP 117/73 with current dose of 6.25mg  BID. Continue surgery as scheduled resume xarelto when deemed safe by surgeon.

## 2018-06-23 NOTE — H&P (Signed)
Travis [name] Documented: 06/01/2018 2:00 PM Location: [facility] Patient #: [id number] DOB: 21-Jul-1940 Single / Language: Travis [name] / Race: White Male   History of Present Illness (Travis [name] A. Ninfa Linden MD; 06/01/2018 2:09 PM) The patient is a 78 year old male who presents with an inguinal hernia. This gentleman presents with a left inguinal hernia. I actually performed a laparoscopic left inguinal hernia repair with mesh on him in 2013. He had a previous open repair of the left inguinal hernia 30 years prior to that. He reports he is doing well until about a month ago when he noticed a recurrent hernia. It is been mildly painful and difficult to reduce at times but he has had no effective symptoms. He is on blood thinning medication for chronic A. fib. He is otherwise been doing well.   Past Surgical History Travis [name], CMA; 06/01/2018 2:00 PM) Aneurysm Repair  Cataract Surgery  Bilateral. Foot Surgery  Left. Knee Surgery  Bilateral. Laparoscopic Inguinal Hernia Surgery  Right. multiple Shoulder Surgery  Left. Tonsillectomy   Diagnostic Studies History Travis [name], CMA; 06/01/2018 2:00 PM) Colonoscopy  5-10 years ago  Allergies Travis [name], CMA; 06/01/2018 2:00 PM) No Known Drug Allergies [06/01/2018]:  Medication History Travis [name], CMA; 06/01/2018 2:01 PM) Temazepam (15MG  Capsule, Oral) Active. traMADol HCl (50MG  Tablet, Oral) Active. Amiodarone HCl (200MG  Tablet, Oral) Active. Carvedilol (3.125MG  Tablet, Oral) Active. Finasteride (1MG  Tablet, Oral) Active. Losartan Potassium (25MG  Tablet, Oral) Active. Pantoprazole Sodium (40MG  Tablet DR, Oral) Active. Rosuvastatin Calcium (5MG  Tablet, Oral) Active. Torsemide (20MG  Tablet, Oral) Active. Xarelto (15MG  Tablet, Oral) Active. Medications Reconciled  Social History Travis [name], CMA; 06/01/2018 2:00 PM) Alcohol use  Occasional alcohol use. Caffeine use  Carbonated beverages,  Coffee. No drug use  Tobacco use  Former smoker.  Family History Travis [name], Travis [name]; 06/01/2018 2:00 PM) Alcohol Abuse  Father. Cerebrovascular Accident  Father, Mother. Depression  Father.  Other Problems Travis [name], CMA; 06/01/2018 2:00 PM) Arthritis  Atrial Fibrillation  Gastric Ulcer  High blood pressure  Inguinal Hernia     Review of Systems (Travis [name] CMA; 06/01/2018 2:00 PM) General Not Present- Appetite Loss, Chills, Fatigue, Fever, Night Sweats, Weight Gain and Weight Loss. Skin Not Present- Change in Wart/Mole, Dryness, Hives, Jaundice, New Lesions, Non-Healing Wounds, Rash and Ulcer. HEENT Not Present- Earache, Hearing Loss, Hoarseness, Nose Bleed, Oral Ulcers, Ringing in the Ears, Seasonal Allergies, Sinus Pain, Sore Throat, Visual Disturbances, Wears glasses/contact lenses and Yellow Eyes. Respiratory Not Present- Bloody sputum, Chronic Cough, Difficulty Breathing, Snoring and Wheezing. Breast Not Present- Breast Mass, Breast Pain, Nipple Discharge and Skin Changes. Cardiovascular Not Present- Chest Pain, Difficulty Breathing Lying Down, Leg Cramps, Palpitations, Rapid Heart Rate, Shortness of Breath and Swelling of Extremities. Gastrointestinal Not Present- Abdominal Pain, Bloating, Bloody Stool, Change in Bowel Habits, Chronic diarrhea, Constipation, Difficulty Swallowing, Excessive gas, Gets full quickly at meals, Hemorrhoids, Indigestion, Nausea, Rectal Pain and Vomiting. Male Genitourinary Not Present- Blood in Urine, Change in Urinary Stream, Frequency, Impotence, Nocturia, Painful Urination, Urgency and Urine Leakage. Musculoskeletal Not Present- Back Pain, Joint Pain, Joint Stiffness, Muscle Pain, Muscle Weakness and Swelling of Extremities. Neurological Not Present- Decreased Memory, Fainting, Headaches, Numbness, Seizures, Tingling, Tremor, Trouble walking and Weakness. Endocrine Not Present- Cold Intolerance, Excessive Hunger, Hair Changes, Heat  Intolerance, Hot flashes and New Diabetes. Hematology Not Present- Blood Thinners, Easy Bruising, Excessive bleeding, Gland problems, HIV and Persistent Infections.  Vitals (Travis [name] CMA; 06/01/2018 2:00 PM) 06/01/2018 2:00 PM Weight: 168.8 lb Height:  68in Body Surface Area: 1.9 m Body Mass Index: 25.67 kg/m  Pulse: 73 (Regular)  BP: 108/70 (Sitting, Left Arm, Standard)       Physical Exam (Travis [name] A. Ninfa Linden MD; 06/01/2018 2:10 PM) General Mental Status-Alert. General Appearance-Consistent with stated age. Hydration-Well hydrated. Voice-Normal.  Head and Neck Head-normocephalic, atraumatic with no lesions or palpable masses. Trachea-midline.  Eye Eyeball - Bilateral-Extraocular movements intact. Sclera/Conjunctiva - Bilateral-No scleral icterus.  Chest and Lung Exam Chest and lung exam reveals -quiet, even and easy respiratory effort with no use of accessory muscles and on auscultation, normal breath sounds, no adventitious sounds and normal vocal resonance. Inspection Chest Wall - Normal. Back - normal.  Cardiovascular Cardiovascular examination reveals -normal heart sounds, regular rate and rhythm with no murmurs and normal pedal pulses bilaterally.  Abdomen Inspection Skin - Scar - no surgical scars. Hernias - Inguinal hernia - Left - Reducible. Note: This is a somewhat difficult to reduce but reducible left inguinal hernia. There is no evidence of right inguinal hernia. Palpation/Percussion Palpation and Percussion of the abdomen reveal - Soft, Non Tender, No Rebound tenderness, No Rigidity (guarding) and No hepatosplenomegaly. Auscultation Auscultation of the abdomen reveals - Bowel sounds normal.  Neurologic - Did not examine.  Musculoskeletal - Did not examine.    Assessment & Plan (Travis [name] A. Ninfa Linden MD; 06/01/2018 2:11 PM) RECURRENT LEFT INGUINAL HERNIA (K40.91) Impression: He is fairly symptomatic so an open right inguinal  hernia repair with mesh was recommended. I discussed this with him in detail. I would need him to stop his blood thinning medication for approximately 1-2 days preoperatively. We would do this with a tap block by anesthesiology. I discussed the risks of the procedure in detail. We discussed use of mesh. We discussed the risk which includes but is not limited to bleeding, infection, injury to surrounding structures, hernia recurrence, cardiopulmonary she is, etc. He understands and wishes to proceed with surgery

## 2018-06-23 NOTE — Anesthesia Preprocedure Evaluation (Deleted)
Anesthesia Evaluation  Patient identified by MRN, date of birth, ID band Patient awake    Reviewed: Allergy & Precautions, NPO status , Patient's Chart, lab work & pertinent test results  Airway        Dental   Pulmonary neg pulmonary ROS, former smoker,           Cardiovascular hypertension, Pt. on medications + dysrhythmias Atrial Fibrillation + Valvular Problems/Murmurs AS, AI and MR   LBBB   Neuro/Psych negative neurological ROS  negative psych ROS   GI/Hepatic Neg liver ROS, GERD  ,  Endo/Other  negative endocrine ROS  Renal/GU negative Renal ROS  negative genitourinary   Musculoskeletal negative musculoskeletal ROS (+)   Abdominal   Peds negative pediatric ROS (+)  Hematology negative hematology ROS (+)   Anesthesia Other Findings   Reproductive/Obstetrics negative OB ROS                           Anesthesia Physical Anesthesia Plan  ASA:   Anesthesia Plan:    Post-op Pain Management:    Induction:   PONV Risk Score and Plan:   Airway Management Planned:   Additional Equipment:   Intra-op Plan:   Post-operative Plan:   Informed Consent:   Plan Discussed with:   Anesthesia Plan Comments: (See PAT note written 06/23/2018 by Myra Gianotti, PA-C. Patient found to be in recurrent afib, rate ~ 100-120's at 06/22/18 PAT. [name] increased Coreg and felt okay for patient to proceed if rate controlled. Follow-up HR < 100.   06/24/18 Patient still in afib with RVR in pre-op. Hr's 120's. Surgery cancelled for rate control follow up with cardiologist. )       Anesthesia Quick Evaluation

## 2018-06-24 ENCOUNTER — Encounter (HOSPITAL_COMMUNITY)
Admission: RE | Disposition: A | Discharge status: Home / Self Care | Payer: Self-pay | Source: Home / Self Care | Attending: Surgery

## 2018-06-24 ENCOUNTER — Encounter (HOSPITAL_COMMUNITY): Payer: Self-pay

## 2018-06-24 ENCOUNTER — Ambulatory Visit (HOSPITAL_COMMUNITY)
Admission: RE | Admit: 2018-06-24 | Discharge: 2018-06-24 | Disposition: A | Discharge status: Home / Self Care | Payer: Medicare Other | Attending: Surgery | Admitting: Surgery

## 2018-06-24 ENCOUNTER — Other Ambulatory Visit: Payer: Self-pay

## 2018-06-24 DIAGNOSIS — Z5309 Procedure and treatment not carried out because of other contraindication: Secondary | ICD-10-CM | POA: Diagnosis not present

## 2018-06-24 DIAGNOSIS — K4091 Unilateral inguinal hernia, without obstruction or gangrene, recurrent: Secondary | ICD-10-CM | POA: Insufficient documentation

## 2018-06-24 LAB — PROTIME-INR
INR: 1.05
Prothrombin Time: 13.6 s (ref 11.4–15.2)

## 2018-06-24 SURGERY — CANCELLED PROCEDURE

## 2018-06-24 MED ORDER — CHLORHEXIDINE GLUCONATE CLOTH 2 % EX PADS: 6.0000 | MEDICATED_PAD | Freq: Once | CUTANEOUS | Status: DC

## 2018-06-24 MED ORDER — CEFAZOLIN SODIUM-DEXTROSE 2-4 GM/100ML-% IV SOLN: 2.0000 g | INTRAVENOUS | Status: DC

## 2018-06-24 MED ORDER — GABAPENTIN 300 MG PO CAPS
300.0000 mg | ORAL_CAPSULE | ORAL | Status: AC
  Administered 2018-06-24: 300 mg via ORAL

## 2018-06-24 MED ORDER — LACTATED RINGERS IV SOLN
INTRAVENOUS | Status: DC
  Administered 2018-06-24: 12:00:00 via INTRAVENOUS

## 2018-06-24 MED ORDER — ACETAMINOPHEN 500 MG PO TABS
ORAL_TABLET | ORAL | Status: AC
  Administered 2018-06-24: 1000 mg via ORAL
  Filled 2018-06-24: qty 2

## 2018-06-24 MED ORDER — ACETAMINOPHEN 500 MG PO TABS
1000.0000 mg | ORAL_TABLET | ORAL | Status: AC
  Administered 2018-06-24: 1000 mg via ORAL

## 2018-06-24 MED ORDER — CEFAZOLIN SODIUM-DEXTROSE 2-4 GM/100ML-% IV SOLN
INTRAVENOUS | Status: AC
  Filled 2018-06-24: qty 100

## 2018-06-24 MED ORDER — GABAPENTIN 300 MG PO CAPS
ORAL_CAPSULE | ORAL | Status: AC
  Administered 2018-06-24: 300 mg via ORAL
  Filled 2018-06-24: qty 1

## 2018-06-24 SURGICAL SUPPLY — 33 items
ADH SKN CLS APL DERMABOND .7 (GAUZE/BANDAGES/DRESSINGS) ×1
BLADE CLIPPER SURG (BLADE) IMPLANT
CHLORAPREP W/TINT 26ML (MISCELLANEOUS) ×4 IMPLANT
COVER SURGICAL LIGHT HANDLE (MISCELLANEOUS) ×4 IMPLANT
COVER WAND RF STERILE (DRAPES) IMPLANT
DERMABOND ADVANCED (GAUZE/BANDAGES/DRESSINGS) ×2
DERMABOND ADVANCED .7 DNX12 (GAUZE/BANDAGES/DRESSINGS) ×2 IMPLANT
DRAIN PENROSE 1/2X12 LTX STRL (WOUND CARE) IMPLANT
DRAPE LAPAROTOMY TRNSV 102X78 (DRAPE) ×4 IMPLANT
ELECT REM PT RETURN 9FT ADLT (ELECTROSURGICAL) ×3
ELECTRODE REM PT RTRN 9FT ADLT (ELECTROSURGICAL) ×2 IMPLANT
GLOVE SURG SIGNA 7.5 PF LTX (GLOVE) ×4 IMPLANT
GOWN STRL REUS W/ TWL LRG LVL3 (GOWN DISPOSABLE) ×2 IMPLANT
GOWN STRL REUS W/ TWL XL LVL3 (GOWN DISPOSABLE) ×2 IMPLANT
GOWN STRL REUS W/TWL LRG LVL3 (GOWN DISPOSABLE) ×3
GOWN STRL REUS W/TWL XL LVL3 (GOWN DISPOSABLE) ×3
KIT BASIN OR (CUSTOM PROCEDURE TRAY) ×4 IMPLANT
KIT TURNOVER KIT B (KITS) ×4 IMPLANT
NDL HYPO 25GX1X1/2 BEV (NEEDLE) ×1 IMPLANT
NEEDLE HYPO 25GX1X1/2 BEV (NEEDLE) ×3 IMPLANT
NS IRRIG 1000ML POUR BTL (IV SOLUTION) ×4 IMPLANT
PACK GENERAL/GYN (CUSTOM PROCEDURE TRAY) ×4 IMPLANT
PAD ARMBOARD 7.5X6 YLW CONV (MISCELLANEOUS) ×4 IMPLANT
PENCIL SMOKE EVACUATOR (MISCELLANEOUS) ×4 IMPLANT
SUT MON AB 4-0 PC3 18 (SUTURE) ×4 IMPLANT
SUT SILK 2 0 SH (SUTURE) IMPLANT
SUT VIC AB 2-0 CT1 27 (SUTURE) ×3
SUT VIC AB 2-0 CT1 TAPERPNT 27 (SUTURE) ×2 IMPLANT
SUT VIC AB 3-0 CT1 27 (SUTURE) ×3
SUT VIC AB 3-0 CT1 TAPERPNT 27 (SUTURE) ×2 IMPLANT
SYR CONTROL 10ML LL (SYRINGE) ×4 IMPLANT
TOWEL OR 17X24 6PK STRL BLUE (TOWEL DISPOSABLE) ×4 IMPLANT
TOWEL OR 17X26 10 PK STRL BLUE (TOWEL DISPOSABLE) ×4 IMPLANT

## 2018-06-24 NOTE — Progress Notes (Signed)
Pt's HR in the 120's.    Per Dr. Marcell Barlow, cancelling surgery due to elevated HR.  Pt instructed to follow up with Cardiologist.    Pt's ride called and coming back to pick him up.    Pt stated understanding of instructions to follow up with Cardiologist.

## 2018-06-24 NOTE — Progress Notes (Signed)
Patient ID: Travis [name], male   DOB: 01/06/1941, 78 y.o.   MRN: [id number]   Pt now with sustained HR in 120"s. Not safe for anesthesia today.  Agree with Anesthesiology that pt needs further adjustment with cardiology. Will reschedule

## 2018-06-25 ENCOUNTER — Encounter (HOSPITAL_COMMUNITY): Payer: Self-pay | Admitting: Nurse Practitioner

## 2018-06-25 ENCOUNTER — Other Ambulatory Visit (HOSPITAL_COMMUNITY): Payer: Self-pay | Admitting: Nurse Practitioner

## 2018-06-25 ENCOUNTER — Ambulatory Visit (HOSPITAL_COMMUNITY)
Admission: RE | Admit: 2018-06-25 | Discharge: 2018-06-25 | Disposition: A | Discharge status: Home / Self Care | Payer: Medicare Other | Source: Ambulatory Visit | Attending: Nurse Practitioner | Admitting: Nurse Practitioner

## 2018-06-25 VITALS — BP 122/68 | HR 113 | Ht 68.0 in | Wt 168.0 lb

## 2018-06-25 DIAGNOSIS — Z96653 Presence of artificial knee joint, bilateral: Secondary | ICD-10-CM | POA: Diagnosis not present

## 2018-06-25 DIAGNOSIS — I110 Hypertensive heart disease with heart failure: Secondary | ICD-10-CM | POA: Insufficient documentation

## 2018-06-25 DIAGNOSIS — Z79899 Other long term (current) drug therapy: Secondary | ICD-10-CM | POA: Insufficient documentation

## 2018-06-25 DIAGNOSIS — M19049 Primary osteoarthritis, unspecified hand: Secondary | ICD-10-CM | POA: Diagnosis not present

## 2018-06-25 DIAGNOSIS — Z801 Family history of malignant neoplasm of trachea, bronchus and lung: Secondary | ICD-10-CM | POA: Insufficient documentation

## 2018-06-25 DIAGNOSIS — I509 Heart failure, unspecified: Secondary | ICD-10-CM | POA: Diagnosis not present

## 2018-06-25 DIAGNOSIS — I480 Paroxysmal atrial fibrillation: Secondary | ICD-10-CM | POA: Diagnosis not present

## 2018-06-25 DIAGNOSIS — K219 Gastro-esophageal reflux disease without esophagitis: Secondary | ICD-10-CM | POA: Insufficient documentation

## 2018-06-25 DIAGNOSIS — Z7901 Long term (current) use of anticoagulants: Secondary | ICD-10-CM | POA: Diagnosis not present

## 2018-06-25 DIAGNOSIS — I4819 Other persistent atrial fibrillation: Secondary | ICD-10-CM | POA: Diagnosis not present

## 2018-06-25 DIAGNOSIS — M4692 Unspecified inflammatory spondylopathy, cervical region: Secondary | ICD-10-CM | POA: Diagnosis not present

## 2018-06-25 DIAGNOSIS — I447 Left bundle-branch block, unspecified: Secondary | ICD-10-CM | POA: Insufficient documentation

## 2018-06-25 DIAGNOSIS — R9431 Abnormal electrocardiogram [ECG] [EKG]: Secondary | ICD-10-CM | POA: Diagnosis not present

## 2018-06-25 DIAGNOSIS — I428 Other cardiomyopathies: Secondary | ICD-10-CM | POA: Insufficient documentation

## 2018-06-25 DIAGNOSIS — Q231 Congenital insufficiency of aortic valve: Secondary | ICD-10-CM | POA: Diagnosis not present

## 2018-06-25 DIAGNOSIS — Z87891 Personal history of nicotine dependence: Secondary | ICD-10-CM | POA: Diagnosis not present

## 2018-06-25 MED ORDER — CARVEDILOL 12.5 MG PO TABS: 12.5000 mg | ORAL_TABLET | Freq: Two times a day (BID) | ORAL | 2 refills | Status: DC

## 2018-06-25 NOTE — Patient Instructions (Signed)
Coreg 12.5 mg twice a day.

## 2018-06-25 NOTE — Progress Notes (Signed)
Primary Care Physician: [name], MD Referring Physician: Surgical f/u   Travis [name] is a 78 y.o. male with a h/o afib on amiodarone, s/p ablation that is in the afib clinic for presenting with afib with RVR at time of elective  hernia surgery on Thursday. Surgery was cancelled and he was referred to afib clinic. He was off xarelto from Tuesday/Wednesday and restarted yesterday pm.  I discussed with [name] and he feels if he is rate controlled, he can go on with surgery. He has v rate of 113 bpm today. Pt feels that he went into afib  2/2 recently starting a new statin drug, which he has since stopped.   Today, he denies symptoms of palpitations, chest pain, shortness of breath, orthopnea, PND, lower extremity edema, dizziness, presyncope, syncope, or neurologic sequela. The patient is tolerating medications without difficulties and is otherwise without complaint today.   Past Medical History:  Diagnosis Date  . Arthritis   . Biatrial enlargement    severe  . Bicuspid aortic valve   . CHF (congestive heart failure) ([facility])    JULY 2014  . Chronic renal insufficiency   . GERD (gastroesophageal reflux disease)   . History of cardioversion 12/16/2012  . History of stomach ulcers   . Hypertension   . Left bundle branch block   . Nonischemic cardiomyopathy ([place])       . Persistent atrial fibrillation    multiple prior cardioversion  . Sinus bradycardia   . Status post clamping of cerebral aneurysm 79   Past Surgical History:  Procedure Laterality Date  . ATRIAL FIBRILLATION ABLATION N/A 10/06/2017   Procedure: ATRIAL FIBRILLATION ABLATION;  Surgeon: [name], MD;  Location: [facility];  Service: Cardiovascular;  Laterality: N/A;  . BIOPSY  02/06/2018   Procedure: BIOPSY;  Surgeon: Lavena Bullion, DO;  Location: [facility];  Service: Gastroenterology;;  . BIOPSY  02/28/2018   Procedure: BIOPSY;  Surgeon: Mauri Pole, MD;  Location: [facility];   Service: Endoscopy;;  . CARDIAC CATHETERIZATION    . CARDIOVERSION N/A 12/16/2012   Procedure: CARDIOVERSION;  Surgeon: Lelon Perla, MD;  Location: [facility]COPY;  Service: Cardiovascular;  Laterality: N/A;  . CARDIOVERSION N/A 08/14/2017   Procedure: CARDIOVERSION;  Surgeon: Josue Hector, MD;  Location: [facility] ENDOSCOPY;  Service: Cardiovascular;  Laterality: N/A;  . CARDIOVERSION N/A 09/21/2017   Procedure: CARDIOVERSION;  Surgeon: Sanda Klein, MD;  Location: [facility];  Service: Cardiovascular;  Laterality: N/A;  . CARDIOVERSION N/A 10/13/2017   Procedure: CARDIOVERSION;  Surgeon: Josue Hector, MD;  Location: [facility];  Service: Cardiovascular;  Laterality: N/A;  . CATARACT EXTRACTION  05/2015   bilateral  . cerebral anuersym post clips    . ESOPHAGOGASTRODUODENOSCOPY N/A 02/06/2018   Procedure: ESOPHAGOGASTRODUODENOSCOPY (EGD);  Surgeon: Lavena Bullion, DO;  Location: [facility] ENDOSCOPY;  Service: Gastroenterology;  Laterality: N/A;  . ESOPHAGOGASTRODUODENOSCOPY N/A 02/28/2018   Procedure: ESOPHAGOGASTRODUODENOSCOPY (EGD);  Surgeon: Mauri Pole, MD;  Location: [facility] ENDOSCOPY;  Service: Endoscopy;  Laterality: N/A;  . fractured left arm    . HERNIA REPAIR     lft  . INGUINAL HERNIA REPAIR  07/02/2011   Procedure: LAPAROSCOPIC INGUINAL HERNIA;  Surgeon: Harl Bowie, MD;  Location: [facility];  Service: General;  Laterality: Left;  Laparoscopic left inguinal hernia repair and mesh  . left tendon repair     lft foot  . ROTATOR CUFF REPAIR     lf  .  TEE WITHOUT CARDIOVERSION N/A 08/14/2017   Procedure: TRANSESOPHAGEAL ECHOCARDIOGRAM (TEE);  Surgeon: Josue Hector, MD;  Location: [facility] ENDOSCOPY;  Service: Cardiovascular;  Laterality: N/A;  . TONSILLECTOMY    . TOTAL KNEE ARTHROPLASTY Bilateral 02/06/2014   Procedure: TOTAL KNEE BILATERAL;  Surgeon: Mauri Pole, MD;  Location: [facility];  Service: Orthopedics;  Laterality: Bilateral;  . WRIST GANGLION EXCISION     lft     Current Outpatient Medications  Medication Sig Dispense Refill  . amiodarone (PACERONE) 100 MG tablet Take 1 tablet (100 mg total) by mouth daily.    . Carboxymethylcell-Hypromellose (GENTEAL) 0.25-0.3 % GEL Place 1 drop into both eyes daily as needed (dry eyes).     . finasteride (PROPECIA) 1 MG tablet TAKE 1 TABLET(1 MG) BY MOUTH DAILY (Patient taking differently: Take 1 mg by mouth daily. ) 90 tablet 1  . losartan (COZAAR) 25 MG tablet Take 0.5 tablets (12.5 mg total) by mouth daily. 15 tablet 3  . Multiple Vitamins-Minerals (OCUVITE PRESERVISION PO) Take 1 tablet by mouth 2 (two) times daily.    . pantoprazole (PROTONIX) 40 MG tablet Take 1 tablet (40 mg total) by mouth 2 (two) times daily. 60 tablet 2  . temazepam (RESTORIL) 15 MG capsule TAKE 1 CAPSULE BY MOUTH EVERY DAY AT BEDTIME AS NEEDED 30 capsule 5  . torsemide (DEMADEX) 20 MG tablet Take 1-2 tablets (20-40 mg total) by mouth See admin instructions. Take 2 tablets (40 mg) by mouth every morning and 1 tablet (20 mg) at night 90 tablet 3  . traMADol (ULTRAM) 50 MG tablet Take 1 tablet (50 mg total) by mouth every 6 (six) hours as needed for moderate pain or severe pain (arthritis -hands and neck. 1 month RX). (Patient taking differently: Take 50 mg by mouth 3 (three) times daily. ) 60 tablet 5  . XARELTO 15 MG TABS tablet TAKE 1 TABLET(15 MG) BY MOUTH DAILY WITH SUPPER 60 tablet 6  . carvedilol (COREG) 12.5 MG tablet TAKE 1 TABLET BY MOUTH TWICE DAILY WITH MEALS 28 tablet 0   No current facility-administered medications for this encounter.     No Known Allergies  Social History   Socioeconomic History  . Marital status: Single    Spouse name: Not on file  . Number of children: 0  . Years of education: Not on file  . Highest education level: Not on file  Occupational History  . Occupation: Retired  Scientific laboratory technician  . Financial resource strain: Not on file  . Food insecurity:    Worry: Not on file    Inability: Not on file   . Transportation needs:    Medical: Not on file    Non-medical: Not on file  Tobacco Use  . Smoking status: Former Smoker    Packs/day: 1.50    Years: 25.00    Pack years: 37.50    Last attempt to quit: 05/26/1986    Years since quitting: 32.1  . Smokeless tobacco: Never Used  Substance and Sexual Activity  . Alcohol use: Not Currently    Alcohol/week: 1.0 standard drinks    Types: 1 Standard drinks or equivalent per week    Comment: stopped drinking   . Drug use: No  . Sexual activity: Not Currently  Lifestyle  . Physical activity:    Days per week: Not on file    Minutes per session: Not on file  . Stress: Not on file  Relationships  . Social connections:    Talks  on phone: Not on file    Gets together: Not on file    Attends religious service: Not on file    Active member of club or organization: Not on file    Attends meetings of clubs or organizations: Not on file    Relationship status: Not on file  . Intimate partner violence:    Fear of current or ex partner: Not on file    Emotionally abused: Not on file    Physically abused: Not on file    Forced sexual activity: Not on file  Other Topics Concern  . Not on file  Social History Narrative   Homosexual. Lives alone. Not sexually active.       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 Stroke Father 64       smoker  . Lung cancer Mother 31       former smoker  . Stroke Mother   . Stroke Brother   . Colon cancer Neg Hx     ROS- All systems are reviewed and negative except as per the HPI above  Physical Exam: Vitals:   06/25/18 1050  BP: 122/68  Pulse: (!) 113  Weight: 76.2 kg  Height: 5\' 8"  (1.727 m)   Wt Readings from Last 3 Encounters:  06/25/18 76.2 kg  06/24/18 74.8 kg  06/22/18 76.5 kg    Labs: Lab Results  Component Value Date   NA 133 (L)  06/22/2018   K 4.0 06/22/2018   CL 92 (L) 06/22/2018   CO2 28 06/22/2018   GLUCOSE 124 (H) 06/22/2018   BUN 22 06/22/2018   CREATININE 1.15 06/22/2018   CALCIUM 9.0 06/22/2018   MG 2.3 10/23/2017   Lab Results  Component Value Date   INR 1.05 06/24/2018   Lab Results  Component Value Date   CHOL 170 02/04/2018   HDL 56 02/04/2018   LDLCALC 104 (H) 02/04/2018   TRIG 50 02/04/2018     GEN- The patient is well appearing, alert and oriented x 3 today.   Head- normocephalic, atraumatic Eyes-  Sclera clear, conjunctiva pink Ears- hearing intact Oropharynx- clear Neck- supple, no JVP Lymph- no cervical lymphadenopathy Lungs- Clear to ausculation bilaterally, normal work of breathing Heart- irregular rate and rhythm, no murmurs, rubs or gallops, PMI not laterally displaced GI- soft, NT, ND, + BS Extremities- no clubbing, cyanosis, or edema MS- no significant deformity or atrophy Skin- no rash or lesion Psych- euthymic mood, full affect Neuro- strength and sensation are intact  EKG-afib at 113 bpm, LBBB,    Assessment and Plan: 1. Persistent  afib  Will increase coreg to 12.5 mg bid, he had increased to 6.25 mg bid prior to surgery for 2 days and was rate controlled at home in the 90's prior to surgery  He has restarted xarelto since yesterday after being off x 2 days He will return on Monday and if he is rate controlled can proceed to surgery and will try to restore SR after surgery, if he continues in afib Will not increase amio at this point since he had been off anticoagulation recently   Butch Penny C. Areyana Leoni, [facility] [address] [place], [place] [address] [phone]

## 2018-06-28 ENCOUNTER — Encounter (HOSPITAL_COMMUNITY): Payer: Self-pay | Admitting: Nurse Practitioner

## 2018-06-28 ENCOUNTER — Ambulatory Visit (HOSPITAL_COMMUNITY)
Admission: RE | Admit: 2018-06-28 | Discharge: 2018-06-28 | Disposition: A | Discharge status: Home / Self Care | Payer: Medicare Other | Source: Ambulatory Visit | Attending: Nurse Practitioner | Admitting: Nurse Practitioner

## 2018-06-28 VITALS — BP 110/76 | HR 97 | Ht 68.0 in | Wt 168.0 lb

## 2018-06-28 DIAGNOSIS — Z7901 Long term (current) use of anticoagulants: Secondary | ICD-10-CM | POA: Diagnosis not present

## 2018-06-28 DIAGNOSIS — I4819 Other persistent atrial fibrillation: Secondary | ICD-10-CM | POA: Insufficient documentation

## 2018-06-28 DIAGNOSIS — Z9889 Other specified postprocedural states: Secondary | ICD-10-CM | POA: Diagnosis not present

## 2018-06-28 DIAGNOSIS — K219 Gastro-esophageal reflux disease without esophagitis: Secondary | ICD-10-CM | POA: Insufficient documentation

## 2018-06-28 DIAGNOSIS — Z96653 Presence of artificial knee joint, bilateral: Secondary | ICD-10-CM | POA: Diagnosis not present

## 2018-06-28 DIAGNOSIS — N183 Chronic kidney disease, stage 3 (moderate): Secondary | ICD-10-CM | POA: Insufficient documentation

## 2018-06-28 DIAGNOSIS — I447 Left bundle-branch block, unspecified: Secondary | ICD-10-CM | POA: Insufficient documentation

## 2018-06-28 DIAGNOSIS — Z79899 Other long term (current) drug therapy: Secondary | ICD-10-CM | POA: Diagnosis not present

## 2018-06-28 DIAGNOSIS — Z87891 Personal history of nicotine dependence: Secondary | ICD-10-CM | POA: Insufficient documentation

## 2018-06-28 DIAGNOSIS — I4439 Other atrioventricular block: Secondary | ICD-10-CM | POA: Insufficient documentation

## 2018-06-28 DIAGNOSIS — I4892 Unspecified atrial flutter: Secondary | ICD-10-CM | POA: Diagnosis not present

## 2018-06-28 DIAGNOSIS — I484 Atypical atrial flutter: Secondary | ICD-10-CM | POA: Diagnosis not present

## 2018-06-28 DIAGNOSIS — I509 Heart failure, unspecified: Secondary | ICD-10-CM | POA: Insufficient documentation

## 2018-06-28 DIAGNOSIS — I130 Hypertensive heart and chronic kidney disease with heart failure and stage 1 through stage 4 chronic kidney disease, or unspecified chronic kidney disease: Secondary | ICD-10-CM | POA: Diagnosis not present

## 2018-06-28 DIAGNOSIS — I428 Other cardiomyopathies: Secondary | ICD-10-CM | POA: Diagnosis not present

## 2018-06-28 MED ORDER — CARVEDILOL 12.5 MG PO TABS: 25.0000 mg | ORAL_TABLET | Freq: Two times a day (BID) | ORAL | 0 refills | Status: DC

## 2018-06-28 NOTE — Progress Notes (Signed)
Primary Care Physician: [name], MD Referring Physician: Surgical f/u AHF- Dr. Fredda Hammed ACY ORSAK is a 78 y.o. male with a h/o afib on amiodarone, s/p ablation that is in the afib clinic for presenting with afib with RVR at time of elective  hernia surgery on Thursday. Surgery was cancelled and he was referred to afib clinic. He was off xarelto from Tuesday and restarted Thursday pm.  I discussed with [name] and he felt if he was rate controlled, he could go on with surgery. He had v rate of 113-120 bpm . Pt felt that he went into afib  2/2 recently starting a new statin drug, which he has since stopped. Carvedilol was increased to 12.5 mg bid on Friday.  He returns today and has HR of 97 bpm, still with aflutter with variable rate.  V rate improved but would like for it to be better controlled for surgery. We discussed delaying surgery and pursing TEE DCCV, but he would not be albe to interrupt anticoagulation x 4 weeks and he would like to get surgery over with.  Today, he denies symptoms of palpitations, chest pain, shortness of breath, orthopnea, PND, lower extremity edema, dizziness, presyncope, syncope, or neurologic sequela. The patient is tolerating medications without difficulties and is otherwise without complaint today.   Past Medical History:  Diagnosis Date  . Arthritis   . Biatrial enlargement    severe  . Bicuspid aortic valve   . CHF (congestive heart failure) ([place])    JULY 2014  . Chronic renal insufficiency   . GERD (gastroesophageal reflux disease)   . History of cardioversion 12/16/2012  . History of stomach ulcers   . Hypertension   . Left bundle branch block   . Nonischemic cardiomyopathy ([place])       . Persistent atrial fibrillation    multiple prior cardioversion  . Sinus bradycardia   . Status post clamping of cerebral aneurysm 79   Past Surgical History:  Procedure Laterality Date  . ATRIAL FIBRILLATION ABLATION N/A 10/06/2017   Procedure: ATRIAL FIBRILLATION ABLATION;  Surgeon: [name], MD;  Location: [place] [facility];  Service: Cardiovascular;  Laterality: N/A;  . BIOPSY  02/06/2018   Procedure: BIOPSY;  Surgeon: Lavena Bullion, DO;  Location: [place];  Service: Gastroenterology;;  . BIOPSY  02/28/2018   Procedure: BIOPSY;  Surgeon: Mauri Pole, MD;  Location: [facility];  Service: Endoscopy;;  . CARDIAC CATHETERIZATION    . CARDIOVERSION N/A 12/16/2012   Procedure: CARDIOVERSION;  Surgeon: Lelon Perla, MD;  Location: [facility] ENDOSCOPY;  Service: Cardiovascular;  Laterality: N/A;  . CARDIOVERSION N/A 08/14/2017   Procedure: CARDIOVERSION;  Surgeon: Josue Hector, MD;  Location: [facility] ENDOSCOPY;  Service: Cardiovascular;  Laterality: N/A;  . CARDIOVERSION N/A 09/21/2017   Procedure: CARDIOVERSION;  Surgeon: Sanda Klein, MD;  Location: [place] ENDOSCOPY;  Service: Cardiovascular;  Laterality: N/A;  . CARDIOVERSION N/A 10/13/2017   Procedure: CARDIOVERSION;  Surgeon: Josue Hector, MD;  Location: [place];  Service: Cardiovascular;  Laterality: N/A;  . CATARACT EXTRACTION  05/2015   bilateral  . cerebral anuersym post clips    . ESOPHAGOGASTRODUODENOSCOPY N/A 02/06/2018   Procedure: ESOPHAGOGASTRODUODENOSCOPY (EGD);  Surgeon: Lavena Bullion, DO;  Location: [facility] ENDOSCOPY;  Service: Gastroenterology;  Laterality: N/A;  . ESOPHAGOGASTRODUODENOSCOPY N/A 02/28/2018   Procedure: ESOPHAGOGASTRODUODENOSCOPY (EGD);  Surgeon: Mauri Pole, MD;  Location: [facility] ENDOSCOPY;  Service: Endoscopy;  Laterality: N/A;  . fractured left arm    .  HERNIA REPAIR     lft  . INGUINAL HERNIA REPAIR  07/02/2011   Procedure: LAPAROSCOPIC INGUINAL HERNIA;  Surgeon: Harl Bowie, MD;  Location: [place];  Service: General;  Laterality: Left;  Laparoscopic left inguinal hernia repair and mesh  . left tendon repair     lft foot  . ROTATOR CUFF REPAIR     lf  . TEE WITHOUT CARDIOVERSION N/A 08/14/2017   Procedure:  TRANSESOPHAGEAL ECHOCARDIOGRAM (TEE);  Surgeon: Josue Hector, MD;  Location: [facility] ENDOSCOPY;  Service: Cardiovascular;  Laterality: N/A;  . TONSILLECTOMY    . TOTAL KNEE ARTHROPLASTY Bilateral 02/06/2014   Procedure: TOTAL KNEE BILATERAL;  Surgeon: Mauri Pole, MD;  Location: [facility];  Service: Orthopedics;  Laterality: Bilateral;  . WRIST GANGLION EXCISION     lft    Current Outpatient Medications  Medication Sig Dispense Refill  . amiodarone (PACERONE) 100 MG tablet Take 1 tablet (100 mg total) by mouth daily.    . Carboxymethylcell-Hypromellose (GENTEAL) 0.25-0.3 % GEL Place 1 drop into both eyes daily as needed (dry eyes).     . carvedilol (COREG) 12.5 MG tablet Take 2 tablets (25 mg total) by mouth 2 (two) times daily with a meal. 28 tablet 0  . finasteride (PROPECIA) 1 MG tablet TAKE 1 TABLET(1 MG) BY MOUTH DAILY (Patient taking differently: Take 1 mg by mouth daily. ) 90 tablet 1  . losartan (COZAAR) 25 MG tablet Take 0.5 tablets (12.5 mg total) by mouth daily. 15 tablet 3  . Multiple Vitamins-Minerals (OCUVITE PRESERVISION PO) Take 1 tablet by mouth 2 (two) times daily.    . pantoprazole (PROTONIX) 40 MG tablet Take 1 tablet (40 mg total) by mouth 2 (two) times daily. 60 tablet 2  . temazepam (RESTORIL) 15 MG capsule TAKE 1 CAPSULE BY MOUTH EVERY DAY AT BEDTIME AS NEEDED 30 capsule 5  . torsemide (DEMADEX) 20 MG tablet Take 1-2 tablets (20-40 mg total) by mouth See admin instructions. Take 2 tablets (40 mg) by mouth every morning and 1 tablet (20 mg) at night 90 tablet 3  . traMADol (ULTRAM) 50 MG tablet Take 1 tablet (50 mg total) by mouth every 6 (six) hours as needed for moderate pain or severe pain (arthritis -hands and neck. 1 month RX). (Patient taking differently: Take 50 mg by mouth 3 (three) times daily. ) 60 tablet 5  . XARELTO 15 MG TABS tablet TAKE 1 TABLET(15 MG) BY MOUTH DAILY WITH SUPPER 60 tablet 6   No current facility-administered medications for this encounter.      No Known Allergies  Social History   Socioeconomic History  . Marital status: Single    Spouse name: Not on file  . Number of children: 0  . Years of education: Not on file  . Highest education level: Not on file  Occupational History  . Occupation: Retired  Scientific laboratory technician  . Financial resource strain: Not on file  . Food insecurity:    Worry: Not on file    Inability: Not on file  . Transportation needs:    Medical: Not on file    Non-medical: Not on file  Tobacco Use  . Smoking status: Former Smoker    Packs/day: 1.50    Years: 25.00    Pack years: 37.50    Last attempt to quit: 05/26/1986    Years since quitting: 32.1  . Smokeless tobacco: Never Used  Substance and Sexual Activity  . Alcohol use: Not Currently  Alcohol/week: 1.0 standard drinks    Types: 1 Standard drinks or equivalent per week    Comment: stopped drinking   . Drug use: No  . Sexual activity: Not Currently  Lifestyle  . Physical activity:    Days per week: Not on file    Minutes per session: Not on file  . Stress: Not on file  Relationships  . Social connections:    Talks on phone: Not on file    Gets together: Not on file    Attends religious service: Not on file    Active member of club or organization: Not on file    Attends meetings of clubs or organizations: Not on file    Relationship status: Not on file  . Intimate partner violence:    Fear of current or ex partner: Not on file    Emotionally abused: Not on file    Physically abused: Not on file    Forced sexual activity: Not on file  Other Topics Concern  . Not on file  Social History Narrative   Homosexual. Lives alone. Not sexually active.       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 Stroke Father 59       smoker  . Lung cancer Mother 106       former smoker  . Stroke  Mother   . Stroke Brother   . Colon cancer Neg Hx     ROS- All systems are reviewed and negative except as per the HPI above  Physical Exam: Vitals:   06/28/18 1050  BP: 110/76  Pulse: 97  Weight: 76.2 kg  Height: 5\' 8"  (1.727 m)   Wt Readings from Last 3 Encounters:  06/28/18 76.2 kg  06/25/18 76.2 kg  06/24/18 74.8 kg    Labs: Lab Results  Component Value Date   NA 133 (L) 06/22/2018   K 4.0 06/22/2018   CL 92 (L) 06/22/2018   CO2 28 06/22/2018   GLUCOSE 124 (H) 06/22/2018   BUN 22 06/22/2018   CREATININE 1.15 06/22/2018   CALCIUM 9.0 06/22/2018   MG 2.3 10/23/2017   Lab Results  Component Value Date   INR 1.05 06/24/2018   Lab Results  Component Value Date   CHOL 170 02/04/2018   HDL 56 02/04/2018   LDLCALC 104 (H) 02/04/2018   TRIG 50 02/04/2018     GEN- The patient is well appearing, alert and oriented x 3 today.   Head- normocephalic, atraumatic Eyes-  Sclera clear, conjunctiva pink Ears- hearing intact Oropharynx- clear Neck- supple, no JVP Lymph- no cervical lymphadenopathy Lungs- Clear to ausculation bilaterally, normal work of breathing Heart- irregular rate and rhythm, no murmurs, rubs or gallops, PMI not laterally displaced GI- soft, NT, ND, + BS Extremities- no clubbing, cyanosis, or edema MS- no significant deformity or atrophy Skin- no rash or lesion Psych- euthymic mood, full affect Neuro- strength and sensation are intact  EKG-aflutter with variable rate at  97 bpm, LBBB    Assessment and Plan: 1. Persistent  afib  Will increase coreg to 25 mg bid for better rate control so he can proceed to surgery  He had restarted xarelto last Thursday after being off x 2 days last week, be have to be haeld again x 48 hours prior to surgery  Per Dr.  Allred, if he is rate controlled, he can proceed to surgery We discussed pursuing TEE DCCV but he would not be able to stop anticoagulation x 4 weeks, he would prefer to do this after  surgery.  He has f/u with [name] on Wednesday, hopefully he will be rate controlled and can proceed to surgery at that point  Shubuta. Marrissa Dai, [facility] [address] [place], [place] [address] [phone]

## 2018-06-28 NOTE — Patient Instructions (Signed)
Increase coreg to 25 mg twice a day 

## 2018-06-30 ENCOUNTER — Encounter (HOSPITAL_COMMUNITY): Payer: Self-pay

## 2018-06-30 ENCOUNTER — Other Ambulatory Visit (HOSPITAL_COMMUNITY): Payer: Self-pay

## 2018-06-30 ENCOUNTER — Other Ambulatory Visit (HOSPITAL_COMMUNITY): Payer: Self-pay | Admitting: Cardiology

## 2018-06-30 ENCOUNTER — Ambulatory Visit (HOSPITAL_COMMUNITY)
Admission: RE | Admit: 2018-06-30 | Discharge: 2018-06-30 | Disposition: A | Discharge status: Home / Self Care | Payer: Medicare Other | Source: Ambulatory Visit | Attending: Cardiology | Admitting: Cardiology

## 2018-06-30 VITALS — BP 106/72 | HR 94 | Wt 169.8 lb

## 2018-06-30 DIAGNOSIS — E785 Hyperlipidemia, unspecified: Secondary | ICD-10-CM | POA: Diagnosis not present

## 2018-06-30 DIAGNOSIS — I4819 Other persistent atrial fibrillation: Secondary | ICD-10-CM | POA: Insufficient documentation

## 2018-06-30 DIAGNOSIS — N183 Chronic kidney disease, stage 3 (moderate): Secondary | ICD-10-CM | POA: Diagnosis not present

## 2018-06-30 DIAGNOSIS — I447 Left bundle-branch block, unspecified: Secondary | ICD-10-CM | POA: Diagnosis not present

## 2018-06-30 DIAGNOSIS — I130 Hypertensive heart and chronic kidney disease with heart failure and stage 1 through stage 4 chronic kidney disease, or unspecified chronic kidney disease: Secondary | ICD-10-CM | POA: Insufficient documentation

## 2018-06-30 DIAGNOSIS — I2510 Atherosclerotic heart disease of native coronary artery without angina pectoris: Secondary | ICD-10-CM | POA: Diagnosis not present

## 2018-06-30 DIAGNOSIS — I480 Paroxysmal atrial fibrillation: Secondary | ICD-10-CM | POA: Diagnosis not present

## 2018-06-30 DIAGNOSIS — Z7901 Long term (current) use of anticoagulants: Secondary | ICD-10-CM | POA: Insufficient documentation

## 2018-06-30 DIAGNOSIS — Q231 Congenital insufficiency of aortic valve: Secondary | ICD-10-CM

## 2018-06-30 DIAGNOSIS — Z8711 Personal history of peptic ulcer disease: Secondary | ICD-10-CM | POA: Diagnosis not present

## 2018-06-30 DIAGNOSIS — I484 Atypical atrial flutter: Secondary | ICD-10-CM

## 2018-06-30 DIAGNOSIS — I340 Nonrheumatic mitral (valve) insufficiency: Secondary | ICD-10-CM | POA: Diagnosis not present

## 2018-06-30 DIAGNOSIS — Z79899 Other long term (current) drug therapy: Secondary | ICD-10-CM | POA: Diagnosis not present

## 2018-06-30 DIAGNOSIS — I5022 Chronic systolic (congestive) heart failure: Secondary | ICD-10-CM | POA: Diagnosis not present

## 2018-06-30 DIAGNOSIS — I350 Nonrheumatic aortic (valve) stenosis: Secondary | ICD-10-CM | POA: Insufficient documentation

## 2018-06-30 DIAGNOSIS — Z87891 Personal history of nicotine dependence: Secondary | ICD-10-CM | POA: Insufficient documentation

## 2018-06-30 DIAGNOSIS — K469 Unspecified abdominal hernia without obstruction or gangrene: Secondary | ICD-10-CM | POA: Insufficient documentation

## 2018-06-30 MED ORDER — AMIODARONE HCL 100 MG PO TABS: 200.0000 mg | ORAL_TABLET | Freq: Two times a day (BID) | ORAL | 0 refills | Status: DC

## 2018-06-30 NOTE — H&P (View-Only) (Signed)
Advanced Heart Failure Clinic Note   PCP: [name], MD PCP-Cardiologist: [name], MD  HF: [name]   HPI:  Travis [name] is a 78 y.o. male with h/o persistent atrial fibrillation s/p multiple failed DCCV, failed Tikosyn, ERAF after atrial fibrillation ablation, bicuspid AV with moderate AS and Mild AI, HTN, HLD, and chronic systolic CHF (EF 15-17%) with presumed tachy-mediated CMP.   Pt failed DCCV 08/14/17, 08/31/17, and 09/17/17. Failed Tikosyn. Did have conversion with Amiodarone and DCCV. Pt underwent successful ablation 10/07/17.  However, he had ERAF.  Admitted 5/18 - 10/14/17 with atrial fibrillation/RVR and acute on chronic systolic CHF.  Echo showed EF 30-35%. Diuresed and reloaded with Amio. Underwent successful DCCV 10/13/17.    Echo in 9/19 showed improvement in EF to 50% in NSR.   In 9/19, patient reported profound fatigue.  He was volume overloaded on exam and diuretic was increased.  CBC was done, hgb was low and patient ended up going to the ER for admission.  He was diagnosed with UGIB likely from duodenal ulcer, probably due to meloxicam.  This was stopped.   Again in 10/19, he was profoundly fatigued.  CBC showed hgb 6.7, he was sent to the ER where he had 2 unit transfusion and EGD was done again, showing duodenal stenosis.  I also stopped his atorvastatin.   Recently, he was noted to go back into atrial fibrillation with mild RVR.  He was supposed to have hernia surgery, but this was put on hold due to afib/RVR.  Coreg has been titrated up, and HR is in the 90s today. He denies exertional dyspnea or chest pain.  No lightheadedness, no palpitations.  Weight is stable. He was on Crestor 5 mg daily, but stopped this as he feels like he went into atrial fibrillation when he started Crestor. No BRBPR/melena.   Labs (3/19): LDL 65 Labs (8/19): K 5.2 => 4.1, creatinine 1.6 Labs (9/19): K 3.9, creatinine 1.78, hgb 9.6, TSH normal.  Labs (10/19): hgb 6.7 => 11.8, LFTs  normal, K 3.6, creatinine 1.3 Labs (11/19): LFTs, TSH normal Labs (1/20): K 4, creatinine 1.15, hgb 14.5  ECG (personally reviewed): NSR, LBBB QRS 148 msec  Review of systems complete and found to be negative unless listed in HPI.    Past Medical History 1. Chronic systolic CHF: Possible tachy-mediated cardiomyopathy.    - Echo (08/13/2017): LVEF 30-35%, moderate AS, mild AI, severe LAE, RV mild dilated with systolic function mild/mod reduced, PA peak pressure 46 mm Hg. - Echo (9/19): EF 50%, mild diffuse hypokinesis, normal RV size and systolic function, PASP 70 mmHg, moderate AS, moderate AI, moderate MR.  2. Atrial fibrillation: Has been difficult to control.  Failed Tikosyn and multiple DCCVs.  Failed amiodarone prior to ablation.  Atrial fibrillation ablation but then had ERAF.  He was cardioverted again in 5/19 with return to NSR.    3. Bicuspid aortic valve disorder: Moderate AS and moderate AI on last echo.   4. HTN 5. Hyperlipidemia: Severe fatigue with atorvastatin.  6. Mitral regurgitation: Moderate on last echo.  7. CAD: coronary calcium noted on chest CT.  8. CKD: Stage 3.  9. Carotid stenosis: Carotid dopplers (6/16) with 07-37% LICA stenosis.  10. Chronic LBBB 11. PUD: UGIB in 9/19 with duodenal ulcer probably due to meloxicam use.  - EGD (10/19): Duodenal stenosis.   Current Outpatient Medications  Medication Sig Dispense Refill  . Carboxymethylcell-Hypromellose (GENTEAL) 0.25-0.3 % GEL Place 1 drop into both  eyes daily as needed (dry eyes).     . carvedilol (COREG) 12.5 MG tablet Take 2 tablets (25 mg total) by mouth 2 (two) times daily with a meal. 28 tablet 0  . finasteride (PROPECIA) 1 MG tablet TAKE 1 TABLET(1 MG) BY MOUTH DAILY (Patient taking differently: Take 1 mg by mouth daily. ) 90 tablet 1  . losartan (COZAAR) 25 MG tablet Take 0.5 tablets (12.5 mg total) by mouth daily. 15 tablet 3  . Multiple Vitamins-Minerals (OCUVITE PRESERVISION PO) Take 1 tablet by mouth  2 (two) times daily.    . pantoprazole (PROTONIX) 40 MG tablet Take 1 tablet (40 mg total) by mouth 2 (two) times daily. 60 tablet 2  . temazepam (RESTORIL) 15 MG capsule TAKE 1 CAPSULE BY MOUTH EVERY DAY AT BEDTIME AS NEEDED 30 capsule 5  . torsemide (DEMADEX) 20 MG tablet Take 1-2 tablets (20-40 mg total) by mouth See admin instructions. Take 2 tablets (40 mg) by mouth every morning and 1 tablet (20 mg) at night 90 tablet 3  . traMADol (ULTRAM) 50 MG tablet Take 1 tablet (50 mg total) by mouth every 6 (six) hours as needed for moderate pain or severe pain (arthritis -hands and neck. 1 month RX). (Patient taking differently: Take 50 mg by mouth 3 (three) times daily. ) 60 tablet 5  . XARELTO 15 MG TABS tablet TAKE 1 TABLET(15 MG) BY MOUTH DAILY WITH SUPPER 60 tablet 6  . amiodarone (PACERONE) 100 MG tablet TAKE 2 TABLETS BY MOUTH TWICE DAILY 60 tablet 3   No current facility-administered medications for this encounter.    No Known Allergies  Social History   Socioeconomic History  . Marital status: Single    Spouse name: Not on file  . Number of children: 0  . Years of education: Not on file  . Highest education level: Not on file  Occupational History  . Occupation: Retired  Scientific laboratory technician  . Financial resource strain: Not on file  . Food insecurity:    Worry: Not on file    Inability: Not on file  . Transportation needs:    Medical: Not on file    Non-medical: Not on file  Tobacco Use  . Smoking status: Former Smoker    Packs/day: 1.50    Years: 25.00    Pack years: 37.50    Last attempt to quit: 05/26/1986    Years since quitting: 32.1  . Smokeless tobacco: Never Used  Substance and Sexual Activity  . Alcohol use: Not Currently    Alcohol/week: 1.0 standard drinks    Types: 1 Standard drinks or equivalent per week    Comment: stopped drinking   . Drug use: No  . Sexual activity: Not Currently  Lifestyle  . Physical activity:    Days per week: Not on file    Minutes per  session: Not on file  . Stress: Not on file  Relationships  . Social connections:    Talks on phone: Not on file    Gets together: Not on file    Attends religious service: Not on file    Active member of club or organization: Not on file    Attends meetings of clubs or organizations: Not on file    Relationship status: Not on file  . Intimate partner violence:    Fear of current or ex partner: Not on file    Emotionally abused: Not on file    Physically abused: Not on file  Forced sexual activity: Not on file  Other Topics Concern  . Not on file  Social History Narrative   Homosexual. Lives alone. Not sexually active.       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 Stroke Father 17       smoker  . Lung cancer Mother 68       former smoker  . Stroke Mother   . Stroke Brother   . Colon cancer Neg Hx    Vitals:   06/30/18 1039  BP: 106/72  Pulse: 94  SpO2: 94%  Weight: 77 kg (169 lb 12.8 oz)     Wt Readings from Last 3 Encounters:  06/30/18 77 kg (169 lb 12.8 oz)  06/28/18 76.2 kg (168 lb)  06/25/18 76.2 kg (168 lb)    PHYSICAL EXAM: General: NAD Neck: No JVD, no thyromegaly or thyroid nodule.  Lungs: Clear to auscultation bilaterally with normal respiratory effort. CV: Nondisplaced PMI.  Heart irregular S1/S2, no S3/S4, no murmur.  No peripheral edema.  No carotid bruit.  Normal pedal pulses.  Abdomen: Soft, nontender, no hepatosplenomegaly, no distention.  Skin: Intact without lesions or rashes.  Neurologic: Alert and oriented x 3.  Psych: Normal affect. Extremities: No clubbing or cyanosis.  HEENT: Normal.   ASSESSMENT & PLAN: 1. Chronic systolic CHF: Echo 9/14 with EF 30-35%, mild to moderately decreased RV systolic function in setting of atrial fibrillation with RVR.  Echo in 9/19 with patient in NSR showed EF up  to 50%, mild diffuse hypokinesis.  I suspect he had a tachy-mediated cardiomyopathy.  He is back in atrial fibrillation, which concerns me as EF may drop again.  He does not look volume overloaded on exam, NYHA class II symptoms.    - Continue torsemide 40 qam/20 qpm. - Continue losartan 12.5 mg daily.  - Continue Coreg 25 mg bid.  He has tolerated the increase.   - Off spironolactone with elevated K.  Can leave off for now with EF back up to 50% on last echo (unless EF falls with recurrent atrial fibrillation).  - Need to keep him in NSR (see below).  2. Atrial fibrillation: Paroxysmal.  Difficult to control rate and rhythm. He failed Tikosyn. He started on amiodarone, then had atrial fibrillation ablation in 5/19 with ERAF, requiring DCCV.  He is back in atrial fibrillation today.  - Increase amiodarone to 200 mg bid until DCCV (see below).  Check LFTs/TSH with next lab draw, he will need regular eye exam.  - Continue Xarelto.  - We discussed options today. Given suspected tachy-mediated CMP in the past, I think we need to get him back into NSR.  He missed a few Xarelto doses when he initially held Xarelto for surgery.  His hernia is not markedly symptomatic, and he thinks that he could wait a month prior to an operation.  Therefore, I will bring him in for TEE-guided DCCV next week.  He will not be able to stop therapeutic anticoagulatiion for 1 month post-DCCV.  We discussed risks/benefits and he agrees to proceed.  He knows that this will delay surgery for about a month.  3. CAD: Extensive coronary calcification on recent CT chest. No chest pain. I suspect that his cardiomyopathy was tachycardia-mediated given rise in EF in NSR rather than ischemic cardiomyopathy.  - No ASA  with Xarelto use.  - Unable to tolerate statins.  Will discuss Repatha with lipid clinic.    4. CKD: Stage 3.  5. Bicuspid aortic valve stenosis: Moderate AS/moderate AI on last echo.  6. Mitral regurgitation: Moderate on  echo in 9/19.   7. LBBB: Chronic.  8. Hyperlipidemia: Unable to tolerate statins.  Recently stopped low dose Crestor as atrial fibrillation started right after he started the statin.  I will see if we can get Repatha for him.  9. Hernia: Needs surgery.  Will have to wait 1 month post-DCCV.  He is not severely symptomatic.   Followup in atrial fibrillation clinic in 2 wks then see me in 2 months.    [name], MD 06/30/18

## 2018-06-30 NOTE — Patient Instructions (Signed)
INCREASE Amiodarone to 200mg  twice daily until TEE/Cardioversion.  You have been scheduled for a TEE/Cardioversion(please see attached instruction sheet)  Follow up with afib clinic in 2 weeks.  Follow up with Dr.McLean in 2 months.

## 2018-06-30 NOTE — Progress Notes (Signed)
Advanced Heart Failure Clinic Note   PCP: [name], MD PCP-Cardiologist: [name], MD  HF: [name]   HPI:  Travis [name] is a 78 y.o. 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   Pt failed DCCV 08/14/17, 08/31/17, and 09/17/17. Failed Tikosyn. Did have conversion with Amiodarone and DCCV. Pt underwent successful ablation 10/07/17.  However, he had ERAF.  Admitted 5/18 - 10/14/17 with atrial fibrillation/RVR and acute on chronic systolic CHF.  Echo showed EF 30-35%. Diuresed and reloaded with Amio. Underwent successful DCCV 10/13/17.    Echo in 9/19 showed improvement in EF to 50% in NSR.   In 9/19, patient reported profound fatigue.  He was volume overloaded on exam and diuretic was increased.  CBC was done, hgb was low and patient ended up going to the ER for admission.  He was diagnosed with UGIB likely from duodenal ulcer, probably due to meloxicam.  This was stopped.   Again in 10/19, he was profoundly fatigued.  CBC showed hgb 6.7, he was sent to the ER where he had 2 unit transfusion and EGD was done again, showing duodenal stenosis.  I also stopped his atorvastatin.   Recently, he was noted to go back into atrial fibrillation with mild RVR.  He was supposed to have hernia surgery, but this was put on hold due to afib/RVR.  Coreg has been titrated up, and HR is in the 90s today. He denies exertional dyspnea or chest pain.  No lightheadedness, no palpitations.  Weight is stable. He was on Crestor 5 mg daily, but stopped this as he feels like he went into atrial fibrillation when he started Crestor. No BRBPR/melena.   Labs (3/19): LDL 65 Labs (8/19): K 5.2 => 4.1, creatinine 1.6 Labs (9/19): K 3.9, creatinine 1.78, hgb 9.6, TSH normal.  Labs (10/19): hgb 6.7 => 11.8, LFTs  normal, K 3.6, creatinine 1.3 Labs (11/19): LFTs, TSH normal Labs (1/20): K 4, creatinine 1.15, hgb 14.5  ECG (personally reviewed): NSR, LBBB QRS 148 msec  Review of systems complete and found to be negative unless listed in HPI.    Past Medical History 1. Chronic systolic CHF: Possible tachy-mediated cardiomyopathy.    - Echo (08/13/2017): LVEF 30-35%, moderate AS, mild AI, severe LAE, RV mild dilated with systolic function mild/mod reduced, PA peak pressure 46 mm Hg. - Echo (9/19): EF 50%, mild diffuse hypokinesis, normal RV size and systolic function, PASP 70 mmHg, moderate AS, moderate AI, moderate MR.  2. Atrial fibrillation: Has been difficult to control.  Failed Tikosyn and multiple DCCVs.  Failed amiodarone prior to ablation.  Atrial fibrillation ablation but then had ERAF.  He was cardioverted again in 5/19 with return to NSR.    3. Bicuspid aortic valve disorder: Moderate AS and moderate AI on last echo.   4. HTN 5. Hyperlipidemia: Severe fatigue with atorvastatin.  6. Mitral regurgitation: Moderate on last echo.  7. CAD: coronary calcium noted on chest CT.  8. CKD: Stage 3.  9. Carotid stenosis: Carotid dopplers (4/40) with 34-74% LICA stenosis.  10. Chronic LBBB 11. PUD: UGIB in 9/19 with duodenal ulcer probably due to meloxicam use.  - EGD (10/19): Duodenal stenosis.   Current Outpatient Medications  Medication Sig Dispense Refill  . Carboxymethylcell-Hypromellose (GENTEAL) 0.25-0.3 % GEL Place 1 drop into both  eyes daily as needed (dry eyes).     . carvedilol (COREG) 12.5 MG tablet Take 2 tablets (25 mg total) by mouth 2 (two) times daily with a meal. 28 tablet 0  . finasteride (PROPECIA) 1 MG tablet TAKE 1 TABLET(1 MG) BY MOUTH DAILY (Patient taking differently: Take 1 mg by mouth daily. ) 90 tablet 1  . losartan (COZAAR) 25 MG tablet Take 0.5 tablets (12.5 mg total) by mouth daily. 15 tablet 3  . Multiple Vitamins-Minerals (OCUVITE PRESERVISION PO) Take 1 tablet by mouth  2 (two) times daily.    . pantoprazole (PROTONIX) 40 MG tablet Take 1 tablet (40 mg total) by mouth 2 (two) times daily. 60 tablet 2  . temazepam (RESTORIL) 15 MG capsule TAKE 1 CAPSULE BY MOUTH EVERY DAY AT BEDTIME AS NEEDED 30 capsule 5  . torsemide (DEMADEX) 20 MG tablet Take 1-2 tablets (20-40 mg total) by mouth See admin instructions. Take 2 tablets (40 mg) by mouth every morning and 1 tablet (20 mg) at night 90 tablet 3  . traMADol (ULTRAM) 50 MG tablet Take 1 tablet (50 mg total) by mouth every 6 (six) hours as needed for moderate pain or severe pain (arthritis -hands and neck. 1 month RX). (Patient taking differently: Take 50 mg by mouth 3 (three) times daily. ) 60 tablet 5  . XARELTO 15 MG TABS tablet TAKE 1 TABLET(15 MG) BY MOUTH DAILY WITH SUPPER 60 tablet 6  . amiodarone (PACERONE) 100 MG tablet TAKE 2 TABLETS BY MOUTH TWICE DAILY 60 tablet 3   No current facility-administered medications for this encounter.    No Known Allergies  Social History   Socioeconomic History  . Marital status: Single    Spouse name: Not on file  . Number of children: 0  . Years of education: Not on file  . Highest education level: Not on file  Occupational History  . Occupation: Retired  Scientific laboratory technician  . Financial resource strain: Not on file  . Food insecurity:    Worry: Not on file    Inability: Not on file  . Transportation needs:    Medical: Not on file    Non-medical: Not on file  Tobacco Use  . Smoking status: Former Smoker    Packs/day: 1.50    Years: 25.00    Pack years: 37.50    Last attempt to quit: 05/26/1986    Years since quitting: 32.1  . Smokeless tobacco: Never Used  Substance and Sexual Activity  . Alcohol use: Not Currently    Alcohol/week: 1.0 standard drinks    Types: 1 Standard drinks or equivalent per week    Comment: stopped drinking   . Drug use: No  . Sexual activity: Not Currently  Lifestyle  . Physical activity:    Days per week: Not on file    Minutes per  session: Not on file  . Stress: Not on file  Relationships  . Social connections:    Talks on phone: Not on file    Gets together: Not on file    Attends religious service: Not on file    Active member of club or organization: Not on file    Attends meetings of clubs or organizations: Not on file    Relationship status: Not on file  . Intimate partner violence:    Fear of current or ex partner: Not on file    Emotionally abused: Not on file    Physically abused: Not on file  Forced sexual activity: Not on file  Other Topics Concern  . Not on file  Social History Narrative   Homosexual. Lives alone. Not sexually active.       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 Stroke Father 65       smoker  . Lung cancer Mother 61       former smoker  . Stroke Mother   . Stroke Brother   . Colon cancer Neg Hx    Vitals:   06/30/18 1039  BP: 106/72  Pulse: 94  SpO2: 94%  Weight: 77 kg (169 lb 12.8 oz)     Wt Readings from Last 3 Encounters:  06/30/18 77 kg (169 lb 12.8 oz)  06/28/18 76.2 kg (168 lb)  06/25/18 76.2 kg (168 lb)    PHYSICAL EXAM: General: NAD Neck: No JVD, no thyromegaly or thyroid nodule.  Lungs: Clear to auscultation bilaterally with normal respiratory effort. CV: Nondisplaced PMI.  Heart irregular S1/S2, no S3/S4, no murmur.  No peripheral edema.  No carotid bruit.  Normal pedal pulses.  Abdomen: Soft, nontender, no hepatosplenomegaly, no distention.  Skin: Intact without lesions or rashes.  Neurologic: Alert and oriented x 3.  Psych: Normal affect. Extremities: No clubbing or cyanosis.  HEENT: Normal.   ASSESSMENT & PLAN: 1. Chronic systolic CHF: Echo 7/82 with EF 30-35%, mild to moderately decreased RV systolic function in setting of atrial fibrillation with RVR.  Echo in 9/19 with patient in NSR showed EF up  to 50%, mild diffuse hypokinesis.  I suspect he had a tachy-mediated cardiomyopathy.  He is back in atrial fibrillation, which concerns me as EF may drop again.  He does not look volume overloaded on exam, NYHA class II symptoms.    - Continue torsemide 40 qam/20 qpm. - Continue losartan 12.5 mg daily.  - Continue Coreg 25 mg bid.  He has tolerated the increase.   - Off spironolactone with elevated K.  Can leave off for now with EF back up to 50% on last echo (unless EF falls with recurrent atrial fibrillation).  - Need to keep him in NSR (see below).  2. Atrial fibrillation: Paroxysmal.  Difficult to control rate and rhythm. He failed Tikosyn. He started on amiodarone, then had atrial fibrillation ablation in 5/19 with ERAF, requiring DCCV.  He is back in atrial fibrillation today.  - Increase amiodarone to 200 mg bid until DCCV (see below).  Check LFTs/TSH with next lab draw, he will need regular eye exam.  - Continue Xarelto.  - We discussed options today. Given suspected tachy-mediated CMP in the past, I think we need to get him back into NSR.  He missed a few Xarelto doses when he initially held Xarelto for surgery.  His hernia is not markedly symptomatic, and he thinks that he could wait a month prior to an operation.  Therefore, I will bring him in for TEE-guided DCCV next week.  He will not be able to stop therapeutic anticoagulatiion for 1 month post-DCCV.  We discussed risks/benefits and he agrees to proceed.  He knows that this will delay surgery for about a month.  3. CAD: Extensive coronary calcification on recent CT chest. No chest pain. I suspect that his cardiomyopathy was tachycardia-mediated given rise in EF in NSR rather than ischemic cardiomyopathy.  - No ASA  with Xarelto use.  - Unable to tolerate statins.  Will discuss Repatha with lipid clinic.    4. CKD: Stage 3.  5. Bicuspid aortic valve stenosis: Moderate AS/moderate AI on last echo.  6. Mitral regurgitation: Moderate on  echo in 9/19.   7. LBBB: Chronic.  8. Hyperlipidemia: Unable to tolerate statins.  Recently stopped low dose Crestor as atrial fibrillation started right after he started the statin.  I will see if we can get Repatha for him.  9. Hernia: Needs surgery.  Will have to wait 1 month post-DCCV.  He is not severely symptomatic.   Followup in atrial fibrillation clinic in 2 wks then see me in 2 months.    [name], MD 06/30/18

## 2018-06-30 NOTE — Progress Notes (Signed)
Sign and held orders placed for TEE/Cardioversion.

## 2018-06-30 NOTE — Addendum Note (Signed)
Encounter addended by: Larey Dresser, MD on: 06/30/2018 10:40 PM  Actions taken: LOS modified

## 2018-07-01 ENCOUNTER — Telehealth: Payer: Self-pay | Admitting: Pharmacist

## 2018-07-01 MED ORDER — EVOLOCUMAB 140 MG/ML ~~LOC~~ SOAJ: 1.0000 "pen " | SUBCUTANEOUS | 11 refills | Status: DC

## 2018-07-01 NOTE — Telephone Encounter (Signed)
Received message from Dr Aundra Dubin regarding [phone] therapy for pt as he did not tolerate rosuvastatin 5mg  daily or atorvastatin 10mg  and 40mg  daily. Prior authorization submitted and approved. Rx sent to pharmacy to determine if copay is affordable.

## 2018-07-01 NOTE — Telephone Encounter (Signed)
Repatha copay affordable at $30 per month. Patient is aware and will call clinic once he has received medication so that we can help with his first injection and schedule follow up labs.

## 2018-07-02 ENCOUNTER — Encounter (HOSPITAL_COMMUNITY): Payer: Medicare Other | Admitting: Cardiology

## 2018-07-05 ENCOUNTER — Encounter (HOSPITAL_COMMUNITY)
Admission: RE | Disposition: A | Discharge status: Home / Self Care | Payer: Self-pay | Source: Home / Self Care | Attending: Cardiology

## 2018-07-05 ENCOUNTER — Encounter (HOSPITAL_COMMUNITY): Payer: Self-pay | Admitting: *Deleted

## 2018-07-05 ENCOUNTER — Other Ambulatory Visit (HOSPITAL_COMMUNITY): Payer: Self-pay | Admitting: Cardiology

## 2018-07-05 ENCOUNTER — Ambulatory Visit (HOSPITAL_COMMUNITY): Payer: Medicare Other | Admitting: Certified Registered Nurse Anesthetist

## 2018-07-05 ENCOUNTER — Ambulatory Visit (HOSPITAL_COMMUNITY)
Admission: RE | Admit: 2018-07-05 | Discharge: 2018-07-05 | Disposition: A | Discharge status: Home / Self Care | Payer: Medicare Other | Attending: Cardiology | Admitting: Cardiology

## 2018-07-05 ENCOUNTER — Ambulatory Visit (HOSPITAL_COMMUNITY): Payer: Medicare Other

## 2018-07-05 ENCOUNTER — Other Ambulatory Visit: Payer: Self-pay

## 2018-07-05 DIAGNOSIS — I2510 Atherosclerotic heart disease of native coronary artery without angina pectoris: Secondary | ICD-10-CM | POA: Insufficient documentation

## 2018-07-05 DIAGNOSIS — I080 Rheumatic disorders of both mitral and aortic valves: Secondary | ICD-10-CM | POA: Insufficient documentation

## 2018-07-05 DIAGNOSIS — I083 Combined rheumatic disorders of mitral, aortic and tricuspid valves: Secondary | ICD-10-CM | POA: Diagnosis not present

## 2018-07-05 DIAGNOSIS — Z7901 Long term (current) use of anticoagulants: Secondary | ICD-10-CM | POA: Diagnosis not present

## 2018-07-05 DIAGNOSIS — Z79899 Other long term (current) drug therapy: Secondary | ICD-10-CM | POA: Insufficient documentation

## 2018-07-05 DIAGNOSIS — Z8711 Personal history of peptic ulcer disease: Secondary | ICD-10-CM | POA: Insufficient documentation

## 2018-07-05 DIAGNOSIS — I130 Hypertensive heart and chronic kidney disease with heart failure and stage 1 through stage 4 chronic kidney disease, or unspecified chronic kidney disease: Secondary | ICD-10-CM | POA: Insufficient documentation

## 2018-07-05 DIAGNOSIS — I447 Left bundle-branch block, unspecified: Secondary | ICD-10-CM | POA: Insufficient documentation

## 2018-07-05 DIAGNOSIS — Z87891 Personal history of nicotine dependence: Secondary | ICD-10-CM | POA: Insufficient documentation

## 2018-07-05 DIAGNOSIS — I4819 Other persistent atrial fibrillation: Secondary | ICD-10-CM

## 2018-07-05 DIAGNOSIS — I6523 Occlusion and stenosis of bilateral carotid arteries: Secondary | ICD-10-CM | POA: Insufficient documentation

## 2018-07-05 DIAGNOSIS — I5022 Chronic systolic (congestive) heart failure: Secondary | ICD-10-CM | POA: Diagnosis not present

## 2018-07-05 DIAGNOSIS — I420 Dilated cardiomyopathy: Secondary | ICD-10-CM | POA: Insufficient documentation

## 2018-07-05 DIAGNOSIS — I5023 Acute on chronic systolic (congestive) heart failure: Secondary | ICD-10-CM | POA: Diagnosis not present

## 2018-07-05 DIAGNOSIS — I480 Paroxysmal atrial fibrillation: Secondary | ICD-10-CM | POA: Diagnosis not present

## 2018-07-05 DIAGNOSIS — E785 Hyperlipidemia, unspecified: Secondary | ICD-10-CM | POA: Diagnosis not present

## 2018-07-05 DIAGNOSIS — I110 Hypertensive heart disease with heart failure: Secondary | ICD-10-CM | POA: Diagnosis not present

## 2018-07-05 DIAGNOSIS — Z823 Family history of stroke: Secondary | ICD-10-CM | POA: Insufficient documentation

## 2018-07-05 DIAGNOSIS — N183 Chronic kidney disease, stage 3 (moderate): Secondary | ICD-10-CM | POA: Diagnosis not present

## 2018-07-05 DIAGNOSIS — I4891 Unspecified atrial fibrillation: Secondary | ICD-10-CM | POA: Diagnosis not present

## 2018-07-05 HISTORY — PX: TEE WITHOUT CARDIOVERSION: SHX5443

## 2018-07-05 HISTORY — PX: CARDIOVERSION: SHX1299

## 2018-07-05 LAB — POCT I-STAT 4, (NA,K, GLUC, HGB,HCT)
Glucose, Bld: 131 mg/dL — ABNORMAL HIGH (ref 70–99)
HCT: 39 % (ref 39.0–52.0)
Hemoglobin: 13.3 g/dL (ref 13.0–17.0)
Potassium: 3.8 mmol/L (ref 3.5–5.1)
Sodium: 136 mmol/L (ref 135–145)

## 2018-07-05 SURGERY — ECHOCARDIOGRAM, TRANSESOPHAGEAL
Anesthesia: General

## 2018-07-05 MED ORDER — AMIODARONE HCL 100 MG PO TABS: 200.0000 mg | ORAL_TABLET | Freq: Every day | ORAL | 3 refills | Status: DC

## 2018-07-05 MED ORDER — TORSEMIDE 20 MG PO TABS: 40.0000 mg | ORAL_TABLET | Freq: Two times a day (BID) | ORAL | 3 refills | Status: DC

## 2018-07-05 MED ORDER — CARVEDILOL 12.5 MG PO TABS: 12.5000 mg | ORAL_TABLET | Freq: Two times a day (BID) | ORAL | 0 refills | Status: DC

## 2018-07-05 MED ORDER — ONDANSETRON HCL 4 MG/2ML IJ SOLN
INTRAMUSCULAR | Status: DC | PRN
  Administered 2018-07-05: 4 mg via INTRAVENOUS

## 2018-07-05 MED ORDER — PHENYLEPHRINE 40 MCG/ML (10ML) SYRINGE FOR IV PUSH (FOR BLOOD PRESSURE SUPPORT)
PREFILLED_SYRINGE | INTRAVENOUS | Status: DC | PRN
  Administered 2018-07-05: 120 ug via INTRAVENOUS

## 2018-07-05 MED ORDER — LIDOCAINE 2% (20 MG/ML) 5 ML SYRINGE
INTRAMUSCULAR | Status: DC | PRN
  Administered 2018-07-05: 60 mg via INTRAVENOUS

## 2018-07-05 MED ORDER — BUTAMBEN-TETRACAINE-BENZOCAINE 2-2-14 % EX AERO
INHALATION_SPRAY | CUTANEOUS | Status: DC | PRN
  Administered 2018-07-05: 2 via TOPICAL

## 2018-07-05 MED ORDER — PROPOFOL 10 MG/ML IV BOLUS
INTRAVENOUS | Status: DC | PRN
  Administered 2018-07-05: 20 mg via INTRAVENOUS

## 2018-07-05 MED ORDER — EPHEDRINE SULFATE-NACL 50-0.9 MG/10ML-% IV SOSY
PREFILLED_SYRINGE | INTRAVENOUS | Status: DC | PRN
  Administered 2018-07-05 (×2): 10 mg via INTRAVENOUS

## 2018-07-05 MED ORDER — SODIUM CHLORIDE 0.9 % IV SOLN
INTRAVENOUS | Status: DC
  Administered 2018-07-05 (×2): via INTRAVENOUS

## 2018-07-05 MED ORDER — PROPOFOL 500 MG/50ML IV EMUL
INTRAVENOUS | Status: DC | PRN
  Administered 2018-07-05: 125 ug/kg/min via INTRAVENOUS

## 2018-07-05 NOTE — Discharge Instructions (Signed)
TEE ° °YOU HAD AN CARDIAC PROCEDURE TODAY: Refer to the procedure report and other information in the discharge instructions given to you for any specific questions about what was found during the examination. If this information does not answer your questions, please call [facility] office at [phone] to clarify.  ° °DIET: Your first meal following the procedure should be a light meal and then it is ok to progress to your normal diet. A half-sandwich or bowl of soup is an example of a good first meal. Heavy or fried foods are harder to digest and may make you feel nauseous or bloated. Drink plenty of fluids but you should avoid alcoholic beverages for 24 hours. If you had a esophageal dilation, please see attached instructions for diet.  ° °ACTIVITY: Your care partner should take you home directly after the procedure. You should plan to take it easy, moving slowly for the rest of the day. You can resume normal activity the day after the procedure however YOU SHOULD NOT DRIVE, use power tools, machinery or perform tasks that involve climbing or major physical exertion for 24 hours (because of the sedation medicines used during the test).  ° °SYMPTOMS TO REPORT IMMEDIATELY: °A cardiologist can be reached at any hour. Please call [phone] for any of the following symptoms:  °Vomiting of blood or coffee ground material  °New, significant abdominal pain  °New, significant chest pain or pain under the shoulder blades  °Painful or persistently difficult swallowing  °New shortness of breath  °Black, tarry-looking or red, bloody stools ° °FOLLOW UP:  °Please also call with any specific questions about appointments or follow up tests. ° °Electrical Cardioversion, Care After °This sheet gives you information about how to care for yourself after your procedure. Your health care provider may also give you more specific instructions. If you have problems or questions, contact your health care provider. °What can I  expect after the procedure? °After the procedure, it is common to have: °· Some redness on the skin where the shocks were given. °Follow these instructions at home: ° °· Do not drive for 24 hours if you were given a medicine to help you relax (sedative). °· Take over-the-counter and prescription medicines only as told by your health care provider. °· Ask your health care provider how to check your pulse. Check it often. °· Rest for 48 hours after the procedure or as told by your health care provider. °· Avoid or limit your caffeine use as told by your health care provider. °Contact a health care provider if: °· You feel like your heart is beating too quickly or your pulse is not regular. °· You have a serious muscle cramp that does not go away. °Get help right away if: ° °· You have discomfort in your chest. °· You are dizzy or you feel faint. °· You have trouble breathing or you are short of breath. °· Your speech is slurred. °· You have trouble moving an arm or leg on one side of your body. °· Your fingers or toes turn cold or blue. °This information is not intended to replace advice given to you by your health care provider. Make sure you discuss any questions you have with your health care provider. °Document Released: 03/02/2013 Document Revised: 12/14/2015 Document Reviewed: 11/16/2015 °Elsevier Interactive Patient Education © 2019 [employer] ° °

## 2018-07-05 NOTE — Transfer of Care (Signed)
Immediate Anesthesia Transfer of Care Note  Patient: Travis [name]  Procedure(s) Performed: TRANSESOPHAGEAL ECHOCARDIOGRAM (TEE) (N/A ) CARDIOVERSION (N/A )  Patient Location: Endoscopy Unit  Anesthesia Type:MAC  Level of Consciousness: awake, alert  and oriented  Airway & Oxygen Therapy: Patient Spontanous Breathing and Patient connected to nasal cannula oxygen  Post-op Assessment: Report given to RN and Post -op Vital signs reviewed and stable  Post vital signs: Reviewed and stable  Last Vitals:  Vitals Value Taken Time  BP 101/57 07/05/2018  1:26 PM  Temp    Pulse 51 07/05/2018  1:27 PM  Resp 21 07/05/2018  1:27 PM  SpO2 96 % 07/05/2018  1:27 PM  Vitals shown include unvalidated device data.  Last Pain:  Vitals:   07/05/18 1040  TempSrc: Oral  PainSc: 0-No pain         Complications: No apparent anesthesia complications

## 2018-07-05 NOTE — Anesthesia Preprocedure Evaluation (Signed)
Anesthesia Evaluation  Patient identified by MRN, date of birth, ID band Patient awake    Reviewed: Allergy & Precautions, NPO status , Patient's Chart, lab work & pertinent test results  Airway Mallampati: I  TM Distance: >3 FB Neck ROM: Full    Dental   Pulmonary former smoker,    Pulmonary exam normal        Cardiovascular hypertension, Pt. on medications Normal cardiovascular exam+ dysrhythmias Atrial Fibrillation      Neuro/Psych Anxiety    GI/Hepatic GERD  Medicated and Controlled,  Endo/Other    Renal/GU Renal InsufficiencyRenal disease     Musculoskeletal   Abdominal   Peds  Hematology   Anesthesia Other Findings   Reproductive/Obstetrics                             Anesthesia Physical Anesthesia Plan  ASA: III  Anesthesia Plan: General   Post-op Pain Management:    Induction: Intravenous  PONV Risk Score and Plan: 2  Airway Management Planned: Mask  Additional Equipment:   Intra-op Plan:   Post-operative Plan:   Informed Consent: I have reviewed the patients History and Physical, chart, labs and discussed the procedure including the risks, benefits and alternatives for the proposed anesthesia with the patient or authorized representative who has indicated his/her understanding and acceptance.       Plan Discussed with: CRNA and Surgeon  Anesthesia Plan Comments:         Anesthesia Quick Evaluation

## 2018-07-05 NOTE — Interval H&P Note (Signed)
History and Physical Interval Note:  07/05/2018 12:32 PM  Travis [name]  has presented today for surgery, with the diagnosis of A-FIB  The various methods of treatment have been discussed with the patient and family. After consideration of risks, benefits and other options for treatment, the patient has consented to  Procedure(s): TRANSESOPHAGEAL ECHOCARDIOGRAM (TEE) (N/A) CARDIOVERSION (N/A) as a surgical intervention .  The patient's history has been reviewed, patient examined, no change in status, stable for surgery.  I have reviewed the patient's chart and labs.  Questions were answered to the patient's satisfaction.     Dalton [employer]

## 2018-07-05 NOTE — CV Procedure (Signed)
Procedure: TEE  Sedation: Per anesthesiology  Indication: Atrial fibrillation  Findings: Please see echo section for full report.  Mild-moderately dilated LV with EF 35-40%, diffuse hypokinesis.  Normal RV size with mildly decreased systolic function.  Moderate left atrial enlargement, no LA appendage thrombus.  Mild right atrial enlargement.  Mild mitral regurgitation.  Trileaflet aortic valve with moderate-severe calcification.  There was mild aortic stenosis when mean gradient 10 mmHg, AVA 1.7 cm^2 . There was moderate aortic insufficiency.  Trivial tricuspid regurgitation.  Normal caliber thoracic aorta with grade III plaque in descending thoracic aorta.   Ok to proceed to DCCV.   [name] 07/05/2018 1:16 PM

## 2018-07-05 NOTE — Procedures (Signed)
Electrical Cardioversion Procedure Note Travis [name] [id number] 1941/01/16  Procedure: Electrical Cardioversion Indications:  Atrial Fibrillation  Procedure Details Consent: Risks of procedure as well as the alternatives and risks of each were explained to the (patient/caregiver).  Consent for procedure obtained. Time Out: Verified patient identification, verified procedure, site/side was marked, verified correct patient position, special equipment/implants available, medications/allergies/relevent history reviewed, required imaging and test results available.  Performed  Patient placed on cardiac monitor, pulse oximetry, supplemental oxygen as necessary.  Sedation given: Propofol per anesthesiology Pacer pads placed anterior and posterior chest.  Cardioverted 1 time(s).  Cardioverted at [place].  Evaluation Findings: Post procedure EKG shows: NSR Complications: None Patient did tolerate procedure well.   Travis [name] 07/05/2018, 1:17 PM

## 2018-07-06 ENCOUNTER — Encounter (HOSPITAL_COMMUNITY): Payer: Self-pay | Admitting: Cardiology

## 2018-07-06 NOTE — Anesthesia Postprocedure Evaluation (Signed)
Anesthesia Post Note  Patient: Travis [name]  Procedure(s) Performed: TRANSESOPHAGEAL ECHOCARDIOGRAM (TEE) (N/A ) CARDIOVERSION (N/A )     Patient location during evaluation: PACU Anesthesia Type: General Level of consciousness: awake and alert Pain management: pain level controlled Vital Signs Assessment: post-procedure vital signs reviewed and stable Respiratory status: spontaneous breathing, nonlabored ventilation, respiratory function stable and patient connected to nasal cannula oxygen Cardiovascular status: blood pressure returned to baseline and stable Postop Assessment: no apparent nausea or vomiting Anesthetic complications: no    Last Vitals:  Vitals:   07/05/18 1327 07/05/18 1340  BP: (!) 101/57 (!) 114/58  Pulse: (!) 51 (!) 52  Resp: (!) 21 11  Temp: 36.4 C   SpO2: 96% 95%    Last Pain:  Vitals:   07/05/18 1327  TempSrc: Oral  PainSc: 0-No pain                 Duanna Runk DAVID

## 2018-07-08 ENCOUNTER — Telehealth: Payer: Self-pay

## 2018-07-08 DIAGNOSIS — E785 Hyperlipidemia, unspecified: Secondary | ICD-10-CM

## 2018-07-08 NOTE — Telephone Encounter (Signed)
Called to see how pt was tolerating the repatha and schedule labs pt started repatha last night and scheduled labs for 09/01/17

## 2018-07-10 ENCOUNTER — Other Ambulatory Visit (HOSPITAL_COMMUNITY): Payer: Self-pay | Admitting: Cardiology

## 2018-07-13 ENCOUNTER — Other Ambulatory Visit (INDEPENDENT_AMBULATORY_CARE_PROVIDER_SITE_OTHER): Payer: Medicare Other

## 2018-07-13 DIAGNOSIS — K269 Duodenal ulcer, unspecified as acute or chronic, without hemorrhage or perforation: Secondary | ICD-10-CM

## 2018-07-13 LAB — CBC WITH DIFFERENTIAL/PLATELET
BASOS PCT: 0.8 % (ref 0.0–3.0)
Basophils Absolute: 0.1 10*3/uL (ref 0.0–0.1)
Eosinophils Absolute: 0.1 10*3/uL (ref 0.0–0.7)
Eosinophils Relative: 2 % (ref 0.0–5.0)
HCT: 39.7 % (ref 39.0–52.0)
HEMOGLOBIN: 13.4 g/dL (ref 13.0–17.0)
Lymphocytes Relative: 22.4 % (ref 12.0–46.0)
Lymphs Abs: 1.4 10*3/uL (ref 0.7–4.0)
MCHC: 33.8 g/dL (ref 30.0–36.0)
MCV: 100.9 fl — ABNORMAL HIGH (ref 78.0–100.0)
Monocytes Absolute: 0.7 10*3/uL (ref 0.1–1.0)
Monocytes Relative: 11.1 % (ref 3.0–12.0)
Neutro Abs: 4 10*3/uL (ref 1.4–7.7)
Neutrophils Relative %: 63.7 % (ref 43.0–77.0)
Platelets: 223 10*3/uL (ref 150.0–400.0)
RBC: 3.93 Mil/uL — ABNORMAL LOW (ref 4.22–5.81)
RDW: 15.4 % (ref 11.5–15.5)
WBC: 6.2 10*3/uL (ref 4.0–10.5)

## 2018-07-14 ENCOUNTER — Ambulatory Visit (HOSPITAL_COMMUNITY)
Admission: RE | Admit: 2018-07-14 | Discharge: 2018-07-14 | Disposition: A | Discharge status: Home / Self Care | Payer: Medicare Other | Source: Ambulatory Visit | Attending: Nurse Practitioner | Admitting: Nurse Practitioner

## 2018-07-14 ENCOUNTER — Encounter (HOSPITAL_COMMUNITY): Payer: Self-pay | Admitting: Nurse Practitioner

## 2018-07-14 VITALS — BP 120/72 | HR 56 | Ht 68.0 in | Wt 166.2 lb

## 2018-07-14 DIAGNOSIS — I130 Hypertensive heart and chronic kidney disease with heart failure and stage 1 through stage 4 chronic kidney disease, or unspecified chronic kidney disease: Secondary | ICD-10-CM | POA: Diagnosis not present

## 2018-07-14 DIAGNOSIS — Z87891 Personal history of nicotine dependence: Secondary | ICD-10-CM | POA: Insufficient documentation

## 2018-07-14 DIAGNOSIS — I428 Other cardiomyopathies: Secondary | ICD-10-CM | POA: Diagnosis not present

## 2018-07-14 DIAGNOSIS — K219 Gastro-esophageal reflux disease without esophagitis: Secondary | ICD-10-CM | POA: Diagnosis not present

## 2018-07-14 DIAGNOSIS — N189 Chronic kidney disease, unspecified: Secondary | ICD-10-CM | POA: Diagnosis not present

## 2018-07-14 DIAGNOSIS — I484 Atypical atrial flutter: Secondary | ICD-10-CM | POA: Diagnosis not present

## 2018-07-14 DIAGNOSIS — Z79899 Other long term (current) drug therapy: Secondary | ICD-10-CM | POA: Diagnosis not present

## 2018-07-14 DIAGNOSIS — I447 Left bundle-branch block, unspecified: Secondary | ICD-10-CM | POA: Diagnosis not present

## 2018-07-14 DIAGNOSIS — Z7901 Long term (current) use of anticoagulants: Secondary | ICD-10-CM | POA: Diagnosis not present

## 2018-07-14 DIAGNOSIS — M1990 Unspecified osteoarthritis, unspecified site: Secondary | ICD-10-CM | POA: Diagnosis not present

## 2018-07-14 DIAGNOSIS — I4819 Other persistent atrial fibrillation: Secondary | ICD-10-CM | POA: Insufficient documentation

## 2018-07-14 MED ORDER — CARVEDILOL 6.25 MG PO TABS: 6.2500 mg | ORAL_TABLET | Freq: Two times a day (BID) | ORAL | 6 refills | Status: DC

## 2018-07-14 MED ORDER — TORSEMIDE 20 MG PO TABS: ORAL_TABLET | ORAL | 3 refills | Status: DC

## 2018-07-14 NOTE — Progress Notes (Signed)
Primary Care Physician: [name], MD Referring Physician: Surgical unit   AHF- Dr. Fredda Hammed Travis [name] is a 78 y.o. male with a h/o afib on amiodarone, s/p ablation that is in the afib clinic for presenting with afib with RVR at time of elective  hernia surgery on Thursday. Surgery was cancelled and he was referred to afib clinic. He was off xarelto from Tuesday and restarted Thursday pm.  I discussed with [name] and he felt if he was rate controlled, he could go on with surgery. He had v rate of 113-120 bpm . Pt felt that he went into afib  2/2 recently starting a new statin drug, which he has since stopped. Carvedilol was increased to 12.5 mg bid on Friday.  He returns today and has HR of 97 bpm, still with aflutter with variable rate.  V rate improved but would like for it to be better controlled for surgery. We discussed delaying surgery and pursing TEE DCCV, but he would not be able to interrupt anticoagulation x 4 weeks and he would like to get surgery over with.  F/u in afib clinic 2/19. He saw [name] on f/u and it was decided to cardiovert pt and delay surgery. He had a successful cardioversion and is in Sinus brady today at 47 bpm. He has surgery rescheduled for 5 weeks after cardioversion. Amiodarone was increased to 200 mg daily prior to cardioversion.his torsemide was also increased priorr to cardioversion but his fluid status has returned to normal.  Today, he denies symptoms of palpitations, chest pain, shortness of breath, orthopnea, PND, lower extremity edema, dizziness, presyncope, syncope, or neurologic sequela. The patient is tolerating medications without difficulties and is otherwise without complaint today.   Past Medical History:  Diagnosis Date  . Arthritis   . Biatrial enlargement    severe  . Bicuspid aortic valve   . CHF (congestive heart failure) ([place])    JULY 2014  . Chronic renal insufficiency   . GERD (gastroesophageal reflux disease)   .  History of cardioversion 12/16/2012  . History of stomach ulcers   . Hypertension   . Left bundle branch block   . Nonischemic cardiomyopathy ([place])       . Persistent atrial fibrillation    multiple prior cardioversion  . Sinus bradycardia   . Status post clamping of cerebral aneurysm 80   Past Surgical History:  Procedure Laterality Date  . ATRIAL FIBRILLATION ABLATION N/A 10/06/2017   Procedure: ATRIAL FIBRILLATION ABLATION;  Surgeon: [name], MD;  Location: [place] [facility];  Service: Cardiovascular;  Laterality: N/A;  . BIOPSY  02/06/2018   Procedure: BIOPSY;  Surgeon: Lavena Bullion, DO;  Location: [place] ENDOSCOPY;  Service: Gastroenterology;;  . BIOPSY  02/28/2018   Procedure: BIOPSY;  Surgeon: Mauri Pole, MD;  Location: [place];  Service: Endoscopy;;  . CARDIAC CATHETERIZATION    . CARDIOVERSION N/A 12/16/2012   Procedure: CARDIOVERSION;  Surgeon: Lelon Perla, MD;  Location: [facility] ENDOSCOPY;  Service: Cardiovascular;  Laterality: N/A;  . CARDIOVERSION N/A 08/14/2017   Procedure: CARDIOVERSION;  Surgeon: Josue Hector, MD;  Location: [facility] ENDOSCOPY;  Service: Cardiovascular;  Laterality: N/A;  . CARDIOVERSION N/A 09/21/2017   Procedure: CARDIOVERSION;  Surgeon: Sanda Klein, MD;  Location: [employer] [facility] ENDOSCOPY;  Service: Cardiovascular;  Laterality: N/A;  . CARDIOVERSION N/A 10/13/2017   Procedure: CARDIOVERSION;  Surgeon: Josue Hector, MD;  Location: [place];  Service: Cardiovascular;  Laterality: N/A;  .  CARDIOVERSION N/A 07/05/2018   Procedure: CARDIOVERSION;  Surgeon: Larey Dresser, MD;  Location: [facility] ENDOSCOPY;  Service: Cardiovascular;  Laterality: N/A;  . CATARACT EXTRACTION  05/2015   bilateral  . cerebral anuersym post clips    . ESOPHAGOGASTRODUODENOSCOPY N/A 02/06/2018   Procedure: ESOPHAGOGASTRODUODENOSCOPY (EGD);  Surgeon: Lavena Bullion, DO;  Location: [facility] ENDOSCOPY;  Service: Gastroenterology;  Laterality: N/A;  .  ESOPHAGOGASTRODUODENOSCOPY N/A 02/28/2018   Procedure: ESOPHAGOGASTRODUODENOSCOPY (EGD);  Surgeon: Mauri Pole, MD;  Location: [facility] ENDOSCOPY;  Service: Endoscopy;  Laterality: N/A;  . fractured left arm    . HERNIA REPAIR     lft  . INGUINAL HERNIA REPAIR  07/02/2011   Procedure: LAPAROSCOPIC INGUINAL HERNIA;  Surgeon: Harl Bowie, MD;  Location: [place];  Service: General;  Laterality: Left;  Laparoscopic left inguinal hernia repair and mesh  . left tendon repair     lft foot  . ROTATOR CUFF REPAIR     lf  . TEE WITHOUT CARDIOVERSION N/A 08/14/2017   Procedure: TRANSESOPHAGEAL ECHOCARDIOGRAM (TEE);  Surgeon: Josue Hector, MD;  Location: [facility] ENDOSCOPY;  Service: Cardiovascular;  Laterality: N/A;  . TEE WITHOUT CARDIOVERSION N/A 07/05/2018   Procedure: TRANSESOPHAGEAL ECHOCARDIOGRAM (TEE);  Surgeon: Larey Dresser, MD;  Location: [facility] ENDOSCOPY;  Service: Cardiovascular;  Laterality: N/A;  . TONSILLECTOMY    . TOTAL KNEE ARTHROPLASTY Bilateral 02/06/2014   Procedure: TOTAL KNEE BILATERAL;  Surgeon: Mauri Pole, MD;  Location: [facility];  Service: Orthopedics;  Laterality: Bilateral;  . WRIST GANGLION EXCISION     lft    Current Outpatient Medications  Medication Sig Dispense Refill  . amiodarone (PACERONE) 100 MG tablet Take 2 tablets (200 mg total) by mouth daily. 60 tablet 3  . Carboxymethylcell-Hypromellose (GENTEAL) 0.25-0.3 % GEL Place 1 drop into both eyes daily as needed (dry eyes).     . carvedilol (COREG) 6.25 MG tablet Take 1 tablet (6.25 mg total) by mouth 2 (two) times daily with a meal. 60 tablet 6  . Evolocumab (REPATHA SURECLICK) 277 MG/ML SOAJ Inject 1 pen into the skin every 14 (fourteen) days. 2 pen 11  . finasteride (PROPECIA) 1 MG tablet TAKE 1 TABLET(1 MG) BY MOUTH DAILY (Patient taking differently: Take 1 mg by mouth daily. ) 90 tablet 1  . losartan (COZAAR) 25 MG tablet TAKE 1/2 TABLET(12.5 MG) BY MOUTH DAILY 15 tablet 3  . Multiple Vitamins-Minerals  (OCUVITE PRESERVISION PO) Take 1 tablet by mouth 2 (two) times daily.    . pantoprazole (PROTONIX) 40 MG tablet Take 1 tablet (40 mg total) by mouth 2 (two) times daily. 60 tablet 2  . temazepam (RESTORIL) 15 MG capsule TAKE 1 CAPSULE BY MOUTH EVERY DAY AT BEDTIME AS NEEDED 30 capsule 5  . torsemide (DEMADEX) 20 MG tablet Take 2 tablets in the AM and 1 tablet in the PM 120 tablet 3  . traMADol (ULTRAM) 50 MG tablet Take 1 tablet (50 mg total) by mouth every 6 (six) hours as needed for moderate pain or severe pain (arthritis -hands and neck. 1 month RX). (Patient taking differently: Take 50 mg by mouth 3 (three) times daily. ) 60 tablet 5  . XARELTO 15 MG TABS tablet TAKE 1 TABLET(15 MG) BY MOUTH DAILY WITH SUPPER 60 tablet 6   No current facility-administered medications for this encounter.     No Known Allergies  Social History   Socioeconomic History  . Marital status: Single    Spouse name: Not on file  .  Number of children: 0  . Years of education: Not on file  . Highest education level: Not on file  Occupational History  . Occupation: Retired  Scientific laboratory technician  . Financial resource strain: Not on file  . Food insecurity:    Worry: Not on file    Inability: Not on file  . Transportation needs:    Medical: Not on file    Non-medical: Not on file  Tobacco Use  . Smoking status: Former Smoker    Packs/day: 1.50    Years: 25.00    Pack years: 37.50    Last attempt to quit: 05/26/1986    Years since quitting: 32.1  . Smokeless tobacco: Never Used  Substance and Sexual Activity  . Alcohol use: Not Currently    Alcohol/week: 1.0 standard drinks    Types: 1 Standard drinks or equivalent per week    Comment: stopped drinking   . Drug use: No  . Sexual activity: Not Currently  Lifestyle  . Physical activity:    Days per week: Not on file    Minutes per session: Not on file  . Stress: Not on file  Relationships  . Social connections:    Talks on phone: Not on file    Gets  together: Not on file    Attends religious service: Not on file    Active member of club or organization: Not on file    Attends meetings of clubs or organizations: Not on file    Relationship status: Not on file  . Intimate partner violence:    Fear of current or ex partner: Not on file    Emotionally abused: Not on file    Physically abused: Not on file    Forced sexual activity: Not on file  Other Topics Concern  . Not on file  Social History Narrative   Homosexual. Lives alone. Not sexually active.       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 Stroke Father 48       smoker  . Lung cancer Mother 73       former smoker  . Stroke Mother   . Stroke Brother   . Colon cancer Neg Hx     ROS- All systems are reviewed and negative except as per the HPI above  Physical Exam: Vitals:   07/14/18 0957  BP: 120/72  Pulse: (!) 56  Weight: 75.4 kg  Height: 5\' 8"  (1.727 m)   Wt Readings from Last 3 Encounters:  07/14/18 75.4 kg  07/05/18 77 kg  06/30/18 77 kg    Labs: Lab Results  Component Value Date   NA 136 07/05/2018   K 3.8 07/05/2018   CL 92 (L) 06/22/2018   CO2 28 06/22/2018   GLUCOSE 131 (H) 07/05/2018   BUN 22 06/22/2018   CREATININE 1.15 06/22/2018   CALCIUM 9.0 06/22/2018   MG 2.3 10/23/2017   Lab Results  Component Value Date   INR 1.05 06/24/2018   Lab Results  Component Value Date   CHOL 170 02/04/2018   HDL 56 02/04/2018   LDLCALC 104 (H) 02/04/2018   TRIG 50 02/04/2018     GEN- The patient is well appearing, alert and oriented x 3 today.   Head- normocephalic, atraumatic Eyes-  Sclera clear, conjunctiva pink Ears- hearing intact Oropharynx-  clear Neck- supple, no JVP Lymph- no cervical lymphadenopathy Lungs- Clear to ausculation bilaterally, normal work of breathing Heart- regular rate and  rhythm, no murmurs, rubs or gallops, PMI not laterally displaced GI- soft, NT, ND, + BS Extremities- no clubbing, cyanosis, or edema MS- no significant deformity or atrophy Skin- no rash or lesion Psych- euthymic mood, full affect Neuro- strength and sensation are intact  EKG-Sinus brady at 46 bpm, LBBB pr int 176 ms, qrs int 148 ms, qtc 446 ms    Assessment and Plan: 1. Persistent  afib /flutter S/p successful cardioversion Continue amiodarone at 200 mg daily Continue xarelto at 15 mg daily, appropriately dosed with crcl cal less than 60 mg/dl at 56.38 Decrease coreg to 6.25 mg bid for HR in the 40's continue with plans for hernia surgery 3/17, which  Is over the 4 week period of not interrupting anticoagulation after cardioversion He will go back to his usual torsemide dose,  2 in am and 1 in pm  F/u with [name]  4/6 afib clinic as needed  Butch Penny C. Iris Hairston, [facility] [address] [place], [place] [address] [phone]

## 2018-07-14 NOTE — Patient Instructions (Signed)
Coreg to 6.25mg  twice a day  Toresmide to 2 tabs in the AM and 1 tab in the PM

## 2018-07-19 NOTE — Telephone Encounter (Signed)
Prior authorization through [employer] was APPROVED for [place] and will expire on 12/28/2018.

## 2018-07-20 DIAGNOSIS — L570 Actinic keratosis: Secondary | ICD-10-CM | POA: Diagnosis not present

## 2018-07-20 DIAGNOSIS — D225 Melanocytic nevi of trunk: Secondary | ICD-10-CM | POA: Diagnosis not present

## 2018-07-20 DIAGNOSIS — L812 Freckles: Secondary | ICD-10-CM | POA: Diagnosis not present

## 2018-07-20 DIAGNOSIS — L821 Other seborrheic keratosis: Secondary | ICD-10-CM | POA: Diagnosis not present

## 2018-07-20 DIAGNOSIS — D2261 Melanocytic nevi of right upper limb, including shoulder: Secondary | ICD-10-CM | POA: Diagnosis not present

## 2018-07-23 ENCOUNTER — Telehealth: Payer: Self-pay | Admitting: Family Medicine

## 2018-07-23 NOTE — Telephone Encounter (Signed)
Please advise 

## 2018-07-23 NOTE — Telephone Encounter (Signed)
Copied from [place] [id number]. Topic: General - Other >> Jul 23, 2018 11:12 AM Alfredia Ferguson R wrote: Reason for CRM: Patient called in and stated he wants to increase his quantity of traMADol (ULTRAM) 50 MG tablet to last him 30 days he states the current quantity he has is only lasting him 7 days. I advised patient he may need to come in for a appt to see Dr Yong Channel to discuss med. Patient agreed.

## 2018-07-23 NOTE — Telephone Encounter (Signed)
See note

## 2018-07-26 NOTE — Telephone Encounter (Signed)
I think we should sit down for a visit. Plan had been twice a day on average for the tramadol. It looks like we are scheduled on 08/16/2018- is he ok waiting until then or do we have any openings to see him sooner?

## 2018-07-27 ENCOUNTER — Ambulatory Visit (HOSPITAL_COMMUNITY)
Admission: RE | Admit: 2018-07-27 | Discharge: 2018-07-27 | Disposition: A | Discharge status: Home / Self Care | Payer: Medicare Other | Source: Ambulatory Visit | Attending: Nurse Practitioner | Admitting: Nurse Practitioner

## 2018-07-27 ENCOUNTER — Encounter (HOSPITAL_COMMUNITY): Payer: Self-pay | Admitting: Nurse Practitioner

## 2018-07-27 VITALS — BP 116/76 | HR 106 | Ht 68.0 in | Wt 164.0 lb

## 2018-07-27 DIAGNOSIS — Z8711 Personal history of peptic ulcer disease: Secondary | ICD-10-CM | POA: Diagnosis not present

## 2018-07-27 DIAGNOSIS — I509 Heart failure, unspecified: Secondary | ICD-10-CM | POA: Insufficient documentation

## 2018-07-27 DIAGNOSIS — I4892 Unspecified atrial flutter: Secondary | ICD-10-CM | POA: Diagnosis not present

## 2018-07-27 DIAGNOSIS — I110 Hypertensive heart disease with heart failure: Secondary | ICD-10-CM | POA: Diagnosis not present

## 2018-07-27 DIAGNOSIS — Z7901 Long term (current) use of anticoagulants: Secondary | ICD-10-CM | POA: Insufficient documentation

## 2018-07-27 DIAGNOSIS — N289 Disorder of kidney and ureter, unspecified: Secondary | ICD-10-CM | POA: Insufficient documentation

## 2018-07-27 DIAGNOSIS — I4819 Other persistent atrial fibrillation: Secondary | ICD-10-CM | POA: Diagnosis not present

## 2018-07-27 DIAGNOSIS — I428 Other cardiomyopathies: Secondary | ICD-10-CM | POA: Insufficient documentation

## 2018-07-27 DIAGNOSIS — Q231 Congenital insufficiency of aortic valve: Secondary | ICD-10-CM | POA: Diagnosis not present

## 2018-07-27 DIAGNOSIS — Z87891 Personal history of nicotine dependence: Secondary | ICD-10-CM | POA: Diagnosis not present

## 2018-07-27 DIAGNOSIS — Z823 Family history of stroke: Secondary | ICD-10-CM | POA: Diagnosis not present

## 2018-07-27 DIAGNOSIS — K219 Gastro-esophageal reflux disease without esophagitis: Secondary | ICD-10-CM | POA: Insufficient documentation

## 2018-07-27 DIAGNOSIS — Z79899 Other long term (current) drug therapy: Secondary | ICD-10-CM | POA: Insufficient documentation

## 2018-07-27 DIAGNOSIS — I447 Left bundle-branch block, unspecified: Secondary | ICD-10-CM | POA: Insufficient documentation

## 2018-07-27 NOTE — Progress Notes (Signed)
Primary Care Physician: [name], MD Referring Physician: Surgical unit   AHF- [name] EP: Dr. Massie Bougie Travis [name] is a 78 y.o. male with a h/o afib on amiodarone, s/p ablation, 10/06/17, that is in the afib clinic  for presenting with afib with RVR at time of elective  hernia surgery on Thursday, 06/24/18. Surgery was cancelled and he was referred to afib clinic. He was off xarelto from Tuesday and restarted Thursday pm.  I discussed with [name] and he felt if he was rate controlled, he could go on with surgery. He had v rate of 113-120 bpm . Pt felt that he went into afib  2/2 recently starting a new statin drug, which he has since stopped. Carvedilol was increased to 12.5 mg bid on Friday.  He returned to afib clinic, 06/28/18,  and has HR of 97 bpm, still with aflutter with variable rate.  V rate improved but would like for it to be better controlled for surgery. We discussed delaying surgery and pursing TEE DCCV, but he would not be able to interrupt anticoagulation x 4 weeks and he would like to get surgery over with.  F/u in afib clinic 2/19. He saw [name] on f/u and it was decided to cardiovert pt and delay surgery. He had a successful cardioversion on 07/05/17, and is in Sinus brady today at 47 bpm. He had surgery rescheduled for 5 weeks after cardioversion. Amiodarone was increased to 200 mg daily prior to cardioversion.his torsemide was also increased prior to cardioversion but his fluid status has returned to normal.  F/u in afib clinic, 3/3, to be seen urgently as he went into afib on Saturday. His hernia surgery is scheduled for 3/17. The pt would rather delay his elective hernia surgery and go ahead with a ablation redo. He states that his v rates at home are in the 80's for the most part. He will be 4 week post cardioversion to be eligible to stop anticoagulation 3/10. He understands that he will not be able to have his hernia surgery for 3 months following the  ablation, to avoid interrupting his anticoagulation.  Today, he denies symptoms of palpitations, chest pain, shortness of breath, orthopnea, PND, lower extremity edema, dizziness, presyncope, syncope, or neurologic sequela. The patient is tolerating medications without difficulties and is otherwise without complaint today.   Past Medical History:  Diagnosis Date  . Arthritis   . Biatrial enlargement    severe  . Bicuspid aortic valve   . CHF (congestive heart failure) ([place])    JULY 2014  . Chronic renal insufficiency   . GERD (gastroesophageal reflux disease)   . History of cardioversion 12/16/2012  . History of stomach ulcers   . Hypertension   . Left bundle branch block   . Nonischemic cardiomyopathy ([place])       . Persistent atrial fibrillation    multiple prior cardioversion  . Sinus bradycardia   . Status post clamping of cerebral aneurysm 80   Past Surgical History:  Procedure Laterality Date  . ATRIAL FIBRILLATION ABLATION N/A 10/06/2017   Procedure: ATRIAL FIBRILLATION ABLATION;  Surgeon: [name], MD;  Location: [place] [facility];  Service: Cardiovascular;  Laterality: N/A;  . BIOPSY  02/06/2018   Procedure: BIOPSY;  Surgeon: Lavena Bullion, DO;  Location: [place] ENDOSCOPY;  Service: Gastroenterology;;  . BIOPSY  02/28/2018   Procedure: BIOPSY;  Surgeon: Mauri Pole, MD;  Location: [place] ENDOSCOPY;  Service: Endoscopy;;  .  CARDIAC CATHETERIZATION    . CARDIOVERSION N/A 12/16/2012   Procedure: CARDIOVERSION;  Surgeon: Lelon Perla, MD;  Location: [facility] ENDOSCOPY;  Service: Cardiovascular;  Laterality: N/A;  . CARDIOVERSION N/A 08/14/2017   Procedure: CARDIOVERSION;  Surgeon: Josue Hector, MD;  Location: [facility] ENDOSCOPY;  Service: Cardiovascular;  Laterality: N/A;  . CARDIOVERSION N/A 09/21/2017   Procedure: CARDIOVERSION;  Surgeon: Sanda Klein, MD;  Location: [place] ENDOSCOPY;  Service: Cardiovascular;  Laterality: N/A;  . CARDIOVERSION N/A 10/13/2017   Procedure:  CARDIOVERSION;  Surgeon: Josue Hector, MD;  Location: [facility] ENDOSCOPY;  Service: Cardiovascular;  Laterality: N/A;  . CARDIOVERSION N/A 07/05/2018   Procedure: CARDIOVERSION;  Surgeon: Larey Dresser, MD;  Location: [facility] ENDOSCOPY;  Service: Cardiovascular;  Laterality: N/A;  . CATARACT EXTRACTION  05/2015   bilateral  . cerebral anuersym post clips    . ESOPHAGOGASTRODUODENOSCOPY N/A 02/06/2018   Procedure: ESOPHAGOGASTRODUODENOSCOPY (EGD);  Surgeon: Lavena Bullion, DO;  Location: [facility] ENDOSCOPY;  Service: Gastroenterology;  Laterality: N/A;  . ESOPHAGOGASTRODUODENOSCOPY N/A 02/28/2018   Procedure: ESOPHAGOGASTRODUODENOSCOPY (EGD);  Surgeon: Mauri Pole, MD;  Location: [facility] ENDOSCOPY;  Service: Endoscopy;  Laterality: N/A;  . fractured left arm    . HERNIA REPAIR     lft  . INGUINAL HERNIA REPAIR  07/02/2011   Procedure: LAPAROSCOPIC INGUINAL HERNIA;  Surgeon: Harl Bowie, MD;  Location: [place];  Service: General;  Laterality: Left;  Laparoscopic left inguinal hernia repair and mesh  . left tendon repair     lft foot  . ROTATOR CUFF REPAIR     lf  . TEE WITHOUT CARDIOVERSION N/A 08/14/2017   Procedure: TRANSESOPHAGEAL ECHOCARDIOGRAM (TEE);  Surgeon: Josue Hector, MD;  Location: [facility] ENDOSCOPY;  Service: Cardiovascular;  Laterality: N/A;  . TEE WITHOUT CARDIOVERSION N/A 07/05/2018   Procedure: TRANSESOPHAGEAL ECHOCARDIOGRAM (TEE);  Surgeon: Larey Dresser, MD;  Location: [facility] ENDOSCOPY;  Service: Cardiovascular;  Laterality: N/A;  . TONSILLECTOMY    . TOTAL KNEE ARTHROPLASTY Bilateral 02/06/2014   Procedure: TOTAL KNEE BILATERAL;  Surgeon: Mauri Pole, MD;  Location: [facility];  Service: Orthopedics;  Laterality: Bilateral;  . WRIST GANGLION EXCISION     lft    Current Outpatient Medications  Medication Sig Dispense Refill  . amiodarone (PACERONE) 200 MG tablet Take 200 mg by mouth daily.    . Carboxymethylcell-Hypromellose (GENTEAL) 0.25-0.3 % GEL Place 1 drop into both eyes  daily as needed (dry eyes).     . carvedilol (COREG) 6.25 MG tablet Take 1 tablet (6.25 mg total) by mouth 2 (two) times daily with a meal. 60 tablet 6  . Evolocumab (REPATHA SURECLICK) 742 MG/ML SOAJ Inject 1 pen into the skin every 14 (fourteen) days. 2 pen 11  . finasteride (PROPECIA) 1 MG tablet TAKE 1 TABLET(1 MG) BY MOUTH DAILY (Patient taking differently: Take 1 mg by mouth daily. ) 90 tablet 1  . losartan (COZAAR) 25 MG tablet TAKE 1/2 TABLET(12.5 MG) BY MOUTH DAILY 15 tablet 3  . Multiple Vitamins-Minerals (OCUVITE PRESERVISION PO) Take 1 tablet by mouth 2 (two) times daily.    . pantoprazole (PROTONIX) 40 MG tablet Take 1 tablet (40 mg total) by mouth 2 (two) times daily. 60 tablet 2  . temazepam (RESTORIL) 15 MG capsule TAKE 1 CAPSULE BY MOUTH EVERY DAY AT BEDTIME AS NEEDED 30 capsule 5  . torsemide (DEMADEX) 20 MG tablet Take 2 tablets in the AM and 1 tablet in the PM 120 tablet 3  . traMADol (ULTRAM) 50  MG tablet Take 1 tablet (50 mg total) by mouth every 6 (six) hours as needed for moderate pain or severe pain (arthritis -hands and neck. 1 month RX). (Patient taking differently: Take 50 mg by mouth 3 (three) times daily. ) 60 tablet 5  . XARELTO 15 MG TABS tablet TAKE 1 TABLET(15 MG) BY MOUTH DAILY WITH SUPPER 60 tablet 6   No current facility-administered medications for this encounter.     No Known Allergies  Social History   Socioeconomic History  . Marital status: Single    Spouse name: Not on file  . Number of children: 0  . Years of education: Not on file  . Highest education level: Not on file  Occupational History  . Occupation: Retired  Scientific laboratory technician  . Financial resource strain: Not on file  . Food insecurity:    Worry: Not on file    Inability: Not on file  . Transportation needs:    Medical: Not on file    Non-medical: Not on file  Tobacco Use  . Smoking status: Former Smoker    Packs/day: 1.50    Years: 25.00    Pack years: 37.50    Last attempt to  quit: 05/26/1986    Years since quitting: 32.1  . Smokeless tobacco: Never Used  Substance and Sexual Activity  . Alcohol use: Not Currently    Alcohol/week: 1.0 standard drinks    Types: 1 Standard drinks or equivalent per week    Comment: stopped drinking   . Drug use: No  . Sexual activity: Not Currently  Lifestyle  . Physical activity:    Days per week: Not on file    Minutes per session: Not on file  . Stress: Not on file  Relationships  . Social connections:    Talks on phone: Not on file    Gets together: Not on file    Attends religious service: Not on file    Active member of club or organization: Not on file    Attends meetings of clubs or organizations: Not on file    Relationship status: Not on file  . Intimate partner violence:    Fear of current or ex partner: Not on file    Emotionally abused: Not on file    Physically abused: Not on file    Forced sexual activity: Not on file  Other Topics Concern  . Not on file  Social History Narrative   Homosexual. Lives alone. Not sexually active.       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 Stroke Father 56       smoker  . Lung cancer Mother 20       former smoker  . Stroke Mother   . Stroke Brother   . Colon cancer Neg Hx     ROS- All systems are reviewed and negative except as per the HPI above  Physical Exam: Vitals:   07/27/18 1414  BP: 116/76  Pulse: (!) 106  Weight: 74.4 kg  Height: 5\' 8"  (1.727 m)   Wt Readings from Last 3 Encounters:  07/27/18 74.4 kg  07/14/18 75.4 kg  07/05/18 77 kg    Labs: Lab Results  Component Value Date   NA 136 07/05/2018   K 3.8 07/05/2018   CL 92 (L) 06/22/2018  CO2 28 06/22/2018   GLUCOSE 131 (H) 07/05/2018   BUN 22 06/22/2018   CREATININE 1.15 06/22/2018   CALCIUM 9.0 06/22/2018   MG 2.3 10/23/2017   Lab  Results  Component Value Date   INR 1.05 06/24/2018   Lab Results  Component Value Date   CHOL 170 02/04/2018   HDL 56 02/04/2018   LDLCALC 104 (H) 02/04/2018   TRIG 50 02/04/2018     GEN- The patient is well appearing, alert and oriented x 3 today.   Head- normocephalic, atraumatic Eyes-  Sclera clear, conjunctiva pink Ears- hearing intact Oropharynx- clear Neck- supple, no JVP Lymph- no cervical lymphadenopathy Lungs- Clear to ausculation bilaterally, normal work of breathing Heart- irregular rate and rhythm, no murmurs, rubs or gallops, PMI not laterally displaced GI- soft, NT, ND, + BS Extremities- no clubbing, cyanosis, or edema MS- no significant deformity or atrophy Skin- no rash or lesion Psych- euthymic mood, full affect Neuro- strength and sensation are intact  EKG-afib with RVR, 106 bpm, LBBB   Assessment and Plan: 1. Persistent  afib /flutter Rate controlled via his readings at home S/p successful cardioversion 2/10, but now back out of rhythm  He does not want to take a chance rescheduling surgery and HR be elevated day of surgery and it be cancelled again He would rather consider a redo ablation First available in 08/17/18 He would like I to plan for this date and he is ok for [place] to schedule  vrs going back to talk to [name] He is aware that he will have to delay hernia surgery until 3 months after this, he will notify surgeon Continue amiodarone at 200 mg daily Continue xarelto at 15 mg daily, appropriately dosed with crcl cal less than 60 mg/dl at 56.38 Continue  coreg to 6.25 mg bid  Continue torsemide dose,  2 in am and 1 in pm  Pt will be notified of the details of ablation as the date draws closer  [place]. [place], [facility] [address] [place], [place] [address] [phone]

## 2018-07-27 NOTE — Telephone Encounter (Signed)
Left message to return phone call.

## 2018-07-27 NOTE — Telephone Encounter (Signed)
Patient scheduled for appointment on 08/16/2018.

## 2018-07-28 ENCOUNTER — Telehealth: Payer: Self-pay

## 2018-07-28 DIAGNOSIS — I4819 Other persistent atrial fibrillation: Secondary | ICD-10-CM

## 2018-07-29 NOTE — Telephone Encounter (Signed)
Left message advising Pt of cardiac CT date/time.  Advised all instructions would be at front desk for him to pick up.  Advised Pt call afib clinic if he has any needs coming up

## 2018-07-31 ENCOUNTER — Other Ambulatory Visit: Payer: Self-pay | Admitting: Gastroenterology

## 2018-08-03 ENCOUNTER — Other Ambulatory Visit: Payer: Medicare Other

## 2018-08-04 ENCOUNTER — Encounter: Payer: Self-pay | Admitting: Internal Medicine

## 2018-08-08 ENCOUNTER — Other Ambulatory Visit: Payer: Self-pay | Admitting: Family Medicine

## 2018-08-10 ENCOUNTER — Telehealth: Payer: Self-pay

## 2018-08-10 ENCOUNTER — Ambulatory Visit: Admit: 2018-08-10 | Payer: Medicare Other | Admitting: Surgery

## 2018-08-10 SURGERY — REPAIR, HERNIA, INGUINAL, ADULT
Anesthesia: General | Laterality: Left

## 2018-08-10 NOTE — Telephone Encounter (Signed)
Call placed to Pt.  Left detailed message requesting call back.  Advised at this time-routine follow up appointments are being cancelled to reduce possible exposure for our patients.  Advised to call this nurse back if he would be interested in a telehealth visit.

## 2018-08-10 NOTE — Telephone Encounter (Signed)
Pt would like telehealth call.

## 2018-08-11 ENCOUNTER — Telehealth (INDEPENDENT_AMBULATORY_CARE_PROVIDER_SITE_OTHER): Payer: Medicare Other | Admitting: Internal Medicine

## 2018-08-11 ENCOUNTER — Ambulatory Visit (HOSPITAL_COMMUNITY): Payer: Medicare Other

## 2018-08-11 ENCOUNTER — Ambulatory Visit: Payer: Medicare Other | Admitting: Internal Medicine

## 2018-08-11 ENCOUNTER — Other Ambulatory Visit (HOSPITAL_COMMUNITY): Payer: Self-pay | Admitting: *Deleted

## 2018-08-11 ENCOUNTER — Encounter: Payer: Self-pay | Admitting: Internal Medicine

## 2018-08-11 ENCOUNTER — Other Ambulatory Visit: Payer: Self-pay

## 2018-08-11 DIAGNOSIS — I5022 Chronic systolic (congestive) heart failure: Secondary | ICD-10-CM

## 2018-08-11 DIAGNOSIS — I447 Left bundle-branch block, unspecified: Secondary | ICD-10-CM | POA: Diagnosis not present

## 2018-08-11 DIAGNOSIS — I4819 Other persistent atrial fibrillation: Secondary | ICD-10-CM

## 2018-08-11 MED ORDER — CARVEDILOL 25 MG PO TABS: 25.0000 mg | ORAL_TABLET | Freq: Two times a day (BID) | ORAL | 3 refills | Status: DC

## 2018-08-11 NOTE — Progress Notes (Signed)
Electrophysiology TeleHealth Note  Due to national recommendations of social distancing due to [place] 19, Audio telehealth visit is felt to be most appropriate for this patient at this time.  See MyChart message from today for the patient's consent to telehealth for [facility].  Date:  08/11/2018   ID:  Travis [name], DOB 1940-12-29, MRN [id number]  Location: home  Provider location: [address] [place] Evaluation Performed: Follow-up visit  PCP:  [name], MD  Cardiologist:  Dr Aundra Dubin Primary Electrophysiologist: [name], MD    CC: afib   Travis [name] is a 78 y.o. male who presents via audio conferencing for a telehealth visit today.  Since last being seen in our clinic, the patient reports doing reasonably well.  He is in afib all of the time, but reports that his heart rates are 90s.  He states "I feel like a million bucks, Im fatigued but think that this is due to inactivity".  He states that recent hernia repair was cancelled due to RVR. He has palpitations at night primarily but not when active.   Today, he denies symptoms of chest pain, shortness of breath,  lower extremity edema, dizziness, presyncope, or syncope.  The patient is otherwise without complaint today.  The patient denies symptoms of fevers, chills, cough, or new SOB worrisome for COVID 19.   Past Medical History:  Diagnosis Date  . Arthritis   . Biatrial enlargement    severe  . Bicuspid aortic valve   . CHF (congestive heart failure) ([place])    JULY 2014  . Chronic renal insufficiency   . GERD (gastroesophageal reflux disease)   . History of cardioversion 12/16/2012  . History of stomach ulcers   . Hypertension   . Left bundle branch block   . Nonischemic cardiomyopathy ([place])       . Persistent atrial fibrillation    multiple prior cardioversion  . Sinus bradycardia   . Status post clamping of cerebral aneurysm 80   Past Surgical History:  Procedure Laterality Date  .  ATRIAL FIBRILLATION ABLATION N/A 10/06/2017   Procedure: ATRIAL FIBRILLATION ABLATION;  Surgeon: [name], MD;  Location: [place] [facility];  Service: Cardiovascular;  Laterality: N/A;  . BIOPSY  02/06/2018   Procedure: BIOPSY;  Surgeon: Lavena Bullion, DO;  Location: [place] [facility];  Service: Gastroenterology;;  . BIOPSY  02/28/2018   Procedure: BIOPSY;  Surgeon: Mauri Pole, MD;  Location: [place];  Service: Endoscopy;;  . CARDIAC CATHETERIZATION    . CARDIOVERSION N/A 12/16/2012   Procedure: CARDIOVERSION;  Surgeon: Lelon Perla, MD;  Location: [facility] ENDOSCOPY;  Service: Cardiovascular;  Laterality: N/A;  . CARDIOVERSION N/A 08/14/2017   Procedure: CARDIOVERSION;  Surgeon: Josue Hector, MD;  Location: [facility] [place] [facility]COPY;  Service: Cardiovascular;  Laterality: N/A;  . CARDIOVERSION N/A 09/21/2017   Procedure: CARDIOVERSION;  Surgeon: Sanda Klein, MD;  Location: [place] ENDOSCOPY;  Service: Cardiovascular;  Laterality: N/A;  . CARDIOVERSION N/A 10/13/2017   Procedure: CARDIOVERSION;  Surgeon: Josue Hector, MD;  Location: [facility] ENDOSCOPY;  Service: Cardiovascular;  Laterality: N/A;  . CARDIOVERSION N/A 07/05/2018   Procedure: CARDIOVERSION;  Surgeon: Larey Dresser, MD;  Location: [facility] ENDOSCOPY;  Service: Cardiovascular;  Laterality: N/A;  . CATARACT EXTRACTION  05/2015   bilateral  . cerebral anuersym post clips    . ESOPHAGOGASTRODUODENOSCOPY N/A 02/06/2018   Procedure: ESOPHAGOGASTRODUODENOSCOPY (EGD);  Surgeon: Lavena Bullion, DO;  Location: [place];  Service:  Gastroenterology;  Laterality: N/A;  . ESOPHAGOGASTRODUODENOSCOPY N/A 02/28/2018   Procedure: ESOPHAGOGASTRODUODENOSCOPY (EGD);  Surgeon: Mauri Pole, MD;  Location: [facility] ENDOSCOPY;  Service: Endoscopy;  Laterality: N/A;  . fractured left arm    . HERNIA REPAIR     lft  . INGUINAL HERNIA REPAIR  07/02/2011   Procedure: LAPAROSCOPIC INGUINAL HERNIA;  Surgeon: Harl Bowie, MD;  Location: [place];   Service: General;  Laterality: Left;  Laparoscopic left inguinal hernia repair and mesh  . left tendon repair     lft foot  . ROTATOR CUFF REPAIR     lf  . TEE WITHOUT CARDIOVERSION N/A 08/14/2017   Procedure: TRANSESOPHAGEAL ECHOCARDIOGRAM (TEE);  Surgeon: Josue Hector, MD;  Location: [facility] ENDOSCOPY;  Service: Cardiovascular;  Laterality: N/A;  . TEE WITHOUT CARDIOVERSION N/A 07/05/2018   Procedure: TRANSESOPHAGEAL ECHOCARDIOGRAM (TEE);  Surgeon: Larey Dresser, MD;  Location: [facility] ENDOSCOPY;  Service: Cardiovascular;  Laterality: N/A;  . TONSILLECTOMY    . TOTAL KNEE ARTHROPLASTY Bilateral 02/06/2014   Procedure: TOTAL KNEE BILATERAL;  Surgeon: Mauri Pole, MD;  Location: [facility];  Service: Orthopedics;  Laterality: Bilateral;  . WRIST GANGLION EXCISION     lft     Current Outpatient Medications  Medication Sig Dispense Refill  . carvedilol (COREG) 12.5 MG tablet Take 12.5 mg by mouth 2 (two) times daily with a meal.    . amiodarone (PACERONE) 200 MG tablet Take 200 mg by mouth daily.    . Carboxymethylcell-Hypromellose (GENTEAL) 0.25-0.3 % GEL Place 1 drop into both eyes daily as needed (dry eyes).     . Evolocumab (REPATHA SURECLICK) 462 MG/ML SOAJ Inject 1 pen into the skin every 14 (fourteen) days. 2 pen 11  . finasteride (PROPECIA) 1 MG tablet TAKE 1 TABLET(1 MG) BY MOUTH DAILY (Patient taking differently: Take 1 mg by mouth daily. ) 90 tablet 1  . losartan (COZAAR) 25 MG tablet TAKE 1/2 TABLET(12.5 MG) BY MOUTH DAILY 15 tablet 3  . Multiple Vitamins-Minerals (OCUVITE PRESERVISION PO) Take 1 tablet by mouth 2 (two) times daily.    . pantoprazole (PROTONIX) 40 MG tablet TAKE 1 TABLET(40 MG) BY MOUTH TWICE DAILY 60 tablet 2  . temazepam (RESTORIL) 15 MG capsule TAKE 1 CAPSULE BY MOUTH EVERY DAY AT BEDTIME AS NEEDED 30 capsule 5  . torsemide (DEMADEX) 20 MG tablet Take 2 tablets in the AM and 1 tablet in the PM 120 tablet 3  . traMADol (ULTRAM) 50 MG tablet TAKE 1 TABLET BY MOUTH  EVERY 6 HOURS AS NEEDED FOR MODERATE OR SEVERE PAIN(ARTHIRITIS- HANDS AND NECK) 60 tablet 5  . XARELTO 15 MG TABS tablet TAKE 1 TABLET(15 MG) BY MOUTH DAILY WITH SUPPER 60 tablet 6   No current facility-administered medications for this visit.    Allergies:   Patient has no known allergies.   Social History:  The patient  reports that he quit smoking about 32 years ago. He has a 37.50 pack-year smoking history. He has never used smokeless tobacco. He reports previous alcohol use of about 1.0 standard drinks of alcohol per week. He reports that he does not use drugs.   Family History:  The patient's  family history includes Lung cancer (age of onset: 48) in his mother; Stroke in his brother and mother; Stroke (age of onset: 28) in his father.    ROS:  Please see the history of present illness.   All other systems are personally reviewed and negative.    Recent  Labs: 10/10/2017: B Natriuretic Peptide 3,394.4 10/23/2017: Magnesium 2.3 04/13/2018: ALT 15; TSH 4.370 06/22/2018: BUN 22; Creatinine, Ser 1.15 07/05/2018: Potassium 3.8; Sodium 136 07/13/2018: Hemoglobin 13.4; Platelets 223.0  personally reviewed   Wt Readings from Last 3 Encounters:  07/27/18 164 lb (74.4 kg)  07/14/18 166 lb 3.2 oz (75.4 kg)  07/05/18 169 lb 12.8 oz (77 kg)    BP this am was 115/82 bpm, HR 98  Other studies personally reviewed: Additional studies/ records that were reviewed today include: AF clinic not, my prior ablation note, prior echo 9//04/2019- EF 50%, moderate MR, moderate AI, severe biatrial enlargement ecg 07/27/2018- afib with RVR, LBBB Review of the above records today demonstrates: as above Prior radiographs: cardiac CT from 10/01/17 reveals normal LAA, s/p CABG,   ASSESSMENT AND PLAN:  1.  Persistent afib in the setting of severe biatrial enlargement He has failed catheter ablation.  Options rate control, MAZE, and AV nodal ablation with CRT-P. For now we have decided together to increase coreg to  25mg  BID and follow clinically.  If his V rates continue to be an issue, we will then likely proceed to biv pacemaker with AV nodal ablation at that time. I do think that it would be very safe to proceed with his elective hernia surgery.  He may require perioperative metoprolol as needed.  2. preop Recent hernia surgery delayed due to elevated heart heart I will increase coreg to 25mg  BID.  OK to proceed hernia surgery once Coronaviral restrictions are removed.  May require intraoperative/ perioperative metoprolol.  3.  LBBB, chronic systolic dysfunction EF had improved previously with sinus If we cannot control heart rates as above, will need to consider AV nodal ablation with CRT-P down the road.  4. COVID 19 screen The patient denies symptoms of COVID 19 at this time.  The importance of social distancing was discussed today.  Follow-up:  Return to see me in 2 months He will follow-up in the AF clinic with any concerns in the interim.  Current medicines are reviewed at length with the patient today.   The patient does not have concerns regarding his medicines.  The following changes were made today:  none  Labs/ tests ordered today include:  No orders of the defined types were placed in this encounter.   Patient Risk:  after full review of this patients clinical status, I feel that they are at moderate risk at this time.  Today, I have spent 22 minutes with the patient with telehealth technology discussing AF management .    Signed, [name] MD, [place] 08/11/2018 1:02 PM   [place] [address] [phone] (office) [phone] (fax)

## 2018-08-16 ENCOUNTER — Encounter: Payer: Self-pay | Admitting: Family Medicine

## 2018-08-16 ENCOUNTER — Ambulatory Visit: Payer: Medicare Other | Admitting: Family Medicine

## 2018-08-16 ENCOUNTER — Ambulatory Visit (INDEPENDENT_AMBULATORY_CARE_PROVIDER_SITE_OTHER): Payer: Medicare Other | Admitting: Family Medicine

## 2018-08-16 ENCOUNTER — Telehealth: Payer: Self-pay | Admitting: Family Medicine

## 2018-08-16 VITALS — BP 100/68

## 2018-08-16 DIAGNOSIS — I10 Essential (primary) hypertension: Secondary | ICD-10-CM

## 2018-08-16 DIAGNOSIS — M159 Polyosteoarthritis, unspecified: Secondary | ICD-10-CM

## 2018-08-16 DIAGNOSIS — E785 Hyperlipidemia, unspecified: Secondary | ICD-10-CM

## 2018-08-16 DIAGNOSIS — M150 Primary generalized (osteo)arthritis: Secondary | ICD-10-CM | POA: Diagnosis not present

## 2018-08-16 DIAGNOSIS — I5022 Chronic systolic (congestive) heart failure: Secondary | ICD-10-CM | POA: Diagnosis not present

## 2018-08-16 MED ORDER — TRAMADOL HCL 50 MG PO TABS: ORAL_TABLET | ORAL | 5 refills | Status: DC

## 2018-08-16 NOTE — Progress Notes (Signed)
Phone [phone]   Subjective:  Virtual visit via Video note  I connected with Travis [name] on 08/16/18 at 11:20 AM EDT by phone and verified that I am speaking with the correct person using two identifiers.  Location patient: Home (02) Location provider: [facility], office Persons participating in the virtual visit: patient alone  I discussed the limitations of evaluation and management by telemedicine and the availability of in person appointments. The patient expressed understanding and agreed to proceed.   ROS- continued joint pain. Some dizziness at times.    Past Medical History-  Patient Active Problem List   Diagnosis Date Noted  . GI bleed 02/06/2018    Priority: High  . Chronic systolic heart failure ([place]) 10/10/2017    Priority: High  . Persistent atrial fibrillation 10/06/2017    Priority: High  . Pulmonary hypertension ([place]) 03/09/2012    Priority: High  . Bicuspid aortic valve     Priority: High  . Cardiomyopathy, rate related with resolution now recurrent 12/08/2011    Priority: High  . Osteoarthritis 02/04/2007    Priority: High  . Hyperlipidemia 12/19/2014    Priority: Medium  . Anxiety state 03/06/2014    Priority: Medium  . Insomnia 03/06/2014    Priority: Medium  . Sinus bradycardia 01/04/2013    Priority: Medium  . Hyponatremia 02/21/2012    Priority: Medium  . Hypertension     Priority: Medium  . Left bundle branch block 12/10/2010    Priority: Medium  . Symptomatic anemia 08/01/2015    Priority: Low  . Male pattern baldness 03/06/2014    Priority: Low  . Overweight (BMI 25.0-29.9) 02/08/2014    Priority: Low  . S/P TKR (total knee replacement) 02/08/2014    Priority: Low  . DRY MOUTH 11/07/2009    Priority: Low  . Carotid bruit 07/05/2009    Priority: Low  . GERD 01/07/2008    Priority: Low  . GANGLION CYST, WRIST, LEFT 02/04/2007    Priority: Low  . Acute blood loss anemia 03/01/2018  . Pyloric stenosis, acquired 02/28/2018   . Duodenal ulcer, with obstruction 02/28/2018    Medications- reviewed and updated Current Outpatient Medications  Medication Sig Dispense Refill  . amiodarone (PACERONE) 200 MG tablet Take 200 mg by mouth daily.    . Carboxymethylcell-Hypromellose (GENTEAL) 0.25-0.3 % GEL Place 1 drop into both eyes daily as needed (dry eyes).     . carvedilol (COREG) 25 MG tablet Take 1 tablet (25 mg total) by mouth 2 (two) times daily. 180 tablet 3  . Evolocumab (REPATHA SURECLICK) 308 MG/ML SOAJ Inject 1 pen into the skin every 14 (fourteen) days. 2 pen 11  . finasteride (PROPECIA) 1 MG tablet TAKE 1 TABLET(1 MG) BY MOUTH DAILY (Patient taking differently: Take 1 mg by mouth daily. ) 90 tablet 1  . losartan (COZAAR) 25 MG tablet TAKE 1/2 TABLET(12.5 MG) BY MOUTH DAILY 15 tablet 3  . Multiple Vitamins-Minerals (OCUVITE PRESERVISION PO) Take 1 tablet by mouth 2 (two) times daily.    . pantoprazole (PROTONIX) 40 MG tablet TAKE 1 TABLET(40 MG) BY MOUTH TWICE DAILY 60 tablet 2  . temazepam (RESTORIL) 15 MG capsule TAKE 1 CAPSULE BY MOUTH EVERY DAY AT BEDTIME AS NEEDED 30 capsule 5  . torsemide (DEMADEX) 20 MG tablet Take 2 tablets in the AM and 1 tablet in the PM 120 tablet 3  . traMADol (ULTRAM) 50 MG tablet TAKE 1 TABLET BY MOUTH TID PRN FOR MODERATE OR SEVERE PAIN(ARTHIRITIS-  HANDS AND NECK) 90 tablet 5  . XARELTO 15 MG TABS tablet TAKE 1 TABLET(15 MG) BY MOUTH DAILY WITH SUPPER 60 tablet 6   No current facility-administered medications for this visit.      Objective:  Televisit BP 100/68  Patient with normal nonlabored speech    Assessment and Plan   #hypertension/ heart failure/history cardiomyopathy S: controlled on  coreg 25 mg BID (increase last week)- has noted HR into 70s- and also losartan 12.5 mg daily, torsemide 20 mg two in am and 1 in PM.  100/68  this AM. No reported increased edema or weight gain  Also on xarelto for anticoagulation BP Readings from Last 3 Encounters:  08/16/18  100/68  07/27/18 116/76  07/14/18 120/72  A/P: ideally BP would be higher but with patient needing all meds for history of cardiomyopathy/heart failure did not change dose - updates me may need pacemaker for a fib  #hyperlipidemia S: likely controlled on repatha Lab Results  Component Value Date   CHOL 170 02/04/2018   HDL 56 02/04/2018   LDLCALC 104 (H) 02/04/2018   LDLDIRECT 74.0 07/31/2016   TRIG 50 02/04/2018   CHOLHDL 3.0 02/04/2018   A/P: will get lipids once covid 19 pandemic in better situation but suspect will be controlled.   Osteoarthritis S: Patient is running into issues with his medication- tramadol. Overall if he takes 3x a day states he does really well- not quite as well as mobic but enough to keep pain levels manageable. The problem is he is running out as they are only allowing #60 for 30 days.  A/P: largely stable but needs change in rx- changed to TID with #90 per month. He takes temazepam 6 hours after his last tramadol of day.    6 month follow up reasonable  Future Appointments  Date Time Provider [place]  08/30/2018 10:40 AM Larey Dresser, MD [facility] None  09/02/2018  7:45 AM [facility]  10/11/2018 10:15 AM Allred, Jeneen Rinks, MD [facility]  02/07/2019  2:00 PM [facility] HEALTH COACH [facility]   Lab/Order associations: Chronic systolic heart failure (HCC)  Primary osteoarthritis involving multiple joints  Essential hypertension  Hyperlipidemia, unspecified hyperlipidemia type  Meds ordered this encounter  Medications  . traMADol (ULTRAM) 50 MG tablet    Sig: TAKE 1 TABLET BY MOUTH TID PRN FOR MODERATE OR SEVERE PAIN(ARTHIRITIS- HANDS AND NECK)    Dispense:  90 tablet    Refill:  5   Time based coding Call start 11:28 PM Call end 11:39 PM  Return precautions advised.  [name], MD

## 2018-08-16 NOTE — Telephone Encounter (Signed)
Patient unable to do Webex appointment. Patient does not have a smart phone or computer. Please contact patient to advise.

## 2018-08-16 NOTE — Telephone Encounter (Signed)
This has been resolved. Patient will have a televisit.

## 2018-08-16 NOTE — Patient Instructions (Addendum)
Video visit

## 2018-08-16 NOTE — Assessment & Plan Note (Signed)
S: Patient is running into issues with his medication- tramadol. Overall if he takes 3x a day states he does really well- not quite as well as mobic but enough to keep pain levels manageable. The problem is he is running out as they are only allowing #60 for 30 days.  A/P: largely stable but needs change in rx- changed to TID with #90 per month. He takes temazepam 6 hours after his last tramadol of day.

## 2018-08-17 ENCOUNTER — Ambulatory Visit (HOSPITAL_COMMUNITY): Admit: 2018-08-17 | Payer: Medicare Other | Admitting: Internal Medicine

## 2018-08-17 ENCOUNTER — Encounter (HOSPITAL_COMMUNITY): Payer: Self-pay

## 2018-08-17 SURGERY — ATRIAL FIBRILLATION ABLATION
Anesthesia: General

## 2018-08-30 ENCOUNTER — Encounter (HOSPITAL_COMMUNITY): Payer: Medicare Other | Admitting: Cardiology

## 2018-08-31 ENCOUNTER — Other Ambulatory Visit: Payer: Self-pay | Admitting: Pharmacist

## 2018-08-31 DIAGNOSIS — E785 Hyperlipidemia, unspecified: Secondary | ICD-10-CM

## 2018-09-02 ENCOUNTER — Other Ambulatory Visit: Payer: Medicare Other

## 2018-09-07 ENCOUNTER — Telehealth (HOSPITAL_COMMUNITY): Payer: Self-pay

## 2018-09-07 ENCOUNTER — Ambulatory Visit (HOSPITAL_COMMUNITY)
Admission: RE | Admit: 2018-09-07 | Discharge: 2018-09-07 | Disposition: A | Discharge status: Home / Self Care | Payer: Medicare Other | Source: Ambulatory Visit | Attending: Cardiology | Admitting: Cardiology

## 2018-09-07 ENCOUNTER — Other Ambulatory Visit: Payer: Self-pay

## 2018-09-07 DIAGNOSIS — I5022 Chronic systolic (congestive) heart failure: Secondary | ICD-10-CM

## 2018-09-07 DIAGNOSIS — I4819 Other persistent atrial fibrillation: Secondary | ICD-10-CM

## 2018-09-07 MED ORDER — LOSARTAN POTASSIUM 25 MG PO TABS: 25.0000 mg | ORAL_TABLET | Freq: Every day | ORAL | 3 refills | Status: DC

## 2018-09-07 NOTE — Patient Instructions (Addendum)
1. Increase losartan to 25 mg daily. Confirmed pharm.  2. Labs 1 week: CMET, TSH, CBC, lipids 21 April @945   3. Needs carotid dopplers some time around 6/20 followup carotid stenosis, ref placed.  4. followup 2 months, forwarded to [facility] to schedule

## 2018-09-07 NOTE — Telephone Encounter (Signed)
Reviewed AVS from telehealth visit:  1. Increase losartan to 25 mg daily. Confirmed pharm.  2. Labs 1 week: CMET, TSH, CBC, lipids 21 April @945   3. Needs carotid dopplers some time around 6/20 followup carotid stenosis, ref placed.  4. followup 2 months, forwarded to [facility] to schedule  Pt denied any further questions.

## 2018-09-07 NOTE — Addendum Note (Signed)
Encounter addended by: Larey Dresser, MD on: 09/07/2018 10:26 PM  Actions taken: Charge Capture section accepted

## 2018-09-07 NOTE — Progress Notes (Signed)
Heart Failure TeleHealth Note  Due to national recommendations of social distancing due to [place] 19, Audio telehealth visit is felt to be most appropriate for this patient at this time.  See MyChart message from today for patient consent regarding telehealth for [facility].  Date:  09/07/2018   ID:  Travis [name], DOB 08-16-1940, MRN [id number]  Location: Home  Provider location: [facility] Type of Visit: Established patient   PCP:  [name], MD  Cardiologist: [name] EP: [name]  Chief Complaint: Fatigue   History of Present Illness: Travis [name] is a 78 y.o. male presents via audio conferencing for a telehealth visit today.     he denies symptoms worrisome for COVID 19.   Patient has h/o persistent atrial fibrillation s/p multiple failed DCCV, failed Tikosyn, ERAF after atrial fibrillation ablation, bicuspid AV with moderate AS and Mild AI, HTN, HLD, and chronic systolic CHF (EF 07-62%) with presumed tachy-mediated CMP.   Pt failed DCCV 08/14/17, 08/31/17, and 09/17/17. Failed Tikosyn. Did have conversion with Amiodarone and DCCV. Pt underwent successful ablation 10/07/17.  However, he had ERAF.  Admitted 5/18 - 10/14/17 with atrial fibrillation/RVR and acute on chronic systolic CHF.  Echo showed EF 30-35%. Diuresed and reloaded with Amio. Underwent successful DCCV 10/13/17.    Echo in 9/19 showed improvement in EF to 50% in NSR.   In 9/19, patient reported profound fatigue.  He was volume overloaded on exam and diuretic was increased.  CBC was done, hgb was low and patient ended up going to the ER for admission.  He was diagnosed with UGIB likely from duodenal ulcer, probably due to meloxicam.  This was stopped.   Again in 10/19, he was profoundly fatigued.  CBC showed hgb 6.7, he was sent to the ER where he had 2 unit transfusion and EGD was done again, showing duodenal stenosis.  I also stopped his atorvastatin.   He went back into  atrial fibrillation and had successful TEE-DCCV in 2/20.  TEE showed EF 35-40% with mildly decreased RV systolic function.  In 3/20, he was back in atrial fibrillation and has been in atrial fibrillation since then.  He saw [name] and it was decided not to pursue redo ablation   He continues to need hernia surgery.  He is not walking much because of the painful inguinal hernia.  Surgery has been postponed by coronavirus. He is not short of breath with exertion but is not very active because of hernia pain.  His HR is in the 70s generally in atrial fibrillation.  Weight is stable.  No chest pain.  No orthopnea/PND.    ECG (3/20, personally reviewed): atrial fibrillation, LBBB 148 msec  Labs (3/19): LDL 65 Labs (8/19): K 5.2 => 4.1, creatinine 1.6 Labs (9/19): K 3.9, creatinine 1.78, hgb 9.6, TSH normal.  Labs (10/19): hgb 6.7 => 11.8, LFTs normal, K 3.6, creatinine 1.3 Labs (11/19): LFTs, TSH normal Labs (1/20): K 4, creatinine 1.15, hgb 14.5 Labs (2/20): Hgb 13.4  Past Medical History 1. Chronic systolic CHF: Possible tachy-mediated cardiomyopathy.    - Echo(08/13/2017): LVEF 30-35%, moderate AS, mild AI, severe LAE, RV mild dilated with systolic function mild/mod reduced, PA peak pressure 46 mm Hg. - Echo (9/19): EF 50%, mild diffuse hypokinesis, normal RV size and systolic function, PASP 70 mmHg, moderate AS, moderate AI, moderate MR.  - TEE (2/20): EF 35-40%, mild-moderate LV dilation, normal RV size with mildly decreased systolic  function, mil MR, trileaflet aortic valve with mild AS (mean gradient 10 mmHg, AVA 1.7 cm^2), moderate AI.  2. Atrial fibrillation: Has been difficult to control.  Failed Tikosyn and multiple DCCVs.  Failed amiodarone prior to ablation.  Atrial fibrillation ablation but then had ERAF.  He was cardioverted again in 5/19 with return to NSR.  DCCV again in 2/20 but back in atrial fibrillation in 3/20.  3. Aortic valve disorder: TEE 2/20 with mild AS, moderate AI  (valve is not bicuspid).   4. HTN 5. Hyperlipidemia: Severe fatigue with atorvastatin, myalgias with Crestor.  6. Mitral regurgitation: Mild on last echo.  7. CAD: coronary calcium noted on chest CT.  8. CKD: Stage 3.  9. Carotid stenosis: Carotid dopplers (5/64) with 33-29% LICA stenosis.  10. Chronic LBBB 11. PUD: UGIB in 9/19 with duodenal ulcer probably due to meloxicam use.  - EGD (10/19): Duodenal stenosis.   Current Outpatient Medications  Medication Sig Dispense Refill  . amiodarone (PACERONE) 200 MG tablet Take 200 mg by mouth daily.    . Carboxymethylcell-Hypromellose (GENTEAL) 0.25-0.3 % GEL Place 1 drop into both eyes daily as needed (dry eyes).     . carvedilol (COREG) 25 MG tablet Take 1 tablet (25 mg total) by mouth 2 (two) times daily. 180 tablet 3  . Evolocumab (REPATHA SURECLICK) 518 MG/ML SOAJ Inject 1 pen into the skin every 14 (fourteen) days. 2 pen 11  . finasteride (PROPECIA) 1 MG tablet TAKE 1 TABLET(1 MG) BY MOUTH DAILY (Patient taking differently: Take 1 mg by mouth daily. ) 90 tablet 1  . losartan (COZAAR) 25 MG tablet Take 1 tablet (25 mg total) by mouth daily. 90 tablet 3  . Multiple Vitamins-Minerals (OCUVITE PRESERVISION PO) Take 1 tablet by mouth 2 (two) times daily.    . pantoprazole (PROTONIX) 40 MG tablet TAKE 1 TABLET(40 MG) BY MOUTH TWICE DAILY 60 tablet 2  . temazepam (RESTORIL) 15 MG capsule TAKE 1 CAPSULE BY MOUTH EVERY DAY AT BEDTIME AS NEEDED 30 capsule 5  . torsemide (DEMADEX) 20 MG tablet Take 2 tablets in the AM and 1 tablet in the PM 120 tablet 3  . traMADol (ULTRAM) 50 MG tablet TAKE 1 TABLET BY MOUTH TID PRN FOR MODERATE OR SEVERE PAIN(ARTHIRITIS- HANDS AND NECK) 90 tablet 5  . XARELTO 15 MG TABS tablet TAKE 1 TABLET(15 MG) BY MOUTH DAILY WITH SUPPER 60 tablet 6   No current facility-administered medications for this encounter.     Allergies:   Patient has no known allergies.   Social History:  The patient  reports that he quit smoking  about 32 years ago. He has a 37.50 pack-year smoking history. He has never used smokeless tobacco. He reports previous alcohol use of about 1.0 standard drinks of alcohol per week. He reports that he does not use drugs.   Family History:  The patient's family history includes Lung cancer (age of onset: 107) in his mother; Stroke in his brother and mother; Stroke (age of onset: 66) in his father.   ROS:  Please see the history of present illness.   All other systems are personally reviewed and negative.   Exam:  (Tele Health Call; Exam is subjective and or/visual.) SBP runs 100s on home readings, HR 70s, weight 170 lbs General:  Speaks in full sentences. No resp difficulty. Lungs: Normal respiratory effort with conversation.  Abdomen: Non-distended per patient report Extremities: Pt denies edema. Neuro: Alert & oriented x 3.   Recent Labs:  10/10/2017: B Natriuretic Peptide 3,394.4 10/23/2017: Magnesium 2.3 04/13/2018: ALT 15; TSH 4.370 06/22/2018: BUN 22; Creatinine, Ser 1.15 07/05/2018: Potassium 3.8; Sodium 136 07/13/2018: Hemoglobin 13.4; Platelets 223.0  Personally reviewed   Wt Readings from Last 3 Encounters:  07/27/18 74.4 kg (164 lb)  07/14/18 75.4 kg (166 lb 3.2 oz)  07/05/18 77 kg (169 lb 12.8 oz)      ASSESSMENT AND PLAN:  1. Chronic systolic CHF: Echo 1/47 with EF 30-35%, mild to moderately decreased RV systolic function in setting of atrial fibrillation with RVR.  Echo in 9/19 with patient in NSR showed EF up to 50%, mild diffuse hypokinesis.  Back in atrial fibrillation in 2/20, TEE showed EF 35-40%.  Suspect he has atrial fibrillation/tachycardia-mediated cardiomyopathy.  NYHA class II symptoms.  Weight is stable.  BP is soft.  - Continue torsemide 40 qam/20 qpm. - Increase losartan to 25 mg daily. BMET in 1 week.  - Continue Coreg 25 mg bid.     - Next step will be to add back spironolactone.   -[name] has decided not to repeat atrial fibrillation ablation and has  talked with patient about AV nodal ablation and BiV pacing (has LBBB).  He has followup with [name].   2. Atrial fibrillation: Paroxysmal.  Difficult to control rate and rhythm. He failed Tikosyn. He started on amiodarone, then had atrial fibrillation ablation in 5/19 with ERAF, requiring DCCV.  Most recently cardioverted in 2/20 but back in atrial fibrillation by 3/20 and has stayed in atrial fibrillation.  He saw [name] and they decided against redo ablation. HR better controlled on higher dose Coreg.   - To followup with [name] regarding AV nodal ablation and BiV pacing (has LBBB).  - He will continue amiodarone for now.  Check LFTs/TSH with next lab draw, he will need regular eye exam.  If he remains in atrial fibrillation, should stop amiodarone.  If he has AV nodal ablation and no further attempts at rhythm control, I will stop amiodarone.  - Continue Xarelto 15 mg daily.  Check CBC with labs in 1 week.  3. CAD: Extensive coronary calcification on last CT chest. No chest pain. I suspect that his cardiomyopathy is tachycardia-mediated/atrial fibrillation-related given rise in EF in NSR and then fall when back in atrial fibrillation rather than ischemic cardiomyopathy.  - No ASA with Xarelto use.  - Unable to tolerate statins.  He will continue Repatha. Check lipids with next labs.  4. CKD: Stage 3.  5. Aortic valve disease: TEE in 2/20 showed that aortic valve was not bicuspid but did have mild stenosis and moderate AI.  6. Mitral regurgitation: mild MR 2/20 TEE.   7. LBBB: Chronic.  8. Hyperlipidemia: Unable to tolerate statins.  Now on Repatha.  Check lipids.  9. Hernia: Needs surgery.  No plan for repeat DCCV and rate under better control in atrial fibrillation, so think he can have surgery whenever elective surgeries are restarted.   10. Carotid stenosis: Repeat carotid dopplers in 6/20.   COVID screen The patient does not have any symptoms that suggest any further testing/  screening at this time.  Social distancing reinforced today.  Relevant cardiac medications were reviewed at length with the patient today.   The patient does not have concerns regarding their medications at this time.   Recommended follow-up:  2 months  Today, I have spent 23 minutes with the patient with telehealth technology discussing the above issues .    Signed,  [name], MD  09/07/2018 10:02 PM  [facility] [address] [facility] [place] [address] [phone] (office) [phone] (fax)

## 2018-09-14 ENCOUNTER — Other Ambulatory Visit: Payer: Self-pay

## 2018-09-14 ENCOUNTER — Ambulatory Visit (HOSPITAL_COMMUNITY)
Admission: RE | Admit: 2018-09-14 | Discharge: 2018-09-14 | Disposition: A | Discharge status: Home / Self Care | Payer: Medicare Other | Source: Ambulatory Visit | Attending: Internal Medicine | Admitting: Internal Medicine

## 2018-09-14 DIAGNOSIS — I5022 Chronic systolic (congestive) heart failure: Secondary | ICD-10-CM | POA: Insufficient documentation

## 2018-09-14 LAB — COMPREHENSIVE METABOLIC PANEL
ALT: 14 U/L (ref 0–44)
AST: 20 U/L (ref 15–41)
Albumin: 3.8 g/dL (ref 3.5–5.0)
Alkaline Phosphatase: 95 U/L (ref 38–126)
Anion gap: 11 (ref 5–15)
BUN: 22 mg/dL (ref 8–23)
CO2: 26 mmol/L (ref 22–32)
Calcium: 8.8 mg/dL — ABNORMAL LOW (ref 8.9–10.3)
Chloride: 97 mmol/L — ABNORMAL LOW (ref 98–111)
Creatinine, Ser: 1.35 mg/dL — ABNORMAL HIGH (ref 0.61–1.24)
GFR calc Af Amer: 58 mL/min — ABNORMAL LOW (ref >60)
GFR calc non Af Amer: 50 mL/min — ABNORMAL LOW (ref >60)
Glucose, Bld: 84 mg/dL (ref 70–99)
Potassium: 4 mmol/L (ref 3.5–5.1)
Sodium: 134 mmol/L — ABNORMAL LOW (ref 135–145)
Total Bilirubin: 1 mg/dL (ref 0.3–1.2)
Total Protein: 6.8 g/dL (ref 6.5–8.1)

## 2018-09-14 LAB — LIPID PANEL
Cholesterol: 121 mg/dL (ref 0–200)
HDL: 58 mg/dL (ref >40)
LDL Cholesterol: 48 mg/dL (ref 0–99)
Total CHOL/HDL Ratio: 2.1 RATIO
Triglycerides: 74 mg/dL (ref <150)
VLDL: 15 mg/dL (ref 0–40)

## 2018-09-14 LAB — TSH: TSH: 4.886 u[IU]/mL — ABNORMAL HIGH (ref 0.350–4.500)

## 2018-09-14 LAB — CBC
HCT: 41.3 % (ref 39.0–52.0)
Hemoglobin: 14.1 g/dL (ref 13.0–17.0)
MCH: 34.5 pg — ABNORMAL HIGH (ref 26.0–34.0)
MCHC: 34.1 g/dL (ref 30.0–36.0)
MCV: 101 fL — ABNORMAL HIGH (ref 80.0–100.0)
Platelets: 192 10*3/uL (ref 150–400)
RBC: 4.09 MIL/uL — ABNORMAL LOW (ref 4.22–5.81)
RDW: 14.1 % (ref 11.5–15.5)
WBC: 7.5 10*3/uL (ref 4.0–10.5)
nRBC: 0 % (ref 0.0–0.2)

## 2018-09-15 ENCOUNTER — Telehealth (HOSPITAL_COMMUNITY): Payer: Self-pay

## 2018-09-15 ENCOUNTER — Other Ambulatory Visit (HOSPITAL_COMMUNITY): Payer: Self-pay

## 2018-09-15 DIAGNOSIS — I5022 Chronic systolic (congestive) heart failure: Secondary | ICD-10-CM

## 2018-09-15 NOTE — Telephone Encounter (Signed)
-----   Message from Larey Dresser, MD sent at 09/14/2018  9:36 PM EDT ----- Good labs except TSH mildly elevated.  Repeat with next labs.

## 2018-09-15 NOTE — Telephone Encounter (Signed)
Pt made aware, will recheck next cardiologist appt 5/18, orders placed for TSH lab

## 2018-09-22 ENCOUNTER — Other Ambulatory Visit: Payer: Self-pay | Admitting: Family Medicine

## 2018-09-24 ENCOUNTER — Other Ambulatory Visit: Payer: Self-pay | Admitting: Cardiology

## 2018-09-29 ENCOUNTER — Telehealth: Payer: Self-pay

## 2018-09-29 ENCOUNTER — Other Ambulatory Visit (HOSPITAL_COMMUNITY): Payer: Self-pay | Admitting: Cardiology

## 2018-09-29 NOTE — Telephone Encounter (Signed)
Sheliah Plane Key: [id number] Need help? Call us at [phone]  Status  Additional Information Required  DrugFinasteride 1MG  tablets  FormBlue Cross [place] Medicare Part D General Authorization Form

## 2018-10-07 ENCOUNTER — Telehealth: Payer: Self-pay

## 2018-10-07 NOTE — Telephone Encounter (Signed)
Spoke with pt regarding appt on 10/11/18.Pt stated he would rather do a phone visit. Pt was offered help with MyChart setup, but declined. Pt was advise to check vitals prior to appt. Pt questions and concerns were address.

## 2018-10-07 NOTE — Telephone Encounter (Signed)
Left message regarding appt on 10/11/18. 

## 2018-10-11 ENCOUNTER — Other Ambulatory Visit: Payer: Self-pay

## 2018-10-11 ENCOUNTER — Telehealth (INDEPENDENT_AMBULATORY_CARE_PROVIDER_SITE_OTHER): Payer: Medicare Other | Admitting: Internal Medicine

## 2018-10-11 ENCOUNTER — Encounter: Payer: Self-pay | Admitting: Internal Medicine

## 2018-10-11 DIAGNOSIS — I447 Left bundle-branch block, unspecified: Secondary | ICD-10-CM

## 2018-10-11 DIAGNOSIS — I5022 Chronic systolic (congestive) heart failure: Secondary | ICD-10-CM

## 2018-10-11 DIAGNOSIS — I4819 Other persistent atrial fibrillation: Secondary | ICD-10-CM | POA: Diagnosis not present

## 2018-10-11 NOTE — Progress Notes (Signed)
Electrophysiology TeleHealth Note   Due to national recommendations of social distancing due to COVID 19, an audio/video telehealth visit is felt to be most appropriate for this patient at this time.  Verbal consent was obtained from the patient today.  Facetime was used for this encounter.   Date:  10/11/2018   ID:  Travis [name], DOB 1940/06/26, MRN [id number]  Location: patient's home  Provider location: [address] [place]  Evaluation Performed: Follow-up visit  PCP:  [name], MD  Cardiologist:   Aundra Dubin Electrophysiologist:  Dr Rayann Heman  Chief Complaint:  afib  History of Present Illness:    Travis [name] is a 78 y.o. male who presents via audio/video conferencing for a telehealth visit today.  Since last being seen in our clinic, the patient reports doing very well.  energy is good.  His primary concern is with his hernia.  Heart rates are "always" 70s or below.  Denies RVR.  Today, he denies symptoms of palpitations, chest pain, shortness of breath,  lower extremity edema, dizziness, presyncope, or syncope.  The patient is otherwise without complaint today.  The patient denies symptoms of fevers, chills, cough, or new SOB worrisome for COVID 19.  Past Medical History:  Diagnosis Date  . Arthritis   . Biatrial enlargement    severe  . Bicuspid aortic valve   . CHF (congestive heart failure) ([place])    JULY 2014  . Chronic renal insufficiency   . GERD (gastroesophageal reflux disease)   . History of cardioversion 12/16/2012  . History of stomach ulcers   . Hypertension   . Left bundle branch block   . Nonischemic cardiomyopathy ([place])       . Persistent atrial fibrillation    multiple prior cardioversion  . Sinus bradycardia   . Status post clamping of cerebral aneurysm 80    Past Surgical History:  Procedure Laterality Date  . ATRIAL FIBRILLATION ABLATION N/A 10/06/2017   Procedure: ATRIAL FIBRILLATION ABLATION;  Surgeon: [name],  MD;  Location: [facility];  Service: Cardiovascular;  Laterality: N/A;  . BIOPSY  02/06/2018   Procedure: BIOPSY;  Surgeon: Lavena Bullion, DO;  Location: [facility];  Service: Gastroenterology;;  . BIOPSY  02/28/2018   Procedure: BIOPSY;  Surgeon: Mauri Pole, MD;  Location: [facility];  Service: Endoscopy;;  . CARDIAC CATHETERIZATION    . CARDIOVERSION N/A 12/16/2012   Procedure: CARDIOVERSION;  Surgeon: Lelon Perla, MD;  Location: [facility] ENDOSCOPY;  Service: Cardiovascular;  Laterality: N/A;  . CARDIOVERSION N/A 08/14/2017   Procedure: CARDIOVERSION;  Surgeon: Josue Hector, MD;  Location: [facility] ENDOSCOPY;  Service: Cardiovascular;  Laterality: N/A;  . CARDIOVERSION N/A 09/21/2017   Procedure: CARDIOVERSION;  Surgeon: Sanda Klein, MD;  Location: [facility] ENDOSCOPY;  Service: Cardiovascular;  Laterality: N/A;  . CARDIOVERSION N/A 10/13/2017   Procedure: CARDIOVERSION;  Surgeon: Josue Hector, MD;  Location: [facility] ENDOSCOPY;  Service: Cardiovascular;  Laterality: N/A;  . CARDIOVERSION N/A 07/05/2018   Procedure: CARDIOVERSION;  Surgeon: Larey Dresser, MD;  Location: [facility] ENDOSCOPY;  Service: Cardiovascular;  Laterality: N/A;  . CATARACT EXTRACTION  05/2015   bilateral  . cerebral anuersym post clips    . ESOPHAGOGASTRODUODENOSCOPY N/A 02/06/2018   Procedure: ESOPHAGOGASTRODUODENOSCOPY (EGD);  Surgeon: Lavena Bullion, DO;  Location[facility] ENDOSCOPY;  Service: Gastroenterology;  Laterality: N/A;  . ESOPHAGOGASTRODUODENOSCOPY N/A 02/28/2018   Procedure: ESOPHAGOGASTRODUODENOSCOPY (EGD);  Surgeon: Mauri Pole, MD;  Location: [facility];  Service: Endoscopy;  Laterality: N/A;  . fractured left arm    . HERNIA REPAIR     lft  . INGUINAL HERNIA REPAIR  07/02/2011   Procedure: LAPAROSCOPIC INGUINAL HERNIA;  Surgeon: Harl Bowie, MD;  Location: [facility];  Service: General;  Laterality: Left;  Laparoscopic left inguinal hernia repair and mesh  . left tendon repair     lft  foot  . ROTATOR CUFF REPAIR     lf  . TEE WITHOUT CARDIOVERSION N/A 08/14/2017   Procedure: TRANSESOPHAGEAL ECHOCARDIOGRAM (TEE);  Surgeon: Josue Hector, MD;  Location: [facility] ENDOSCOPY;  Service: Cardiovascular;  Laterality: N/A;  . TEE WITHOUT CARDIOVERSION N/A 07/05/2018   Procedure: TRANSESOPHAGEAL ECHOCARDIOGRAM (TEE);  Surgeon: Larey Dresser, MD;  Location: [facility] ENDOSCOPY;  Service: Cardiovascular;  Laterality: N/A;  . TONSILLECTOMY    . TOTAL KNEE ARTHROPLASTY Bilateral 02/06/2014   Procedure: TOTAL KNEE BILATERAL;  Surgeon: Mauri Pole, MD;  Location: [facility];  Service: Orthopedics;  Laterality: Bilateral;  . WRIST GANGLION EXCISION     lft    Current Outpatient Medications  Medication Sig Dispense Refill  . amiodarone (PACERONE) 200 MG tablet Take 200 mg by mouth daily.    . Carboxymethylcell-Hypromellose (GENTEAL) 0.25-0.3 % GEL Place 1 drop into both eyes daily as needed (dry eyes).     . carvedilol (COREG) 25 MG tablet Take 1 tablet (25 mg total) by mouth 2 (two) times daily. 180 tablet 3  . Evolocumab (REPATHA SURECLICK) 841 MG/ML SOAJ Inject 1 pen into the skin every 14 (fourteen) days. 2 pen 11  . finasteride (PROPECIA) 1 MG tablet TAKE 1 TABLET(1 MG) BY MOUTH DAILY 90 tablet 1  . losartan (COZAAR) 25 MG tablet Take 1 tablet (25 mg total) by mouth daily. 90 tablet 3  . Multiple Vitamins-Minerals (OCUVITE PRESERVISION PO) Take 1 tablet by mouth 2 (two) times daily.    . pantoprazole (PROTONIX) 40 MG tablet TAKE 1 TABLET(40 MG) BY MOUTH TWICE DAILY 60 tablet 2  . temazepam (RESTORIL) 15 MG capsule TAKE 1 CAPSULE BY MOUTH EVERY DAY AT BEDTIME AS NEEDED 30 capsule 5  . torsemide (DEMADEX) 20 MG tablet TAKE 2 TABLETS BY MOUTH EVERY MORNING AND 1 TABLET AT NIGHT 270 tablet 0  . traMADol (ULTRAM) 50 MG tablet TAKE 1 TABLET BY MOUTH TID PRN FOR MODERATE OR SEVERE PAIN(ARTHIRITIS- HANDS AND NECK) 90 tablet 5  . XARELTO 15 MG TABS tablet TAKE 1 TABLET(15 MG) BY MOUTH DAILY WITH SUPPER  60 tablet 6   No current facility-administered medications for this visit.     Allergies:   Patient has no known allergies.   Social History:  The patient  reports that he quit smoking about 32 years ago. He has a 37.50 pack-year smoking history. He has never used smokeless tobacco. He reports previous alcohol use of about 1.0 standard drinks of alcohol per week. He reports that he does not use drugs.   Family History:  The patient's  family history includes Lung cancer (age of onset: 5) in his mother; Stroke in his brother and mother; Stroke (age of onset: 9) in his father.   ROS:  Please see the history of present illness.   All other systems are personally reviewed and negative.    Exam:    Vital Signs:  BP 119/87   Pulse 76   Ht 5\' 8"  (1.727 m)   Wt 158 lb (71.7 kg)   SpO2 93%   BMI 24.02 kg/m  Well appearing, alert and conversant, regular work of breathing,  good skin color Eyes- anicteric, neuro- grossly intact, skin- no apparent rash or lesions or cyanosis, mouth- oral mucosa is pink   Labs/Other Tests and Data Reviewed:    Recent Labs: 10/23/2017: Magnesium 2.3 09/14/2018: ALT 14; BUN 22; Creatinine, Ser 1.35; Hemoglobin 14.1; Platelets 192; Potassium 4.0; Sodium 134; TSH 4.886   Wt Readings from Last 3 Encounters:  10/11/18 158 lb (71.7 kg)  07/27/18 164 lb (74.4 kg)  07/14/18 166 lb 3.2 oz (75.4 kg)     Other studies personally reviewed: Additional studies/ records that were reviewed today include: my prior records, prior echo Review of the above records today demonstrates: as above   ASSESSMENT & PLAN:    1.  Persistent afib Severe biatrial enlargement.  He has failed ablation. Doing very well with rate control.  No indication for AV nodal ablation at this time. Continue his current approach.  2. LBBB/ chronic systolic dysfunction Last EF 50% Continue medical therapy Repeat echo in 3 months.  Could consider CRT-P if his EF drops and he develops  worsening CHF.  For now, I would not advise any additional EP procedures. He is clinically doing very well  3. preop He is worried about his hernia I have advised that it is ok to proceed with surgery from my standpoint.  perioperiatve beta blocker therapy is advised  4. COVID 19 screen The patient denies symptoms of COVID 19 at this time.  The importance of social distancing was discussed today.  Follow-up:  3 months with me with an echo prior to the visit  Current medicines are reviewed at length with the patient today.   The patient does not have concerns regarding his medicines.  The following changes were made today:  none  Labs/ tests ordered today include:  No orders of the defined types were placed in this encounter.   Patient Risk:  after full review of this patients clinical status, I feel that they are at moderate risk at this time.  Today, I have spent 20 minutes with the patient with telehealth technology discussing afib .    Army Fossa, MD  10/11/2018 10:29 AM     [facility] [address] [place] [address] [phone] (office) [phone] (fax)

## 2018-10-11 NOTE — Progress Notes (Signed)
Per [name]- ECHO in 3 months prior to OV

## 2018-10-26 ENCOUNTER — Other Ambulatory Visit: Payer: Self-pay | Admitting: Gastroenterology

## 2018-10-28 DIAGNOSIS — Z961 Presence of intraocular lens: Secondary | ICD-10-CM | POA: Diagnosis not present

## 2018-11-03 ENCOUNTER — Other Ambulatory Visit: Payer: Self-pay | Admitting: Surgery

## 2018-11-05 ENCOUNTER — Telehealth: Payer: Self-pay

## 2018-11-05 NOTE — Telephone Encounter (Signed)
    COVID-19 Pre-Screening Questions:  . In the past 7 to 10 days have you had a cough,  shortness of breath, headache, congestion, fever (100 or greater) body aches, chills, sore throat, or sudden loss of taste or sense of smell? . Have you been around anyone with known Covid 19. . Have you been around anyone who is awaiting Covid 19 test results in the past 7 to 10 days? . Have you been around anyone who has been exposed to Covid 19, or has mentioned symptoms of Covid 19 within the past 7 to 10 days?  If you have any concerns/questions about symptoms patients report during screening (either on the phone or at threshold). Contact the provider seeing the patient or DOD for further guidance.  If neither are available contact a member of the leadership team.          Pt answered NO to all pre-screening questions. [id number] 11/05/2018

## 2018-11-08 ENCOUNTER — Other Ambulatory Visit: Payer: Medicare Other | Admitting: *Deleted

## 2018-11-08 ENCOUNTER — Other Ambulatory Visit: Payer: Self-pay

## 2018-11-08 DIAGNOSIS — E785 Hyperlipidemia, unspecified: Secondary | ICD-10-CM

## 2018-11-08 LAB — LIPID PANEL
Chol/HDL Ratio: 1.8 ratio (ref 0.0–5.0)
Cholesterol, Total: 118 mg/dL (ref 100–199)
HDL: 64 mg/dL (ref >39)
LDL Calculated: 38 mg/dL (ref 0–99)
Triglycerides: 81 mg/dL (ref 0–149)
VLDL Cholesterol Cal: 16 mg/dL (ref 5–40)

## 2018-11-08 LAB — HEPATIC FUNCTION PANEL
ALT: 14 IU/L (ref 0–44)
AST: 17 IU/L (ref 0–40)
Albumin: 4.3 g/dL (ref 3.7–4.7)
Alkaline Phosphatase: 118 IU/L — ABNORMAL HIGH (ref 39–117)
Bilirubin Total: 0.8 mg/dL (ref 0.0–1.2)
Bilirubin, Direct: 0.31 mg/dL (ref 0.00–0.40)
Total Protein: 7 g/dL (ref 6.0–8.5)

## 2018-11-09 ENCOUNTER — Other Ambulatory Visit: Payer: Self-pay | Admitting: Family Medicine

## 2018-11-09 NOTE — Telephone Encounter (Signed)
Last OV 08/16/18 Last refill 04/26/18 #30/5 Next OV 02/07/19

## 2018-11-19 ENCOUNTER — Other Ambulatory Visit (HOSPITAL_COMMUNITY): Payer: Medicare Other

## 2018-11-22 ENCOUNTER — Other Ambulatory Visit (HOSPITAL_COMMUNITY)
Admission: RE | Admit: 2018-11-22 | Discharge: 2018-11-22 | Disposition: A | Discharge status: Home / Self Care | Payer: Medicare Other | Source: Ambulatory Visit | Attending: Surgery | Admitting: Surgery

## 2018-11-22 ENCOUNTER — Encounter (HOSPITAL_COMMUNITY)
Admission: RE | Admit: 2018-11-22 | Discharge: 2018-11-22 | Disposition: A | Discharge status: Home / Self Care | Payer: Medicare Other | Source: Ambulatory Visit | Attending: Surgery | Admitting: Surgery

## 2018-11-22 ENCOUNTER — Encounter (HOSPITAL_COMMUNITY): Payer: Self-pay

## 2018-11-22 ENCOUNTER — Other Ambulatory Visit: Payer: Self-pay

## 2018-11-22 DIAGNOSIS — N183 Chronic kidney disease, stage 3 (moderate): Secondary | ICD-10-CM | POA: Diagnosis not present

## 2018-11-22 DIAGNOSIS — Z79899 Other long term (current) drug therapy: Secondary | ICD-10-CM | POA: Diagnosis not present

## 2018-11-22 DIAGNOSIS — Z87891 Personal history of nicotine dependence: Secondary | ICD-10-CM | POA: Diagnosis not present

## 2018-11-22 DIAGNOSIS — I5022 Chronic systolic (congestive) heart failure: Secondary | ICD-10-CM | POA: Diagnosis not present

## 2018-11-22 DIAGNOSIS — I340 Nonrheumatic mitral (valve) insufficiency: Secondary | ICD-10-CM | POA: Diagnosis not present

## 2018-11-22 DIAGNOSIS — Z1159 Encounter for screening for other viral diseases: Secondary | ICD-10-CM | POA: Diagnosis not present

## 2018-11-22 DIAGNOSIS — I447 Left bundle-branch block, unspecified: Secondary | ICD-10-CM | POA: Diagnosis not present

## 2018-11-22 DIAGNOSIS — I480 Paroxysmal atrial fibrillation: Secondary | ICD-10-CM | POA: Diagnosis not present

## 2018-11-22 DIAGNOSIS — E785 Hyperlipidemia, unspecified: Secondary | ICD-10-CM | POA: Diagnosis not present

## 2018-11-22 DIAGNOSIS — I2510 Atherosclerotic heart disease of native coronary artery without angina pectoris: Secondary | ICD-10-CM | POA: Diagnosis not present

## 2018-11-22 DIAGNOSIS — Z7901 Long term (current) use of anticoagulants: Secondary | ICD-10-CM | POA: Diagnosis not present

## 2018-11-22 DIAGNOSIS — Z9889 Other specified postprocedural states: Secondary | ICD-10-CM | POA: Diagnosis not present

## 2018-11-22 DIAGNOSIS — Z8711 Personal history of peptic ulcer disease: Secondary | ICD-10-CM | POA: Diagnosis not present

## 2018-11-22 DIAGNOSIS — I130 Hypertensive heart and chronic kidney disease with heart failure and stage 1 through stage 4 chronic kidney disease, or unspecified chronic kidney disease: Secondary | ICD-10-CM | POA: Diagnosis not present

## 2018-11-22 LAB — CBC
HCT: 43.5 % (ref 39.0–52.0)
Hemoglobin: 14.7 g/dL (ref 13.0–17.0)
MCH: 34.4 pg — ABNORMAL HIGH (ref 26.0–34.0)
MCHC: 33.8 g/dL (ref 30.0–36.0)
MCV: 101.9 fL — ABNORMAL HIGH (ref 80.0–100.0)
Platelets: 180 10*3/uL (ref 150–400)
RBC: 4.27 MIL/uL (ref 4.22–5.81)
RDW: 14.7 % (ref 11.5–15.5)
WBC: 5.7 10*3/uL (ref 4.0–10.5)
nRBC: 0 % (ref 0.0–0.2)

## 2018-11-22 LAB — BASIC METABOLIC PANEL
Anion gap: 10 (ref 5–15)
BUN: 26 mg/dL — ABNORMAL HIGH (ref 8–23)
CO2: 28 mmol/L (ref 22–32)
Calcium: 9 mg/dL (ref 8.9–10.3)
Chloride: 98 mmol/L (ref 98–111)
Creatinine, Ser: 1.45 mg/dL — ABNORMAL HIGH (ref 0.61–1.24)
GFR calc Af Amer: 53 mL/min — ABNORMAL LOW (ref >60)
GFR calc non Af Amer: 46 mL/min — ABNORMAL LOW (ref >60)
Glucose, Bld: 150 mg/dL — ABNORMAL HIGH (ref 70–99)
Potassium: 4.2 mmol/L (ref 3.5–5.1)
Sodium: 136 mmol/L (ref 135–145)

## 2018-11-22 LAB — SARS CORONAVIRUS 2 (TAT 6-24 HRS): SARS Coronavirus 2: NEGATIVE

## 2018-11-22 NOTE — Progress Notes (Signed)
[id number] [place], [place] [address] [place] [address] Phone: [phone] Fax: [phone]  [place], [place] [address] [place] [address] Phone: [phone] Fax: [phone]      Your procedure is scheduled on July 1  Report to [facility] Main Entrance "A" at [place]M., and check in at the Admitting office.  Call this number if you have problems the morning of surgery:  [phone]  Call [phone] if you have any questions prior to your surgery date Monday-Friday 8am-4pm    Remember:  Do not eat after midnight.  You may drink clear liquids until 0530 am the morning of surgery.   Clear liquids allowed are: Water, Non-Citrus Juices (without pulp), Carbonated Beverages, Clear Tea, Black Coffee Only, and Gatorade    Take these medicines the morning of surgery with A SIP OF WATER   amiodarone (PACERONE) carvedilol (COREG) pantoprazole (PROTONIX) traMADol (ULTRAM  Follow your surgeon's instructions on when to stop XARELTO.  If no instructions were given by your surgeon then you will need to call the office to get those instructions.    7 days prior to surgery STOP taking any Aspirin (unless otherwise instructed by your surgeon), Aleve, Naproxen, Ibuprofen, Motrin, Advil, Goody's, BC's, all herbal medications, fish oil, and all vitamins.    The Morning of Surgery  Do not wear jewelry.  Do not wear lotions, powders, or perfumes/colognes, or deodorant  Men may shave face and neck.  Do not bring valuables to the hospital.  [facility] is not responsible for any belongings or valuables.  If you are a smoker, DO NOT Smoke 24 hours prior to surgery IF you wear a CPAP at night please bring your mask, tubing, and machine the morning of surgery   Remember that you must have someone to  transport you home after your surgery, and remain with you for 24 hours if you are discharged the same day.   Contacts, glasses, hearing aids, dentures or bridgework may not be worn into surgery.    Leave your suitcase in the car.  After surgery it may be brought to your room.  For patients admitted to the hospital, discharge time will be determined by your treatment team.  Patients discharged the day of surgery will not be allowed to drive home.    Special instructions:   [place]- Preparing For Surgery  Before surgery, you can play an important role. Because skin is not sterile, your skin needs to be as free of germs as possible. You can reduce the number of germs on your skin by washing with CHG (chlorahexidine gluconate) Soap before surgery.  CHG is an antiseptic cleaner which kills germs and bonds with the skin to continue killing germs even after washing.    Oral Hygiene is also important to reduce your risk of infection.  Remember - BRUSH YOUR TEETH THE MORNING OF SURGERY WITH YOUR REGULAR TOOTHPASTE  Please do not use if you have an allergy to CHG or antibacterial soaps. If your skin becomes reddened/irritated stop using the CHG.  Do not shave (including legs and underarms) for at least 48 hours prior to first CHG shower. It is OK to shave your face.  Please follow these instructions carefully.   1. Shower the NIGHT BEFORE SURGERY and the MORNING OF SURGERY  with CHG Soap.   2. If you chose to wash your hair, wash your hair first as usual with your normal shampoo.  3. After you shampoo, rinse your hair and body thoroughly to remove the shampoo.  4. Use CHG as you would any other liquid soap. You can apply CHG directly to the skin and wash gently with a scrungie or a clean washcloth.   5. Apply the CHG Soap to your body ONLY FROM THE NECK DOWN.  Do not use on open wounds or open sores. Avoid contact with your eyes, ears, mouth and genitals (private parts). Wash Face and  genitals (private parts)  with your normal soap.   6. Wash thoroughly, paying special attention to the area where your surgery will be performed.  7. Thoroughly rinse your body with warm water from the neck down.  8. DO NOT shower/wash with your normal soap after using and rinsing off the CHG Soap.  9. Pat yourself dry with a CLEAN TOWEL.  10. Wear CLEAN PAJAMAS to bed the night before surgery, wear comfortable clothes the morning of surgery  11. Place CLEAN SHEETS on your bed the night of your first shower and DO NOT SLEEP WITH PETS.    Day of Surgery:  Do not apply any deodorants/lotions.  Please wear clean clothes to the hospital/surgery center.   Remember to brush your teeth WITH YOUR REGULAR TOOTHPASTE.   Please read over the following fact sheets that you were given.

## 2018-11-22 NOTE — Progress Notes (Signed)
PCP - Annie Main hunter Cardiologist - Aundra Dubin Ep - [place]  Chest x-ray - not needed EKG - 07/27/18 Stress Test -denies  ECHO - 10/11/18 Cardiac Cath - denies    Blood Thinner Instructions: contacted [place] office for instructions, spoke to Alvarado who stated that she will call patient with those instructions Aspirin Instructions:n/a  Anesthesia review: yes, hx a fib  Patient denies shortness of breath, fever, cough and chest pain at PAT appointment   Patient verbalized understanding of instructions that were given to them at the PAT appointment. Patient was also instructed that they will need to review over the PAT instructions again at home before surgery.

## 2018-11-23 ENCOUNTER — Other Ambulatory Visit (HOSPITAL_COMMUNITY): Payer: Self-pay | Admitting: Cardiology

## 2018-11-23 ENCOUNTER — Encounter (HOSPITAL_COMMUNITY): Payer: Self-pay | Admitting: Cardiology

## 2018-11-23 ENCOUNTER — Ambulatory Visit (HOSPITAL_COMMUNITY)
Admission: RE | Admit: 2018-11-23 | Discharge: 2018-11-23 | Disposition: A | Discharge status: Home / Self Care | Payer: Medicare Other | Source: Ambulatory Visit | Attending: Cardiology | Admitting: Cardiology

## 2018-11-23 ENCOUNTER — Other Ambulatory Visit: Payer: Self-pay

## 2018-11-23 VITALS — BP 118/81 | HR 123 | Wt 164.0 lb

## 2018-11-23 DIAGNOSIS — I4819 Other persistent atrial fibrillation: Secondary | ICD-10-CM | POA: Diagnosis not present

## 2018-11-23 DIAGNOSIS — I480 Paroxysmal atrial fibrillation: Secondary | ICD-10-CM | POA: Diagnosis not present

## 2018-11-23 DIAGNOSIS — I130 Hypertensive heart and chronic kidney disease with heart failure and stage 1 through stage 4 chronic kidney disease, or unspecified chronic kidney disease: Secondary | ICD-10-CM | POA: Diagnosis not present

## 2018-11-23 DIAGNOSIS — I2510 Atherosclerotic heart disease of native coronary artery without angina pectoris: Secondary | ICD-10-CM | POA: Diagnosis not present

## 2018-11-23 DIAGNOSIS — I340 Nonrheumatic mitral (valve) insufficiency: Secondary | ICD-10-CM | POA: Insufficient documentation

## 2018-11-23 DIAGNOSIS — Z8711 Personal history of peptic ulcer disease: Secondary | ICD-10-CM | POA: Diagnosis not present

## 2018-11-23 DIAGNOSIS — I5022 Chronic systolic (congestive) heart failure: Secondary | ICD-10-CM | POA: Diagnosis not present

## 2018-11-23 DIAGNOSIS — Z1159 Encounter for screening for other viral diseases: Secondary | ICD-10-CM | POA: Insufficient documentation

## 2018-11-23 DIAGNOSIS — Z7901 Long term (current) use of anticoagulants: Secondary | ICD-10-CM | POA: Insufficient documentation

## 2018-11-23 DIAGNOSIS — I447 Left bundle-branch block, unspecified: Secondary | ICD-10-CM | POA: Diagnosis not present

## 2018-11-23 DIAGNOSIS — E785 Hyperlipidemia, unspecified: Secondary | ICD-10-CM | POA: Diagnosis not present

## 2018-11-23 DIAGNOSIS — Z79899 Other long term (current) drug therapy: Secondary | ICD-10-CM | POA: Diagnosis not present

## 2018-11-23 DIAGNOSIS — N183 Chronic kidney disease, stage 3 (moderate): Secondary | ICD-10-CM | POA: Diagnosis not present

## 2018-11-23 DIAGNOSIS — Z9889 Other specified postprocedural states: Secondary | ICD-10-CM | POA: Insufficient documentation

## 2018-11-23 DIAGNOSIS — Z87891 Personal history of nicotine dependence: Secondary | ICD-10-CM | POA: Insufficient documentation

## 2018-11-23 MED ORDER — SPIRONOLACTONE 25 MG PO TABS: 12.5000 mg | ORAL_TABLET | Freq: Every day | ORAL | 3 refills | Status: DC

## 2018-11-23 NOTE — H&P (Signed)
Kathlen Mody Documented: 06/01/2018 2:00 PM Location: [facility] Patient #: [id number] DOB: 1941/04/26 Single / Language: Cleophus Molt / Race: White Male   History of Present Illness   The patient is a 78 year old male who presents with an inguinal hernia. This gentleman presents with a left inguinal hernia. I actually performed a laparoscopic left inguinal hernia repair with mesh on him in 2013. He had a previous open repair of the left inguinal hernia 30 years prior to that. He reports he is doing well until about a month ago when he noticed a recurrent hernia. It is been mildly painful and difficult to reduce at times but he has had no effective symptoms. He is on blood thinning medication for chronic A. fib. He is otherwise been doing well.   Past Surgical History Aneurysm Repair  Cataract Surgery  Bilateral. Foot Surgery  Left. Knee Surgery  Bilateral. Laparoscopic Inguinal Hernia Surgery  Right. multiple Shoulder Surgery  Left. Tonsillectomy   Diagnostic Studies History  Colonoscopy  5-10 years ago  Allergies  No Known Drug Allergies   Medication History Temazepam (15MG  Capsule, Oral) Active. traMADol HCl (50MG  Tablet, Oral) Active. Amiodarone HCl (200MG  Tablet, Oral) Active. Carvedilol (3.125MG  Tablet, Oral) Active. Finasteride (1MG  Tablet, Oral) Active. Losartan Potassium (25MG  Tablet, Oral) Active. Pantoprazole Sodium (40MG  Tablet DR, Oral) Active. Rosuvastatin Calcium (5MG  Tablet, Oral) Active. Torsemide (20MG  Tablet, Oral) Active. Xarelto (15MG  Tablet, Oral) Active. Medications Reconciled  Social History  Alcohol use  Occasional alcohol use. Caffeine use  Carbonated beverages, Coffee. No drug use  Tobacco use  Former smoker.  Family History Alcohol Abuse  Father. Cerebrovascular Accident  Father, Mother. Depression  Father.  Other Problems  Arthritis  Atrial Fibrillation  Gastric Ulcer  High blood  pressure  Inguinal Hernia     Review of Systems  General Not Present- Appetite Loss, Chills, Fatigue, Fever, Night Sweats, Weight Gain and Weight Loss. Skin Not Present- Change in Wart/Mole, Dryness, Hives, Jaundice, New Lesions, Non-Healing Wounds, Rash and Ulcer. HEENT Not Present- Earache, Hearing Loss, Hoarseness, Nose Bleed, Oral Ulcers, Ringing in the Ears, Seasonal Allergies, Sinus Pain, Sore Throat, Visual Disturbances, Wears glasses/contact lenses and Yellow Eyes. Respiratory Not Present- Bloody sputum, Chronic Cough, Difficulty Breathing, Snoring and Wheezing. Breast Not Present- Breast Mass, Breast Pain, Nipple Discharge and Skin Changes. Cardiovascular Not Present- Chest Pain, Difficulty Breathing Lying Down, Leg Cramps, Palpitations, Rapid Heart Rate, Shortness of Breath and Swelling of Extremities. Gastrointestinal Not Present- Abdominal Pain, Bloating, Bloody Stool, Change in Bowel Habits, Chronic diarrhea, Constipation, Difficulty Swallowing, Excessive gas, Gets full quickly at meals, Hemorrhoids, Indigestion, Nausea, Rectal Pain and Vomiting. Male Genitourinary Not Present- Blood in Urine, Change in Urinary Stream, Frequency, Impotence, Nocturia, Painful Urination, Urgency and Urine Leakage. Musculoskeletal Not Present- Back Pain, Joint Pain, Joint Stiffness, Muscle Pain, Muscle Weakness and Swelling of Extremities. Neurological Not Present- Decreased Memory, Fainting, Headaches, Numbness, Seizures, Tingling, Tremor, Trouble walking and Weakness. Endocrine Not Present- Cold Intolerance, Excessive Hunger, Hair Changes, Heat Intolerance, Hot flashes and New Diabetes. Hematology Not Present- Blood Thinners, Easy Bruising, Excessive bleeding, Gland problems, HIV and Persistent Infections.  Vitals  Weight: 168.8 lb Height: 68in Body Surface Area: 1.9 m Body Mass Index: 25.67 kg/m  Pulse: 73 (Regular)  BP: 108/70(Sitting, Left Arm, Standard)    Physical Exam   General Mental Status-Alert. General Appearance-Consistent with stated age. Hydration-Well hydrated. Voice-Normal.  Head and Neck Head-normocephalic, atraumatic with no lesions or palpable masses. Trachea-midline.  Eye Eyeball -  Bilateral-Extraocular movements intact. Sclera/Conjunctiva - Bilateral-No scleral icterus.  Chest and Lung Exam Chest and lung exam reveals -quiet, even and easy respiratory effort with no use of accessory muscles and on auscultation, normal breath sounds, no adventitious sounds and normal vocal resonance. Inspection Chest Wall - Normal. Back - normal.  Cardiovascular Cardiovascular examination reveals -normal heart sounds, regular rate and rhythm with no murmurs and normal pedal pulses bilaterally.  Abdomen Inspection Skin - Scar - no surgical scars. Hernias - Inguinal hernia - Left - Reducible. Note: This is a somewhat difficult to reduce but reducible left inguinal hernia. There is no evidence of right inguinal hernia. Palpation/Percussion Palpation and Percussion of the abdomen reveal - Soft, Non Tender, No Rebound tenderness, No Rigidity (guarding) and No hepatosplenomegaly. Auscultation Auscultation of the abdomen reveals - Bowel sounds normal.  Neurologic - Did not examine.  Musculoskeletal - Did not examine.    Assessment & Plan  RECURRENT LEFT INGUINAL HERNIA (K40.91) Impression: He is fairly symptomatic so an open right inguinal hernia repair with mesh was recommended. I discussed this with him in detail. I would need him to stop his blood thinning medication for approximately 1-2 days preoperatively. We would do this with a tap block by anesthesiology. I discussed the risks of the procedure in detail. We discussed use of mesh. We discussed the risk which includes but is not limited to bleeding, infection, injury to surrounding structures, hernia recurrence, cardiopulmonary she is, etc. He understands and wishes to  proceed with surgery

## 2018-11-23 NOTE — Anesthesia Preprocedure Evaluation (Signed)
Anesthesia Evaluation  Patient identified by MRN, date of birth, ID band Patient awake    Reviewed: Allergy & Precautions, NPO status , Patient's Chart, lab work & pertinent test results, reviewed documented beta blocker date and time   Airway Mallampati: I  TM Distance: >3 FB Neck ROM: Full    Dental  (+) Dental Advisory Given   Pulmonary former smoker,    Pulmonary exam normal        Cardiovascular hypertension, Pt. on medications and Pt. on home beta blockers +CHF  Normal cardiovascular exam+ dysrhythmias Atrial Fibrillation      Neuro/Psych Anxiety    GI/Hepatic PUD, GERD  Medicated and Controlled,  Endo/Other    Renal/GU Renal InsufficiencyRenal disease     Musculoskeletal  (+) Arthritis ,   Abdominal   Peds  Hematology  (+) anemia ,   Anesthesia Other Findings   Reproductive/Obstetrics                             Anesthesia Physical Anesthesia Plan  ASA: III  Anesthesia Plan: General   Post-op Pain Management:    Induction: Intravenous  PONV Risk Score and Plan: 3  Airway Management Planned: LMA  Additional Equipment:   Intra-op Plan:   Post-operative Plan: Extubation in OR  Informed Consent: I have reviewed the patients History and Physical, chart, labs and discussed the procedure including the risks, benefits and alternatives for the proposed anesthesia with the patient or authorized representative who has indicated his/her understanding and acceptance.     Dental advisory given  Plan Discussed with: CRNA  Anesthesia Plan Comments:         Anesthesia Quick Evaluation

## 2018-11-23 NOTE — Patient Instructions (Signed)
START Spironolactone 12.5mg  (1/2 tab) daily  Labs in 10 days We will only contact you if something comes back abnormal or we need to make some changes. Otherwise no news is good news!  Your physician has requested that you have an echocardiogram in 2-3 weeks. Echocardiography is a painless test that uses sound waves to create images of your heart. It provides your doctor with information about the size and shape of your heart and how well your heart's chambers and valves are working. This procedure takes approximately one hour. There are no restrictions for this procedure.   Your physician recommends that you schedule a follow-up appointment in: 6 weeks with Dr Aundra Dubin   At the [facility], you and your health needs are our priority. As part of our continuing mission to provide you with exceptional heart care, we have created designated Provider Care Teams. These Care Teams include your primary Cardiologist (physician) and Advanced Practice Providers (APPs- Physician Assistants and Nurse Practitioners) who all work together to provide you with the care you need, when you need it.   You may see any of the following providers on your designated Care Team at your next follow up: Marland Kitchen Dr Glori Bickers . Dr [name] . Darrick Grinder, NP

## 2018-11-24 ENCOUNTER — Encounter (HOSPITAL_COMMUNITY): Payer: Self-pay

## 2018-11-24 ENCOUNTER — Other Ambulatory Visit: Payer: Self-pay

## 2018-11-24 ENCOUNTER — Encounter (HOSPITAL_COMMUNITY)
Admission: RE | Disposition: A | Discharge status: Home / Self Care | Payer: Self-pay | Source: Home / Self Care | Attending: Surgery

## 2018-11-24 ENCOUNTER — Ambulatory Visit (HOSPITAL_COMMUNITY): Payer: Medicare Other | Admitting: Certified Registered Nurse Anesthetist

## 2018-11-24 ENCOUNTER — Ambulatory Visit (HOSPITAL_COMMUNITY)
Admission: RE | Admit: 2018-11-24 | Discharge: 2018-11-24 | Disposition: A | Discharge status: Home / Self Care | Payer: Medicare Other | Attending: Surgery | Admitting: Surgery

## 2018-11-24 DIAGNOSIS — Z87891 Personal history of nicotine dependence: Secondary | ICD-10-CM | POA: Diagnosis not present

## 2018-11-24 DIAGNOSIS — M1990 Unspecified osteoarthritis, unspecified site: Secondary | ICD-10-CM | POA: Insufficient documentation

## 2018-11-24 DIAGNOSIS — Z79891 Long term (current) use of opiate analgesic: Secondary | ICD-10-CM | POA: Diagnosis not present

## 2018-11-24 DIAGNOSIS — I482 Chronic atrial fibrillation, unspecified: Secondary | ICD-10-CM | POA: Insufficient documentation

## 2018-11-24 DIAGNOSIS — K4091 Unilateral inguinal hernia, without obstruction or gangrene, recurrent: Secondary | ICD-10-CM | POA: Diagnosis not present

## 2018-11-24 DIAGNOSIS — Z79899 Other long term (current) drug therapy: Secondary | ICD-10-CM | POA: Insufficient documentation

## 2018-11-24 DIAGNOSIS — I509 Heart failure, unspecified: Secondary | ICD-10-CM | POA: Insufficient documentation

## 2018-11-24 DIAGNOSIS — Z7901 Long term (current) use of anticoagulants: Secondary | ICD-10-CM | POA: Insufficient documentation

## 2018-11-24 DIAGNOSIS — I110 Hypertensive heart disease with heart failure: Secondary | ICD-10-CM | POA: Diagnosis not present

## 2018-11-24 DIAGNOSIS — G8918 Other acute postprocedural pain: Secondary | ICD-10-CM | POA: Diagnosis not present

## 2018-11-24 HISTORY — PX: INGUINAL HERNIA REPAIR: SHX194

## 2018-11-24 LAB — PROTIME-INR
INR: 1.1 (ref 0.8–1.2)
Prothrombin Time: 14.3 seconds (ref 11.4–15.2)

## 2018-11-24 SURGERY — REPAIR, HERNIA, INGUINAL, ADULT
Anesthesia: General | Laterality: Left

## 2018-11-24 MED ORDER — BUPIVACAINE-EPINEPHRINE 0.5% -1:200000 IJ SOLN
INTRAMUSCULAR | Status: DC | PRN
  Administered 2018-11-24: 20 mL

## 2018-11-24 MED ORDER — LIDOCAINE 2% (20 MG/ML) 5 ML SYRINGE
INTRAMUSCULAR | Status: AC
  Filled 2018-11-24: qty 5

## 2018-11-24 MED ORDER — ONDANSETRON HCL 4 MG/2ML IJ SOLN
INTRAMUSCULAR | Status: DC | PRN
  Administered 2018-11-24: 4 mg via INTRAVENOUS

## 2018-11-24 MED ORDER — PHENYLEPHRINE 40 MCG/ML (10ML) SYRINGE FOR IV PUSH (FOR BLOOD PRESSURE SUPPORT)
PREFILLED_SYRINGE | INTRAVENOUS | Status: DC | PRN
  Administered 2018-11-24 (×3): 80 ug via INTRAVENOUS

## 2018-11-24 MED ORDER — 0.9 % SODIUM CHLORIDE (POUR BTL) OPTIME
TOPICAL | Status: DC | PRN
  Administered 2018-11-24: 1000 mL

## 2018-11-24 MED ORDER — LACTATED RINGERS IV SOLN
INTRAVENOUS | Status: DC | PRN
  Administered 2018-11-24: 08:00:00 via INTRAVENOUS

## 2018-11-24 MED ORDER — CHLORHEXIDINE GLUCONATE CLOTH 2 % EX PADS: 6.0000 | MEDICATED_PAD | Freq: Once | CUTANEOUS | Status: DC

## 2018-11-24 MED ORDER — ONDANSETRON HCL 4 MG/2ML IJ SOLN
INTRAMUSCULAR | Status: AC
  Filled 2018-11-24: qty 2

## 2018-11-24 MED ORDER — CEFAZOLIN SODIUM-DEXTROSE 2-4 GM/100ML-% IV SOLN
2.0000 g | INTRAVENOUS | Status: AC
  Administered 2018-11-24: 09:00:00 2 g via INTRAVENOUS
  Filled 2018-11-24: qty 100

## 2018-11-24 MED ORDER — LIDOCAINE 2% (20 MG/ML) 5 ML SYRINGE
INTRAMUSCULAR | Status: DC | PRN
  Administered 2018-11-24: 60 mg via INTRAVENOUS

## 2018-11-24 MED ORDER — BUPIVACAINE-EPINEPHRINE (PF) 0.5% -1:200000 IJ SOLN
INTRAMUSCULAR | Status: AC
  Filled 2018-11-24: qty 30

## 2018-11-24 MED ORDER — PROPOFOL 10 MG/ML IV BOLUS
INTRAVENOUS | Status: DC | PRN
  Administered 2018-11-24: 150 mg via INTRAVENOUS

## 2018-11-24 MED ORDER — SODIUM CHLORIDE 0.9 % IV SOLN
INTRAVENOUS | Status: DC | PRN
  Administered 2018-11-24: 75 ug/min via INTRAVENOUS
  Administered 2018-11-24: 09:00:00 50 ug/min via INTRAVENOUS

## 2018-11-24 MED ORDER — FENTANYL CITRATE (PF) 250 MCG/5ML IJ SOLN
INTRAMUSCULAR | Status: DC | PRN
  Administered 2018-11-24: 50 ug via INTRAVENOUS

## 2018-11-24 MED ORDER — MIDAZOLAM HCL 2 MG/2ML IJ SOLN
INTRAMUSCULAR | Status: AC
  Filled 2018-11-24: qty 2

## 2018-11-24 MED ORDER — DEXAMETHASONE SODIUM PHOSPHATE 10 MG/ML IJ SOLN
INTRAMUSCULAR | Status: AC
  Filled 2018-11-24: qty 1

## 2018-11-24 MED ORDER — PROPOFOL 10 MG/ML IV BOLUS
INTRAVENOUS | Status: AC
  Filled 2018-11-24: qty 40

## 2018-11-24 MED ORDER — PHENYLEPHRINE 40 MCG/ML (10ML) SYRINGE FOR IV PUSH (FOR BLOOD PRESSURE SUPPORT)
PREFILLED_SYRINGE | INTRAVENOUS | Status: AC
  Filled 2018-11-24: qty 10

## 2018-11-24 MED ORDER — FENTANYL CITRATE (PF) 250 MCG/5ML IJ SOLN
INTRAMUSCULAR | Status: AC
  Filled 2018-11-24: qty 5

## 2018-11-24 MED ORDER — MIDAZOLAM HCL 5 MG/5ML IJ SOLN
INTRAMUSCULAR | Status: DC | PRN
  Administered 2018-11-24: 2 mg via INTRAVENOUS

## 2018-11-24 MED ORDER — DEXAMETHASONE SODIUM PHOSPHATE 10 MG/ML IJ SOLN
INTRAMUSCULAR | Status: DC | PRN
  Administered 2018-11-24: 5 mg via INTRAVENOUS

## 2018-11-24 SURGICAL SUPPLY — 40 items
ADH SKN CLS APL DERMABOND .7 (GAUZE/BANDAGES/DRESSINGS) ×1
ADH SKN CLS LQ APL DERMABOND (GAUZE/BANDAGES/DRESSINGS) ×1
APL PRP STRL LF DISP 70% ISPRP (MISCELLANEOUS) ×1
BLADE CLIPPER SURG (BLADE) ×2 IMPLANT
CHLORAPREP W/TINT 26 (MISCELLANEOUS) ×3 IMPLANT
COVER SURGICAL LIGHT HANDLE (MISCELLANEOUS) ×3 IMPLANT
COVER WAND RF STERILE (DRAPES) IMPLANT
DERMABOND ADHESIVE PROPEN (GAUZE/BANDAGES/DRESSINGS) ×2
DERMABOND ADVANCED (GAUZE/BANDAGES/DRESSINGS) ×2
DERMABOND ADVANCED .7 DNX12 (GAUZE/BANDAGES/DRESSINGS) ×1 IMPLANT
DERMABOND ADVANCED .7 DNX6 (GAUZE/BANDAGES/DRESSINGS) IMPLANT
DRAIN PENROSE 1/2X12 LTX STRL (WOUND CARE) ×2 IMPLANT
DRAPE LAPAROTOMY TRNSV 102X78 (DRAPES) ×3 IMPLANT
ELECT REM PT RETURN 9FT ADLT (ELECTROSURGICAL) ×3
ELECTRODE REM PT RTRN 9FT ADLT (ELECTROSURGICAL) ×1 IMPLANT
GLOVE SURG SIGNA 7.5 PF LTX (GLOVE) ×3 IMPLANT
GOWN STRL REUS W/ TWL LRG LVL3 (GOWN DISPOSABLE) ×1 IMPLANT
GOWN STRL REUS W/ TWL XL LVL3 (GOWN DISPOSABLE) ×1 IMPLANT
GOWN STRL REUS W/TWL LRG LVL3 (GOWN DISPOSABLE) ×3
GOWN STRL REUS W/TWL XL LVL3 (GOWN DISPOSABLE) ×3
KIT BASIN OR (CUSTOM PROCEDURE TRAY) ×3 IMPLANT
KIT TURNOVER KIT B (KITS) ×3 IMPLANT
MESH PARIETEX PROGRIP LEFT (Mesh General) ×2 IMPLANT
NDL HYPO 25GX1X1/2 BEV (NEEDLE) ×1 IMPLANT
NEEDLE HYPO 25GX1X1/2 BEV (NEEDLE) ×3 IMPLANT
NS IRRIG 1000ML POUR BTL (IV SOLUTION) ×3 IMPLANT
PACK GENERAL/GYN (CUSTOM PROCEDURE TRAY) ×3 IMPLANT
PAD ARMBOARD 7.5X6 YLW CONV (MISCELLANEOUS) ×3 IMPLANT
PENCIL SMOKE EVACUATOR (MISCELLANEOUS) ×3 IMPLANT
SUT MON AB 4-0 PC3 18 (SUTURE) ×3 IMPLANT
SUT SILK 2 0 SH (SUTURE) ×2 IMPLANT
SUT VIC AB 2-0 CT1 27 (SUTURE) ×3
SUT VIC AB 2-0 CT1 TAPERPNT 27 (SUTURE) ×1 IMPLANT
SUT VIC AB 2-0 SH 27 (SUTURE) ×3
SUT VIC AB 2-0 SH 27XBRD (SUTURE) IMPLANT
SUT VIC AB 3-0 CT1 27 (SUTURE) ×3
SUT VIC AB 3-0 CT1 TAPERPNT 27 (SUTURE) ×1 IMPLANT
SYR CONTROL 10ML LL (SYRINGE) ×3 IMPLANT
TOWEL GREEN STERILE (TOWEL DISPOSABLE) ×3 IMPLANT
TOWEL GREEN STERILE FF (TOWEL DISPOSABLE) ×3 IMPLANT

## 2018-11-24 NOTE — Progress Notes (Signed)
Date:  11/24/2018   ID:  Travis [name], DOB May 29, 1940, MRN [id number]   Provider location: [facility] Type of Visit: Established patient   PCP:  [name], MD  Cardiologist: [name] EP: [name]  Chief Complaint: Fatigue   History of Present Illness: Travis [name] is a 78 y.o. 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   Pt failed DCCV 08/14/17, 08/31/17, and 09/17/17. Failed Tikosyn. Did have conversion with Amiodarone and DCCV. Pt underwent successful ablation 10/07/17.  However, he had ERAF.  Admitted 5/18 - 10/14/17 with atrial fibrillation/RVR and acute on chronic systolic CHF.  Echo showed EF 30-35%. Diuresed and reloaded with Amio. Underwent successful DCCV 10/13/17.    Echo in 9/19 showed improvement in EF to 50% in NSR.   In 9/19, patient reported profound fatigue.  He was volume overloaded on exam and diuretic was increased.  CBC was done, hgb was low and patient ended up going to the ER for admission.  He was diagnosed with UGIB likely from duodenal ulcer, probably due to meloxicam.  This was stopped.   Again in 10/19, he was profoundly fatigued.  CBC showed hgb 6.7, he was sent to the ER where he had 2 unit transfusion and EGD was done again, showing duodenal stenosis.  I also stopped his atorvastatin.   He went back into atrial fibrillation and had successful TEE-DCCV in 2/20.  TEE showed EF 35-40% with mildly decreased RV systolic function, EF back down in setting of atrial fibrillation.  In 3/20, he was back in atrial fibrillation and has been in atrial fibrillation since then.  He saw [name] and it was decided not to pursue redo ablation   Patient has hernia surgery tomorrow.  Generally, his atrial fibrillation rate by home BP  cuff is in the 80s.  Today, HR was 123 just after walking into the office and was 88 at rest. He has occasional very short lightheaded spells, no trigger.  No syncope or presyncope with these events (mild lightheadedness).  No dyspnea walking on flat ground.  No chest pain.  No orthopnea.   ECG (personally reviewed): atrial fibrillation, IVCD 156 msec, rate 88   Labs (3/19): LDL 65 Labs (8/19): K 5.2 => 4.1, creatinine 1.6 Labs (9/19): K 3.9, creatinine 1.78, hgb 9.6, TSH normal.  Labs (10/19): hgb 6.7 => 11.8, LFTs normal, K 3.6, creatinine 1.3 Labs (11/19): LFTs, TSH normal Labs (1/20): K 4, creatinine 1.15, hgb 14.5 Labs (2/20): Hgb 13.4 Labs (4/20): LFTs normal, TSH elevated Labs (6/20): LDL 38, HDL 64, K 4.2, creatinine 1.45, hgb 14.7  Past Medical History 1. Chronic systolic CHF: Possible tachy-mediated cardiomyopathy.    - Echo(08/13/2017): LVEF 30-35%, moderate AS, mild AI, severe LAE, RV mild dilated with systolic function mild/mod reduced, PA peak pressure 46 mm Hg. - Echo (9/19): EF 50%, mild diffuse hypokinesis, normal RV size and systolic function, PASP 70 mmHg, moderate AS, moderate AI, moderate MR.  - TEE (2/20): EF 35-40%, mild-moderate LV dilation, normal RV size with mildly decreased systolic function, mil MR, trileaflet aortic valve with mild AS (mean gradient 10 mmHg, AVA 1.7 cm^2), moderate AI.  2. Atrial fibrillation: Has been difficult to control.  Failed Tikosyn and multiple DCCVs.  Failed amiodarone prior to ablation.  Atrial fibrillation ablation but then had ERAF.  He was cardioverted again in 5/19 with return to NSR.  DCCV again in 2/20 but back in atrial fibrillation in 3/20.  3. Aortic valve disorder: TEE 2/20 with mild AS, moderate AI (valve is not bicuspid).   4. HTN 5. Hyperlipidemia: Severe fatigue with atorvastatin, myalgias with Crestor.  6. Mitral regurgitation: Mild on last echo.  7. CAD: coronary calcium noted on chest CT.  8. CKD: Stage 3.  9.  Carotid stenosis: Carotid dopplers (4/27) with 06-23% LICA stenosis.  10. Chronic LBBB 11. PUD: UGIB in 9/19 with duodenal ulcer probably due to meloxicam use.  - EGD (10/19): Duodenal stenosis.  12. Inguinal hernia  Current Outpatient Medications  Medication Sig Dispense Refill  . amiodarone (PACERONE) 200 MG tablet Take 200 mg by mouth daily.    . Carboxymethylcell-Hypromellose (GENTEAL) 0.25-0.3 % GEL Place 1 drop into both eyes daily as needed (dry eyes).     . carvedilol (COREG) 25 MG tablet Take 1 tablet (25 mg total) by mouth 2 (two) times daily. 180 tablet 3  . Evolocumab (REPATHA SURECLICK) 762 MG/ML SOAJ Inject 1 pen into the skin every 14 (fourteen) days. (Patient taking differently: Inject 1 pen into the skin every 14 (fourteen) days. Wednesdays.) 2 pen 11  . finasteride (PROPECIA) 1 MG tablet TAKE 1 TABLET(1 MG) BY MOUTH DAILY (Patient taking differently: Take 1 mg by mouth daily. ) 90 tablet 1  . losartan (COZAAR) 25 MG tablet Take 1 tablet (25 mg total) by mouth daily. 90 tablet 3  . pantoprazole (PROTONIX) 40 MG tablet TAKE 1 TABLET(40 MG) BY MOUTH TWICE DAILY (Patient taking differently: Take 40 mg by mouth 2 (two) times a day. ) 60 tablet 2  . temazepam (RESTORIL) 15 MG capsule TAKE ONE CAPSULE BY MOUTH EVERY NIGHT AT BEDTIME AS NEEDED (Patient taking differently: Take 15 mg by mouth at bedtime as needed for sleep. ) 30 capsule 5  . torsemide (DEMADEX) 20 MG tablet TAKE 2 TABLETS BY MOUTH EVERY MORNING AND 1 TABLET AT NIGHT (Patient taking differently: Take 20 mg by mouth See admin instructions. TAKE 2 TABLETS BY MOUTH EVERY MORNING AND 1 TABLET AT NIGHT) 270 tablet 0  . traMADol (ULTRAM) 50 MG tablet TAKE 1 TABLET BY MOUTH TID PRN FOR MODERATE OR SEVERE PAIN(ARTHIRITIS- HANDS AND NECK) (Patient taking differently: Take 50 mg by mouth 3 (three) times daily. (0600,1200,1800)(ARTHIRITIS- HANDS AND NECK)) 90 tablet 5  . XARELTO 15 MG TABS tablet TAKE 1 TABLET(15 MG) BY MOUTH DAILY  WITH SUPPER (Patient taking differently: Take 15 mg by mouth every evening. ) 60 tablet 6  . Multiple Vitamins-Minerals (PRESERVISION AREDS 2 PO) Take 1 tablet by mouth 2 (two) times a day.    . spironolactone (ALDACTONE) 25 MG tablet Take 0.5 tablets (12.5 mg total) by mouth daily. 45 tablet 3   No current facility-administered medications for this encounter.     Allergies:   Patient has no known allergies.   Social History:  The patient  reports that he quit smoking about 32 years ago. He has a 37.50 pack-year smoking history. He has never used smokeless tobacco. He reports previous alcohol use of about 1.0 standard drinks of alcohol per week. He reports that he does not use drugs.   Family History:  The patient's family history includes Lung cancer (age of onset: 43) in his mother; Stroke in his brother and mother; Stroke (age of onset: 30) in his father.  ROS:  Please see the history of present illness.   All other systems are personally reviewed and negative.   Exam:   BP 118/81   Pulse (!) 123   Wt 74.4 kg (164 lb)   SpO2 98%   BMI 24.94 kg/m  General: NAD Neck: JVP 8 cm, no thyromegaly or thyroid nodule.  Lungs: Clear to auscultation bilaterally with normal respiratory effort. CV: Nondisplaced PMI.  Heart irregular S1/S2, no S3/S4, no murmur.  No peripheral edema.  No carotid bruit.  Normal pedal pulses.  Abdomen: Soft, nontender, no hepatosplenomegaly, no distention.  Skin: Intact without lesions or rashes.  Neurologic: Alert and oriented x 3.  Psych: Normal affect. Extremities: No clubbing or cyanosis.  HEENT: Normal.    Recent Labs: 09/14/2018: TSH 4.886 11/08/2018: ALT 14 11/22/2018: BUN 26; Creatinine, Ser 1.45; Hemoglobin 14.7; Platelets 180; Potassium 4.2; Sodium 136  Personally reviewed   Wt Readings from Last 3 Encounters:  11/23/18 74.4 kg (164 lb)  11/22/18 73.9 kg (163 lb)  10/11/18 71.7 kg (158 lb)      ASSESSMENT AND PLAN:  1. Chronic systolic CHF:  Echo 3/47 with EF 30-35%, mild to moderately decreased RV systolic function in setting of atrial fibrillation with RVR.  Echo in 9/19 with patient in NSR showed EF up to 50%, mild diffuse hypokinesis.  Back in atrial fibrillation in 2/20, TEE showed EF 35-40%.  Suspect he has atrial fibrillation/tachycardia-mediated cardiomyopathy given fall in EF with recurrent atrial fibrillation.  NYHA class II symptoms.  He does not appear volume overloaded on exam.   - Continue torsemide 40 qam/20 qpm. - Continue losartan 25 mg daily.  - Continue Coreg 25 mg bid.     - Start spironolactone 12.5 mg daily.  -[name] has decided not to repeat atrial fibrillation ablation.  We have been considering AV nodal ablation with BiV pacing.  As heart rate appeared controlled, [name] decided to hold off on this at last appointment.  However, HR is higher today.  Additionally, he has wide LBBB-like IVCD.  I will arrange for a repeat echo in a couple weeks.  If EF remains low, I would favor BiV pacing, which will likely also require AV nodal ablation.  2. Atrial fibrillation: Paroxysmal.  Difficult to control rate and rhythm. He failed Tikosyn. He started on amiodarone, then had atrial fibrillation ablation in 5/19 with ERAF, requiring DCCV.  Most recently cardioverted in 2/20 but back in atrial fibrillation by 3/20 and has stayed in atrial fibrillation.  He saw [name] and they decided against redo ablation. HR had been in 70s-80s generally but higher today.  - Continue amiodarone for now, but will probably stop at next appt if no further attempts made for rhythm control.  Check LFTs and TSH, arrange for regular eye exam.    - If EF is low on repeat echo in a couple of weeks, would favor CRT with AV nodal ablation. .  - Continue Xarelto 15 mg daily.  3. CAD: Extensive coronary calcification on last CT chest. No chest pain. I suspect that his cardiomyopathy is tachycardia-mediated/atrial fibrillation-related given rise  in EF in NSR and then fall when back in atrial fibrillation rather than ischemic cardiomyopathy.  - No ASA with Xarelto use.  - Unable to tolerate statins.  He will continue Repatha, good LDL on last lipids check.   4. CKD: Stage 3.  5. Aortic valve disease: TEE in 2/20 showed that aortic valve was not bicuspid but did  have mild stenosis and moderate AI.  6. Mitral regurgitation: mild MR 2/20 TEE.   7. LBBB: Chronic.  8. Hyperlipidemia: Unable to tolerate statins.  Now on Repatha.  Good lipids in 5/20.  9. Hernia: Needs surgery.  I think he is of reasonable risk to undergo surgery.  He should take Coreg and amiodarone the morning of surgery, will need to hold Xarelto pre-op. 10. Carotid stenosis: We will need to arrange repeat carotid dopplers.   Recommended follow-up:  6 wks  Signed, [name], MD  11/24/2018  [facility] [address] [facility] [place] [address] [phone] (office) [phone] (fax)

## 2018-11-24 NOTE — Interval H&P Note (Signed)
History and Physical Interval Note:no change in H and P  11/24/2018 7:42 AM  Travis [name]  has presented today for surgery, with the diagnosis of RECURRENT LEFT INGUINAL HERNIA.  The various methods of treatment have been discussed with the patient and family. After consideration of risks, benefits and other options for treatment, the patient has consented to  Procedure(s) with comments: OPEN LEFT INGUINAL HERNIA REPAIR WITH MESH (Left) - TAP BLOCK as a surgical intervention.  The patient's history has been reviewed, patient examined, no change in status, stable for surgery.  I have reviewed the patient's chart and labs.  Questions were answered to the patient's satisfaction.     Coralie Keens

## 2018-11-24 NOTE — Discharge Instructions (Signed)
[facility] _______[facility]  UMBILICAL OR INGUINAL HERNIA REPAIR: POST OP INSTRUCTIONS  Always review your discharge instruction sheet given to you by the facility where your surgery was performed. IF YOU HAVE DISABILITY OR FAMILY LEAVE FORMS, YOU MUST BRING THEM TO THE OFFICE FOR PROCESSING.   DO NOT GIVE THEM TO YOUR DOCTOR.  1. A  prescription for pain medication may be given to you upon discharge.  Take your pain medication as prescribed, if needed.  If narcotic pain medicine is not needed, then you may take acetaminophen (Tylenol) or ibuprofen (Advil) as needed. 2. Take your usually prescribed medications unless otherwise directed. If you need a refill on your pain medication, please contact your pharmacy.  They will contact our office to request authorization. Prescriptions will not be filled after 5 pm or on week-ends. 3. You should follow a light diet the first 24 hours after arrival home, such as soup and crackers, etc.  Be sure to include lots of fluids daily.  Resume your normal diet the day after surgery. 4.Most patients will experience some swelling and bruising around the umbilicus or in the groin and scrotum.  Ice packs and reclining will help.  Swelling and bruising can take several days to resolve.  6. It is common to experience some constipation if taking pain medication after surgery.  Increasing fluid intake and taking a stool softener (such as Colace) will usually help or prevent this problem from occurring.  A mild laxative (Milk of Magnesia or Miralax) should be taken according to package directions if there are no bowel movements after 48 hours. 7. Unless discharge instructions indicate otherwise, you may remove your bandages 24-48 hours after surgery, and you may shower at that time.  You may have steri-strips (small skin tapes) in place directly over the incision.  These strips should be left on the skin for 7-10 days.  If your surgeon used skin glue on the  incision, you may shower in 24 hours.  The glue will flake off over the next 2-3 weeks.  Any sutures or staples will be removed at the office during your follow-up visit. 8. ACTIVITIES:  You may resume regular (light) daily activities beginning the next day--such as daily self-care, walking, climbing stairs--gradually increasing activities as tolerated.  You may have sexual intercourse when it is comfortable.  Refrain from any heavy lifting or straining until approved by your doctor.  a.You may drive when you are no longer taking prescription pain medication, you can comfortably wear a seatbelt, and you can safely maneuver your car and apply brakes. b.RETURN TO WORK:   _____________________________________________  9.You should see your doctor in the office for a follow-up appointment approximately 2-3 weeks after your surgery.  Make sure that you call for this appointment within a day or two after you arrive home to insure a convenient appointment time. 10.OTHER INSTRUCTIONS: __NO LIFTING MORE THAN 15 TO 20 POUNDS FOR 4 WEEKS ICE PACK, TYLENOL, IBUPROFEN ALSO FOR PAIN OK TO SHOWER STARTING TOMORROW_______________________    _________________________________  DO NOT START TAKING YOUR BLOOD THINNING MEDICINE UNTIL TOMORROW EVENING ____  WHEN TO CALL YOUR DOCTOR: 1. Fever over 101.0 2. Inability to urinate 3. Nausea and/or vomiting 4. Extreme swelling or bruising 5. Continued bleeding from incision. 6. Increased pain, redness, or drainage from the incision  The clinic staff is available to answer your questions during regular business hours.  Please dont hesitate to call and ask to speak to one of the nurses  for clinical concerns.  If you have a medical emergency, go to the nearest emergency room or call 911.  A surgeon from [facility] is always on call at the hospital   [address] [place], [place], [place]  [address] ?  [address], [place], [place] [phone] ? [phone] ? FAX [phone] Web site: [email or web address]

## 2018-11-24 NOTE — Anesthesia Procedure Notes (Signed)
Anesthesia Regional Block: TAP block   Pre-Anesthetic Checklist: ,, timeout performed, Correct Patient, Correct Site, Correct Laterality, Correct Procedure, Correct Position, site marked, Risks and benefits discussed,  Surgical consent,  Pre-op evaluation,  At surgeon's request and post-op pain management  Laterality: Left  Prep: chloraprep       Needles:  Injection technique: Single-shot  Needle Type: Stimiplex     Needle Length: 9cm  Needle Gauge: 21     Additional Needles:   Procedures:,,,, ultrasound used (permanent image in chart),,,,  Narrative:  Start time: 11/24/2018 8:10 AM End time: 11/24/2018 8:18 AM Injection made incrementally with aspirations every 5 mL.  Performed by: Personally  Anesthesiologist: Nolon Nations, MD  Additional Notes: BP cuff, EKG monitors applied. Sedation begun. Artery and nerve location verified with U/S and anesthetic injected incrementally, slowly, and after negative aspirations under direct u/s guidance. Good fascial /perineural spread. Tolerated well.

## 2018-11-24 NOTE — Transfer of Care (Signed)
Immediate Anesthesia Transfer of Care Note  Patient: Travis [name]  Procedure(s) Performed: OPEN LEFT INGUINAL HERNIA REPAIR WITH MESH (Left )  Patient Location: PACU  Anesthesia Type:GA combined with regional for post-op pain  Level of Consciousness: sedated  Airway & Oxygen Therapy: Patient Spontanous Breathing and Patient connected to face mask oxygen  Post-op Assessment: Report given to RN and Post -op Vital signs reviewed and stable  Post vital signs: Reviewed and stable  Last Vitals:  Vitals Value Taken Time  BP 93/73 11/24/18 0925  Temp 36.3 C 11/24/18 0925  Pulse 70 11/24/18 0929  Resp 13 11/24/18 0929  SpO2 98 % 11/24/18 0929  Vitals shown include unvalidated device data.  Last Pain:  Vitals:   11/24/18 0925  TempSrc:   PainSc: Asleep         Complications: No apparent anesthesia complications

## 2018-11-24 NOTE — Op Note (Signed)
OPEN LEFT INGUINAL HERNIA REPAIR WITH MESH  Procedure Note  Travis [name] 11/24/2018   Pre-op Diagnosis: RECURRENT LEFT INGUINAL HERNIA     Post-op Diagnosis: same  Procedure(s): OPEN LEFT INGUINAL HERNIA REPAIR WITH MESH  Surgeon(s): Coralie Keens, MD  Anesthesia: General  Staff:  Circulator: Illene Labrador, RN Scrub Person: Dollene Cleveland T Circulator Assistant: Marliss Czar, RN; Small, Benjaman Lobe, RN RN First Assistant: Cyd Silence, RN  Estimated Blood Loss: Minimal               Findings: Patient was found to have a indirect left inguinal hernia.  Procedure: The patient was brought to the operating room and identifies the correct patient.  He is placed upon the operating room table and general anesthesia was induced.  He had already received a tap block anesthesia in the preoperative holding area.  His lower abdomen was then prepped and draped in usual sterile fashion.  I anesthetized the skin of the previous scar with Marcaine.  I then made an incision with a scalpel.  I dissected down through Scarpa's fascia with electrocautery.  The patient's external beak fascia completely pulled away from his previous open repair that had been performed and was 40 years ago.  There was an indirect inguinal hernia.  I had opened up the fascia further laterally over the internal ring.  I was then able to separate the cord from the overlying fascia and then was able to separate the hernia sac and reduce all its contents from the testicular cord.  I then tied off the base of the sac with a silk suture and reinforced internal ring with a 2-0 silk suture as well.  Next a piece of pro-grip Prolene mesh from [employer] was brought to the field.  I placed against the pubic tubercle and brought around the cord structures re-creating the inguinal floor.  I then sutured in place with several interrupted 2-0 Vicryl sutures.  I then was able to close the external bleed partially with a running 2-0  Vicryl suture as well.  We anesthetized the fascia further with Marcaine.  We then closed office fascia with interrupted 3-0 Vicryl sutures and closed the skin with a running 4-0 Monocryl.  Dermabond was then applied.  The patient tolerated procedure well.  All the counts were correct at the end the procedure.  The patient was then extubated in the operating room and taken in stable addition to the recovery room.          Coralie Keens   Date: 11/24/2018  Time: 9:18 AM

## 2018-11-24 NOTE — Anesthesia Procedure Notes (Signed)
Procedure Name: LMA Insertion Date/Time: 11/24/2018 8:37 AM Performed by: Valda Favia, CRNA Pre-anesthesia Checklist: Patient identified, Emergency Drugs available, Suction available, Patient being monitored and Timeout performed Patient Re-evaluated:Patient Re-evaluated prior to induction Oxygen Delivery Method: Circle system utilized Preoxygenation: Pre-oxygenation with 100% oxygen Induction Type: IV induction LMA: LMA inserted LMA Size: 5.0 Number of attempts: 1 Placement Confirmation: positive ETCO2 and breath sounds checked- equal and bilateral Tube secured with: Tape Dental Injury: Teeth and Oropharynx as per pre-operative assessment

## 2018-11-24 NOTE — Anesthesia Postprocedure Evaluation (Signed)
Anesthesia Post Note  Patient: Travis [name]  Procedure(s) Performed: OPEN LEFT INGUINAL HERNIA REPAIR WITH MESH (Left )     Patient location during evaluation: PACU Anesthesia Type: General Level of consciousness: sedated and patient cooperative Pain management: pain level controlled Vital Signs Assessment: post-procedure vital signs reviewed and stable Respiratory status: spontaneous breathing Cardiovascular status: stable Anesthetic complications: no    Last Vitals:  Vitals:   11/24/18 0945 11/24/18 1005  BP: 105/75 104/65  Pulse: 70 77  Resp: 18 18  Temp: (!) 36.3 C   SpO2: 96% 94%    Last Pain:  Vitals:   11/24/18 0940  TempSrc:   PainSc: 0-No pain                 Nolon Nations

## 2018-11-25 ENCOUNTER — Other Ambulatory Visit (HOSPITAL_COMMUNITY): Payer: Self-pay | Admitting: *Deleted

## 2018-11-25 ENCOUNTER — Encounter (HOSPITAL_COMMUNITY): Payer: Self-pay | Admitting: Surgery

## 2018-11-25 MED ORDER — TORSEMIDE 20 MG PO TABS: ORAL_TABLET | ORAL | 3 refills | Status: DC

## 2018-12-02 ENCOUNTER — Encounter (HOSPITAL_COMMUNITY): Payer: Self-pay | Admitting: Surgery

## 2018-12-06 ENCOUNTER — Other Ambulatory Visit: Payer: Self-pay

## 2018-12-06 ENCOUNTER — Ambulatory Visit (HOSPITAL_COMMUNITY)
Admission: RE | Admit: 2018-12-06 | Discharge: 2018-12-06 | Disposition: A | Discharge status: Home / Self Care | Payer: Medicare Other | Source: Ambulatory Visit | Attending: Internal Medicine | Admitting: Internal Medicine

## 2018-12-06 DIAGNOSIS — I5022 Chronic systolic (congestive) heart failure: Secondary | ICD-10-CM | POA: Diagnosis not present

## 2018-12-06 LAB — COMPREHENSIVE METABOLIC PANEL
ALT: 15 U/L (ref 0–44)
AST: 22 U/L (ref 15–41)
Albumin: 3.6 g/dL (ref 3.5–5.0)
Alkaline Phosphatase: 100 U/L (ref 38–126)
Anion gap: 10 (ref 5–15)
BUN: 24 mg/dL — ABNORMAL HIGH (ref 8–23)
CO2: 25 mmol/L (ref 22–32)
Calcium: 8.8 mg/dL — ABNORMAL LOW (ref 8.9–10.3)
Chloride: 97 mmol/L — ABNORMAL LOW (ref 98–111)
Creatinine, Ser: 1.57 mg/dL — ABNORMAL HIGH (ref 0.61–1.24)
GFR calc Af Amer: 48 mL/min — ABNORMAL LOW (ref >60)
GFR calc non Af Amer: 42 mL/min — ABNORMAL LOW (ref >60)
Glucose, Bld: 183 mg/dL — ABNORMAL HIGH (ref 70–99)
Potassium: 4.7 mmol/L (ref 3.5–5.1)
Sodium: 132 mmol/L — ABNORMAL LOW (ref 135–145)
Total Bilirubin: 0.8 mg/dL (ref 0.3–1.2)
Total Protein: 7 g/dL (ref 6.5–8.1)

## 2018-12-06 LAB — CBC
HCT: 42.6 % (ref 39.0–52.0)
Hemoglobin: 14.2 g/dL (ref 13.0–17.0)
MCH: 34.5 pg — ABNORMAL HIGH (ref 26.0–34.0)
MCHC: 33.3 g/dL (ref 30.0–36.0)
MCV: 103.6 fL — ABNORMAL HIGH (ref 80.0–100.0)
Platelets: 270 10*3/uL (ref 150–400)
RBC: 4.11 MIL/uL — ABNORMAL LOW (ref 4.22–5.81)
RDW: 14.8 % (ref 11.5–15.5)
WBC: 8 10*3/uL (ref 4.0–10.5)
nRBC: 0 % (ref 0.0–0.2)

## 2018-12-06 LAB — TSH: TSH: 5.029 u[IU]/mL — ABNORMAL HIGH (ref 0.350–4.500)

## 2018-12-07 ENCOUNTER — Telehealth (HOSPITAL_COMMUNITY): Payer: Self-pay

## 2018-12-07 DIAGNOSIS — I5022 Chronic systolic (congestive) heart failure: Secondary | ICD-10-CM

## 2018-12-07 MED ORDER — TORSEMIDE 20 MG PO TABS: 40.0000 mg | ORAL_TABLET | Freq: Every day | ORAL | 3 refills | Status: DC

## 2018-12-07 NOTE — Telephone Encounter (Signed)
-----   Message from Larey Dresser, MD sent at 12/06/2018 11:58 PM EDT ----- Creatinine higher, would have him decrease torsemide to 40 mg daily.  BMET again in 2 wks.  TSH elevated, would have him get repeat TSH and free T3 and free T4.

## 2018-12-07 NOTE — Telephone Encounter (Signed)
Spoke with patient, advised of lab results. Pt advised to decrease torsemide to 40mg  daily.  Pt will RTC in 2 weeks for bmet, tsh, free t3 and t4. Verbalized understanding.

## 2018-12-14 ENCOUNTER — Other Ambulatory Visit: Payer: Self-pay

## 2018-12-14 ENCOUNTER — Other Ambulatory Visit (HOSPITAL_COMMUNITY): Payer: Medicare Other

## 2018-12-14 ENCOUNTER — Ambulatory Visit (HOSPITAL_COMMUNITY)
Admission: RE | Admit: 2018-12-14 | Discharge: 2018-12-14 | Disposition: A | Discharge status: Home / Self Care | Payer: Medicare Other | Source: Ambulatory Visit | Attending: Family Medicine | Admitting: Family Medicine

## 2018-12-14 DIAGNOSIS — I447 Left bundle-branch block, unspecified: Secondary | ICD-10-CM | POA: Diagnosis not present

## 2018-12-14 DIAGNOSIS — R001 Bradycardia, unspecified: Secondary | ICD-10-CM | POA: Insufficient documentation

## 2018-12-14 DIAGNOSIS — E785 Hyperlipidemia, unspecified: Secondary | ICD-10-CM | POA: Insufficient documentation

## 2018-12-14 DIAGNOSIS — I350 Nonrheumatic aortic (valve) stenosis: Secondary | ICD-10-CM | POA: Diagnosis not present

## 2018-12-14 DIAGNOSIS — I429 Cardiomyopathy, unspecified: Secondary | ICD-10-CM | POA: Insufficient documentation

## 2018-12-14 DIAGNOSIS — I272 Pulmonary hypertension, unspecified: Secondary | ICD-10-CM | POA: Insufficient documentation

## 2018-12-14 DIAGNOSIS — I5022 Chronic systolic (congestive) heart failure: Secondary | ICD-10-CM | POA: Diagnosis not present

## 2018-12-14 DIAGNOSIS — I110 Hypertensive heart disease with heart failure: Secondary | ICD-10-CM | POA: Insufficient documentation

## 2018-12-15 ENCOUNTER — Other Ambulatory Visit (HOSPITAL_COMMUNITY): Payer: Self-pay | Admitting: Cardiology

## 2018-12-15 ENCOUNTER — Ambulatory Visit (HOSPITAL_COMMUNITY)
Admission: RE | Admit: 2018-12-15 | Discharge: 2018-12-15 | Disposition: A | Discharge status: Home / Self Care | Payer: Medicare Other | Source: Ambulatory Visit | Attending: Cardiovascular Disease | Admitting: Cardiovascular Disease

## 2018-12-15 DIAGNOSIS — I5022 Chronic systolic (congestive) heart failure: Secondary | ICD-10-CM

## 2018-12-15 DIAGNOSIS — I6523 Occlusion and stenosis of bilateral carotid arteries: Secondary | ICD-10-CM

## 2018-12-16 ENCOUNTER — Telehealth (HOSPITAL_COMMUNITY): Payer: Self-pay

## 2018-12-16 DIAGNOSIS — I6523 Occlusion and stenosis of bilateral carotid arteries: Secondary | ICD-10-CM

## 2018-12-16 NOTE — Telephone Encounter (Signed)
-----   Message from Larey Dresser, MD sent at 12/14/2018  8:32 PM EDT ----- EF is lower than before, 20-25%.  I think that we need to consider AV nodal ablation with BiV pacing in this situation, will need him to have appointment with [name] given worsening of EF and LBBB-like IVCD as well as atrial fibrillation.  He needs followup with me soon (next 2-3 weeks) to discuss.

## 2018-12-16 NOTE — Telephone Encounter (Signed)
-----   Message from Larey Dresser, MD sent at 12/15/2018  8:51 PM EDT ----- 11-88% LICA stenosis, repeat study in 1 year.

## 2018-12-16 NOTE — Telephone Encounter (Signed)
Pt aware of results of echo. Pt has an appt with Dr Rayann Heman 8/26.  Routed to MD to see if appt needs to be moved up. Message sent to scheduler to bring patient back in 2-3 weeks. Order placed for Carotid doppler repeat in 1 year

## 2018-12-21 ENCOUNTER — Other Ambulatory Visit: Payer: Self-pay

## 2018-12-21 ENCOUNTER — Ambulatory Visit (HOSPITAL_COMMUNITY)
Admission: RE | Admit: 2018-12-21 | Discharge: 2018-12-21 | Disposition: A | Discharge status: Home / Self Care | Payer: Medicare Other | Source: Ambulatory Visit | Attending: Cardiology | Admitting: Cardiology

## 2018-12-21 DIAGNOSIS — I5022 Chronic systolic (congestive) heart failure: Secondary | ICD-10-CM | POA: Diagnosis not present

## 2018-12-21 LAB — BASIC METABOLIC PANEL
Anion gap: 10 (ref 5–15)
BUN: 26 mg/dL — ABNORMAL HIGH (ref 8–23)
CO2: 23 mmol/L (ref 22–32)
Calcium: 8.7 mg/dL — ABNORMAL LOW (ref 8.9–10.3)
Chloride: 100 mmol/L (ref 98–111)
Creatinine, Ser: 1.43 mg/dL — ABNORMAL HIGH (ref 0.61–1.24)
GFR calc Af Amer: 54 mL/min — ABNORMAL LOW (ref >60)
GFR calc non Af Amer: 47 mL/min — ABNORMAL LOW (ref >60)
Glucose, Bld: 83 mg/dL (ref 70–99)
Potassium: 4.4 mmol/L (ref 3.5–5.1)
Sodium: 133 mmol/L — ABNORMAL LOW (ref 135–145)

## 2018-12-21 LAB — TSH: TSH: 4.279 u[IU]/mL (ref 0.350–4.500)

## 2018-12-21 LAB — T4, FREE: Free T4: 1.15 ng/dL — ABNORMAL HIGH (ref 0.61–1.12)

## 2018-12-21 NOTE — Telephone Encounter (Signed)
-----   Message from Larey Dresser, MD sent at 12/16/2018 10:08 PM EDT ----- I think that would be helpful.  I would also like to see him soon.  ----- Message ----- From: Valeda Malm, RN Sent: 12/16/2018  11:33 AM EDT To: Larey Dresser, MD  Pt aware of results. Pt has appt for dr allred on 8/26. Should appt be moved up?

## 2018-12-21 NOTE — Telephone Encounter (Signed)
Unable to move patient appt with Dr Rayann Heman sooner as there are no times available. Dr Aundra Dubin made aware and will address with Dr Rayann Heman.

## 2018-12-23 LAB — T3, FREE: T3, Free: 2.7 pg/mL (ref 2.0–4.4)

## 2018-12-30 ENCOUNTER — Encounter (HOSPITAL_COMMUNITY): Payer: Self-pay | Admitting: Cardiology

## 2018-12-30 ENCOUNTER — Ambulatory Visit (HOSPITAL_COMMUNITY)
Admission: RE | Admit: 2018-12-30 | Discharge: 2018-12-30 | Disposition: A | Discharge status: Home / Self Care | Payer: Medicare Other | Source: Ambulatory Visit | Attending: Cardiology | Admitting: Cardiology

## 2018-12-30 ENCOUNTER — Other Ambulatory Visit: Payer: Self-pay

## 2018-12-30 VITALS — BP 110/80 | HR 105 | Wt 165.4 lb

## 2018-12-30 DIAGNOSIS — I2510 Atherosclerotic heart disease of native coronary artery without angina pectoris: Secondary | ICD-10-CM | POA: Insufficient documentation

## 2018-12-30 DIAGNOSIS — I130 Hypertensive heart and chronic kidney disease with heart failure and stage 1 through stage 4 chronic kidney disease, or unspecified chronic kidney disease: Secondary | ICD-10-CM | POA: Insufficient documentation

## 2018-12-30 DIAGNOSIS — N183 Chronic kidney disease, stage 3 (moderate): Secondary | ICD-10-CM | POA: Insufficient documentation

## 2018-12-30 DIAGNOSIS — Z823 Family history of stroke: Secondary | ICD-10-CM | POA: Insufficient documentation

## 2018-12-30 DIAGNOSIS — Z87891 Personal history of nicotine dependence: Secondary | ICD-10-CM | POA: Diagnosis not present

## 2018-12-30 DIAGNOSIS — Z79899 Other long term (current) drug therapy: Secondary | ICD-10-CM | POA: Diagnosis not present

## 2018-12-30 DIAGNOSIS — Z7901 Long term (current) use of anticoagulants: Secondary | ICD-10-CM | POA: Insufficient documentation

## 2018-12-30 DIAGNOSIS — I352 Nonrheumatic aortic (valve) stenosis with insufficiency: Secondary | ICD-10-CM | POA: Insufficient documentation

## 2018-12-30 DIAGNOSIS — Z8711 Personal history of peptic ulcer disease: Secondary | ICD-10-CM | POA: Insufficient documentation

## 2018-12-30 DIAGNOSIS — Z801 Family history of malignant neoplasm of trachea, bronchus and lung: Secondary | ICD-10-CM | POA: Diagnosis not present

## 2018-12-30 DIAGNOSIS — I4819 Other persistent atrial fibrillation: Secondary | ICD-10-CM | POA: Insufficient documentation

## 2018-12-30 DIAGNOSIS — I447 Left bundle-branch block, unspecified: Secondary | ICD-10-CM | POA: Diagnosis not present

## 2018-12-30 DIAGNOSIS — I5022 Chronic systolic (congestive) heart failure: Secondary | ICD-10-CM | POA: Diagnosis not present

## 2018-12-30 DIAGNOSIS — I6522 Occlusion and stenosis of left carotid artery: Secondary | ICD-10-CM | POA: Insufficient documentation

## 2018-12-30 DIAGNOSIS — E785 Hyperlipidemia, unspecified: Secondary | ICD-10-CM | POA: Insufficient documentation

## 2018-12-30 LAB — BASIC METABOLIC PANEL
Anion gap: 10 (ref 5–15)
BUN: 26 mg/dL — ABNORMAL HIGH (ref 8–23)
CO2: 23 mmol/L (ref 22–32)
Calcium: 8.7 mg/dL — ABNORMAL LOW (ref 8.9–10.3)
Chloride: 98 mmol/L (ref 98–111)
Creatinine, Ser: 1.29 mg/dL — ABNORMAL HIGH (ref 0.61–1.24)
GFR calc Af Amer: >60 mL/min (ref >60)
GFR calc non Af Amer: 53 mL/min — ABNORMAL LOW (ref >60)
Glucose, Bld: 123 mg/dL — ABNORMAL HIGH (ref 70–99)
Potassium: 4.3 mmol/L (ref 3.5–5.1)
Sodium: 131 mmol/L — ABNORMAL LOW (ref 135–145)

## 2018-12-30 MED ORDER — SPIRONOLACTONE 25 MG PO TABS: 12.5000 mg | ORAL_TABLET | Freq: Every evening | ORAL | 3 refills | Status: DC

## 2018-12-30 MED ORDER — LOSARTAN POTASSIUM 25 MG PO TABS: 25.0000 mg | ORAL_TABLET | Freq: Every evening | ORAL | 3 refills | Status: DC

## 2018-12-30 MED ORDER — DIGOXIN 125 MCG PO TABS: 0.1250 mg | ORAL_TABLET | Freq: Every day | ORAL | 3 refills | Status: DC

## 2018-12-30 NOTE — Patient Instructions (Signed)
Lab work done today. We will notify you of any abnormal lab work. No news is good news.  Lab work will need to be done again in 10 days.  EKG done today.  START Digoxin 0.125mg  daily.  Please take Losartan and spironolactone in the evenings.  Please keep follow up appointment.  Please get in touch with Dr. Jackalyn [place]ard office to change your appointment to sooner if possible.  At the [facility], you and your health needs are our priority. As part of our continuing mission to provide you with exceptional heart care, we have created designated Provider Care Teams. These Care Teams include your primary Cardiologist (physician) and Advanced Practice Providers (APPs- Physician Assistants and Nurse Practitioners) who all work together to provide you with the care you need, when you need it.   You may see any of the following providers on your designated Care Team at your next follow up: Marland Kitchen Dr Glori Bickers . Dr [name] . Darrick Grinder, NP   Please be sure to bring in all your medications bottles to every appointment.

## 2018-12-30 NOTE — Progress Notes (Signed)
Date:  12/30/2018   ID:  Travis [name], DOB 02/18/1941, MRN [id number]   Provider location: [place] [facility] Type of Visit: Established patient   PCP:  [name], MD  Cardiologist: [name] EP: [name]  Chief Complaint: Fatigue   History of Present Illness: Travis [name] is a 78 y.o. male with h/o persistent atrial fibrillation s/p multiple failed DCCV, failed Tikosyn, ERAF after atrial fibrillation ablation, bicuspid AV with moderate AS and Mild AI, HTN, HLD, and chronic systolic CHF (EF 14-78%) with presumed tachy-mediated CMP.   Pt failed DCCV 08/14/17, 08/31/17, and 09/17/17. Failed Tikosyn. Did have conversion with Amiodarone and DCCV. Pt underwent successful ablation 10/07/17.  However, he had ERAF.  Admitted 5/18 - 10/14/17 with atrial fibrillation/RVR and acute on chronic systolic CHF.  Echo showed EF 30-35%. Diuresed and reloaded with Amio. Underwent successful DCCV 10/13/17.    Echo in 9/19 showed improvement in EF to 50% in NSR.   In 9/19, patient reported profound fatigue.  He was volume overloaded on exam and diuretic was increased.  CBC was done, hgb was low and patient ended up going to the ER for admission.  He was diagnosed with UGIB likely from duodenal ulcer, probably due to meloxicam.  This was stopped.   Again in 10/19, he was profoundly fatigued.  CBC showed hgb 6.7, he was sent to the ER where he had 2 unit transfusion and EGD was done again, showing duodenal stenosis.  I also stopped his atorvastatin.   He went back into atrial fibrillation and had successful TEE-DCCV in 2/20.  TEE showed EF 35-40% with mildly decreased RV systolic function, EF back down in setting of atrial fibrillation.  In 3/20, he was back in atrial fibrillation and has been in atrial fibrillation since then.  He saw [name] and it was decided not to pursue redo ablation   Patient returns for followup of CHF and atrial fibrillation.  He had inguinal hernia  surgery without complication.  Repeat echo in 7/20 showed EF 20-25%, moderately decreased RV systolic function, moderate-severe TR, moderate AI, dilated IVC.  He is short of breath pulling his garbage can down the driveway.  No dyspnea walking on flat ground.  He has generalized fatigue.  No syncope but occasional lightheaded episodes.  SBP will drop to the 80s-90s at times.  He remains in atrial fibrillation.   ECG (personally reviewed): atrial fibrillation, LBBB 158 msec  Labs (3/19): LDL 65 Labs (8/19): K 5.2 => 4.1, creatinine 1.6 Labs (9/19): K 3.9, creatinine 1.78, hgb 9.6, TSH normal.  Labs (10/19): hgb 6.7 => 11.8, LFTs normal, K 3.6, creatinine 1.3 Labs (11/19): LFTs, TSH normal Labs (1/20): K 4, creatinine 1.15, hgb 14.5 Labs (2/20): Hgb 13.4 Labs (4/20): LFTs normal, TSH elevated Labs (6/20): LDL 38, HDL 64, K 4.2, creatinine 1.45, hgb 14.7 Labs (7/20): K 4.4, creatinine 1.43, TSH and free T3 normal, mildly elevated free T4, LFTs normal  Past Medical History 1. Chronic systolic CHF: Possible tachy-mediated cardiomyopathy.    - Echo(08/13/2017): LVEF 30-35%, moderate AS, mild AI, severe LAE, RV mild dilated with systolic function mild/mod reduced, PA peak pressure 46 mm Hg. - Echo (9/19): EF 50%, mild diffuse hypokinesis, normal RV size and systolic function, PASP 70 mmHg, moderate AS, moderate AI, moderate MR.  - TEE (2/20): EF 35-40%, mild-moderate LV dilation, normal RV size with mildly decreased systolic function, mil MR, trileaflet aortic valve with mild AS (mean gradient  10 mmHg, AVA 1.7 cm^2), moderate AI.  - Echo (7/20): EF 20-25%, moderately decreased RV systolic function, moderate-severe TR, moderate AI, dilated IVC.  2. Atrial fibrillation: Has been difficult to control.  Failed Tikosyn and multiple DCCVs.  Failed amiodarone prior to ablation.  Atrial fibrillation ablation but then had ERAF.  He was cardioverted again in 5/19 with return to NSR.  DCCV again in 2/20 but  back in atrial fibrillation in 3/20.  3. Aortic valve disorder: TEE 2/20 with mild AS, moderate AI (valve is not bicuspid).   4. HTN 5. Hyperlipidemia: Severe fatigue with atorvastatin, myalgias with Crestor.  6. Mitral regurgitation: Mild on last echo.  7. CAD: coronary calcium noted on chest CT.  8. CKD: Stage 3.  9. Carotid stenosis: Carotid dopplers (7/20) with 94-70% LICA stenosis.  - Carotid dopplers (9/62): 83-66% LICA stenosis.  10. Chronic LBBB 11. PUD: UGIB in 9/19 with duodenal ulcer probably due to meloxicam use.  - EGD (10/19): Duodenal stenosis.  12. Inguinal hernia  Current Outpatient Medications  Medication Sig Dispense Refill  . amiodarone (PACERONE) 200 MG tablet Take 200 mg by mouth daily.    . Carboxymethylcell-Hypromellose (GENTEAL) 0.25-0.3 % GEL Place 1 drop into both eyes daily as needed (dry eyes).     . carvedilol (COREG) 25 MG tablet Take 1 tablet (25 mg total) by mouth 2 (two) times daily. 180 tablet 3  . Evolocumab (REPATHA SURECLICK) 294 MG/ML SOAJ Inject into the skin. Inject on wednesdays every 14 days    . finasteride (PROPECIA) 1 MG tablet Take 1 mg by mouth daily.    Marland Kitchen losartan (COZAAR) 25 MG tablet Take 1 tablet (25 mg total) by mouth every evening. 90 tablet 3  . Multiple Vitamins-Minerals (PRESERVISION AREDS 2 PO) Take 1 tablet by mouth 2 (two) times a day.    . pantoprazole (PROTONIX) 40 MG tablet Take 40 mg by mouth 2 (two) times daily.    . Rivaroxaban (XARELTO) 15 MG TABS tablet Take 15 mg by mouth daily with supper.    Marland Kitchen spironolactone (ALDACTONE) 25 MG tablet Take 0.5 tablets (12.5 mg total) by mouth every evening. 45 tablet 3  . temazepam (RESTORIL) 15 MG capsule Take 15 mg by mouth at bedtime as needed for sleep.    Marland Kitchen torsemide (DEMADEX) 20 MG tablet Take 40 mg by mouth daily.    . traMADol (ULTRAM) 50 MG tablet Take 50 mg by mouth 3 (three) times daily as needed for moderate pain (arthritis,neck pain).    Marland Kitchen digoxin (LANOXIN) 0.125 MG tablet  Take 1 tablet (0.125 mg total) by mouth daily. 90 tablet 3   No current facility-administered medications for this encounter.     Allergies:   Patient has no known allergies.   Social History:  The patient  reports that he quit smoking about 32 years ago. He has a 37.50 pack-year smoking history. He has never used smokeless tobacco. He reports previous alcohol use of about 1.0 standard drinks of alcohol per week. He reports that he does not use drugs.   Family History:  The patient's family history includes Lung cancer (age of onset: 84) in his mother; Stroke in his brother and mother; Stroke (age of onset: 70) in his father.   ROS:  Please see the history of present illness.   All other systems are personally reviewed and negative.   Exam:   BP 110/80   Pulse (!) 105   Wt 75 kg (165 lb 6.4 oz)  SpO2 96%   BMI 25.15 kg/m  General: NAD Neck: No JVD, no thyromegaly or thyroid nodule.  Lungs: Clear to auscultation bilaterally with normal respiratory effort. CV: Nondisplaced PMI.  Heart irregular S1/S2, no S3/S4, 2/6 HSM LLSB.  No peripheral edema.  No carotid bruit.  Normal pedal pulses.  Abdomen: Soft, nontender, no hepatosplenomegaly, no distention.  Skin: Intact without lesions or rashes.  Neurologic: Alert and oriented x 3.  Psych: Normal affect. Extremities: No clubbing or cyanosis.  HEENT: Normal.   Recent Labs: 12/06/2018: ALT 15; Hemoglobin 14.2; Platelets 270 12/21/2018: TSH 4.279 12/30/2018: BUN 26; Creatinine, Ser 1.29; Potassium 4.3; Sodium 131  Personally reviewed   Wt Readings from Last 3 Encounters:  12/30/18 75 kg (165 lb 6.4 oz)  11/24/18 72.6 kg (160 lb)  11/23/18 74.4 kg (164 lb)      ASSESSMENT AND PLAN:  1. Chronic systolic CHF: Echo 4/65 with EF 30-35%, mild to moderately decreased RV systolic function in setting of atrial fibrillation with RVR.  Echo in 9/19 with patient in NSR showed EF up to 50%, mild diffuse hypokinesis.  Back in atrial fibrillation  in 2/20, TEE showed EF 35-40%.  Suspect he has atrial fibrillation/tachycardia-mediated cardiomyopathy given fall in EF with recurrent atrial fibrillation.  Most recent echo in 7/20 with EF 20-25%, moderately decreased RV systolic function.  NYHA class II symptoms.  He does not appear volume overloaded on exam.  He has a wide LBBB.  BP is low at times.  - Continue torsemide 40 daily.  - HR continues to run > 100 bpm at times, I will add digoxin 0.125 mg daily.  Digoxin level in 10 days.  - Continue losartan 25 mg daily, I will not increase dose given episodes of low BP.   He will take this medication in the evening to limit symptomatic orthostasis.  - Continue Coreg 25 mg bid.     - Continue spironolactone 12.5 mg daily.  -[name] has decided not to repeat atrial fibrillation ablation.  We have been considering AV nodal ablation with BiV pacing.  As heart rate appeared controlled, [name] decided to hold off on this at last appointment.  However, HR has been > 100 at times.  Additionally, he has wide LBBB (158 msec on ECG today).  Echo now with persistently low EF.  I would favor BiV pacing with AV nodal ablation, will see if I can get him in soon with [name] to discuss this.  2. Atrial fibrillation: Persistent.  Difficult to control rate and rhythm. He failed Tikosyn. He started on amiodarone, then had atrial fibrillation ablation in 5/19 with ERAF, requiring DCCV.  Most recently cardioverted in 2/20 but back in atrial fibrillation by 3/20 and has stayed in atrial fibrillation since.  He saw [name] and they decided against redo ablation. HR fluctuates, 80s-110s generally.   - Continue amiodarone for now, but will stop if no further attempts made for rhythm control => will hold off on this until he has an appointment with [name].  LFTs normal recently, free T4 mildly elevated but normal free T3 and TSH.   He will need a regular eye exam.  - As above, I favor CRT with AV nodal ablation.   - Continue Xarelto 15 mg daily.  3. CAD: Extensive coronary calcification on last CT chest. No chest pain. I suspect that his cardiomyopathy is tachycardia-mediated/atrial fibrillation-related given rise in EF in NSR and then fall when back in atrial fibrillation rather  than ischemic cardiomyopathy.  - No ASA with Xarelto use.  - Unable to tolerate statins.  He will continue Repatha, good LDL on last lipids check.   4. CKD: Stage 3. BMET today.  5. Aortic valve disease: TEE in 2/20 showed that aortic valve was not bicuspid but did have mild stenosis and moderate AI.  Moderate AI on 7/20 echo.  6. Tricuspid regurgitation: Moderate-severe on 7/20 echo.   7. LBBB: Chronic.  8. Hyperlipidemia: Unable to tolerate statins.  Now on Repatha.  Good lipids in 5/20.  9. Carotid stenosis: Repeat carotid dopplers in 7/21.    Recommended follow-up:  2 months  Signed, [name], MD  12/30/2018  [place] [address] [facility] [place] [address] [phone] (office) [phone] (fax)

## 2019-01-03 ENCOUNTER — Telehealth (INDEPENDENT_AMBULATORY_CARE_PROVIDER_SITE_OTHER): Payer: Medicare Other | Admitting: Internal Medicine

## 2019-01-03 ENCOUNTER — Other Ambulatory Visit: Payer: Self-pay

## 2019-01-03 ENCOUNTER — Encounter: Payer: Self-pay | Admitting: Internal Medicine

## 2019-01-03 ENCOUNTER — Encounter

## 2019-01-03 VITALS — Ht 68.0 in | Wt 164.0 lb

## 2019-01-03 DIAGNOSIS — I4819 Other persistent atrial fibrillation: Secondary | ICD-10-CM

## 2019-01-03 DIAGNOSIS — R000 Tachycardia, unspecified: Secondary | ICD-10-CM

## 2019-01-03 DIAGNOSIS — I5022 Chronic systolic (congestive) heart failure: Secondary | ICD-10-CM

## 2019-01-03 DIAGNOSIS — I447 Left bundle-branch block, unspecified: Secondary | ICD-10-CM | POA: Diagnosis not present

## 2019-01-03 NOTE — Progress Notes (Signed)
Electrophysiology TeleHealth Note   Due to national recommendations of social distancing due to Ricardo 19, an audio telehealth visit is felt to be most appropriate for this patient at this time.  Verbal consent was obtained by me for the telehealth visit today.  The patient does not have capability for a virtual visit.  A phone visit is therefore required today.   Date:  01/03/2019   ID:  Travis [name], DOB [birth year], MRN [id number]  Location: patient's home  Provider location:  [facility]  Evaluation Performed: Follow-up visit  PCP:  [name], MD   Electrophysiologist:  Dr Rayann Heman  Chief Complaint:  palpitations  History of Present Illness:    Travis [name] is a 78 y.o. male who presents via telehealth conferencing today.  Since last being seen in our clinic, the patient reports doing reasonably well.  He has frequent "dizzy spells" but denies syncope.  + fatigue and decreased exercise tolerance.  EF remains low with AF but had previously recovered with sinus rhythm.  Today, he denies symptoms of palpitations, chest pain, shortness of breath,  lower extremity edema, or syncope.  The patient is otherwise without complaint today.  The patient denies symptoms of fevers, chills, cough, or new SOB worrisome for COVID 19.  Past Medical History:  Diagnosis Date  . Arthritis   . Biatrial enlargement    severe  . Bicuspid aortic valve   . CHF (congestive heart failure) ([place])    JULY 2014  . Chronic renal insufficiency    not aware  . GERD (gastroesophageal reflux disease)   . History of cardioversion 12/16/2012  . History of stomach ulcers   . Hypertension   . Left bundle branch block   . Nonischemic cardiomyopathy ([place])       . Persistent atrial fibrillation    multiple prior cardioversion  . Sinus bradycardia   . Status post clamping of cerebral aneurysm 80    Past Surgical History:  Procedure Laterality Date  . ATRIAL FIBRILLATION ABLATION N/A 10/06/2017   Procedure: ATRIAL FIBRILLATION ABLATION;  Surgeon: [name], MD;  Location: [place] [facility];  Service: Cardiovascular;  Laterality: N/A;  . BIOPSY  02/06/2018   Procedure: BIOPSY;  Surgeon: Lavena Bullion, DO;  Location: [place] [facility];  Service: Gastroenterology;;  . BIOPSY  02/28/2018   Procedure: BIOPSY;  Surgeon: Mauri Pole, MD;  Location: [place];  Service: Endoscopy;;  . CARDIAC CATHETERIZATION     not awaer of this  . CARDIOVERSION N/A 12/16/2012   Procedure: CARDIOVERSION;  Surgeon: Lelon Perla, MD;  Location[facility] ENDOSCOPY;  Service: Cardiovascular;  Laterality: N/A;  . CARDIOVERSION N/A 08/14/2017   Procedure: CARDIOVERSION;  Surgeon: Josue Hector, MD;  Location: [facility] ENDOSCOPY;  Service: Cardiovascular;  Laterality: N/A;  . CARDIOVERSION N/A 09/21/2017   Procedure: CARDIOVERSION;  Surgeon: Sanda Klein, MD;  Location: [place] ENDOSCOPY;  Service: Cardiovascular;  Laterality: N/A;  . CARDIOVERSION N/A 10/13/2017   Procedure: CARDIOVERSION;  Surgeon: Josue Hector, MD;  Location[facility]COPY;  Service: Cardiovascular;  Laterality: N/A;  . CARDIOVERSION N/A 07/05/2018   Procedure: CARDIOVERSION;  Surgeon: Larey Dresser, MD;  Location: [facility] ENDOSCOPY;  Service: Cardiovascular;  Laterality: N/A;  . CATARACT EXTRACTION  05/2015   bilateral  . cerebral anuersym post clips    . ESOPHAGOGASTRODUODENOSCOPY N/A 02/06/2018   Procedure: ESOPHAGOGASTRODUODENOSCOPY (EGD);  Surgeon: Lavena Bullion, DO;  Location: [facility] ENDOSCOPY;  Service: Gastroenterology;  Laterality: N/A;  .  ESOPHAGOGASTRODUODENOSCOPY N/A 02/28/2018   Procedure: ESOPHAGOGASTRODUODENOSCOPY (EGD);  Surgeon: Mauri Pole, MD;  Location: [facility] ENDOSCOPY;  Service: Endoscopy;  Laterality: N/A;  . fractured left arm    . HERNIA REPAIR     lft  . INGUINAL HERNIA REPAIR  07/02/2011   Procedure: LAPAROSCOPIC INGUINAL HERNIA;  Surgeon: Harl Bowie, MD;  Location: [place];  Service: General;  Laterality:  Left;  Laparoscopic left inguinal hernia repair and mesh  . INGUINAL HERNIA REPAIR Left 11/24/2018   Procedure: OPEN LEFT INGUINAL HERNIA REPAIR WITH MESH;  Surgeon: Coralie Keens, MD;  Location: [place];  Service: General;  Laterality: Left;  TAP BLOCK  . left tendon repair     lft foot  . ROTATOR CUFF REPAIR     lf  . TEE WITHOUT CARDIOVERSION N/A 08/14/2017   Procedure: TRANSESOPHAGEAL ECHOCARDIOGRAM (TEE);  Surgeon: Josue Hector, MD;  Location: [facility] ENDOSCOPY;  Service: Cardiovascular;  Laterality: N/A;  . TEE WITHOUT CARDIOVERSION N/A 07/05/2018   Procedure: TRANSESOPHAGEAL ECHOCARDIOGRAM (TEE);  Surgeon: Larey Dresser, MD;  Location: [facility] ENDOSCOPY;  Service: Cardiovascular;  Laterality: N/A;  . TONSILLECTOMY    . TOTAL KNEE ARTHROPLASTY Bilateral 02/06/2014   Procedure: TOTAL KNEE BILATERAL;  Surgeon: Mauri Pole, MD;  Location: [facility];  Service: Orthopedics;  Laterality: Bilateral;  . WRIST GANGLION EXCISION     lft    Current Outpatient Medications  Medication Sig Dispense Refill  . amiodarone (PACERONE) 200 MG tablet Take 200 mg by mouth daily.    . Carboxymethylcell-Hypromellose (GENTEAL) 0.25-0.3 % GEL Place 1 drop into both eyes daily as needed (dry eyes).     . carvedilol (COREG) 25 MG tablet Take 1 tablet (25 mg total) by mouth 2 (two) times daily. 180 tablet 3  . digoxin (LANOXIN) 0.125 MG tablet Take 1 tablet (0.125 mg total) by mouth daily. 90 tablet 3  . Evolocumab (REPATHA SURECLICK) 191 MG/ML SOAJ Inject into the skin. Inject on wednesdays every 14 days    . finasteride (PROPECIA) 1 MG tablet Take 1 mg by mouth daily.    Marland Kitchen losartan (COZAAR) 25 MG tablet Take 1 tablet (25 mg total) by mouth every evening. 90 tablet 3  . Multiple Vitamins-Minerals (PRESERVISION AREDS 2 PO) Take 1 tablet by mouth 2 (two) times a day.    . pantoprazole (PROTONIX) 40 MG tablet Take 40 mg by mouth 2 (two) times daily.    . Rivaroxaban (XARELTO) 15 MG TABS tablet Take 15 mg by mouth daily  with supper.    Marland Kitchen spironolactone (ALDACTONE) 25 MG tablet Take 0.5 tablets (12.5 mg total) by mouth every evening. 45 tablet 3  . temazepam (RESTORIL) 15 MG capsule Take 15 mg by mouth at bedtime as needed for sleep.    Marland Kitchen torsemide (DEMADEX) 20 MG tablet Take 40 mg by mouth daily.    . traMADol (ULTRAM) 50 MG tablet Take 50 mg by mouth 3 (three) times daily as needed for moderate pain (arthritis,neck pain).     No current facility-administered medications for this visit.     Allergies:   Patient has no known allergies.   Social History:  The patient  reports that he quit smoking about 32 years ago. He has a 37.50 pack-year smoking history. He has never used smokeless tobacco. He reports previous alcohol use of about 1.0 standard drinks of alcohol per week. He reports that he does not use drugs.   Family History:  The patient's  family history includes  Lung cancer (age of onset: 68) in his mother; Stroke in his brother and mother; Stroke (age of onset: 43) in his father.   ROS:  Please see the history of present illness.   All other systems are personally reviewed and negative.    Exam:    Vital Signs:  Ht 5\' 8"  (1.727 m)   Wt 164 lb (74.4 kg)   BMI 24.94 kg/m   Well sounding   Labs/Other Tests and Data Reviewed:    Recent Labs: 12/06/2018: ALT 15; Hemoglobin 14.2; Platelets 270 12/21/2018: TSH 4.279 12/30/2018: BUN 26; Creatinine, Ser 1.29; Potassium 4.3; Sodium 131   Wt Readings from Last 3 Encounters:  01/03/19 164 lb (74.4 kg)  12/30/18 165 lb 6.4 oz (75 kg)  11/24/18 160 lb (72.6 kg)     ASSESSMENT & PLAN:    1.  Persistent afib with LBBB and tachycardia mediated CM The patient has medicine refractory afib.  I did perform ablation 09/2017 and noted severe atrial enlargement then.  I agree with Dr Aundra Dubin that CRT-P with AV nodal ablation is appropriate for this patient.   I would therefore recommend biventricular pacemaker implantation with AV nodal ablation at this time.   Risks, benefits, alternatives to pacemaker implantation were discussed in detail with the patient today. The patient understands that the risks include but are not limited to bleeding, infection, pneumothorax, perforation, tamponade, vascular damage, renal failure, MI, stroke, death,  and lead dislodgement and wishes to proceed. We will therefore schedule the procedure at the next available time.  Given my schedule, I have advised that he have Dr Caryl Comes perform the procedure.  He states "I have been followed by Dr Caryl Comes for years and would be happy for him to do this."  I will therefore reach out to Dr Caryl Comes to see if his schedule will allow for the procedure in the next few weeks.  Patient Risk:  after full review of this patients clinical status, I feel that they are at moderate risk at this time.  Today, I have spent 15 minutes with the patient with telehealth technology discussing arrhythmia management .    Army Fossa, MD  01/03/2019 8:58 AM     [place] [address] [place] [address] [phone] (office) [phone] (fax)

## 2019-01-04 ENCOUNTER — Telehealth: Payer: Self-pay

## 2019-01-04 NOTE — Telephone Encounter (Signed)
PA required for Temazepam 15 mg cap, qty 30  [phone], ID [id number]

## 2019-01-05 NOTE — Telephone Encounter (Signed)
Unable to process PA. 

## 2019-01-06 ENCOUNTER — Telehealth (INDEPENDENT_AMBULATORY_CARE_PROVIDER_SITE_OTHER): Payer: Medicare Other | Admitting: Internal Medicine

## 2019-01-06 ENCOUNTER — Other Ambulatory Visit: Payer: Self-pay

## 2019-01-06 DIAGNOSIS — I4821 Permanent atrial fibrillation: Secondary | ICD-10-CM

## 2019-01-06 DIAGNOSIS — Q231 Congenital insufficiency of aortic valve: Secondary | ICD-10-CM

## 2019-01-06 DIAGNOSIS — I110 Hypertensive heart disease with heart failure: Secondary | ICD-10-CM | POA: Diagnosis not present

## 2019-01-06 DIAGNOSIS — I429 Cardiomyopathy, unspecified: Secondary | ICD-10-CM | POA: Diagnosis not present

## 2019-01-06 DIAGNOSIS — Z87891 Personal history of nicotine dependence: Secondary | ICD-10-CM | POA: Diagnosis not present

## 2019-01-06 DIAGNOSIS — I4819 Other persistent atrial fibrillation: Secondary | ICD-10-CM

## 2019-01-06 DIAGNOSIS — I5022 Chronic systolic (congestive) heart failure: Secondary | ICD-10-CM | POA: Diagnosis not present

## 2019-01-06 DIAGNOSIS — I447 Left bundle-branch block, unspecified: Secondary | ICD-10-CM

## 2019-01-06 DIAGNOSIS — N289 Disorder of kidney and ureter, unspecified: Secondary | ICD-10-CM | POA: Diagnosis not present

## 2019-01-06 NOTE — Progress Notes (Signed)
Electrophysiology TeleHealth Note   Due to national recommendations of social distancing due to COVID 19, an audio/video telehealth visit is felt to be most appropriate for this patient at this time.  See MyChart message from today for the patient's consent to telehealth for [facility].   Date:  01/06/2019   ID:  Travis [name], DOB Aug 14, 1940, MRN [id number]  Location: patient's home  Provider location: [address], [place]  Evaluation Performed: Follow-up visit  PCP:  [name], MD  Cardiologist:   DM Electrophysiologist:  SK   Chief Complaint:   Afib   History of Present Illness:    Travis [name] is a 78 y.o. male who presents via audio/video conferencing for a telehealth visit today. The patient did not have access to video technology/had technical difficulties with video requiring transitioning to audio format only (telephone).  All issues noted in this document were discussed and addressed.  No physical exam could be performed with this format.      Since last being seen in our clinic for AFIB the patient reports persistent atrial fib assoc with weakness and fatigue  Some swelling new== but on vacation No chest pain  Persistent AFib with LBBB-- hx of prior ablation with significant LAE  Saw Dr Rayann Heman by telehealth visit  And recommended, following eval by Dr DM to pursue CRT-P and AV Ablation  DATE TEST EF   4/18 Echo  55-60% AS mild  3/19 Echo   30.35 %   7/20 Echo   20.25 % AoV bicuspid  No AS  Mod AI         Date Cr K Hgb  8/20 1.29 4.3 14.2           The patient denies symptoms of fevers, chills, cough, or new SOB worrisome for COVID 19.    Past Medical History:  Diagnosis Date  . Arthritis   . Biatrial enlargement    severe  . Bicuspid aortic valve   . CHF (congestive heart failure) ([place])    JULY 2014  . Chronic renal insufficiency    not aware  . GERD (gastroesophageal reflux disease)   . History of cardioversion  12/16/2012  . History of stomach ulcers   . Hypertension   . Left bundle branch block   . Nonischemic cardiomyopathy ([place])       . Persistent atrial fibrillation    multiple prior cardioversion  . Sinus bradycardia   . Status post clamping of cerebral aneurysm 80    Past Surgical History:  Procedure Laterality Date  . ATRIAL FIBRILLATION ABLATION N/A 10/06/2017   Procedure: ATRIAL FIBRILLATION ABLATION;  Surgeon: [name], MD;  Location: [place] [facility];  Service: Cardiovascular;  Laterality: N/A;  . BIOPSY  02/06/2018   Procedure: BIOPSY;  Surgeon: Lavena Bullion, DO;  Location: [facility] ENDOSCOPY;  Service: Gastroenterology;;  . BIOPSY  02/28/2018   Procedure: BIOPSY;  Surgeon: Mauri Pole, MD;  Location: [place];  Service: Endoscopy;;  . CARDIAC CATHETERIZATION     not awaer of this  . CARDIOVERSION N/A 12/16/2012   Procedure: CARDIOVERSION;  Surgeon: Lelon Perla, MD;  Location: [facility] ENDOSCOPY;  Service: Cardiovascular;  Laterality: N/A;  . CARDIOVERSION N/A 08/14/2017   Procedure: CARDIOVERSION;  Surgeon: Josue Hector, MD;  Location: [facility] ENDOSCOPY;  Service: Cardiovascular;  Laterality: N/A;  . CARDIOVERSION N/A 09/21/2017   Procedure: CARDIOVERSION;  Surgeon: Sanda , MD;  Location: [facility] ENDOSCOPY;  Service: Cardiovascular;  Laterality: N/A;  . CARDIOVERSION N/A 10/13/2017   Procedure: CARDIOVERSION;  Surgeon: Josue Hector, MD;  Location: [facility] ENDOSCOPY;  Service: Cardiovascular;  Laterality: N/A;  . CARDIOVERSION N/A 07/05/2018   Procedure: CARDIOVERSION;  Surgeon: Larey Dresser, MD;  Location: [facility] ENDOSCOPY;  Service: Cardiovascular;  Laterality: N/A;  . CATARACT EXTRACTION  05/2015   bilateral  . cerebral anuersym post clips    . ESOPHAGOGASTRODUODENOSCOPY N/A 02/06/2018   Procedure: ESOPHAGOGASTRODUODENOSCOPY (EGD);  Surgeon: Lavena Bullion, DO;  Location: [facility] ENDOSCOPY;  Service: Gastroenterology;  Laterality: N/A;  . ESOPHAGOGASTRODUODENOSCOPY  N/A 02/28/2018   Procedure: ESOPHAGOGASTRODUODENOSCOPY (EGD);  Surgeon: Mauri Pole, MD;  Location: [facility] ENDOSCOPY;  Service: Endoscopy;  Laterality: N/A;  . fractured left arm    . HERNIA REPAIR     lft  . INGUINAL HERNIA REPAIR  07/02/2011   Procedure: LAPAROSCOPIC INGUINAL HERNIA;  Surgeon: Harl Bowie, MD;  Location: [place];  Service: General;  Laterality: Left;  Laparoscopic left inguinal hernia repair and mesh  . INGUINAL HERNIA REPAIR Left 11/24/2018   Procedure: OPEN LEFT INGUINAL HERNIA REPAIR WITH MESH;  Surgeon: Coralie Keens, MD;  Location: [place];  Service: General;  Laterality: Left;  TAP BLOCK  . left tendon repair     lft foot  . ROTATOR CUFF REPAIR     lf  . TEE WITHOUT CARDIOVERSION N/A 08/14/2017   Procedure: TRANSESOPHAGEAL ECHOCARDIOGRAM (TEE);  Surgeon: Josue Hector, MD;  Location: [facility] ENDOSCOPY;  Service: Cardiovascular;  Laterality: N/A;  . TEE WITHOUT CARDIOVERSION N/A 07/05/2018   Procedure: TRANSESOPHAGEAL ECHOCARDIOGRAM (TEE);  Surgeon: Larey Dresser, MD;  Location: [facility] ENDOSCOPY;  Service: Cardiovascular;  Laterality: N/A;  . TONSILLECTOMY    . TOTAL KNEE ARTHROPLASTY Bilateral 02/06/2014   Procedure: TOTAL KNEE BILATERAL;  Surgeon: Mauri Pole, MD;  Location: [facility];  Service: Orthopedics;  Laterality: Bilateral;  . WRIST GANGLION EXCISION     lft    Current Outpatient Medications  Medication Sig Dispense Refill  . amiodarone (PACERONE) 200 MG tablet Take 200 mg by mouth daily.    . Carboxymethylcell-Hypromellose (GENTEAL) 0.25-0.3 % GEL Place 1 drop into both eyes daily as needed (dry eyes).     . carvedilol (COREG) 25 MG tablet Take 1 tablet (25 mg total) by mouth 2 (two) times daily. 180 tablet 3  . digoxin (LANOXIN) 0.125 MG tablet Take 1 tablet (0.125 mg total) by mouth daily. 90 tablet 3  . Evolocumab (REPATHA SURECLICK) 778 MG/ML SOAJ Inject into the skin. Inject on wednesdays every 14 days    . finasteride (PROPECIA) 1 MG tablet Take  1 mg by mouth daily.    Marland Kitchen losartan (COZAAR) 25 MG tablet Take 1 tablet (25 mg total) by mouth every evening. 90 tablet 3  . Multiple Vitamins-Minerals (PRESERVISION AREDS 2 PO) Take 1 tablet by mouth 2 (two) times a day.    . pantoprazole (PROTONIX) 40 MG tablet Take 40 mg by mouth 2 (two) times daily.    . Rivaroxaban (XARELTO) 15 MG TABS tablet Take 15 mg by mouth daily with supper.    Marland Kitchen spironolactone (ALDACTONE) 25 MG tablet Take 0.5 tablets (12.5 mg total) by mouth every evening. 45 tablet 3  . temazepam (RESTORIL) 15 MG capsule Take 15 mg by mouth at bedtime as needed for sleep.    Marland Kitchen torsemide (DEMADEX) 20 MG tablet Take 40 mg by mouth daily.    . traMADol (ULTRAM) 50 MG tablet Take 50  mg by mouth 3 (three) times daily as needed for moderate pain (arthritis,neck pain).     No current facility-administered medications for this visit.     Allergies:   Patient has no known allergies.   Social History:  The patient  reports that he quit smoking about 32 years ago. He has a 37.50 pack-year smoking history. He has never used smokeless tobacco. He reports previous alcohol use of about 1.0 standard drinks of alcohol per week. He reports that he does not use drugs.   Family History:  The patient's   family history includes Lung cancer (age of onset: 55) in his mother; Stroke in his brother and mother; Stroke (age of onset: 39) in his father.   ROS:  Please see the history of present illness.   All other systems are personally reviewed and negative.    Exam:    Vital Signs:  There were no vitals taken for this visit.       Labs/Other Tests and Data Reviewed:    Recent Labs: 12/06/2018: ALT 15; Hemoglobin 14.2; Platelets 270 12/21/2018: TSH 4.279 12/30/2018: BUN 26; Creatinine, Ser 1.29; Potassium 4.3; Sodium 131   Wt Readings from Last 3 Encounters:  01/03/19 164 lb (74.4 kg)  12/30/18 165 lb 6.4 oz (75 kg)  11/24/18 160 lb (72.6 kg)     Other studies personally reviewed: Additional  studies/ records that were reviewed today include: As above  ECG AFib 8/20 77 LBBB   ASSESSMENT & PLAN:    Atrial fibrillation- persistent  Cardiomyopathy-tachycardia mediated-recurrent   Bicuspid aortic valve-    Hypertension   CHF chronic systolic  Renal insufficiency  Left bundle branch block   Worsening cardiomyopathy and now permanent atrial fib.  Cardiomyopathy in the past has been thought to be rate related.  Having failed ablation with large atria,Dr Greggory Brandy agrees with Dr DM that CRT-P and AV ablation   Have reviewed and will proceed with procedure as above   COVID 19 screen The patient denies symptoms of COVID 19 at this time.  The importance of social distancing was discussed today.  Follow-up:       Current medicines are reviewed at length with the patient today.   The patient has concerns regarding his medicines.  The following changes were made today:  Increase torsemide to 60 mg daily x 3d   Labs/ tests ordered today include:    Future tests ( post COVID )   in   months  Patient Risk:  after full review of this patients clinical status, I feel that they are at moderate risk at this time.  Today, I have spent 21 minutes with the patient with telehealth technology discussing the above.  Signed, Virl Axe, MD  01/06/2019 2:54 PM     [facility] [address] [place] [address] [phone] (office) [phone] (fax)

## 2019-01-10 ENCOUNTER — Other Ambulatory Visit (HOSPITAL_COMMUNITY): Payer: Self-pay

## 2019-01-10 ENCOUNTER — Other Ambulatory Visit: Payer: Self-pay

## 2019-01-10 ENCOUNTER — Ambulatory Visit (HOSPITAL_COMMUNITY)
Admission: RE | Admit: 2019-01-10 | Discharge: 2019-01-10 | Disposition: A | Discharge status: Home / Self Care | Payer: Medicare Other | Source: Ambulatory Visit | Attending: Internal Medicine | Admitting: Internal Medicine

## 2019-01-10 DIAGNOSIS — I5022 Chronic systolic (congestive) heart failure: Secondary | ICD-10-CM | POA: Diagnosis not present

## 2019-01-10 LAB — DIGOXIN LEVEL: Digoxin Level: 0.9 ng/mL (ref 0.8–2.0)

## 2019-01-12 ENCOUNTER — Other Ambulatory Visit (HOSPITAL_COMMUNITY): Payer: Self-pay | Admitting: *Deleted

## 2019-01-12 MED ORDER — LOSARTAN POTASSIUM 25 MG PO TABS: 25.0000 mg | ORAL_TABLET | Freq: Every evening | ORAL | 3 refills | Status: DC

## 2019-01-14 ENCOUNTER — Telehealth: Payer: Self-pay | Admitting: Internal Medicine

## 2019-01-14 DIAGNOSIS — I4821 Permanent atrial fibrillation: Secondary | ICD-10-CM

## 2019-01-14 NOTE — Telephone Encounter (Signed)
Called and reviewed pre procedure instructions with pt. Please see pt letter for details.   Pt has verbalized understanding and agreed with plan. He had no additional questions.

## 2019-01-14 NOTE — Telephone Encounter (Signed)
New Message   Patient returning call from Monday about procedure. Please give patient a call back.

## 2019-01-17 ENCOUNTER — Encounter (HOSPITAL_COMMUNITY): Payer: Medicare Other | Admitting: Cardiology

## 2019-01-17 ENCOUNTER — Ambulatory Visit: Payer: Medicare Other | Admitting: Internal Medicine

## 2019-01-19 ENCOUNTER — Ambulatory Visit: Payer: Medicare Other | Admitting: Internal Medicine

## 2019-01-20 ENCOUNTER — Encounter (HOSPITAL_COMMUNITY): Payer: Medicare Other | Admitting: Cardiology

## 2019-01-21 ENCOUNTER — Other Ambulatory Visit: Payer: Self-pay

## 2019-01-21 ENCOUNTER — Other Ambulatory Visit (HOSPITAL_COMMUNITY)
Admission: RE | Admit: 2019-01-21 | Discharge: 2019-01-21 | Disposition: A | Discharge status: Home / Self Care | Payer: Medicare Other | Source: Ambulatory Visit | Attending: Internal Medicine | Admitting: Internal Medicine

## 2019-01-21 ENCOUNTER — Other Ambulatory Visit: Payer: Medicare Other | Admitting: *Deleted

## 2019-01-21 DIAGNOSIS — I4821 Permanent atrial fibrillation: Secondary | ICD-10-CM | POA: Diagnosis not present

## 2019-01-21 DIAGNOSIS — Z20828 Contact with and (suspected) exposure to other viral communicable diseases: Secondary | ICD-10-CM | POA: Insufficient documentation

## 2019-01-21 DIAGNOSIS — Z01812 Encounter for preprocedural laboratory examination: Secondary | ICD-10-CM | POA: Insufficient documentation

## 2019-01-21 LAB — CBC
Hematocrit: 43 % (ref 37.5–51.0)
Hemoglobin: 14.5 g/dL (ref 13.0–17.7)
MCH: 34.4 pg — ABNORMAL HIGH (ref 26.6–33.0)
MCHC: 33.7 g/dL (ref 31.5–35.7)
MCV: 102 fL — ABNORMAL HIGH (ref 79–97)
Platelets: 208 10*3/uL (ref 150–450)
RBC: 4.21 x10E6/uL (ref 4.14–5.80)
RDW: 13.7 % (ref 11.6–15.4)
WBC: 7.8 10*3/uL (ref 3.4–10.8)

## 2019-01-21 LAB — SARS CORONAVIRUS 2 (TAT 6-24 HRS): SARS Coronavirus 2: NEGATIVE

## 2019-01-21 LAB — BASIC METABOLIC PANEL
BUN/Creatinine Ratio: 21 (ref 10–24)
BUN: 26 mg/dL (ref 8–27)
CO2: 25 mmol/L (ref 20–29)
Calcium: 9 mg/dL (ref 8.6–10.2)
Chloride: 95 mmol/L — ABNORMAL LOW (ref 96–106)
Creatinine, Ser: 1.26 mg/dL (ref 0.76–1.27)
GFR calc Af Amer: 63 mL/min/{1.73_m2} (ref >59)
GFR calc non Af Amer: 54 mL/min/{1.73_m2} — ABNORMAL LOW (ref >59)
Glucose: 109 mg/dL — ABNORMAL HIGH (ref 65–99)
Potassium: 4.9 mmol/L (ref 3.5–5.2)
Sodium: 134 mmol/L (ref 134–144)

## 2019-01-24 ENCOUNTER — Ambulatory Visit (HOSPITAL_COMMUNITY)
Admission: RE | Admit: 2019-01-24 | Discharge: 2019-01-24 | Disposition: A | Discharge status: Home / Self Care | Payer: Medicare Other | Attending: Internal Medicine | Admitting: Internal Medicine

## 2019-01-24 ENCOUNTER — Other Ambulatory Visit: Payer: Self-pay

## 2019-01-24 ENCOUNTER — Ambulatory Visit (HOSPITAL_COMMUNITY)
Admission: RE | Disposition: A | Discharge status: Home / Self Care | Payer: Self-pay | Source: Home / Self Care | Attending: Internal Medicine

## 2019-01-24 ENCOUNTER — Ambulatory Visit (HOSPITAL_COMMUNITY): Payer: Medicare Other

## 2019-01-24 DIAGNOSIS — Z79899 Other long term (current) drug therapy: Secondary | ICD-10-CM | POA: Insufficient documentation

## 2019-01-24 DIAGNOSIS — K219 Gastro-esophageal reflux disease without esophagitis: Secondary | ICD-10-CM | POA: Insufficient documentation

## 2019-01-24 DIAGNOSIS — I4819 Other persistent atrial fibrillation: Secondary | ICD-10-CM | POA: Diagnosis not present

## 2019-01-24 DIAGNOSIS — Z7901 Long term (current) use of anticoagulants: Secondary | ICD-10-CM | POA: Insufficient documentation

## 2019-01-24 DIAGNOSIS — Z950 Presence of cardiac pacemaker: Secondary | ICD-10-CM | POA: Diagnosis not present

## 2019-01-24 DIAGNOSIS — I429 Cardiomyopathy, unspecified: Secondary | ICD-10-CM | POA: Diagnosis not present

## 2019-01-24 DIAGNOSIS — I447 Left bundle-branch block, unspecified: Secondary | ICD-10-CM | POA: Diagnosis not present

## 2019-01-24 DIAGNOSIS — I5022 Chronic systolic (congestive) heart failure: Secondary | ICD-10-CM

## 2019-01-24 DIAGNOSIS — R000 Tachycardia, unspecified: Secondary | ICD-10-CM | POA: Diagnosis not present

## 2019-01-24 DIAGNOSIS — I428 Other cardiomyopathies: Secondary | ICD-10-CM

## 2019-01-24 DIAGNOSIS — I110 Hypertensive heart disease with heart failure: Secondary | ICD-10-CM | POA: Diagnosis not present

## 2019-01-24 DIAGNOSIS — N289 Disorder of kidney and ureter, unspecified: Secondary | ICD-10-CM | POA: Insufficient documentation

## 2019-01-24 DIAGNOSIS — Z959 Presence of cardiac and vascular implant and graft, unspecified: Secondary | ICD-10-CM

## 2019-01-24 DIAGNOSIS — Q231 Congenital insufficiency of aortic valve: Secondary | ICD-10-CM | POA: Insufficient documentation

## 2019-01-24 DIAGNOSIS — Z87891 Personal history of nicotine dependence: Secondary | ICD-10-CM | POA: Insufficient documentation

## 2019-01-24 DIAGNOSIS — J9811 Atelectasis: Secondary | ICD-10-CM | POA: Diagnosis not present

## 2019-01-24 DIAGNOSIS — I4821 Permanent atrial fibrillation: Secondary | ICD-10-CM

## 2019-01-24 HISTORY — PX: BIV PACEMAKER INSERTION CRT-P: EP1199

## 2019-01-24 LAB — SURGICAL PCR SCREEN
MRSA, PCR: NEGATIVE
Staphylococcus aureus: NEGATIVE

## 2019-01-24 SURGERY — BIV PACEMAKER INSERTION CRT-P

## 2019-01-24 MED ORDER — LIDOCAINE HCL 1 % IJ SOLN
INTRAMUSCULAR | Status: AC
  Filled 2019-01-24: qty 60

## 2019-01-24 MED ORDER — MUPIROCIN 2 % EX OINT
1.0000 "application " | TOPICAL_OINTMENT | Freq: Once | CUTANEOUS | Status: AC
  Administered 2019-01-24: 08:00:00 1 via TOPICAL
  Filled 2019-01-24: qty 22

## 2019-01-24 MED ORDER — IOHEXOL 350 MG/ML SOLN
INTRAVENOUS | Status: DC | PRN
  Administered 2019-01-24: 11:00:00 10 mL

## 2019-01-24 MED ORDER — SODIUM CHLORIDE 0.9 % IV SOLN
INTRAVENOUS | Status: AC
  Filled 2019-01-24: qty 2

## 2019-01-24 MED ORDER — CEFAZOLIN SODIUM-DEXTROSE 2-4 GM/100ML-% IV SOLN
2.0000 g | INTRAVENOUS | Status: AC
  Administered 2019-01-24: 2 g via INTRAVENOUS
  Filled 2019-01-24: qty 100

## 2019-01-24 MED ORDER — FENTANYL CITRATE (PF) 100 MCG/2ML IJ SOLN
INTRAMUSCULAR | Status: DC | PRN
  Administered 2019-01-24 (×4): 25 ug via INTRAVENOUS

## 2019-01-24 MED ORDER — MIDAZOLAM HCL 5 MG/5ML IJ SOLN
INTRAMUSCULAR | Status: AC
  Filled 2019-01-24: qty 5

## 2019-01-24 MED ORDER — HEPARIN (PORCINE) IN NACL 1000-0.9 UT/500ML-% IV SOLN
INTRAVENOUS | Status: AC
  Filled 2019-01-24: qty 500

## 2019-01-24 MED ORDER — ONDANSETRON HCL 4 MG/2ML IJ SOLN: 4.0000 mg | Freq: Four times a day (QID) | INTRAMUSCULAR | Status: DC | PRN

## 2019-01-24 MED ORDER — LIDOCAINE HCL (PF) 1 % IJ SOLN
INTRAMUSCULAR | Status: DC | PRN
  Administered 2019-01-24: 11:00:00 60 mL

## 2019-01-24 MED ORDER — CHLORHEXIDINE GLUCONATE 4 % EX LIQD
60.0000 mL | Freq: Once | CUTANEOUS | Status: DC
  Filled 2019-01-24: qty 60

## 2019-01-24 MED ORDER — SODIUM CHLORIDE 0.9 % IV SOLN
INTRAVENOUS | Status: DC
  Administered 2019-01-24: 08:00:00 via INTRAVENOUS

## 2019-01-24 MED ORDER — MUPIROCIN 2 % EX OINT
TOPICAL_OINTMENT | CUTANEOUS | Status: AC
  Administered 2019-01-24: 1 via TOPICAL
  Filled 2019-01-24: qty 22

## 2019-01-24 MED ORDER — SODIUM CHLORIDE 0.9 % IV SOLN
80.0000 mg | INTRAVENOUS | Status: AC
  Administered 2019-01-24: 80 mg
  Filled 2019-01-24: qty 2

## 2019-01-24 MED ORDER — ACETAMINOPHEN 325 MG PO TABS: 325.0000 mg | ORAL_TABLET | ORAL | Status: DC | PRN

## 2019-01-24 MED ORDER — FENTANYL CITRATE (PF) 100 MCG/2ML IJ SOLN
INTRAMUSCULAR | Status: AC
  Filled 2019-01-24: qty 2

## 2019-01-24 MED ORDER — CEFAZOLIN SODIUM-DEXTROSE 2-4 GM/100ML-% IV SOLN
INTRAVENOUS | Status: AC
  Filled 2019-01-24: qty 100

## 2019-01-24 MED ORDER — CEFAZOLIN SODIUM-DEXTROSE 1-4 GM/50ML-% IV SOLN: 1.0000 g | Freq: Four times a day (QID) | INTRAVENOUS | Status: DC

## 2019-01-24 MED ORDER — MIDAZOLAM HCL 5 MG/5ML IJ SOLN
INTRAMUSCULAR | Status: DC | PRN
  Administered 2019-01-24 (×4): 1 mg via INTRAVENOUS

## 2019-01-24 SURGICAL SUPPLY — 21 items
BALLN ATTAIN 80 (BALLOONS) ×2
BALLN ATTAIN 80CM 6215 (BALLOONS) ×1
BALLOON ATTAIN 80 (BALLOONS) IMPLANT
CABLE SURGICAL S-101-97-12 (CABLE) ×3 IMPLANT
CATH ATTAIN SEL SURV 6248V-130 (CATHETERS) ×2 IMPLANT
CATH CPS DIRECT 135 DS2C020 (CATHETERS) ×2 IMPLANT
DEVICE CRTP PERCEPTA QUAD MRI (Pacemaker) ×2 IMPLANT
LEAD CAPSURE NOVUS 5076-58CM (Lead) ×2 IMPLANT
LEAD QUARTET 1458QL-86 (Lead) IMPLANT
PACK EP LATEX FREE (CUSTOM PROCEDURE TRAY) ×3
PACK EP LF (CUSTOM PROCEDURE TRAY) ×1 IMPLANT
PAD PRO RADIOLUCENT 2001M-C (PAD) ×3 IMPLANT
PIN PLUG IS-1 DEFIB (PIN) ×2 IMPLANT
QUARTET 1458QL-86 (Lead) ×3 IMPLANT
SHEATH 9.5FR PRELUDE SNAP 13 (SHEATH) ×2 IMPLANT
SHEATH 9FR PRELUDE SNAP 13 (SHEATH) IMPLANT
SHEATH CLASSIC 7F (SHEATH) ×2 IMPLANT
SLITTER UNIVERSAL DS2A003 (MISCELLANEOUS) ×2 IMPLANT
TRAY PACEMAKER INSERTION (PACKS) ×3 IMPLANT
WIRE ACUITY WHISPER EDS 4648 (WIRE) ×2 IMPLANT
WIRE HI TORQ VERSACORE-J 145CM (WIRE) ×2 IMPLANT

## 2019-01-24 NOTE — Progress Notes (Signed)
CXR good LV lead placement   RV lead with some retraction DEvice function normal Pocket without hematoma  Instructions given Questions answered

## 2019-01-24 NOTE — Discharge Instructions (Signed)
Pacemaker Implantation, Adult, Care After This sheet gives you information about how to care for yourself after your procedure. Your health care provider may also give you more specific instructions. If you have problems or questions, contact your health care provider. What can I expect after the procedure? After the procedure, it is common to have:  Mild pain.  Slight bruising.  Some swelling over the incision.  A slight bump over the skin where the device was placed. Sometimes, it is possible to feel the device under the skin. This is normal. Follow these instructions at home: Medicines  Take over-the-counter and prescription medicines only as told by your health care provider.  If you were prescribed an antibiotic medicine, take it as told by your health care provider. Do not stop taking the antibiotic even if you start to feel better. Wound care   Do not remove the bandage on your chest until directed to do so by your health care provider.  After your bandage is removed, you may see pieces of tape called skin adhesive strips over the area where the cut was made (incision site). Let them fall off on their own.  Check the incision site every day to make sure it is not infected, bleeding, or starting to pull apart.  Do not use lotions or ointments near the incision site unless directed to do so.  Keep the incision area clean and dry for 2-3 days after the procedure or as directed by your health care provider. It takes several weeks for the incision site to completely heal.  Do not take baths, swim, or use a hot tub for 7-10 days or as otherwise directed by your health care provider. Activity  Do not drive or use heavy machinery while taking prescription pain medicine.  Do not drive for 24 hours if you were given a medicine to help you relax (sedative).  Check with your health care provider before you start to drive or play sports.  Avoid sudden jerking, pulling, or chopping  movements that pull your upper arm far away from your body. Avoid these movements for at least 6 weeks or as long as told by your health care provider.  Do not lift your upper arm above your shoulders for at least 6 weeks or as long as told by your health care provider. This means no tennis, golf, or swimming.  You may go back to work when your health care provider says it is okay. Pacemaker care  You may be shown how to transfer data from your pacemaker through the phone to your health care provider.  Always let all health care providers know about your pacemaker before you have any medical procedures or tests.  Wear a medical ID bracelet or necklace stating that you have a pacemaker. Carry a pacemaker ID card with you at all times.  Your pacemaker battery will last for 5-15 years. Routine checks by your health care provider will let the health care provider know when the battery is starting to run down. The pacemaker will need to be replaced when the battery starts to run down.  Do not use amateur Chief of Staff. Other electrical devices are safe to use, including power tools, lawn mowers, and speakers. If you are unsure of whether something is safe to use, ask your health care provider.  When using your cell phone, hold it to the ear opposite the pacemaker. Do not leave your cell phone in a pocket over the pacemaker.  Avoid places or objects that have a strong electric or magnetic field, including: °? Airport security gates. When at the airport, let officials know that you have a pacemaker. °? Power plants. °? Large electrical generators. °? Radiofrequency transmission towers, such as cell phone and radio towers. °General instructions °· Weigh yourself every day. If you suddenly gain weight, fluid may be building up in your body. °· Keep all follow-up visits as told by your health care provider. This is important. °Contact a health care provider if: °· You gain  weight suddenly. °· Your legs or feet swell. °· It feels like your heart is fluttering or skipping beats (heart palpitations). °· You have chills or a fever. °· You have more redness, swelling, or pain around your incisions. °· You have more fluid or blood coming from your incisions. °· Your incisions feel warm to the touch. °· You have pus or a bad smell coming from your incisions. °Get help right away if: °· You have chest pain. °· You have trouble breathing or are short of breath. °· You become extremely tired. °· You are light-headed or you faint. °This information is not intended to replace advice given to you by your health care provider. Make sure you discuss any questions you have with your health care provider. °Document Released: 11/29/2004 Document Revised: 04/24/2017 Document Reviewed: 02/22/2016 °Elsevier Patient Education © 2020 Elsevier Inc. ° °

## 2019-01-24 NOTE — Progress Notes (Signed)
Patient made aware of negative PCR

## 2019-01-24 NOTE — H&P (Addendum)
Patient Care Team: [name], MD as PCP - General (Family Medicine) Larey Dresser, MD as PCP - Advanced Heart Failure (Cardiology) Larey Dresser, MD as Consulting Physician (Cardiology) Milus Banister, MD as Attending Physician (Gastroenterology) Jarome Matin, MD as Consulting Physician (Dermatology) Rutherford Guys, MD as Consulting Physician (Ophthalmology) Paralee Cancel, MD as Consulting Physician (Orthopedic Surgery) Joycie Peek, MD (General Practice)   HPI  Travis [name] is a 78 y.o. male Admitted for CRT-P with anticipation of AV ablation  Since last being seen in our clinic for AFIB the patient reports persistent atrial fib assoc with weakness and fatigue  Saw Dr Rayann Heman by telehealth visit  And recommended, following eval by Dr DM to pursue CRT-P and AV Ablation  Edema resolved, no limiting dyspnea or chest pain   Persistent AFib with LBBB- atypical and hx of prior ablation with significant LAE  Device has healed without difficulty  Took anticoagulation last pm   Thromboembolic risk factors ( age  -2, HTN-1, CHF-1) for a CHADSVASc Score of 4  DATE TEST EF   4/18 Echo  55-60% AS mild  3/19 Echo   30.35 %   7/20 Echo   20.25 % AoV bicuspid  No AS  Mod AI         Date Cr K Hgb  8/20 1.29 4.3 14.2          Records and Results Reviewed   Past Medical History:  Diagnosis Date  . Arthritis   . Biatrial enlargement    severe  . Bicuspid aortic valve   . CHF (congestive heart failure) ([place])    JULY 2014  . Chronic renal insufficiency    not aware  . GERD (gastroesophageal reflux disease)   . History of cardioversion 12/16/2012  . History of stomach ulcers   . Hypertension   . Left bundle branch block   . Nonischemic cardiomyopathy ([place])       . Persistent atrial fibrillation    multiple prior cardioversion  . Sinus bradycardia   . Status post clamping of cerebral aneurysm 80    Past Surgical History:  Procedure Laterality Date   . ATRIAL FIBRILLATION ABLATION N/A 10/06/2017   Procedure: ATRIAL FIBRILLATION ABLATION;  Surgeon: [name], MD;  Location: [place] [facility];  Service: Cardiovascular;  Laterality: N/A;  . BIOPSY  02/06/2018   Procedure: BIOPSY;  Surgeon: Lavena Bullion, DO;  Location: [place] ENDOSCOPY;  Service: Gastroenterology;;  . BIOPSY  02/28/2018   Procedure: BIOPSY;  Surgeon: Mauri Pole, MD;  Location: [place];  Service: Endoscopy;;  . CARDIAC CATHETERIZATION     not awaer of this  . CARDIOVERSION N/A 12/16/2012   Procedure: CARDIOVERSION;  Surgeon: Lelon Perla, MD;  Location: [facility] ENDOSCOPY;  Service: Cardiovascular;  Laterality: N/A;  . CARDIOVERSION N/A 08/14/2017   Procedure: CARDIOVERSION;  Surgeon: Josue Hector, MD;  Location: [facility] ENDOSCOPY;  Service: Cardiovascular;  Laterality: N/A;  . CARDIOVERSION N/A 09/21/2017   Procedure: CARDIOVERSION;  Surgeon: Sanda , MD;  Location: [place] ENDOSCOPY;  Service: Cardiovascular;  Laterality: N/A;  . CARDIOVERSION N/A 10/13/2017   Procedure: CARDIOVERSION;  Surgeon: Josue Hector, MD;  Location: [facility] ENDOSCOPY;  Service: Cardiovascular;  Laterality: N/A;  . CARDIOVERSION N/A 07/05/2018   Procedure: CARDIOVERSION;  Surgeon: Larey Dresser, MD;  Location: [facility] ENDOSCOPY;  Service: Cardiovascular;  Laterality: N/A;  . CATARACT EXTRACTION  05/2015   bilateral  . cerebral anuersym post clips    .  ESOPHAGOGASTRODUODENOSCOPY N/A 02/06/2018   Procedure: ESOPHAGOGASTRODUODENOSCOPY (EGD);  Surgeon: Lavena Bullion, DO;  Location: [facility] ENDOSCOPY;  Service: Gastroenterology;  Laterality: N/A;  . ESOPHAGOGASTRODUODENOSCOPY N/A 02/28/2018   Procedure: ESOPHAGOGASTRODUODENOSCOPY (EGD);  Surgeon: Mauri Pole, MD;  Location: [facility] ENDOSCOPY;  Service: Endoscopy;  Laterality: N/A;  . fractured left arm    . HERNIA REPAIR     lft  . INGUINAL HERNIA REPAIR  07/02/2011   Procedure: LAPAROSCOPIC INGUINAL HERNIA;  Surgeon: Harl Bowie, MD;   Location: [place];  Service: General;  Laterality: Left;  Laparoscopic left inguinal hernia repair and mesh  . INGUINAL HERNIA REPAIR Left 11/24/2018   Procedure: OPEN LEFT INGUINAL HERNIA REPAIR WITH MESH;  Surgeon: Coralie Keens, MD;  Location: [place];  Service: General;  Laterality: Left;  TAP BLOCK  . left tendon repair     lft foot  . ROTATOR CUFF REPAIR     lf  . TEE WITHOUT CARDIOVERSION N/A 08/14/2017   Procedure: TRANSESOPHAGEAL ECHOCARDIOGRAM (TEE);  Surgeon: Josue Hector, MD;  Location: [facility] ENDOSCOPY;  Service: Cardiovascular;  Laterality: N/A;  . TEE WITHOUT CARDIOVERSION N/A 07/05/2018   Procedure: TRANSESOPHAGEAL ECHOCARDIOGRAM (TEE);  Surgeon: Larey Dresser, MD;  Location: [facility] ENDOSCOPY;  Service: Cardiovascular;  Laterality: N/A;  . TONSILLECTOMY    . TOTAL KNEE ARTHROPLASTY Bilateral 02/06/2014   Procedure: TOTAL KNEE BILATERAL;  Surgeon: Mauri Pole, MD;  Location: [facility];  Service: Orthopedics;  Laterality: Bilateral;  . WRIST GANGLION EXCISION     lft    Current Facility-Administered Medications  Medication Dose Route Frequency Provider Last Rate Last Dose  . mupirocin ointment (BACTROBAN) 2 %             No Known Allergies    Social History   Tobacco Use  . Smoking status: Former Smoker    Packs/day: 1.50    Years: 25.00    Pack years: 10.50    Quit date: 05/26/1986    Years since quitting: 32.6  . Smokeless tobacco: Never Used  Substance Use Topics  . Alcohol use: Not Currently    Alcohol/week: 1.0 standard drinks    Types: 1 Standard drinks or equivalent per week    Comment: stopped drinking   . Drug use: No     Family History  Problem Relation Age of Onset  . Stroke Father 9       smoker  . Lung cancer Mother 74       former smoker  . Stroke Mother   . Stroke Brother   . Colon cancer Neg Hx      Current Meds  Medication Sig  . acetaminophen (TYLENOL) 500 MG tablet Take 1,000 mg by mouth every 8 (eight) hours as needed for moderate  pain or headache.  Marland Kitchen alum & mag hydroxide-simeth (MAALOX/MYLANTA) 200-200-20 MG/5ML suspension Take 15 mLs by mouth daily.  . carvedilol (COREG) 25 MG tablet Take 1 tablet (25 mg total) by mouth 2 (two) times daily.  . digoxin (LANOXIN) 0.125 MG tablet Take 1 tablet (0.125 mg total) by mouth daily.  . Evolocumab (REPATHA SURECLICK) XX123456 MG/ML SOAJ Inject 140 mg into the skin every 14 (fourteen) days. Every other Wednesday  . finasteride (PROPECIA) 1 MG tablet Take 1 mg by mouth daily.  Marland Kitchen losartan (COZAAR) 25 MG tablet Take 1 tablet (25 mg total) by mouth every evening.  . Multiple Vitamins-Minerals (PRESERVISION AREDS 2 PO) Take 1 tablet by mouth 2 (two) times a day.  Marland Kitchen  pantoprazole (PROTONIX) 40 MG tablet Take 40 mg by mouth 2 (two) times daily.  . Rivaroxaban (XARELTO) 15 MG TABS tablet Take 15 mg by mouth at bedtime.   Marland Kitchen spironolactone (ALDACTONE) 25 MG tablet Take 0.5 tablets (12.5 mg total) by mouth every evening.  . temazepam (RESTORIL) 15 MG capsule Take 15 mg by mouth at bedtime as needed for sleep.  Marland Kitchen torsemide (DEMADEX) 20 MG tablet Take 40 mg by mouth daily.  . traMADol (ULTRAM) 50 MG tablet Take 50 mg by mouth 3 (three) times daily.      Review of Systems negative except from HPI and PMH  Physical Exam BP 139/77   Pulse 77   Temp (!) 97.1 F (36.2 C) (Skin)   Resp 14   Ht 5\' 8"  (1.727 m)   Wt 74.8 kg   SpO2 100%   BMI 25.09 kg/m  Well developed and nourished in no acute distress HENT normal Neck supple with JVP-flat Carotids brisk and full without bruits Clear Irregularly irregular rate and rhythm with controlled ventricular response, no murmurs or gallops Abd-soft with active BS without hepatomegaly No Clubbing cyanosis edema Skin-warm and dry A & Oriented  Grossly normal sensory and motor function     Assessment and  Plan    8.6.20 ECG Personally reviewed   Afib @ 77 LBBB atypical , qR in lead 1 and L and R/S in V6    ASSESSMENT & PLAN:    Atrial  fibrillation- persistent  Cardiomyopathy-tachycardia mediated-recurrent   Bicuspid aortic valve-    Hypertension   CHF chronic systolic  Renal insufficiency  Left bundle branch block atypical      Virl Axe, MD  01/24/2019 7:49 AM     [facility] [address] [place] [address] [phone] (office) [phone] (fax)   ADMITTED TODAY for part 2, ie AV ablation following CRT=P implant 4 weeks ago  Functional status stable  Discussed risk and benefits with pt  Agreeable to proceeding

## 2019-01-25 ENCOUNTER — Encounter (HOSPITAL_COMMUNITY): Payer: Self-pay | Admitting: Internal Medicine

## 2019-01-25 ENCOUNTER — Other Ambulatory Visit: Payer: Self-pay | Admitting: Gastroenterology

## 2019-01-25 ENCOUNTER — Encounter (HOSPITAL_COMMUNITY): Payer: Medicare Other | Admitting: Cardiology

## 2019-01-25 MED FILL — Heparin Sod (Porcine)-NaCl IV Soln 1000 Unit/500ML-0.9%: INTRAVENOUS | Qty: 500 | Status: AC

## 2019-01-25 MED FILL — Lidocaine HCl Local Inj 1%: INTRAMUSCULAR | Qty: 60 | Status: AC

## 2019-01-27 ENCOUNTER — Telehealth: Payer: Self-pay

## 2019-01-27 NOTE — Telephone Encounter (Signed)
Prior Auth Started for Temazepam 15 Mg Qty. [place]: [id number]  Need help? Call us at [phone]    Status  Sent to [employer]   Drug Temazepam 15MG  capsules Form [employer] Building control surveyor Form (CB

## 2019-01-28 NOTE — Telephone Encounter (Signed)
Prior Josem Kaufmann has been cancelled. Prior Auth PPW will be sent to the office. Will fill out once obtained.

## 2019-01-30 NOTE — Progress Notes (Addendum)
Cardiology Office Note Date:  02/01/2019  Patient ID:  Travis [name] 1940/06/04, MRN [id number] PCP:  [name], MD  Cardiologist:  [name] Electrophysiologist: [name]    Chief Complaint:  wound check  History of Present Illness: Travis [name] is a 78 y.o. male with history of recurrent NICM (BiV) in setting of AFib (felt to be 2/2 tachy/afib), HTN, HLD, CKD (III), LBBB, CAD by CT Ca++, is now s/p PPM with plans to pursue AV node ablation.    There is mentioned h/o bicuspid AV, though TEE Feb 2020 noted trileaflet   He comes in today feeling well.  No pain or worries about his implant site, feels like he is healing up well.  No CP or SOB, no dizziness, near syncope or syncope. He looks forward to his AV node ablation and getting that behind him.  He states he and [name] have discussed it.   He is back on his xarelto, denies any bleeding/signs of bleeding.  Afib Hx A number of failed DCCV Failed Tikosyn  AFib Ablation w/EWRAF Amiodarone started may 2019 >> DCCV 01/2018 admitted with symptomatic anemia and UGIB  (ulcer likely 2/2 NSAID) Feb 2020 AFib > TEE/DCCV March back in Afib, evaluated by [name], no plans to pursue repeat PVI ablation >>> Permanent AFib >> 01/24/2019 CRT-P implanted with plans for AV node ablation    Device information SJM dual chamber PPM (RV/LV leads) implanted 01/24/2019    Past Medical History:  Diagnosis Date  . Arthritis   . Biatrial enlargement    severe  . Bicuspid aortic valve   . CHF (congestive heart failure) ([place])    JULY 2014  . Chronic renal insufficiency    not aware  . GERD (gastroesophageal reflux disease)   . History of cardioversion 12/16/2012  . History of stomach ulcers   . Hypertension   . Left bundle branch block   . Nonischemic cardiomyopathy ([place])       . Persistent atrial fibrillation    multiple prior cardioversion  . Sinus bradycardia   . Status post clamping of cerebral aneurysm 80    Past Surgical History:  Procedure Laterality Date  . ATRIAL FIBRILLATION ABLATION N/A 10/06/2017   Procedure: ATRIAL FIBRILLATION ABLATION;  Surgeon: [name], MD;  Location: [place] CV LAB;  Service: Cardiovascular;  Laterality: N/A;  . BIOPSY  02/06/2018   Procedure: BIOPSY;  Surgeon: Lavena Bullion, DO;  Location: [place] [facility];  Service: Gastroenterology;;  . BIOPSY  02/28/2018   Procedure: BIOPSY;  Surgeon: Mauri Pole, MD;  Location: [place] ENDOSCOPY;  Service: Endoscopy;;  . BIV PACEMAKER INSERTION CRT-P N/A 01/24/2019   Procedure: BIV PACEMAKER INSERTION CRT-P;  Surgeon: Deboraha Sprang, MD;  Location: [place] [facility] LAB;  Service: Cardiovascular;  Laterality: N/A;  . CARDIAC CATHETERIZATION     not awaer of this  . CARDIOVERSION N/A 12/16/2012   Procedure: CARDIOVERSION;  Surgeon: Lelon Perla, MD;  Location: [facility] ENDOSCOPY;  Service: Cardiovascular;  Laterality: N/A;  . CARDIOVERSION N/A 08/14/2017   Procedure: CARDIOVERSION;  Surgeon: Josue Hector, MD;  Location: [facility] ENDOSCOPY;  Service: Cardiovascular;  Laterality: N/A;  . CARDIOVERSION N/A 09/21/2017   Procedure: CARDIOVERSION;  Surgeon: Sanda Klein, MD;  Location: [place] ENDOSCOPY;  Service: Cardiovascular;  Laterality: N/A;  . CARDIOVERSION N/A 10/13/2017   Procedure: CARDIOVERSION;  Surgeon: Josue Hector, MD;  Location: [facility] ENDOSCOPY;  Service: Cardiovascular;  Laterality: N/A;  . CARDIOVERSION N/A 07/05/2018  Procedure: CARDIOVERSION;  Surgeon: Larey Dresser, MD;  Location: [facility] ENDOSCOPY;  Service: Cardiovascular;  Laterality: N/A;  . CATARACT EXTRACTION  05/2015   bilateral  . cerebral anuersym post clips    . ESOPHAGOGASTRODUODENOSCOPY N/A 02/06/2018   Procedure: ESOPHAGOGASTRODUODENOSCOPY (EGD);  Surgeon: Lavena Bullion, DO;  Location: [facility] ENDOSCOPY;  Service: Gastroenterology;  Laterality: N/A;  . ESOPHAGOGASTRODUODENOSCOPY N/A 02/28/2018   Procedure: ESOPHAGOGASTRODUODENOSCOPY (EGD);  Surgeon:  Mauri Pole, MD;  Location: [facility] ENDOSCOPY;  Service: Endoscopy;  Laterality: N/A;  . fractured left arm    . HERNIA REPAIR     lft  . INGUINAL HERNIA REPAIR  07/02/2011   Procedure: LAPAROSCOPIC INGUINAL HERNIA;  Surgeon: Harl Bowie, MD;  Location: [place];  Service: General;  Laterality: Left;  Laparoscopic left inguinal hernia repair and mesh  . INGUINAL HERNIA REPAIR Left 11/24/2018   Procedure: OPEN LEFT INGUINAL HERNIA REPAIR WITH MESH;  Surgeon: Coralie Keens, MD;  Location: [place];  Service: General;  Laterality: Left;  TAP BLOCK  . left tendon repair     lft foot  . ROTATOR CUFF REPAIR     lf  . TEE WITHOUT CARDIOVERSION N/A 08/14/2017   Procedure: TRANSESOPHAGEAL ECHOCARDIOGRAM (TEE);  Surgeon: Josue Hector, MD;  Location: [facility] ENDOSCOPY;  Service: Cardiovascular;  Laterality: N/A;  . TEE WITHOUT CARDIOVERSION N/A 07/05/2018   Procedure: TRANSESOPHAGEAL ECHOCARDIOGRAM (TEE);  Surgeon: Larey Dresser, MD;  Location: [facility] ENDOSCOPY;  Service: Cardiovascular;  Laterality: N/A;  . TONSILLECTOMY    . TOTAL KNEE ARTHROPLASTY Bilateral 02/06/2014   Procedure: TOTAL KNEE BILATERAL;  Surgeon: Mauri Pole, MD;  Location: [facility] ORS;  Service: Orthopedics;  Laterality: Bilateral;  . WRIST GANGLION EXCISION     lft    Current Outpatient Medications  Medication Sig Dispense Refill  . acetaminophen (TYLENOL) 500 MG tablet Take 1,000 mg by mouth every 8 (eight) hours as needed for moderate pain or headache.    Marland Kitchen alum & mag hydroxide-simeth (MAALOX/MYLANTA) 200-200-20 MG/5ML suspension Take 15 mLs by mouth daily.    . carvedilol (COREG) 25 MG tablet Take 1 tablet (25 mg total) by mouth 2 (two) times daily. 180 tablet 3  . digoxin (LANOXIN) 0.125 MG tablet Take 1 tablet (0.125 mg total) by mouth daily. 90 tablet 3  . Evolocumab (REPATHA SURECLICK) XX123456 MG/ML SOAJ Inject 140 mg into the skin every 14 (fourteen) days. Every other Wednesday    . finasteride (PROPECIA) 1 MG tablet Take 1 mg  by mouth daily.    Marland Kitchen losartan (COZAAR) 25 MG tablet Take 1 tablet (25 mg total) by mouth every evening. 90 tablet 3  . Multiple Vitamins-Minerals (PRESERVISION AREDS 2 PO) Take 1 tablet by mouth 2 (two) times a day.    . pantoprazole (PROTONIX) 40 MG tablet TAKE 1 TABLET(40 MG) BY MOUTH TWICE DAILY 60 tablet 3  . Rivaroxaban (XARELTO) 15 MG TABS tablet Take 15 mg by mouth at bedtime.     Marland Kitchen spironolactone (ALDACTONE) 25 MG tablet Take 0.5 tablets (12.5 mg total) by mouth every evening. 45 tablet 3  . temazepam (RESTORIL) 15 MG capsule Take 15 mg by mouth at bedtime as needed for sleep.    Marland Kitchen torsemide (DEMADEX) 20 MG tablet Take 40 mg by mouth daily.    . traMADol (ULTRAM) 50 MG tablet Take 50 mg by mouth 3 (three) times daily.      No current facility-administered medications for this visit.     Allergies:   Patient  has no known allergies.   Social History:  The patient  reports that he quit smoking about 32 years ago. He has a 37.50 pack-year smoking history. He has never used smokeless tobacco. He reports previous alcohol use of about 1.0 standard drinks of alcohol per week. He reports that he does not use drugs.   Family History:  The patient's family history includes Lung cancer (age of onset: 104) in his mother; Stroke in his brother and mother; Stroke (age of onset: 69) in his father.  ROS:  Please see the history of present illness.    All other systems are reviewed and otherwise negative.   PHYSICAL EXAM:  VS:  BP 131/83   Pulse 86   Ht 5\' 8"  (1.727 m)   Wt 164 lb (74.4 kg)   BMI 24.94 kg/m  BMI: Body mass index is 24.94 kg/m. Well nourished, well developed, in no acute distress  HEENT: normocephalic, atraumatic  Neck: no JVD, carotid bruits or masses Cardiac:  RRR; (paced)no significant murmurs, no rubs, or gallops Lungs:  CTA b/l, no wheezing, rhonchi or rales  Abd: soft, nontender MS: no deformity or atrophy Ext: no edema  Skin: warm and dry, no rash Neuro:  No gross  deficits appreciated Psych: euthymic mood, full affect  PPM site looks good, Dermabond remains, incision is well healed, no swelling, bleeding, drainage, no fluctuation, erythema or increased heat to the surrounding tissue  Some dermabond was removed, remains over the incision itself, did not come easily (given only 8 days) did not pursue removal aggressively.  Instructed the patient if it doesn't fall off in the next week or two to let us know or he can use the adhesive remover to gently remove the remaining if comfortable doing so.  He said he would.  EKG:  Not done today PPM interrogation done today and reviewed by myself: battery and lead measurements are good.  Acute implant settings remain V sensed events are few and breif, only noted yesterday, 88.8% effective Bive pacing    12/14/2018  IMPRESSIONS  1. The left ventricle has severely reduced systolic function, with an ejection fraction of 20-25%. The cavity size was mildly dilated. Left ventricular diastolic Doppler parameters are consistent with restrictive filling. Elevated mean left atrial  pressure.  2. The right ventricle has moderately reduced systolic function. The cavity was normal. There is no increase in right ventricular wall thickness. Right ventricular systolic pressure is moderately elevated with an estimated pressure of 59.9 mmHg.  3. Left atrial size was moderately dilated.  4. Right atrial size was severely dilated.  5. The mitral valve is degenerative. Mild thickening of the mitral valve leaflet. There is mild mitral annular calcification present.  6. Tricuspid valve regurgitation is moderate-severe.  7. The aortic valve is bicuspid. Severely thickening of the aortic valve. Severe calcifcation of the aortic valve. Aortic valve regurgitation is moderate by color flow Doppler. No stenosis of the aortic valve.  8. Morphologically, the degree of aortic stenosis appears worse than Doppler measurements (Peak velocity 1.64m/s,  mean gradient 69mmHg).     Demensionless index is 0.62.  9. The inferior vena cava was dilated in size with <50% respiratory variability. 10. The average left ventricular global longitudinal strain is -4.2 %.      Recent Labs: 12/06/2018: ALT 15 12/21/2018: TSH 4.279 01/21/2019: BUN 26; Creatinine, Ser 1.26; Hemoglobin 14.5; Platelets 208; Potassium 4.9; Sodium 134  09/14/2018: VLDL 15 11/08/2018: Chol/HDL Ratio 1.8; Cholesterol, Total 118; HDL 64; LDL  Calculated 38; Triglycerides 81   Estimated Creatinine Clearance: 46.7 mL/min (by C-G formula based on SCr of 1.26 mg/dL).   Wt Readings from Last 3 Encounters:  02/01/19 164 lb (74.4 kg)  01/24/19 165 lb (74.8 kg)  01/03/19 164 lb (74.4 kg)     Other studies reviewed: Additional studies/records reviewed today include: summarized above  ASSESSMENT AND PLAN:  1. PPM      Stable implant site     Remaining activity restrictions reviewed, states he has a bad shoulder on the left and can not do much with it as far as ROM/work      acute implant outputs remain, no programming changes made   2. Permanent Afib     CHA2DS2Vasc is 4, on xarelto  Xarelto is dosed at 15mg  daily His last Creat 1.26 is better then what his baseline appears.  Generally looks more towards 1.4, putting his clearance in 40's  D/w [name], plan for AV node ablation in 2-3 weeks. Pt reported he has had a number of conversations about the procedure with [name], looks forward to getting it done finally.    3. NICM     s/p CRT-P     No symptoms or exam findings of fluid OL     Follows with [name]    Disposition: F/u with routine post AV node ablation follow up, 2 weeks post ablation device clinic, 4 weeks with Dr.klein, sooner if needed.  Current medicines are reviewed at length with the patient today.  The patient did not have any concerns regarding medicines.  Venetia Night, PA-C 02/01/2019 5:00 PM     [address] [place] [address] [phone] (office)  [phone] (fax)

## 2019-02-01 ENCOUNTER — Ambulatory Visit (INDEPENDENT_AMBULATORY_CARE_PROVIDER_SITE_OTHER): Payer: Medicare Other | Admitting: Physician Assistant

## 2019-02-01 ENCOUNTER — Other Ambulatory Visit: Payer: Self-pay

## 2019-02-01 ENCOUNTER — Encounter: Payer: Self-pay | Admitting: *Deleted

## 2019-02-01 ENCOUNTER — Encounter: Payer: Self-pay | Admitting: Physician Assistant

## 2019-02-01 VITALS — BP 131/83 | HR 86 | Ht 68.0 in | Wt 164.0 lb

## 2019-02-01 DIAGNOSIS — I6523 Occlusion and stenosis of bilateral carotid arteries: Secondary | ICD-10-CM | POA: Diagnosis not present

## 2019-02-01 DIAGNOSIS — I4821 Permanent atrial fibrillation: Secondary | ICD-10-CM | POA: Diagnosis not present

## 2019-02-01 DIAGNOSIS — I5022 Chronic systolic (congestive) heart failure: Secondary | ICD-10-CM

## 2019-02-01 DIAGNOSIS — I420 Dilated cardiomyopathy: Secondary | ICD-10-CM

## 2019-02-01 DIAGNOSIS — Z950 Presence of cardiac pacemaker: Secondary | ICD-10-CM

## 2019-02-01 NOTE — Patient Instructions (Addendum)
Medication Instructions:  Your physician recommends that you continue on your current medications as directed. Please refer to the Current Medication list given to you today.  If you need a refill on your cardiac medications before your next appointment, please call your pharmacy.   Lab work:  NONE ORDERED  TODAY   If you have labs (blood work) drawn today and your tests are completely normal, you will receive your results only by: Marland Kitchen MyChart Message (if you have MyChart) OR . A paper copy in the mail If you have any lab test that is abnormal or we need to change your treatment, we will call you to review the results.  Testing/Procedures:  SEE LETTER FOR AV NODE ABLATION ON 02-18-19     Follow-Up:  AFTER 02-18-19:  2 WEEKS POST DEVICE CLINIC  4 WEEKS POST  ABLATION  KLEIN   Any Other Special Instructions Will Be Listed Below (If Applicable).

## 2019-02-07 ENCOUNTER — Ambulatory Visit (INDEPENDENT_AMBULATORY_CARE_PROVIDER_SITE_OTHER): Payer: Medicare Other

## 2019-02-07 ENCOUNTER — Other Ambulatory Visit: Payer: Self-pay

## 2019-02-07 VITALS — BP 126/70 | Temp 97.5°F | Ht 68.0 in | Wt 167.0 lb

## 2019-02-07 DIAGNOSIS — Z0000 Encounter for general adult medical examination without abnormal findings: Secondary | ICD-10-CM | POA: Diagnosis not present

## 2019-02-07 DIAGNOSIS — Z23 Encounter for immunization: Secondary | ICD-10-CM

## 2019-02-07 NOTE — Progress Notes (Signed)
Subjective:   Travis [name] is a 78 y.o. male who presents for Medicare Annual/Subsequent preventive examination.  Review of Systems:   Cardiac Risk Factors include: advanced age (>43men, >75 women);male gender     Objective:    Vitals: BP 126/70 (BP Location: Left Arm, Patient Position: Sitting, Cuff Size: Normal)   Temp (!) 97.5 F (36.4 C) (Temporal)   Ht 5\' 8"  (1.727 m)   Wt 167 lb (75.8 kg)   BMI 25.39 kg/m   Body mass index is 25.39 kg/m.  Advanced Directives 02/07/2019 01/24/2019 11/22/2018 07/05/2018 06/22/2018 02/28/2018 02/26/2018  Does Patient Have a Medical Advance Directive? Yes Yes Yes Yes Yes Yes Yes  Type of Industrial/product designer of [employer] Power of [place];Living will [place];Living will [place];Living will Healthcare Power of [place]  Does patient want to make changes to medical advance directive? No - Patient declined No - Patient declined No - Patient declined - No - Patient declined - No - Patient declined  Copy of [place] in Chart? Yes - validated most recent copy scanned in chart (See row information) Yes - validated most recent copy scanned in chart (See row information) Yes - validated most recent copy scanned in chart (See row information) - Yes - validated most recent copy scanned in chart (See row information) - -  Would patient like information on creating a medical advance directive? - - - - - - -  Pre-existing out of facility DNR order (yellow form or pink MOST form) - - - - - - -    Tobacco Social History   Tobacco Use  Smoking Status Former Smoker  . Packs/day: 1.50  . Years: 25.00  . Pack years: 37.50  . Quit date: 05/26/1986  . Years since quitting: 32.7  Smokeless Tobacco Never Used     Counseling given: Not Answered   Clinical Intake:  Pre-visit preparation completed: No  Pain : No/denies  pain  Diabetes: No  How often do you need to have someone help you when you read instructions, pamphlets, or other written materials from your doctor or pharmacy?: 1 - Never  Interpreter Needed?: No  Information entered by :: Denman George LPN  Past Medical History:  Diagnosis Date  . Arthritis   . Biatrial enlargement    severe  . Bicuspid aortic valve   . CHF (congestive heart failure) ([place])    JULY 2014  . Chronic renal insufficiency    not aware  . GERD (gastroesophageal reflux disease)   . History of cardioversion 12/16/2012  . History of stomach ulcers   . Hypertension   . Left bundle branch block   . Nonischemic cardiomyopathy ([place])       . Persistent atrial fibrillation    multiple prior cardioversion  . Sinus bradycardia   . Status post clamping of cerebral aneurysm 79   Past Surgical History:  Procedure Laterality Date  . ATRIAL FIBRILLATION ABLATION N/A 10/06/2017   Procedure: ATRIAL FIBRILLATION ABLATION;  Surgeon: [name], MD;  Location: [place] [facility];  Service: Cardiovascular;  Laterality: N/A;  . BIOPSY  02/06/2018   Procedure: BIOPSY;  Surgeon: Lavena Bullion, DO;  Location: [place] [facility];  Service: Gastroenterology;;  . BIOPSY  02/28/2018   Procedure: BIOPSY;  Surgeon: Mauri Pole, MD;  Location: [place];  Service: Endoscopy;;  . BIV PACEMAKER INSERTION CRT-P N/A 01/24/2019  Procedure: BIV PACEMAKER INSERTION CRT-P;  Surgeon: Deboraha Sprang, MD;  Location: [place] [facility];  Service: Cardiovascular;  Laterality: N/A;  . CARDIAC CATHETERIZATION     not awaer of this  . CARDIOVERSION N/A 12/16/2012   Procedure: CARDIOVERSION;  Surgeon: Lelon Perla, MD;  Location: [facility] ENDOSCOPY;  Service: Cardiovascular;  Laterality: N/A;  . CARDIOVERSION N/A 08/14/2017   Procedure: CARDIOVERSION;  Surgeon: Josue Hector, MD;  Location: [facility] ENDOSCOPY;  Service: Cardiovascular;  Laterality: N/A;  . CARDIOVERSION N/A 09/21/2017   Procedure:  CARDIOVERSION;  Surgeon: Sanda Klein, MD;  Location: [place] ENDOSCOPY;  Service: Cardiovascular;  Laterality: N/A;  . CARDIOVERSION N/A 10/13/2017   Procedure: CARDIOVERSION;  Surgeon: Josue Hector, MD;  Location: [facility] ENDOSCOPY;  Service: Cardiovascular;  Laterality: N/A;  . CARDIOVERSION N/A 07/05/2018   Procedure: CARDIOVERSION;  Surgeon: Larey Dresser, MD;  Location: [facility] ENDOSCOPY;  Service: Cardiovascular;  Laterality: N/A;  . CATARACT EXTRACTION  05/2015   bilateral  . cerebral anuersym post clips    . ESOPHAGOGASTRODUODENOSCOPY N/A 02/06/2018   Procedure: ESOPHAGOGASTRODUODENOSCOPY (EGD);  Surgeon: Lavena Bullion, DO;  Location: [facility] ENDOSCOPY;  Service: Gastroenterology;  Laterality: N/A;  . ESOPHAGOGASTRODUODENOSCOPY N/A 02/28/2018   Procedure: ESOPHAGOGASTRODUODENOSCOPY (EGD);  Surgeon: Mauri Pole, MD;  Location: [facility] ENDOSCOPY;  Service: Endoscopy;  Laterality: N/A;  . fractured left arm    . HERNIA REPAIR     lft  . INGUINAL HERNIA REPAIR  07/02/2011   Procedure: LAPAROSCOPIC INGUINAL HERNIA;  Surgeon: Harl Bowie, MD;  Location: [place];  Service: General;  Laterality: Left;  Laparoscopic left inguinal hernia repair and mesh  . INGUINAL HERNIA REPAIR Left 11/24/2018   Procedure: OPEN LEFT INGUINAL HERNIA REPAIR WITH MESH;  Surgeon: Coralie Keens, MD;  Location: [place];  Service: General;  Laterality: Left;  TAP BLOCK  . left tendon repair     lft foot  . ROTATOR CUFF REPAIR     lf  . TEE WITHOUT CARDIOVERSION N/A 08/14/2017   Procedure: TRANSESOPHAGEAL ECHOCARDIOGRAM (TEE);  Surgeon: Josue Hector, MD;  Location: [facility] ENDOSCOPY;  Service: Cardiovascular;  Laterality: N/A;  . TEE WITHOUT CARDIOVERSION N/A 07/05/2018   Procedure: TRANSESOPHAGEAL ECHOCARDIOGRAM (TEE);  Surgeon: Larey Dresser, MD;  Location: [facility] ENDOSCOPY;  Service: Cardiovascular;  Laterality: N/A;  . TONSILLECTOMY    . TOTAL KNEE ARTHROPLASTY Bilateral 02/06/2014   Procedure: TOTAL KNEE BILATERAL;   Surgeon: Mauri Pole, MD;  Location: [facility];  Service: Orthopedics;  Laterality: Bilateral;  . WRIST GANGLION EXCISION     lft   Family History  Problem Relation Age of Onset  . Stroke Father 14       smoker  . Lung cancer Mother 12       former smoker  . Stroke Mother   . Stroke Brother   . Colon cancer Neg Hx    Social History   Socioeconomic History  . Marital status: Single    Spouse name: Not on file  . Number of children: 0  . Years of education: Not on file  . Highest education level: Not on file  Occupational History  . Occupation: Retired  Scientific laboratory technician  . Financial resource strain: Not on file  . Food insecurity    Worry: Not on file    Inability: Not on file  . Transportation needs    Medical: Not on file    Non-medical: Not on file  Tobacco Use  . Smoking status: Former Smoker  Packs/day: 1.50    Years: 25.00    Pack years: 35.50    Quit date: 05/26/1986    Years since quitting: 32.7  . Smokeless tobacco: Never Used  Substance and Sexual Activity  . Alcohol use: Not Currently    Alcohol/week: 1.0 standard drinks    Types: 1 Standard drinks or equivalent per week    Comment: stopped drinking   . Drug use: No  . Sexual activity: Not Currently  Lifestyle  . Physical activity    Days per week: Not on file    Minutes per session: Not on file  . Stress: Not on file  Relationships  . Social Herbalist on phone: Not on file    Gets together: Not on file    Attends religious service: Not on file    Active member of club or organization: Not on file    Attends meetings of clubs or organizations: Not on file    Relationship status: Not on file  Other Topics Concern  . Not on file  Social History Narrative   Homosexual. Lives alone. Not sexually active.       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 acetaminophen (TYLENOL) 500 MG tablet Take 1,000 mg by mouth every 8 (eight) hours as needed for moderate pain or headache.  Marland Kitchen alum & mag hydroxide-simeth (MAALOX/MYLANTA) 200-200-20 MG/5ML suspension Take 15 mLs by mouth daily.  . carvedilol (COREG) 25 MG tablet Take 1 tablet (25 mg total) by mouth 2 (two) times daily.  . digoxin (LANOXIN) 0.125 MG tablet Take 1 tablet (0.125 mg total) by mouth daily.  . Evolocumab (REPATHA SURECLICK) XX123456 MG/ML SOAJ Inject 140 mg into the skin every 14 (fourteen) days. Every other Wednesday  . finasteride (PROPECIA) 1 MG tablet Take 1 mg by mouth daily.  Marland Kitchen losartan (COZAAR) 25 MG tablet Take 1 tablet (25 mg total) by mouth every evening.  . Multiple Vitamins-Minerals (PRESERVISION AREDS 2 PO) Take 1 tablet by mouth 2 (two) times a day.  . pantoprazole (PROTONIX) 40 MG tablet TAKE 1 TABLET(40 MG) BY MOUTH TWICE DAILY  . Rivaroxaban (XARELTO) 15 MG TABS tablet Take 15 mg by mouth at bedtime.   Marland Kitchen spironolactone (ALDACTONE) 25 MG tablet Take 0.5 tablets (12.5 mg total) by mouth every evening.  . temazepam (RESTORIL) 15 MG capsule Take 15 mg by mouth at bedtime as needed for sleep.  Marland Kitchen torsemide (DEMADEX) 20 MG tablet Take 40 mg by mouth daily.  . traMADol (ULTRAM) 50 MG tablet Take 50 mg by mouth 3 (three) times daily.    No facility-administered encounter medications on file as of 02/07/2019.     Activities of Daily Living In your present state of health, do you have any difficulty performing the following activities: 02/07/2019 11/22/2018  Hearing? N N  Vision? N N  Difficulty concentrating or making decisions? N N  Walking or climbing stairs? N N  Dressing or bathing? N N  Doing errands, shopping? N N  Preparing Food and eating ? N -  Using the Toilet? N -  In the past six months, have you accidently leaked urine? N -  Do you have problems with loss of bowel control? N -  Managing your Medications? N -  Managing your  Finances? N -  Housekeeping or managing your Housekeeping? N -  Some recent data might be hidden    Patient Care Team: [name], MD as PCP - General (Family Medicine) Larey Dresser, MD as PCP - Advanced Heart Failure (Cardiology) Larey Dresser, MD as Consulting Physician (Cardiology) Milus Banister, MD as Attending Physician (Gastroenterology) Jarome Matin, MD as Consulting Physician (Dermatology) Rutherford Guys, MD as Consulting Physician (Ophthalmology) Paralee Cancel, MD as Consulting Physician (Orthopedic Surgery) Joycie Peek, MD (General Practice)   Assessment:   This is a routine wellness examination for Travis [name].  Exercise Activities and Dietary recommendations Current Exercise Habits: Home exercise routine, Type of exercise: walking;calisthenics, Time (Minutes): 30, Frequency (Times/Week): 3, Weekly Exercise (Minutes/Week): 90, Intensity: Mild, Exercise limited by: cardiac condition(s)  Goals    . patient     Will maintain his health         Fall Risk Fall Risk  02/07/2019 02/04/2018 02/03/2017 02/01/2016 01/29/2016  Falls in the past year? 0 No No No No  Comment - - - - [place] Telephone Survey: data to providers prior to load  Number falls in past yr: 0 - - - -  Injury with Fall? 0 - - - -  Follow up Falls evaluation completed;Education provided - - - -   Is the patient's home free of loose throw rugs in walkways, pet beds, electrical cords, etc?   yes      Grab bars in the bathroom? yes      Handrails on the stairs?   yes      Adequate lighting?   yes  Timed Get Up and Go Performed: completed and within normal timeframe; no gait abnormalities noted    Depression Screen PHQ 2/9 Scores 02/07/2019 02/04/2018 09/29/2017 02/03/2017  PHQ - 2 Score 0 0 1 0  PHQ- 9 Score - - 4 -    Cognitive Function-no cognitive concerns at this time  MMSE - Mini Mental State Exam 02/04/2018  Not completed: (No Data)     6CIT Screen 02/07/2019  What Year? 0 points  What  month? 0 points  What time? 0 points  Count back from 20 0 points  Months in reverse 0 points  Repeat phrase 0 points  Total Score 0    Immunization History  Administered Date(s) Administered  . Fluad Quad(high Dose 65+) 02/07/2019  . Influenza Split 03/04/2011, 02/04/2012  . Influenza Whole 04/17/2004, 03/31/2007, 02/17/2008, 04/12/2009, 03/13/2010  . Influenza, High Dose Seasonal PF 03/17/2016, 02/03/2017, 02/04/2018  . Influenza,inj,Quad PF,6+ Mos 02/09/2013, 03/06/2014, 01/30/2015  . Pneumococcal Conjugate-13 12/19/2014  . Pneumococcal Polysaccharide-23 04/12/2009  . Td 04/17/2004  . Zoster 09/25/2010  . Zoster Recombinat (Shingrix) 09/22/2016, 11/22/2016    Qualifies for Shingles Vaccine? Completed   Screening Tests Health Maintenance  Topic Date Due  . TETANUS/TDAP  04/17/2014  . INFLUENZA VACCINE  Completed  . PNA vac Low Risk Adult  Completed   Cancer Screenings: Lung: Low Dose CT Chest recommended if Age 27-80 years, 30 pack-year currently smoking OR have quit w/in 15years. Patient does not qualify. Colorectal: not indicated       Plan:    I have personally reviewed and addressed the Medicare Annual Wellness questionnaire and have noted the following in the patient's chart:  A. Medical and social history B. Use of alcohol, tobacco or illicit drugs  C. Current medications and supplements D. Functional ability and status E.  Nutritional status F.  Physical activity G. Advance directives H. List of other physicians  I.  Hospitalizations, surgeries, and ER visits in previous 12 months J.  [place] such as hearing and vision if needed, cognitive and depression L. Referrals, records requested, and appointments- none   In addition, I have reviewed and discussed with patient certain preventive protocols, quality metrics, and best practice recommendations. A written personalized care plan for preventive services as well as general preventive health  recommendations were provided to patient.   Signed,  Denman George, LPN  Nurse Health Advisor   Nurse Notes: none

## 2019-02-07 NOTE — Patient Instructions (Signed)
[name] , Thank you for taking time to come for your Medicare Wellness Visit. I appreciate your ongoing commitment to your health goals. Please review the following plan we discussed and let me know if I can assist you in the future.   Screening recommendations/referrals: Colorectal Screening: not indicated   Vision and Dental Exams: Recommended annual ophthalmology exams for early detection of glaucoma and other disorders of the eye Recommended annual dental exams for proper oral hygiene  Vaccinations: Influenza vaccine: today  Pneumococcal vaccine: up to date; last 12/19/14 Tdap vaccine: Please call your insurance company to determine your out of pocket expense. You may also receive this vaccine at your local pharmacy or Health Dept. Shingles vaccine: completed   Advanced directives: Please bring a copy of your POA (Power of Attorney) and/or Living Will to your next appointment.  Goals: Recommend to drink at least 6-8 8oz glasses of water per day.  Next appointment: Please schedule your Annual Wellness Visit with your Nurse Health Advisor in one year.  Best wishes with your upcoming procedure!     Preventive Care 30 Years and Older, Male Preventive care refers to lifestyle choices and visits with your health care provider that can promote health and wellness. What does preventive care include?  A yearly physical exam. This is also called an annual well check.  Dental exams once or twice a year.  Routine eye exams. Ask your health care provider how often you should have your eyes checked.  Personal lifestyle choices, including:  Daily care of your teeth and gums.  Regular physical activity.  Eating a healthy diet.  Avoiding tobacco and drug use.  Limiting alcohol use.  Practicing safe sex.  Taking low doses of aspirin every day if recommended by your health care provider..  Taking vitamin and mineral supplements as recommended by your health care provider. What  happens during an annual well check? The services and screenings done by your health care provider during your annual well check will depend on your age, overall health, lifestyle risk factors, and family history of disease. Counseling  Your health care provider may ask you questions about your:  Alcohol use.  Tobacco use.  Drug use.  Emotional well-being.  Home and relationship well-being.  Sexual activity.  Eating habits.  History of falls.  Memory and ability to understand (cognition).  Work and work Statistician. Screening  You may have the following tests or measurements:  Height, weight, and BMI.  Blood pressure.  Lipid and cholesterol levels. These may be checked every 5 years, or more frequently if you are over 57 years old.  Skin check.  Lung cancer screening. You may have this screening every year starting at age 25 if you have a 30-pack-year history of smoking and currently smoke or have quit within the past 15 years.  Fecal occult blood test (FOBT) of the stool. You may have this test every year starting at age 38.  Flexible sigmoidoscopy or colonoscopy. You may have a sigmoidoscopy every 5 years or a colonoscopy every 10 years starting at age 16.  Prostate cancer screening. Recommendations will vary depending on your family history and other risks.  Hepatitis C blood test.  Hepatitis B blood test.  Sexually transmitted disease (STD) testing.  Diabetes screening. This is done by checking your blood sugar (glucose) after you have not eaten for a while (fasting). You may have this done every 1-3 years.  Abdominal aortic aneurysm (AAA) screening. You may need this if you  are a current or former smoker.  Osteoporosis. You may be screened starting at age 62 if you are at high risk. Talk with your health care provider about your test results, treatment options, and if necessary, the need for more tests. Vaccines  Your health care provider may recommend  certain vaccines, such as:  Influenza vaccine. This is recommended every year.  Tetanus, diphtheria, and acellular pertussis (Tdap, Td) vaccine. You may need a Td booster every 10 years.  Zoster vaccine. You may need this after age 53.  Pneumococcal 13-valent conjugate (PCV13) vaccine. One dose is recommended after age 70.  Pneumococcal polysaccharide (PPSV23) vaccine. One dose is recommended after age 96. Talk to your health care provider about which screenings and vaccines you need and how often you need them. This information is not intended to replace advice given to you by your health care provider. Make sure you discuss any questions you have with your health care provider. Document Released: 06/08/2015 Document Revised: 01/30/2016 Document Reviewed: 03/13/2015 Elsevier Interactive Patient Education  2017 [place] Prevention in the Home Falls can cause injuries. They can happen to people of all ages. There are many things you can do to make your home safe and to help prevent falls. What can I do on the outside of my home?  Regularly fix the edges of walkways and driveways and fix any cracks.  Remove anything that might make you trip as you walk through a door, such as a raised step or threshold.  Trim any bushes or trees on the path to your home.  Use bright outdoor lighting.  Clear any walking paths of anything that might make someone trip, such as rocks or tools.  Regularly check to see if handrails are loose or broken. Make sure that both sides of any steps have handrails.  Any raised decks and porches should have guardrails on the edges.  Have any leaves, snow, or ice cleared regularly.  Use sand or salt on walking paths during winter.  Clean up any spills in your garage right away. This includes oil or grease spills. What can I do in the bathroom?  Use night lights.  Install grab bars by the toilet and in the tub and shower. Do not use towel bars as  grab bars.  Use non-skid mats or decals in the tub or shower.  If you need to sit down in the shower, use a plastic, non-slip stool.  Keep the floor dry. Clean up any water that spills on the floor as soon as it happens.  Remove soap buildup in the tub or shower regularly.  Attach bath mats securely with double-sided non-slip rug tape.  Do not have throw rugs and other things on the floor that can make you trip. What can I do in the bedroom?  Use night lights.  Make sure that you have a light by your bed that is easy to reach.  Do not use any sheets or blankets that are too big for your bed. They should not hang down onto the floor.  Have a firm chair that has side arms. You can use this for support while you get dressed.  Do not have throw rugs and other things on the floor that can make you trip. What can I do in the kitchen?  Clean up any spills right away.  Avoid walking on wet floors.  Keep items that you use a lot in easy-to-reach places.  If you need to reach  something above you, use a strong step stool that has a grab bar.  Keep electrical cords out of the way.  Do not use floor polish or wax that makes floors slippery. If you must use wax, use non-skid floor wax.  Do not have throw rugs and other things on the floor that can make you trip. What can I do with my stairs?  Do not leave any items on the stairs.  Make sure that there are handrails on both sides of the stairs and use them. Fix handrails that are broken or loose. Make sure that handrails are as long as the stairways.  Check any carpeting to make sure that it is firmly attached to the stairs. Fix any carpet that is loose or worn.  Avoid having throw rugs at the top or bottom of the stairs. If you do have throw rugs, attach them to the floor with carpet tape.  Make sure that you have a light switch at the top of the stairs and the bottom of the stairs. If you do not have them, ask someone to add them  for you. What else can I do to help prevent falls?  Wear shoes that:  Do not have high heels.  Have rubber bottoms.  Are comfortable and fit you well.  Are closed at the toe. Do not wear sandals.  If you use a stepladder:  Make sure that it is fully opened. Do not climb a closed stepladder.  Make sure that both sides of the stepladder are locked into place.  Ask someone to hold it for you, if possible.  Clearly mark and make sure that you can see:  Any grab bars or handrails.  First and last steps.  Where the edge of each step is.  Use tools that help you move around (mobility aids) if they are needed. These include:  Canes.  Walkers.  Scooters.  Crutches.  Turn on the lights when you go into a dark area. Replace any light bulbs as soon as they burn out.  Set up your furniture so you have a clear path. Avoid moving your furniture around.  If any of your floors are uneven, fix them.  If there are any pets around you, be aware of where they are.  Review your medicines with your doctor. Some medicines can make you feel dizzy. This can increase your chance of falling. Ask your doctor what other things that you can do to help prevent falls. This information is not intended to replace advice given to you by your health care provider. Make sure you discuss any questions you have with your health care provider. Document Released: 03/08/2009 Document Revised: 10/18/2015 Document Reviewed: 06/16/2014 Elsevier Interactive Patient Education  2017 [employer].

## 2019-02-07 NOTE — Progress Notes (Signed)
I have reviewed and agree with note, evaluation, plan.   Stephen Hunter, MD  

## 2019-02-10 ENCOUNTER — Ambulatory Visit (HOSPITAL_COMMUNITY)
Admission: RE | Admit: 2019-02-10 | Discharge: 2019-02-10 | Disposition: A | Discharge status: Home / Self Care | Payer: Medicare Other | Source: Ambulatory Visit | Attending: Cardiology | Admitting: Cardiology

## 2019-02-10 ENCOUNTER — Encounter (HOSPITAL_COMMUNITY): Payer: Self-pay | Admitting: Cardiology

## 2019-02-10 ENCOUNTER — Other Ambulatory Visit: Payer: Self-pay

## 2019-02-10 VITALS — BP 106/71 | HR 87 | Wt 167.8 lb

## 2019-02-10 DIAGNOSIS — I4821 Permanent atrial fibrillation: Secondary | ICD-10-CM | POA: Diagnosis not present

## 2019-02-10 DIAGNOSIS — N183 Chronic kidney disease, stage 3 (moderate): Secondary | ICD-10-CM | POA: Insufficient documentation

## 2019-02-10 DIAGNOSIS — I4819 Other persistent atrial fibrillation: Secondary | ICD-10-CM

## 2019-02-10 DIAGNOSIS — I2510 Atherosclerotic heart disease of native coronary artery without angina pectoris: Secondary | ICD-10-CM | POA: Diagnosis not present

## 2019-02-10 DIAGNOSIS — I447 Left bundle-branch block, unspecified: Secondary | ICD-10-CM | POA: Diagnosis not present

## 2019-02-10 DIAGNOSIS — E785 Hyperlipidemia, unspecified: Secondary | ICD-10-CM | POA: Diagnosis not present

## 2019-02-10 DIAGNOSIS — Z87891 Personal history of nicotine dependence: Secondary | ICD-10-CM | POA: Insufficient documentation

## 2019-02-10 DIAGNOSIS — Z7901 Long term (current) use of anticoagulants: Secondary | ICD-10-CM | POA: Diagnosis not present

## 2019-02-10 DIAGNOSIS — I130 Hypertensive heart and chronic kidney disease with heart failure and stage 1 through stage 4 chronic kidney disease, or unspecified chronic kidney disease: Secondary | ICD-10-CM | POA: Insufficient documentation

## 2019-02-10 DIAGNOSIS — Z79899 Other long term (current) drug therapy: Secondary | ICD-10-CM | POA: Diagnosis not present

## 2019-02-10 DIAGNOSIS — I5022 Chronic systolic (congestive) heart failure: Secondary | ICD-10-CM | POA: Diagnosis not present

## 2019-02-10 DIAGNOSIS — I272 Pulmonary hypertension, unspecified: Secondary | ICD-10-CM

## 2019-02-10 LAB — BASIC METABOLIC PANEL
Anion gap: 11 (ref 5–15)
BUN: 29 mg/dL — ABNORMAL HIGH (ref 8–23)
CO2: 24 mmol/L (ref 22–32)
Calcium: 8.7 mg/dL — ABNORMAL LOW (ref 8.9–10.3)
Chloride: 99 mmol/L (ref 98–111)
Creatinine, Ser: 1.42 mg/dL — ABNORMAL HIGH (ref 0.61–1.24)
GFR calc Af Amer: 54 mL/min — ABNORMAL LOW (ref >60)
GFR calc non Af Amer: 47 mL/min — ABNORMAL LOW (ref >60)
Glucose, Bld: 112 mg/dL — ABNORMAL HIGH (ref 70–99)
Potassium: 5 mmol/L (ref 3.5–5.1)
Sodium: 134 mmol/L — ABNORMAL LOW (ref 135–145)

## 2019-02-10 LAB — DIGOXIN LEVEL: Digoxin Level: 0.6 ng/mL — ABNORMAL LOW (ref 0.8–2.0)

## 2019-02-10 MED ORDER — SPIRONOLACTONE 25 MG PO TABS: 25.0000 mg | ORAL_TABLET | Freq: Every evening | ORAL | 3 refills | Status: DC

## 2019-02-10 NOTE — Patient Instructions (Signed)
Labs done today. We will notify you of any abnormal lab work. No news is good news!  Lab work will need to be done again in 10 days.  EKG done today.  INCREASE Spironolactone to 25mg  daily.  You have been referred to Cardiac Rehab at [facility]. They will contact you in order to set up appointments.  Please follow up with the [facility] in 2 months.  At the [facility], you and your health needs are our priority. As part of our continuing mission to provide you with exceptional heart care, we have created designated Provider Care Teams. These Care Teams include your primary Cardiologist (physician) and Advanced Practice Providers (APPs- Physician Assistants and Nurse Practitioners) who all work together to provide you with the care you need, when you need it.   You may see any of the following providers on your designated Care Team at your next follow up: Marland Kitchen Dr Glori Bickers . Dr [name] . Darrick Grinder, NP   Please be sure to bring in all your medications bottles to every appointment.

## 2019-02-10 NOTE — Progress Notes (Signed)
Date:  02/10/2019   ID:  [name], DOB February 10, 1941, MRN [id number]   Provider location: [place] [facility] Type of Visit: Established patient   PCP:  [name], MD  Cardiologist: [name] EP: [name]/[name]   History of Present Illness: Travis [name] is a 78 y.o. 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   Pt failed DCCV 08/14/17, 08/31/17, and 09/17/17. Failed Tikosyn. Did have conversion with Amiodarone and DCCV. Pt underwent successful ablation 10/07/17.  However, he had ERAF.  Admitted 5/18 - 10/14/17 with atrial fibrillation/RVR and acute on chronic systolic CHF.  Echo showed EF 30-35%. Diuresed and reloaded with Amio. Underwent successful DCCV 10/13/17.    Echo in 9/19 showed improvement in EF to 50% in NSR.   In 9/19, patient reported profound fatigue.  He was volume overloaded on exam and diuretic was increased.  CBC was done, hgb was low and patient ended up going to the ER for admission.  He was diagnosed with UGIB likely from duodenal ulcer, probably due to meloxicam.  This was stopped.   Again in 10/19, he was profoundly fatigued.  CBC showed hgb 6.7, he was sent to the ER where he had 2 unit transfusion and EGD was done again, showing duodenal stenosis.  I also stopped his atorvastatin.   He went back into atrial fibrillation and had successful TEE-DCCV in 2/20.  TEE showed EF 35-40% with mildly decreased RV systolic function, EF back down in setting of atrial fibrillation.  In 3/20, he was back in atrial fibrillation and has been in atrial fibrillation since then.  He saw [name] and it was decided not to pursue redo ablation.  Repeat echo in 7/20 showed EF 20-25%, moderately decreased RV systolic function, moderate-severe TR,  bicuspid aortic valve with moderate AI, dilated IVC.  Due to permanent atrial fibrillation and rapid rate at times with LBBB, it was decided to pursue AV nodal ablation with BiV pacing. He had Medtronic CRT-P placement in 8/20, plan for AV nodal ablation in 9/20.   Patient returns for followup of CHF and atrial fibrillation.  He remains in atrial fibrillation with BiV pacing. HR is more controlled now (upper 80s today).  He still feels like his stamina is poor.  However, he can walk for about 10 minutes on a treadmill and ride an exercise bike for 10 minutes without significant dyspnea.  No orthopnea/PND.  No chest pain.  No lightheadedness.   ECG (personally reviewed): atrial fibrillation, BiV pacing, rate 87  Labs (3/19): LDL 65 Labs (8/19): K 5.2 => 4.1, creatinine 1.6 Labs (9/19): K 3.9, creatinine 1.78, hgb 9.6, TSH normal.  Labs (10/19): hgb 6.7 => 11.8, LFTs normal, K 3.6, creatinine 1.3 Labs (11/19): LFTs, TSH normal Labs (1/20): K 4, creatinine 1.15, hgb 14.5 Labs (2/20): Hgb 13.4 Labs (4/20): LFTs normal, TSH elevated Labs (6/20): LDL 38, HDL 64, K 4.2, creatinine 1.45, hgb 14.7 Labs (7/20): K 4.4, creatinine 1.43, TSH and free T3 normal, mildly elevated free T4, LFTs normal Labs (8/20): K 4.9, creatinine 1.26, digoxin 0.9  Past Medical History 1. Chronic systolic CHF: Possible tachy-mediated cardiomyopathy.    - Echo(08/13/2017): LVEF 30-35%, moderate AS, mild AI, severe LAE, RV mild dilated with systolic function mild/mod reduced, PA peak pressure 46  mm Hg. - Echo (9/19): EF 50%, mild diffuse hypokinesis, normal RV size and systolic function, PASP 70 mmHg, moderate AS, moderate AI, moderate MR.  - TEE (2/20): EF 35-40%, mild-moderate LV dilation, normal RV size with mildly decreased systolic function, mil MR, trileaflet aortic valve with mild AS (mean gradient 10 mmHg, AVA 1.7 cm^2), moderate AI.  - Echo (7/20): EF 20-25%, moderately decreased RV systolic function,  moderate-severe TR, bicuspid aortic valve with moderate AI, dilated IVC.  - Medtronic CRT-P placement 8/20.  2. Atrial fibrillation: Has been difficult to control.  Failed Tikosyn and multiple DCCVs.  Failed amiodarone prior to ablation.  Atrial fibrillation ablation but then had ERAF.  He was cardioverted again in 5/19 with return to NSR.  DCCV again in 2/20 but back in atrial fibrillation in 3/20.  3. Aortic valve disorder: TEE 2/20 with mild AS, moderate AI (valve is not bicuspid).   4. HTN 5. Hyperlipidemia: Severe fatigue with atorvastatin, myalgias with Crestor.  6. Mitral regurgitation: Mild on last echo.  7. CAD: coronary calcium noted on chest CT.  8. CKD: Stage 3.  9. Carotid stenosis: Carotid dopplers (123XX123) with 123456 LICA stenosis.  - Carotid dopplers ([id number]): 123456 LICA stenosis.  10. Chronic LBBB 11. PUD: UGIB in 9/19 with duodenal ulcer probably due to meloxicam use.  - EGD (10/19): Duodenal stenosis.  12. Inguinal hernia  Current Outpatient Medications  Medication Sig Dispense Refill  . acetaminophen (TYLENOL) 500 MG tablet Take 1,000 mg by mouth every 8 (eight) hours as needed for moderate pain or headache.    . carvedilol (COREG) 25 MG tablet Take 1 tablet (25 mg total) by mouth 2 (two) times daily. 180 tablet 3  . digoxin (LANOXIN) 0.125 MG tablet Take 1 tablet (0.125 mg total) by mouth daily. 90 tablet 3  . Evolocumab (REPATHA SURECLICK) XX123456 MG/ML SOAJ Inject 140 mg into the skin every 14 (fourteen) days. Every other Wednesday    . finasteride (PROPECIA) 1 MG tablet Take 1 mg by mouth daily.    Marland Kitchen losartan (COZAAR) 25 MG tablet Take 1 tablet (25 mg total) by mouth every evening. 90 tablet 3  . pantoprazole (PROTONIX) 40 MG tablet TAKE 1 TABLET(40 MG) BY MOUTH TWICE DAILY 60 tablet 3  . Rivaroxaban (XARELTO) 15 MG TABS tablet Take 15 mg by mouth at bedtime.     Marland Kitchen spironolactone (ALDACTONE) 25 MG tablet Take 1 tablet (25 mg total) by mouth every evening. 45 tablet 3  .  temazepam (RESTORIL) 15 MG capsule Take 15 mg by mouth at bedtime as needed for sleep.    Marland Kitchen torsemide (DEMADEX) 20 MG tablet Take 40 mg by mouth daily.    . traMADol (ULTRAM) 50 MG tablet Take 50 mg by mouth 3 (three) times daily.      No current facility-administered medications for this encounter.     Allergies:   Patient has no known allergies.   Social History:  The patient  reports that he quit smoking about 32 years ago. He has a 37.50 pack-year smoking history. He has never used smokeless tobacco. He reports previous alcohol use of about 1.0 standard drinks of alcohol per week. He reports that he does not use drugs.   Family History:  The patient's family history includes Lung cancer (age of onset: 73) in his mother; Stroke in his brother and mother; Stroke (age of onset: 46) in his father.   ROS:  Please see the history of present illness.   All  other systems are personally reviewed and negative.   Exam:   BP 106/71   Pulse 87   Wt 76.1 kg (167 lb 12.8 oz)   SpO2 97%   BMI 25.51 kg/m  General: NAD Neck: No JVD, no thyromegaly or thyroid nodule.  Lungs: Clear to auscultation bilaterally with normal respiratory effort. CV: Nondisplaced PMI.  Heart regular S1/S2, no S3/S4, no murmur.  No peripheral edema.  No carotid bruit.  Normal pedal pulses.  Abdomen: Soft, nontender, no hepatosplenomegaly, no distention.  Skin: Intact without lesions or rashes.  Neurologic: Alert and oriented x 3.  Psych: Normal affect. Extremities: No clubbing or cyanosis.  HEENT: Normal.   Recent Labs: 12/06/2018: ALT 15 12/21/2018: TSH 4.279 01/21/2019: Hemoglobin 14.5; Platelets 208 02/10/2019: BUN 29; Creatinine, Ser 1.42; Potassium 5.0; Sodium 134  Personally reviewed   Wt Readings from Last 3 Encounters:  02/10/19 76.1 kg (167 lb 12.8 oz)  02/07/19 75.8 kg (167 lb)  02/01/19 74.4 kg (164 lb)      ASSESSMENT AND PLAN:  1. Chronic systolic CHF: Echo 123XX123 with EF 30-35%, mild to moderately  decreased RV systolic function in setting of atrial fibrillation with RVR.  Echo in 9/19 with patient in NSR showed EF up to 50%, mild diffuse hypokinesis.  Back in atrial fibrillation in 2/20, TEE showed EF 35-40%.  Suspect he has atrial fibrillation/tachycardia-mediated cardiomyopathy given fall in EF with recurrent atrial fibrillation.  Most recent echo in 7/20 with EF 20-25%, moderately decreased RV systolic function.  He is now s/p Medtronic CRT-P placement. NYHA class II symptoms, he is not volume overloaded on exam today. HR control is improved after BiV pacing even without having AV nodal ablation yet.  - Continue torsemide 40 daily.  - Continue digoxin 0.125, check level today.  - Continue losartan 25 mg daily, I will not increase dose given episodes of low BP.    - Continue Coreg 25 mg bid.     - Increase spironolactone to 25 mg daily.  BMET 10 days.   - Refer to cardiac rehab to help with stamina.  2. Atrial fibrillation: Persistent.  Difficult to control rate and rhythm. He failed Tikosyn. He started on amiodarone, then had atrial fibrillation ablation in 5/19 with ERAF, requiring DCCV.  Most recently cardioverted in 2/20 but back in atrial fibrillation by 3/20 and has stayed in atrial fibrillation since.  He saw [name] and they decided against redo ablation. HR currently in upper 80s after CRT-P placement.    - He is off amiodarone given likely permanent atrial fibrillation.   - For ideal rate control, AV nodal ablation is planned later this month.  He has MDT CRT-P device.  - Continue Xarelto 15 mg daily.  3. CAD: Extensive coronary calcification on last CT chest. No chest pain. I suspect that his cardiomyopathy is tachycardia-mediated/atrial fibrillation-related given rise in EF in NSR and then fall when back in atrial fibrillation rather than ischemic cardiomyopathy.  - No ASA with Xarelto use.  - Unable to tolerate statins.  He will continue Repatha, good LDL on last lipids  check.   4. CKD: Stage 3. BMET today.  5. Aortic valve disease: TEE in 2/20 showed that aortic valve was not bicuspid (though TTE reports have read bicuspid) but did have mild stenosis and moderate AI.  Moderate AI on 7/20 echo.  6. Tricuspid regurgitation: Moderate-severe on 7/20 echo.   7. LBBB: Chronic, now s/p CRT.  8. Hyperlipidemia: Unable to tolerate statins.  Now on Repatha.  Good lipids in 5/20.  9. Carotid stenosis: Repeat carotid dopplers in 7/21.    Recommended follow-up:  2 months  Signed, [name], MD  02/10/2019  [facility] [address] [facility] [place] [address] [phone] (office) [phone] (fax)

## 2019-02-11 ENCOUNTER — Telehealth (HOSPITAL_COMMUNITY): Payer: Self-pay

## 2019-02-11 NOTE — Telephone Encounter (Signed)
Pt insurance is active and benefits verified through Medicare A/B. Co-pay $0.00, DED $0.00/$0.00 met, out of pocket $198.00/$198.00 met, co-insurance 20%. No pre-authorization required. Passport, 02/11/2019 @ 3:56PM, DQQ#[id number]  2ndary insurance is active and benefits verified through [employer]. Co-pay $0.00, DED $0.00/$0.00 met, out of pocket $0.00/$0.00 met, co-insurance 0%. No pre-authorization required. Passport, 02/11/2019 @ 4:01PM, CXK#[id number]  Will contact patient to see if he is interested in the Cardiac Rehab Program. If interested, patient will need to complete follow up appt. Once completed, patient will be contacted for scheduling upon review by the RN Navigator.

## 2019-02-11 NOTE — Telephone Encounter (Signed)
Called patient to see if he is interested in the Cardiac Rehab Program. Patient expressed interest. Explained scheduling process and went over insurance, patient verbalized understanding. Will contact patient for scheduling once f/u has been completed.  °

## 2019-02-14 ENCOUNTER — Other Ambulatory Visit (HOSPITAL_COMMUNITY): Payer: Medicare Other

## 2019-02-14 ENCOUNTER — Telehealth: Payer: Self-pay | Admitting: Internal Medicine

## 2019-02-14 NOTE — Telephone Encounter (Signed)
New message   Patient is calling to get instructions for procedure coming up this Friday. Please call.

## 2019-02-15 ENCOUNTER — Other Ambulatory Visit (HOSPITAL_COMMUNITY)
Admission: RE | Admit: 2019-02-15 | Discharge: 2019-02-15 | Disposition: A | Discharge status: Home / Self Care | Payer: Medicare Other | Source: Ambulatory Visit | Attending: Internal Medicine | Admitting: Internal Medicine

## 2019-02-15 DIAGNOSIS — Z01812 Encounter for preprocedural laboratory examination: Secondary | ICD-10-CM | POA: Insufficient documentation

## 2019-02-15 DIAGNOSIS — Z20828 Contact with and (suspected) exposure to other viral communicable diseases: Secondary | ICD-10-CM | POA: Insufficient documentation

## 2019-02-15 NOTE — Telephone Encounter (Signed)
Follow up   Patient is calling to get additional instructions about procedure on Friday. Please call.

## 2019-02-16 LAB — NOVEL CORONAVIRUS, NAA (HOSP ORDER, SEND-OUT TO REF LAB; TAT 18-24 HRS): SARS-CoV-2, NAA: NOT DETECTED

## 2019-02-18 ENCOUNTER — Encounter (HOSPITAL_COMMUNITY)
Admission: RE | Disposition: A | Discharge status: Home / Self Care | Payer: Medicare Other | Source: Home / Self Care | Attending: Internal Medicine

## 2019-02-18 ENCOUNTER — Other Ambulatory Visit: Payer: Self-pay

## 2019-02-18 ENCOUNTER — Ambulatory Visit (HOSPITAL_COMMUNITY)
Admission: RE | Admit: 2019-02-18 | Discharge: 2019-02-18 | Disposition: A | Discharge status: Home / Self Care | Payer: Medicare Other | Attending: Internal Medicine | Admitting: Internal Medicine

## 2019-02-18 DIAGNOSIS — I130 Hypertensive heart and chronic kidney disease with heart failure and stage 1 through stage 4 chronic kidney disease, or unspecified chronic kidney disease: Secondary | ICD-10-CM | POA: Diagnosis not present

## 2019-02-18 DIAGNOSIS — K4090 Unilateral inguinal hernia, without obstruction or gangrene, not specified as recurrent: Secondary | ICD-10-CM | POA: Insufficient documentation

## 2019-02-18 DIAGNOSIS — I4819 Other persistent atrial fibrillation: Secondary | ICD-10-CM | POA: Insufficient documentation

## 2019-02-18 DIAGNOSIS — K279 Peptic ulcer, site unspecified, unspecified as acute or chronic, without hemorrhage or perforation: Secondary | ICD-10-CM | POA: Diagnosis not present

## 2019-02-18 DIAGNOSIS — I2510 Atherosclerotic heart disease of native coronary artery without angina pectoris: Secondary | ICD-10-CM | POA: Diagnosis not present

## 2019-02-18 DIAGNOSIS — E785 Hyperlipidemia, unspecified: Secondary | ICD-10-CM | POA: Insufficient documentation

## 2019-02-18 DIAGNOSIS — I6523 Occlusion and stenosis of bilateral carotid arteries: Secondary | ICD-10-CM | POA: Insufficient documentation

## 2019-02-18 DIAGNOSIS — I5022 Chronic systolic (congestive) heart failure: Secondary | ICD-10-CM | POA: Insufficient documentation

## 2019-02-18 DIAGNOSIS — I447 Left bundle-branch block, unspecified: Secondary | ICD-10-CM | POA: Diagnosis not present

## 2019-02-18 DIAGNOSIS — Z950 Presence of cardiac pacemaker: Secondary | ICD-10-CM | POA: Insufficient documentation

## 2019-02-18 DIAGNOSIS — I4821 Permanent atrial fibrillation: Secondary | ICD-10-CM

## 2019-02-18 DIAGNOSIS — I358 Other nonrheumatic aortic valve disorders: Secondary | ICD-10-CM | POA: Diagnosis not present

## 2019-02-18 DIAGNOSIS — Z79899 Other long term (current) drug therapy: Secondary | ICD-10-CM | POA: Diagnosis not present

## 2019-02-18 DIAGNOSIS — Z87891 Personal history of nicotine dependence: Secondary | ICD-10-CM | POA: Diagnosis not present

## 2019-02-18 DIAGNOSIS — N183 Chronic kidney disease, stage 3 (moderate): Secondary | ICD-10-CM | POA: Insufficient documentation

## 2019-02-18 DIAGNOSIS — Z7901 Long term (current) use of anticoagulants: Secondary | ICD-10-CM | POA: Diagnosis not present

## 2019-02-18 HISTORY — PX: AV NODE ABLATION: EP1193

## 2019-02-18 LAB — BASIC METABOLIC PANEL
Anion gap: 11 (ref 5–15)
BUN: 30 mg/dL — ABNORMAL HIGH (ref 8–23)
CO2: 23 mmol/L (ref 22–32)
Calcium: 9 mg/dL (ref 8.9–10.3)
Chloride: 104 mmol/L (ref 98–111)
Creatinine, Ser: 1.57 mg/dL — ABNORMAL HIGH (ref 0.61–1.24)
GFR calc Af Amer: 48 mL/min — ABNORMAL LOW (ref >60)
GFR calc non Af Amer: 42 mL/min — ABNORMAL LOW (ref >60)
Glucose, Bld: 128 mg/dL — ABNORMAL HIGH (ref 70–99)
Potassium: 4.4 mmol/L (ref 3.5–5.1)
Sodium: 138 mmol/L (ref 135–145)

## 2019-02-18 SURGERY — AV NODE ABLATION

## 2019-02-18 MED ORDER — HEPARIN (PORCINE) IN NACL 1000-0.9 UT/500ML-% IV SOLN
INTRAVENOUS | Status: AC
  Filled 2019-02-18: qty 500

## 2019-02-18 MED ORDER — SODIUM CHLORIDE 0.9% FLUSH: 3.0000 mL | Freq: Two times a day (BID) | INTRAVENOUS | Status: DC

## 2019-02-18 MED ORDER — FENTANYL CITRATE (PF) 100 MCG/2ML IJ SOLN
INTRAMUSCULAR | Status: DC | PRN
  Administered 2019-02-18 (×2): 12.5 ug via INTRAVENOUS

## 2019-02-18 MED ORDER — BUPIVACAINE HCL (PF) 0.25 % IJ SOLN
INTRAMUSCULAR | Status: DC | PRN
  Administered 2019-02-18: 30 mL

## 2019-02-18 MED ORDER — SODIUM CHLORIDE 0.9 % IV SOLN: 250.0000 mL | INTRAVENOUS | Status: DC | PRN

## 2019-02-18 MED ORDER — SODIUM CHLORIDE 0.9% FLUSH: 3.0000 mL | INTRAVENOUS | Status: DC | PRN

## 2019-02-18 MED ORDER — ACETAMINOPHEN 325 MG PO TABS: 650.0000 mg | ORAL_TABLET | ORAL | Status: DC | PRN

## 2019-02-18 MED ORDER — ONDANSETRON HCL 4 MG/2ML IJ SOLN: 4.0000 mg | Freq: Four times a day (QID) | INTRAMUSCULAR | Status: DC | PRN

## 2019-02-18 MED ORDER — MIDAZOLAM HCL 5 MG/5ML IJ SOLN
INTRAMUSCULAR | Status: AC
  Filled 2019-02-18: qty 5

## 2019-02-18 MED ORDER — MIDAZOLAM HCL 5 MG/5ML IJ SOLN
INTRAMUSCULAR | Status: DC | PRN
  Administered 2019-02-18 (×3): 1 mg via INTRAVENOUS

## 2019-02-18 MED ORDER — BUPIVACAINE HCL (PF) 0.25 % IJ SOLN
INTRAMUSCULAR | Status: AC
  Filled 2019-02-18: qty 30

## 2019-02-18 MED ORDER — HEPARIN (PORCINE) IN NACL 2-0.9 UNITS/ML
INTRAMUSCULAR | Status: AC | PRN
  Administered 2019-02-18: 500 mL

## 2019-02-18 MED ORDER — SODIUM CHLORIDE 0.9 % IV SOLN
INTRAVENOUS | Status: DC
  Administered 2019-02-18: 10:00:00 via INTRAVENOUS

## 2019-02-18 MED ORDER — FENTANYL CITRATE (PF) 100 MCG/2ML IJ SOLN
INTRAMUSCULAR | Status: AC
  Filled 2019-02-18: qty 2

## 2019-02-18 SURGICAL SUPPLY — 9 items
CATH BLAZER 7FR 4MM LG 5031TK2 (ABLATOR) ×2 IMPLANT
CATH THERMISTOR 8FR 6MM 5086T (ABLATOR) ×2 IMPLANT
INTRODUCER SWARTZ SL2 8F (SHEATH) ×2 IMPLANT
INTRODUCER SWARTZ SRO 8F (SHEATH) ×2 IMPLANT
KIT MICROPUNCTURE NIT STIFF (SHEATH) ×2 IMPLANT
PACK EP LATEX FREE (CUSTOM PROCEDURE TRAY) ×3
PACK EP LF (CUSTOM PROCEDURE TRAY) ×1 IMPLANT
PAD PRO RADIOLUCENT 2001M-C (PAD) ×3 IMPLANT
SHEATH PINNACLE 8F 10CM (SHEATH) ×2 IMPLANT

## 2019-02-18 NOTE — Progress Notes (Addendum)
Site area: Right groin a 8 french venous sheath was removed  Site Prior to Removal:  Level 0  Pressure Applied For 20 MINUTES     Bedrest Beginning at 1410p  Manual:   Yes.    Patient Status During Pull:  stable  Post Pull Groin Site:  Level 0  Post Pull Instructions Given:  Yes.    Post Pull Pulses Present:  Yes.    Dressing Applied:  Yes.    Comments:  Stable

## 2019-02-18 NOTE — Progress Notes (Signed)
No bleeding or swelling noted after ambulation 

## 2019-02-18 NOTE — Discharge Instructions (Signed)
Post procedure care instructions  No driving for 4 days. No lifting over 5 lbs for 1 week. No vigorous or sexual activity for 1 week. You may return to work on 02/26/2019. Keep procedure site clean & dry. If you notice increased pain, swelling, bleeding or pus, call/return!  You may shower, but no soaking baths/hot tubs/pools for 1 week.     Cardiac Ablation, Care After This sheet gives you information about how to care for yourself after your procedure. Your health care provider may also give you more specific instructions. If you have problems or questions, contact your health care provider. What can I expect after the procedure? After the procedure, it is common to have:  Bruising around your puncture site.  Tenderness around your puncture site.  Skipped heartbeats.  Tiredness (fatigue).  Follow these instructions at home: Puncture site care   Follow instructions from your health care provider about how to take care of your puncture site. Make sure you: ? If present, leave stitches (sutures), skin glue, or adhesive strips in place. These skin closures may need to stay in place for up to 2 weeks. If adhesive strip edges start to loosen and curl up, you may trim the loose edges. Do not remove adhesive strips completely unless your health care provider tells you to do that.  Check your puncture site every day for signs of infection. Check for: ? Redness, swelling, or pain. ? Fluid or blood. If your puncture site starts to bleed, lie down on your back, apply firm pressure to the area, and contact your health care provider. ? Warmth. ? Pus or a bad smell. Driving  Do not drive for at least 4 days after your procedure or however long your health care provider recommends.  Do not drive or use heavy machinery while taking prescription pain medicine.  Do not drive for 24 hours if you were given a medicine to help you relax (sedative) during your procedure. Activity  Avoid activities  that take a lot of effort for at least 7 days after your procedure.  Do not lift anything that is heavier than 5 lb (4.5 kg) for one week.   No sexual activity for 1 week.   Return to your normal activities as told by your health care provider. Ask your health care provider what activities are safe for you. General instructions  Take over-the-counter and prescription medicines only as told by your health care provider.  Do not use any products that contain nicotine or tobacco, such as cigarettes and e-cigarettes. If you need help quitting, ask your health care provider.  You may shower after 24 hours, but Do not take baths, swim, or use a hot tub for 1 week.   Do not drink alcohol for 24 hours after your procedure.  Keep all follow-up visits as told by your health care provider. This is important. Contact a health care provider if:  You have redness, mild swelling, or pain around your puncture site.  You have fluid or blood coming from your puncture site that stops after applying firm pressure to the area.  Your puncture site feels warm to the touch.  You have pus or a bad smell coming from your puncture site.  You have a fever.  You have chest pain or discomfort that spreads to your neck, jaw, or arm.  You are sweating a lot.  You feel nauseous.  You have a fast or irregular heartbeat.  You have shortness of breath.  You  are dizzy or light-headed and feel the need to lie down.  You have pain or numbness in the arm or leg closest to your puncture site. Get help right away if:  Your puncture site suddenly swells.  Your puncture site is bleeding and the bleeding does not stop after applying firm pressure to the area. These symptoms may represent a serious problem that is an emergency. Do not wait to see if the symptoms will go away. Get medical help right away. Call your local emergency services (911 in the [place] Do not drive yourself to the hospital. Summary  After  the procedure, it is normal to have bruising and tenderness at the puncture site in your groin, neck, or forearm.  Check your puncture site every day for signs of infection.  Get help right away if your puncture site is bleeding and the bleeding does not stop after applying firm pressure to the area. This is a medical emergency. This information is not intended to replace advice given to you by your health care provider. Make sure you discuss any questions you have with your health care provider.

## 2019-02-20 NOTE — H&P (Signed)
No interval change from last visit Pacemaker site well healed   Here for AV ablation

## 2019-02-21 ENCOUNTER — Other Ambulatory Visit: Payer: Self-pay

## 2019-02-21 ENCOUNTER — Other Ambulatory Visit: Payer: Self-pay | Admitting: Family Medicine

## 2019-02-21 ENCOUNTER — Encounter (HOSPITAL_COMMUNITY): Payer: Self-pay | Admitting: Internal Medicine

## 2019-02-21 ENCOUNTER — Ambulatory Visit (HOSPITAL_COMMUNITY)
Admission: RE | Admit: 2019-02-21 | Discharge: 2019-02-21 | Disposition: A | Discharge status: Home / Self Care | Payer: Medicare Other | Source: Ambulatory Visit | Attending: Cardiology | Admitting: Cardiology

## 2019-02-21 DIAGNOSIS — I272 Pulmonary hypertension, unspecified: Secondary | ICD-10-CM | POA: Insufficient documentation

## 2019-02-21 LAB — BASIC METABOLIC PANEL
Anion gap: 10 (ref 5–15)
BUN: 25 mg/dL — ABNORMAL HIGH (ref 8–23)
CO2: 25 mmol/L (ref 22–32)
Calcium: 8.7 mg/dL — ABNORMAL LOW (ref 8.9–10.3)
Chloride: 99 mmol/L (ref 98–111)
Creatinine, Ser: 1.44 mg/dL — ABNORMAL HIGH (ref 0.61–1.24)
GFR calc Af Amer: 54 mL/min — ABNORMAL LOW (ref >60)
GFR calc non Af Amer: 46 mL/min — ABNORMAL LOW (ref >60)
Glucose, Bld: 107 mg/dL — ABNORMAL HIGH (ref 70–99)
Potassium: 4.3 mmol/L (ref 3.5–5.1)
Sodium: 134 mmol/L — ABNORMAL LOW (ref 135–145)

## 2019-02-21 NOTE — Telephone Encounter (Signed)
Last OV 02/07/19 Last refill 08/16/18 #90/5 Next OV not scheduled  Forwarding to Dr. Yong Channel

## 2019-02-28 NOTE — Progress Notes (Addendum)
Cardiology Office Note Date:  03/01/2019  Patient ID:  Travis [name], Travis [name] April 25, 1941, MRN [id number] PCP:  [name], MD  Cardiologist:  [name] Electrophysiologist: [name]    Chief Complaint:  Post AV node ablation device rpogramming   History of Present Illness: Travis [name] is a 78 y.o. male with history of recurrent NICM (BiV) in setting of AFib (felt to be 2/2 tachy/afib), HTN, HLD, CKD (III), LBBB, CAD by CT Ca++, is now s/p PPM /AV node ablation.    There is mentioned h/o bicuspid AV, though TEE Feb 2020 noted trileaflet   He comes in today to be seen for [name].  He is now s/p AV node ablation on 02/18/19  He is doing well, does not yet feel like he has had any notable improvement in his general level of energy and was hoping he would.  No CP or SOB, no syncope.  He had no complications or concerns with his groin site, healed well. No bleeding or signs of bleeding with his xarelto   Afib Hx A number of failed DCCV Failed Tikosyn  AFib Ablation w/EWRAF Amiodarone started may 2019 >> DCCV 01/2018 admitted with symptomatic anemia and UGIB  (ulcer likely 2/2 NSAID) Feb 2020 AFib > TEE/DCCV March back in Afib, evaluated by [name], no plans to pursue repeat PVI ablation >>> Permanent AFib >> 01/24/2019 CRT-P implanted  02/18/2019 AV node ablation    Device information MDT dual chamber PPM (RV/LV leads) implanted 01/24/2019 He has a SJM LV lead (system therfore is not MRI compatible) AV node ablation 02/18/2019   Past Medical History:  Diagnosis Date  . Arthritis   . Biatrial enlargement    severe  . Bicuspid aortic valve   . CHF (congestive heart failure) ([place])    JULY 2014  . Chronic renal insufficiency    not aware  . GERD (gastroesophageal reflux disease)   . History of cardioversion 12/16/2012  . History of stomach ulcers   . Hypertension   . Left bundle branch block   . Nonischemic cardiomyopathy ([place])       . Persistent atrial  fibrillation    multiple prior cardioversion  . Sinus bradycardia   . Status post clamping of cerebral aneurysm 80    Past Surgical History:  Procedure Laterality Date  . ATRIAL FIBRILLATION ABLATION N/A 10/06/2017   Procedure: ATRIAL FIBRILLATION ABLATION;  Surgeon: [name], MD;  Location: [place] [facility];  Service: Cardiovascular;  Laterality: N/A;  . AV NODE ABLATION N/A 02/18/2019   Procedure: AV NODE ABLATION;  Surgeon: Deboraha Sprang, MD;  Location: [place] [facility];  Service: Cardiovascular;  Laterality: N/A;  . BIOPSY  02/06/2018   Procedure: BIOPSY;  Surgeon: Lavena Bullion, DO;  Location: [place] [facility];  Service: Gastroenterology;;  . BIOPSY  02/28/2018   Procedure: BIOPSY;  Surgeon: Mauri Pole, MD;  Location: [place] [facility];  Service: Endoscopy;;  . BIV PACEMAKER INSERTION CRT-P N/A 01/24/2019   Procedure: BIV PACEMAKER INSERTION CRT-P;  Surgeon: Deboraha Sprang, MD;  Location: [place] [facility];  Service: Cardiovascular;  Laterality: N/A;  . CARDIAC CATHETERIZATION     not awaer of this  . CARDIOVERSION N/A 12/16/2012   Procedure: CARDIOVERSION;  Surgeon: Lelon Perla, MD;  Location: [facility];  Service: Cardiovascular;  Laterality: N/A;  . CARDIOVERSION N/A 08/14/2017   Procedure: CARDIOVERSION;  Surgeon: Josue Hector, MD;  Location: [place];  Service: Cardiovascular;  Laterality:  N/A;  . CARDIOVERSION N/A 09/21/2017   Procedure: CARDIOVERSION;  Surgeon: Sanda Klein, MD;  Location: [place] ENDOSCOPY;  Service: Cardiovascular;  Laterality: N/A;  . CARDIOVERSION N/A 10/13/2017   Procedure: CARDIOVERSION;  Surgeon: Josue Hector, MD;  Location: [facility] ENDOSCOPY;  Service: Cardiovascular;  Laterality: N/A;  . CARDIOVERSION N/A 07/05/2018   Procedure: CARDIOVERSION;  Surgeon: Larey Dresser, MD;  Location: [facility] ENDOSCOPY;  Service: Cardiovascular;  Laterality: N/A;  . CATARACT EXTRACTION  05/2015   bilateral  . cerebral anuersym post clips    .  ESOPHAGOGASTRODUODENOSCOPY N/A 02/06/2018   Procedure: ESOPHAGOGASTRODUODENOSCOPY (EGD);  Surgeon: Lavena Bullion, DO;  Location: [facility] ENDOSCOPY;  Service: Gastroenterology;  Laterality: N/A;  . ESOPHAGOGASTRODUODENOSCOPY N/A 02/28/2018   Procedure: ESOPHAGOGASTRODUODENOSCOPY (EGD);  Surgeon: Mauri Pole, MD;  Location: [facility] ENDOSCOPY;  Service: Endoscopy;  Laterality: N/A;  . fractured left arm    . HERNIA REPAIR     lft  . INGUINAL HERNIA REPAIR  07/02/2011   Procedure: LAPAROSCOPIC INGUINAL HERNIA;  Surgeon: Harl Bowie, MD;  Location: [place];  Service: General;  Laterality: Left;  Laparoscopic left inguinal hernia repair and mesh  . INGUINAL HERNIA REPAIR Left 11/24/2018   Procedure: OPEN LEFT INGUINAL HERNIA REPAIR WITH MESH;  Surgeon: Coralie Keens, MD;  Location: [place];  Service: General;  Laterality: Left;  TAP BLOCK  . left tendon repair     lft foot  . ROTATOR CUFF REPAIR     lf  . TEE WITHOUT CARDIOVERSION N/A 08/14/2017   Procedure: TRANSESOPHAGEAL ECHOCARDIOGRAM (TEE);  Surgeon: Josue Hector, MD;  Location: [facility] ENDOSCOPY;  Service: Cardiovascular;  Laterality: N/A;  . TEE WITHOUT CARDIOVERSION N/A 07/05/2018   Procedure: TRANSESOPHAGEAL ECHOCARDIOGRAM (TEE);  Surgeon: Larey Dresser, MD;  Location: [facility] ENDOSCOPY;  Service: Cardiovascular;  Laterality: N/A;  . TONSILLECTOMY    . TOTAL KNEE ARTHROPLASTY Bilateral 02/06/2014   Procedure: TOTAL KNEE BILATERAL;  Surgeon: Mauri Pole, MD;  Location: [facility];  Service: Orthopedics;  Laterality: Bilateral;  . WRIST GANGLION EXCISION     lft    Current Outpatient Medications  Medication Sig Dispense Refill  . acetaminophen (TYLENOL) 500 MG tablet Take 1,000 mg by mouth every 8 (eight) hours as needed for moderate pain or headache.    . carvedilol (COREG) 12.5 MG tablet Take 12.5 mg by mouth 2 (two) times daily with a meal.    . digoxin (LANOXIN) 0.125 MG tablet Take 1 tablet (0.125 mg total) by mouth daily. 90 tablet 3   . Evolocumab (REPATHA SURECLICK) XX123456 MG/ML SOAJ Inject 140 mg into the skin every 14 (fourteen) days. Every other Wednesday    . finasteride (PROPECIA) 1 MG tablet Take 1 mg by mouth daily.    Marland Kitchen losartan (COZAAR) 25 MG tablet Take 1 tablet (25 mg total) by mouth every evening. 90 tablet 3  . Multiple Vitamins-Minerals (PRESERVISION AREDS 2+MULTI VIT PO) Take 1 capsule by mouth 2 (two) times daily.    . pantoprazole (PROTONIX) 40 MG tablet TAKE 1 TABLET(40 MG) BY MOUTH TWICE DAILY (Patient taking differently: Take 40 mg by mouth 2 (two) times daily. ) 60 tablet 3  . Rivaroxaban (XARELTO) 15 MG TABS tablet Take 15 mg by mouth at bedtime.     Marland Kitchen spironolactone (ALDACTONE) 25 MG tablet Take 1 tablet (25 mg total) by mouth every evening. (Patient taking differently: Take 12.5 mg by mouth daily. ) 45 tablet 3  . temazepam (RESTORIL) 15 MG capsule Take 15 mg  by mouth at bedtime as needed for sleep.    Marland Kitchen torsemide (DEMADEX) 20 MG tablet Take 40 mg by mouth daily.    . traMADol (ULTRAM) 50 MG tablet TAKE 1 TABLET BY MOUTH THREE TIMES DAILY AS NEEDED FOR MODERATE OR SEVERE ARTHRITIS PAIN IN HANDS AND NECK 90 tablet 3   No current facility-administered medications for this visit.     Allergies:   Patient has no known allergies.   Social History:  The patient  reports that he quit smoking about 32 years ago. He has a 37.50 pack-year smoking history. He has never used smokeless tobacco. He reports previous alcohol use of about 1.0 standard drinks of alcohol per week. He reports that he does not use drugs.   Family History:  The patient's family history includes Lung cancer (age of onset: 52) in his mother; Stroke in his brother and mother; Stroke (age of onset: 37) in his father.  ROS:  Please see the history of present illness.    All other systems are reviewed and otherwise negative.   PHYSICAL EXAM:  VS:  BP 110/72   Ht 5\' 8"  (1.727 m)   Wt 167 lb (75.8 kg)   BMI 25.39 kg/m  BMI: Body mass index  is 25.39 kg/m. Well nourished, well developed, in no acute distress  HEENT: normocephalic, atraumatic  Neck: no JVD, carotid bruits or masses Cardiac:  RRR; (paced), no significant murmurs, no rubs, or gallops Lungs:  CTA b/l, no wheezing, rhonchi or rales  Abd: soft, nontender MS: no deformity or atrophy, R groin is soft, nontender Ext: no edema  Skin: warm and dry, no rash Neuro:  No gross deficits appreciated Psych: euthymic mood, full affect  PPM site: is well healed, no erythema edema or heat to the surrounding tissues, no tethering  EKG:  Not done today PPM interrogation done today and reviewed by myself: battery and lead measurements are stable, 98% VP, programmed from VVI 90 to VVIR 80    12/14/2018  IMPRESSIONS  1. The left ventricle has severely reduced systolic function, with an ejection fraction of 20-25%. The cavity size was mildly dilated. Left ventricular diastolic Doppler parameters are consistent with restrictive filling. Elevated mean left atrial  pressure.  2. The right ventricle has moderately reduced systolic function. The cavity was normal. There is no increase in right ventricular wall thickness. Right ventricular systolic pressure is moderately elevated with an estimated pressure of 59.9 mmHg.  3. Left atrial size was moderately dilated.  4. Right atrial size was severely dilated.  5. The mitral valve is degenerative. Mild thickening of the mitral valve leaflet. There is mild mitral annular calcification present.  6. Tricuspid valve regurgitation is moderate-severe.  7. The aortic valve is bicuspid. Severely thickening of the aortic valve. Severe calcifcation of the aortic valve. Aortic valve regurgitation is moderate by color flow Doppler. No stenosis of the aortic valve.  8. Morphologically, the degree of aortic stenosis appears worse than Doppler measurements (Peak velocity 1.6m/s, mean gradient 8mmHg).     Demensionless index is 0.62.  9. The inferior vena  cava was dilated in size with <50% respiratory variability. 10. The average left ventricular global longitudinal strain is -4.2 %.      Recent Labs: 12/06/2018: ALT 15 12/21/2018: TSH 4.279 01/21/2019: Hemoglobin 14.5; Platelets 208 02/21/2019: BUN 25; Creatinine, Ser 1.44; Potassium 4.3; Sodium 134  09/14/2018: VLDL 15 11/08/2018: Chol/HDL Ratio 1.8; Cholesterol, Total 118; HDL 64; LDL Calculated 38; Triglycerides 81  Estimated Creatinine Clearance: 40.9 mL/min (A) (by C-G formula based on SCr of 1.44 mg/dL (H)).   Wt Readings from Last 3 Encounters:  03/01/19 167 lb (75.8 kg)  02/18/19 165 lb (74.8 kg)  02/10/19 167 lb 12.8 oz (76.1 kg)     Other studies reviewed: Additional studies/records reviewed today include: summarized above  ASSESSMENT AND PLAN:  1. PPM     Stable implant site, no evidence of infection     acute implant outputs remain, reprogrammed as above     Base rate decreased to 80 today     Plan for further reduction in pacing rate in 2 weeks   2. Permanent Afib     CHA2DS2Vasc is 4, on xarelto     Now s/p AV node ablation   Xarelto is dosed at 15mg  daily His last couple of Creat have been stable again at his baseline, CrCl is 45    3. NICM     s/p CRT-P     No symptoms or exam findings of fluid OL, weight is stable     Follows with [name]   He is pending perhaps starting cardiac rehab, will talk with [name] about it, he is looking forward to feeling like he can exercise, but thinks maybe should hold off We discussed he has no restrictions from his device/ablation procedures to walking to his tolerance, encouraged to start slow and monitor how he is feeling.  Give another 2 weeks before he resumes upper body (L arm) exercising.    Disposition: as above, he is scheduled as well to see [name] and [name] in the next month or so.      Current medicines are reviewed at length with the patient today.  The patient did not have any concerns  regarding medicines.  Venetia Night, PA-C 03/01/2019 10:15 AM     [facility] [address] [place] [address] [phone] (office)  [phone] (fax)

## 2019-03-01 ENCOUNTER — Ambulatory Visit (INDEPENDENT_AMBULATORY_CARE_PROVIDER_SITE_OTHER): Payer: Medicare Other | Admitting: Physician Assistant

## 2019-03-01 ENCOUNTER — Encounter: Payer: Self-pay | Admitting: Physician Assistant

## 2019-03-01 ENCOUNTER — Other Ambulatory Visit: Payer: Self-pay

## 2019-03-01 VITALS — BP 110/72 | Ht 68.0 in | Wt 167.0 lb

## 2019-03-01 DIAGNOSIS — I5022 Chronic systolic (congestive) heart failure: Secondary | ICD-10-CM

## 2019-03-01 DIAGNOSIS — I420 Dilated cardiomyopathy: Secondary | ICD-10-CM | POA: Diagnosis not present

## 2019-03-01 DIAGNOSIS — I4821 Permanent atrial fibrillation: Secondary | ICD-10-CM

## 2019-03-01 DIAGNOSIS — Z950 Presence of cardiac pacemaker: Secondary | ICD-10-CM

## 2019-03-01 DIAGNOSIS — I6523 Occlusion and stenosis of bilateral carotid arteries: Secondary | ICD-10-CM | POA: Diagnosis not present

## 2019-03-01 NOTE — Patient Instructions (Signed)
Medication Instructions:  Your physician recommends that you continue on your current medications as directed. Please refer to the Current Medication list given to you today.  If you need a refill on your cardiac medications before your next appointment, please call your pharmacy.   Lab work: NONE ORDERED  TODAY  If you have labs (blood work) drawn today and your tests are completely normal, you will receive your results only by: Marland Kitchen MyChart Message (if you have MyChart) OR . A paper copy in the mail If you have any lab test that is abnormal or we need to change your treatment, we will call you to review the results.  Testing/Procedures: NONE ORDERED  TODAY  Follow-Up:  AS SCHEDULED WITH ANDREW TILLERY PA-C 10-21 -20  Any Other Special Instructions Will Be Listed Below (If Applicable).

## 2019-03-15 NOTE — Progress Notes (Signed)
CRT-P device check in clinic s/p AV nodal ablation. Normal device function. Atrial lead off. Thresholds, sensing, impedance consistent with previous measurements. Histograms appropriate for patient and level of activity. No mode switches or ventricular high rate episodes. Patient bi-ventricularly pacing 97.16% of the time. Device programmed with appropriate safety margins for acute settings. LRL turned down to 60. Rate response already on. Estimated longevity 4 years. Patient enrolled in remote follow-up. Keep 2 week follow up with [name].

## 2019-03-16 ENCOUNTER — Other Ambulatory Visit: Payer: Self-pay

## 2019-03-16 ENCOUNTER — Ambulatory Visit (INDEPENDENT_AMBULATORY_CARE_PROVIDER_SITE_OTHER): Payer: Medicare Other | Admitting: Student

## 2019-03-16 DIAGNOSIS — I447 Left bundle-branch block, unspecified: Secondary | ICD-10-CM | POA: Diagnosis not present

## 2019-03-16 DIAGNOSIS — I4821 Permanent atrial fibrillation: Secondary | ICD-10-CM | POA: Diagnosis not present

## 2019-03-16 DIAGNOSIS — I5022 Chronic systolic (congestive) heart failure: Secondary | ICD-10-CM | POA: Diagnosis not present

## 2019-03-16 LAB — CUP PACEART INCLINIC DEVICE CHECK
Battery Remaining Longevity: 48 mo
Battery Voltage: 3.07 V
Brady Statistic AP VP Percent: 0 %
Brady Statistic AP VS Percent: 0 %
Brady Statistic AS VP Percent: 97.18 %
Brady Statistic AS VS Percent: 2.82 %
Brady Statistic RA Percent Paced: 0 %
Brady Statistic RV Percent Paced: 97.18 %
Date Time Interrogation Session: 20201021111620
Implantable Lead Implant Date: 20200831
Implantable Lead Implant Date: 20200831
Implantable Lead Location: 753858
Implantable Lead Location: 753860
Implantable Lead Model: 5076
Implantable Pulse Generator Implant Date: 20200831
Lead Channel Impedance Value: 1102 Ohm
Lead Channel Impedance Value: 1102 Ohm
Lead Channel Impedance Value: 1159 Ohm
Lead Channel Impedance Value: 1216 Ohm
Lead Channel Impedance Value: 3420 Ohm
Lead Channel Impedance Value: 3420 Ohm
Lead Channel Impedance Value: 418 Ohm
Lead Channel Impedance Value: 532 Ohm
Lead Channel Impedance Value: 551 Ohm
Lead Channel Impedance Value: 988 Ohm
Lead Channel Impedance Value: 988 Ohm
Lead Channel Pacing Threshold Amplitude: 0.625 V
Lead Channel Pacing Threshold Amplitude: 3.25 V
Lead Channel Pacing Threshold Pulse Width: 0.4 ms
Lead Channel Pacing Threshold Pulse Width: 0.8 ms
Lead Channel Sensing Intrinsic Amplitude: 10.5 mV
Lead Channel Sensing Intrinsic Amplitude: 10.5 mV
Lead Channel Setting Pacing Amplitude: 3.5 V
Lead Channel Setting Pacing Amplitude: 5 V
Lead Channel Setting Pacing Pulse Width: 0.4 ms
Lead Channel Setting Pacing Pulse Width: 0.8 ms
Lead Channel Setting Sensing Sensitivity: 2.8 mV

## 2019-03-16 NOTE — Patient Instructions (Signed)
The number for cardiac rehab is [place] ([place]) [phone]

## 2019-03-21 ENCOUNTER — Telehealth (HOSPITAL_COMMUNITY): Payer: Self-pay

## 2019-03-21 ENCOUNTER — Encounter (HOSPITAL_COMMUNITY): Payer: Self-pay

## 2019-03-21 NOTE — Telephone Encounter (Signed)
Attempted to call patient in regards to Cardiac Rehab - LM on VM Mailed letter 

## 2019-03-29 ENCOUNTER — Other Ambulatory Visit: Payer: Self-pay | Admitting: Gastroenterology

## 2019-03-29 ENCOUNTER — Other Ambulatory Visit: Payer: Self-pay | Admitting: Cardiology

## 2019-04-07 NOTE — Telephone Encounter (Signed)
No response from pt regarding CR.  Closed referral.  

## 2019-04-11 ENCOUNTER — Encounter: Payer: Self-pay | Admitting: Internal Medicine

## 2019-04-11 ENCOUNTER — Ambulatory Visit (INDEPENDENT_AMBULATORY_CARE_PROVIDER_SITE_OTHER): Payer: Medicare Other | Admitting: Internal Medicine

## 2019-04-11 ENCOUNTER — Other Ambulatory Visit: Payer: Self-pay

## 2019-04-11 VITALS — BP 120/82 | HR 67 | Ht 68.0 in | Wt 171.6 lb

## 2019-04-11 DIAGNOSIS — I5022 Chronic systolic (congestive) heart failure: Secondary | ICD-10-CM | POA: Diagnosis not present

## 2019-04-11 DIAGNOSIS — Z959 Presence of cardiac and vascular implant and graft, unspecified: Secondary | ICD-10-CM | POA: Diagnosis not present

## 2019-04-11 DIAGNOSIS — I6523 Occlusion and stenosis of bilateral carotid arteries: Secondary | ICD-10-CM | POA: Diagnosis not present

## 2019-04-11 DIAGNOSIS — I4819 Other persistent atrial fibrillation: Secondary | ICD-10-CM

## 2019-04-11 DIAGNOSIS — I447 Left bundle-branch block, unspecified: Secondary | ICD-10-CM

## 2019-04-11 NOTE — Patient Instructions (Signed)
Medication Instructions:  Your physician recommends that you continue on your current medications as directed. Please refer to the Current Medication list given to you today.  Labwork: None ordered.  Testing/Procedures: None ordered.  Follow-Up: Your physician recommends that you schedule a follow-up appointment in:   12/17 with Dr Aundra Dubin @ 3:20  10 months with [name]   Any Other Special Instructions Will Be Listed Below (If Applicable).     If you need a refill on your cardiac medications before your next appointment, please call your pharmacy.

## 2019-04-11 NOTE — Progress Notes (Signed)
Patient Care Team: [name], MD as PCP - General (Family Medicine) Larey Dresser, MD as PCP - Advanced Heart Failure (Cardiology) Larey Dresser, MD as Consulting Physician (Cardiology) Milus Banister, MD as Attending Physician (Gastroenterology) Jarome Matin, MD as Consulting Physician (Dermatology) Rutherford Guys, MD as Consulting Physician (Ophthalmology) Paralee Cancel, MD as Consulting Physician (Orthopedic Surgery) Joycie Peek, MD (General Practice)   HPI  Travis [name] is a 78 y.o. male Seen in followup for persistent atrial fibrillation and tachycardia-induced and resolved cardiomyopathy; LBBB  Because of recurrent events underwent CRT-P and AV ablation Staged fall 2020  He is much improved with more energy and less dyspnea and no edema      He also has a probable bicuspid aortic valve with mild stenosis;    DATE TEST EF   4/18 Echo  55-60% AS mild  3/19 Echo   30.35 %   7/20 Echo   20.25 % AoV bicuspid  No AS  Mod AI         Date Cr K Hgb  8/20 1.29 4.3 14.2               Past Medical History:  Diagnosis Date  . Arthritis   . Biatrial enlargement    severe  . Bicuspid aortic valve   . CHF (congestive heart failure) ([place])    JULY 2014  . Chronic renal insufficiency    not aware  . GERD (gastroesophageal reflux disease)   . History of cardioversion 12/16/2012  . History of stomach ulcers   . Hypertension   . Left bundle branch block   . Nonischemic cardiomyopathy ([place])       . Persistent atrial fibrillation ([place])    multiple prior cardioversion  . Sinus bradycardia   . Status post clamping of cerebral aneurysm 80    Past Surgical History:  Procedure Laterality Date  . ATRIAL FIBRILLATION ABLATION N/A 10/06/2017   Procedure: ATRIAL FIBRILLATION ABLATION;  Surgeon: [name], MD;  Location: [place] CV LAB;  Service: Cardiovascular;  Laterality: N/A;  . AV NODE ABLATION N/A 02/18/2019   Procedure: AV NODE  ABLATION;  Surgeon: Deboraha Sprang, MD;  Location: [place] [facility];  Service: Cardiovascular;  Laterality: N/A;  . BIOPSY  02/06/2018   Procedure: BIOPSY;  Surgeon: Lavena Bullion, DO;  Location: [place] ENDOSCOPY;  Service: Gastroenterology;;  . BIOPSY  02/28/2018   Procedure: BIOPSY;  Surgeon: Mauri Pole, MD;  Location: [place] ENDOSCOPY;  Service: Endoscopy;;  . BIV PACEMAKER INSERTION CRT-P N/A 01/24/2019   Procedure: BIV PACEMAKER INSERTION CRT-P;  Surgeon: Deboraha Sprang, MD;  Location: [place] [facility];  Service: Cardiovascular;  Laterality: N/A;  . CARDIAC CATHETERIZATION     not awaer of this  . CARDIOVERSION N/A 12/16/2012   Procedure: CARDIOVERSION;  Surgeon: Lelon Perla, MD;  Location: [facility] ENDOSCOPY;  Service: Cardiovascular;  Laterality: N/A;  . CARDIOVERSION N/A 08/14/2017   Procedure: CARDIOVERSION;  Surgeon: Josue Hector, MD;  Location: [facility] ENDOSCOPY;  Service: Cardiovascular;  Laterality: N/A;  . CARDIOVERSION N/A 09/21/2017   Procedure: CARDIOVERSION;  Surgeon: Sanda , MD;  Location: [place] ENDOSCOPY;  Service: Cardiovascular;  Laterality: N/A;  . CARDIOVERSION N/A 10/13/2017   Procedure: CARDIOVERSION;  Surgeon: Josue Hector, MD;  Location: [facility] ENDOSCOPY;  Service: Cardiovascular;  Laterality: N/A;  . CARDIOVERSION N/A 07/05/2018   Procedure: CARDIOVERSION;  Surgeon: Larey Dresser, MD;  Location: [place];  Service: Cardiovascular;  Laterality:  N/A;  . CATARACT EXTRACTION  05/2015   bilateral  . cerebral anuersym post clips    . ESOPHAGOGASTRODUODENOSCOPY N/A 02/06/2018   Procedure: ESOPHAGOGASTRODUODENOSCOPY (EGD);  Surgeon: Lavena Bullion, DO;  Location: [facility] ENDOSCOPY;  Service: Gastroenterology;  Laterality: N/A;  . ESOPHAGOGASTRODUODENOSCOPY N/A 02/28/2018   Procedure: ESOPHAGOGASTRODUODENOSCOPY (EGD);  Surgeon: Mauri Pole, MD;  Location: [facility] ENDOSCOPY;  Service: Endoscopy;  Laterality: N/A;  . fractured left arm    . HERNIA REPAIR      lft  . INGUINAL HERNIA REPAIR  07/02/2011   Procedure: LAPAROSCOPIC INGUINAL HERNIA;  Surgeon: Harl Bowie, MD;  Location: [place];  Service: General;  Laterality: Left;  Laparoscopic left inguinal hernia repair and mesh  . INGUINAL HERNIA REPAIR Left 11/24/2018   Procedure: OPEN LEFT INGUINAL HERNIA REPAIR WITH MESH;  Surgeon: Coralie Keens, MD;  Location: [place];  Service: General;  Laterality: Left;  TAP BLOCK  . left tendon repair     lft foot  . ROTATOR CUFF REPAIR     lf  . TEE WITHOUT CARDIOVERSION N/A 08/14/2017   Procedure: TRANSESOPHAGEAL ECHOCARDIOGRAM (TEE);  Surgeon: Josue Hector, MD;  Location: [facility] ENDOSCOPY;  Service: Cardiovascular;  Laterality: N/A;  . TEE WITHOUT CARDIOVERSION N/A 07/05/2018   Procedure: TRANSESOPHAGEAL ECHOCARDIOGRAM (TEE);  Surgeon: Larey Dresser, MD;  Location: [facility] ENDOSCOPY;  Service: Cardiovascular;  Laterality: N/A;  . TONSILLECTOMY    . TOTAL KNEE ARTHROPLASTY Bilateral 02/06/2014   Procedure: TOTAL KNEE BILATERAL;  Surgeon: Mauri Pole, MD;  Location: [facility];  Service: Orthopedics;  Laterality: Bilateral;  . WRIST GANGLION EXCISION     lft    Current Outpatient Medications  Medication Sig Dispense Refill  . acetaminophen (TYLENOL) 500 MG tablet Take 1,000 mg by mouth every 8 (eight) hours as needed for moderate pain or headache.    . carvedilol (COREG) 25 MG tablet Take 25 mg by mouth 2 (two) times daily with a meal.    . digoxin (LANOXIN) 0.125 MG tablet Take 1 tablet (0.125 mg total) by mouth daily. 90 tablet 3  . Evolocumab (REPATHA SURECLICK) [id number] MG/ML SOAJ Inject 140 mg into the skin every 14 (fourteen) days. Every other Wednesday    . finasteride (PROPECIA) 1 MG tablet Take 1 mg by mouth daily.    Marland Kitchen losartan (COZAAR) 25 MG tablet Take 1 tablet (25 mg total) by mouth every evening. 90 tablet 3  . Multiple Vitamins-Minerals (PRESERVISION AREDS 2+MULTI VIT PO) Take 1 capsule by mouth 2 (two) times daily.    . pantoprazole (PROTONIX)  40 MG tablet Take 1 tablet (40 mg total) by mouth 2 (two) times daily. 180 tablet 3  . spironolactone (ALDACTONE) 25 MG tablet Take 1 tablet (25 mg total) by mouth every evening. 45 tablet 3  . temazepam (RESTORIL) 15 MG capsule Take 15 mg by mouth at bedtime as needed for sleep.    Marland Kitchen torsemide (DEMADEX) 20 MG tablet Take 40 mg by mouth daily.    . traMADol (ULTRAM) 50 MG tablet TAKE 1 TABLET BY MOUTH THREE TIMES DAILY AS NEEDED FOR MODERATE OR SEVERE ARTHRITIS PAIN IN HANDS AND NECK 90 tablet 3  . XARELTO 15 MG TABS tablet TAKE 1 TABLET(15 MG) BY MOUTH DAILY WITH SUPPER 60 tablet 3   No current facility-administered medications for this visit.     No Known Allergies  Review of Systems negative except from HPI and PMH  Physical Exam BP 120/82   Pulse 67  Ht 5\' 8"  (1.727 m)   Wt 171 lb 9.6 oz (77.8 kg)   SpO2 98%   BMI 26.09 kg/m  Well developed and well nourished in no acute distress HENT normal Neck supple with JVP-flat Clear Device pocket well healed; without hematoma or erythema.  There is no tethering  Regular rate and rhythm, no  gallop No murmur Abd-soft with active BS No Clubbing cyanosis   edema Skin-warm and dry A & Oriented  Grossly normal sensory and motor function     ECG   Upright QRS V1 and upright lead 1  Assessment and  Plan  Atrial fibrillation- permanent   Cardiomyopathy-tachycardia mediated-recurrent   Bicuspid aortic valve-    Hypertension   Renal insufficiency  Left bundle branch block  Complete Heart Block s/p AV ablation  Pacemaker CRT-P  [employer]tr[employer]   Much improved with AV ablation and CRT-P Will reprogram to Can-2 from Can -3 to improve longevity Check ECG to look for morphology in lead 1 as 1-2  Q-LV was considerably shorter than 3-4  Will plan recheck echo in 1 month

## 2019-04-12 ENCOUNTER — Encounter (HOSPITAL_COMMUNITY): Payer: Medicare Other | Admitting: Cardiology

## 2019-04-19 ENCOUNTER — Other Ambulatory Visit: Payer: Self-pay

## 2019-04-19 MED ORDER — FINASTERIDE 1 MG PO TABS: 1.0000 mg | ORAL_TABLET | Freq: Every day | ORAL | 5 refills | Status: DC

## 2019-04-25 ENCOUNTER — Ambulatory Visit (INDEPENDENT_AMBULATORY_CARE_PROVIDER_SITE_OTHER): Payer: Medicare Other | Admitting: *Deleted

## 2019-04-25 DIAGNOSIS — I420 Dilated cardiomyopathy: Secondary | ICD-10-CM | POA: Diagnosis not present

## 2019-04-25 LAB — CUP PACEART REMOTE DEVICE CHECK
Battery Remaining Longevity: 92 mo
Battery Voltage: 3.05 V
Brady Statistic AP VP Percent: 0 %
Brady Statistic AP VS Percent: 0 %
Brady Statistic AS VP Percent: 96.29 %
Brady Statistic AS VS Percent: 3.71 %
Brady Statistic RA Percent Paced: 0 %
Brady Statistic RV Percent Paced: 96.29 %
Date Time Interrogation Session: 20201129215052
Implantable Lead Implant Date: 20200831
Implantable Lead Implant Date: 20200831
Implantable Lead Location: 753858
Implantable Lead Location: 753860
Implantable Lead Model: 5076
Implantable Pulse Generator Implant Date: 20200831
Lead Channel Impedance Value: 1026 Ohm
Lead Channel Impedance Value: 1083 Ohm
Lead Channel Impedance Value: 1140 Ohm
Lead Channel Impedance Value: 1197 Ohm
Lead Channel Impedance Value: 1254 Ohm
Lead Channel Impedance Value: 1273 Ohm
Lead Channel Impedance Value: 3420 Ohm
Lead Channel Impedance Value: 3420 Ohm
Lead Channel Impedance Value: 399 Ohm
Lead Channel Impedance Value: 513 Ohm
Lead Channel Impedance Value: 570 Ohm
Lead Channel Impedance Value: 646 Ohm
Lead Channel Impedance Value: 665 Ohm
Lead Channel Impedance Value: 741 Ohm
Lead Channel Pacing Threshold Amplitude: 0.75 V
Lead Channel Pacing Threshold Amplitude: 2.75 V
Lead Channel Pacing Threshold Pulse Width: 0.4 ms
Lead Channel Pacing Threshold Pulse Width: 1.2 ms
Lead Channel Sensing Intrinsic Amplitude: 10.5 mV
Lead Channel Sensing Intrinsic Amplitude: 10.5 mV
Lead Channel Setting Pacing Amplitude: 2.5 V
Lead Channel Setting Pacing Amplitude: 2.5 V
Lead Channel Setting Pacing Pulse Width: 0.4 ms
Lead Channel Setting Pacing Pulse Width: 1.2 ms
Lead Channel Setting Sensing Sensitivity: 2.8 mV

## 2019-04-28 ENCOUNTER — Encounter: Payer: Medicare Other | Admitting: Internal Medicine

## 2019-05-11 ENCOUNTER — Telehealth (HOSPITAL_COMMUNITY): Payer: Self-pay

## 2019-05-11 NOTE — Telephone Encounter (Signed)

## 2019-05-12 ENCOUNTER — Other Ambulatory Visit: Payer: Self-pay

## 2019-05-12 ENCOUNTER — Encounter (HOSPITAL_COMMUNITY): Payer: Self-pay | Admitting: Cardiology

## 2019-05-12 ENCOUNTER — Ambulatory Visit (HOSPITAL_COMMUNITY)
Admission: RE | Admit: 2019-05-12 | Discharge: 2019-05-12 | Disposition: A | Discharge status: Home / Self Care | Payer: Medicare Other | Source: Ambulatory Visit | Attending: Cardiology | Admitting: Cardiology

## 2019-05-12 VITALS — BP 119/86 | HR 73 | Wt 169.4 lb

## 2019-05-12 DIAGNOSIS — Z87891 Personal history of nicotine dependence: Secondary | ICD-10-CM | POA: Diagnosis not present

## 2019-05-12 DIAGNOSIS — Z823 Family history of stroke: Secondary | ICD-10-CM | POA: Insufficient documentation

## 2019-05-12 DIAGNOSIS — I6522 Occlusion and stenosis of left carotid artery: Secondary | ICD-10-CM | POA: Insufficient documentation

## 2019-05-12 DIAGNOSIS — I2510 Atherosclerotic heart disease of native coronary artery without angina pectoris: Secondary | ICD-10-CM | POA: Insufficient documentation

## 2019-05-12 DIAGNOSIS — Z79899 Other long term (current) drug therapy: Secondary | ICD-10-CM | POA: Diagnosis not present

## 2019-05-12 DIAGNOSIS — Z7901 Long term (current) use of anticoagulants: Secondary | ICD-10-CM | POA: Diagnosis not present

## 2019-05-12 DIAGNOSIS — I4819 Other persistent atrial fibrillation: Secondary | ICD-10-CM

## 2019-05-12 DIAGNOSIS — N183 Chronic kidney disease, stage 3 unspecified: Secondary | ICD-10-CM | POA: Insufficient documentation

## 2019-05-12 DIAGNOSIS — I082 Rheumatic disorders of both aortic and tricuspid valves: Secondary | ICD-10-CM | POA: Diagnosis not present

## 2019-05-12 DIAGNOSIS — E785 Hyperlipidemia, unspecified: Secondary | ICD-10-CM | POA: Diagnosis not present

## 2019-05-12 DIAGNOSIS — I5022 Chronic systolic (congestive) heart failure: Secondary | ICD-10-CM | POA: Insufficient documentation

## 2019-05-12 DIAGNOSIS — I447 Left bundle-branch block, unspecified: Secondary | ICD-10-CM | POA: Diagnosis not present

## 2019-05-12 DIAGNOSIS — Z801 Family history of malignant neoplasm of trachea, bronchus and lung: Secondary | ICD-10-CM | POA: Diagnosis not present

## 2019-05-12 DIAGNOSIS — R9431 Abnormal electrocardiogram [ECG] [EKG]: Secondary | ICD-10-CM | POA: Diagnosis not present

## 2019-05-12 DIAGNOSIS — I130 Hypertensive heart and chronic kidney disease with heart failure and stage 1 through stage 4 chronic kidney disease, or unspecified chronic kidney disease: Secondary | ICD-10-CM | POA: Insufficient documentation

## 2019-05-12 LAB — CBC
HCT: 42.1 % (ref 39.0–52.0)
Hemoglobin: 14.2 g/dL (ref 13.0–17.0)
MCH: 34.9 pg — ABNORMAL HIGH (ref 26.0–34.0)
MCHC: 33.7 g/dL (ref 30.0–36.0)
MCV: 103.4 fL — ABNORMAL HIGH (ref 80.0–100.0)
Platelets: 174 10*3/uL (ref 150–400)
RBC: 4.07 MIL/uL — ABNORMAL LOW (ref 4.22–5.81)
RDW: 14.1 % (ref 11.5–15.5)
WBC: 6.9 10*3/uL (ref 4.0–10.5)
nRBC: 0 % (ref 0.0–0.2)

## 2019-05-12 LAB — BASIC METABOLIC PANEL
Anion gap: 12 (ref 5–15)
BUN: 31 mg/dL — ABNORMAL HIGH (ref 8–23)
CO2: 24 mmol/L (ref 22–32)
Calcium: 8.9 mg/dL (ref 8.9–10.3)
Chloride: 97 mmol/L — ABNORMAL LOW (ref 98–111)
Creatinine, Ser: 1.42 mg/dL — ABNORMAL HIGH (ref 0.61–1.24)
GFR calc Af Amer: 54 mL/min — ABNORMAL LOW (ref >60)
GFR calc non Af Amer: 47 mL/min — ABNORMAL LOW (ref >60)
Glucose, Bld: 125 mg/dL — ABNORMAL HIGH (ref 70–99)
Potassium: 4.6 mmol/L (ref 3.5–5.1)
Sodium: 133 mmol/L — ABNORMAL LOW (ref 135–145)

## 2019-05-12 LAB — DIGOXIN LEVEL: Digoxin Level: 0.7 ng/mL — ABNORMAL LOW (ref 0.8–2.0)

## 2019-05-12 MED ORDER — TORSEMIDE 20 MG PO TABS: 20.0000 mg | ORAL_TABLET | Freq: Every day | ORAL | 3 refills | Status: DC

## 2019-05-12 MED ORDER — ENTRESTO 24-26 MG PO TABS: 1.0000 | ORAL_TABLET | Freq: Two times a day (BID) | ORAL | 5 refills | Status: DC

## 2019-05-12 NOTE — Patient Instructions (Addendum)
Labs done today. We will contact you only if your labs are abnormal.  DISCONTINUE Losartan  START Entresto 24-26mg (1 tab) by mouth two times daily.  DECREASE Torsemide 20mg ( 1 tab) by mouth daily.  Your physician recommends that you schedule a follow-up appointment in: 10 days for a lab only appointment and in 2 months for an appointment with [name] with an Echo prior to your appointment.   Your physician has requested that you have an echocardiogram. Echocardiography is a painless test that uses sound waves to create images of your heart. It provides your doctor with information about the size and shape of your heart and how well your heart's chambers and valves are working. This procedure takes approximately one hour. There are no restrictions for this procedure.  At the [facility], you and your health needs are our priority. As part of our continuing mission to provide you with exceptional heart care, we have created designated Provider Care Teams. These Care Teams include your primary Cardiologist (physician) and Advanced Practice Providers (APPs- Physician Assistants and Nurse Practitioners) who all work together to provide you with the care you need, when you need it.   You may see any of the following providers on your designated Care Team at your next follow up: Marland Kitchen Dr Glori Bickers . Dr [name] . Darrick Grinder, NP . Lyda Jester, PA . Audry Riles, PharmD   Please be sure to bring in all your medications bottles to every appointment.

## 2019-05-13 NOTE — Progress Notes (Signed)
Date:  05/13/2019   ID:  [name], DOB 1941/02/25, MRN [id number]   Provider location: [facility] Type of Visit: Established patient   PCP:  [name], MD  Cardiologist: [name] EP: [name]/[name]   History of Present Illness: JAIS RUBI is a 78 y.o. 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   Pt failed DCCV 08/14/17, 08/31/17, and 09/17/17. Failed Tikosyn. Did have conversion with Amiodarone and DCCV. Pt underwent successful ablation 10/07/17.  However, he had ERAF.  Admitted 5/18 - 10/14/17 with atrial fibrillation/RVR and acute on chronic systolic CHF.  Echo showed EF 30-35%. Diuresed and reloaded with Amio. Underwent successful DCCV 10/13/17.    Echo in 9/19 showed improvement in EF to 50% in NSR.   In 9/19, patient reported profound fatigue.  He was volume overloaded on exam and diuretic was increased.  CBC was done, hgb was low and patient ended up going to the ER for admission.  He was diagnosed with UGIB likely from duodenal ulcer, probably due to meloxicam.  This was stopped.   Again in 10/19, he was profoundly fatigued.  CBC showed hgb 6.7, he was sent to the ER where he had 2 unit transfusion and EGD was done again, showing duodenal stenosis.  I also stopped his atorvastatin.   He went back into atrial fibrillation and had successful TEE-DCCV in 2/20.  TEE showed EF 35-40% with mildly decreased RV systolic function, EF back down in setting of atrial fibrillation.  In 3/20, he was back in atrial fibrillation and has been in atrial fibrillation since then.  He saw [name] and it was decided not to pursue redo ablation.  Repeat echo in 7/20 showed EF 20-25%, moderately decreased RV systolic function, moderate-severe TR,  bicuspid aortic valve with moderate AI, dilated IVC.  Due to permanent atrial fibrillation and rapid rate at times with LBBB, it was decided to pursue AV nodal ablation with BiV pacing. He had Medtronic CRT-P placement in 8/20, then AV nodal ablation in 9/20.   Patient returns for followup of CHF and atrial fibrillation.  He remains in atrial fibrillation with BiV pacing s/p AV nodal ablation. He is feeling much better overall.  No significant exertional dyspnea.  He has been exercising at the gym. No orthopnea/PND.  No BRBPR/melena.    Labs (3/19): LDL 65 Labs (8/19): K 5.2 => 4.1, creatinine 1.6 Labs (9/19): K 3.9, creatinine 1.78, hgb 9.6, TSH normal.  Labs (10/19): hgb 6.7 => 11.8, LFTs normal, K 3.6, creatinine 1.3 Labs (11/19): LFTs, TSH normal Labs (1/20): K 4, creatinine 1.15, hgb 14.5 Labs (2/20): Hgb 13.4 Labs (4/20): LFTs normal, TSH elevated Labs (6/20): LDL 38, HDL 64, K 4.2, creatinine 1.45, hgb 14.7 Labs (7/20): K 4.4, creatinine 1.43, TSH and free T3 normal, mildly elevated free T4, LFTs normal Labs (8/20): K 4.9, creatinine 1.26, digoxin 0.9 Labs (9/20): K 4.3, creatinine 1.44  ECG (personally reviewed): atrial fibrillation with BiV pacing.   Medtronic device interrogation: No AF/VT, stable thoracic impedance.   Past Medical History 1. Chronic systolic CHF: Possible tachy-mediated cardiomyopathy.    - Echo(08/13/2017): LVEF 30-35%, moderate AS, mild AI, severe LAE, RV mild dilated with systolic function mild/mod reduced, PA peak pressure 46 mm Hg. - Echo (9/19): EF  50%, mild diffuse hypokinesis, normal RV size and systolic function, PASP 70 mmHg, moderate AS, moderate AI, moderate MR.  - TEE (2/20): EF 35-40%, mild-moderate LV dilation, normal RV size with mildly decreased systolic function, mil MR, trileaflet aortic valve with mild AS (mean gradient 10 mmHg, AVA 1.7 cm^2), moderate AI.  - Echo (7/20): EF 20-25%, moderately decreased RV systolic function, moderate-severe  TR, bicuspid aortic valve with moderate AI, dilated IVC.  - Medtronic CRT-P placement 8/20.  2. Atrial fibrillation: Has been difficult to control.  Failed Tikosyn and multiple DCCVs.  Failed amiodarone prior to ablation.  Atrial fibrillation ablation but then had ERAF.  He was cardioverted again in 5/19 with return to NSR.  DCCV again in 2/20 but back in atrial fibrillation in 3/20.  3. Aortic valve disorder: TEE 2/20 with mild AS, moderate AI (valve is not bicuspid).   4. HTN 5. Hyperlipidemia: Severe fatigue with atorvastatin, myalgias with Crestor.  6. Mitral regurgitation: Mild on last echo.  7. CAD: coronary calcium noted on chest CT.  8. CKD: Stage 3.  9. Carotid stenosis: Carotid dopplers (123XX123) with 123456 LICA stenosis.  - Carotid dopplers ([id number]): 123456 LICA stenosis.  10. Chronic LBBB 11. PUD: UGIB in 9/19 with duodenal ulcer probably due to meloxicam use.  - EGD (10/19): Duodenal stenosis.  12. Inguinal hernia  Current Outpatient Medications  Medication Sig Dispense Refill  . acetaminophen (TYLENOL) 500 MG tablet Take 1,000 mg by mouth every 8 (eight) hours as needed for moderate pain or headache.    . carvedilol (COREG) 25 MG tablet Take 25 mg by mouth 2 (two) times daily with a meal.    . digoxin (LANOXIN) 0.125 MG tablet Take 1 tablet (0.125 mg total) by mouth daily. 90 tablet 3  . Evolocumab (REPATHA SURECLICK) XX123456 MG/ML SOAJ Inject 140 mg into the skin every 14 (fourteen) days. Every other Wednesday    . finasteride (PROPECIA) 1 MG tablet Take 1 tablet (1 mg total) by mouth daily. 30 tablet 5  . Multiple Vitamins-Minerals (PRESERVISION AREDS 2+MULTI VIT PO) Take 1 capsule by mouth 2 (two) times daily.    . pantoprazole (PROTONIX) 40 MG tablet Take 1 tablet (40 mg total) by mouth 2 (two) times daily. 180 tablet 3  . spironolactone (ALDACTONE) 25 MG tablet Take 1 tablet (25 mg total) by mouth every evening. 45 tablet 3  . temazepam (RESTORIL) 15 MG capsule Take 15 mg by  mouth at bedtime as needed for sleep.    Marland Kitchen torsemide (DEMADEX) 20 MG tablet Take 1 tablet (20 mg total) by mouth daily. 90 tablet 3  . traMADol (ULTRAM) 50 MG tablet TAKE 1 TABLET BY MOUTH THREE TIMES DAILY AS NEEDED FOR MODERATE OR SEVERE ARTHRITIS PAIN IN HANDS AND NECK 90 tablet 3  . XARELTO 15 MG TABS tablet TAKE 1 TABLET(15 MG) BY MOUTH DAILY WITH SUPPER 60 tablet 3  . sacubitril-valsartan (ENTRESTO) 24-26 MG Take 1 tablet by mouth 2 (two) times daily. 60 tablet 5   No current facility-administered medications for this encounter.    Allergies:   Patient has no known allergies.   Social History:  The patient  reports that he quit smoking about 32 years ago. He has a 37.50 pack-year smoking history. He has never used smokeless tobacco. He reports previous alcohol use of about 1.0 standard drinks of alcohol per week. He reports that he does not use drugs.   Family History:  The patient's family history includes Lung cancer (age  of onset: 48) in his mother; Stroke in his brother and mother; Stroke (age of onset: 37) in his father.   ROS:  Please see the history of present illness.   All other systems are personally reviewed and negative.   Exam:   BP 119/86   Pulse 73   Wt 76.8 kg (169 lb 6.4 oz)   SpO2 96%   BMI 25.76 kg/m  General: NAD Neck: No JVD, no thyromegaly or thyroid nodule.  Lungs: Clear to auscultation bilaterally with normal respiratory effort. CV: Nondisplaced PMI.  Heart regular S1/S2, no S3/S4, no murmur.  No peripheral edema.  No carotid bruit.  Normal pedal pulses.  Abdomen: Soft, nontender, no hepatosplenomegaly, no distention.  Skin: Intact without lesions or rashes.  Neurologic: Alert and oriented x 3.  Psych: Normal affect. Extremities: No clubbing or cyanosis.  HEENT: Normal.     Recent Labs: 12/06/2018: ALT 15 12/21/2018: TSH 4.279 05/12/2019: BUN 31; Creatinine, Ser 1.42; Hemoglobin 14.2; Platelets 174; Potassium 4.6; Sodium 133  Personally reviewed    Wt Readings from Last 3 Encounters:  05/12/19 76.8 kg (169 lb 6.4 oz)  04/11/19 77.8 kg (171 lb 9.6 oz)  03/01/19 75.8 kg (167 lb)      ASSESSMENT AND PLAN:  1. Chronic systolic CHF: Echo [id number] with EF 30-35%, mild to moderately decreased RV systolic function in setting of atrial fibrillation with RVR.  Echo in 9/19 with patient in NSR showed EF up to 50%, mild diffuse hypokinesis.  Back in atrial fibrillation in 2/20, TEE showed EF 35-40%.  Suspect he has atrial fibrillation/tachycardia-mediated cardiomyopathy given fall in EF with recurrent atrial fibrillation.  Most recent echo in 7/20 with EF 20-25%, moderately decreased RV systolic function.  He is now s/p Medtronic CRT-P placement with AV nodal ablation. NYHA class II symptoms, feeling better overall.  He is not volume overloaded on exam. - Stop losartan, start Entresto 24/26 bid.  Decrease torsemide to 20 mg daily.  BMET today and repeat in 10 days.   - Continue digoxin 0.125, check level today.  - Continue Coreg 25 mg bid.     - Continue spironolactone 25 mg daily.     2. Atrial fibrillation: Persistent.  Difficult to control rate and rhythm. He failed Tikosyn. He started on amiodarone, then had atrial fibrillation ablation in 5/19 with ERAF, requiring DCCV.  Most recently cardioverted in 2/20 but back in atrial fibrillation by 3/20 and has stayed in atrial fibrillation since.  He saw [name] and they decided against redo ablation. He is now s/p AV nodal ablation.    - He is off amiodarone given permanent atrial fibrillation.   - Continue Xarelto 15 mg daily.  3. CAD: Extensive coronary calcification on last CT chest. No chest pain. I suspect that his cardiomyopathy is tachycardia-mediated/atrial fibrillation-related given rise in EF in NSR and then fall when back in atrial fibrillation rather than ischemic cardiomyopathy.  - No ASA with Xarelto use.  - Unable to tolerate statins.  He will continue Repatha, good LDL on last lipids  check.   4. CKD: Stage 3. BMET today.  5. Aortic valve disease: TEE in 2/20 showed that aortic valve was not bicuspid (though TTE reports have read bicuspid) but did have mild stenosis and moderate AI.  Moderate AI on 7/20 echo.  6. Tricuspid regurgitation: Moderate-severe on 7/20 echo.   7. LBBB: Chronic, now s/p CRT.  8. Hyperlipidemia: Unable to tolerate statins.  Now on Repatha.  Good lipids in  5/20.  9. Carotid stenosis: Repeat carotid dopplers in 7/21.    Recommended follow-up:  2 months with echo  Signed, [name], MD  05/13/2019  [facility] [address] [facility] [place] [address] [phone] (office) [phone] (fax)

## 2019-05-18 NOTE — Progress Notes (Signed)
PPM remote 

## 2019-05-19 ENCOUNTER — Other Ambulatory Visit: Payer: Self-pay

## 2019-05-19 LAB — CUP PACEART INCLINIC DEVICE CHECK
Battery Remaining Longevity: 93 mo
Battery Voltage: 3.04 V
Brady Statistic AP VP Percent: 0 %
Brady Statistic AP VS Percent: 0 %
Brady Statistic AS VP Percent: 96.6 %
Brady Statistic AS VS Percent: 3.4 %
Brady Statistic RA Percent Paced: 0 %
Brady Statistic RV Percent Paced: 96.6 %
Date Time Interrogation Session: 20201116125700
Implantable Lead Implant Date: 20200831
Implantable Lead Implant Date: 20200831
Implantable Lead Location: 753858
Implantable Lead Location: 753860
Implantable Lead Model: 5076
Implantable Pulse Generator Implant Date: 20200831
Lead Channel Impedance Value: 1121 Ohm
Lead Channel Impedance Value: 1140 Ohm
Lead Channel Impedance Value: 1178 Ohm
Lead Channel Impedance Value: 1197 Ohm
Lead Channel Impedance Value: 1235 Ohm
Lead Channel Impedance Value: 1368 Ohm
Lead Channel Impedance Value: 3420 Ohm
Lead Channel Impedance Value: 3420 Ohm
Lead Channel Impedance Value: 380 Ohm
Lead Channel Impedance Value: 551 Ohm
Lead Channel Impedance Value: 722 Ohm
Lead Channel Pacing Threshold Amplitude: 0.75 V
Lead Channel Pacing Threshold Amplitude: 2 V
Lead Channel Pacing Threshold Pulse Width: 0.4 ms
Lead Channel Pacing Threshold Pulse Width: 1 ms
Lead Channel Setting Pacing Amplitude: 2.5 V
Lead Channel Setting Pacing Amplitude: 2.5 V
Lead Channel Setting Pacing Pulse Width: 0.4 ms
Lead Channel Setting Pacing Pulse Width: 1.2 ms
Lead Channel Setting Sensing Sensitivity: 2.8 mV

## 2019-05-19 MED ORDER — TEMAZEPAM 15 MG PO CAPS: 15.0000 mg | ORAL_CAPSULE | Freq: Every evening | ORAL | 5 refills | Status: DC | PRN

## 2019-05-19 NOTE — Progress Notes (Signed)
I refilled this-he would let you please show you a few ways to look at refills today that might give a different perspective

## 2019-05-23 ENCOUNTER — Telehealth: Payer: Self-pay

## 2019-05-23 NOTE — Telephone Encounter (Signed)
Prior auth requested and form received. Have been filled out and faxed to insurance

## 2019-05-24 ENCOUNTER — Other Ambulatory Visit: Payer: Self-pay | Admitting: Pharmacist

## 2019-05-24 ENCOUNTER — Ambulatory Visit (HOSPITAL_COMMUNITY)
Admission: RE | Admit: 2019-05-24 | Discharge: 2019-05-24 | Disposition: A | Discharge status: Home / Self Care | Payer: Medicare Other | Source: Ambulatory Visit | Attending: Cardiology | Admitting: Cardiology

## 2019-05-24 ENCOUNTER — Other Ambulatory Visit: Payer: Self-pay

## 2019-05-24 DIAGNOSIS — I5022 Chronic systolic (congestive) heart failure: Secondary | ICD-10-CM | POA: Insufficient documentation

## 2019-05-24 LAB — BASIC METABOLIC PANEL
Anion gap: 9 (ref 5–15)
BUN: 24 mg/dL — ABNORMAL HIGH (ref 8–23)
CO2: 25 mmol/L (ref 22–32)
Calcium: 8.9 mg/dL (ref 8.9–10.3)
Chloride: 100 mmol/L (ref 98–111)
Creatinine, Ser: 1.36 mg/dL — ABNORMAL HIGH (ref 0.61–1.24)
GFR calc Af Amer: 57 mL/min — ABNORMAL LOW (ref >60)
GFR calc non Af Amer: 49 mL/min — ABNORMAL LOW (ref >60)
Glucose, Bld: 113 mg/dL — ABNORMAL HIGH (ref 70–99)
Potassium: 5 mmol/L (ref 3.5–5.1)
Sodium: 134 mmol/L — ABNORMAL LOW (ref 135–145)

## 2019-05-24 MED ORDER — REPATHA SURECLICK 140 MG/ML ~~LOC~~ SOAJ: 140.0000 mg | SUBCUTANEOUS | 11 refills | Status: DC

## 2019-06-01 NOTE — Telephone Encounter (Signed)
auth received from 04/23/2019 to 05/22/2020.  Case ID [id number]

## 2019-06-21 ENCOUNTER — Other Ambulatory Visit: Payer: Self-pay

## 2019-06-21 ENCOUNTER — Telehealth: Payer: Self-pay | Admitting: Family Medicine

## 2019-06-21 MED ORDER — TEMAZEPAM 15 MG PO CAPS: 15.0000 mg | ORAL_CAPSULE | Freq: Every evening | ORAL | 5 refills | Status: DC | PRN

## 2019-06-21 MED ORDER — TRAMADOL HCL 50 MG PO TABS: ORAL_TABLET | ORAL | 3 refills | Status: DC

## 2019-06-21 NOTE — Progress Notes (Signed)
I sent these in  

## 2019-06-21 NOTE — Telephone Encounter (Signed)
Called and spoke with pt and updated pharmacy and sent message to [facility].

## 2019-06-21 NOTE — Telephone Encounter (Signed)
  LAST APPOINTMENT DATE: 05/23/2019   NEXT APPOINTMENT DATE:@Visit  date not found  MEDICATION: traMADol (ULTRAM) 50 MG tablet  temazepam (RESTORIL) 15 MG capsule  PHARMACY: [facility]  [address] [place], [place] [address] [phone]   Patient would like if both [facility] were taken out of his chart and only wants it [facility] to [facility] for now on.  **Let patient know to contact pharmacy at the end of the day to make sure medication is ready. **  ** Please notify patient to allow 48-72 hours to process**

## 2019-06-22 NOTE — Progress Notes (Signed)
Called and let patient know

## 2019-06-24 ENCOUNTER — Ambulatory Visit: Payer: Medicare Other

## 2019-07-02 ENCOUNTER — Ambulatory Visit: Payer: Medicare Other

## 2019-07-15 ENCOUNTER — Ambulatory Visit: Payer: Medicare Other

## 2019-07-17 ENCOUNTER — Ambulatory Visit: Payer: Medicare Other | Attending: Internal Medicine

## 2019-07-17 DIAGNOSIS — Z23 Encounter for immunization: Secondary | ICD-10-CM | POA: Insufficient documentation

## 2019-07-17 NOTE — Progress Notes (Signed)
   Covid-19 Vaccination Clinic  Name:  Travis [name]    MRN: [id number] DOB: 11-30-1940  07/17/2019  [name] was observed post Covid-19 immunization for 15 minutes without incidence. He was provided with Vaccine Information Sheet and instruction to access the V-Safe system.   [name] was instructed to call 911 with any severe reactions post vaccine: Marland Kitchen Difficulty breathing  . Swelling of your face and throat  . A fast heartbeat  . A bad rash all over your body  . Dizziness and weakness    Immunizations Administered    Name Date Dose VIS Date Route   Pfizer COVID-19 Vaccine 07/17/2019  9:01 AM 0.3 mL 05/06/2019 Intramuscular   Manufacturer: [place]   Lot: [id number]   [place]: [id number]

## 2019-07-18 ENCOUNTER — Encounter (HOSPITAL_COMMUNITY): Payer: Self-pay | Admitting: Cardiology

## 2019-07-18 ENCOUNTER — Ambulatory Visit (HOSPITAL_BASED_OUTPATIENT_CLINIC_OR_DEPARTMENT_OTHER)
Admission: RE | Admit: 2019-07-18 | Discharge: 2019-07-18 | Disposition: A | Discharge status: Home / Self Care | Payer: Medicare Other | Source: Ambulatory Visit | Attending: Cardiology | Admitting: Cardiology

## 2019-07-18 ENCOUNTER — Other Ambulatory Visit: Payer: Self-pay

## 2019-07-18 ENCOUNTER — Ambulatory Visit (HOSPITAL_COMMUNITY)
Admission: RE | Admit: 2019-07-18 | Discharge: 2019-07-18 | Disposition: A | Discharge status: Home / Self Care | Payer: Medicare Other | Source: Ambulatory Visit | Attending: Cardiology | Admitting: Cardiology

## 2019-07-18 VITALS — BP 110/78 | HR 68 | Wt 171.8 lb

## 2019-07-18 DIAGNOSIS — I447 Left bundle-branch block, unspecified: Secondary | ICD-10-CM | POA: Insufficient documentation

## 2019-07-18 DIAGNOSIS — N183 Chronic kidney disease, stage 3 unspecified: Secondary | ICD-10-CM | POA: Insufficient documentation

## 2019-07-18 DIAGNOSIS — Z950 Presence of cardiac pacemaker: Secondary | ICD-10-CM | POA: Diagnosis not present

## 2019-07-18 DIAGNOSIS — I4821 Permanent atrial fibrillation: Secondary | ICD-10-CM | POA: Insufficient documentation

## 2019-07-18 DIAGNOSIS — Z8711 Personal history of peptic ulcer disease: Secondary | ICD-10-CM | POA: Insufficient documentation

## 2019-07-18 DIAGNOSIS — Z801 Family history of malignant neoplasm of trachea, bronchus and lung: Secondary | ICD-10-CM | POA: Diagnosis not present

## 2019-07-18 DIAGNOSIS — Z79899 Other long term (current) drug therapy: Secondary | ICD-10-CM | POA: Diagnosis not present

## 2019-07-18 DIAGNOSIS — E785 Hyperlipidemia, unspecified: Secondary | ICD-10-CM | POA: Diagnosis not present

## 2019-07-18 DIAGNOSIS — I5022 Chronic systolic (congestive) heart failure: Secondary | ICD-10-CM | POA: Insufficient documentation

## 2019-07-18 DIAGNOSIS — I6522 Occlusion and stenosis of left carotid artery: Secondary | ICD-10-CM | POA: Diagnosis not present

## 2019-07-18 DIAGNOSIS — I2510 Atherosclerotic heart disease of native coronary artery without angina pectoris: Secondary | ICD-10-CM | POA: Diagnosis not present

## 2019-07-18 DIAGNOSIS — Z87891 Personal history of nicotine dependence: Secondary | ICD-10-CM | POA: Diagnosis not present

## 2019-07-18 DIAGNOSIS — I130 Hypertensive heart and chronic kidney disease with heart failure and stage 1 through stage 4 chronic kidney disease, or unspecified chronic kidney disease: Secondary | ICD-10-CM | POA: Diagnosis not present

## 2019-07-18 DIAGNOSIS — Z823 Family history of stroke: Secondary | ICD-10-CM | POA: Insufficient documentation

## 2019-07-18 DIAGNOSIS — I4819 Other persistent atrial fibrillation: Secondary | ICD-10-CM | POA: Diagnosis not present

## 2019-07-18 DIAGNOSIS — I352 Nonrheumatic aortic (valve) stenosis with insufficiency: Secondary | ICD-10-CM | POA: Diagnosis not present

## 2019-07-18 DIAGNOSIS — Z7901 Long term (current) use of anticoagulants: Secondary | ICD-10-CM | POA: Diagnosis not present

## 2019-07-18 DIAGNOSIS — I429 Cardiomyopathy, unspecified: Secondary | ICD-10-CM | POA: Insufficient documentation

## 2019-07-18 LAB — BASIC METABOLIC PANEL
Anion gap: 11 (ref 5–15)
BUN: 30 mg/dL — ABNORMAL HIGH (ref 8–23)
CO2: 25 mmol/L (ref 22–32)
Calcium: 8.9 mg/dL (ref 8.9–10.3)
Chloride: 100 mmol/L (ref 98–111)
Creatinine, Ser: 1.4 mg/dL — ABNORMAL HIGH (ref 0.61–1.24)
GFR calc Af Amer: 55 mL/min — ABNORMAL LOW (ref >60)
GFR calc non Af Amer: 47 mL/min — ABNORMAL LOW (ref >60)
Glucose, Bld: 123 mg/dL — ABNORMAL HIGH (ref 70–99)
Potassium: 4.7 mmol/L (ref 3.5–5.1)
Sodium: 136 mmol/L (ref 135–145)

## 2019-07-18 LAB — ECHOCARDIOGRAM COMPLETE: Weight: 2748.8 oz

## 2019-07-18 LAB — DIGOXIN LEVEL: Digoxin Level: 0.7 ng/mL — ABNORMAL LOW (ref 0.8–2.0)

## 2019-07-18 MED ORDER — SACUBITRIL-VALSARTAN 49-51 MG PO TABS: 1.0000 | ORAL_TABLET | Freq: Two times a day (BID) | ORAL | 5 refills | Status: DC

## 2019-07-18 NOTE — Progress Notes (Signed)
  Echocardiogram 2D Echocardiogram has been performed.  Travis [name] 07/18/2019, 2:21 PM

## 2019-07-18 NOTE — Patient Instructions (Addendum)
INCREASE Entresto to 49/51mg  (1 tab) twice a day   Labs today We will only contact you if something comes back abnormal or we need to make some changes. Otherwise no news is good news!   REPEAT LABS on Thursday March 4th, 2021 at 11:30am. [place] code [phone]   Your physician recommends that you schedule a follow-up appointment in: 3 months.     You are scheduled to return on Wednesday May 19th, 2021 at 11:20am. Garage code [id number]   At the [facility], you and your health needs are our priority. As part of our continuing mission to provide you with exceptional heart care, we have created designated Provider Care Teams. These Care Teams include your primary Cardiologist (physician) and Advanced Practice Providers (APPs- Physician Assistants and Nurse Practitioners) who all work together to provide you with the care you need, when you need it.   You may see any of the following providers on your designated Care Team at your next follow up: Marland Kitchen Dr Glori Bickers . Dr [name] . Darrick Grinder, NP . Lyda Jester, PA . Audry Riles, PharmD   Please be sure to bring in all your medications bottles to every appointment.

## 2019-07-19 NOTE — Progress Notes (Signed)
Date:  07/19/2019   ID:  Travis [name], DOB 12-Aug-1940, MRN [id number]   Provider location: [facility] Type of Visit: Established patient   PCP:  [name], MD  Cardiologist: [name] EP: [name]/[name]   History of Present Illness: Travis [name] is a 79 y.o. 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   Pt failed DCCV 08/14/17, 08/31/17, and 09/17/17. Failed Tikosyn. Did have conversion with Amiodarone and DCCV. Pt underwent successful ablation 10/07/17.  However, he had ERAF.  Admitted 5/18 - 10/14/17 with atrial fibrillation/RVR and acute on chronic systolic CHF.  Echo showed EF 30-35%. Diuresed and reloaded with Amio. Underwent successful DCCV 10/13/17.    Echo in 9/19 showed improvement in EF to 50% in NSR.   In 9/19, patient reported profound fatigue.  He was volume overloaded on exam and diuretic was increased.  CBC was done, hgb was low and patient ended up going to the ER for admission.  He was diagnosed with UGIB likely from duodenal ulcer, probably due to meloxicam.  This was stopped.   Again in 10/19, he was profoundly fatigued.  CBC showed hgb 6.7, he was sent to the ER where he had 2 unit transfusion and EGD was done again, showing duodenal stenosis.  I also stopped his atorvastatin.   He went back into atrial fibrillation and had successful TEE-DCCV in 2/20.  TEE showed EF 35-40% with mildly decreased RV systolic function, EF back down in setting of atrial fibrillation.  In 3/20, he was back in atrial fibrillation and has been in atrial fibrillation since then.  He saw [name] and it was decided not to pursue redo ablation.  Repeat echo in 7/20 showed EF 20-25%, moderately decreased RV systolic function, moderate-severe TR,  bicuspid aortic valve with moderate AI, dilated IVC.  Due to permanent atrial fibrillation and rapid rate at times with LBBB, it was decided to pursue AV nodal ablation with BiV pacing. He had Medtronic CRT-P placement in 8/20, then AV nodal ablation in 9/20.   Echo was done today and reviewed, EF 35-40% with global hypokinesis, mildly decreased RV systolic function, mild MR, mild-moderate AI with moderate AS.   Patient returns for followup of CHF and atrial fibrillation.  He remains in atrial fibrillation with BiV pacing s/p AV nodal ablation.  He feels better overall.  No significant exertional dyspnea.  He exercises regularly. No orthopnea/PND. No chest pain.  No lightheadedness.  Weight stable.   Labs (3/19): LDL 65 Labs (8/19): K 5.2 => 4.1, creatinine 1.6 Labs (9/19): K 3.9, creatinine 1.78, hgb 9.6, TSH normal.  Labs (10/19): hgb 6.7 => 11.8, LFTs normal, K 3.6, creatinine 1.3 Labs (11/19): LFTs, TSH normal Labs (1/20): K 4, creatinine 1.15, hgb 14.5 Labs (2/20): Hgb 13.4 Labs (4/20): LFTs normal, TSH elevated Labs (6/20): LDL 38, HDL 64, K 4.2, creatinine 1.45, hgb 14.7 Labs (7/20): K 4.4, creatinine 1.43, TSH and free T3 normal, mildly elevated free T4, LFTs normal Labs (8/20): K 4.9, creatinine 1.26, digoxin 0.9 Labs (9/20): K 4.3, creatinine 1.44 Labs (12/20): digoxin 0.7, K 5, creatinine 1.36  ECG (personally reviewed): atrial fibrillation with BiV pacing.   Medtronic device interrogation: Fluid index > threshold with mildly decreased thoracic impedance (has been like this > 1  month).  CRT effective pacing was only 69%.  I discussed with Medtronic rep and LV lead output was increased with significantly improved LV pacing percentage.   Past Medical History 1. Chronic systolic CHF: Possible tachy-mediated cardiomyopathy.    - Echo(08/13/2017): LVEF 30-35%, moderate AS, mild AI, severe LAE, RV mild dilated with systolic function mild/mod reduced, PA peak pressure 46 mm Hg. -  Echo (9/19): EF 50%, mild diffuse hypokinesis, normal RV size and systolic function, PASP 70 mmHg, moderate AS, moderate AI, moderate MR.  - TEE (2/20): EF 35-40%, mild-moderate LV dilation, normal RV size with mildly decreased systolic function, mil MR, trileaflet aortic valve with mild AS (mean gradient 10 mmHg, AVA 1.7 cm^2), moderate AI.  - Echo (7/20): EF 20-25%, moderately decreased RV systolic function, moderate-severe TR, bicuspid aortic valve with moderate AI, dilated IVC.  - Medtronic CRT-P placement 8/20.  - AV nodal ablation 9/20 - Echo (2/21): EF 35-40% with global hypokinesis, mildly decreased RV systolic function, mild MR, mild-moderate AI with moderate AS.  2. Atrial fibrillation: Has been difficult to control.  Failed Tikosyn and multiple DCCVs.  Failed amiodarone prior to ablation.  Atrial fibrillation ablation but then had ERAF.  He was cardioverted again in 5/19 with return to NSR.  DCCV again in 2/20 but back in atrial fibrillation in 3/20.  3. Aortic valve disorder: TEE 2/20 with mild AS, moderate AI (valve is not bicuspid).   - Echo 2/21 with mild-moderate AI, moderate AS.  4. HTN 5. Hyperlipidemia: Severe fatigue with atorvastatin, myalgias with Crestor.  6. Mitral regurgitation: Mild on last echo.  7. CAD: coronary calcium noted on chest CT.  8. CKD: Stage 3.  9. Carotid stenosis: Carotid dopplers (123XX123) with 123456 LICA stenosis.  - Carotid dopplers ([id number]): 123456 LICA stenosis.  10. Chronic LBBB 11. PUD: UGIB in 9/19 with duodenal ulcer probably due to meloxicam use.  - EGD (10/19): Duodenal stenosis.  12. Inguinal hernia  Current Outpatient Medications  Medication Sig Dispense Refill  . acetaminophen (TYLENOL) 500 MG tablet Take 1,000 mg by mouth every 8 (eight) hours as needed for moderate pain or headache.    . carvedilol (COREG) 25 MG tablet Take 25 mg by mouth 2 (two) times daily with a meal.    . digoxin (LANOXIN) 0.125 MG tablet Take 1 tablet (0.125 mg  total) by mouth daily. 90 tablet 3  . Evolocumab (REPATHA SURECLICK) XX123456 MG/ML SOAJ Inject 140 mg into the skin every 14 (fourteen) days. Every other Wednesday 2 pen 11  . finasteride (PROPECIA) 1 MG tablet Take 1 tablet (1 mg total) by mouth daily. 30 tablet 5  . mupirocin ointment (BACTROBAN) 2 % APPLY EXTERNALLY TO FACE EVERY DAY AS NEEDED    . pantoprazole (PROTONIX) 40 MG tablet Take 1 tablet (40 mg total) by mouth 2 (two) times daily. 180 tablet 3  . spironolactone (ALDACTONE) 25 MG tablet Take 12.5 mg by mouth daily.    . temazepam (RESTORIL) 15 MG capsule Take 1 capsule (15 mg total) by mouth at bedtime as needed for sleep. 30 capsule 5  . torsemide (DEMADEX) 20 MG tablet Take 1 tablet (20 mg total) by mouth daily. 90 tablet 3  . traMADol (ULTRAM) 50 MG tablet TAKE 1 TABLET BY MOUTH THREE TIMES DAILY AS NEEDED FOR MODERATE OR SEVERE ARTHRITIS PAIN IN HANDS AND NECK 90 tablet 3  . XARELTO 15 MG TABS tablet TAKE 1 TABLET(15 MG) BY MOUTH DAILY WITH SUPPER 60 tablet 3  .  sacubitril-valsartan (ENTRESTO) 49-51 MG Take 1 tablet by mouth 2 (two) times daily. 60 tablet 5   No current facility-administered medications for this encounter.    Allergies:   Patient has no known allergies.   Social History:  The patient  reports that he quit smoking about 33 years ago. He has a 37.50 pack-year smoking history. He has never used smokeless tobacco. He reports previous alcohol use of about 1.0 standard drinks of alcohol per week. He reports that he does not use drugs.   Family History:  The patient's family history includes Lung cancer (age of onset: 70) in his mother; Stroke in his brother and mother; Stroke (age of onset: 25) in his father.   ROS:  Please see the history of present illness.   All other systems are personally reviewed and negative.   Exam:   BP 110/78   Pulse 68   Wt 77.9 kg (171 lb 12.8 oz)   SpO2 97%   BMI 26.12 kg/m  General: NAD Neck: No JVD, no thyromegaly or thyroid  nodule.  Lungs: Clear to auscultation bilaterally with normal respiratory effort. CV: Nondisplaced PMI.  Heart regular S1/S2, no S3/S4, 2/6 SEM RUSB.  No peripheral edema.  No carotid bruit.  Normal pedal pulses.  Abdomen: Soft, nontender, no hepatosplenomegaly, no distention.  Skin: Intact without lesions or rashes.  Neurologic: Alert and oriented x 3.  Psych: Normal affect. Extremities: No clubbing or cyanosis.  HEENT: Normal.    Recent Labs: 12/06/2018: ALT 15 12/21/2018: TSH 4.279 05/12/2019: Hemoglobin 14.2; Platelets 174 07/18/2019: BUN 30; Creatinine, Ser 1.40; Potassium 4.7; Sodium 136  Personally reviewed   Wt Readings from Last 3 Encounters:  07/18/19 77.9 kg (171 lb 12.8 oz)  05/12/19 76.8 kg (169 lb 6.4 oz)  04/11/19 77.8 kg (171 lb 9.6 oz)      ASSESSMENT AND PLAN:  1. Chronic systolic CHF: Echo [id number] with EF 30-35%, mild to moderately decreased RV systolic function in setting of atrial fibrillation with RVR.  Echo in 9/19 with patient in NSR showed EF up to 50%, mild diffuse hypokinesis.  Back in atrial fibrillation in 2/20, TEE showed EF 35-40%.  Suspect he has atrial fibrillation/tachycardia-mediated cardiomyopathy given fall in EF with recurrent atrial fibrillation.  Echo in 7/20 with EF 20-25%, moderately decreased RV systolic function.  He is now s/p Medtronic CRT-P placement with AV nodal ablation. Echo today was reviewed and showed EF 35-40% (some improvement).  NYHA class II symptoms, feeling better overall.  He is not volume overloaded on exam. - Increase Entresto to 49/51 bid. BMET today and in 10 days.    - Continue digoxin 0.125, check level today.  - Continue Coreg 25 mg bid.     - Continue spironolactone 25 mg daily.  - Low effective CRT percentage on initial interrogation today (69%), adjustment of LV lead output led to increased LV pacing percentage.   With more prevalent CRT, I hope that LV function will continue to improve.  He will need followup with Dr.  Caryl Comes to review his device settings.  2. Atrial fibrillation: Persistent.  Difficult to control rate and rhythm. He failed Tikosyn. He started on amiodarone, then had atrial fibrillation ablation in 5/19 with ERAF, requiring DCCV.  Most recently cardioverted in 2/20 but back in atrial fibrillation by 3/20 and has stayed in atrial fibrillation since.  He saw [name] and they decided against redo ablation. He is now s/p AV nodal ablation.    - He is  off amiodarone given permanent atrial fibrillation.   - Continue Xarelto 15 mg daily.  3. CAD: Extensive coronary calcification on last CT chest. No chest pain. I suspect that his cardiomyopathy is tachycardia-mediated/atrial fibrillation-related given rise in EF in NSR and then fall when back in atrial fibrillation rather than ischemic cardiomyopathy.  - No ASA with Xarelto use.  - Unable to tolerate statins.  He will continue Repatha, good LDL on last lipids check.   4. CKD: Stage 3. BMET today.  5. Aortic valve disease: TEE in 2/20 showed that aortic valve was not bicuspid (though TTE reports have read bicuspid) but did have mild stenosis and moderate AI.  Moderate AI on 7/20 echo.  - Today's echo showed mild-moderate AI, moderate AS. Follow the valve closely over time.  6. Tricuspid regurgitation: Moderate-severe on 7/20 echo, not seen on today's echo.   7. LBBB: Chronic, now s/p CRT.  8. Hyperlipidemia: Unable to tolerate statins.  Now on Repatha.  Good lipids in 5/20.  9. Carotid stenosis: Repeat carotid dopplers in 7/21.    Recommended follow-up:  3 months.   Signed, [name], MD  07/19/2019  [place] [address] [facility] [place] [address] [phone] (office) [phone] (fax)

## 2019-07-25 ENCOUNTER — Ambulatory Visit (INDEPENDENT_AMBULATORY_CARE_PROVIDER_SITE_OTHER): Payer: Medicare Other | Admitting: *Deleted

## 2019-07-25 DIAGNOSIS — I420 Dilated cardiomyopathy: Secondary | ICD-10-CM | POA: Diagnosis not present

## 2019-07-25 LAB — CUP PACEART REMOTE DEVICE CHECK
Battery Remaining Longevity: 77 mo
Battery Voltage: 3.02 V
Brady Statistic AP VP Percent: 0 %
Brady Statistic AP VS Percent: 0 %
Brady Statistic AS VP Percent: 95.83 %
Brady Statistic AS VS Percent: 4.17 %
Brady Statistic RA Percent Paced: 0 %
Brady Statistic RV Percent Paced: 95.83 %
Date Time Interrogation Session: 20210228210350
Implantable Lead Implant Date: 20200831
Implantable Lead Implant Date: 20200831
Implantable Lead Location: 753858
Implantable Lead Location: 753860
Implantable Lead Model: 5076
Implantable Pulse Generator Implant Date: 20200831
Lead Channel Impedance Value: 1045 Ohm
Lead Channel Impedance Value: 1064 Ohm
Lead Channel Impedance Value: 1140 Ohm
Lead Channel Impedance Value: 1140 Ohm
Lead Channel Impedance Value: 1159 Ohm
Lead Channel Impedance Value: 3420 Ohm
Lead Channel Impedance Value: 3420 Ohm
Lead Channel Impedance Value: 399 Ohm
Lead Channel Impedance Value: 513 Ohm
Lead Channel Impedance Value: 570 Ohm
Lead Channel Impedance Value: 608 Ohm
Lead Channel Impedance Value: 627 Ohm
Lead Channel Impedance Value: 646 Ohm
Lead Channel Impedance Value: 988 Ohm
Lead Channel Pacing Threshold Amplitude: 0.75 V
Lead Channel Pacing Threshold Amplitude: 2.75 V
Lead Channel Pacing Threshold Pulse Width: 0.4 ms
Lead Channel Pacing Threshold Pulse Width: 1.2 ms
Lead Channel Sensing Intrinsic Amplitude: 10.5 mV
Lead Channel Sensing Intrinsic Amplitude: 10.5 mV
Lead Channel Setting Pacing Amplitude: 2.5 V
Lead Channel Setting Pacing Amplitude: 3 V
Lead Channel Setting Pacing Pulse Width: 0.4 ms
Lead Channel Setting Pacing Pulse Width: 1.2 ms
Lead Channel Setting Sensing Sensitivity: 2.8 mV

## 2019-07-26 NOTE — Progress Notes (Signed)
PPM Remote  

## 2019-07-28 ENCOUNTER — Other Ambulatory Visit: Payer: Self-pay

## 2019-07-28 ENCOUNTER — Ambulatory Visit (HOSPITAL_COMMUNITY)
Admission: RE | Admit: 2019-07-28 | Discharge: 2019-07-28 | Disposition: A | Discharge status: Home / Self Care | Payer: Medicare Other | Source: Ambulatory Visit | Attending: Cardiology | Admitting: Cardiology

## 2019-07-28 DIAGNOSIS — D225 Melanocytic nevi of trunk: Secondary | ICD-10-CM | POA: Diagnosis not present

## 2019-07-28 DIAGNOSIS — I5022 Chronic systolic (congestive) heart failure: Secondary | ICD-10-CM | POA: Diagnosis not present

## 2019-07-28 DIAGNOSIS — L300 Nummular dermatitis: Secondary | ICD-10-CM | POA: Diagnosis not present

## 2019-07-28 DIAGNOSIS — L570 Actinic keratosis: Secondary | ICD-10-CM | POA: Diagnosis not present

## 2019-07-28 DIAGNOSIS — L821 Other seborrheic keratosis: Secondary | ICD-10-CM | POA: Diagnosis not present

## 2019-07-28 DIAGNOSIS — L812 Freckles: Secondary | ICD-10-CM | POA: Diagnosis not present

## 2019-07-28 LAB — BASIC METABOLIC PANEL
Anion gap: 10 (ref 5–15)
BUN: 27 mg/dL — ABNORMAL HIGH (ref 8–23)
CO2: 25 mmol/L (ref 22–32)
Calcium: 8.9 mg/dL (ref 8.9–10.3)
Chloride: 98 mmol/L (ref 98–111)
Creatinine, Ser: 1.31 mg/dL — ABNORMAL HIGH (ref 0.61–1.24)
GFR calc Af Amer: 60 mL/min — ABNORMAL LOW (ref >60)
GFR calc non Af Amer: 51 mL/min — ABNORMAL LOW (ref >60)
Glucose, Bld: 108 mg/dL — ABNORMAL HIGH (ref 70–99)
Potassium: 4.7 mmol/L (ref 3.5–5.1)
Sodium: 133 mmol/L — ABNORMAL LOW (ref 135–145)

## 2019-08-02 ENCOUNTER — Other Ambulatory Visit (HOSPITAL_COMMUNITY): Payer: Self-pay | Admitting: Cardiology

## 2019-08-02 MED ORDER — CARVEDILOL 25 MG PO TABS: 25.0000 mg | ORAL_TABLET | Freq: Two times a day (BID) | ORAL | 11 refills | Status: DC

## 2019-08-09 ENCOUNTER — Other Ambulatory Visit: Payer: Self-pay | Admitting: Internal Medicine

## 2019-08-10 ENCOUNTER — Ambulatory Visit: Payer: Medicare Other | Attending: Internal Medicine

## 2019-08-10 DIAGNOSIS — Z23 Encounter for immunization: Secondary | ICD-10-CM

## 2019-08-10 NOTE — Progress Notes (Signed)
   Covid-19 Vaccination Clinic  Name:  OAKLAN SERVEN    MRN: [id number] DOB: 07-20-1940  08/10/2019  Mr. Benitez was observed post Covid-19 immunization for 15 minutes without incident. He was provided with Vaccine Information Sheet and instruction to access the V-Safe system.   Mr. Luebbers was instructed to call 911 with any severe reactions post vaccine: Marland Kitchen Difficulty breathing  . Swelling of face and throat  . A fast heartbeat  . A bad rash all over body  . Dizziness and weakness   Immunizations Administered    Name Date Dose VIS Date Route   Pfizer COVID-19 Vaccine 08/10/2019  9:41 AM 0.3 mL 05/06/2019 Intramuscular   Manufacturer: [place]   Lot: [id number]   [place]: [id number]

## 2019-08-23 ENCOUNTER — Other Ambulatory Visit: Payer: Self-pay

## 2019-09-20 ENCOUNTER — Other Ambulatory Visit: Payer: Self-pay | Admitting: Family Medicine

## 2019-10-12 ENCOUNTER — Other Ambulatory Visit: Payer: Self-pay

## 2019-10-12 ENCOUNTER — Ambulatory Visit (HOSPITAL_COMMUNITY)
Admission: RE | Admit: 2019-10-12 | Discharge: 2019-10-12 | Disposition: A | Discharge status: Home / Self Care | Payer: Medicare Other | Source: Ambulatory Visit | Attending: Cardiology | Admitting: Cardiology

## 2019-10-12 VITALS — BP 110/68 | HR 72 | Wt 166.4 lb

## 2019-10-12 DIAGNOSIS — R531 Weakness: Secondary | ICD-10-CM | POA: Diagnosis not present

## 2019-10-12 DIAGNOSIS — Z823 Family history of stroke: Secondary | ICD-10-CM | POA: Diagnosis not present

## 2019-10-12 DIAGNOSIS — R29898 Other symptoms and signs involving the musculoskeletal system: Secondary | ICD-10-CM

## 2019-10-12 DIAGNOSIS — I2510 Atherosclerotic heart disease of native coronary artery without angina pectoris: Secondary | ICD-10-CM | POA: Diagnosis not present

## 2019-10-12 DIAGNOSIS — Z79899 Other long term (current) drug therapy: Secondary | ICD-10-CM | POA: Insufficient documentation

## 2019-10-12 DIAGNOSIS — N183 Chronic kidney disease, stage 3 unspecified: Secondary | ICD-10-CM | POA: Diagnosis not present

## 2019-10-12 DIAGNOSIS — I130 Hypertensive heart and chronic kidney disease with heart failure and stage 1 through stage 4 chronic kidney disease, or unspecified chronic kidney disease: Secondary | ICD-10-CM | POA: Insufficient documentation

## 2019-10-12 DIAGNOSIS — I6523 Occlusion and stenosis of bilateral carotid arteries: Secondary | ICD-10-CM

## 2019-10-12 DIAGNOSIS — Z801 Family history of malignant neoplasm of trachea, bronchus and lung: Secondary | ICD-10-CM | POA: Diagnosis not present

## 2019-10-12 DIAGNOSIS — I4819 Other persistent atrial fibrillation: Secondary | ICD-10-CM | POA: Diagnosis not present

## 2019-10-12 DIAGNOSIS — Z8711 Personal history of peptic ulcer disease: Secondary | ICD-10-CM | POA: Insufficient documentation

## 2019-10-12 DIAGNOSIS — I5022 Chronic systolic (congestive) heart failure: Secondary | ICD-10-CM

## 2019-10-12 DIAGNOSIS — Z87891 Personal history of nicotine dependence: Secondary | ICD-10-CM | POA: Diagnosis not present

## 2019-10-12 DIAGNOSIS — Z7901 Long term (current) use of anticoagulants: Secondary | ICD-10-CM | POA: Insufficient documentation

## 2019-10-12 DIAGNOSIS — I447 Left bundle-branch block, unspecified: Secondary | ICD-10-CM | POA: Diagnosis not present

## 2019-10-12 DIAGNOSIS — E785 Hyperlipidemia, unspecified: Secondary | ICD-10-CM | POA: Diagnosis not present

## 2019-10-12 DIAGNOSIS — Z95810 Presence of automatic (implantable) cardiac defibrillator: Secondary | ICD-10-CM | POA: Diagnosis not present

## 2019-10-12 DIAGNOSIS — I352 Nonrheumatic aortic (valve) stenosis with insufficiency: Secondary | ICD-10-CM | POA: Insufficient documentation

## 2019-10-12 LAB — BASIC METABOLIC PANEL
Anion gap: 12 (ref 5–15)
BUN: 32 mg/dL — ABNORMAL HIGH (ref 8–23)
CO2: 22 mmol/L (ref 22–32)
Calcium: 8.8 mg/dL — ABNORMAL LOW (ref 8.9–10.3)
Chloride: 100 mmol/L (ref 98–111)
Creatinine, Ser: 1.26 mg/dL — ABNORMAL HIGH (ref 0.61–1.24)
GFR calc Af Amer: >60 mL/min (ref >60)
GFR calc non Af Amer: 54 mL/min — ABNORMAL LOW (ref >60)
Glucose, Bld: 114 mg/dL — ABNORMAL HIGH (ref 70–99)
Potassium: 4.7 mmol/L (ref 3.5–5.1)
Sodium: 134 mmol/L — ABNORMAL LOW (ref 135–145)

## 2019-10-12 LAB — CBC
HCT: 39.3 % (ref 39.0–52.0)
Hemoglobin: 13.1 g/dL (ref 13.0–17.0)
MCH: 34.8 pg — ABNORMAL HIGH (ref 26.0–34.0)
MCHC: 33.3 g/dL (ref 30.0–36.0)
MCV: 104.5 fL — ABNORMAL HIGH (ref 80.0–100.0)
Platelets: 206 10*3/uL (ref 150–400)
RBC: 3.76 MIL/uL — ABNORMAL LOW (ref 4.22–5.81)
RDW: 13.6 % (ref 11.5–15.5)
WBC: 6.5 10*3/uL (ref 4.0–10.5)
nRBC: 0 % (ref 0.0–0.2)

## 2019-10-12 LAB — CK: Total CK: 107 U/L (ref 49–397)

## 2019-10-12 LAB — DIGOXIN LEVEL: Digoxin Level: 0.8 ng/mL (ref 0.8–2.0)

## 2019-10-12 MED ORDER — COENZYME Q10 200 MG PO CAPS: 200.0000 mg | ORAL_CAPSULE | Freq: Every day | ORAL | 3 refills | Status: DC

## 2019-10-12 MED ORDER — SPIRONOLACTONE 25 MG PO TABS: 25.0000 mg | ORAL_TABLET | Freq: Every day | ORAL | 3 refills | Status: DC

## 2019-10-12 NOTE — Patient Instructions (Addendum)
Labs done today. We will contact you only if your labs are abnormal.   START CoQ10 200mg (1 tab) by mouth once daily.  INCREASE Spironolactone 25mg (1 tablet) by mouth once daily.  HOLD 2 DOSES OF THE REPATHA INJECTIONS TO SEE IF LEG WEAKNESS IMPROVES  No other medication changes were made. Please continue all other medications as prescribed.  Your physician recommends that you schedule a follow-up appointment in: 2 months   Your physician has requested that you have an ankle brachial index (ABI). During this test an ultrasound and blood pressure cuff are used to evaluate the arteries that supply the arms and legs with blood. Allow thirty minutes for this exam. There are no restrictions or special instructions. Someone will contact you at a later date to schedule an appointment.  Your physician has requested that you have a carotid duplex. This test is an ultrasound of the carotid arteries in your neck. It looks at blood flow through these arteries that supply the brain with blood. Allow one hour for this exam. There are no restrictions or special instructions.Someone will contact you at a later date to schedule an appointment.  At the [facility], you and your health needs are our priority. As part of our continuing mission to provide you with exceptional heart care, we have created designated Provider Care Teams. These Care Teams include your primary Cardiologist (physician) and Advanced Practice Providers (APPs- Physician Assistants and Nurse Practitioners) who all work together to provide you with the care you need, when you need it.   You may see any of the following providers on your designated Care Team at your next follow up: Marland Kitchen Dr Glori Bickers . Dr [name] . Darrick Grinder, NP . Lyda Jester, PA . Audry Riles, PharmD   Please be sure to bring in all your medications bottles to every appointment.

## 2019-10-12 NOTE — Progress Notes (Signed)
Date:  10/12/2019   ID:  Travis [name], DOB May 03, 1941, MRN [id number]   Provider location: [facility] Type of Visit: Established patient   PCP:  Travis Olp, MD  Cardiologist: Dr. Aundra [name] EP: [name]/[name]   History of Present Illness: Travis [name] is a 79 y.o. 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   Pt failed DCCV 08/14/17, 08/31/17, and 09/17/17. Failed Tikosyn. Did have conversion with Amiodarone and DCCV. Pt underwent successful ablation 10/07/17.  However, he had ERAF.  Admitted 5/18 - 10/14/17 with atrial fibrillation/RVR and acute on chronic systolic CHF.  Echo showed EF 30-35%. Diuresed and reloaded with Amio. Underwent successful DCCV 10/13/17.    Echo in 9/19 showed improvement in EF to 50% in NSR.   In 9/19, patient reported profound fatigue.  He was volume overloaded on exam and diuretic was increased.  CBC was done, hgb was low and patient ended up going to the ER for admission.  He was diagnosed with UGIB likely from duodenal ulcer, probably due to meloxicam.  This was stopped.   Again in 10/19, he was profoundly fatigued.  CBC showed hgb 6.7, he was sent to the ER where he had 2 unit transfusion and EGD was done again, showing duodenal stenosis.  I also stopped his atorvastatin.   He went back into atrial fibrillation and had successful TEE-DCCV in 2/20.  TEE showed EF 35-40% with mildly decreased RV systolic function, EF back down in setting of atrial fibrillation.  In 3/20, he was back in atrial fibrillation and has been in atrial fibrillation since then.  He saw [name] and it was decided not to pursue redo ablation.  Repeat echo in 7/20 showed EF 20-25%, moderately decreased RV systolic function, moderate-severe TR,  bicuspid aortic valve with moderate AI, dilated IVC.  Due to permanent atrial fibrillation and rapid rate at times with LBBB, it was decided to pursue AV nodal ablation with BiV pacing. He had Medtronic CRT-P placement in 8/20, then AV nodal ablation in 9/20.   Echo in 2/21 showed EF 35-40% with global hypokinesis, mildly decreased RV systolic function, mild MR, mild-moderate AI with moderate AS.   Patient returns for followup of CHF and atrial fibrillation.  He remains in atrial fibrillation with BiV pacing s/p AV nodal ablation.  No significant dyspnea now.  Main complaint is leg weakness and fatigue, no pain. He has noted this for several months.  He feels like he did when taking statins.  He goes to the gym and works out with a Clinical research associate.  No chest pain. No lightheadedness.  No BRBPR/melena.   ECG (personally reviewed): atrial fibrillation, BiV pacing  Labs (3/19): LDL 65 Labs (8/19): K 5.2 => 4.1, creatinine 1.6 Labs (9/19): K 3.9, creatinine 1.78, hgb 9.6, TSH normal.  Labs (10/19): hgb 6.7 => 11.8, LFTs normal, K 3.6, creatinine 1.3 Labs (11/19): LFTs, TSH normal Labs (1/20): K 4, creatinine 1.15, hgb 14.5 Labs (2/20): Hgb 13.4 Labs (4/20): LFTs normal, TSH elevated Labs (6/20): LDL 38, HDL 64, K 4.2, creatinine 1.45, hgb 14.7 Labs (7/20): K 4.4, creatinine 1.43, TSH and free T3 normal, mildly elevated free T4, LFTs normal Labs (8/20): K 4.9, creatinine 1.26, digoxin 0.9 Labs (9/20): K 4.3, creatinine 1.44 Labs (12/20): digoxin 0.7,  K 5, creatinine 1.36 Labs (3/21): K 4.7, creatinine 1.31  Past Medical History 1. Chronic systolic CHF: Possible tachy-mediated cardiomyopathy.    - Echo(08/13/2017): LVEF 30-35%, moderate AS, mild AI, severe LAE, RV mild dilated with systolic function mild/mod reduced, PA peak pressure 46 mm Hg. - Echo (9/19): EF 50%, mild diffuse hypokinesis, normal RV size and systolic function, PASP 70 mmHg, moderate AS, moderate AI, moderate MR.  - TEE (2/20): EF  35-40%, mild-moderate LV dilation, normal RV size with mildly decreased systolic function, mil MR, trileaflet aortic valve with mild AS (mean gradient 10 mmHg, AVA 1.7 cm^2), moderate AI.  - Echo (7/20): EF 20-25%, moderately decreased RV systolic function, moderate-severe TR, bicuspid aortic valve with moderate AI, dilated IVC.  - Medtronic CRT-P placement 8/20.  - AV nodal ablation 9/20 - Echo (2/21): EF 35-40% with global hypokinesis, mildly decreased RV systolic function, mild MR, mild-moderate AI with moderate AS.  2. Atrial fibrillation: Has been difficult to control.  Failed Tikosyn and multiple DCCVs.  Failed amiodarone prior to ablation.  Atrial fibrillation ablation but then had ERAF.  He was cardioverted again in 5/19 with return to NSR.  DCCV again in 2/20 but back in atrial fibrillation in 3/20.  3. Aortic valve disorder: TEE 2/20 with mild AS, moderate AI (valve is not bicuspid).   - Echo 2/21 with mild-moderate AI, moderate AS.  4. HTN 5. Hyperlipidemia: Severe fatigue with atorvastatin, myalgias with Crestor.  6. Mitral regurgitation: Mild on last echo.  7. CAD: coronary calcium noted on chest CT.  8. CKD: Stage 3.  9. Carotid stenosis: Carotid dopplers (123XX123) with 123456 LICA stenosis.  - Carotid dopplers ([id number]): 123456 LICA stenosis.  10. Chronic LBBB 11. PUD: UGIB in 9/19 with duodenal ulcer probably due to meloxicam use.  - EGD (10/19): Duodenal stenosis.  12. Inguinal hernia  Current Outpatient Medications  Medication Sig Dispense Refill  . acetaminophen (TYLENOL) 500 MG tablet Take 1,000 mg by mouth every 8 (eight) hours as needed for moderate pain or headache.    . carvedilol (COREG) 25 MG tablet TAKE 1 TABLET(25 MG) BY MOUTH TWICE DAILY 180 tablet 2  . digoxin (LANOXIN) 0.125 MG tablet Take 1 tablet (0.125 mg total) by mouth daily. 90 tablet 3  . Evolocumab (REPATHA SURECLICK) [id number] MG/ML SOAJ Inject 140 mg into the skin every 14 (fourteen) days. Every other Wednesday 2  pen 11  . finasteride (PROPECIA) 1 MG tablet Take 1 tablet (1 mg total) by mouth daily. Patient needs to call our office to schedule a appointment for further refills [phone] 30 tablet 0  . Multiple Vitamins-Minerals (PRESERVISION AREDS 2+MULTI VIT PO) Take 1 tablet by mouth in the morning and at bedtime.    . mupirocin ointment (BACTROBAN) 2 % APPLY EXTERNALLY TO FACE EVERY DAY AS NEEDED    . pantoprazole (PROTONIX) 40 MG tablet Take 1 tablet (40 mg total) by mouth 2 (two) times daily. 180 tablet 3  . sacubitril-valsartan (ENTRESTO) 49-51 MG Take 1 tablet by mouth 2 (two) times daily. 60 tablet 5  . spironolactone (ALDACTONE) 25 MG tablet Take 1 tablet (25 mg total) by mouth daily. 90 tablet 3  . temazepam (RESTORIL) 15 MG capsule Take 1 capsule (15 mg total) by mouth at bedtime as needed for sleep. 30 capsule 5  . torsemide (DEMADEX) 20 MG tablet Take 1 tablet (20 mg total) by mouth daily. 90 tablet 3  . traMADol (ULTRAM) 50 MG tablet TAKE 1 TABLET BY MOUTH THREE  TIMES DAILY AS NEEDED FOR MODERATE OR SEVERE ARTHRITIS PAIN IN HANDS AND NECK 90 tablet 3  . XARELTO 15 MG TABS tablet TAKE 1 TABLET(15 MG) BY MOUTH DAILY WITH SUPPER 60 tablet 3  . Coenzyme Q10 200 MG capsule Take 1 capsule (200 mg total) by mouth daily. 90 capsule 3   No current facility-administered medications for this encounter.    Allergies:   Patient has no known allergies.   Social History:  The patient  reports that he quit smoking about 33 years ago. He has a 37.50 pack-year smoking history. He has never used smokeless tobacco. He reports previous alcohol use of about 1.0 standard drinks of alcohol per week. He reports that he does not use drugs.   Family History:  The patient's family history includes Lung cancer (age of onset: 58) in his mother; Stroke in his brother and mother; Stroke (age of onset: 75) in his father.   ROS:  Please see the history of present illness.   All other systems are personally reviewed and  negative.   Exam:   BP 110/68   Pulse 72   Wt 75.5 kg (166 lb 6 oz)   SpO2 98%   BMI 25.30 kg/m  General: NAD Neck: No JVD, no thyromegaly or thyroid nodule.  Lungs: Clear to auscultation bilaterally with normal respiratory effort. CV: Nondisplaced PMI.  Heart regular S1/S2, no S3/S4, 1/6 SEM RUSB.  No peripheral edema.  No carotid bruit.  Difficult to palpate pedal pulses.  Abdomen: Soft, nontender, no hepatosplenomegaly, no distention.  Skin: Intact without lesions or rashes.  Neurologic: Alert and oriented x 3.  Psych: Normal affect. Extremities: No clubbing or cyanosis.  HEENT: Normal.   Recent Labs: 12/06/2018: ALT 15 12/21/2018: TSH 4.279 10/12/2019: BUN 32; Creatinine, Ser 1.26; Hemoglobin 13.1; Platelets 206; Potassium 4.7; Sodium 134  Personally reviewed   Wt Readings from Last 3 Encounters:  10/12/19 75.5 kg (166 lb 6 oz)  07/18/19 77.9 kg (171 lb 12.8 oz)  05/12/19 76.8 kg (169 lb 6.4 oz)      ASSESSMENT AND PLAN:  1. Chronic systolic CHF: Echo [id number] with EF 30-35%, mild to moderately decreased RV systolic function in setting of atrial fibrillation with RVR.  Echo in 9/19 with patient in NSR showed EF up to 50%, mild diffuse hypokinesis.  Back in atrial fibrillation in 2/20, TEE showed EF 35-40%.  Suspect he has atrial fibrillation/tachycardia-mediated cardiomyopathy given fall in EF with recurrent atrial fibrillation.  Echo in 7/20 with EF 20-25%, moderately decreased RV systolic function.  He is now s/p Medtronic CRT-P placement with AV nodal ablation. Echo in 2/21 showed EF 35-40% (some improvement).  NYHA class II symptoms.  He is not volume overloaded on exam. - Continue Entresto 49/51 bid. BMET today and in 10 days.    - Continue digoxin 0.125, check level today.  - Continue Coreg 25 mg bid.     - Increase spironolactone to 25 mg daily, BMET 10 days.  2. Atrial fibrillation: Persistent.  Difficult to control rate and rhythm. He failed Tikosyn. He started on  amiodarone, then had atrial fibrillation ablation in 5/19 with ERAF, requiring DCCV.  Most recently cardioverted in 2/20 but back in atrial fibrillation by 3/20 and has stayed in atrial fibrillation since.  He saw [name] and they decided against redo ablation. He is now s/p AV nodal ablation.    - He is off amiodarone given permanent atrial fibrillation.   - Continue Xarelto 15 mg  daily. CBC today.  3. CAD: Extensive coronary calcification on last CT chest. No chest pain. I suspect that his cardiomyopathy is tachycardia-mediated/atrial fibrillation-related given rise in EF in NSR and then fall when back in atrial fibrillation rather than ischemic cardiomyopathy.  - No ASA with Xarelto use. - Unable to tolerate statins.  He has been on Repatha.    4. CKD: Stage 3. BMET today.  5. Aortic valve disease: TEE in 2/20 showed that aortic valve was not bicuspid (though TTE reports have read bicuspid) but did have mild stenosis and moderate AI.  Moderate AI on 7/20 echo.  - Echo in 2/21 showed mild-moderate AI, moderate AS. Follow the valve closely over time.  6. Tricuspid regurgitation: Moderate-severe on 7/20 echo, not seen on 2/21 echo.   7. LBBB: Chronic, now s/p CRT.  8. Hyperlipidemia: Unable to tolerate statins.  Now on Repatha.  Good lipids in 5/20.  He now reports leg weakness similar to when he was on statins.  I wonder if Repatha could be the culprit.  - Hold Repatha for 1 month, if symptoms resolve, I will stop Repatha and send him back to lipid clinic.  He will also start coenzyme Q10 200 mg daily.  I will arrange for ABIs given difficulty palpating pedal pulses.  9. Carotid stenosis: Repeat carotid dopplers in 7/21.    Recommended follow-up:  2 months.   Signed, [name], MD  10/12/2019  [facility] [address] [facility] [place] [address] [phone] (office) [phone] (fax)

## 2019-10-13 ENCOUNTER — Other Ambulatory Visit (HOSPITAL_COMMUNITY): Payer: Self-pay | Admitting: *Deleted

## 2019-10-13 MED ORDER — SPIRONOLACTONE 25 MG PO TABS: 25.0000 mg | ORAL_TABLET | Freq: Every day | ORAL | 3 refills | Status: DC

## 2019-10-13 MED ORDER — COENZYME Q10 200 MG PO CAPS: 200.0000 mg | ORAL_CAPSULE | Freq: Every day | ORAL | 3 refills | Status: DC

## 2019-10-14 ENCOUNTER — Other Ambulatory Visit: Payer: Self-pay | Admitting: Family Medicine

## 2019-10-19 ENCOUNTER — Other Ambulatory Visit: Payer: Self-pay | Admitting: Family Medicine

## 2019-10-25 ENCOUNTER — Ambulatory Visit (INDEPENDENT_AMBULATORY_CARE_PROVIDER_SITE_OTHER): Payer: Medicare Other | Admitting: *Deleted

## 2019-10-25 ENCOUNTER — Ambulatory Visit (HOSPITAL_COMMUNITY)
Admission: RE | Admit: 2019-10-25 | Discharge: 2019-10-25 | Disposition: A | Discharge status: Home / Self Care | Payer: Medicare Other | Source: Ambulatory Visit | Attending: Cardiology | Admitting: Cardiology

## 2019-10-25 ENCOUNTER — Other Ambulatory Visit: Payer: Self-pay

## 2019-10-25 DIAGNOSIS — I5022 Chronic systolic (congestive) heart failure: Secondary | ICD-10-CM

## 2019-10-25 DIAGNOSIS — I4819 Other persistent atrial fibrillation: Secondary | ICD-10-CM

## 2019-10-25 LAB — BASIC METABOLIC PANEL
Anion gap: 8 (ref 5–15)
BUN: 29 mg/dL — ABNORMAL HIGH (ref 8–23)
CO2: 24 mmol/L (ref 22–32)
Calcium: 8.8 mg/dL — ABNORMAL LOW (ref 8.9–10.3)
Chloride: 101 mmol/L (ref 98–111)
Creatinine, Ser: 1.37 mg/dL — ABNORMAL HIGH (ref 0.61–1.24)
GFR calc Af Amer: 56 mL/min — ABNORMAL LOW (ref >60)
GFR calc non Af Amer: 49 mL/min — ABNORMAL LOW (ref >60)
Glucose, Bld: 121 mg/dL — ABNORMAL HIGH (ref 70–99)
Potassium: 5.3 mmol/L — ABNORMAL HIGH (ref 3.5–5.1)
Sodium: 133 mmol/L — ABNORMAL LOW (ref 135–145)

## 2019-10-26 ENCOUNTER — Telehealth (HOSPITAL_COMMUNITY): Payer: Self-pay

## 2019-10-26 DIAGNOSIS — I5022 Chronic systolic (congestive) heart failure: Secondary | ICD-10-CM

## 2019-10-26 LAB — CUP PACEART REMOTE DEVICE CHECK
Battery Remaining Longevity: 76 mo
Battery Voltage: 3 V
Brady Statistic AP VP Percent: 0 %
Brady Statistic AP VS Percent: 0 %
Brady Statistic AS VP Percent: 96.91 %
Brady Statistic AS VS Percent: 3.09 %
Brady Statistic RA Percent Paced: 0 %
Brady Statistic RV Percent Paced: 96.91 %
Date Time Interrogation Session: 20210601090454
Implantable Lead Implant Date: 20200831
Implantable Lead Implant Date: 20200831
Implantable Lead Location: 753858
Implantable Lead Location: 753860
Implantable Lead Model: 5076
Implantable Pulse Generator Implant Date: 20200831
Lead Channel Impedance Value: 1083 Ohm
Lead Channel Impedance Value: 1121 Ohm
Lead Channel Impedance Value: 1121 Ohm
Lead Channel Impedance Value: 1159 Ohm
Lead Channel Impedance Value: 1178 Ohm
Lead Channel Impedance Value: 1197 Ohm
Lead Channel Impedance Value: 3420 Ohm
Lead Channel Impedance Value: 3420 Ohm
Lead Channel Impedance Value: 361 Ohm
Lead Channel Impedance Value: 551 Ohm
Lead Channel Impedance Value: 551 Ohm
Lead Channel Impedance Value: 646 Ohm
Lead Channel Impedance Value: 646 Ohm
Lead Channel Impedance Value: 646 Ohm
Lead Channel Pacing Threshold Amplitude: 0.625 V
Lead Channel Pacing Threshold Amplitude: 2.5 V
Lead Channel Pacing Threshold Pulse Width: 0.4 ms
Lead Channel Pacing Threshold Pulse Width: 1.2 ms
Lead Channel Sensing Intrinsic Amplitude: 15.125 mV
Lead Channel Sensing Intrinsic Amplitude: 15.125 mV
Lead Channel Setting Pacing Amplitude: 2.5 V
Lead Channel Setting Pacing Amplitude: 3 V
Lead Channel Setting Pacing Pulse Width: 0.4 ms
Lead Channel Setting Pacing Pulse Width: 1.2 ms
Lead Channel Setting Sensing Sensitivity: 2.8 mV

## 2019-10-26 NOTE — Telephone Encounter (Signed)
Pt aware of results and recommendations to follow low k diet. Lab appt scheduled. Voiced understanding.

## 2019-10-26 NOTE — Progress Notes (Signed)
Remote pacemaker transmission.   

## 2019-10-26 NOTE — Telephone Encounter (Signed)
-----   Message from Larey Dresser, MD sent at 10/25/2019 10:59 PM EDT ----- Low K diet, repeat BMET in 10 days.

## 2019-10-28 DIAGNOSIS — Z961 Presence of intraocular lens: Secondary | ICD-10-CM | POA: Diagnosis not present

## 2019-11-01 ENCOUNTER — Other Ambulatory Visit (HOSPITAL_COMMUNITY): Payer: Self-pay | Admitting: Cardiology

## 2019-11-01 DIAGNOSIS — R29898 Other symptoms and signs involving the musculoskeletal system: Secondary | ICD-10-CM

## 2019-11-01 DIAGNOSIS — R0989 Other specified symptoms and signs involving the circulatory and respiratory systems: Secondary | ICD-10-CM

## 2019-11-07 ENCOUNTER — Ambulatory Visit (HOSPITAL_COMMUNITY)
Admission: RE | Admit: 2019-11-07 | Discharge: 2019-11-07 | Disposition: A | Discharge status: Home / Self Care | Payer: Medicare Other | Source: Ambulatory Visit | Attending: Internal Medicine | Admitting: Internal Medicine

## 2019-11-07 ENCOUNTER — Other Ambulatory Visit: Payer: Self-pay

## 2019-11-07 DIAGNOSIS — I5022 Chronic systolic (congestive) heart failure: Secondary | ICD-10-CM | POA: Diagnosis not present

## 2019-11-07 LAB — BASIC METABOLIC PANEL
Anion gap: 8 (ref 5–15)
BUN: 38 mg/dL — ABNORMAL HIGH (ref 8–23)
CO2: 24 mmol/L (ref 22–32)
Calcium: 8.7 mg/dL — ABNORMAL LOW (ref 8.9–10.3)
Chloride: 102 mmol/L (ref 98–111)
Creatinine, Ser: 1.49 mg/dL — ABNORMAL HIGH (ref 0.61–1.24)
GFR calc Af Amer: 51 mL/min — ABNORMAL LOW (ref >60)
GFR calc non Af Amer: 44 mL/min — ABNORMAL LOW (ref >60)
Glucose, Bld: 128 mg/dL — ABNORMAL HIGH (ref 70–99)
Potassium: 4.7 mmol/L (ref 3.5–5.1)
Sodium: 134 mmol/L — ABNORMAL LOW (ref 135–145)

## 2019-11-10 ENCOUNTER — Ambulatory Visit (HOSPITAL_BASED_OUTPATIENT_CLINIC_OR_DEPARTMENT_OTHER)
Admission: RE | Admit: 2019-11-10 | Discharge: 2019-11-10 | Disposition: A | Discharge status: Home / Self Care | Payer: Medicare Other | Source: Ambulatory Visit | Attending: Cardiology | Admitting: Cardiology

## 2019-11-10 ENCOUNTER — Ambulatory Visit (HOSPITAL_COMMUNITY)
Admission: RE | Admit: 2019-11-10 | Discharge: 2019-11-10 | Disposition: A | Discharge status: Home / Self Care | Payer: Medicare Other | Source: Ambulatory Visit | Attending: Cardiology | Admitting: Cardiology

## 2019-11-10 ENCOUNTER — Other Ambulatory Visit (HOSPITAL_COMMUNITY): Payer: Self-pay | Admitting: Cardiology

## 2019-11-10 ENCOUNTER — Other Ambulatory Visit: Payer: Self-pay

## 2019-11-10 DIAGNOSIS — R0989 Other specified symptoms and signs involving the circulatory and respiratory systems: Secondary | ICD-10-CM | POA: Insufficient documentation

## 2019-11-10 DIAGNOSIS — I6523 Occlusion and stenosis of bilateral carotid arteries: Secondary | ICD-10-CM | POA: Diagnosis not present

## 2019-11-10 DIAGNOSIS — R29898 Other symptoms and signs involving the musculoskeletal system: Secondary | ICD-10-CM

## 2019-11-12 ENCOUNTER — Other Ambulatory Visit: Payer: Self-pay | Admitting: Family Medicine

## 2019-11-15 ENCOUNTER — Other Ambulatory Visit (HOSPITAL_COMMUNITY): Payer: Self-pay | Admitting: Cardiology

## 2019-11-17 ENCOUNTER — Telehealth: Payer: Self-pay

## 2019-11-17 NOTE — Telephone Encounter (Signed)
Patient reports he no longer takes Repatha because of leg pain.  Has upcoming appointment with lipid clinic

## 2019-11-17 NOTE — Telephone Encounter (Signed)
LMOMED THE PT TO CALL us BACK W/UPDATED INSURANCE INFO

## 2019-11-22 ENCOUNTER — Other Ambulatory Visit (HOSPITAL_COMMUNITY): Payer: Self-pay | Admitting: *Deleted

## 2019-11-22 MED ORDER — TORSEMIDE 20 MG PO TABS: 20.0000 mg | ORAL_TABLET | Freq: Every day | ORAL | 3 refills | Status: DC

## 2019-11-23 ENCOUNTER — Other Ambulatory Visit: Payer: Self-pay | Admitting: Internal Medicine

## 2019-11-29 ENCOUNTER — Other Ambulatory Visit: Payer: Self-pay | Admitting: Family Medicine

## 2019-12-06 ENCOUNTER — Ambulatory Visit (INDEPENDENT_AMBULATORY_CARE_PROVIDER_SITE_OTHER): Payer: Medicare Other | Admitting: Pharmacist

## 2019-12-06 ENCOUNTER — Other Ambulatory Visit: Payer: Self-pay

## 2019-12-06 DIAGNOSIS — E785 Hyperlipidemia, unspecified: Secondary | ICD-10-CM

## 2019-12-06 DIAGNOSIS — T466X5A Adverse effect of antihyperlipidemic and antiarteriosclerotic drugs, initial encounter: Secondary | ICD-10-CM | POA: Insufficient documentation

## 2019-12-06 DIAGNOSIS — G720 Drug-induced myopathy: Secondary | ICD-10-CM | POA: Diagnosis not present

## 2019-12-06 DIAGNOSIS — I6523 Occlusion and stenosis of bilateral carotid arteries: Secondary | ICD-10-CM

## 2019-12-06 LAB — LDL CHOLESTEROL, DIRECT: LDL Direct: 84 mg/dL (ref 0–99)

## 2019-12-06 LAB — LIPID PANEL
Chol/HDL Ratio: 2.7 ratio (ref 0.0–5.0)
Cholesterol, Total: 154 mg/dL (ref 100–199)
HDL: 58 mg/dL (ref >39)
LDL Chol Calc (NIH): 77 mg/dL (ref 0–99)
Triglycerides: 103 mg/dL (ref 0–149)
VLDL Cholesterol Cal: 19 mg/dL (ref 5–40)

## 2019-12-06 MED ORDER — NEXLIZET 180-10 MG PO TABS: 1.0000 | ORAL_TABLET | Freq: Every day | ORAL | 11 refills | Status: DC

## 2019-12-06 NOTE — Patient Instructions (Addendum)
It was nice to see you today  Your LDL goal is < 70. We will recheck your labs today  I will submit paperwork to your insurance for Nexlizet tablets. They are taken once a day (any time of day, just stay consistent, with or without food is fine) and will lower your LDL by 40%  Call Jinny Blossom, Pharmacist with any concerns [phone]

## 2019-12-06 NOTE — Progress Notes (Addendum)
Patient ID: Travis [name]                 DOB: 05/22/1941                    MRN: [id number]     HPI: Travis [name] is a 79 y.o. male patient referred to lipid clinic by Dr Aundra Dubin. PMH is significant for CAD with coronary calcium noted on chest CT (calcium score of 686 in 09/2017), carotid dopplers 6/21 with 40-59% left ICA stenosis, ERAF after afib ablation, multiple failed DCCV and failed Tikosyn, bicuspid AV with moderate AS and mild AI, HTN, HLD, CKD, and chronic systolic CHF (EF 32-95% on 2/21 echo) with presumed tachy-mediated CMP. He saw Dr Aundra Dubin in May 2021 and reported leg weakness on Repatha that felt similar to previous statin use. He had been on Repatha for 1.5 years. CK was normal at the time. He was advised to start CoQ10 200mg  daily and hold Repatha for 1 month then reassess symptoms.  Pt presents today in good spirits. He stopped his Repatha 2.5 months ago, reports ~50% improvement in leg weakness. Pain was not as bad as when he took statins. Has been taking CoQ10, unsure if it has helped. Can now walk around the block again. He is already in the donut hole (takes Xarelto and Delene Loll, was on Repatha), thinks he will be out of the donut hole in August.  Current Medications: none Intolerances: rosuvastatin 5mg  daily (myalgias), atorvastatin 10mg  and 40mg  daily (severe fatigue), Repatha (leg pain) Risk Factors: calcium score of 686 (64th percentile for age and sex), 40-59% left ICA stenosis, family history of ASCVD LDL goal: 70mg /dL  Diet: Limits alcohol intake. Avoids breads and sweets.  Exercise: Goes to the gym and works out with a trainer twice a week for 45 minutes  Family History: Father with stroke, mother with stroke and lung cancer, brother with stroke  Social History: The patient  reports that he quit smoking about 33 years ago. He has a 37.50 pack-year smoking history. He has never used smokeless tobacco. He reports previous alcohol use of about 1.0 standard drinks of  alcohol per week. He reports that he does not use drugs.   Labs: 11/08/18: TC 118, TG 81, HDL 64, LDL 38 (on Repatha 140mg  Q2W)  Past Medical History:  Diagnosis Date  . Arthritis   . Biatrial enlargement    severe  . Bicuspid aortic valve   . CHF (congestive heart failure) ([place])    JULY 2014  . Chronic renal insufficiency    not aware  . GERD (gastroesophageal reflux disease)   . History of cardioversion 12/16/2012  . History of stomach ulcers   . Hypertension   . Left bundle branch block   . Nonischemic cardiomyopathy ([place])       . Persistent atrial fibrillation ([facility])    multiple prior cardioversion  . Sinus bradycardia   . Status post clamping of cerebral aneurysm 79    Current Outpatient Medications on File Prior to Visit  Medication Sig Dispense Refill  . traMADol (ULTRAM) 50 MG tablet TAKE 1 TAB BY MOUTH 3 TIMES DAILY AS NEEDED FOR MODERATE OR SEVERE ARTHRITIS PAIN IN HANDS AND NECK 90 tablet 3  . acetaminophen (TYLENOL) 500 MG tablet Take 1,000 mg by mouth every 8 (eight) hours as needed for moderate pain or headache.    . carvedilol (COREG) 25 MG tablet TAKE 1 TABLET(25 MG) BY MOUTH TWICE DAILY 180 tablet  2  . Coenzyme Q10 200 MG capsule Take 1 capsule (200 mg total) by mouth daily. 90 capsule 3  . digoxin (LANOXIN) 0.125 MG tablet Take 1 tablet (0.125 mg total) by mouth daily. 90 tablet 3  . Evolocumab (REPATHA SURECLICK) 212 MG/ML SOAJ Inject 140 mg into the skin every 14 (fourteen) days. Every other Wednesday 2 pen 11  . finasteride (PROPECIA) 1 MG tablet TAKE 1 TABLET (1 MG TOTAL) BY MOUTH DAILY. PATIENT NEEDS TO CALL OUR OFFICE TO SCHEDULE A APPOINTMENT FOR FURTHER REFILLS [phone] 30 tablet 0  . Multiple Vitamins-Minerals (PRESERVISION AREDS 2+MULTI VIT PO) Take 1 tablet by mouth in the morning and at bedtime.    . mupirocin ointment (BACTROBAN) 2 % APPLY EXTERNALLY TO FACE EVERY DAY AS NEEDED    . pantoprazole (PROTONIX) 40 MG tablet Take 1 tablet (40 mg total)  by mouth 2 (two) times daily. 180 tablet 3  . sacubitril-valsartan (ENTRESTO) 49-51 MG Take 1 tablet by mouth 2 (two) times daily. 60 tablet 5  . spironolactone (ALDACTONE) 25 MG tablet Take 1 tablet (25 mg total) by mouth daily. 90 tablet 3  . temazepam (RESTORIL) 15 MG capsule Take 1 capsule (15 mg total) by mouth at bedtime as needed for sleep. 30 capsule 5  . torsemide (DEMADEX) 20 MG tablet Take 1 tablet (20 mg total) by mouth daily. 90 tablet 3  . XARELTO 15 MG TABS tablet TAKE 1 TABLET BY MOUTH DAILY WITH SUPPER 60 tablet 3   No current facility-administered medications on file prior to visit.    No Known Allergies  Assessment/Plan:  1. Hyperlipidemia - LDL goal < 70 due to elevated calcium score and ICA stenosis. LDL previously at goal on Repatha, however pt is intolerant to this, atorvastatin, and rosuvastatin. Discussed trial of Praluent, pravastatin, or Nexlizet. Pt prefers to try Nexlizet. Prior authorization submitted and approved through 12/05/20, copay affordable at $19/month. Changed branded meds to 3 month supply since pt is now through the donut hole. Will recheck baseline lipid panel today. Also scheduled follow up labs in 3 months to assess efficacy. Advised pt he can try stopping his CoQ10 to see if he noticed any benefit - can resume if so or d/c if not.  Tj Kitchings E. Allisha Harter, PharmD, BCACP, [place] [address] [place], [place] [address] Phone: [phone]; Fax: [phone] 12/06/2019 10:57 AM

## 2019-12-06 NOTE — Addendum Note (Signed)
Addended by: Zadiel Leyh E on: 12/06/2019 04:22 PM   Modules accepted: Orders

## 2019-12-07 MED ORDER — RIVAROXABAN 15 MG PO TABS: ORAL_TABLET | ORAL | 3 refills | Status: AC

## 2019-12-07 MED ORDER — NEXLIZET 180-10 MG PO TABS: 1.0000 | ORAL_TABLET | Freq: Every day | ORAL | 3 refills | Status: DC

## 2019-12-07 MED ORDER — SACUBITRIL-VALSARTAN 49-51 MG PO TABS: 1.0000 | ORAL_TABLET | Freq: Two times a day (BID) | ORAL | 3 refills | Status: DC

## 2019-12-07 NOTE — Addendum Note (Signed)
Addended by: Markela Wee E on: 12/07/2019 09:40 AM   Modules accepted: Orders

## 2019-12-13 ENCOUNTER — Ambulatory Visit (HOSPITAL_COMMUNITY)
Admission: RE | Admit: 2019-12-13 | Discharge: 2019-12-13 | Disposition: A | Discharge status: Home / Self Care | Payer: Medicare Other | Source: Ambulatory Visit | Attending: Cardiology | Admitting: Cardiology

## 2019-12-13 ENCOUNTER — Encounter: Payer: Self-pay | Admitting: Internal Medicine

## 2019-12-13 ENCOUNTER — Encounter (HOSPITAL_COMMUNITY): Payer: Self-pay | Admitting: Cardiology

## 2019-12-13 ENCOUNTER — Other Ambulatory Visit: Payer: Self-pay

## 2019-12-13 VITALS — BP 108/64 | HR 66 | Wt 171.0 lb

## 2019-12-13 DIAGNOSIS — Z7901 Long term (current) use of anticoagulants: Secondary | ICD-10-CM | POA: Insufficient documentation

## 2019-12-13 DIAGNOSIS — I130 Hypertensive heart and chronic kidney disease with heart failure and stage 1 through stage 4 chronic kidney disease, or unspecified chronic kidney disease: Secondary | ICD-10-CM | POA: Insufficient documentation

## 2019-12-13 DIAGNOSIS — Z8711 Personal history of peptic ulcer disease: Secondary | ICD-10-CM | POA: Insufficient documentation

## 2019-12-13 DIAGNOSIS — I447 Left bundle-branch block, unspecified: Secondary | ICD-10-CM | POA: Insufficient documentation

## 2019-12-13 DIAGNOSIS — I4819 Other persistent atrial fibrillation: Secondary | ICD-10-CM

## 2019-12-13 DIAGNOSIS — N183 Chronic kidney disease, stage 3 unspecified: Secondary | ICD-10-CM | POA: Insufficient documentation

## 2019-12-13 DIAGNOSIS — Z79899 Other long term (current) drug therapy: Secondary | ICD-10-CM | POA: Insufficient documentation

## 2019-12-13 DIAGNOSIS — I2510 Atherosclerotic heart disease of native coronary artery without angina pectoris: Secondary | ICD-10-CM | POA: Insufficient documentation

## 2019-12-13 DIAGNOSIS — I4821 Permanent atrial fibrillation: Secondary | ICD-10-CM | POA: Insufficient documentation

## 2019-12-13 DIAGNOSIS — I352 Nonrheumatic aortic (valve) stenosis with insufficiency: Secondary | ICD-10-CM | POA: Diagnosis not present

## 2019-12-13 DIAGNOSIS — Z87891 Personal history of nicotine dependence: Secondary | ICD-10-CM | POA: Insufficient documentation

## 2019-12-13 DIAGNOSIS — Z950 Presence of cardiac pacemaker: Secondary | ICD-10-CM | POA: Insufficient documentation

## 2019-12-13 DIAGNOSIS — I5022 Chronic systolic (congestive) heart failure: Secondary | ICD-10-CM | POA: Diagnosis not present

## 2019-12-13 DIAGNOSIS — E785 Hyperlipidemia, unspecified: Secondary | ICD-10-CM | POA: Insufficient documentation

## 2019-12-13 LAB — CBC
HCT: 39.7 % (ref 39.0–52.0)
Hemoglobin: 12.8 g/dL — ABNORMAL LOW (ref 13.0–17.0)
MCH: 32.5 pg (ref 26.0–34.0)
MCHC: 32.2 g/dL (ref 30.0–36.0)
MCV: 100.8 fL — ABNORMAL HIGH (ref 80.0–100.0)
Platelets: 171 10*3/uL (ref 150–400)
RBC: 3.94 MIL/uL — ABNORMAL LOW (ref 4.22–5.81)
RDW: 13.9 % (ref 11.5–15.5)
WBC: 5.7 10*3/uL (ref 4.0–10.5)
nRBC: 0 % (ref 0.0–0.2)

## 2019-12-13 LAB — BASIC METABOLIC PANEL
Anion gap: 8 (ref 5–15)
BUN: 42 mg/dL — ABNORMAL HIGH (ref 8–23)
CO2: 23 mmol/L (ref 22–32)
Calcium: 8.6 mg/dL — ABNORMAL LOW (ref 8.9–10.3)
Chloride: 102 mmol/L (ref 98–111)
Creatinine, Ser: 1.55 mg/dL — ABNORMAL HIGH (ref 0.61–1.24)
GFR calc Af Amer: 49 mL/min — ABNORMAL LOW (ref >60)
GFR calc non Af Amer: 42 mL/min — ABNORMAL LOW (ref >60)
Glucose, Bld: 102 mg/dL — ABNORMAL HIGH (ref 70–99)
Potassium: 5.3 mmol/L — ABNORMAL HIGH (ref 3.5–5.1)
Sodium: 133 mmol/L — ABNORMAL LOW (ref 135–145)

## 2019-12-13 LAB — DIGOXIN LEVEL: Digoxin Level: 0.8 ng/mL (ref 0.8–2.0)

## 2019-12-13 MED ORDER — ENTRESTO 97-103 MG PO TABS
1.0000 | ORAL_TABLET | Freq: Two times a day (BID) | ORAL | 6 refills | Status: DC
Start: 2019-12-13 — End: 2020-07-03

## 2019-12-13 NOTE — Progress Notes (Signed)
Date:  12/13/2019   ID:  [name], DOB 1940/06/10, MRN [id number]   Provider location: [facility] Type of Visit: Established patient   PCP:  [name], MD  Cardiologist: [name] EP: [name]/[name]   History of Present Illness: Travis [name] is a 79 y.o. 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   Pt failed DCCV 08/14/17, 08/31/17, and 09/17/17. Failed Tikosyn. Did have conversion with Amiodarone and DCCV. Pt underwent successful ablation 10/07/17.  However, he had ERAF.  Admitted 5/18 - 10/14/17 with atrial fibrillation/RVR and acute on chronic systolic CHF.  Echo showed EF 30-35%. Diuresed and reloaded with Amio. Underwent successful DCCV 10/13/17.    Echo in 9/19 showed improvement in EF to 50% in NSR.   In 9/19, patient reported profound fatigue.  He was volume overloaded on exam and diuretic was increased.  CBC was done, hgb was low and patient ended up going to the ER for admission.  He was diagnosed with UGIB likely from duodenal ulcer, probably due to meloxicam.  This was stopped.   Again in 10/19, he was profoundly fatigued.  CBC showed hgb 6.7, he was sent to the ER where he had 2 unit transfusion and EGD was done again, showing duodenal stenosis.  I also stopped his atorvastatin.   He went back into atrial fibrillation and had successful TEE-DCCV in 2/20.  TEE showed EF 35-40% with mildly decreased RV systolic function, EF back down in setting of atrial fibrillation.  In 3/20, he was back in atrial fibrillation and has been in atrial fibrillation since then.  He saw [name] and it was decided not to pursue redo ablation.  Repeat echo in 7/20 showed EF 20-25%, moderately decreased RV systolic function, moderate-severe TR,  bicuspid aortic valve with moderate AI, dilated IVC.  Due to permanent atrial fibrillation and rapid rate at times with LBBB, it was decided to pursue AV nodal ablation with BiV pacing. He had Medtronic CRT-P placement in 8/20, then AV nodal ablation in 9/20.   Echo in 2/21 showed EF 35-40% with global hypokinesis, mildly decreased RV systolic function, mild MR, mild-moderate AI with moderate AS.   Patient returns for followup of CHF and atrial fibrillation.  He remains in atrial fibrillation with BiV pacing s/p AV nodal ablation.  He is doing well symptomatically.  Leg weakness resolved after he stopped Repatha.  No claudication.  He is now on Nexlizet (bempedoic acid/Zetia).  No significant exertional dyspnea.  No chest pain.  Able to do what he wants now.  No lightheadedness.    Medtronic device interrogation: effective Bi-V pacing only 85%, may not be capturing adequately.  Stable thoracic impedance.   Labs (3/19): LDL 65 Labs (8/19): K 5.2 => 4.1, creatinine 1.6 Labs (9/19): K 3.9, creatinine 1.78, hgb 9.6, TSH normal.  Labs (10/19): hgb 6.7 => 11.8, LFTs normal, K 3.6, creatinine 1.3 Labs (11/19): LFTs, TSH normal Labs (1/20): K 4, creatinine 1.15, hgb 14.5 Labs (2/20): Hgb 13.4 Labs (4/20): LFTs normal, TSH elevated Labs (6/20): LDL 38, HDL 64, K 4.2, creatinine 1.45, hgb 14.7 Labs (7/20): K 4.4, creatinine 1.43, TSH and free T3 normal, mildly elevated free T4, LFTs normal Labs (8/20): K 4.9, creatinine 1.26, digoxin 0.9 Labs (9/20): K 4.3, creatinine  1.44 Labs (12/20): digoxin 0.7, K 5, creatinine 1.36 Labs (3/21): K 4.7, creatinine 1.31 Labs (7/21): K 4.7, creatinine 1.49, LDL 84  Past Medical History 1. Chronic systolic CHF: Possible tachy-mediated cardiomyopathy.    - Echo(08/13/2017): LVEF 30-35%, moderate AS, mild AI, severe LAE, RV mild dilated with systolic function mild/mod reduced, PA peak pressure 46 mm Hg. - Echo (9/19): EF 50%, mild diffuse hypokinesis, normal RV  size and systolic function, PASP 70 mmHg, moderate AS, moderate AI, moderate MR.  - TEE (2/20): EF 35-40%, mild-moderate LV dilation, normal RV size with mildly decreased systolic function, mil MR, trileaflet aortic valve with mild AS (mean gradient 10 mmHg, AVA 1.7 cm^2), moderate AI.  - Echo (7/20): EF 20-25%, moderately decreased RV systolic function, moderate-severe TR, bicuspid aortic valve with moderate AI, dilated IVC.  - Medtronic CRT-P placement 8/20.  - AV nodal ablation 9/20 - Echo (2/21): EF 35-40% with global hypokinesis, mildly decreased RV systolic function, mild MR, mild-moderate AI with moderate AS.  2. Atrial fibrillation: Has been difficult to control.  Failed Tikosyn and multiple DCCVs.  Failed amiodarone prior to ablation.  Atrial fibrillation ablation but then had ERAF.  He was cardioverted again in 5/19 with return to NSR.  DCCV again in 2/20 but back in atrial fibrillation in 3/20.  3. Aortic valve disorder: TEE 2/20 with mild AS, moderate AI (valve is not bicuspid).   - Echo 2/21 with mild-moderate AI, moderate AS.  4. HTN 5. Hyperlipidemia: Severe fatigue with atorvastatin, myalgias with Crestor.  6. Mitral regurgitation: Mild on last echo.  7. CAD: coronary calcium noted on chest CT.  8. CKD: Stage 3.  9. Carotid stenosis: Carotid dopplers (9/24) with 26-83% LICA stenosis.  - Carotid dopplers (4/19): 62-22% LICA stenosis.  - Carotid dopplers (9/79): 89-21% LICA stenosis.  10. Chronic LBBB 11. PUD: UGIB in 9/19 with duodenal ulcer probably due to meloxicam use.  - EGD (10/19): Duodenal stenosis.  12. Inguinal hernia 13. Suspected left subclavian stenosis with unequal pressures in arms.    Current Outpatient Medications  Medication Sig Dispense Refill  . acetaminophen (TYLENOL) 500 MG tablet Take 1,000 mg by mouth every 8 (eight) hours as needed for moderate pain or headache.    . Bempedoic Acid-Ezetimibe (NEXLIZET) 180-10 MG TABS Take 1 tablet by mouth daily. 90  tablet 3  . carvedilol (COREG) 25 MG tablet TAKE 1 TABLET(25 MG) BY MOUTH TWICE DAILY 180 tablet 2  . Coenzyme Q10 200 MG capsule Take 1 capsule (200 mg total) by mouth daily. 90 capsule 3  . digoxin (LANOXIN) 0.125 MG tablet Take 1 tablet (0.125 mg total) by mouth daily. 90 tablet 3  . finasteride (PROPECIA) 1 MG tablet TAKE 1 TABLET (1 MG TOTAL) BY MOUTH DAILY. PATIENT NEEDS TO CALL OUR OFFICE TO SCHEDULE A APPOINTMENT FOR FURTHER REFILLS [phone] 30 tablet 0  . Multiple Vitamins-Minerals (PRESERVISION AREDS 2+MULTI VIT PO) Take 1 tablet by mouth in the morning and at bedtime.    . mupirocin ointment (BACTROBAN) 2 % APPLY EXTERNALLY TO FACE EVERY DAY AS NEEDED    . pantoprazole (PROTONIX) 40 MG tablet Take 1 tablet (40 mg total) by mouth 2 (two) times daily. 180 tablet 3  . Rivaroxaban (XARELTO) 15 MG TABS tablet TAKE 1 TABLET BY MOUTH DAILY WITH SUPPER 90 tablet 3  . spironolactone (ALDACTONE) 25 MG tablet Take 1 tablet (25 mg total) by mouth daily. 90 tablet 3  . temazepam (RESTORIL) 15 MG capsule Take 1 capsule (  15 mg total) by mouth at bedtime as needed for sleep. 30 capsule 5  . torsemide (DEMADEX) 20 MG tablet Take 1 tablet (20 mg total) by mouth daily. 90 tablet 3  . traMADol (ULTRAM) 50 MG tablet TAKE 1 TAB BY MOUTH 3 TIMES DAILY AS NEEDED FOR MODERATE OR SEVERE ARTHRITIS PAIN IN HANDS AND NECK 90 tablet 3  . sacubitril-valsartan (ENTRESTO) 97-103 MG Take 1 tablet by mouth 2 (two) times daily. 60 tablet 6   No current facility-administered medications for this encounter.    Allergies:   Atorvastatin, Repatha [evolocumab], and Rosuvastatin   Social History:  The patient  reports that he quit smoking about 33 years ago. He has a 37.50 pack-year smoking history. He has never used smokeless tobacco. He reports previous alcohol use of about 1.0 standard drink of alcohol per week. He reports that he does not use drugs.   Family History:  The patient's family history includes Lung cancer  (age of onset: 23) in his mother; Stroke in his brother and mother; Stroke (age of onset: 84) in his father.   ROS:  Please see the history of present illness.   All other systems are personally reviewed and negative.   Exam:   BP 108/64   Pulse 66   Wt 77.6 kg (171 lb)   SpO2 98%   BMI 26.00 kg/m  General: NAD Neck: No JVD, no thyromegaly or thyroid nodule.  Lungs: Clear to auscultation bilaterally with normal respiratory effort. CV: Nondisplaced PMI.  Heart regular S1/S2, no S3/S4, 1/6 SEM RUSB.  No peripheral edema.  No carotid bruit.  2+ right radial pulse, trace left radial pulse. Difficult to palpate pedal pulses.   Abdomen: Soft, nontender, no hepatosplenomegaly, no distention.  Skin: Intact without lesions or rashes.  Neurologic: Alert and oriented x 3.  Psych: Normal affect. Extremities: No clubbing or cyanosis.  HEENT: Normal.    Recent Labs: 12/21/2018: TSH 4.279 12/13/2019: BUN 42; Creatinine, Ser 1.55; Hemoglobin 12.8; Platelets 171; Potassium 5.3; Sodium 133  Personally reviewed   Wt Readings from Last 3 Encounters:  12/13/19 77.6 kg (171 lb)  10/12/19 75.5 kg (166 lb 6 oz)  07/18/19 77.9 kg (171 lb 12.8 oz)      ASSESSMENT AND PLAN:  1. Chronic systolic CHF: Echo 0/10 with EF 30-35%, mild to moderately decreased RV systolic function in setting of atrial fibrillation with RVR.  Echo in 9/19 with patient in NSR showed EF up to 50%, mild diffuse hypokinesis.  Back in atrial fibrillation in 2/20, TEE showed EF 35-40%.  Suspect he has atrial fibrillation/tachycardia-mediated cardiomyopathy given fall in EF with recurrent atrial fibrillation.  Echo in 7/20 with EF 20-25%, moderately decreased RV systolic function.  He is now s/p Medtronic CRT-P placement with AV nodal ablation. Echo in 2/21 showed EF 35-40% (some improvement).  NYHA class II symptoms.  He is not volume overloaded on exam or by Optivol. - Increase Entresto to 97/103 bid. BMET today and in 10 days.    -  Continue digoxin 0.125, check level today.  - Continue Coreg 25 mg bid.     - Continue spironolactone 25 mg daily.  - Device interrogation today shows only 85% effective BiV pacing with concern for inadequate capture => will ask device clinic to investigate.  2. Atrial fibrillation: Persistent.  Difficult to control rate and rhythm. He failed Tikosyn. He started on amiodarone, then had atrial fibrillation ablation in 5/19 with ERAF, requiring DCCV.  Most recently cardioverted in  2/20 but back in atrial fibrillation by 3/20 and has stayed in atrial fibrillation since.  He saw [name] and they decided against redo ablation. He is now s/p AV nodal ablation.    - He is off amiodarone given permanent atrial fibrillation.   - Continue Xarelto 15 mg daily. CBC today.  3. CAD: Extensive coronary calcification on last CT chest. No chest pain. I suspect that his cardiomyopathy is tachycardia-mediated/atrial fibrillation-related given rise in EF in NSR and then fall when back in atrial fibrillation rather than ischemic cardiomyopathy.  - No ASA with Xarelto use. - Unable to tolerate statins or Repatha, now on bempedoic acid/Zetia and followed by lipid clinic.    4. CKD: Stage 3. BMET today.  5. Aortic valve disease: TEE in 2/20 showed that aortic valve was not bicuspid (though TTE reports have read bicuspid) but did have mild stenosis and moderate AI.  Moderate AI on 7/20 echo.  - Echo in 2/21 showed mild-moderate AI, moderate AS. Follow the valve closely over time.  6. Tricuspid regurgitation: Moderate-severe on 7/20 echo, not seen on 2/21 echo.   7. LBBB: Chronic, now s/p CRT.  8. Hyperlipidemia: Unable to tolerate statins or Repatha, now on bempedoic acid/Zetia and followed by lipid clinic.   9. Carotid stenosis: Repeat carotid dopplers in 7/21.   10. Subclavian stenosis: Suspect left subclavian stenosis.  Not noted on carotid dopplers, but large pulse differential between the arm and difficult to  palpate left radial pulse.  Seems asymptomatic.  - Medical management, control lipids.  - Take BP in right arm.   Recommended follow-up:  3 months.   Signed, [name], MD  12/13/2019  [facility] [address] [facility] [place] [address] [phone] (office) [phone] (fax)

## 2019-12-13 NOTE — Patient Instructions (Signed)
Increase Entresto to 97/103 mg Twice daily   Labs done today, we will notify you of abnormal results  Labs needed in 1 week  Your physician recommends that you schedule a follow-up appointment in: 3 months  If you have any questions or concerns before your next appointment please send [place] a message through [place] or call our office at [phone].    TO LEAVE A MESSAGE FOR THE NURSE SELECT OPTION 2, PLEASE LEAVE A MESSAGE INCLUDING: . YOUR NAME . DATE OF BIRTH . CALL BACK NUMBER . REASON FOR CALL**this is important as we prioritize the call backs  [place] AS LONG AS YOU CALL BEFORE 4:00 PM  At the [facility], you and your health needs are our priority. As part of our continuing mission to provide you with exceptional heart care, we have created designated Provider Care Teams. These Care Teams include your primary Cardiologist (physician) and Advanced Practice Providers (APPs- Physician Assistants and Nurse Practitioners) who all work together to provide you with the care you need, when you need it.   You may see any of the following providers on your designated Care Team at your next follow up: Marland Kitchen Dr Glori Bickers . Dr [name] . Darrick Grinder, NP . Lyda Jester, PA . Audry Riles, PharmD   Please be sure to bring in all your medications bottles to every appointment.

## 2019-12-14 ENCOUNTER — Telehealth (HOSPITAL_COMMUNITY): Payer: Self-pay

## 2019-12-14 MED ORDER — TORSEMIDE 20 MG PO TABS: 20.0000 mg | ORAL_TABLET | ORAL | 3 refills | Status: DC

## 2019-12-14 NOTE — Telephone Encounter (Signed)
-----   Message from Larey Dresser, MD sent at 12/14/2019  3:30 PM EDT ----- Hold spironolactone for 1 day then restart.  Cut back on dietary K.  Decrease torsemide to 20 mg every other day.  BMET 1 week.

## 2019-12-14 NOTE — Telephone Encounter (Signed)
Patient advised and verbalized understanding.med list updated to reflect changes,pt already has pending lab appt

## 2019-12-18 ENCOUNTER — Other Ambulatory Visit (HOSPITAL_COMMUNITY): Payer: Self-pay | Admitting: Cardiology

## 2019-12-20 ENCOUNTER — Other Ambulatory Visit (HOSPITAL_COMMUNITY): Payer: Self-pay | Admitting: *Deleted

## 2019-12-20 ENCOUNTER — Other Ambulatory Visit: Payer: Self-pay | Admitting: Family Medicine

## 2019-12-20 MED ORDER — DIGOXIN 125 MCG PO TABS: 0.1250 mg | ORAL_TABLET | Freq: Every day | ORAL | 3 refills | Status: DC

## 2019-12-21 MED ORDER — FINASTERIDE 1 MG PO TABS: 1.0000 mg | ORAL_TABLET | Freq: Every day | ORAL | 2 refills | Status: DC

## 2019-12-21 MED ORDER — TEMAZEPAM 15 MG PO CAPS: 15.0000 mg | ORAL_CAPSULE | Freq: Every evening | ORAL | 2 refills | Status: DC | PRN

## 2019-12-21 NOTE — Telephone Encounter (Signed)
Patient is scheduled for 03/15/20

## 2019-12-21 NOTE — Telephone Encounter (Signed)
Please call and schedule pt a visit in order to get refills.

## 2019-12-21 NOTE — Addendum Note (Signed)
Addended by: [name] on: 12/21/2019 02:40 PM   Modules accepted: Orders

## 2019-12-21 NOTE — Telephone Encounter (Signed)
Pt has been scheduled.  °

## 2019-12-21 NOTE — Telephone Encounter (Signed)
LR: 06-21-2019 Qty: 30 w/ 5 Last office visit: 08-16-2018 Upcoming appointment: No pending appt

## 2019-12-21 NOTE — Telephone Encounter (Signed)
Schedule visit then ill refill to get to appointment

## 2019-12-21 NOTE — Telephone Encounter (Signed)
LVM for patient to call back and schedule appt with Dr. Hunter.  

## 2019-12-22 ENCOUNTER — Ambulatory Visit (HOSPITAL_COMMUNITY)
Admission: RE | Admit: 2019-12-22 | Discharge: 2019-12-22 | Disposition: A | Discharge status: Home / Self Care | Payer: Medicare Other | Source: Ambulatory Visit | Attending: Internal Medicine | Admitting: Internal Medicine

## 2019-12-22 ENCOUNTER — Telehealth (HOSPITAL_COMMUNITY): Payer: Self-pay

## 2019-12-22 ENCOUNTER — Other Ambulatory Visit: Payer: Self-pay

## 2019-12-22 DIAGNOSIS — I5022 Chronic systolic (congestive) heart failure: Secondary | ICD-10-CM | POA: Diagnosis not present

## 2019-12-22 LAB — BASIC METABOLIC PANEL
Anion gap: 11 (ref 5–15)
BUN: 42 mg/dL — ABNORMAL HIGH (ref 8–23)
CO2: 22 mmol/L (ref 22–32)
Calcium: 8.9 mg/dL (ref 8.9–10.3)
Chloride: 100 mmol/L (ref 98–111)
Creatinine, Ser: 1.71 mg/dL — ABNORMAL HIGH (ref 0.61–1.24)
GFR calc Af Amer: 43 mL/min — ABNORMAL LOW (ref >60)
GFR calc non Af Amer: 37 mL/min — ABNORMAL LOW (ref >60)
Glucose, Bld: 124 mg/dL — ABNORMAL HIGH (ref 70–99)
Potassium: 5.6 mmol/L — ABNORMAL HIGH (ref 3.5–5.1)
Sodium: 133 mmol/L — ABNORMAL LOW (ref 135–145)

## 2019-12-22 MED ORDER — VELTASSA 8.4 G PO PACK: 8.4000 g | PACK | Freq: Every day | ORAL | 5 refills | Status: DC

## 2019-12-22 NOTE — Telephone Encounter (Signed)
-----   Message from Larey Dresser, MD sent at 12/22/2019 12:11 PM EDT ----- Increase hydration.  Think he will need Veltassa to stay on his cardiac meds.  Would like him to start Veltassa 8.4 g daily asap.  Repeat BMET Monday or Tuesday.  Low K diet.

## 2019-12-22 NOTE — Telephone Encounter (Signed)
Patient advised and verbalized understanding. New rx sent in,lab appt scheduled,lab order entered.

## 2019-12-27 ENCOUNTER — Ambulatory Visit (HOSPITAL_COMMUNITY)
Admission: RE | Admit: 2019-12-27 | Discharge: 2019-12-27 | Disposition: A | Discharge status: Home / Self Care | Payer: Medicare Other | Source: Ambulatory Visit | Attending: Cardiology | Admitting: Cardiology

## 2019-12-27 ENCOUNTER — Other Ambulatory Visit: Payer: Self-pay

## 2019-12-27 DIAGNOSIS — I5022 Chronic systolic (congestive) heart failure: Secondary | ICD-10-CM | POA: Insufficient documentation

## 2019-12-27 LAB — BASIC METABOLIC PANEL
Anion gap: 8 (ref 5–15)
BUN: 31 mg/dL — ABNORMAL HIGH (ref 8–23)
CO2: 22 mmol/L (ref 22–32)
Calcium: 8.8 mg/dL — ABNORMAL LOW (ref 8.9–10.3)
Chloride: 102 mmol/L (ref 98–111)
Creatinine, Ser: 1.44 mg/dL — ABNORMAL HIGH (ref 0.61–1.24)
GFR calc Af Amer: 53 mL/min — ABNORMAL LOW (ref >60)
GFR calc non Af Amer: 46 mL/min — ABNORMAL LOW (ref >60)
Glucose, Bld: 106 mg/dL — ABNORMAL HIGH (ref 70–99)
Potassium: 5.3 mmol/L — ABNORMAL HIGH (ref 3.5–5.1)
Sodium: 132 mmol/L — ABNORMAL LOW (ref 135–145)

## 2019-12-29 ENCOUNTER — Telehealth (HOSPITAL_COMMUNITY): Payer: Self-pay

## 2019-12-29 DIAGNOSIS — I5022 Chronic systolic (congestive) heart failure: Secondary | ICD-10-CM

## 2019-12-29 NOTE — Telephone Encounter (Signed)
-----   Message from Larey Dresser, MD sent at 12/27/2019  5:03 PM EDT ----- Creatinine is better, K is lower.  Is he taking Veltassa 8.4 g daily? Would repeat BMET in a week, continuing current Veltassa, to see if K continues to come down.

## 2019-12-29 NOTE — Telephone Encounter (Signed)
Patient advised and verbalized understanding. Patient confirms that he is taking veltassa QD. Lab appt scheduled,lab order entered.   Orders Placed This Encounter  Procedures  . Basic Metabolic Panel (BMET)    Standing Status:   Future    Standing Expiration Date:   12/28/2020    Order Specific Question:   Release to patient    Answer:   Immediate

## 2020-01-05 ENCOUNTER — Other Ambulatory Visit: Payer: Self-pay

## 2020-01-05 ENCOUNTER — Ambulatory Visit (HOSPITAL_COMMUNITY)
Admission: RE | Admit: 2020-01-05 | Discharge: 2020-01-05 | Disposition: A | Discharge status: Home / Self Care | Payer: Medicare Other | Source: Ambulatory Visit | Attending: Internal Medicine | Admitting: Internal Medicine

## 2020-01-05 DIAGNOSIS — I5022 Chronic systolic (congestive) heart failure: Secondary | ICD-10-CM | POA: Diagnosis not present

## 2020-01-05 LAB — BASIC METABOLIC PANEL
Anion gap: 11 (ref 5–15)
BUN: 40 mg/dL — ABNORMAL HIGH (ref 8–23)
CO2: 21 mmol/L — ABNORMAL LOW (ref 22–32)
Calcium: 9 mg/dL (ref 8.9–10.3)
Chloride: 99 mmol/L (ref 98–111)
Creatinine, Ser: 1.69 mg/dL — ABNORMAL HIGH (ref 0.61–1.24)
Glucose, Bld: 116 mg/dL — ABNORMAL HIGH (ref 70–99)
Potassium: 5 mmol/L (ref 3.5–5.1)
Sodium: 131 mmol/L — ABNORMAL LOW (ref 135–145)

## 2020-01-12 ENCOUNTER — Ambulatory Visit (INDEPENDENT_AMBULATORY_CARE_PROVIDER_SITE_OTHER): Payer: Medicare Other | Admitting: Emergency Medicine

## 2020-01-12 ENCOUNTER — Other Ambulatory Visit: Payer: Self-pay

## 2020-01-12 DIAGNOSIS — I420 Dilated cardiomyopathy: Secondary | ICD-10-CM | POA: Diagnosis not present

## 2020-01-12 DIAGNOSIS — R001 Bradycardia, unspecified: Secondary | ICD-10-CM | POA: Diagnosis not present

## 2020-01-12 LAB — CUP PACEART INCLINIC DEVICE CHECK
Battery Remaining Longevity: 88 mo
Battery Voltage: 2.99 V
Brady Statistic AP VP Percent: 0 %
Brady Statistic AP VS Percent: 0 %
Brady Statistic AS VP Percent: 97.02 %
Brady Statistic AS VS Percent: 2.98 %
Brady Statistic RA Percent Paced: 0 %
Brady Statistic RV Percent Paced: 97.01 %
Date Time Interrogation Session: 20210819171200
Implantable Lead Implant Date: 20200831
Implantable Lead Implant Date: 20200831
Implantable Lead Location: 753858
Implantable Lead Location: 753860
Implantable Lead Model: 5076
Implantable Pulse Generator Implant Date: 20200831
Lead Channel Impedance Value: 1083 Ohm
Lead Channel Impedance Value: 1121 Ohm
Lead Channel Impedance Value: 1121 Ohm
Lead Channel Impedance Value: 1159 Ohm
Lead Channel Impedance Value: 1178 Ohm
Lead Channel Impedance Value: 1178 Ohm
Lead Channel Impedance Value: 3420 Ohm
Lead Channel Impedance Value: 3420 Ohm
Lead Channel Impedance Value: 399 Ohm
Lead Channel Impedance Value: 551 Ohm
Lead Channel Impedance Value: 570 Ohm
Lead Channel Impedance Value: 646 Ohm
Lead Channel Impedance Value: 684 Ohm
Lead Channel Impedance Value: 741 Ohm
Lead Channel Pacing Threshold Amplitude: 0.75 V
Lead Channel Pacing Threshold Amplitude: 1 V
Lead Channel Pacing Threshold Pulse Width: 0.4 ms
Lead Channel Pacing Threshold Pulse Width: 1.2 ms
Lead Channel Sensing Intrinsic Amplitude: 15.125 mV
Lead Channel Setting Pacing Amplitude: 2.5 V
Lead Channel Setting Pacing Amplitude: 2.5 V
Lead Channel Setting Pacing Pulse Width: 0.4 ms
Lead Channel Setting Pacing Pulse Width: 1.2 ms
Lead Channel Setting Sensing Sensitivity: 2.8 mV

## 2020-01-12 NOTE — Progress Notes (Signed)
CRT-P device check in clinic. Normal device function. RV thresholds, sensing, impedance consistent with previous measurements. LV sensing and impedances consistent with previous measurements. LV thresholds tested at the following vectors: LV2- can  was 2.75 V @ 1.2 ms, LV 3- can was 2.0 @ 1.2 ms, LV 4 to can was 2.0 @ 1.2 ms, LV1to can was 1.0 V @ 1.2 ms.  LV vector programmed to  LV1 to can and rhythm strip reviewed. Histograms appropriate for patient and level of activity. No ventricular high rate episodes. Patient bi-ventricularly pacing 97% of the time and effectively BiV pacing 84.4%. Device programmed with appropriate safety margins. Device heart failure diagnostics are within normal limits and stable over time. Estimated longevity 7 years, 4 months. Patient enrolled in remote follow-up and next remote 01/24/20 and plan to check device remotely every 3 months after.

## 2020-01-24 ENCOUNTER — Ambulatory Visit (INDEPENDENT_AMBULATORY_CARE_PROVIDER_SITE_OTHER): Payer: Medicare Other | Admitting: *Deleted

## 2020-01-24 DIAGNOSIS — I420 Dilated cardiomyopathy: Secondary | ICD-10-CM | POA: Diagnosis not present

## 2020-01-24 LAB — CUP PACEART REMOTE DEVICE CHECK
Battery Remaining Longevity: 84 mo
Battery Voltage: 2.99 V
Brady Statistic AP VP Percent: 0 %
Brady Statistic AP VS Percent: 0 %
Brady Statistic AS VP Percent: 97.29 %
Brady Statistic AS VS Percent: 2.71 %
Brady Statistic RA Percent Paced: 0 %
Brady Statistic RV Percent Paced: 97.28 %
Date Time Interrogation Session: 20210831061248
Implantable Lead Implant Date: 20200831
Implantable Lead Implant Date: 20200831
Implantable Lead Location: 753858
Implantable Lead Location: 753860
Implantable Lead Model: 5076
Implantable Pulse Generator Implant Date: 20200831
Lead Channel Impedance Value: 1026 Ohm
Lead Channel Impedance Value: 1045 Ohm
Lead Channel Impedance Value: 1064 Ohm
Lead Channel Impedance Value: 1102 Ohm
Lead Channel Impedance Value: 1121 Ohm
Lead Channel Impedance Value: 1121 Ohm
Lead Channel Impedance Value: 3420 Ohm
Lead Channel Impedance Value: 3420 Ohm
Lead Channel Impedance Value: 380 Ohm
Lead Channel Impedance Value: 551 Ohm
Lead Channel Impedance Value: 551 Ohm
Lead Channel Impedance Value: 608 Ohm
Lead Channel Impedance Value: 608 Ohm
Lead Channel Impedance Value: 627 Ohm
Lead Channel Pacing Threshold Amplitude: 0.625 V
Lead Channel Pacing Threshold Amplitude: 0.875 V
Lead Channel Pacing Threshold Pulse Width: 0.4 ms
Lead Channel Pacing Threshold Pulse Width: 1.2 ms
Lead Channel Sensing Intrinsic Amplitude: 15.125 mV
Lead Channel Sensing Intrinsic Amplitude: 15.125 mV
Lead Channel Setting Pacing Amplitude: 2.5 V
Lead Channel Setting Pacing Amplitude: 2.5 V
Lead Channel Setting Pacing Pulse Width: 0.4 ms
Lead Channel Setting Pacing Pulse Width: 1.2 ms
Lead Channel Setting Sensing Sensitivity: 2.8 mV

## 2020-01-25 NOTE — Progress Notes (Signed)
Remote pacemaker transmission.   

## 2020-01-31 ENCOUNTER — Telehealth (HOSPITAL_COMMUNITY): Payer: Self-pay | Admitting: Cardiology

## 2020-01-31 NOTE — Telephone Encounter (Signed)
I helped the pt send a manual transmission for the nurse to review. I told him once the nurse review it the nurse will give him a call back.

## 2020-01-31 NOTE — Telephone Encounter (Signed)
Patient left message on triage line with concern regarding most recent pacer check Reports he sent in pacer check on 8/19 and send transmission was sent he has experienced irregular pounding. Pounding is increased while lying down   Advised would forward to EP/Device clinic for further interrogation

## 2020-01-31 NOTE — Telephone Encounter (Signed)
Returned patients phone call. Patient reports "pounding in my heart that comes and goes when I am laying down." Patient states its worse when laying down. Denies any pain.  01/31/20 transmission reviewed and no abnormal findings noted.  Patient reports he was seen in device clinic on 01/12/20 and after he has experienced this. Advised patient that I am not sure if this would affect his symptoms but I will follow-up and give him a call back today.  Patient agreeable to plan.

## 2020-01-31 NOTE — Telephone Encounter (Signed)
Reviewed with Raquel Sarna, RN. Agree that she does not think changing vectors could cause this. ?pocet stim. Offered patient apt. 02/02/20 with Renee at 11:00. Location verified with patient.

## 2020-02-01 DIAGNOSIS — M25552 Pain in left hip: Secondary | ICD-10-CM | POA: Diagnosis not present

## 2020-02-02 ENCOUNTER — Ambulatory Visit (INDEPENDENT_AMBULATORY_CARE_PROVIDER_SITE_OTHER): Payer: Medicare Other | Admitting: Physician Assistant

## 2020-02-02 ENCOUNTER — Other Ambulatory Visit: Payer: Self-pay

## 2020-02-02 VITALS — BP 122/78 | HR 81 | Ht 68.0 in | Wt 173.0 lb

## 2020-02-02 DIAGNOSIS — E785 Hyperlipidemia, unspecified: Secondary | ICD-10-CM

## 2020-02-02 DIAGNOSIS — I4821 Permanent atrial fibrillation: Secondary | ICD-10-CM | POA: Diagnosis not present

## 2020-02-02 DIAGNOSIS — I420 Dilated cardiomyopathy: Secondary | ICD-10-CM | POA: Diagnosis not present

## 2020-02-02 DIAGNOSIS — T466X5A Adverse effect of antihyperlipidemic and antiarteriosclerotic drugs, initial encounter: Secondary | ICD-10-CM | POA: Diagnosis not present

## 2020-02-02 DIAGNOSIS — I6523 Occlusion and stenosis of bilateral carotid arteries: Secondary | ICD-10-CM

## 2020-02-02 DIAGNOSIS — G720 Drug-induced myopathy: Secondary | ICD-10-CM | POA: Diagnosis not present

## 2020-02-02 DIAGNOSIS — Z950 Presence of cardiac pacemaker: Secondary | ICD-10-CM | POA: Diagnosis not present

## 2020-02-02 LAB — COMPREHENSIVE METABOLIC PANEL
ALT: 11 IU/L (ref 0–44)
AST: 20 IU/L (ref 0–40)
Albumin/Globulin Ratio: 1.5 (ref 1.2–2.2)
Albumin: 4.4 g/dL (ref 3.7–4.7)
Alkaline Phosphatase: 103 IU/L (ref 48–121)
BUN/Creatinine Ratio: 21 (ref 10–24)
BUN: 31 mg/dL — ABNORMAL HIGH (ref 8–27)
Bilirubin Total: 0.4 mg/dL (ref 0.0–1.2)
CO2: 20 mmol/L (ref 20–29)
Calcium: 9.3 mg/dL (ref 8.6–10.2)
Chloride: 100 mmol/L (ref 96–106)
Creatinine, Ser: 1.49 mg/dL — ABNORMAL HIGH (ref 0.76–1.27)
GFR calc Af Amer: 51 mL/min/{1.73_m2} — ABNORMAL LOW (ref >59)
GFR calc non Af Amer: 44 mL/min/{1.73_m2} — ABNORMAL LOW (ref >59)
Globulin, Total: 2.9 g/dL (ref 1.5–4.5)
Glucose: 114 mg/dL — ABNORMAL HIGH (ref 65–99)
Potassium: 4.9 mmol/L (ref 3.5–5.2)
Sodium: 134 mmol/L (ref 134–144)
Total Protein: 7.3 g/dL (ref 6.0–8.5)

## 2020-02-02 LAB — LIPID PANEL
Chol/HDL Ratio: 3.5 ratio (ref 0.0–5.0)
Cholesterol, Total: 190 mg/dL (ref 100–199)
HDL: 55 mg/dL (ref >39)
LDL Chol Calc (NIH): 118 mg/dL — ABNORMAL HIGH (ref 0–99)
Triglycerides: 94 mg/dL (ref 0–149)
VLDL Cholesterol Cal: 17 mg/dL (ref 5–40)

## 2020-02-02 NOTE — Patient Instructions (Addendum)
Medication Instructions:  Your physician recommends that you continue on your current medications as directed. Please refer to the Current Medication list given to you today.  *If you need a refill on your cardiac medications before your next appointment, please call your pharmacy*   Lab Work: LIPIDS AND CMET TODAY ( [place] 11/2019)   If you have labs (blood work) drawn today and your tests are completely normal, you will receive your results only by: Marland Kitchen MyChart Message (if you have MyChart) OR . A paper copy in the mail If you have any lab test that is abnormal or we need to change your treatment, we will call you to review the results.   Testing/Procedures: NONE ORDERED  TODAY   Follow-Up: At [facility], you and your health needs are our priority.  As part of our continuing mission to provide you with exceptional heart care, we have created designated Provider Care Teams.  These Care Teams include your primary Cardiologist (physician) and Advanced Practice Providers (APPs -  Physician Assistants and Nurse Practitioners) who all work together to provide you with the care you need, when you need it.  We recommend signing up for the patient portal called "MyChart".  Sign up information is provided on this After Visit Summary.  MyChart is used to connect with patients for Virtual Visits (Telemedicine).  Patients are able to view lab/test results, encounter notes, upcoming appointments, etc.  Non-urgent messages can be sent to your provider as well.   To learn more about what you can do with MyChart, go to [email or web address].    Your next appointment:   2 month(s)  The format for your next appointment:   In Person  Provider:   You may see [name]  or one of the following Advanced Practice Providers on your designated Care Team:    Chanetta Marshall, NP  Tommye Standard, PA-C  Legrand Como "Oda Kilts, [place]    Other Instructions

## 2020-02-02 NOTE — Progress Notes (Signed)
Cardiology Office Note Date:  02/02/2020  Patient ID:  Travis [name] 10/22/1940, MRN [id number] PCP:  [name], MD  Cardiologist:  [name] Electrophysiologist: [name]    Chief Complaint:  "thumping" periodically since device reprogramming   History of Present Illness: Travis [name] is a 79 y.o. male with history of recurrent NICM (BiV) in setting of AFib (felt to be 2/2 tachy/afib), HTN, HLD, CKD (III), LBBB, CAD by CT Ca++, is now s/p PPM /AV node ablation, VHD w/mild-mod AI and mod AS  There is mentioned h/o bicuspid AV, though TEE Feb 2020 noted trileaflet   He comes in today to be seen for [name].  He is now s/p AV node ablation on 02/18/19 He last saw [name] Nov 2021, he was feeling better w/CRT pacing.  He programmed his device from Can-2 to can-3 to improve longevity, discussed Check ECG to look for morphology in lead 1 as 1-2  Q-LV was considerably shorter than 3-4 with plans to check an echo in a month.  LVEF 35-40% (up from 20-25%), with global hypokinesis, mildly decreased RV systolic function, mild MR, mild-moderate AI with moderate AS.  suspected subclavian stenosis with unequal BPs, and reduced L radial pulse, asymptomatic.  He saw [name] in July 2021 Noted only 85%BP and referred to device clinic to ensure device function/adequate capture.  Device clinic check 8/19/221 noted LV2- can  was 2.75 V @ 1.2 ms,  LV 3- can was 2.0 @ 1.2 ms,  LV 4 to can was 2.0 @ 1.2 ms,  LV1to can was 1.0 V @ 1.2 ms Patient bi-ventricularly pacing 97% of the time and effectively BiV pacing 84.4%. -- LV vector programmed to  LV1 to can and rhythm strip reviewed  He called reporting an unusual thumping that seemed positional since reprogramming.  TODAY He reports the jumping/thumping is at his lower left rib border.  It random, mostly when on his L side but has felt in in other positions as well.  It is not persistent, but is new since his last programming  change.  It is not painful and does not make him feel bad. He feels very well actually.  states that since his implant he is night and day better with minimal if any intolerances. No CP, palpitation or cardiac awareness. No dizzy spells, near syncope or syncope. He reports his home weight very stable  Afib Hx A number of failed DCCV Failed Tikosyn  AFib Ablation w/EWRAF Amiodarone started may 2019 >> DCCV 01/2018 admitted with symptomatic anemia and UGIB  (ulcer likely 2/2 NSAID) Feb 2020 AFib > TEE/DCCV March back in Afib, evaluated by [name], no plans to pursue repeat PVI ablation >>> Permanent AFib >> 01/24/2019 CRT-P implanted  02/18/2019 AV node ablation    Device information MDT dual chamber PPM (RV/LV leads) implanted 01/24/2019 He has a SJM LV lead (system therfore is not MRI compatible) AV node ablation 02/18/2019   Past Medical History:  Diagnosis Date  . Arthritis   . Biatrial enlargement    severe  . Bicuspid aortic valve   . CHF (congestive heart failure) ([place])    JULY 2014  . Chronic renal insufficiency    not aware  . GERD (gastroesophageal reflux disease)   . History of cardioversion 12/16/2012  . History of stomach ulcers   . Hypertension   . Left bundle branch block   . Nonischemic cardiomyopathy ([place])       .  Persistent atrial fibrillation ([place])    multiple prior cardioversion  . Sinus bradycardia   . Status post clamping of cerebral aneurysm 79    Past Surgical History:  Procedure Laterality Date  . ATRIAL FIBRILLATION ABLATION N/A 10/06/2017   Procedure: ATRIAL FIBRILLATION ABLATION;  Surgeon: [name], MD;  Location: [place] [facility];  Service: Cardiovascular;  Laterality: N/A;  . AV NODE ABLATION N/A 02/18/2019   Procedure: AV NODE ABLATION;  Surgeon: Deboraha Sprang, MD;  Location: [facility];  Service: Cardiovascular;  Laterality: N/A;  . BIOPSY  02/06/2018   Procedure: BIOPSY;  Surgeon: Lavena Bullion, DO;  Location: [facility]  ENDOSCOPY;  Service: Gastroenterology;;  . BIOPSY  02/28/2018   Procedure: BIOPSY;  Surgeon: Mauri Pole, MD;  Location: [facility];  Service: Endoscopy;;  . BIV PACEMAKER INSERTION CRT-P N/A 01/24/2019   Procedure: BIV PACEMAKER INSERTION CRT-P;  Surgeon: Deboraha Sprang, MD;  Location: [facility];  Service: Cardiovascular;  Laterality: N/A;  . CARDIAC CATHETERIZATION     not awaer of this  . CARDIOVERSION N/A 12/16/2012   Procedure: CARDIOVERSION;  Surgeon: Lelon Perla, MD;  Location: [facility] ENDOSCOPY;  Service: Cardiovascular;  Laterality: N/A;  . CARDIOVERSION N/A 08/14/2017   Procedure: CARDIOVERSION;  Surgeon: Josue Hector, MD;  Location: [facility] ENDOSCOPY;  Service: Cardiovascular;  Laterality: N/A;  . CARDIOVERSION N/A 09/21/2017   Procedure: CARDIOVERSION;  Surgeon: Sanda Klein, MD;  Location: [place] [facility] ENDOSCOPY;  Service: Cardiovascular;  Laterality: N/A;  . CARDIOVERSION N/A 10/13/2017   Procedure: CARDIOVERSION;  Surgeon: Josue Hector, MD;  Location: [facility] ENDOSCOPY;  Service: Cardiovascular;  Laterality: N/A;  . CARDIOVERSION N/A 07/05/2018   Procedure: CARDIOVERSION;  Surgeon: Larey Dresser, MD;  Location: [facility] ENDOSCOPY;  Service: Cardiovascular;  Laterality: N/A;  . CATARACT EXTRACTION  05/2015   bilateral  . cerebral anuersym post clips    . ESOPHAGOGASTRODUODENOSCOPY N/A 02/06/2018   Procedure: ESOPHAGOGASTRODUODENOSCOPY (EGD);  Surgeon: Lavena Bullion, DO;  Location: [facility] ENDOSCOPY;  Service: Gastroenterology;  Laterality: N/A;  . ESOPHAGOGASTRODUODENOSCOPY N/A 02/28/2018   Procedure: ESOPHAGOGASTRODUODENOSCOPY (EGD);  Surgeon: Mauri Pole, MD;  Location: [facility] ENDOSCOPY;  Service: Endoscopy;  Laterality: N/A;  . fractured left arm    . HERNIA REPAIR     lft  . INGUINAL HERNIA REPAIR  07/02/2011   Procedure: LAPAROSCOPIC INGUINAL HERNIA;  Surgeon: Harl Bowie, MD;  Location: [place];  Service: General;  Laterality: Left;  Laparoscopic left inguinal  hernia repair and mesh  . INGUINAL HERNIA REPAIR Left 11/24/2018   Procedure: OPEN LEFT INGUINAL HERNIA REPAIR WITH MESH;  Surgeon: Coralie Keens, MD;  Location: [place];  Service: General;  Laterality: Left;  TAP BLOCK  . left tendon repair     lft foot  . ROTATOR CUFF REPAIR     lf  . TEE WITHOUT CARDIOVERSION N/A 08/14/2017   Procedure: TRANSESOPHAGEAL ECHOCARDIOGRAM (TEE);  Surgeon: Josue Hector, MD;  Location: [facility] ENDOSCOPY;  Service: Cardiovascular;  Laterality: N/A;  . TEE WITHOUT CARDIOVERSION N/A 07/05/2018   Procedure: TRANSESOPHAGEAL ECHOCARDIOGRAM (TEE);  Surgeon: Larey Dresser, MD;  Location: [facility] ENDOSCOPY;  Service: Cardiovascular;  Laterality: N/A;  . TONSILLECTOMY    . TOTAL KNEE ARTHROPLASTY Bilateral 02/06/2014   Procedure: TOTAL KNEE BILATERAL;  Surgeon: Mauri Pole, MD;  Location: [facility];  Service: Orthopedics;  Laterality: Bilateral;  . WRIST GANGLION EXCISION     lft    Current Outpatient Medications  Medication Sig Dispense  Refill  . acetaminophen (TYLENOL) 500 MG tablet Take 1,000 mg by mouth every 8 (eight) hours as needed for moderate pain or headache.    . Bempedoic Acid-Ezetimibe (NEXLIZET) 180-10 MG TABS Take 1 tablet by mouth daily. 90 tablet 3  . carvedilol (COREG) 25 MG tablet TAKE 1 TABLET(25 MG) BY MOUTH TWICE DAILY 180 tablet 2  . Coenzyme Q10 200 MG capsule Take 1 capsule (200 mg total) by mouth daily. 90 capsule 3  . digoxin (LANOXIN) 0.125 MG tablet TAKE 1 TABLET EVERY DAY 90 tablet 1  . digoxin (LANOXIN) 0.125 MG tablet Take 1 tablet (0.125 mg total) by mouth daily. 90 tablet 3  . finasteride (PROPECIA) 1 MG tablet Take 1 tablet (1 mg total) by mouth daily. 30 tablet 2  . Multiple Vitamins-Minerals (PRESERVISION AREDS 2+MULTI VIT PO) Take 1 tablet by mouth in the morning and at bedtime.    . mupirocin ointment (BACTROBAN) 2 % APPLY EXTERNALLY TO FACE EVERY DAY AS NEEDED    . pantoprazole (PROTONIX) 40 MG tablet Take 1 tablet (40 mg total) by  mouth 2 (two) times daily. 180 tablet 3  . patiromer (VELTASSA) 8.4 g packet Take 1 packet (8.4 g total) by mouth daily. 30 each 5  . Rivaroxaban (XARELTO) 15 MG TABS tablet TAKE 1 TABLET BY MOUTH DAILY WITH SUPPER 90 tablet 3  . sacubitril-valsartan (ENTRESTO) 97-103 MG Take 1 tablet by mouth 2 (two) times daily. 60 tablet 6  . spironolactone (ALDACTONE) 25 MG tablet Take 1 tablet (25 mg total) by mouth daily. 90 tablet 3  . temazepam (RESTORIL) 15 MG capsule Take 1 capsule (15 mg total) by mouth at bedtime as needed for sleep. 30 capsule 2  . torsemide (DEMADEX) 20 MG tablet Take 1 tablet (20 mg total) by mouth every other day. 45 tablet 3  . traMADol (ULTRAM) 50 MG tablet TAKE 1 TAB BY MOUTH 3 TIMES DAILY AS NEEDED FOR MODERATE OR SEVERE ARTHRITIS PAIN IN HANDS AND NECK 90 tablet 3   No current facility-administered medications for this visit.    Allergies:   Atorvastatin, Repatha [evolocumab], and Rosuvastatin   Social History:  The patient  reports that he quit smoking about 33 years ago. He has a 37.50 pack-year smoking history. He has never used smokeless tobacco. He reports previous alcohol use of about 1.0 standard drink of alcohol per week. He reports that he does not use drugs.   Family History:  The patient's family history includes Lung cancer (age of onset: 41) in his mother; Stroke in his brother and mother; Stroke (age of onset: 52) in his father.  ROS:  Please see the history of present illness.    All other systems are reviewed and otherwise negative.   PHYSICAL EXAM:  VS:  There were no vitals taken for this visit. BMI: There is no height or weight on file to calculate BMI. Well nourished, well developed, in no acute distress  HEENT: normocephalic, atraumatic  Neck: no JVD, carotid bruits or masses Cardiac:  RRR; (paced), no significant murmurs, no rubs, or gallops Lungs:  CTA b/l, no wheezing, rhonchi or rales  Abd: soft, nontender MS: no deformity or atrophy Ext: no  edema  Skin: warm and dry, no rash Neuro:  No gross deficits appreciated Psych: euthymic mood, full affect  PPM site: is stable, no erythema, edema, fluctuation or tethering   EKG:  Not done today   PPM interrogation done today and reviewed by myself: (with industry support)  Initial interrogation  Battery estimate is 7.0 years RV measurements are good He is pacer dependent 97%VP 84.4% effective OptiVol well below threshold  LV is programmed LV1 to can, threshold today 0.75V/0.23ms (programmed output is 2.5V/1.5ms) EKG +lead I and R in V1, QRS 140ms  Can-3 programming had threshold of 2.25V/0.48ms (his prior vector) EKG noted + lead I and R V1, QRS 120ms   LV lead was checked at length, he had better thresholds though with worse looking EKGs with other vectors Also checked a few at adjusted offset, though nothing better the Can-3 by EKG   07/18/2019: TTE IMPRESSIONS  1. Abnormal GLS -10.4. Left ventricular ejection fraction, by estimation,  is 35 to 40%. The left ventricle has moderately decreased function. The  left ventricle demonstrates global hypokinesis. The left ventricular  internal cavity size was moderately  dilated. Left ventricular diastolic parameters were normal.  2. Pacing wire in RA/RV . Right ventricular systolic function is mildly  reduced. The right ventricular size is mildly enlarged. There is  moderately elevated pulmonary artery systolic pressure.  3. Left atrial size was moderately dilated.  4. Right atrial size was mildly dilated.  5. The mitral valve is normal in structure and function. Mild mitral  valve regurgitation. No evidence of mitral stenosis.  6. In some views appars to be sub valvular calcium but suspect this is  shadowing artifact Cannot r/o severe AS with low EF Calculated CO is 4  liters/min but only 2.1 L/min/m2. The aortic valve is normal in structure  and function. Aortic valve  regurgitation is mild to moderate. Moderate  aortic valve stenosis.  7. The inferior vena cava is normal in size with greater than 50%  respiratory variability, suggesting right atrial pressure of 3 mmHg.    12/14/2018  IMPRESSIONS  1. The left ventricle has severely reduced systolic function, with an ejection fraction of 20-25%. The cavity size was mildly dilated. Left ventricular diastolic Doppler parameters are consistent with restrictive filling. Elevated mean left atrial  pressure.  2. The right ventricle has moderately reduced systolic function. The cavity was normal. There is no increase in right ventricular wall thickness. Right ventricular systolic pressure is moderately elevated with an estimated pressure of 59.9 mmHg.  3. Left atrial size was moderately dilated.  4. Right atrial size was severely dilated.  5. The mitral valve is degenerative. Mild thickening of the mitral valve leaflet. There is mild mitral annular calcification present.  6. Tricuspid valve regurgitation is moderate-severe.  7. The aortic valve is bicuspid. Severely thickening of the aortic valve. Severe calcifcation of the aortic valve. Aortic valve regurgitation is moderate by color flow Doppler. No stenosis of the aortic valve.  8. Morphologically, the degree of aortic stenosis appears worse than Doppler measurements (Peak velocity 1.8m/s, mean gradient 76mmHg).     Demensionless index is 0.62.  9. The inferior vena cava was dilated in size with <50% respiratory variability. 10. The average left ventricular global longitudinal strain is -4.2 %.      Recent Labs: 12/13/2019: Hemoglobin 12.8; Platelets 171 01/05/2020: BUN 40; Creatinine, Ser 1.69; Potassium 5.0; Sodium 131  12/06/2019: Chol/HDL Ratio 2.7; Cholesterol, Total 154; HDL 58; LDL Chol Calc (NIH) 77; LDL Direct 84; Triglycerides 103   CrCl cannot be calculated (Patient's most recent lab result is older than the maximum 21 days allowed.).   Wt Readings from Last 3 Encounters:  12/13/19 171 lb  (77.6 kg)  10/12/19 166 lb 6 oz (75.5 kg)  07/18/19 171  lb 12.8 oz (77.9 kg)     Other studies reviewed: Additional studies/records reviewed today include: summarized above  ASSESSMENT AND PLAN:  1. PPM     As above     We left his LV lead can-3, 2.75V/0.30ms, batter estimate 7.2 years (at least as far as today goes, no change in longevity  The patient has had marked symptomatic im,provement, he has had some improvement in his EF by his last echo despite 85% effective pacing. I discussed what we did and saw today, his improvement in QOL and clinical status.  I will send my note/findings to [name] and [name]   2. Permanent Afib     CHA2DS2Vasc is 4, on xarelto, appropriately dosed     Now s/p AV node ablation    3. NICM     s/p CRT-P     No symptoms or exam findings of fluid OL, weight is stable he weighs every day at home     Follows with [name]  4. HLD     He reports developing some muscle aches with the Nexlizet and stopped it 2 weeks ago     He was scheduled for labs via [facility], will get them done today Chemistry and lipid panel    Disposition: He sees [name] soon, we will continue remotes Q 69mo, have him see [name] in a couple months to revisit clinical status and device programming.   Current medicines are reviewed at length with the patient today.  The patient did not have any concerns regarding medicines.  Venetia Night, PA-C 02/02/2020 5:46 AM     [facility] [address] [place] [address] [phone] (office)  [phone] (fax)

## 2020-02-06 ENCOUNTER — Telehealth: Payer: Self-pay | Admitting: Pharmacist

## 2020-02-06 DIAGNOSIS — E785 Hyperlipidemia, unspecified: Secondary | ICD-10-CM

## 2020-02-06 NOTE — Telephone Encounter (Signed)
Lipid panel checked last week and LDL had increased to 118. Pt was started on Nexlizet 2 months ago due to previous intolerances to rosuvastatin 5mg  daily, atorvastatin 10mg  and 40mg  daily, and Repatha (myalgias with all).   His LDL goal is < 70 due to elevated calcium score. He saw Tommye Standard last week and stated he stopped his Nexlizet 2 weeks ago due to muscle aches in his legs.  Called pt to discuss results. He states he stopped Nexlizet for a few weeks to see if cramps in his legs improved. He has been off of the Nexlizet for 3 weeks and reports the cramps in his legs improved.   Discussed trying lower dose of Praluent, pravastatin, ezetimibe, or Nexletol. Pt states he wishes to rechallenge with his Nexlizet first. He will call clinic if his aches return and we can consider one of the above options at that time.

## 2020-02-15 NOTE — Addendum Note (Signed)
Addended by: Marcelle Overlie D on: 02/15/2020 07:48 AM   Modules accepted: Orders

## 2020-02-16 ENCOUNTER — Other Ambulatory Visit: Payer: Self-pay | Admitting: Family Medicine

## 2020-02-17 NOTE — Telephone Encounter (Signed)
LR: 10-20-2019 Qty: 90 with 3 refills Last office visit: 08-16-2018 Upcoming appointment: 03-15-2020

## 2020-02-21 DIAGNOSIS — Z23 Encounter for immunization: Secondary | ICD-10-CM | POA: Diagnosis not present

## 2020-03-05 ENCOUNTER — Other Ambulatory Visit: Payer: Medicare Other

## 2020-03-07 ENCOUNTER — Telehealth: Payer: Self-pay | Admitting: Family Medicine

## 2020-03-07 NOTE — Telephone Encounter (Signed)
Left message for patient to call back and schedule Medicare Annual Wellness Visit (AWV) either virtually/audio only OR in office. Whatever the patients preference is.  Last AWV 02/07/19; please schedule at anytime with [facility]Nurse Health Advisor at [facility].  This should be a 45 minute visit.

## 2020-03-13 NOTE — Progress Notes (Signed)
Phone [phone] In person visit   Subjective:   Travis [name] is a 79 y.o. year old very pleasant male patient who presents for/with See problem oriented charting Chief Complaint  Patient presents with  . Follow-up   This visit occurred during the SARS-CoV-2 public health emergency.  Safety protocols were in place, including screening questions prior to the visit, additional usage of staff PPE, and extensive cleaning of exam room while observing appropriate contact time as indicated for disinfecting solutions.   Past Medical History-  Patient Active Problem List   Diagnosis Date Noted  . GI bleed 02/06/2018    Priority: High  . Chronic systolic heart failure ([place]) 10/10/2017    Priority: High  . Persistent atrial fibrillation 10/06/2017    Priority: High  . Pulmonary hypertension ([place]) 03/09/2012    Priority: High  . Bicuspid aortic valve     Priority: High  . Cardiomyopathy, rate related with resolution now recurrent 12/08/2011    Priority: High  . Osteoarthritis 02/04/2007    Priority: High  . Hyperlipidemia 12/19/2014    Priority: Medium  . Anxiety state 03/06/2014    Priority: Medium  . Insomnia 03/06/2014    Priority: Medium  . Sinus bradycardia 01/04/2013    Priority: Medium  . Hyponatremia 02/21/2012    Priority: Medium  . Hypertension     Priority: Medium  . Left bundle branch block 12/10/2010    Priority: Medium  . Symptomatic anemia 08/01/2015    Priority: Low  . Male pattern baldness 03/06/2014    Priority: Low  . Overweight (BMI 25.0-29.9) 02/08/2014    Priority: Low  . S/P TKR (total knee replacement) 02/08/2014    Priority: Low  . DRY MOUTH 11/07/2009    Priority: Low  . Carotid bruit 07/05/2009    Priority: Low  . GERD 01/07/2008    Priority: Low  . GANGLION CYST, WRIST, LEFT 02/04/2007    Priority: Low  . Statin myopathy 12/06/2019  . Acute blood loss anemia 03/01/2018  . Pyloric stenosis, acquired 02/28/2018  . Duodenal ulcer, with  obstruction 02/28/2018    Medications- reviewed and updated Current Outpatient Medications  Medication Sig Dispense Refill  . acetaminophen (TYLENOL) 500 MG tablet Take 1,000 mg by mouth every 8 (eight) hours as needed for moderate pain or headache.    . carvedilol (COREG) 25 MG tablet TAKE 1 TABLET(25 MG) BY MOUTH TWICE DAILY 180 tablet 2  . Coenzyme Q10 200 MG capsule Take 1 capsule (200 mg total) by mouth daily. 90 capsule 3  . digoxin (LANOXIN) 0.125 MG tablet Take 0.5 tablets (0.0625 mg total) by mouth daily. 45 tablet 0  . ezetimibe (ZETIA) 10 MG tablet Take 1 tablet (10 mg total) by mouth daily. 90 tablet 3  . finasteride (PROPECIA) 1 MG tablet Take 1 tablet (1 mg total) by mouth daily. 30 tablet 2  . Multiple Vitamins-Minerals (PRESERVISION AREDS 2+MULTI VIT PO) Take 1 tablet by mouth in the morning and at bedtime.    . mupirocin ointment (BACTROBAN) 2 % APPLY EXTERNALLY TO FACE EVERY DAY AS NEEDED    . pantoprazole (PROTONIX) 40 MG tablet Take 1 tablet (40 mg total) by mouth 2 (two) times daily. 180 tablet 3  . patiromer (VELTASSA) 8.4 g packet Take 1 packet (8.4 g total) by mouth daily. 30 each 5  . Rivaroxaban (XARELTO) 15 MG TABS tablet TAKE 1 TABLET BY MOUTH DAILY WITH SUPPER 90 tablet 3  . sacubitril-valsartan (ENTRESTO) 97-103 MG Take 1  tablet by mouth 2 (two) times daily. 60 tablet 6  . spironolactone (ALDACTONE) 25 MG tablet Take 1 tablet (25 mg total) by mouth daily. 90 tablet 3  . temazepam (RESTORIL) 15 MG capsule Take 1 capsule (15 mg total) by mouth at bedtime as needed for sleep. 30 capsule 5  . torsemide (DEMADEX) 20 MG tablet Take 1 tablet (20 mg total) by mouth every other day. 45 tablet 3  . traMADol (ULTRAM) 50 MG tablet TAKE 1 TAB BY MOUTH 3 TIMES DAILY AS NEEDED FOR MODERATE OR SEVERE ARTHRITIS PAIN IN HANDS AND NECK 90 tablet 0  . fluticasone (FLONASE) 50 MCG/ACT nasal spray Place 2 sprays into both nostrils daily. 16 g 3   No current facility-administered  medications for this visit.     Objective:  BP 122/66   Pulse 70   Temp 97.6 F (36.4 C) (Temporal)   Resp 18   Ht 5\' 8"  (1.727 m)   Wt 173 lb 12.8 oz (78.8 kg)   SpO2 98%   BMI 26.43 kg/m  Gen: NAD, resting comfortably CV: irregularly irregular Lungs: CTAB no crackles, wheeze, rhonchi Ext: trace edema Skin: warm, dry    Assessment and Plan  # Cardiology Follow Up/update S: Patient reports 2 years of issues with heart failure and atrial fibrillation have been challenging for him. Pacemaker put in. Still feels weak. Tough last 2 years. Has not gotten his strength back. Lost a lot of muscle mass with illnesses. Back in gym twice a week and may work on 3x a week.   For heart failure-compliant with torsemide 20 mg every other day, carvedilol 25 mg twice daily, digoxin, Entresto, spironolactone  For atrial fibrillation patient is on carvedilol as listed above 25 mg twice daily, for anticoagulation he is on Xarelto 15 mg once daily renally adjusted-remains in atrial fibrillation A/P: CHF appears stable-continue current medication.  Thankfully he is trying to advance with exercise/trainer  For atrial fibrillation-appropriately anticoagulated and rate controlled-continue current medication  #Insomnia-patient reports taking glass of wine around 9 PM or before.  Usually wakes up from sleep around 2-3 AM and will take temazepam at that time.  We discussed avoiding driving 8 hours after taking temazepam.  I like the fact that he is spacing alcohol from temazepam by at least 5 to 6 hours-honestly would prefer that he not drink at all-wonder if he could move temazepam to bedtime and still be able to sleep-discuss next visit  #hyperlipidemia S: Medication:zetia starting yesterday through cardiology 10 mg Failed statins, repatha, multiple other options- weakness mainly in legs with these Lab Results  Component Value Date   CHOL 190 02/02/2020   HDL 55 02/02/2020   LDLCALC 118 (H) 02/02/2020    LDLDIRECT 84 12/06/2019   TRIG 94 02/02/2020   CHOLHDL 3.5 02/02/2020   A/P: Statin intolerant and injectable intolerant-hoping Zetia will give some benefit-would prefer LDL under 100 at minimum but under 70 would be more ideal  # congestion/runny nose S:a year of issues at least. Concerned could be a medication he iis on.    Seasonal watery/itchy eyes but not all the time like the runny nose/congestion.  A/P: Could be irritant at home or allergic rhinitis.  Some of his medications like finasteride list 1% chance of rhinitis.  Also discussed Entresto could produce some cough From AVS:  " Trial flonase for a month to see if it helps with congestion/runny nose.  "   # constipation- uses miralax 3-4 x a week  and helpful  #Chronic kidney disease stage III S: GFR is typically in the 40s range -Patient knows to avoid NSAIDs-also because he is on Xarelto A/P: Just had labs yesterday-continue to trend.  Noted chronically low sodium-could be in relation to CHF medications-on the other hand has been a chronic issue and has seen endocrinology in the past with extensive work-up performed  #Slightly macrocytic anemia Lab Results  Component Value Date   VITAMINB12 202 02/26/2018  Low normal b12- I want you to take b12 daily for a month 1000 mcg over the counter and then change to once a week after that.   Recommended follow up: Discussed 67-month follow-up will be reasonable Future Appointments  Date Time Provider [place]  03/21/2020 11:00 AM Larey Dresser, MD [facility] None  04/18/2020 11:00 AM Deboraha Sprang, MD [facility]  04/24/2020  8:40 AM [facility] DEVICE REMOTES [facility]  07/17/2020 10:20 AM Larey Dresser, MD [facility] None  07/24/2020  8:40 AM [facility] DEVICE REMOTES [facility]    Lab/Order associations:   ICD-10-CM   1. Persistent atrial fibrillation  I48.19   2. Chronic systolic heart failure (HCC)  I50.22   3.  Insomnia, unspecified type  G47.00   4. Primary hypertension  I10   5. Need for immunization against influenza  Z23 Flu Vaccine QUAD High Dose(Fluad)    Meds ordered this encounter  Medications  . fluticasone (FLONASE) 50 MCG/ACT nasal spray    Sig: Place 2 sprays into both nostrils daily.    Dispense:  16 g    Refill:  3  . temazepam (RESTORIL) 15 MG capsule    Sig: Take 1 capsule (15 mg total) by mouth at bedtime as needed for sleep.    Dispense:  30 capsule    Refill:  5     Return precautions advised.  [name], MD

## 2020-03-13 NOTE — Patient Instructions (Addendum)
Health Maintenance Due  Topic Date Due   TETANUS/TDAP - consider getting this done at your pharmacy because it is cheaper there 04/17/2014   INFLUENZA VACCINE - high dose today 12/25/2019   Please stop by lab before you go If you have mychart- we will send your results within 3 business days of [place] receiving them.  If you do not have mychart- we will call you about results within 5 business days of [place] receiving them.  *please note we are currently using [facility] which has a longer processing time than  typically so labs may not come back as quickly as in the past *please also note that you will see labs on mychart as soon as they post. I will later go in and write notes on them- will say "notes from Dr. Yong Channel"  Trial flonase for a month to see if it helps with congestion/runny nose.   Low normal b12- I want you to take b12 daily for a month 1000 mcg over the counter and then change to once a week after that.

## 2020-03-14 ENCOUNTER — Telehealth (HOSPITAL_COMMUNITY): Payer: Self-pay

## 2020-03-14 ENCOUNTER — Ambulatory Visit (HOSPITAL_COMMUNITY)
Admission: RE | Admit: 2020-03-14 | Discharge: 2020-03-14 | Disposition: A | Discharge status: Home / Self Care | Payer: Medicare Other | Source: Ambulatory Visit | Attending: Cardiology | Admitting: Cardiology

## 2020-03-14 ENCOUNTER — Encounter (HOSPITAL_COMMUNITY): Payer: Self-pay | Admitting: Cardiology

## 2020-03-14 ENCOUNTER — Other Ambulatory Visit: Payer: Self-pay

## 2020-03-14 VITALS — BP 102/60 | HR 69 | Wt 178.0 lb

## 2020-03-14 DIAGNOSIS — Z79899 Other long term (current) drug therapy: Secondary | ICD-10-CM | POA: Insufficient documentation

## 2020-03-14 DIAGNOSIS — I2510 Atherosclerotic heart disease of native coronary artery without angina pectoris: Secondary | ICD-10-CM | POA: Insufficient documentation

## 2020-03-14 DIAGNOSIS — Z87891 Personal history of nicotine dependence: Secondary | ICD-10-CM | POA: Diagnosis not present

## 2020-03-14 DIAGNOSIS — E785 Hyperlipidemia, unspecified: Secondary | ICD-10-CM | POA: Diagnosis not present

## 2020-03-14 DIAGNOSIS — N183 Chronic kidney disease, stage 3 unspecified: Secondary | ICD-10-CM | POA: Diagnosis not present

## 2020-03-14 DIAGNOSIS — Z801 Family history of malignant neoplasm of trachea, bronchus and lung: Secondary | ICD-10-CM | POA: Diagnosis not present

## 2020-03-14 DIAGNOSIS — I4819 Other persistent atrial fibrillation: Secondary | ICD-10-CM | POA: Insufficient documentation

## 2020-03-14 DIAGNOSIS — I5022 Chronic systolic (congestive) heart failure: Secondary | ICD-10-CM

## 2020-03-14 DIAGNOSIS — Z7901 Long term (current) use of anticoagulants: Secondary | ICD-10-CM | POA: Diagnosis not present

## 2020-03-14 DIAGNOSIS — Z8711 Personal history of peptic ulcer disease: Secondary | ICD-10-CM | POA: Diagnosis not present

## 2020-03-14 DIAGNOSIS — I447 Left bundle-branch block, unspecified: Secondary | ICD-10-CM | POA: Diagnosis not present

## 2020-03-14 DIAGNOSIS — I130 Hypertensive heart and chronic kidney disease with heart failure and stage 1 through stage 4 chronic kidney disease, or unspecified chronic kidney disease: Secondary | ICD-10-CM | POA: Diagnosis not present

## 2020-03-14 LAB — BASIC METABOLIC PANEL
Anion gap: 10 (ref 5–15)
BUN: 33 mg/dL — ABNORMAL HIGH (ref 8–23)
CO2: 21 mmol/L — ABNORMAL LOW (ref 22–32)
Calcium: 8.8 mg/dL — ABNORMAL LOW (ref 8.9–10.3)
Chloride: 100 mmol/L (ref 98–111)
Creatinine, Ser: 1.48 mg/dL — ABNORMAL HIGH (ref 0.61–1.24)
GFR, Estimated: 44 mL/min — ABNORMAL LOW (ref >60)
Glucose, Bld: 121 mg/dL — ABNORMAL HIGH (ref 70–99)
Potassium: 4.7 mmol/L (ref 3.5–5.1)
Sodium: 131 mmol/L — ABNORMAL LOW (ref 135–145)

## 2020-03-14 LAB — DIGOXIN LEVEL: Digoxin Level: 1.2 ng/mL (ref 1.0–2.0)

## 2020-03-14 MED ORDER — DIGOXIN 125 MCG PO TABS: 0.0625 mg | ORAL_TABLET | Freq: Every day | ORAL | 0 refills | Status: DC

## 2020-03-14 MED ORDER — EZETIMIBE 10 MG PO TABS: 10.0000 mg | ORAL_TABLET | Freq: Every day | ORAL | 3 refills | Status: DC

## 2020-03-14 NOTE — Patient Instructions (Signed)
Labs done today. We will contact you only if your labs are abnormal.  START Zetia 10mg ( 1 tablet) by mouth daily.  No other medication changes were made. Please continue all other medications as prescribed.  Your physician recommends that you schedule a follow-up appointment in: 4 months  If you have any questions or concerns before your next appointment please send [place] a message through [place] or call our office at [phone].    TO LEAVE A MESSAGE FOR THE NURSE SELECT OPTION 2, PLEASE LEAVE A MESSAGE INCLUDING: . YOUR NAME . DATE OF BIRTH . CALL BACK NUMBER . REASON FOR CALL**this is important as we prioritize the call backs  [place] AS LONG AS YOU CALL BEFORE 4:00 PM   At the [facility], you and your health needs are our priority. As part of our continuing mission to provide you with exceptional heart care, we have created designated Provider Care Teams. These Care Teams include your primary Cardiologist (physician) and Advanced Practice Providers (APPs- Physician Assistants and Nurse Practitioners) who all work together to provide you with the care you need, when you need it.   You may see any of the following providers on your designated Care Team at your next follow up: Marland Kitchen Dr Glori Bickers . Dr [name] . Darrick Grinder, NP . Lyda Jester, PA . Audry Riles, PharmD   Please be sure to bring in all your medications bottles to every appointment.

## 2020-03-14 NOTE — Telephone Encounter (Signed)
-----   Message from Larey Dresser, MD sent at 03/14/2020  4:01 PM EDT ----- Decrease digoxin to 0.0625 mg daily. Level again next week.

## 2020-03-14 NOTE — Telephone Encounter (Signed)
Patient advised and verbalized understanding. Med list updated to reflect changes.   Meds ordered this encounter  Medications   digoxin (LANOXIN) 0.125 MG tablet    Sig: Take 0.5 tablets (0.0625 mg total) by mouth daily.    Dispense:  45 tablet    Refill:  0    Please cancel all previous orders for current medication. Change in dosage or pill size.   Orders Placed This Encounter  Procedures   Digoxin level    Standing Status:   Future    Standing Expiration Date:   03/14/2021    Order Specific Question:   Release to patient    Answer:   Immediate

## 2020-03-14 NOTE — Progress Notes (Signed)
Date:  03/14/2020   ID:  Travis [name], DOB 1940-08-14, MRN [id number]   Provider location: [facility] Type of Visit: Established patient   PCP:  Travis Olp, MD  Cardiologist: Travis [name] EP: Travis [name]/Travis [name] Comes   History of Present Illness: Travis [name] is a 79 y.o. male with h/o persistent atrial fibrillation s/p multiple failed DCCV, failed Tikosyn, ERAF after atrial fibrillation ablation, bicuspid AV with moderate AS and Mild AI, HTN, HLD, and chronic systolic CHF (EF 37-10%) with presumed tachy-mediated CMP.   Pt failed DCCV 08/14/17, 08/31/17, and 09/17/17. Failed Tikosyn. Did have conversion with Amiodarone and DCCV. Pt underwent successful ablation 10/07/17.  However, he had ERAF.  Admitted 5/18 - 10/14/17 with atrial fibrillation/RVR and acute on chronic systolic CHF.  Echo showed EF 30-35%. Diuresed and reloaded with Amio. Underwent successful DCCV 10/13/17.    Echo in 9/19 showed improvement in EF to 50% in NSR.   In 9/19, patient reported profound fatigue.  He was volume overloaded on exam and diuretic was increased.  CBC was done, hgb was low and patient ended up going to the ER for admission.  He was diagnosed with UGIB likely from duodenal ulcer, probably due to meloxicam.  This was stopped.   Again in 10/19, he was profoundly fatigued.  CBC showed hgb 6.7, he was sent to the ER where he had 2 unit transfusion and EGD was done again, showing duodenal stenosis.  I also stopped his atorvastatin.   He went back into atrial fibrillation and had successful TEE-DCCV in 2/20.  TEE showed EF 35-40% with mildly decreased RV systolic function, EF back down in setting of atrial fibrillation.  In 3/20, he was back in atrial fibrillation and has been in atrial fibrillation since then.  He saw Dr. Rayann [name] and it was decided not to pursue redo ablation.  Repeat echo in 7/20 showed EF 20-25%, moderately decreased RV systolic function, moderate-severe TR,  bicuspid aortic valve with moderate AI, dilated IVC.  Due to permanent atrial fibrillation and rapid rate at times with LBBB, it was decided to pursue AV nodal ablation with BiV pacing. He had Medtronic CRT-P placement in 8/20, then AV nodal ablation in 9/20.   Echo in 2/21 showed EF 35-40% with global hypokinesis, mildly decreased RV systolic function, mild MR, mild-moderate AI with moderate AS.   He has not tolerated Repatha or Nexlizet due to muscle weakness.    Patient returns for followup of CHF and atrial fibrillation.  He remains in atrial fibrillation with BiV pacing s/p AV nodal ablation.  He is symptomatically doing well.  Device adjustment by EP, he has a higher CRT percentage now.  No significant exertional dyspnea.  Still fatigues more easily than in the past.  Goes to gym 3 times/week.  No chest pain, no lightheadedness, no orthopnea/PND.   ECG (personally reviewed): Atrial fibrillation with BiV pacing  Medtronic device interrogation: >99% BiV pacing.  Stable thoracic impedance.   Labs (3/19): LDL 65 Labs (8/19): K 5.2 => 4.1, creatinine 1.6 Labs (9/19): K 3.9, creatinine 1.78, hgb 9.6, TSH normal.  Labs (10/19): hgb 6.7 => 11.8, LFTs normal, K 3.6, creatinine 1.3 Labs (11/19): LFTs, TSH normal Labs (1/20): K 4, creatinine 1.15, hgb 14.5 Labs (2/20): Hgb 13.4 Labs (4/20): LFTs normal, TSH elevated Labs (6/20): LDL 38, HDL 64, K 4.2, creatinine 1.45, hgb 14.7 Labs (7/20): K 4.4, creatinine 1.43, TSH and free T3 normal, mildly elevated free  T4, LFTs normal Labs (8/20): K 4.9, creatinine 1.26, digoxin 0.9 Labs (9/20): K 4.3, creatinine 1.44 Labs (12/20): digoxin 0.7, K 5, creatinine 1.36 Labs (3/21): K 4.7, creatinine 1.31 Labs (7/21): K 4.7, creatinine 1.49, LDL 84 Labs (9/21): K 4.9, creatinine 1.49, LDL 118  Past Medical History 1. Chronic systolic CHF: Possible tachy-mediated cardiomyopathy.    - Echo(08/13/2017): LVEF 30-35%, moderate AS, mild AI, severe LAE, RV mild  dilated with systolic function mild/mod reduced, PA peak pressure 46 mm Hg. - Echo (9/19): EF 50%, mild diffuse hypokinesis, normal RV size and systolic function, PASP 70 mmHg, moderate AS, moderate AI, moderate MR.  - TEE (2/20): EF 35-40%, mild-moderate LV dilation, normal RV size with mildly decreased systolic function, mil MR, trileaflet aortic valve with mild AS (mean gradient 10 mmHg, AVA 1.7 cm^2), moderate AI.  - Echo (7/20): EF 20-25%, moderately decreased RV systolic function, moderate-severe TR, bicuspid aortic valve with moderate AI, dilated IVC.  - Medtronic CRT-P placement 8/20.  - AV nodal ablation 9/20 - Echo (2/21): EF 35-40% with global hypokinesis, mildly decreased RV systolic function, mild MR, mild-moderate AI with moderate AS.  2. Atrial fibrillation: Has been difficult to control.  Failed Tikosyn and multiple DCCVs.  Failed amiodarone prior to ablation.  Atrial fibrillation ablation but then had ERAF.  He was cardioverted again in 5/19 with return to NSR.  DCCV again in 2/20 but back in atrial fibrillation in 3/20.  3. Aortic valve disorder: TEE 2/20 with mild AS, moderate AI (valve is not bicuspid).   - Echo 2/21 with mild-moderate AI, moderate AS.  4. HTN 5. Hyperlipidemia: Severe fatigue with atorvastatin, myalgias with Crestor.  6. Mitral regurgitation: Mild on last echo.  7. CAD: coronary calcium noted on chest CT.  8. CKD: Stage 3.  9. Carotid stenosis: Carotid dopplers (2/77) with 82-42% LICA stenosis.  - Carotid dopplers (3/53): 61-44% LICA stenosis.  - Carotid dopplers (3/15): 40-08% LICA stenosis.  10. Chronic LBBB 11. PUD: UGIB in 9/19 with duodenal ulcer probably due to meloxicam use.  - EGD (10/19): Duodenal stenosis.  12. Inguinal hernia 13. Suspected left subclavian stenosis with unequal pressures in arms.    Current Outpatient Medications  Medication Sig Dispense Refill  . acetaminophen (TYLENOL) 500 MG tablet Take 1,000 mg by mouth every 8 (eight)  hours as needed for moderate pain or headache.    . carvedilol (COREG) 25 MG tablet TAKE 1 TABLET(25 MG) BY MOUTH TWICE DAILY 180 tablet 2  . Coenzyme Q10 200 MG capsule Take 1 capsule (200 mg total) by mouth daily. 90 capsule 3  . finasteride (PROPECIA) 1 MG tablet Take 1 tablet (1 mg total) by mouth daily. 30 tablet 2  . Multiple Vitamins-Minerals (PRESERVISION AREDS 2+MULTI VIT PO) Take 1 tablet by mouth in the morning and at bedtime.    . mupirocin ointment (BACTROBAN) 2 % APPLY EXTERNALLY TO FACE EVERY DAY AS NEEDED    . pantoprazole (PROTONIX) 40 MG tablet Take 1 tablet (40 mg total) by mouth 2 (two) times daily. 180 tablet 3  . patiromer (VELTASSA) 8.4 g packet Take 1 packet (8.4 g total) by mouth daily. 30 each 5  . Rivaroxaban (XARELTO) 15 MG TABS tablet TAKE 1 TABLET BY MOUTH DAILY WITH SUPPER 90 tablet 3  . sacubitril-valsartan (ENTRESTO) 97-103 MG Take 1 tablet by mouth 2 (two) times daily. 60 tablet 6  . spironolactone (ALDACTONE) 25 MG tablet Take 1 tablet (25 mg total) by mouth daily. 90 tablet  3  . temazepam (RESTORIL) 15 MG capsule Take 1 capsule (15 mg total) by mouth at bedtime as needed for sleep. 30 capsule 2  . torsemide (DEMADEX) 20 MG tablet Take 1 tablet (20 mg total) by mouth every other day. 45 tablet 3  . traMADol (ULTRAM) 50 MG tablet TAKE 1 TAB BY MOUTH 3 TIMES DAILY AS NEEDED FOR MODERATE OR SEVERE ARTHRITIS PAIN IN HANDS AND NECK 90 tablet 0  . digoxin (LANOXIN) 0.125 MG tablet Take 0.5 tablets (0.0625 mg total) by mouth daily. 45 tablet 0  . ezetimibe (ZETIA) 10 MG tablet Take 1 tablet (10 mg total) by mouth daily. 90 tablet 3   No current facility-administered medications for this encounter.    Allergies:   Atorvastatin, Nexlizet [bempedoic acid-ezetimibe], Repatha [evolocumab], and Rosuvastatin   Social History:  The patient  reports that he quit smoking about 33 years ago. He has a 37.50 pack-year smoking history. He has never used smokeless tobacco. He  reports previous alcohol use of about 1.0 standard drink of alcohol per week. He reports that he does not use drugs.   Family History:  The patient's family history includes Lung cancer (age of onset: 38) in his mother; Stroke in his brother and mother; Stroke (age of onset: 65) in his father.   ROS:  Please see the history of present illness.   All other systems are personally reviewed and negative.   Exam:   BP 102/60   Pulse 69   Wt 80.7 kg (178 lb)   SpO2 97%   BMI 27.06 kg/m  General: NAD Neck: No JVD, no thyromegaly or thyroid nodule.  Lungs: Clear to auscultation bilaterally with normal respiratory effort. CV: Nondisplaced PMI.  Heart regular S1/S2, no S3/S4, 2/6 SEM RUSB.  No peripheral edema.  No carotid bruit.  Normal pedal pulses.  Abdomen: Soft, nontender, no hepatosplenomegaly, no distention.  Skin: Intact without lesions or rashes.  Neurologic: Alert and oriented x 3.  Psych: Normal affect. Extremities: No clubbing or cyanosis.  HEENT: Normal.  Recent Labs: 12/13/2019: Hemoglobin 12.8; Platelets 171 02/02/2020: ALT 11 03/14/2020: BUN 33; Creatinine, Ser 1.48; Potassium 4.7; Sodium 131  Personally reviewed   Wt Readings from Last 3 Encounters:  03/14/20 80.7 kg (178 lb)  02/02/20 78.5 kg (173 lb)  12/13/19 77.6 kg (171 lb)      ASSESSMENT AND PLAN:  1. Chronic systolic CHF: Echo 1/61 with EF 30-35%, mild to moderately decreased RV systolic function in setting of atrial fibrillation with RVR.  Echo in 9/19 with patient in NSR showed EF up to 50%, mild diffuse hypokinesis.  Back in atrial fibrillation in 2/20, TEE showed EF 35-40%.  Suspect he has atrial fibrillation/tachycardia-mediated cardiomyopathy given fall in EF with recurrent atrial fibrillation.  Echo in 7/20 with EF 20-25%, moderately decreased RV systolic function.  He is now s/p Medtronic CRT-P placement with AV nodal ablation. Echo in 2/21 showed EF 35-40% (some improvement).  NYHA class II symptoms.  He is  not volume overloaded on exam or by Optivol.  Higher percentage of BiV pacing s/p optimization in device clinic.  - Continue Entresto 97/103 bid. BMET today.     - Continue digoxin 0.125, check level today.  - Continue Coreg 25 mg bid.     - Continue spironolactone 25 mg daily.  - Continue Veltassa to control K and allow spironolactone and Entresto use.  - Next visit consider SGLT2-inhibitor.  2. Atrial fibrillation: Persistent.  Difficult to control rate and  rhythm. He failed Tikosyn. He started on amiodarone, then had atrial fibrillation ablation in 5/19 with ERAF, requiring DCCV.  Most recently cardioverted in 2/20 but back in atrial fibrillation by 3/20 and has stayed in atrial fibrillation since.  He saw Dr. Rayann [name] and they decided against redo ablation. He is now s/p AV nodal ablation.    - He is off amiodarone given permanent atrial fibrillation.   - Continue Xarelto 15 mg daily.  3. CAD: Extensive coronary calcification on last CT chest. No chest pain. I suspect that his cardiomyopathy is tachycardia-mediated/atrial fibrillation-related given rise in EF in NSR and then fall when back in atrial fibrillation rather than ischemic cardiomyopathy.  - No ASA with Xarelto use. - Unable to tolerate statins, Repatha or Nexlizet.  I will try him on Zetia with lipids in 3 months.     4. CKD: Stage 3. BMET today.  5. Aortic valve disease: TEE in 2/20 showed that aortic valve was not bicuspid (though TTE reports have read bicuspid) but did have mild stenosis and moderate AI.  Moderate AI on 7/20 echo.  Echo in 2/21 showed mild-moderate AI, moderate AS. Follow the valve closely over time.  6. Tricuspid regurgitation: Moderate-severe on 7/20 echo, not seen on 2/21 echo.   7. LBBB: Chronic, now s/p CRT.  8. Hyperlipidemia: Unable to tolerate statins, Nexlizet, or Repatha.   - Try Zetia 10 mg daily.  9. Carotid stenosis: Repeat carotid dopplers in 6/22.   10. Subclavian stenosis: Suspect left  subclavian stenosis.  Not noted on carotid dopplers, but large pulse differential between the arm and difficult to palpate left radial pulse.  Seems asymptomatic.  - Medical management, control lipids.  - Take BP in right arm.   Recommended follow-up:  4 months.   Signed, [name], MD  03/14/2020  [facility] [address] [facility] [place] [address] [phone] (office) [phone] (fax)

## 2020-03-15 ENCOUNTER — Ambulatory Visit (INDEPENDENT_AMBULATORY_CARE_PROVIDER_SITE_OTHER): Payer: Medicare Other | Admitting: Family Medicine

## 2020-03-15 ENCOUNTER — Encounter: Payer: Self-pay | Admitting: Family Medicine

## 2020-03-15 VITALS — BP 122/66 | HR 70 | Temp 97.6°F | Resp 18 | Ht 68.0 in | Wt 173.8 lb

## 2020-03-15 DIAGNOSIS — G4700 Insomnia, unspecified: Secondary | ICD-10-CM | POA: Diagnosis not present

## 2020-03-15 DIAGNOSIS — Z23 Encounter for immunization: Secondary | ICD-10-CM | POA: Diagnosis not present

## 2020-03-15 DIAGNOSIS — I4819 Other persistent atrial fibrillation: Secondary | ICD-10-CM | POA: Diagnosis not present

## 2020-03-15 DIAGNOSIS — I5022 Chronic systolic (congestive) heart failure: Secondary | ICD-10-CM | POA: Diagnosis not present

## 2020-03-15 DIAGNOSIS — I10 Essential (primary) hypertension: Secondary | ICD-10-CM

## 2020-03-15 DIAGNOSIS — I6523 Occlusion and stenosis of bilateral carotid arteries: Secondary | ICD-10-CM | POA: Diagnosis not present

## 2020-03-15 DIAGNOSIS — Z1159 Encounter for screening for other viral diseases: Secondary | ICD-10-CM

## 2020-03-15 MED ORDER — TEMAZEPAM 15 MG PO CAPS: 15.0000 mg | ORAL_CAPSULE | Freq: Every evening | ORAL | 5 refills | Status: DC | PRN

## 2020-03-15 MED ORDER — FLUTICASONE PROPIONATE 50 MCG/ACT NA SUSP
2.0000 | Freq: Every day | NASAL | 3 refills | Status: DC
Start: 2020-03-15 — End: 2020-11-09

## 2020-03-18 ENCOUNTER — Other Ambulatory Visit: Payer: Self-pay | Admitting: Family Medicine

## 2020-03-19 ENCOUNTER — Other Ambulatory Visit: Payer: Self-pay | Admitting: Family Medicine

## 2020-03-19 NOTE — Telephone Encounter (Signed)
Tramadol last rx 02/17/20 #90 LOV: 03/15/2020

## 2020-03-21 ENCOUNTER — Ambulatory Visit (HOSPITAL_COMMUNITY)
Admission: RE | Admit: 2020-03-21 | Discharge: 2020-03-21 | Disposition: A | Discharge status: Home / Self Care | Payer: Medicare Other | Source: Ambulatory Visit | Attending: Cardiology | Admitting: Cardiology

## 2020-03-21 ENCOUNTER — Other Ambulatory Visit: Payer: Self-pay

## 2020-03-21 DIAGNOSIS — I5022 Chronic systolic (congestive) heart failure: Secondary | ICD-10-CM | POA: Diagnosis not present

## 2020-03-21 LAB — DIGOXIN LEVEL: Digoxin Level: 0.6 ng/mL — ABNORMAL LOW (ref 1.0–2.0)

## 2020-03-22 ENCOUNTER — Ambulatory Visit: Payer: Medicare Other

## 2020-03-27 ENCOUNTER — Other Ambulatory Visit: Payer: Self-pay | Admitting: Family Medicine

## 2020-03-27 ENCOUNTER — Telehealth (HOSPITAL_COMMUNITY): Payer: Self-pay | Admitting: *Deleted

## 2020-03-27 ENCOUNTER — Other Ambulatory Visit: Payer: Self-pay

## 2020-03-27 ENCOUNTER — Telehealth: Payer: Self-pay | Admitting: Internal Medicine

## 2020-03-27 DIAGNOSIS — I5022 Chronic systolic (congestive) heart failure: Secondary | ICD-10-CM

## 2020-03-27 MED ORDER — PANTOPRAZOLE SODIUM 40 MG PO TBEC: 40.0000 mg | DELAYED_RELEASE_TABLET | Freq: Two times a day (BID) | ORAL | 0 refills | Status: DC

## 2020-03-27 NOTE — Telephone Encounter (Signed)
Pt called stating he thinks he is having an issue with his device he has a rapid heart rate, fluid retention, and low stamina. Pt called transferred to EP clinic per Dr.McLean .

## 2020-03-27 NOTE — Telephone Encounter (Signed)
Patient states his HR has been elevated, however, he does not have a log. No re  Pt c/o swelling: STAT is pt has developed SOB within 24 hours  1) How much weight have you gained and in what time span? About 4 lbs in 5 days  2) If swelling, where is the swelling located? Both feet  3) Are you currently taking a fluid pill? No  4) Are you currently SOB? No  5) Do you have a log of your daily weights (if so, list)?  172 , 173, 174, 175, 176, 174  6) Have you gained 3 pounds in a day or 5 pounds in a week? No  7) Have you traveled recently? No

## 2020-03-27 NOTE — Telephone Encounter (Signed)
Spoke with pt who is complaining of bilateral feet swelling with increased dyspnea on exertion x 1 week.  Requested pt send a PPM transmission and Optivol level shows pt is above threshold per Trena Platt, Device RN.  Pt states he is taking Torsemide, Spironolactone and other medications as prescribed.  Reviewed ED precautions. Pt advised will forward information to Dr Aundra Dubin for review and further recommendation as Dr Caryl Comes is out of town. Pt verbalizes understanding and agrees with current plan.

## 2020-03-27 NOTE — Telephone Encounter (Signed)
He should be taking torsemide 20 mg every other day.  Have him increase torsemide to 40 mg daily x 3 days then 20 mg daily after that.  BMET in 1 week.

## 2020-03-27 NOTE — Telephone Encounter (Signed)
CRT-Ptransmission received, optivol level above threshold

## 2020-03-28 MED ORDER — TORSEMIDE 20 MG PO TABS: 20.0000 mg | ORAL_TABLET | Freq: Every day | ORAL | 3 refills | Status: DC

## 2020-03-28 NOTE — Telephone Encounter (Signed)
Pt aware and verbalized understanding. Lab appt scheduled.  

## 2020-04-04 ENCOUNTER — Ambulatory Visit (HOSPITAL_COMMUNITY)
Admission: RE | Admit: 2020-04-04 | Discharge: 2020-04-04 | Disposition: A | Discharge status: Home / Self Care | Payer: Medicare Other | Source: Ambulatory Visit | Attending: Internal Medicine | Admitting: Internal Medicine

## 2020-04-04 ENCOUNTER — Other Ambulatory Visit: Payer: Self-pay

## 2020-04-04 DIAGNOSIS — I5022 Chronic systolic (congestive) heart failure: Secondary | ICD-10-CM | POA: Insufficient documentation

## 2020-04-04 LAB — BASIC METABOLIC PANEL
Anion gap: 7 (ref 5–15)
BUN: 52 mg/dL — ABNORMAL HIGH (ref 8–23)
CO2: 24 mmol/L (ref 22–32)
Calcium: 9.1 mg/dL (ref 8.9–10.3)
Chloride: 102 mmol/L (ref 98–111)
Creatinine, Ser: 1.79 mg/dL — ABNORMAL HIGH (ref 0.61–1.24)
GFR, Estimated: 38 mL/min — ABNORMAL LOW (ref >60)
Glucose, Bld: 119 mg/dL — ABNORMAL HIGH (ref 70–99)
Potassium: 4.5 mmol/L (ref 3.5–5.1)
Sodium: 133 mmol/L — ABNORMAL LOW (ref 135–145)

## 2020-04-16 ENCOUNTER — Other Ambulatory Visit: Payer: Self-pay | Admitting: Family Medicine

## 2020-04-17 DIAGNOSIS — Z950 Presence of cardiac pacemaker: Secondary | ICD-10-CM | POA: Insufficient documentation

## 2020-04-17 NOTE — Telephone Encounter (Signed)
Received  Refill request for:  Tramadol 50 mg LR 03/19/20, #90, 0 rfs LOV 03/15/20 FOV  None scheduled.     Please review and advise.   Thanks.   Dm/cma

## 2020-04-18 ENCOUNTER — Other Ambulatory Visit: Payer: Self-pay

## 2020-04-18 ENCOUNTER — Encounter: Payer: Medicare Other | Admitting: Internal Medicine

## 2020-04-18 DIAGNOSIS — I5022 Chronic systolic (congestive) heart failure: Secondary | ICD-10-CM

## 2020-04-18 DIAGNOSIS — Z950 Presence of cardiac pacemaker: Secondary | ICD-10-CM

## 2020-04-18 DIAGNOSIS — I4819 Other persistent atrial fibrillation: Secondary | ICD-10-CM

## 2020-04-24 ENCOUNTER — Ambulatory Visit (INDEPENDENT_AMBULATORY_CARE_PROVIDER_SITE_OTHER): Payer: Medicare Other

## 2020-04-24 DIAGNOSIS — I420 Dilated cardiomyopathy: Secondary | ICD-10-CM | POA: Diagnosis not present

## 2020-04-24 LAB — CUP PACEART REMOTE DEVICE CHECK
Battery Remaining Longevity: 83 mo
Battery Voltage: 2.99 V
Brady Statistic AP VP Percent: 0 %
Brady Statistic AP VS Percent: 0 %
Brady Statistic AS VP Percent: 95 %
Brady Statistic AS VS Percent: 5 %
Brady Statistic RA Percent Paced: 0 %
Brady Statistic RV Percent Paced: 95 %
Date Time Interrogation Session: 20211130050836
Implantable Lead Implant Date: 20200831
Implantable Lead Implant Date: 20200831
Implantable Lead Location: 753858
Implantable Lead Location: 753860
Implantable Lead Model: 5076
Implantable Pulse Generator Implant Date: 20200831
Lead Channel Impedance Value: 1026 Ohm
Lead Channel Impedance Value: 1045 Ohm
Lead Channel Impedance Value: 1083 Ohm
Lead Channel Impedance Value: 1140 Ohm
Lead Channel Impedance Value: 1235 Ohm
Lead Channel Impedance Value: 1254 Ohm
Lead Channel Impedance Value: 3420 Ohm
Lead Channel Impedance Value: 3420 Ohm
Lead Channel Impedance Value: 380 Ohm
Lead Channel Impedance Value: 532 Ohm
Lead Channel Impedance Value: 532 Ohm
Lead Channel Impedance Value: 608 Ohm
Lead Channel Impedance Value: 608 Ohm
Lead Channel Impedance Value: 760 Ohm
Lead Channel Pacing Threshold Amplitude: 0.75 V
Lead Channel Pacing Threshold Amplitude: 2.5 V
Lead Channel Pacing Threshold Pulse Width: 0.4 ms
Lead Channel Pacing Threshold Pulse Width: 0.7 ms
Lead Channel Sensing Intrinsic Amplitude: 15.125 mV
Lead Channel Sensing Intrinsic Amplitude: 15.125 mV
Lead Channel Setting Pacing Amplitude: 2.5 V
Lead Channel Setting Pacing Amplitude: 2.75 V
Lead Channel Setting Pacing Pulse Width: 0.4 ms
Lead Channel Setting Pacing Pulse Width: 0.7 ms
Lead Channel Setting Sensing Sensitivity: 2.8 mV

## 2020-04-27 ENCOUNTER — Encounter: Payer: Self-pay | Admitting: Internal Medicine

## 2020-04-27 ENCOUNTER — Ambulatory Visit (INDEPENDENT_AMBULATORY_CARE_PROVIDER_SITE_OTHER): Payer: Medicare Other | Admitting: Internal Medicine

## 2020-04-27 ENCOUNTER — Other Ambulatory Visit: Payer: Self-pay

## 2020-04-27 VITALS — BP 92/70 | HR 83 | Ht 68.0 in | Wt 175.6 lb

## 2020-04-27 DIAGNOSIS — I6523 Occlusion and stenosis of bilateral carotid arteries: Secondary | ICD-10-CM | POA: Diagnosis not present

## 2020-04-27 DIAGNOSIS — I447 Left bundle-branch block, unspecified: Secondary | ICD-10-CM | POA: Diagnosis not present

## 2020-04-27 DIAGNOSIS — I4819 Other persistent atrial fibrillation: Secondary | ICD-10-CM | POA: Diagnosis not present

## 2020-04-27 NOTE — Patient Instructions (Signed)

## 2020-04-27 NOTE — Progress Notes (Signed)
Patient Care Team: [name], MD as PCP - General (Family Medicine) Larey Dresser, MD as PCP - Advanced Heart Failure (Cardiology) Larey Dresser, MD as Consulting Physician (Cardiology) Milus Banister, MD as Attending Physician (Gastroenterology) Jarome Matin, MD as Consulting Physician (Dermatology) Rutherford Guys, MD as Consulting Physician (Ophthalmology) Paralee Cancel, MD as Consulting Physician (Orthopedic Surgery) Joycie Peek, MD (General Practice)   HPI  Travis [name] is a 79 y.o. male Seen in followup for persistent atrial fibrillation and tachycardia-induced and resolved cardiomyopathy; LBBB  Because of recurrent events underwent CRT-P and AV ablation Staged fall 2020  He also has a probable bicuspid aortic valve with mild stenosis;   Continues with good energy.  Denies significant dyspnea or chest pain.  Some peripheral edema but only at the end of the day.  Diet is salt deplete and fluid deplete   DATE TEST EF   4/18 Echo  55-60% AS mild  3/19 Echo   30.35 %   7/20 Echo   20.25 % AoV bicuspid  No AS  Mod AI   2/21 Echo  35-40% As  MOD   Date Cr K Hgb  8/20 1.29 4.3 14.2               Past Medical History:  Diagnosis Date  . Arthritis   . Biatrial enlargement    severe  . Bicuspid aortic valve   . CHF (congestive heart failure) ([place])    JULY 2014  . Chronic renal insufficiency    not aware  . GERD (gastroesophageal reflux disease)   . History of cardioversion 12/16/2012  . History of stomach ulcers   . Hypertension   . Left bundle branch block   . Nonischemic cardiomyopathy ([place])       . Persistent atrial fibrillation ([place])    multiple prior cardioversion  . Sinus bradycardia   . Status post clamping of cerebral aneurysm 79    Past Surgical History:  Procedure Laterality Date  . ATRIAL FIBRILLATION ABLATION N/A 10/06/2017   Procedure: ATRIAL FIBRILLATION ABLATION;  Surgeon: [name], MD;  Location: [place] [facility];  Service: Cardiovascular;  Laterality: N/A;  . AV NODE ABLATION N/A 02/18/2019   Procedure: AV NODE ABLATION;  Surgeon: Deboraha Sprang, MD;  Location: [place] [facility];  Service: Cardiovascular;  Laterality: N/A;  . BIOPSY  02/06/2018   Procedure: BIOPSY;  Surgeon: Lavena Bullion, DO;  Location: [place] ENDOSCOPY;  Service: Gastroenterology;;  . BIOPSY  02/28/2018   Procedure: BIOPSY;  Surgeon: Mauri Pole, MD;  Location: [place] ENDOSCOPY;  Service: Endoscopy;;  . BIV PACEMAKER INSERTION CRT-P N/A 01/24/2019   Procedure: BIV PACEMAKER INSERTION CRT-P;  Surgeon: Deboraha Sprang, MD;  Location: [place] [facility];  Service: Cardiovascular;  Laterality: N/A;  . CARDIAC CATHETERIZATION     not awaer of this  . CARDIOVERSION N/A 12/16/2012   Procedure: CARDIOVERSION;  Surgeon: Lelon Perla, MD;  Location: [facility];  Service: Cardiovascular;  Laterality: N/A;  . CARDIOVERSION N/A 08/14/2017   Procedure: CARDIOVERSION;  Surgeon: Josue Hector, MD;  Location: [facility] ENDOSCOPY;  Service: Cardiovascular;  Laterality: N/A;  . CARDIOVERSION N/A 09/21/2017   Procedure: CARDIOVERSION;  Surgeon: Sanda Caria Transue, MD;  Location: [place] ENDOSCOPY;  Service: Cardiovascular;  Laterality: N/A;  . CARDIOVERSION N/A 10/13/2017   Procedure: CARDIOVERSION;  Surgeon: Josue Hector, MD;  Location: [facility] ENDOSCOPY;  Service: Cardiovascular;  Laterality: N/A;  . CARDIOVERSION N/A 07/05/2018  Procedure: CARDIOVERSION;  Surgeon: Larey Dresser, MD;  Location: [facility] ENDOSCOPY;  Service: Cardiovascular;  Laterality: N/A;  . CATARACT EXTRACTION  05/2015   bilateral  . cerebral anuersym post clips    . ESOPHAGOGASTRODUODENOSCOPY N/A 02/06/2018   Procedure: ESOPHAGOGASTRODUODENOSCOPY (EGD);  Surgeon: Lavena Bullion, DO;  Location: [facility] ENDOSCOPY;  Service: Gastroenterology;  Laterality: N/A;  . ESOPHAGOGASTRODUODENOSCOPY N/A 02/28/2018   Procedure: ESOPHAGOGASTRODUODENOSCOPY (EGD);  Surgeon: Mauri Pole, MD;   Location: [facility] ENDOSCOPY;  Service: Endoscopy;  Laterality: N/A;  . fractured left arm    . HERNIA REPAIR     lft  . INGUINAL HERNIA REPAIR  07/02/2011   Procedure: LAPAROSCOPIC INGUINAL HERNIA;  Surgeon: Harl Bowie, MD;  Location: [place];  Service: General;  Laterality: Left;  Laparoscopic left inguinal hernia repair and mesh  . INGUINAL HERNIA REPAIR Left 11/24/2018   Procedure: OPEN LEFT INGUINAL HERNIA REPAIR WITH MESH;  Surgeon: Coralie Keens, MD;  Location: [place];  Service: General;  Laterality: Left;  TAP BLOCK  . left tendon repair     lft foot  . ROTATOR CUFF REPAIR     lf  . TEE WITHOUT CARDIOVERSION N/A 08/14/2017   Procedure: TRANSESOPHAGEAL ECHOCARDIOGRAM (TEE);  Surgeon: Josue Hector, MD;  Location: [facility] ENDOSCOPY;  Service: Cardiovascular;  Laterality: N/A;  . TEE WITHOUT CARDIOVERSION N/A 07/05/2018   Procedure: TRANSESOPHAGEAL ECHOCARDIOGRAM (TEE);  Surgeon: Larey Dresser, MD;  Location: [facility] ENDOSCOPY;  Service: Cardiovascular;  Laterality: N/A;  . TONSILLECTOMY    . TOTAL KNEE ARTHROPLASTY Bilateral 02/06/2014   Procedure: TOTAL KNEE BILATERAL;  Surgeon: Mauri Pole, MD;  Location: [facility] ORS;  Service: Orthopedics;  Laterality: Bilateral;  . WRIST GANGLION EXCISION     lft    Current Outpatient Medications  Medication Sig Dispense Refill  . acetaminophen (TYLENOL) 500 MG tablet Take 1,000 mg by mouth every 8 (eight) hours as needed for moderate pain or headache.    . carvedilol (COREG) 25 MG tablet TAKE 1 TABLET(25 MG) BY MOUTH TWICE DAILY 180 tablet 2  . Coenzyme Q10 200 MG capsule Take 1 capsule (200 mg total) by mouth daily. 90 capsule 3  . digoxin (LANOXIN) 0.125 MG tablet Take 0.5 tablets (0.0625 mg total) by mouth daily. 45 tablet 0  . finasteride (PROPECIA) 1 MG tablet TAKE 1 TABLET BY MOUTH EVERY DAY 90 tablet 3  . fluticasone (FLONASE) 50 MCG/ACT nasal spray Place 2 sprays into both nostrils daily. 16 g 3  . Multiple Vitamins-Minerals (PRESERVISION AREDS  2+MULTI VIT PO) Take 1 tablet by mouth in the morning and at bedtime.    . mupirocin ointment (BACTROBAN) 2 % APPLY EXTERNALLY TO FACE EVERY DAY AS NEEDED    . pantoprazole (PROTONIX) 40 MG tablet Take 1 tablet (40 mg total) by mouth 2 (two) times daily. 180 tablet 0  . patiromer (VELTASSA) 8.4 g packet Take 1 packet (8.4 g total) by mouth daily. 30 each 5  . Rivaroxaban (XARELTO) 15 MG TABS tablet TAKE 1 TABLET BY MOUTH DAILY WITH SUPPER 90 tablet 3  . sacubitril-valsartan (ENTRESTO) 97-103 MG Take 1 tablet by mouth 2 (two) times daily. 60 tablet 6  . spironolactone (ALDACTONE) 25 MG tablet Take 1 tablet (25 mg total) by mouth daily. 90 tablet 3  . temazepam (RESTORIL) 15 MG capsule Take 1 capsule (15 mg total) by mouth at bedtime as needed for sleep. 30 capsule 5  . torsemide (DEMADEX) 20 MG tablet Take 1 tablet (20 mg total)  by mouth daily. 90 tablet 3  . traMADol (ULTRAM) 50 MG tablet TAKE 1 TAB BY MOUTH 3 TIMES DAILY AS NEEDED FOR MODERATE OR SEVERE ARTHRITIS PAIN IN HANDS AND NECK 90 tablet 0   No current facility-administered medications for this visit.    Allergies  Allergen Reactions  . Atorvastatin Other (See Comments)    Severe fatigue on 10mg  and 40mg  dosing  . Nexlizet [Bempedoic Acid-Ezetimibe]     myalgias  . Repatha [Evolocumab] Other (See Comments)    Leg weakness  . Rosuvastatin Other (See Comments)    myalgias on 5mg  daily    Review of Systems negative except from HPI and PMH  Physical Exam BP 92/70   Pulse 83   Ht 5\' 8"  (1.727 m)   Wt 175 lb 9.6 oz (79.7 kg)   SpO2 95%   BMI 26.70 kg/m  Well developed and well nourished in no acute distress HENT normal Neck supple with JVP-flat Clear Device pocket well healed; without hematoma or erythema.  There is no tethering  Regular rate and rhythm, no  * murmur Abd-soft with active BS No Clubbing cyanosis    edema Skin-warm and dry A & Oriented  Grossly normal sensory and motor function  ECG     Assessment  and  Plan  Atrial fibrillation- permanent   Cardiomyopathy-tachycardia mediated-recurrent   Bicuspid aortic valve-    Hypertension   Renal insufficiency  Left bundle branch block  Complete Heart Block s/p AV ablation  Pacemaker CRT-P  [employer]    Blood pressure low but without symptoms.  Functional status remains quite good following CRT and AV junction ablation.  Minimal volume overload.  Continue current strategy  On anticoagulation without bleeding

## 2020-05-09 ENCOUNTER — Telehealth: Payer: Self-pay | Admitting: Family Medicine

## 2020-05-09 NOTE — Chronic Care Management (AMB) (Signed)
°  Chronic Care Management   Note  05/09/2020 Name: Travis [name] MRN: [id number] DOB: 01/31/1941  Travis [name] is a 79 y.o. year old male who is a primary care patient of Yong Channel, Brayton Mars, MD. I reached out to Travis [name] by phone today in response to a referral sent by [name]'s PCP, [name], MD.   [name] was given information about Chronic Care Management services today including:  1. CCM service includes personalized support from designated clinical staff supervised by his physician, including individualized plan of care and coordination with other care providers 2. 24/7 contact phone numbers for assistance for urgent and routine care needs. 3. Service will only be billed when office clinical staff spend 20 minutes or more in a month to coordinate care. 4. Only one practitioner may furnish and bill the service in a calendar month. 5. The patient may stop CCM services at any time (effective at the end of the month) by phone call to the office staff.   Patient agreed to services and verbal consent obtained.   Follow up plan:   Lauretta Grill Upstream Scheduler

## 2020-05-09 NOTE — Progress Notes (Signed)
  Chronic Care Management   Outreach Note  05/09/2020 Name: Travis [name] MRN: [id number] DOB: 09/02/1940  Referred by: [name], MD Reason for referral : No chief complaint on file.   An unsuccessful telephone outreach was attempted today. The patient was referred to the pharmacist for assistance with care management and care coordination.   Follow Up Plan:   Lauretta Grill Upstream Scheduler

## 2020-05-15 ENCOUNTER — Other Ambulatory Visit: Payer: Self-pay | Admitting: Family Medicine

## 2020-05-16 ENCOUNTER — Other Ambulatory Visit: Payer: Self-pay | Admitting: Family Medicine

## 2020-05-17 ENCOUNTER — Telehealth: Payer: Self-pay

## 2020-05-17 NOTE — Telephone Encounter (Signed)
Pt states the pharmacy didn't receive it

## 2020-05-17 NOTE — Telephone Encounter (Signed)
Refill was sent 05/15/20.

## 2020-05-17 NOTE — Telephone Encounter (Signed)
..   LAST APPOINTMENT DATE: 03/15/2020   NEXT APPOINTMENT DATE:@1 /03/2021  MEDICATION:traMADol (ULTRAM) 50 MG tablet    PHARMACY:  **Let patient know to contact pharmacy at the end of the day to make sure medication is ready. **  ** Please notify patient to allow 48-72 hours to process**  **Encourage patient to contact the pharmacy for refills or they can request refills through [facility]**  CLINICAL FILLS OUT ALL BELOW:   LAST REFILL:  QTY:  REFILL DATE:    OTHER COMMENTS:    Okay for refill?  Please advise

## 2020-05-28 NOTE — Telephone Encounter (Signed)
Rx resent 12/23.

## 2020-05-29 ENCOUNTER — Other Ambulatory Visit: Payer: Self-pay | Admitting: Family Medicine

## 2020-05-30 NOTE — Telephone Encounter (Signed)
.   LAST APPOINTMENT DATE: 05/17/2020   NEXT APPOINTMENT DATE:@1 /03/2021  MEDICATION:temazepam (RESTORIL) 15 MG capsule  PHARMACY:[id number] - [place], [place] - [address]  CLINICAL FILLS OUT ALL BELOW:   LAST REFILL:  QTY:  REFILL DATE:    OTHER COMMENTS:    Okay for refill?  Please advise

## 2020-06-04 ENCOUNTER — Telehealth: Payer: Self-pay

## 2020-06-04 NOTE — Chronic Care Management (AMB) (Signed)
Chronic Care Management Pharmacy Assistant   Name: Travis [name]  MRN: [id number] DOB: 1941-01-26  Reason for Encounter: Medication Review / Initial Visit    Travis [name],  80 y.o. , male presents for their Initial CCM visit with the clinical pharmacist In office.  PCP : [name], MD  Allergies:   Allergies  Allergen Reactions  . Atorvastatin Other (See Comments)    Severe fatigue on 10mg  and 40mg  dosing  . Nexlizet [Bempedoic Acid-Ezetimibe]     myalgias  . Repatha [Evolocumab] Other (See Comments)    Leg weakness  . Rosuvastatin Other (See Comments)    myalgias on 5mg  daily    Medications: Outpatient Encounter Medications as of 06/04/2020  Medication Sig  . temazepam (RESTORIL) 15 MG capsule TAKE 1 CAPSULE(15 MG) BY MOUTH AT BEDTIME AS NEEDED FOR SLEEP  . traMADol (ULTRAM) 50 MG tablet TAKE 1 TABLET BY MOUTH THREE TIMES DAILY AS NEEDED FOR MODERATE OR SEVERE ARTHRITIS PAIN IN HANDS AND NECK  . acetaminophen (TYLENOL) 500 MG tablet Take 1,000 mg by mouth every 8 (eight) hours as needed for moderate pain or headache.  . carvedilol (COREG) 25 MG tablet TAKE 1 TABLET(25 MG) BY MOUTH TWICE DAILY  . Coenzyme Q10 200 MG capsule Take 1 capsule (200 mg total) by mouth daily.  . digoxin (LANOXIN) 0.125 MG tablet Take 0.5 tablets (0.0625 mg total) by mouth daily.  . finasteride (PROPECIA) 1 MG tablet TAKE 1 TABLET BY MOUTH EVERY DAY  . fluticasone (FLONASE) 50 MCG/ACT nasal spray Place 2 sprays into both nostrils daily.  . Multiple Vitamins-Minerals (PRESERVISION AREDS 2+MULTI VIT PO) Take 1 tablet by mouth in the morning and at bedtime.  . mupirocin ointment (BACTROBAN) 2 % APPLY EXTERNALLY TO FACE EVERY DAY AS NEEDED  . pantoprazole (PROTONIX) 40 MG tablet Take 1 tablet (40 mg total) by mouth 2 (two) times daily.  . patiromer (VELTASSA) 8.4 g packet Take 1 packet (8.4 g total) by mouth daily.  . Rivaroxaban (XARELTO) 15 MG TABS tablet TAKE 1 TABLET BY MOUTH DAILY WITH  SUPPER  . sacubitril-valsartan (ENTRESTO) 97-103 MG Take 1 tablet by mouth 2 (two) times daily.  Marland Kitchen spironolactone (ALDACTONE) 25 MG tablet Take 1 tablet (25 mg total) by mouth daily.  Marland Kitchen torsemide (DEMADEX) 20 MG tablet Take 1 tablet (20 mg total) by mouth daily.   No facility-administered encounter medications on file as of 06/04/2020.    Current Diagnosis: Patient Active Problem List   Diagnosis Date Noted  . Presence of biventricular cardiac pacemaker - MDT 04/17/2020  . Statin myopathy 12/06/2019  . Acute blood loss anemia 03/01/2018  . Pyloric stenosis, acquired 02/28/2018  . Duodenal ulcer, with obstruction 02/28/2018  . GI bleed 02/06/2018  . Chronic systolic heart failure ([place]) 10/10/2017  . Persistent atrial fibrillation 10/06/2017  . Symptomatic anemia 08/01/2015  . Hyperlipidemia 12/19/2014  . Male pattern baldness 03/06/2014  . Anxiety state 03/06/2014  . Insomnia 03/06/2014  . Overweight (BMI 25.0-29.9) 02/08/2014  . S/P TKR (total knee replacement) 02/08/2014  . Sinus bradycardia 01/04/2013  . Pulmonary hypertension ([place]) 03/09/2012  . Hyponatremia 02/21/2012  . Bicuspid aortic valve   . Hypertension   . Cardiomyopathy, rate related with resolution now recurrent 12/08/2011  . Left bundle branch block 12/10/2010  . DRY MOUTH 11/07/2009  . Carotid bruit 07/05/2009  . GERD 01/07/2008  . Osteoarthritis 02/04/2007  . GANGLION CYST, WRIST, LEFT 02/04/2007    Have you seen any other  providers since your last visit?   Patient states he saw his cardiologist recently since his last office visit with his PCP (Dr. Yong Channel).  Any changes in your medications or health?   Patient states he has not had any changes to his medications or health at this time.  Any side effects from any medications?   Patient states he has not been experiencing any new side effects. However patient reports chronic constipation from taking Tramadol. In which the patient states this side effect is  expected.  Do you have an symptoms or problems not managed by your medications?   Patient states he does not currently have any symptoms or problems not managed by his medications at this time.  Any concerns about your health right now?   Patient denies any concerns at this time stating he feels he is "doing good."  Has your provider asked that you check blood pressure, blood sugar, or follow special diet at home?   Patient states his blood pressure is usually kind of low so he does not check it regularly. Patient reports systolic numbers ranging from 100-120. Patient states he is unsure of any diastolic numbers, ensures they are normal. Patient states he does not follow a special diet at this time. Patient states he tries to weigh himself daily as he tends to build up fluid easily.  Do you get any type of exercise on a regular basis?   Patient states he does exercise about 2-3 times a week for at least 30-45 minutes a day.   Can you think of a goal you would like to reach for your health?   Patient states he would like to be stronger and have more energy.  Do you have any problems getting your medications?   Patient states it is frustrating when he has to change back and forth from [facility] to [facility] depending on what medication and which pharmacy is preferred. Patient states this seems to happen more often than he would like.  Is there anything that you would like to discuss during the appointment?   Patient states there is nothing he can think of at this time. Please bring medications and supplements to appointment  Georgiana Shore ,[facility] Clinical Pharmacist Assistant [phone]   Follow-Up:  Pharmacist Review

## 2020-06-05 ENCOUNTER — Other Ambulatory Visit: Payer: Self-pay

## 2020-06-05 ENCOUNTER — Ambulatory Visit: Payer: Medicare Other

## 2020-06-05 DIAGNOSIS — I4819 Other persistent atrial fibrillation: Secondary | ICD-10-CM

## 2020-06-05 DIAGNOSIS — I10 Essential (primary) hypertension: Secondary | ICD-10-CM

## 2020-06-05 DIAGNOSIS — E785 Hyperlipidemia, unspecified: Secondary | ICD-10-CM

## 2020-06-05 NOTE — Progress Notes (Signed)
Chronic Care Management Pharmacy Name: Travis [name]     MRN: [id number]     DOB: [birth year]  Chief Complaint/ HPI Travis [name], 80 y.o., male, presents for their initial CCM visit with the clinical pharmacist in office.  PCP: [name], MD Encounter Diagnoses  Name Primary?  . Primary hypertension Yes  . Hyperlipidemia, unspecified hyperlipidemia type   . Persistent atrial fibrillation     Office Visits:  03/15/2020 (PCP): runny nose - flonase trial. Start daily B12 1000 mcg ->QW  Consult Visit: 03/14/2020 (Dr Aundra Dubin): chronic systolic HF. New Zetia Rx. 02/06/2020 (Megan Supple CPP): Lipid panel checked last week and LDL had increased to 118. Pt was started on Nexlizet 2 months ago due to previous intolerances to rosuvastatin 61m daily, atorvastatin 123mand 404maily, and Repatha (myalgias with all).  Discussed trying lower dose of Praluent, pravastatin, ezetimibe, or Nexletol. Pt states he wishes to rechallenge with his Nexlizet first. He will call clinic if his aches return and we can consider one of the above options at that time.  Patient Active Problem List   Diagnosis Date Noted  . Presence of biventricular cardiac pacemaker - MDT 04/17/2020  . Statin myopathy 12/06/2019  . Acute blood loss anemia 03/01/2018  . Pyloric stenosis, acquired 02/28/2018  . Duodenal ulcer, with obstruction 02/28/2018  . GI bleed 02/06/2018  . Chronic systolic heart failure (HCCMilford5/18/2019  . Persistent atrial fibrillation 10/06/2017  . Symptomatic anemia 08/01/2015  . Hyperlipidemia 12/19/2014  . Male pattern baldness 03/06/2014  . Anxiety state 03/06/2014  . Insomnia 03/06/2014  . Overweight (BMI 25.0-29.9) 02/08/2014  . S/P TKR (total knee replacement) 02/08/2014  . Sinus bradycardia 01/04/2013  . Pulmonary hypertension (HCCDelavan0/15/2013  . Hyponatremia 02/21/2012  . Bicuspid aortic valve   . Hypertension   . Cardiomyopathy, rate related with resolution now recurrent  12/08/2011  . Left bundle branch block 12/10/2010  . DRY MOUTH 11/07/2009  . Carotid bruit 07/05/2009  . GERD 01/07/2008  . Osteoarthritis 02/04/2007  . GANGLION CYST, WRIST, LEFT 02/04/2007   Past Surgical History:  Procedure Laterality Date  . ATRIAL FIBRILLATION ABLATION N/A 10/06/2017   Procedure: ATRIAL FIBRILLATION ABLATION;  Surgeon: AllThompson GrayerD;  [facility] [place] [facility];  Service: Cardiovascular;  Laterality: N/A;  . AV NODE ABLATION N/A 02/18/2019   Procedure: AV NODE ABLATION;  Surgeon: KleDeboraha SprangD;  Location: [facility];  Service: Cardiovascular;  Laterality: N/A;  . BIOPSY  02/06/2018   Procedure: BIOPSY;  Surgeon: CirLavena BullionO;  [facility];  Service: Gastroenterology;;  . BIOPSY  02/28/2018   Procedure: BIOPSY;  Surgeon: NanMauri PoleD;  [facility];  Service: Endoscopy;;  . BIV PACEMAKER INSERTION CRT-P N/A 01/24/2019   Procedure: BIV PACEMAKER INSERTION CRT-P;  Surgeon: KleDeboraha SprangD;  Location: [facility];  Service: Cardiovascular;  Laterality: N/A;  . CARDIAC CATHETERIZATION     not awaer of this  . CARDIOVERSION N/A 12/16/2012   Procedure: CARDIOVERSION;  Surgeon: BriLelon PerlaD;  [facility];  Service: Cardiovascular;  Laterality: N/A;  . CARDIOVERSION N/A 08/14/2017   Procedure: CARDIOVERSION;  Surgeon: NisJosue HectorD;  [facility];  Service: Cardiovascular;  Laterality: N/A;  . CARDIOVERSION N/A 09/21/2017   Procedure: CARDIOVERSION;  Surgeon: CroSanda KleinD;  [facility];  Service: Cardiovascular;  Laterality: N/A;  . CARDIOVERSION N/A 10/13/2017   Procedure: CARDIOVERSION;  Surgeon: NisJenkins Rouge  C, MD;  Location: [place];  Service: Cardiovascular;  Laterality: N/A;  . CARDIOVERSION N/A 07/05/2018   Procedure: CARDIOVERSION;  Surgeon: Larey Dresser, MD;  Location: [facility] ENDOSCOPY;  Service: Cardiovascular;  Laterality: N/A;  . CATARACT  EXTRACTION  05/2015   bilateral  . cerebral anuersym post clips    . ESOPHAGOGASTRODUODENOSCOPY N/A 02/06/2018   Procedure: ESOPHAGOGASTRODUODENOSCOPY (EGD);  Surgeon: Lavena Bullion, DO;  [facility] ENDOSCOPY;  Service: Gastroenterology;  Laterality: N/A;  . ESOPHAGOGASTRODUODENOSCOPY N/A 02/28/2018   Procedure: ESOPHAGOGASTRODUODENOSCOPY (EGD);  Surgeon: Mauri Pole, MD;  Location: [facility] ENDOSCOPY;  Service: Endoscopy;  Laterality: N/A;  . fractured left arm    . HERNIA REPAIR     lft  . INGUINAL HERNIA REPAIR  07/02/2011   Procedure: LAPAROSCOPIC INGUINAL HERNIA;  Surgeon: Harl Bowie, MD;  Location: [place];  Service: General;  Laterality: Left;  Laparoscopic left inguinal hernia repair and mesh  . INGUINAL HERNIA REPAIR Left 11/24/2018   Procedure: OPEN LEFT INGUINAL HERNIA REPAIR WITH MESH;  Surgeon: Coralie Keens, MD;  Location: [place];  Service: General;  Laterality: Left;  TAP BLOCK  . left tendon repair     lft foot  . ROTATOR CUFF REPAIR     lf  . TEE WITHOUT CARDIOVERSION N/A 08/14/2017   Procedure: TRANSESOPHAGEAL ECHOCARDIOGRAM (TEE);  Surgeon: Josue Hector, MD;  Location: [facility] ENDOSCOPY;  Service: Cardiovascular;  Laterality: N/A;  . TEE WITHOUT CARDIOVERSION N/A 07/05/2018   Procedure: TRANSESOPHAGEAL ECHOCARDIOGRAM (TEE);  Surgeon: Larey Dresser, MD;  Location: [facility] ENDOSCOPY;  Service: Cardiovascular;  Laterality: N/A;  . TONSILLECTOMY    . TOTAL KNEE ARTHROPLASTY Bilateral 02/06/2014   Procedure: TOTAL KNEE BILATERAL;  Surgeon: Mauri Pole, MD;  Location: [facility];  Service: Orthopedics;  Laterality: Bilateral;  . WRIST GANGLION EXCISION     lft   Family History  Problem Relation Age of Onset  . Stroke Father 49       smoker  . Lung cancer Mother 35       former smoker  . Stroke Mother   . Stroke Brother   . Colon cancer Neg Hx    Social History   Social History Narrative   Homosexual. Lives alone. Not sexually active.       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 Atorvastatin Other (See Comments)    Severe fatigue on 61m and 439mdosing  . Nexlizet [Bempedoic Acid-Ezetimibe]     myalgias  . Repatha [Evolocumab] Other (See Comments)    Leg weakness  . Rosuvastatin Other (See Comments)    myalgias on 14m51maily   Outpatient Encounter Medications as of 06/05/2020  Medication Sig  . temazepam (RESTORIL) 15 MG capsule TAKE 1 CAPSULE(15 MG) BY MOUTH AT BEDTIME AS NEEDED FOR SLEEP  . traMADol (ULTRAM) 50 MG tablet TAKE 1 TABLET BY MOUTH THREE TIMES DAILY AS NEEDED FOR MODERATE OR SEVERE ARTHRITIS PAIN IN HANDS AND NECK  . acetaminophen (TYLENOL) 500 MG tablet Take 1,000 mg by mouth every 8 (eight) hours as needed for moderate pain or headache.  . carvedilol (COREG) 25 MG tablet TAKE 1 TABLET(25 MG) BY MOUTH TWICE DAILY  . Coenzyme Q10 200 MG capsule Take 1 capsule (200 mg total) by mouth daily.  . digoxin (LANOXIN) 0.125 MG tablet Take 0.5 tablets (0.0625 mg total)  by mouth daily.  . finasteride (PROPECIA) 1 MG tablet TAKE 1 TABLET BY MOUTH EVERY DAY  . fluticasone (FLONASE) 50 MCG/ACT nasal spray Place 2 sprays into both nostrils daily.  . Multiple Vitamins-Minerals (PRESERVISION AREDS 2+MULTI VIT PO) Take 1 tablet by mouth in the morning and at bedtime.  . mupirocin ointment (BACTROBAN) 2 % APPLY EXTERNALLY TO FACE EVERY DAY AS NEEDED  . pantoprazole (PROTONIX) 40 MG tablet Take 1 tablet (40 mg total) by mouth 2 (two) times daily.  . patiromer (VELTASSA) 8.4 g packet Take 1 packet (8.4 g total) by mouth daily.  . Rivaroxaban (XARELTO) 15 MG TABS tablet TAKE 1 TABLET BY MOUTH DAILY WITH SUPPER  . sacubitril-valsartan (ENTRESTO) 97-103 MG Take 1 tablet by mouth 2 (two) times daily.  Marland Kitchen spironolactone (ALDACTONE) 25 MG tablet Take 1 tablet (25 mg total) by mouth daily.  Marland Kitchen torsemide (DEMADEX) 20 MG tablet  Take 1 tablet (20 mg total) by mouth daily.   No facility-administered encounter medications on file as of 06/05/2020.   Patient Care Team    Relationship Specialty Notifications Start End  [name], MD PCP - General Family Medicine  04/18/14    Comment: Frederich Cha, Elby Showers, MD PCP - Advanced Heart Failure Cardiology Admissions 02/15/18   Larey Dresser, MD Consulting Physician Cardiology  04/08/18   Milus Banister, MD Attending Physician Gastroenterology  04/08/18   Jarome Matin, MD Consulting Physician Dermatology  04/08/18   Rutherford Guys, MD Consulting Physician Ophthalmology  04/08/18   Paralee Cancel, MD Consulting Physician Orthopedic Surgery  04/08/18   Joycie Peek, MD  General Practice  04/08/18   Madelin Rear, [facility]  Pharmacist  05/09/20   Madelin Rear, [facility] Pharmacist Pharmacist  05/09/20    Current Diagnosis/Assessment: Goals Addressed            This Visit's Progress   . PharmD Care Plan       CARE PLAN ENTRY (see longitudinal plan of care for additional care plan information)  Current Barriers:  . Chronic Disease Management support, education, and care coordination needs related to Hypertension, Hyperlipidemia, and Atrial Fibrillation   Hypertension BP Readings from Last 3 Encounters:  04/27/20 92/70  03/15/20 122/66  03/14/20 102/60   . Pharmacist Clinical Goal(s): o Over the next 365 days, patient will work with PharmD and providers to maintain BP goal <130/80 . Current regimen:  . Entresto 97-103 mg twice daily (heart failure, blood pressure) . Spironolactone 25 mg once daily (heart failure, blood pressure) . Carvedilol 25 mg twice daily (heart failure, blood pressure, heart rate control) . Interventions: o Reviewed patient assistance programs for Entresto - declined at this time. o Discussed exercise/diet - Maintain a healthy weight and exercise regularly, as directed by your health care provider. Eat healthy foods, such as: Lean  proteins, complex carbohydrates, fresh fruits and vegetables, low-fat dairy products, healthy fats. . Patient self care activities - Over the next 365 days, patient will: o Check BP several times per month at home, document, and provide at future appointments o Ensure daily salt intake < 2300 mg/day  Hyperlipidemia Lab Results  Component Value Date/Time   LDLCALC 118 (H) 02/02/2020 12:02 PM   LDLDIRECT 84 12/06/2019 10:58 AM   LDLDIRECT 74.0 07/31/2016 10:12 AM   . Pharmacist Clinical Goal(s): o Over the next 180 days, patient will work with PharmD and providers to achieve LDL goal < 70 . Current regimen:  o No current medications . Interventions: o  We discussed remaining alternatives for cholesterol medications - no previous trial Praluent injection, open to trying next . Patient self care activities - Over the next 180 days, patient will: o At cardiology follow-up: Discuss Praluent as next option to help lower LDL to goal <70  Stroke Prevention in Atrial Fibrillation . Pharmacist Clinical Goal(s) o Over the next 180 days, patient will work with PharmD and providers to ensure medication safety and accessibility . Current regimen:  o Xarelto 15 mg once daily with supper . Interventions: o Reviewed side effects - no problems noted o Reviewed patient assistance for Xarelto - declined at this time.  . Patient self care activities - Over the next 180 days, patient will: o Continue current management  Medication management . Pharmacist Clinical Goal(s): o Over the next 365 days, patient will work with PharmD and providers to maintain optimal medication adherence . Current pharmacy: [facility] . Interventions o Comprehensive medication review performed. o Will perform cost review comparing [place] to [place] - will follow-up if [facility] has lower overall cost  . Patient self care activities - Over the next 365 days, patient will: o Focus on  medication adherence by taking medications as prescribed o Report any questions or concerns to PharmD and/or provider(s) Initial goal documentation.      Hypertension   BP goal <130/80  BP Readings from Last 3 Encounters:  04/27/20 92/70  03/15/20 122/66  03/14/20 102/60    BMP Latest Ref Rng & Units 04/04/2020 03/14/2020 02/02/2020  Glucose 70 - 99 mg/dL 119(H) 121(H) 114(H)  BUN 8 - 23 mg/dL 52(H) 33(H) 31(H)  Creatinine 0.61 - 1.24 mg/dL 1.79(H) 1.48(H) 1.49(H)  BUN/Creat Ratio 10 - 24 - - 21  Sodium 135 - 145 mmol/L 133(L) 131(L) 134  Potassium 3.5 - 5.1 mmol/L 4.5 4.7 4.9  Chloride 98 - 111 mmol/L 102 100 100  CO2 22 - 32 mmol/L 24 21(L) 20  Calcium 8.9 - 10.3 mg/dL 9.1 8.8(L) 9.3  Previous medications: amlodipine, losartan, metoprolol, telmisartan,   Feels appetite has gradually decreased over the years, not on routine schedule with meals. Exercising two or three days/wk for at least 30 min. Does not follow any particular diet.  Patient checks BP at home infrequently. Recent home readings: 315V or less systolic.  Denies dizziness. Patient is currently at goal on the following medications:  . See afib/HF  We discussed diet/exercise - Maintain a healthy weight and exercise regularly, as directed by your health care provider. Eat healthy foods, such as: Lean proteins, complex carbohydrates, fresh fruits and vegetables, low-fat dairy products, healthy fats.  Plan  Continue current medications.  Hyperlipidemia   LDL goal < 70  Lipid Panel     Component Value Date/Time   CHOL 190 02/02/2020 1202   TRIG 94 02/02/2020 1202   HDL 55 02/02/2020 1202   CHOLHDL 3.5 02/02/2020 1202   CHOLHDL 2.1 09/14/2018 0932   VLDL 15 09/14/2018 0932   LDLCALC 118 (H) 02/02/2020 1202   LDLDIRECT 84 12/06/2019 1058   LDLDIRECT 74.0 07/31/2016 1012   LABVLDL 17 02/02/2020 1202   Hepatic Function Latest Ref Rng & Units 02/02/2020 12/06/2018 11/08/2018  Total Protein 6.0 - 8.5 g/dL 7.3 7.0  7.0  Albumin 3.7 - 4.7 g/dL 4.4 3.6 4.3  AST 0 - 40 IU/L 20 22 17   ALT 0 - 44 IU/L 11 15 14   Alk Phosphatase 48 - 121 IU/L 103 100 118(H)  Total Bilirubin 0.0 - 1.2 mg/dL  0.4 0.8 0.8  Bilirubin, Direct 0.00 - 0.40 mg/dL - - 0.31  Previous medications: atorvastatin (fatigue on 10 mg and 40 mg dosing), rosuvastatin 5 mg once daily, Repatha (leg weakness), Nexlizet (myalgia), Zetia 10 mg (leg pain)  Patient been started on Zetia 10 mg 03/14/2020 - no longer taking due to complaint of leg pain.  Planning to follow-up with cardiology on this, would be open to considering Praluent.   Patient is currently not at goal. . Not currently on cholesterol lowering agent  We discussed remaining alternatives for cholesterol medications - has not tried Praluent, would be open to this injection now.   Plan  Continue current medications.  AFIB   Pulse Readings from Last 3 Encounters:  04/27/20 83  03/15/20 70  03/14/20 69   Lab Results  Component Value Date   CREATININE 1.79 (H) 04/04/2020   BUN 52 (H) 04/04/2020   GFR 56.78 (L) 03/04/2018   GFRNONAA 38 (L) 04/04/2020   GFRAA 51 (L) 02/02/2020   NA 133 (L) 04/04/2020   K 4.5 04/04/2020   CALCIUM 9.1 04/04/2020   CO2 24 04/04/2020   Patient is currently rate controlled on: Marland Kitchen Carvedilol 25 mg twice daily   Digoxin 0.125 mg tablet - half tablet (0.0625 mg) once daily   Stroke prevention in atrial fibrillation. CHADS2Vasc. 4. Denies any abnormal bruising, bleeding from nose or gums or blood in urine or stool. Patient is currently controlled on the following medications:  . Xarelto 15 mg once daily with supper  Reviewed side effects - no problems noted. Discussed patient assistance programs available - declined.   Plan  Continue current medications.  Heart Failure/hyperkalemia   Feels that his fluid retention has recently increased so started doubling torsemide dose to 40 mg daily - pt states he is following-up with cardiology on  this  Type: Systolic. Last ejection fraction: EF a little higher at 35-40%, mild-moderate AI, moderate AS. On carvedilol 25 mg twice daily. Patient is currently controlled on the following medications:  . Entresto 97-103 mg twice daily . Spironolactone 25 mg once daily   On potassium binder due to RAASi tx. Denies GI effects. Potassium levels currently controlled on:  . Veltassa 8.4 g daily   Reviewed patient assistance for Entresto - declined. We discussed weighing daily - if you gain more than 3 pounds in one day or 5 pounds in one week call doctor.  Plan  Continue current medications.  GERD/Hx of GI bleed   Lab Results  Component Value Date   VITAMINB12 202 02/26/2018   Denies recent acid reflux. Long term PPI use. Currently controlled on: . Protonix 40 mg twice daily  We discussed: Avoidance of potential triggers such as alcohol, fatty foods, lying down after eating, and tomato sauce.  Plan   Continue current medications. Consider b12 lab at future office visit - not scheduled for f/u at present.  Insomnia    Waking up middle of most nights to urinate. Takes temazepam for help falling back to sleep. Denies side effects, reports ongoing benefit of taking medication. Patient is currently controlled on the following medications:  . Temazepam 15 mg as needed for sleep.   Plan  Continue current medications.  Vaccines   Immunization History  Administered Date(s) Administered  . Fluad Quad(high Dose 65+) 02/07/2019, 03/15/2020  . Influenza Split 03/04/2011, 02/04/2012  . Influenza Whole 04/17/2004, 03/31/2007, 02/17/2008, 04/12/2009, 03/13/2010  . Influenza, High Dose Seasonal PF 03/17/2016, 02/03/2017, 02/04/2018  . Influenza,inj,Quad PF,6+ Mos 02/09/2013,  03/06/2014, 01/30/2015  . PFIZER SARS-COV-2 Vaccination 07/17/2019, 08/10/2019  . Pneumococcal Conjugate-13 12/19/2014  . Pneumococcal Polysaccharide-23 04/12/2009  . Td 04/17/2004  . Zoster 09/25/2010  .  Zoster Recombinat (Shingrix) 09/22/2016, 11/22/2016   Reports Pfizer booster 01/2020, was not able to confirm date at visit.  Reviewed and discussed patient's vaccination history.    Plan  Recommended patient receive tdap in pharmacy.   Medication Management / Care Coordination   Receives prescription medications from:  [facility] [place] [facility] [place] [address] [place] [address] Phone: [phone] Fax: [phone]   Reviewed pharmacy services, would be interested pending cost review of upstream pharmacy compared to [place].  Plan  Medication cost review. ___________________________ SDOH (Social Determinants of Health) assessments performed: Yes. Future Appointments  Date Time Provider [employer]  07/17/2020 10:20 AM Larey Dresser, MD [facility] None  07/24/2020  8:40 AM [facility] DEVICE REMOTES [facility]  10/23/2020  8:40 AM [facility] DEVICE REMOTES [facility]  12/17/2020  1:30 PM [facility] CCM PHARMACIST [facility]  01/22/2021  8:40 AM [facility] DEVICE REMOTES [facility]  04/23/2021  8:40 AM [facility] DEVICE REMOTES [facility]  07/23/2021  8:40 AM [facility] DEVICE REMOTES [facility]  10/22/2021  8:40 AM [facility] DEVICE REMOTES [facility]   Visit follow-up:  . CPA follow-up: cost review. Marland Kitchen [place] follow-up: 6 month telephone f/u visit.  Madelin Rear, Pharm.D., BCGP Clinical Pharmacist [facility] [phone]

## 2020-06-08 ENCOUNTER — Other Ambulatory Visit (HOSPITAL_COMMUNITY): Payer: Self-pay | Admitting: Cardiology

## 2020-06-08 NOTE — Patient Instructions (Addendum)
[name],  Thank you for taking the time to review your medications with me today. We will perform a cost review comparing [facility] to [facility] to see if this would help from a medication cost standpoint. Please call me at [phone] if you have not heard back on this within the next weeks.  I have included care plan/clinical goals in the following pages.  Thanks! Ellin Mayhew, Pharm.D., BCGP Clinical Pharmacist [facility] [phone]   Goals Addressed            This Visit's Progress   . PharmD Care Plan       CARE PLAN ENTRY (see longitudinal plan of care for additional care plan information)  Current Barriers:  . Chronic Disease Management support, education, and care coordination needs related to Hypertension, Hyperlipidemia, and Atrial Fibrillation   Hypertension BP Readings from Last 3 Encounters:  04/27/20 92/70  03/15/20 122/66  03/14/20 102/60   . Pharmacist Clinical Goal(s): o Over the next 365 days, patient will work with PharmD and providers to maintain BP goal <130/80 . Current regimen:  . Entresto 97-103 mg twice daily (heart failure, blood pressure) . Spironolactone 25 mg once daily (heart failure, blood pressure) . Carvedilol 25 mg twice daily (heart failure, blood pressure, heart rate control) . Interventions: o Reviewed patient assistance programs for Entresto - declined at this time. o Discussed exercise/diet - Maintain a healthy weight and exercise regularly, as directed by your health care provider. Eat healthy foods, such as: Lean proteins, complex carbohydrates, fresh fruits and vegetables, low-fat dairy products, healthy fats. . Patient self care activities - Over the next 365 days, patient will: o Check BP several times per month at home, document, and provide at future appointments o Ensure daily salt intake < 2300 mg/day  Hyperlipidemia Lab Results  Component Value Date/Time    LDLCALC 118 (H) 02/02/2020 12:02 PM   LDLDIRECT 84 12/06/2019 10:58 AM   LDLDIRECT 74.0 07/31/2016 10:12 AM   . Pharmacist Clinical Goal(s): o Over the next 180 days, patient will work with PharmD and providers to achieve LDL goal < 70 . Current regimen:  o No current medications . Interventions: o We discussed remaining alternatives for cholesterol medications - no previous trial Praluent injection, open to trying next . Patient self care activities - Over the next 180 days, patient will: o At cardiology follow-up: Discuss Praluent as next option to help lower LDL to goal <70  Stroke Prevention in Atrial Fibrillation . Pharmacist Clinical Goal(s) o Over the next 180 days, patient will work with PharmD and providers to ensure medication safety and accessibility . Current regimen:  o Xarelto 15 mg once daily with supper . Interventions: o Reviewed side effects - no problems noted o Reviewed patient assistance for Xarelto - declined at this time.  . Patient self care activities - Over the next 180 days, patient will: o Continue current management  Medication management . Pharmacist Clinical Goal(s): o Over the next 365 days, patient will work with PharmD and providers to maintain optimal medication adherence . Current pharmacy: [facility] . Interventions o Comprehensive medication review performed. o Will perform cost review comparing [facility] to [facility] - will follow-up if [facility] has lower overall cost  . Patient self care activities - Over the next 365 days, patient will: o Focus on medication adherence by taking medications as prescribed o Report any questions or concerns to PharmD and/or  provider(s) Initial goal documentation.      Mr. [name] was given information about Chronic Care Management services today including:  1. CCM service includes personalized support from designated clinical staff supervised by his physician, including  individualized plan of care and coordination with other care providers 2. 24/7 contact phone numbers for assistance for urgent and routine care needs. 3. Standard insurance, coinsurance, copays and deductibles apply for chronic care management only during months in which we provide at least 20 minutes of these services. Most insurances cover these services at 100%, however patients may be responsible for any copay, coinsurance and/or deductible if applicable. This service may help you avoid the need for more expensive face-to-face services. 4. Only one practitioner may furnish and bill the service in a calendar month. 5. The patient may stop CCM services at any time (effective at the end of the month) by phone call to the office staff.  Patient agreed to services and verbal consent obtained.   The patient verbalized understanding of instructions provided today and agreed to receive a mailed copy of patient instruction and/or educational materials. Telephone follow up appointment with pharmacy team member scheduled for: See next appointment with "Care Management Staff" under "What's Next" below.   Travis [name], Pharm.D., BCGP Clinical Pharmacist [facility] [phone]  High Cholesterol  High cholesterol is a condition in which the blood has high levels of a white, waxy substance similar to fat (cholesterol). The liver makes all the cholesterol that the body needs. The human body needs small amounts of cholesterol to help build cells. A person gets extra or excess cholesterol from the food that he or she eats. The blood carries cholesterol from the liver to the rest of the body. If you have high cholesterol, deposits (plaques) may build up on the walls of your arteries. Arteries are the blood vessels that carry blood away from your heart. These plaques make the arteries narrow and stiff. Cholesterol plaques increase your risk for heart attack and stroke. Work with  your health care provider to keep your cholesterol levels in a healthy range. What increases the risk? The following factors may make you more likely to develop this condition:  Eating foods that are high in animal fat (saturated fat) or cholesterol.  Being overweight.  Not getting enough exercise.  A family history of high cholesterol (familial hypercholesterolemia).  Use of tobacco products.  Having diabetes. What are the signs or symptoms? There are no symptoms of this condition. How is this diagnosed? This condition may be diagnosed based on the results of a blood test.  If you are older than 81 years of age, your health care provider may check your cholesterol levels every 4-6 years.  You may be checked more often if you have high cholesterol or other risk factors for heart disease. The blood test for cholesterol measures:  "Bad" cholesterol, or LDL cholesterol. This is the main type of cholesterol that causes heart disease. The desired level is less than 100 mg/dL.  "Good" cholesterol, or HDL cholesterol. HDL helps protect against heart disease by cleaning the arteries and carrying the LDL to the liver for processing. The desired level for HDL is 60 mg/dL or higher.  Triglycerides. These are fats that your body can store or burn for energy. The desired level is less than 150 mg/dL.  Total cholesterol. This measures the total amount of cholesterol in your blood and includes LDL, HDL, and triglycerides. The desired level  is less than 200 mg/dL. How is this treated? This condition may be treated with:  Diet changes. You may be asked to eat foods that have more fiber and less saturated fats or added sugar.  Lifestyle changes. These may include regular exercise, maintaining a healthy weight, and quitting use of tobacco products.  Medicines. These are given when diet and lifestyle changes have not worked. You may be prescribed a statin medicine to help lower your cholesterol  levels. Follow these instructions at home: Eating and drinking  Eat a healthy, balanced diet. This diet includes: ? Daily servings of a variety of fresh, frozen, or canned fruits and vegetables. ? Daily servings of whole grain foods that are rich in fiber. ? Foods that are low in saturated fats and trans fats. These include poultry and fish without skin, lean cuts of meat, and low-fat dairy products. ? A variety of fish, especially oily fish that contain omega-3 fatty acids. Aim to eat fish at least 2 times a week.  Avoid foods and drinks that have added sugar.  Use healthy cooking methods, such as roasting, grilling, broiling, baking, poaching, steaming, and stir-frying. Do not fry your food except for stir-frying.   Lifestyle  Get regular exercise. Aim to exercise for a total of 150 minutes a week. Increase your activity level by doing activities such as gardening, walking, and taking the stairs.  Do not use any products that contain nicotine or tobacco, such as cigarettes, e-cigarettes, and chewing tobacco. If you need help quitting, ask your health care provider.   General instructions  Take over-the-counter and prescription medicines only as told by your health care provider.  Keep all follow-up visits as told by your health care provider. This is important. Where to find more information  American Heart Association: www.heart.org  National Heart, Lung, and Blood Institute: https://wilson-eaton.com/ Contact a health care provider if:  You have trouble achieving or maintaining a healthy diet or weight.  You are starting an exercise program.  You are unable to stop smoking. Get help right away if:  You have chest pain.  You have trouble breathing.  You have any symptoms of a stroke. "BE FAST" is an easy way to remember the main warning signs of a stroke: ? B - Balance. Signs are dizziness, sudden trouble walking, or loss of balance. ? E - Eyes. Signs are trouble seeing or a  sudden change in vision. ? F - Face. Signs are sudden weakness or numbness of the face, or the face or eyelid drooping on one side. ? A - Arms. Signs are weakness or numbness in an arm. This happens suddenly and usually on one side of the body. ? S - Speech. Signs are sudden trouble speaking, slurred speech, or trouble understanding what people say. ? T - Time. Time to call emergency services. Write down what time symptoms started.  You have other signs of a stroke, such as: ? A sudden, severe headache with no known cause. ? Nausea or vomiting. ? Seizure. These symptoms may represent a serious problem that is an emergency. Do not wait to see if the symptoms will go away. Get medical help right away. Call your local emergency services (911 in the [place] Do not drive yourself to the hospital. Summary  Cholesterol plaques increase your risk for heart attack and stroke. Work with your health care provider to keep your cholesterol levels in a healthy range.  Eat a healthy, balanced diet, get regular exercise, and maintain a  healthy weight.  Do not use any products that contain nicotine or tobacco, such as cigarettes, e-cigarettes, and chewing tobacco.  Get help right away if you have any symptoms of a stroke. This information is not intended to replace advice given to you by your health care provider. Make sure you discuss any questions you have with your health care provider. Document Revised: 04/11/2019 Document Reviewed: 04/11/2019 Elsevier Patient Education  2021 [employer].

## 2020-06-13 ENCOUNTER — Other Ambulatory Visit (HOSPITAL_COMMUNITY): Payer: Self-pay | Admitting: *Deleted

## 2020-06-13 MED ORDER — VELTASSA 8.4 G PO PACK: 8.4000 g | PACK | Freq: Every day | ORAL | 5 refills | Status: DC

## 2020-07-03 ENCOUNTER — Other Ambulatory Visit (HOSPITAL_COMMUNITY): Payer: Self-pay | Admitting: Cardiology

## 2020-07-10 ENCOUNTER — Other Ambulatory Visit: Payer: Self-pay

## 2020-07-17 ENCOUNTER — Ambulatory Visit (HOSPITAL_COMMUNITY)
Admission: RE | Admit: 2020-07-17 | Discharge: 2020-07-17 | Disposition: A | Discharge status: Home / Self Care | Payer: Medicare Other | Source: Ambulatory Visit | Attending: Cardiology | Admitting: Cardiology

## 2020-07-17 ENCOUNTER — Encounter (HOSPITAL_COMMUNITY): Payer: Self-pay | Admitting: Cardiology

## 2020-07-17 ENCOUNTER — Other Ambulatory Visit: Payer: Self-pay

## 2020-07-17 ENCOUNTER — Other Ambulatory Visit (HOSPITAL_COMMUNITY): Payer: Self-pay | Admitting: Cardiology

## 2020-07-17 ENCOUNTER — Telehealth (HOSPITAL_COMMUNITY): Payer: Self-pay | Admitting: Pharmacy Technician

## 2020-07-17 ENCOUNTER — Telehealth (HOSPITAL_COMMUNITY): Payer: Self-pay

## 2020-07-17 VITALS — BP 94/60 | HR 73 | Wt 171.4 lb

## 2020-07-17 DIAGNOSIS — I6529 Occlusion and stenosis of unspecified carotid artery: Secondary | ICD-10-CM | POA: Insufficient documentation

## 2020-07-17 DIAGNOSIS — Z7901 Long term (current) use of anticoagulants: Secondary | ICD-10-CM | POA: Diagnosis not present

## 2020-07-17 DIAGNOSIS — I447 Left bundle-branch block, unspecified: Secondary | ICD-10-CM | POA: Diagnosis not present

## 2020-07-17 DIAGNOSIS — E785 Hyperlipidemia, unspecified: Secondary | ICD-10-CM | POA: Insufficient documentation

## 2020-07-17 DIAGNOSIS — Z79899 Other long term (current) drug therapy: Secondary | ICD-10-CM | POA: Insufficient documentation

## 2020-07-17 DIAGNOSIS — I359 Nonrheumatic aortic valve disorder, unspecified: Secondary | ICD-10-CM | POA: Diagnosis not present

## 2020-07-17 DIAGNOSIS — I4819 Other persistent atrial fibrillation: Secondary | ICD-10-CM | POA: Insufficient documentation

## 2020-07-17 DIAGNOSIS — I071 Rheumatic tricuspid insufficiency: Secondary | ICD-10-CM | POA: Diagnosis not present

## 2020-07-17 DIAGNOSIS — I2510 Atherosclerotic heart disease of native coronary artery without angina pectoris: Secondary | ICD-10-CM | POA: Diagnosis not present

## 2020-07-17 DIAGNOSIS — I5022 Chronic systolic (congestive) heart failure: Secondary | ICD-10-CM | POA: Diagnosis not present

## 2020-07-17 DIAGNOSIS — E7849 Other hyperlipidemia: Secondary | ICD-10-CM

## 2020-07-17 DIAGNOSIS — Z87891 Personal history of nicotine dependence: Secondary | ICD-10-CM | POA: Diagnosis not present

## 2020-07-17 DIAGNOSIS — I771 Stricture of artery: Secondary | ICD-10-CM | POA: Diagnosis not present

## 2020-07-17 DIAGNOSIS — M25569 Pain in unspecified knee: Secondary | ICD-10-CM | POA: Insufficient documentation

## 2020-07-17 DIAGNOSIS — I130 Hypertensive heart and chronic kidney disease with heart failure and stage 1 through stage 4 chronic kidney disease, or unspecified chronic kidney disease: Secondary | ICD-10-CM | POA: Insufficient documentation

## 2020-07-17 DIAGNOSIS — N183 Chronic kidney disease, stage 3 unspecified: Secondary | ICD-10-CM | POA: Insufficient documentation

## 2020-07-17 LAB — BASIC METABOLIC PANEL
Anion gap: 10 (ref 5–15)
BUN: 39 mg/dL — ABNORMAL HIGH (ref 8–23)
CO2: 20 mmol/L — ABNORMAL LOW (ref 22–32)
Calcium: 8.8 mg/dL — ABNORMAL LOW (ref 8.9–10.3)
Chloride: 103 mmol/L (ref 98–111)
Creatinine, Ser: 1.57 mg/dL — ABNORMAL HIGH (ref 0.61–1.24)
GFR, Estimated: 44 mL/min — ABNORMAL LOW (ref >60)
Glucose, Bld: 111 mg/dL — ABNORMAL HIGH (ref 70–99)
Potassium: 4.9 mmol/L (ref 3.5–5.1)
Sodium: 133 mmol/L — ABNORMAL LOW (ref 135–145)

## 2020-07-17 LAB — DIGOXIN LEVEL: Digoxin Level: 1.2 ng/mL (ref 0.8–2.0)

## 2020-07-17 MED ORDER — EMPAGLIFLOZIN 10 MG PO TABS: 10.0000 mg | ORAL_TABLET | Freq: Every day | ORAL | 11 refills | Status: DC

## 2020-07-17 MED ORDER — TORSEMIDE 20 MG PO TABS
20.0000 mg | ORAL_TABLET | ORAL | 3 refills | Status: DC
Start: 2020-07-17 — End: 2020-07-25

## 2020-07-17 MED ORDER — EMPAGLIFLOZIN 10 MG PO TABS
10.0000 mg | ORAL_TABLET | Freq: Every day | ORAL | 11 refills | Status: DC
Start: 2020-07-17 — End: 2020-08-30

## 2020-07-17 NOTE — Patient Instructions (Addendum)
Labs done today. We will contact you only if your labs are abnormal.  START Jardiance 10mg  (1 tablet) by mouth daily.  DECREASE Torsemide to 20mg  (1 tablet) by mouth every other day.  No other medication changes were made. Please continue all current medications as prescribed.  You have been referred to the [facility]. They will contact you to schedule an appointment.  Your physician recommends that you schedule a follow-up appointment in: 10 days for a lab only appointment, and in 3 months for an appointment with [name] with an echo prior to your appointment.  Your physician has requested that you have an echocardiogram. Echocardiography is a painless test that uses sound waves to create images of your heart. It provides your doctor with information about the size and shape of your heart and how well your heart's chambers and valves are working. This procedure takes approximately one hour. There are no restrictions for this procedure.   If you have any questions or concerns before your next appointment please send [place] a message through [place] or call our office at [phone].    TO LEAVE A MESSAGE FOR THE NURSE SELECT OPTION 2, PLEASE LEAVE A MESSAGE INCLUDING: . YOUR NAME . DATE OF BIRTH . CALL BACK NUMBER . REASON FOR CALL**this is important as we prioritize the call backs  YOU WILL RECEIVE A CALL BACK THE SAME DAY AS LONG AS YOU CALL BEFORE 4:00 PM   Do the following things EVERYDAY: 1) Weigh yourself in the morning before breakfast. Write it down and keep it in a log. 2) Take your medicines as prescribed 3) Eat low salt foods--Limit salt (sodium) to 2000 mg per day.  4) Stay as active as you can everyday 5) Limit all fluids for the day to less than 2 liters   At the [facility], you and your health needs are our priority. As part of our continuing mission to provide you with exceptional heart care, we have created designated Provider Care Teams. These  Care Teams include your primary Cardiologist (physician) and Advanced Practice Providers (APPs- Physician Assistants and Nurse Practitioners) who all work together to provide you with the care you need, when you need it.   You may see any of the following providers on your designated Care Team at your next follow up: Marland Kitchen Dr Glori Bickers . Dr [name] . Darrick Grinder, NP . Lyda Jester, PA . Audry Riles, PharmD   Please be sure to bring in all your medications bottles to every appointment.

## 2020-07-17 NOTE — Progress Notes (Deleted)
Medication Samples have been provided to the patient.  Drug name: Jardiance       Strength: 10mg         Qty: 2  LOT: [id number]  Exp.Date: 01/2022  Dosing instructions: take 1 tab po qd  The patient has been instructed regarding the correct time, dose, and frequency of taking this medication, including desired effects and most common side effects.   Mardene Lessig R Khyree Carillo 62:95 AM 07/17/2020

## 2020-07-17 NOTE — Telephone Encounter (Signed)
Patient advised and verbalized understanding. Patient will call if he starts to feel worse. Med list updated to reflect changes.

## 2020-07-17 NOTE — Progress Notes (Signed)
Date:  07/17/2020   ID:  Travis [name], DOB Jan 07, 1941, MRN [id number]   Provider location: [facility] Type of Visit: Established patient   PCP:  [name], MD  Cardiologist: [name] EP: [name]/[name]   History of Present Illness: Travis [name] is a 80 y.o. male with h/o persistent atrial fibrillation s/p multiple failed DCCV, failed Tikosyn, ERAF after atrial fibrillation ablation, bicuspid AV with moderate AS and Mild AI, HTN, HLD, and chronic systolic CHF (EF 27-03%) with presumed tachy-mediated CMP.   Pt failed DCCV 08/14/17, 08/31/17, and 09/17/17. Failed Tikosyn. Did have conversion with Amiodarone and DCCV. Pt underwent successful ablation 10/07/17.  However, he had ERAF.  Admitted 5/18 - 10/14/17 with atrial fibrillation/RVR and acute on chronic systolic CHF.  Echo showed EF 30-35%. Diuresed and reloaded with Amio. Underwent successful DCCV 10/13/17.    Echo in 9/19 showed improvement in EF to 50% in NSR.   In 9/19, patient reported profound fatigue.  He was volume overloaded on exam and diuretic was increased.  CBC was done, hgb was low and patient ended up going to the ER for admission.  He was diagnosed with UGIB likely from duodenal ulcer, probably due to meloxicam.  This was stopped.   Again in 10/19, he was profoundly fatigued.  CBC showed hgb 6.7, he was sent to the ER where he had 2 unit transfusion and EGD was done again, showing duodenal stenosis.  I also stopped his atorvastatin.   He went back into atrial fibrillation and had successful TEE-DCCV in 2/20.  TEE showed EF 35-40% with mildly decreased RV systolic function, EF back down in setting of atrial fibrillation.  In 3/20, he was back in atrial fibrillation and has been in atrial fibrillation since then.  He saw [name] and it was decided not to pursue redo ablation.  Repeat echo in 7/20 showed EF 20-25%, moderately decreased RV systolic function, moderate-severe TR,  bicuspid aortic valve with moderate AI, dilated IVC.  Due to permanent atrial fibrillation and rapid rate at times with LBBB, it was decided to pursue AV nodal ablation with BiV pacing. He had Medtronic CRT-P placement in 8/20, then AV nodal ablation in 9/20.   Echo in 2/21 showed EF 35-40% with global hypokinesis, mildly decreased RV systolic function, mild MR, mild-moderate AI with moderate AS.   He has not tolerated Repatha, Zetia, or Nexlizet due to muscle weakness.    Patient returns for followup of CHF and atrial fibrillation.  He remains in atrial fibrillation with BiV pacing s/p AV nodal ablation.  He feels like his energy level is not as good as in the past, but not getting short of breath with exertion.  No problems walking up stairs.  Works out with a Clinical research associate 2 times a week.  No chest pain.  No orthopnea/PND.  Limited by knee pain more than anything else.   Medtronic device interrogation: >99% BiV pacing.  Stable thoracic impedance. No VT/AF.   Labs (3/19): LDL 65 Labs (8/19): K 5.2 => 4.1, creatinine 1.6 Labs (9/19): K 3.9, creatinine 1.78, hgb 9.6, TSH normal.  Labs (10/19): hgb 6.7 => 11.8, LFTs normal, K 3.6, creatinine 1.3 Labs (11/19): LFTs, TSH normal Labs (1/20): K 4, creatinine 1.15, hgb 14.5 Labs (2/20): Hgb 13.4 Labs (4/20): LFTs normal, TSH elevated Labs (6/20): LDL 38, HDL 64, K 4.2, creatinine 1.45, hgb 14.7 Labs (7/20): K 4.4, creatinine 1.43, TSH and free T3 normal,  mildly elevated free T4, LFTs normal Labs (8/20): K 4.9, creatinine 1.26, digoxin 0.9 Labs (9/20): K 4.3, creatinine 1.44 Labs (12/20): digoxin 0.7, K 5, creatinine 1.36 Labs (3/21): K 4.7, creatinine 1.31 Labs (7/21): K 4.7, creatinine 1.49, LDL 84 Labs (9/21): K 4.9, creatinine 1.49, LDL 118 Labs (10/21): digoxin 0.6 Labs (11/21): K 4.5, creatinine 1.79  Past Medical History 1. Chronic systolic CHF: Possible tachy-mediated cardiomyopathy.    - Echo(08/13/2017): LVEF 30-35%, moderate AS, mild  AI, severe LAE, RV mild dilated with systolic function mild/mod reduced, PA peak pressure 46 mm Hg. - Echo (9/19): EF 50%, mild diffuse hypokinesis, normal RV size and systolic function, PASP 70 mmHg, moderate AS, moderate AI, moderate MR.  - TEE (2/20): EF 35-40%, mild-moderate LV dilation, normal RV size with mildly decreased systolic function, mil MR, trileaflet aortic valve with mild AS (mean gradient 10 mmHg, AVA 1.7 cm^2), moderate AI.  - Echo (7/20): EF 20-25%, moderately decreased RV systolic function, moderate-severe TR, bicuspid aortic valve with moderate AI, dilated IVC.  - Medtronic CRT-P placement 8/20.  - AV nodal ablation 9/20 - Echo (2/21): EF 35-40% with global hypokinesis, mildly decreased RV systolic function, mild MR, mild-moderate AI with moderate AS.  2. Atrial fibrillation: Has been difficult to control.  Failed Tikosyn and multiple DCCVs.  Failed amiodarone prior to ablation.  Atrial fibrillation ablation but then had ERAF.  He was cardioverted again in 5/19 with return to NSR.  DCCV again in 2/20 but back in atrial fibrillation in 3/20.  3. Aortic valve disorder: TEE 2/20 with mild AS, moderate AI (valve is not bicuspid).   - Echo 2/21 with mild-moderate AI, moderate AS.  4. HTN 5. Hyperlipidemia: Severe fatigue with atorvastatin, myalgias with Crestor.  6. Mitral regurgitation: Mild on last echo.  7. CAD: coronary calcium noted on chest CT.  8. CKD: Stage 3.  9. Carotid stenosis: Carotid dopplers (6/96) with 29-52% LICA stenosis.  - Carotid dopplers (8/41): 32-44% LICA stenosis.  - Carotid dopplers (0/10): 27-25% LICA stenosis.  10. Chronic LBBB 11. PUD: UGIB in 9/19 with duodenal ulcer probably due to meloxicam use.  - EGD (10/19): Duodenal stenosis.  12. Inguinal hernia 13. Suspected left subclavian stenosis with unequal pressures in arms.    Current Outpatient Medications  Medication Sig Dispense Refill  . acetaminophen (TYLENOL) 500 MG tablet Take 1,000 mg by  mouth every 8 (eight) hours as needed for moderate pain or headache.    . carvedilol (COREG) 25 MG tablet TAKE 1 TABLET(25 MG) BY MOUTH TWICE DAILY 180 tablet 2  . Coenzyme Q10 200 MG capsule Take 1 capsule (200 mg total) by mouth daily. 90 capsule 3  . ENTRESTO 97-103 MG TAKE 1 TABLET BY MOUTH TWICE DAILY 60 tablet 6  . finasteride (PROPECIA) 1 MG tablet TAKE 1 TABLET BY MOUTH EVERY DAY 90 tablet 3  . fluticasone (FLONASE) 50 MCG/ACT nasal spray Place 2 sprays into both nostrils daily. 16 g 3  . Multiple Vitamins-Minerals (PRESERVISION AREDS 2+MULTI VIT PO) Take 1 tablet by mouth in the morning and at bedtime.    . mupirocin ointment (BACTROBAN) 2 % APPLY EXTERNALLY TO FACE EVERY DAY AS NEEDED    . pantoprazole (PROTONIX) 40 MG tablet Take 1 tablet (40 mg total) by mouth 2 (two) times daily. 180 tablet 0  . patiromer (VELTASSA) 8.4 g packet Take 1 packet (8.4 g total) by mouth daily. 30 packet 5  . Rivaroxaban (XARELTO) 15 MG TABS tablet TAKE 1 TABLET  BY MOUTH DAILY WITH SUPPER 90 tablet 3  . spironolactone (ALDACTONE) 25 MG tablet Take 1 tablet (25 mg total) by mouth daily. 90 tablet 3  . temazepam (RESTORIL) 15 MG capsule TAKE 1 CAPSULE(15 MG) BY MOUTH AT BEDTIME AS NEEDED FOR SLEEP 30 capsule 5  . traMADol (ULTRAM) 50 MG tablet TAKE 1 TABLET BY MOUTH THREE TIMES DAILY AS NEEDED FOR MODERATE OR SEVERE ARTHRITIS PAIN IN HANDS AND NECK 90 tablet 1  . empagliflozin (JARDIANCE) 10 MG TABS tablet Take 1 tablet (10 mg total) by mouth daily before breakfast. 30 tablet 11  . torsemide (DEMADEX) 20 MG tablet Take 1 tablet (20 mg total) by mouth every other day. 45 tablet 3   No current facility-administered medications for this encounter.    Allergies:   Atorvastatin, Nexlizet [bempedoic acid-ezetimibe], Repatha [evolocumab], and Rosuvastatin   Social History:  The patient  reports that he quit smoking about 34 years ago. He has a 37.50 pack-year smoking history. He has never used smokeless tobacco.  He reports previous alcohol use of about 1.0 standard drink of alcohol per week. He reports that he does not use drugs.   Family History:  The patient's family history includes Lung cancer (age of onset: 19) in his mother; Stroke in his brother and mother; Stroke (age of onset: 33) in his father.   ROS:  Please see the history of present illness.   All other systems are personally reviewed and negative.   Exam:   BP 94/60   Pulse 73   Wt 77.7 kg (171 lb 6.4 oz)   SpO2 100%   BMI 26.06 kg/m  General: NAD Neck: No JVD, no thyromegaly or thyroid nodule.  Lungs: Clear to auscultation bilaterally with normal respiratory effort. CV: Nondisplaced PMI.  Heart regular S1/S2, no S3/S4, 1/6 SEM RUSB.  No peripheral edema.  No carotid bruit.  Normal pedal pulses.  Abdomen: Soft, nontender, no hepatosplenomegaly, no distention.  Skin: Intact without lesions or rashes.  Neurologic: Alert and oriented x 3.  Psych: Normal affect. Extremities: No clubbing or cyanosis.  HEENT: Normal.   Recent Labs: 12/13/2019: Hemoglobin 12.8; Platelets 171 02/02/2020: ALT 11 07/17/2020: BUN 39; Creatinine, Ser 1.57; Potassium 4.9; Sodium 133  Personally reviewed   Wt Readings from Last 3 Encounters:  07/17/20 77.7 kg (171 lb 6.4 oz)  04/27/20 79.7 kg (175 lb 9.6 oz)  03/15/20 78.8 kg (173 lb 12.8 oz)    ASSESSMENT AND PLAN:  1. Chronic systolic CHF: Echo 9/47 with EF 30-35%, mild to moderately decreased RV systolic function in setting of atrial fibrillation with RVR.  Echo in 9/19 with patient in NSR showed EF up to 50%, mild diffuse hypokinesis.  Back in atrial fibrillation in 2/20, TEE showed EF 35-40%.  Suspect he has atrial fibrillation/tachycardia-mediated cardiomyopathy given fall in EF with recurrent atrial fibrillation.  Echo in 7/20 with EF 20-25%, moderately decreased RV systolic function.  He is now s/p Medtronic CRT-P placement with AV nodal ablation. Echo in 2/21 showed EF 35-40% (some improvement).   NYHA class II symptoms.  He is not volume overloaded on exam or by Optivol.   - Continue Entresto 97/103 bid. BMET today.     - Continue digoxin 0.0625, check level today.  - Continue Coreg 25 mg bid.     - Continue spironolactone 25 mg daily.  - Continue Veltassa to control K and allow spironolactone and Entresto use.  - Decrease torsemide to 20 mg every other day and add  Farxiga 10 mg daily. BMET in 10 days.  - Repeat echo at followup in 3 months.  2. Atrial fibrillation: Persistent.  Difficult to control rate and rhythm. He failed Tikosyn. He started on amiodarone, then had atrial fibrillation ablation in 5/19 with ERAF, requiring DCCV.  Most recently cardioverted in 2/20 but back in atrial fibrillation by 3/20 and has stayed in atrial fibrillation since.  He saw [name] and they decided against redo ablation. He is now s/p AV nodal ablation.    - He is off amiodarone given permanent atrial fibrillation.   - Continue Xarelto 15 mg daily, CBC today.  3. CAD: Extensive coronary calcification on last CT chest. No chest pain. I suspect that his cardiomyopathy is tachycardia-mediated/atrial fibrillation-related given rise in EF in NSR and then fall when back in atrial fibrillation rather than ischemic cardiomyopathy.  - No ASA with Xarelto use. - Unable to tolerate statins, Repatha, Zetia or Nexlizet.  I will refer him to lipid clinic to see if he can get inclisiran (every 6 month injection).      4. CKD: Stage 3. BMET today.  5. Aortic valve disease: TEE in 2/20 showed that aortic valve was not bicuspid (though TTE reports have read bicuspid) but did have mild stenosis and moderate AI.  Moderate AI on 7/20 echo.  Echo in 2/21 showed mild-moderate AI, moderate AS. Follow the valve closely over time.  6. Tricuspid regurgitation: Moderate-severe on 7/20 echo, not seen on 2/21 echo.   7. LBBB: Chronic, now s/p CRT.  8. Hyperlipidemia: Unable to tolerate statins, Zetia, Nexlizet, or Repatha.   -  Refer to lipid clinic for [place].  9. Carotid stenosis: Repeat carotid dopplers in 6/22.   10. Subclavian stenosis: Suspect left subclavian stenosis.  Not noted on carotid dopplers, but large pulse differential between the arm and difficult to palpate left radial pulse.  Seems asymptomatic.  - Medical management, control lipids.  - Take BP in right arm.   Recommended follow-up:  3 months with echo.   Signed, [name], MD  07/17/2020  [facility] [address] [facility] [place] [address] [phone] (office) [phone] (fax)

## 2020-07-17 NOTE — Telephone Encounter (Signed)
-----   Message from Larey Dresser, MD sent at 07/17/2020  3:04 PM EST ----- I think he can stop digoxin.  Call in if he feels worse after stopping it (with some improvement in EF and elevated creatinine, think he can do without).

## 2020-07-17 NOTE — Telephone Encounter (Signed)
Patient was seen in clinic today, had some concerns over medication co-pays. His Veltassa is $515 for 30 days. Delene Loll was about $280. He was started on Farxiga, upon further review his insurance prefers [facility]. Current 30 day Jardaince co-pay is $120.88.  I came and spoke with the patient to discuss assistance options. Unfortunately, he is over the income for any type patient assistance. I discussed the option of placing him on the PAN grant wait list for the [facility]. The patient stated that it was not necessary at this time. I was able to provide him 20days of Veltassa samples to help ease the burden of the co-pay at this time. I encouraged the patient to reach back out to me in the future if samples are needed.   LOT CFBBD EXP 04/23  Spent about 20 minutes discussing with the patient.  Charlann Boxer

## 2020-07-24 ENCOUNTER — Ambulatory Visit (INDEPENDENT_AMBULATORY_CARE_PROVIDER_SITE_OTHER): Payer: Medicare Other

## 2020-07-24 DIAGNOSIS — I420 Dilated cardiomyopathy: Secondary | ICD-10-CM

## 2020-07-24 LAB — CUP PACEART REMOTE DEVICE CHECK
Battery Remaining Longevity: 78 mo
Battery Voltage: 2.99 V
Brady Statistic AP VP Percent: 0 %
Brady Statistic AP VS Percent: 0 %
Brady Statistic AS VP Percent: 94.01 %
Brady Statistic AS VS Percent: 5.99 %
Brady Statistic RA Percent Paced: 0 %
Brady Statistic RV Percent Paced: 94 %
Date Time Interrogation Session: 20220301045828
Implantable Lead Implant Date: 20200831
Implantable Lead Implant Date: 20200831
Implantable Lead Location: 753858
Implantable Lead Location: 753860
Implantable Lead Model: 5076
Implantable Pulse Generator Implant Date: 20200831
Lead Channel Impedance Value: 1026 Ohm
Lead Channel Impedance Value: 1026 Ohm
Lead Channel Impedance Value: 1026 Ohm
Lead Channel Impedance Value: 342 Ohm
Lead Channel Impedance Value: 3420 Ohm
Lead Channel Impedance Value: 3420 Ohm
Lead Channel Impedance Value: 494 Ohm
Lead Channel Impedance Value: 513 Ohm
Lead Channel Impedance Value: 551 Ohm
Lead Channel Impedance Value: 570 Ohm
Lead Channel Impedance Value: 570 Ohm
Lead Channel Impedance Value: 931 Ohm
Lead Channel Impedance Value: 988 Ohm
Lead Channel Impedance Value: 988 Ohm
Lead Channel Pacing Threshold Amplitude: 0.625 V
Lead Channel Pacing Threshold Amplitude: 2 V
Lead Channel Pacing Threshold Pulse Width: 0.4 ms
Lead Channel Pacing Threshold Pulse Width: 0.7 ms
Lead Channel Sensing Intrinsic Amplitude: 12.5 mV
Lead Channel Sensing Intrinsic Amplitude: 12.5 mV
Lead Channel Setting Pacing Amplitude: 2.5 V
Lead Channel Setting Pacing Amplitude: 2.75 V
Lead Channel Setting Pacing Pulse Width: 0.4 ms
Lead Channel Setting Pacing Pulse Width: 0.7 ms
Lead Channel Setting Sensing Sensitivity: 2.8 mV

## 2020-07-25 ENCOUNTER — Telehealth (HOSPITAL_COMMUNITY): Payer: Self-pay | Admitting: *Deleted

## 2020-07-25 DIAGNOSIS — I5022 Chronic systolic (congestive) heart failure: Secondary | ICD-10-CM

## 2020-07-25 MED ORDER — TORSEMIDE 20 MG PO TABS
20.0000 mg | ORAL_TABLET | Freq: Every day | ORAL | 3 refills | Status: DC
Start: 2020-07-25 — End: 2020-10-23

## 2020-07-25 NOTE — Telephone Encounter (Signed)
Symptoms may be because we decreased torsemide also.  Have him try continuing Jardiance but increasing torsemide back to 20 mg daily. BMET on Monday.  If symptoms are not better with this change by Monday or so, can stop Jardiance.

## 2020-07-25 NOTE — Telephone Encounter (Signed)
Pt aware and verbalized understanding. Pt will have bmet drawn at lipid clinic on Monday.

## 2020-07-25 NOTE — Telephone Encounter (Signed)
Pt called stating he started jardiance last week he's had shortness of breath and no energy. Pt wants to stop taking medication but wanted to get advice from [facility]   Routed to [facility] for advice

## 2020-07-27 ENCOUNTER — Other Ambulatory Visit (HOSPITAL_COMMUNITY): Payer: Medicare Other

## 2020-07-28 IMAGING — CR DG CHEST 2V
2 series · 2 of 2 positions shown · non-contrast
Comparison: 10/10/2017 and earlier

CLINICAL DATA: Post op films for pacemaker insertion, check
placement. Patient cannot raise left arm for lateral.

EXAM:
CHEST - 2 VIEW

[w chest pa]
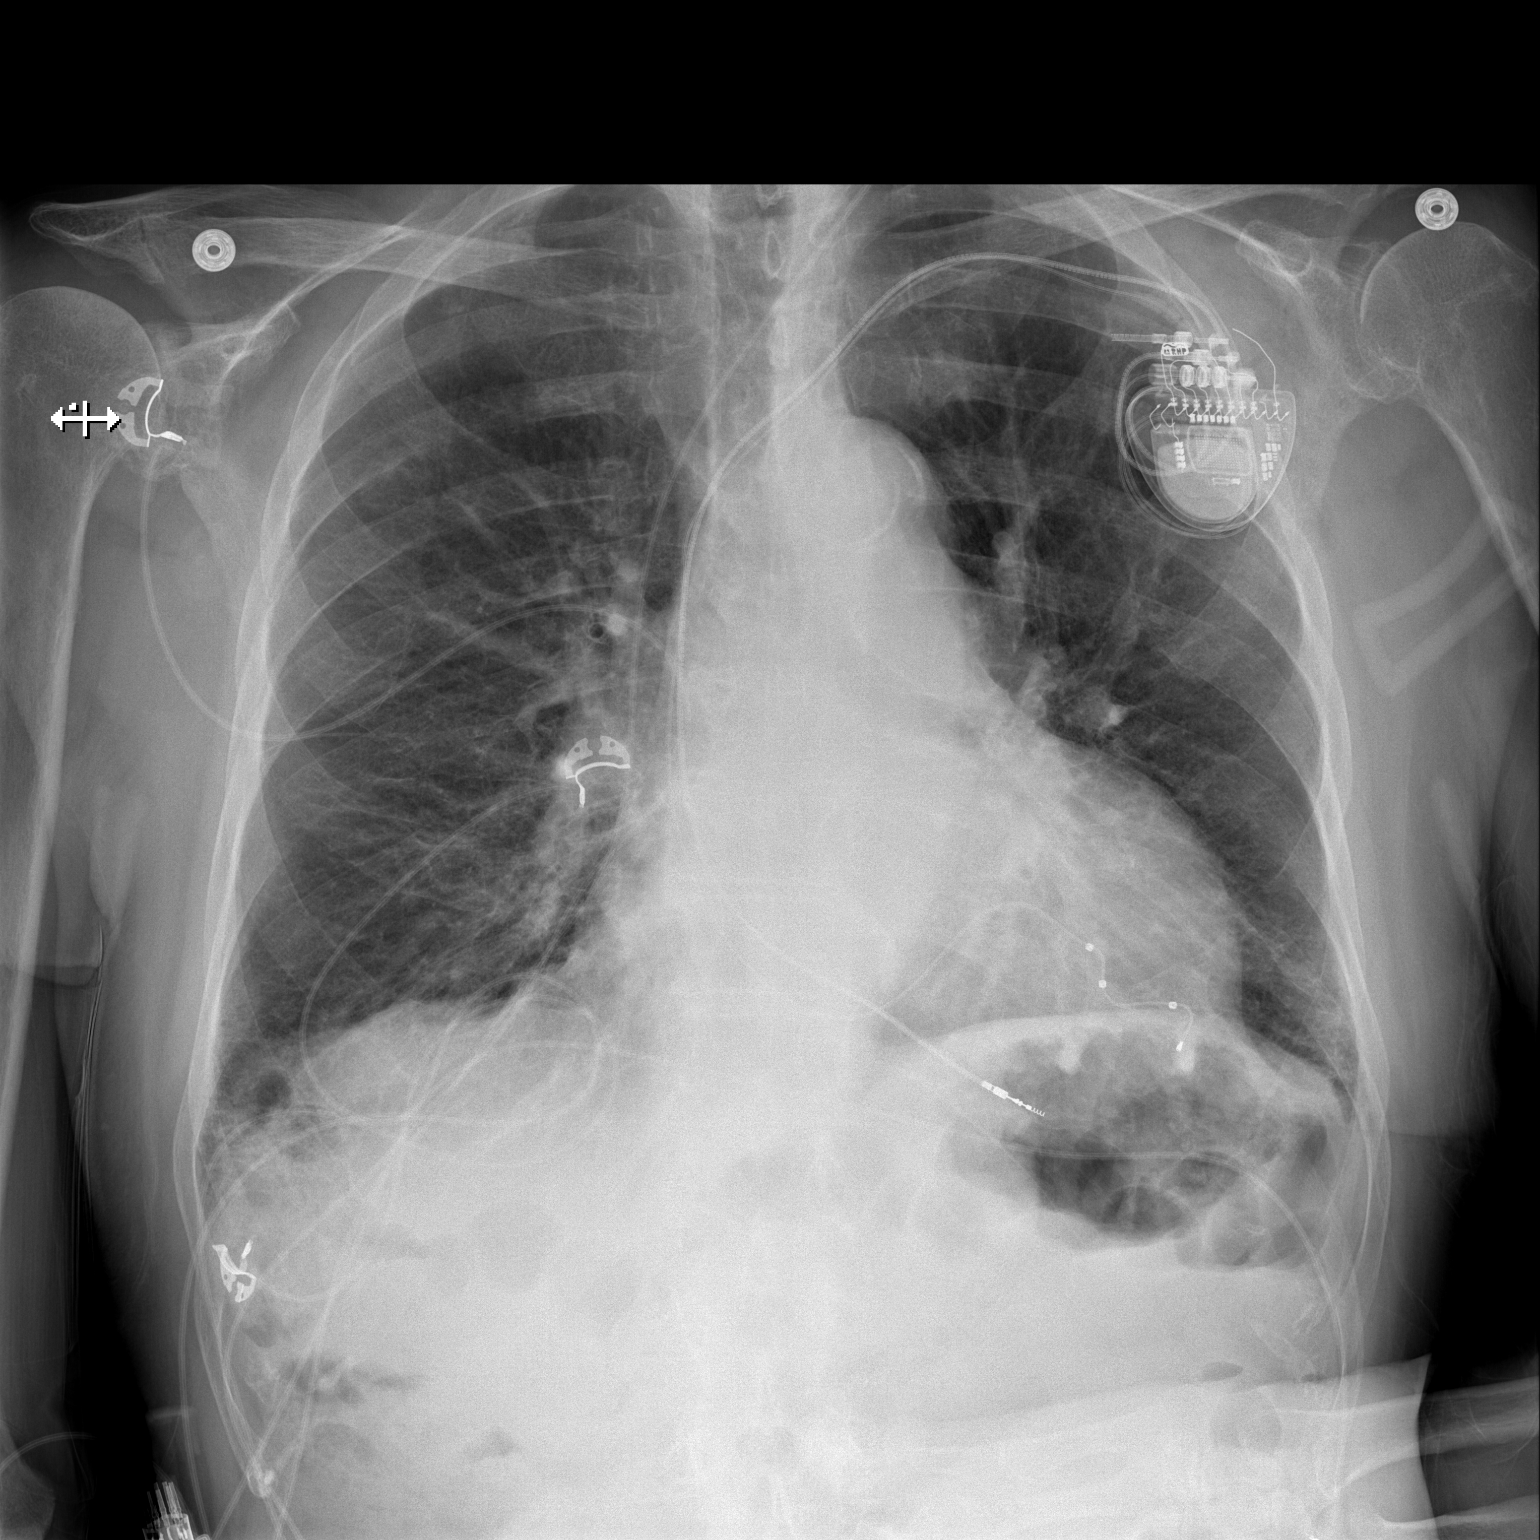

[w chest lat]
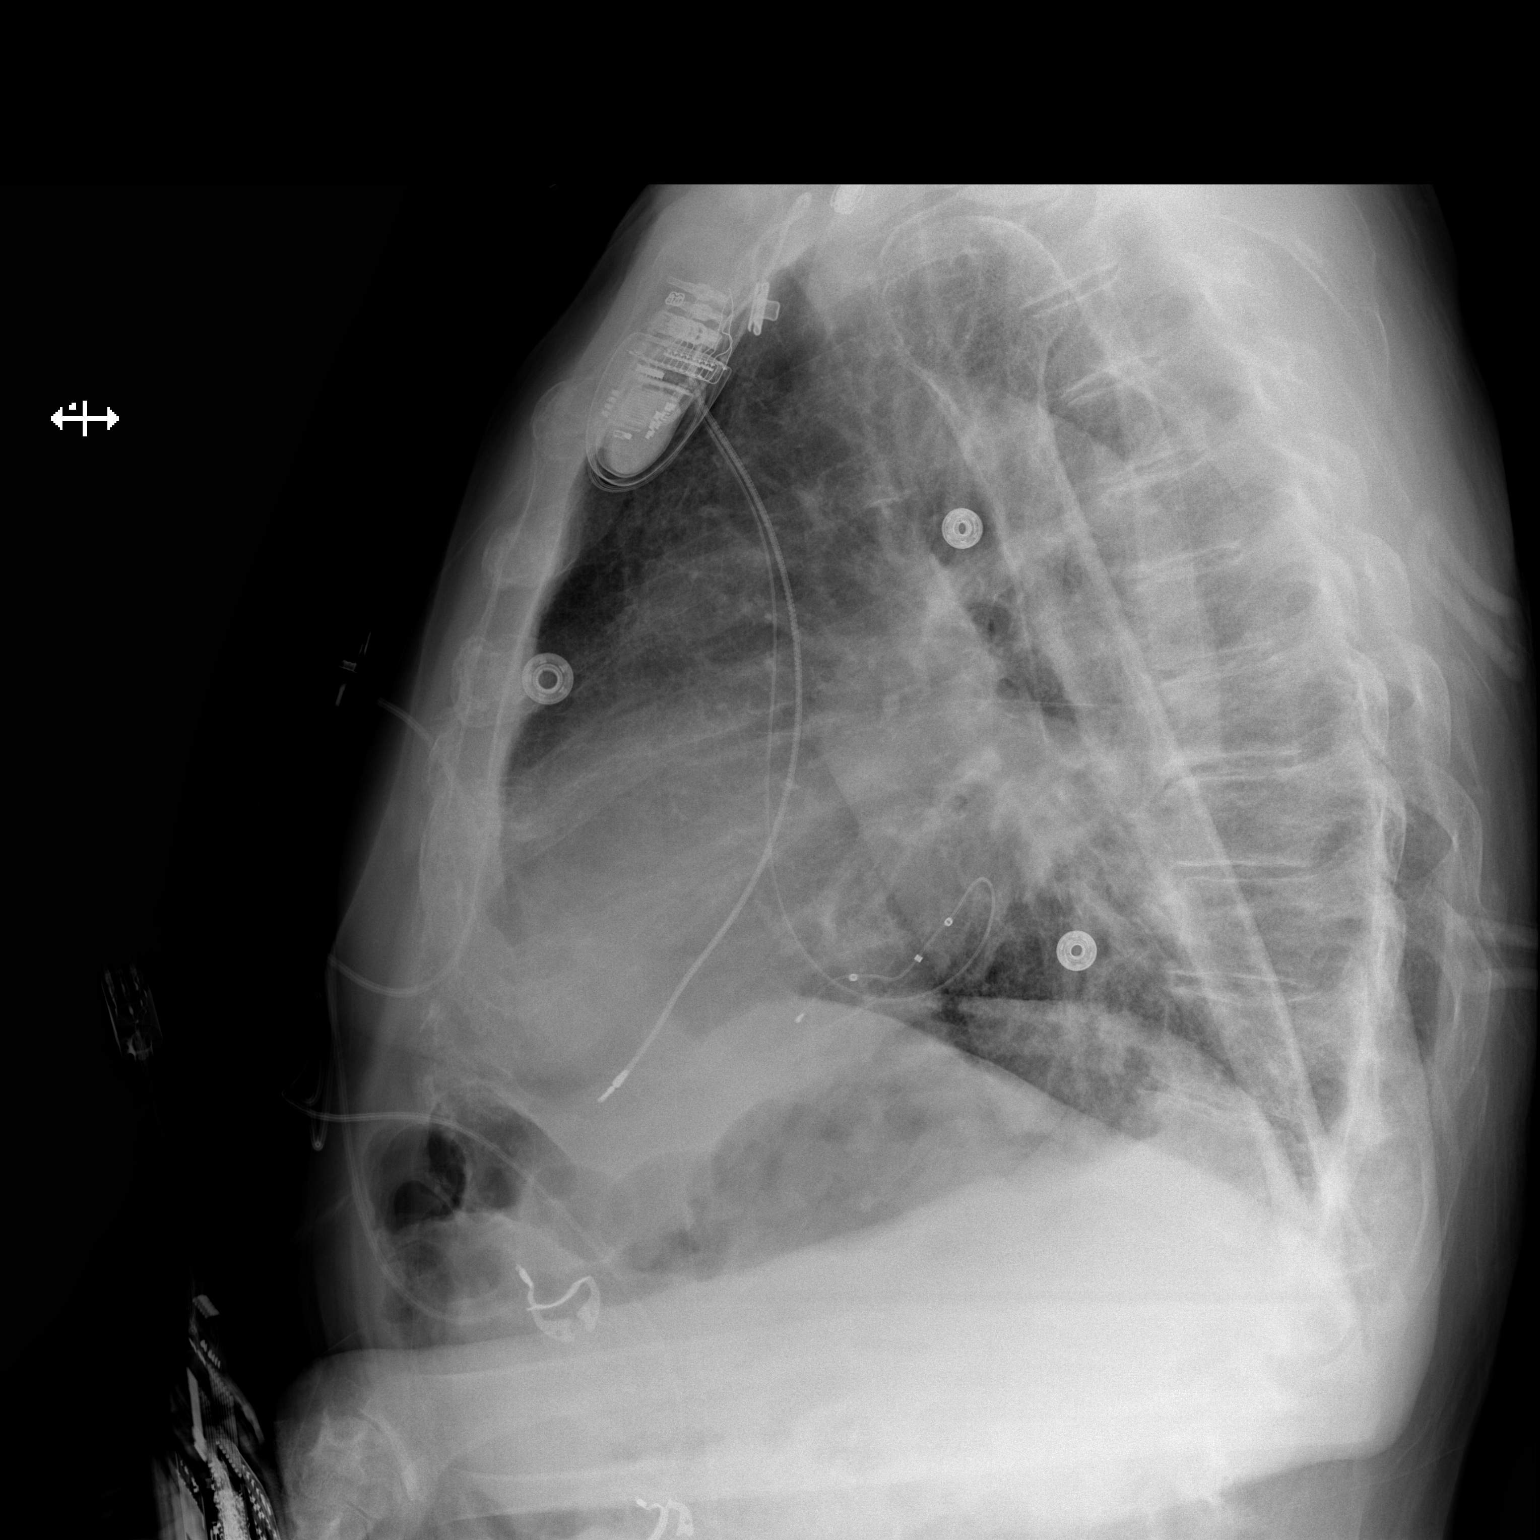

[2 of 2 positions shown; findings below may reference images not displayed]

FINDINGS: Patient has a LEFT-sided transvenous pacemaker with leads to the
RIGHT ventricle and coronary sinus. There is no pneumothorax. The
heart is mildly enlarged. There is minimal bibasilar atelectasis.
There is atherosclerotic calcification of the thoracic aorta.
IMPRESSION: Interval placement of LEFT-sided transvenous pacemaker. No
pneumothorax.

Bibasilar atelectasis.

## 2020-07-30 ENCOUNTER — Other Ambulatory Visit: Payer: Medicare Other | Admitting: *Deleted

## 2020-07-30 ENCOUNTER — Other Ambulatory Visit: Payer: Self-pay

## 2020-07-30 ENCOUNTER — Ambulatory Visit (INDEPENDENT_AMBULATORY_CARE_PROVIDER_SITE_OTHER): Payer: Medicare Other | Admitting: Pharmacist

## 2020-07-30 ENCOUNTER — Encounter: Payer: Self-pay | Admitting: Pharmacist

## 2020-07-30 DIAGNOSIS — I5022 Chronic systolic (congestive) heart failure: Secondary | ICD-10-CM | POA: Diagnosis not present

## 2020-07-30 DIAGNOSIS — E785 Hyperlipidemia, unspecified: Secondary | ICD-10-CM | POA: Diagnosis not present

## 2020-07-30 NOTE — Progress Notes (Signed)
Patient ID: PATTY LOPEZGARCIA                 DOB: 1941-03-13                    MRN: [id number]     HPI: AMY BELLOSO is a 80 y.o. male patient referred to lipid clinic by Dr Aundra Dubin. PMH is significant for a fib, CHF, LBBB, CKD, HTN, and HLD. Patient has a history of multiple intolerances to lipid lowering therapies.  Patient presents today in good spirits.  Has discontinued Jardiance due to weakness and will have BMP updated today.  Reports he could not pull his trash cans up and down his driveway but is now regaining his strength.  Has previously tried rosuvastatin and atorvastatin and both caused severe muscle pain and weakness.  He reports he could not move.  Repatha also caused leg weakness and was trialed on Nexlizet.  He self discontinued Nexlizet due to leg pain.  Was told about Leqvio from [name] and is interested in learning more information.  Is concerned about price and side effects.  Current Medications: n/a  Intolerances: rosuvastatin 5mg  daily (myalgias), atorvastatin 10mg  and 40mg  daily (severe fatigue), Repatha (leg pain), Nexlizet (leg pain)  Risk Factors: calcium score of 686 (64th percentile for age and sex), 40-59% left ICA stenosis, family history of ASCVD  LDL goal: <70  Labs: LDL 118, TC 190, HDL 55, Trigs 94 (02/02/20, not on any medications)  Past Medical History:  Diagnosis Date  . Arthritis   . Biatrial enlargement    severe  . Bicuspid aortic valve   . CHF (congestive heart failure) ([facility])    JULY 2014  . Chronic renal insufficiency    not aware  . GERD (gastroesophageal reflux disease)   . History of cardioversion 12/16/2012  . History of stomach ulcers   . Hypertension   . Left bundle branch block   . Nonischemic cardiomyopathy ([facility])       . Persistent atrial fibrillation ([facility])    multiple prior cardioversion  . Sinus bradycardia   . Status post clamping of cerebral aneurysm 80    Current Outpatient Medications on File Prior to Visit   Medication Sig Dispense Refill  . acetaminophen (TYLENOL) 500 MG tablet Take 1,000 mg by mouth every 8 (eight) hours as needed for moderate pain or headache.    . carvedilol (COREG) 25 MG tablet TAKE 1 TABLET BY MOUTH TWICE A DAY WITH A MEAL 180 tablet 3  . Coenzyme Q10 200 MG capsule Take 1 capsule (200 mg total) by mouth daily. 90 capsule 3  . empagliflozin (JARDIANCE) 10 MG TABS tablet Take 1 tablet (10 mg total) by mouth daily before breakfast. 30 tablet 11  . ENTRESTO 97-103 MG TAKE 1 TABLET BY MOUTH TWICE DAILY 60 tablet 6  . finasteride (PROPECIA) 1 MG tablet TAKE 1 TABLET BY MOUTH EVERY DAY 90 tablet 3  . fluticasone (FLONASE) 50 MCG/ACT nasal spray Place 2 sprays into both nostrils daily. 16 g 3  . Multiple Vitamins-Minerals (PRESERVISION AREDS 2+MULTI VIT PO) Take 1 tablet by mouth in the morning and at bedtime.    . mupirocin ointment (BACTROBAN) 2 % APPLY EXTERNALLY TO FACE EVERY DAY AS NEEDED    . pantoprazole (PROTONIX) 40 MG tablet Take 1 tablet (40 mg total) by mouth 2 (two) times daily. 180 tablet 0  . patiromer (VELTASSA) 8.4 g packet Take 1 packet (8.4 g total) by mouth  daily. 30 packet 5  . Rivaroxaban (XARELTO) 15 MG TABS tablet TAKE 1 TABLET BY MOUTH DAILY WITH SUPPER 90 tablet 3  . spironolactone (ALDACTONE) 25 MG tablet Take 1 tablet (25 mg total) by mouth daily. 90 tablet 3  . temazepam (RESTORIL) 15 MG capsule TAKE 1 CAPSULE(15 MG) BY MOUTH AT BEDTIME AS NEEDED FOR SLEEP 30 capsule 5  . torsemide (DEMADEX) 20 MG tablet Take 1 tablet (20 mg total) by mouth daily. 90 tablet 3  . traMADol (ULTRAM) 50 MG tablet TAKE 1 TABLET BY MOUTH THREE TIMES DAILY AS NEEDED FOR MODERATE OR SEVERE ARTHRITIS PAIN IN HANDS AND NECK 90 tablet 1   No current facility-administered medications on file prior to visit.    Allergies  Allergen Reactions  . Atorvastatin Other (See Comments)    Severe fatigue on 10mg  and 40mg  dosing  . Nexlizet [Bempedoic Acid-Ezetimibe]     myalgias  .  Repatha [Evolocumab] Other (See Comments)    Leg weakness  . Rosuvastatin Other (See Comments)    myalgias on 5mg  daily    Assessment/Plan:  1. Hyperlipidemia - Patient most recent LDL 118 which is above goal of <70. Patient has tried and unable to tolerate most lipid lowering therapies.    Discussed Leqvio with patient.  Advised that the half life if the medication Is very short which reduces the incidence of side effects.  Could not provide a price for patient, however he has Medicare and a Part F supplement which should considerably reduce the price.  Will complete paperwork and send off to service center.  Will follow up with patient when I hear back through service center portal.  Printed patient information paperwork including counseling administration.    Karren Cobble, PharmD, BCACP, [place], [place] [address] [place], [place] [address] Phone: [phone]; Fax: [phone] 07/30/2020 3:52 PM

## 2020-07-30 NOTE — Patient Instructions (Addendum)
It was nice meeting you today!  We would like your LDL (bad cholesterol) to be less than 70  We will pursue the paperwork for Leqvio and we will be in touch with the next steps if you want to proceed  Please call us with any questions!  Karren Cobble, PharmD, BCACP, [place], [place] [address], [place], [place] [address] Phone: [phone]; Fax: [phone] 07/30/2020 3:14 PM

## 2020-07-31 LAB — BASIC METABOLIC PANEL
BUN/Creatinine Ratio: 24 (ref 10–24)
BUN: 43 mg/dL — ABNORMAL HIGH (ref 8–27)
CO2: 16 mmol/L — ABNORMAL LOW (ref 20–29)
Calcium: 8.7 mg/dL (ref 8.6–10.2)
Chloride: 100 mmol/L (ref 96–106)
Creatinine, Ser: 1.78 mg/dL — ABNORMAL HIGH (ref 0.76–1.27)
Glucose: 130 mg/dL — ABNORMAL HIGH (ref 65–99)
Potassium: 4.7 mmol/L (ref 3.5–5.2)
Sodium: 137 mmol/L (ref 134–144)
eGFR: 38 mL/min/{1.73_m2} — ABNORMAL LOW (ref >59)

## 2020-08-01 NOTE — Progress Notes (Signed)
Remote pacemaker transmission.   

## 2020-08-02 DIAGNOSIS — L821 Other seborrheic keratosis: Secondary | ICD-10-CM | POA: Diagnosis not present

## 2020-08-02 DIAGNOSIS — L812 Freckles: Secondary | ICD-10-CM | POA: Diagnosis not present

## 2020-08-02 DIAGNOSIS — D225 Melanocytic nevi of trunk: Secondary | ICD-10-CM | POA: Diagnosis not present

## 2020-08-02 DIAGNOSIS — L570 Actinic keratosis: Secondary | ICD-10-CM | POA: Diagnosis not present

## 2020-08-02 DIAGNOSIS — D2262 Melanocytic nevi of left upper limb, including shoulder: Secondary | ICD-10-CM | POA: Diagnosis not present

## 2020-08-02 DIAGNOSIS — D2261 Melanocytic nevi of right upper limb, including shoulder: Secondary | ICD-10-CM | POA: Diagnosis not present

## 2020-08-08 ENCOUNTER — Telehealth: Payer: Self-pay | Admitting: Pharmacist

## 2020-08-08 DIAGNOSIS — E785 Hyperlipidemia, unspecified: Secondary | ICD-10-CM

## 2020-08-08 NOTE — Telephone Encounter (Signed)
Patient approved for Leqvio with no prior authorization. Will have to pay 233 dollar deductible but then medication should be fully covered. Called patient and left message on machine to call back to discuss

## 2020-08-09 NOTE — Telephone Encounter (Signed)
Update: patient's medicare supplement should cover deductible and he should have no copay.  Additional Comments: The patient has a primary and secondary insurance policy. The patient has primary insurance through Medicare Part B, which will cover LEQVIO at 80% after the patient meets the $233.00 calendar year deductible. Once the deductible is met, the patient will be responsible for a 20% coinsurance. As of 07/31/20, the patient has met $0.00 towards their deductible. The patient has supplemental insurance coverage through [facility] under [employer] Supplement Plan F, which will cover the Part B coinsurance and deductible. A Prior Authorization (PA) is not required. Coordination of benefits is on file. Provider must [place] or refer to an [facility]). For [facility] treatment site options, please refer to [email or web address]. Specialty Pharmacy acquisition is not available through Medicare Part B.

## 2020-08-09 NOTE — Telephone Encounter (Signed)
Pt returned call to clinic. First Leqvio injection has been scheduled at infusion center at [facility] on 3/24 at 12:45pm. Pt aware to enter [facility] through the main entrance and check in at the registration desk where they will direct him to his appt.

## 2020-08-10 NOTE — Telephone Encounter (Signed)
Sent email to pharmacy purchasing who will place order for [facility]

## 2020-08-15 ENCOUNTER — Telehealth: Payer: Self-pay | Admitting: Pharmacist

## 2020-08-15 NOTE — Telephone Encounter (Signed)
Received message from admin that [name] inpatient build/billing is not correct. Pt was scheduled for first Leqvio injection tomorrow, this has been canceled. Pt aware I will call to reschedule once billing issue is sorted out.

## 2020-08-16 ENCOUNTER — Inpatient Hospital Stay (HOSPITAL_COMMUNITY)
Admission: RE | Admit: 2020-08-16 | Discharge: 2020-08-16 | Disposition: A | Discharge status: Home / Self Care | Payer: Medicare Other | Source: Ambulatory Visit | Attending: Cardiology | Admitting: Cardiology

## 2020-08-17 ENCOUNTER — Other Ambulatory Visit: Payer: Self-pay | Admitting: Family Medicine

## 2020-08-29 ENCOUNTER — Telehealth: Payer: Self-pay

## 2020-08-29 DIAGNOSIS — I4729 Other ventricular tachycardia: Secondary | ICD-10-CM

## 2020-08-29 DIAGNOSIS — I5022 Chronic systolic (congestive) heart failure: Secondary | ICD-10-CM

## 2020-08-29 DIAGNOSIS — I472 Ventricular tachycardia: Secondary | ICD-10-CM

## 2020-08-29 NOTE — Telephone Encounter (Signed)
Carelink alert received 08/29/20. "Alert transmission for 3 VT events appears true VT - longest 32 beats @ rates 170's (noted waveform suspended for < 10 seconds)." Unsuccessful telephone encounter to patient to assess for symptoms and medication compliance. Hipaa compliant VM message left requesting call back to [phone]. Routed to device pool for follow up.   Medications included on medlist: Coreg 25mg  BID Entresto 97-103 BID Patiromer 1 pkt daily Xarelto 15mg  daily Aldactone 25mg  daily

## 2020-08-29 NOTE — Telephone Encounter (Signed)
The patient was returning nurse call. The nurse states she was going to call him back.

## 2020-08-29 NOTE — Telephone Encounter (Signed)
Successful telephone encounter with patient to discuss carelink alert transmission for NSVT events. Longest 32 beats and most recent today at approx 8:00 am for 16 beats. Patient states he was asymptomatic and sleeping at the time. Denies HF/NSVT symptoms. OptiVol below baseline. Patient has history of NSVT per previous carelink reports. Compliant with medications including Coreg, Entresto, Patiromer, Xarelto, Aldactone, and per patient Torsemide prescribed by [facility]. Patient provided device clinic phone number and encouraged to call if arrhythmia symptoms develop. Patient verbalized understanding and appreciative of call. Will forward to [name] for review.

## 2020-08-30 ENCOUNTER — Other Ambulatory Visit: Payer: Self-pay

## 2020-08-30 ENCOUNTER — Ambulatory Visit (INDEPENDENT_AMBULATORY_CARE_PROVIDER_SITE_OTHER): Payer: Medicare Other

## 2020-08-30 VITALS — BP 104/74 | HR 83 | Temp 97.2°F | Wt 167.6 lb

## 2020-08-30 DIAGNOSIS — Z0000 Encounter for general adult medical examination without abnormal findings: Secondary | ICD-10-CM

## 2020-08-30 NOTE — Patient Instructions (Addendum)
Travis [name] , Thank you for taking time to come for your Medicare Wellness Visit. I appreciate your ongoing commitment to your health goals. Please review the following plan we discussed and let me know if I can assist you in the future.   Screening recommendations/referrals: Colonoscopy: no longer required  Recommended yearly ophthalmology/optometry visit for glaucoma screening and checkup Recommended yearly dental visit for hygiene and checkup  Vaccinations: Influenza vaccine: Up to date Pneumococcal vaccine: Up to date Tdap vaccine: Due and discussed  Shingles vaccine: Completed 09/22/16 & 11/22/16   Covid-19: Completed 2/21, 3/17, & 02/21/20  Advanced directives: Please bring a copy of your health care power of attorney and living will to the office at your convenience.  Conditions/risks identified: Stay healthy  Next appointment: Follow up in one year for your annual wellness visit.   Preventive Care 81 Years and Older, Male Preventive care refers to lifestyle choices and visits with your health care provider that can promote health and wellness. What does preventive care include?  A yearly physical exam. This is also called an annual well check.  Dental exams once or twice a year.  Routine eye exams. Ask your health care provider how often you should have your eyes checked.  Personal lifestyle choices, including:  Daily care of your teeth and gums.  Regular physical activity.  Eating a healthy diet.  Avoiding tobacco and drug use.  Limiting alcohol use.  Practicing safe sex.  Taking low doses of aspirin every day.  Taking vitamin and mineral supplements as recommended by your health care provider. What happens during an annual well check? The services and screenings done by your health care provider during your annual well check will depend on your age, overall health, lifestyle risk factors, and family history of disease. Counseling  Your health care provider may  ask you questions about your:  Alcohol use.  Tobacco use.  Drug use.  Emotional well-being.  Home and relationship well-being.  Sexual activity.  Eating habits.  History of falls.  Memory and ability to understand (cognition).  Work and work Statistician. Screening  You may have the following tests or measurements:  Height, weight, and BMI.  Blood pressure.  Lipid and cholesterol levels. These may be checked every 5 years, or more frequently if you are over 55 years old.  Skin check.  Lung cancer screening. You may have this screening every year starting at age 58 if you have a 30-pack-year history of smoking and currently smoke or have quit within the past 15 years.  Fecal occult blood test (FOBT) of the stool. You may have this test every year starting at age 55.  Flexible sigmoidoscopy or colonoscopy. You may have a sigmoidoscopy every 5 years or a colonoscopy every 10 years starting at age 55.  Prostate cancer screening. Recommendations will vary depending on your family history and other risks.  Hepatitis C blood test.  Hepatitis B blood test.  Sexually transmitted disease (STD) testing.  Diabetes screening. This is done by checking your blood sugar (glucose) after you have not eaten for a while (fasting). You may have this done every 1-3 years.  Abdominal aortic aneurysm (AAA) screening. You may need this if you are a current or former smoker.  Osteoporosis. You may be screened starting at age 53 if you are at high risk. Talk with your health care provider about your test results, treatment options, and if necessary, the need for more tests. Vaccines  Your health care provider may  recommend certain vaccines, such as:  Influenza vaccine. This is recommended every year.  Tetanus, diphtheria, and acellular pertussis (Tdap, Td) vaccine. You may need a Td booster every 10 years.  Zoster vaccine. You may need this after age 13.  Pneumococcal 13-valent  conjugate (PCV13) vaccine. One dose is recommended after age 47.  Pneumococcal polysaccharide (PPSV23) vaccine. One dose is recommended after age 22. Talk to your health care provider about which screenings and vaccines you need and how often you need them. This information is not intended to replace advice given to you by your health care provider. Make sure you discuss any questions you have with your health care provider. Document Released: 06/08/2015 Document Revised: 01/30/2016 Document Reviewed: 03/13/2015 Elsevier Interactive Patient Education  2017 [place] Prevention in the Home Falls can cause injuries. They can happen to people of all ages. There are many things you can do to make your home safe and to help prevent falls. What can I do on the outside of my home?  Regularly fix the edges of walkways and driveways and fix any cracks.  Remove anything that might make you trip as you walk through a door, such as a raised step or threshold.  Trim any bushes or trees on the path to your home.  Use bright outdoor lighting.  Clear any walking paths of anything that might make someone trip, such as rocks or tools.  Regularly check to see if handrails are loose or broken. Make sure that both sides of any steps have handrails.  Any raised decks and porches should have guardrails on the edges.  Have any leaves, snow, or ice cleared regularly.  Use sand or salt on walking paths during winter.  Clean up any spills in your garage right away. This includes oil or grease spills. What can I do in the bathroom?  Use night lights.  Install grab bars by the toilet and in the tub and shower. Do not use towel bars as grab bars.  Use non-skid mats or decals in the tub or shower.  If you need to sit down in the shower, use a plastic, non-slip stool.  Keep the floor dry. Clean up any water that spills on the floor as soon as it happens.  Remove soap buildup in the tub or  shower regularly.  Attach bath mats securely with double-sided non-slip rug tape.  Do not have throw rugs and other things on the floor that can make you trip. What can I do in the bedroom?  Use night lights.  Make sure that you have a light by your bed that is easy to reach.  Do not use any sheets or blankets that are too big for your bed. They should not hang down onto the floor.  Have a firm chair that has side arms. You can use this for support while you get dressed.  Do not have throw rugs and other things on the floor that can make you trip. What can I do in the kitchen?  Clean up any spills right away.  Avoid walking on wet floors.  Keep items that you use a lot in easy-to-reach places.  If you need to reach something above you, use a strong step stool that has a grab bar.  Keep electrical cords out of the way.  Do not use floor polish or wax that makes floors slippery. If you must use wax, use non-skid floor wax.  Do not have throw rugs and  other things on the floor that can make you trip. What can I do with my stairs?  Do not leave any items on the stairs.  Make sure that there are handrails on both sides of the stairs and use them. Fix handrails that are broken or loose. Make sure that handrails are as long as the stairways.  Check any carpeting to make sure that it is firmly attached to the stairs. Fix any carpet that is loose or worn.  Avoid having throw rugs at the top or bottom of the stairs. If you do have throw rugs, attach them to the floor with carpet tape.  Make sure that you have a light switch at the top of the stairs and the bottom of the stairs. If you do not have them, ask someone to add them for you. What else can I do to help prevent falls?  Wear shoes that:  Do not have high heels.  Have rubber bottoms.  Are comfortable and fit you well.  Are closed at the toe. Do not wear sandals.  If you use a stepladder:  Make sure that it is fully  opened. Do not climb a closed stepladder.  Make sure that both sides of the stepladder are locked into place.  Ask someone to hold it for you, if possible.  Clearly mark and make sure that you can see:  Any grab bars or handrails.  First and last steps.  Where the edge of each step is.  Use tools that help you move around (mobility aids) if they are needed. These include:  Canes.  Walkers.  Scooters.  Crutches.  Turn on the lights when you go into a dark area. Replace any light bulbs as soon as they burn out.  Set up your furniture so you have a clear path. Avoid moving your furniture around.  If any of your floors are uneven, fix them.  If there are any pets around you, be aware of where they are.  Review your medicines with your doctor. Some medicines can make you feel dizzy. This can increase your chance of falling. Ask your doctor what other things that you can do to help prevent falls. This information is not intended to replace advice given to you by your health care provider. Make sure you discuss any questions you have with your health care provider. Document Released: 03/08/2009 Document Revised: 10/18/2015 Document Reviewed: 06/16/2014 Elsevier Interactive Patient Education  2017 [employer].

## 2020-08-30 NOTE — Progress Notes (Signed)
Subjective:   Travis [name] is a 80 y.o. male who presents for Medicare Annual/Subsequent preventive examination.  Review of Systems     Cardiac Risk Factors include: advanced age (>60men, >57 women);dyslipidemia;male gender;hypertension     Objective:    Today's Vitals   08/30/20 1109  BP: 104/74  Pulse: 83  Temp: (!) 97.2 F (36.2 C)  SpO2: 99%  Weight: 167 lb 9.6 oz (76 kg)   Body mass index is 25.48 kg/m.  Advanced Directives 08/30/2020 02/18/2019 02/07/2019 01/24/2019 11/22/2018 07/05/2018 06/22/2018  Does Patient Have a Medical Advance Directive? Yes Yes Yes Yes Yes Yes Yes  Type of Paramedic of [place];Living will Healthcare Power of [place];Living will [place];Living will [place];Living will  Does patient want to make changes to medical advance directive? - No - Patient declined No - Patient declined No - Patient declined No - Patient declined - No - Patient declined  Copy of [place] in Chart? No - copy requested No - copy requested Yes - validated most recent copy scanned in chart (See row information) Yes - validated most recent copy scanned in chart (See row information) Yes - validated most recent copy scanned in chart (See row information) - Yes - validated most recent copy scanned in chart (See row information)  Would patient like information on creating a medical advance directive? - - - - - - -  Pre-existing out of facility DNR order (yellow form or pink MOST form) - - - - - - -    Current Medications (verified) Outpatient Encounter Medications as of 08/30/2020  Medication Sig  . acetaminophen (TYLENOL) 500 MG tablet Take 1,000 mg by mouth every 8 (eight) hours as needed for moderate pain or headache.  . carvedilol (COREG) 25 MG tablet TAKE 1 TABLET BY MOUTH TWICE A DAY WITH A MEAL  . Coenzyme Q10  200 MG capsule Take 1 capsule (200 mg total) by mouth daily.  Marland Kitchen ENTRESTO 97-103 MG TAKE 1 TABLET BY MOUTH TWICE DAILY  . finasteride (PROPECIA) 1 MG tablet TAKE 1 TABLET BY MOUTH EVERY DAY  . fluticasone (FLONASE) 50 MCG/ACT nasal spray Place 2 sprays into both nostrils daily.  . Multiple Vitamins-Minerals (PRESERVISION AREDS 2+MULTI VIT PO) Take 1 tablet by mouth in the morning and at bedtime.  . mupirocin ointment (BACTROBAN) 2 % APPLY EXTERNALLY TO FACE EVERY DAY AS NEEDED  . pantoprazole (PROTONIX) 40 MG tablet Take 1 tablet (40 mg total) by mouth 2 (two) times daily.  . patiromer (VELTASSA) 8.4 g packet Take 1 packet (8.4 g total) by mouth daily.  . Rivaroxaban (XARELTO) 15 MG TABS tablet TAKE 1 TABLET BY MOUTH DAILY WITH SUPPER  . spironolactone (ALDACTONE) 25 MG tablet Take 1 tablet (25 mg total) by mouth daily.  . temazepam (RESTORIL) 15 MG capsule TAKE 1 CAPSULE(15 MG) BY MOUTH AT BEDTIME AS NEEDED FOR SLEEP  . torsemide (DEMADEX) 20 MG tablet Take 1 tablet (20 mg total) by mouth daily.  . traMADol (ULTRAM) 50 MG tablet TAKE 1 TABLET BY MOUTH THREE TIMES DAILY AS NEEDED FOR MODERATE OR SEVERE ARTHRITIS PAIN IN HANDS AND NECK  . inclisiran (LEQVIO) 284 MG/1.5ML SOSY injection Inject 284 mg into the skin once. Inject at 0 months, 3 months and then every 6 months (Patient not taking: Reported on 08/30/2020)  . [DISCONTINUED] empagliflozin (JARDIANCE) 10 MG TABS  tablet Take 1 tablet (10 mg total) by mouth daily before breakfast. (Patient not taking: No sig reported)   No facility-administered encounter medications on file as of 08/30/2020.    Allergies (verified) Atorvastatin, Nexlizet [bempedoic acid-ezetimibe], Repatha [evolocumab], and Rosuvastatin   History: Past Medical History:  Diagnosis Date  . Arthritis   . Biatrial enlargement    severe  . Bicuspid aortic valve   . CHF (congestive heart failure) ([place])    JULY 2014  . Chronic renal insufficiency    not aware  . GERD  (gastroesophageal reflux disease)   . History of cardioversion 12/16/2012  . History of stomach ulcers   . Hypertension   . Left bundle branch block   . Nonischemic cardiomyopathy ([place])       . Persistent atrial fibrillation ([place])    multiple prior cardioversion  . Sinus bradycardia   . Status post clamping of cerebral aneurysm 79   Past Surgical History:  Procedure Laterality Date  . ATRIAL FIBRILLATION ABLATION N/A 10/06/2017   Procedure: ATRIAL FIBRILLATION ABLATION;  Surgeon: [name], MD;  Location: [place] [facility];  Service: Cardiovascular;  Laterality: N/A;  . AV NODE ABLATION N/A 02/18/2019   Procedure: AV NODE ABLATION;  Surgeon: Deboraha Sprang, MD;  Location: [place] [facility];  Service: Cardiovascular;  Laterality: N/A;  . BIOPSY  02/06/2018   Procedure: BIOPSY;  Surgeon: Lavena Bullion, DO;  Location: [place] ENDOSCOPY;  Service: Gastroenterology;;  . BIOPSY  02/28/2018   Procedure: BIOPSY;  Surgeon: Mauri Pole, MD;  Location: [place] ENDOSCOPY;  Service: Endoscopy;;  . BIV PACEMAKER INSERTION CRT-P N/A 01/24/2019   Procedure: BIV PACEMAKER INSERTION CRT-P;  Surgeon: Deboraha Sprang, MD;  Location: [place] [facility];  Service: Cardiovascular;  Laterality: N/A;  . CARDIAC CATHETERIZATION     not awaer of this  . CARDIOVERSION N/A 12/16/2012   Procedure: CARDIOVERSION;  Surgeon: Lelon Perla, MD;  Location: [facility] ENDOSCOPY;  Service: Cardiovascular;  Laterality: N/A;  . CARDIOVERSION N/A 08/14/2017   Procedure: CARDIOVERSION;  Surgeon: Josue Hector, MD;  Location: [facility] ENDOSCOPY;  Service: Cardiovascular;  Laterality: N/A;  . CARDIOVERSION N/A 09/21/2017   Procedure: CARDIOVERSION;  Surgeon: Sanda Klein, MD;  Location: [place] ENDOSCOPY;  Service: Cardiovascular;  Laterality: N/A;  . CARDIOVERSION N/A 10/13/2017   Procedure: CARDIOVERSION;  Surgeon: Josue Hector, MD;  Location: [facility] ENDOSCOPY;  Service: Cardiovascular;  Laterality: N/A;  . CARDIOVERSION N/A 07/05/2018    Procedure: CARDIOVERSION;  Surgeon: Larey Dresser, MD;  Location: [facility] ENDOSCOPY;  Service: Cardiovascular;  Laterality: N/A;  . CATARACT EXTRACTION  05/2015   bilateral  . cerebral anuersym post clips    . ESOPHAGOGASTRODUODENOSCOPY N/A 02/06/2018   Procedure: ESOPHAGOGASTRODUODENOSCOPY (EGD);  Surgeon: Lavena Bullion, DO;  Location: [facility] ENDOSCOPY;  Service: Gastroenterology;  Laterality: N/A;  . ESOPHAGOGASTRODUODENOSCOPY N/A 02/28/2018   Procedure: ESOPHAGOGASTRODUODENOSCOPY (EGD);  Surgeon: Mauri Pole, MD;  Location[facility] END[facility];  Service: Endoscopy;  Laterality: N/A;  . fractured left arm    . HERNIA REPAIR     lft  . INGUINAL HERNIA REPAIR  07/02/2011   Procedure: LAPAROSCOPIC INGUINAL HERNIA;  Surgeon: Harl Bowie, MD;  Location: [place];  Service: General;  Laterality: Left;  Laparoscopic left inguinal hernia repair and mesh  . INGUINAL HERNIA REPAIR Left 11/24/2018   Procedure: OPEN LEFT INGUINAL HERNIA REPAIR WITH MESH;  Surgeon: Coralie Keens, MD;  Location: [place];  Service: General;  Laterality: Left;  TAP BLOCK  .  left tendon repair     lft foot  . ROTATOR CUFF REPAIR     lf  . TEE WITHOUT CARDIOVERSION N/A 08/14/2017   Procedure: TRANSESOPHAGEAL ECHOCARDIOGRAM (TEE);  Surgeon: Josue Hector, MD;  Location: [facility] ENDOSCOPY;  Service: Cardiovascular;  Laterality: N/A;  . TEE WITHOUT CARDIOVERSION N/A 07/05/2018   Procedure: TRANSESOPHAGEAL ECHOCARDIOGRAM (TEE);  Surgeon: Larey Dresser, MD;  Location: [facility] ENDOSCOPY;  Service: Cardiovascular;  Laterality: N/A;  . TONSILLECTOMY    . TOTAL KNEE ARTHROPLASTY Bilateral 02/06/2014   Procedure: TOTAL KNEE BILATERAL;  Surgeon: Mauri Pole, MD;  Location: [facility];  Service: Orthopedics;  Laterality: Bilateral;  . WRIST GANGLION EXCISION     lft   Family History  Problem Relation Age of Onset  . Stroke Father 21       smoker  . Lung cancer Mother 92       former smoker  . Stroke Mother   . Stroke Brother   .  Colon cancer Neg Hx    Social History   Socioeconomic History  . Marital status: Single    Spouse name: Not on file  . Number of children: 0  . Years of education: Not on file  . Highest education level: Not on file  Occupational History  . Occupation: Retired  Tobacco Use  . Smoking status: Former Smoker    Packs/day: 1.50    Years: 25.00    Pack years: 62.50    Quit date: 05/26/1986    Years since quitting: 34.2  . Smokeless tobacco: Never Used  Vaping Use  . Vaping Use: Never used  Substance and Sexual Activity  . Alcohol use: Not Currently    Alcohol/week: 1.0 standard drink    Types: 1 Standard drinks or equivalent per week    Comment: stopped drinking   . Drug use: No  . Sexual activity: Not Currently  Other Topics Concern  . Not on file  Social History Narrative   Homosexual. Lives alone. Not sexually active.       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 Difficulty of Paying Living Expenses: Not hard at all  Food Insecurity: No Food Insecurity  . Worried About Charity fundraiser in the Last Year: Never true  . Ran Out of Food in the Last Year: Never true  Transportation Needs: No Transportation Needs  . Lack of Transportation (Medical): No  . Lack of Transportation (Non-Medical): No  Physical Activity: Insufficiently Active  . Days of Exercise per Week: 2 days  . Minutes of Exercise per Session: 50 min  Stress: No Stress Concern Present  . Feeling of Stress : Not at all  Social Connections: Socially Isolated  . Frequency of Communication with Friends and Family: More than three times a week  . Frequency of Social Gatherings with Friends and Family: More than three times a week  . Attends Religious Services: Never  . Active Member of Clubs or Organizations: No  . Attends Theatre manager Meetings: Never  . Marital Status: Never married    Tobacco Counseling Counseling given: Not Answered   Clinical Intake:  Pre-visit preparation completed: Yes  Pain : No/denies pain     BMI - recorded: 25.48 Nutritional Status: BMI 25 -29 Overweight Nutritional Risks: None Diabetes: No  How often do you need to have someone help you when you read instructions, pamphlets, or other written materials from your doctor or pharmacy?: 1 - Never  Diabetic?No  Interpreter Needed?: No  Information entered by :: Charlott Rakes <lpn   Activities of Daily Living In your present state of health, do you have any difficulty performing the following activities: 08/30/2020 03/15/2020  Hearing? N N  Vision? N N  Difficulty concentrating or making decisions? N N  Walking or climbing stairs? N N  Dressing or bathing? N N  Doing errands, shopping? N N  Preparing Food and eating ? N -  Using the Toilet? N -  In the past six months, have you accidently leaked urine? N -  Do you have problems with loss of bowel control? N -  Managing your Medications? N -  Managing your Finances? N -  Housekeeping or managing your Housekeeping? N -  Some recent data might be hidden    Patient Care Team: [name], MD as PCP - General (Family Medicine) Larey Dresser, MD as PCP - Advanced Heart Failure (Cardiology) Larey Dresser, MD as Consulting Physician (Cardiology) Milus Banister, MD as Attending Physician (Gastroenterology) Jarome Matin, MD as Consulting Physician (Dermatology) Rutherford Guys, MD as Consulting Physician (Ophthalmology) Paralee Cancel, MD as Consulting Physician (Orthopedic Surgery) Joycie Peek, MD (General Practice) Madelin Rear, [facility] (Pharmacist) Madelin Rear, [facility] as Pharmacist (Pharmacist)  Indicate any recent Medical Services you may have received from other than [facility] providers in the past year (date may be approximate).     Assessment:   This  is a routine wellness examination for Shigeru.  Hearing/Vision screen  Hearing Screening   125Hz  250Hz  500Hz  1000Hz  2000Hz  3000Hz  4000Hz  6000Hz  8000Hz   Right ear:           Left ear:           Comments: Pt denies any hearing issues   Vision Screening Comments: Pt follows up with Dr Rutherford Guys for annual eye exams   Dietary issues and exercise activities discussed: Current Exercise Habits: Home exercise routine, Type of exercise: Other - see comments (gym), Time (Minutes): 45, Frequency (Times/Week): 2, Weekly Exercise (Minutes/Week): 90  Goals    . patient     Will maintain his health      . Patient Stated     Stay healthy    . PharmD Care Plan     CARE PLAN ENTRY (see longitudinal plan of care for additional care plan information)  Current Barriers:  . Chronic Disease Management support, education, and care coordination needs related to Hypertension, Hyperlipidemia, and Atrial Fibrillation   Hypertension BP Readings from Last 3 Encounters:  04/27/20 92/70  03/15/20 122/66  03/14/20 102/60   . Pharmacist Clinical Goal(s): o Over the next 365 days, patient will work with PharmD and providers to maintain BP goal <130/80 . Current regimen:  . Entresto 97-103 mg twice daily (heart failure, blood pressure) . Spironolactone 25 mg once daily (heart failure, blood pressure) . Carvedilol 25 mg twice daily (heart failure, blood pressure, heart rate control) . Interventions: o Reviewed patient assistance programs for Entresto - declined at this time. o Discussed exercise/diet - Maintain a healthy weight and exercise regularly, as directed by your health care provider. Eat healthy foods, such as: Lean proteins, complex carbohydrates, fresh fruits and vegetables, low-fat dairy products, healthy fats. . Patient self care activities - Over the next 365 days, patient will: o Check BP several  times per month at home, document, and provide at future appointments o Ensure daily salt intake  < 2300 mg/day  Hyperlipidemia Lab Results  Component Value Date/Time   LDLCALC 118 (H) 02/02/2020 12:02 PM   LDLDIRECT 84 12/06/2019 10:58 AM   LDLDIRECT 74.0 07/31/2016 10:12 AM   . Pharmacist Clinical Goal(s): o Over the next 180 days, patient will work with PharmD and providers to achieve LDL goal < 70 . Current regimen:  o No current medications . Interventions: o We discussed remaining alternatives for cholesterol medications - no previous trial Praluent injection, open to trying next . Patient self care activities - Over the next 180 days, patient will: o At cardiology follow-up: Discuss Praluent as next option to help lower LDL to goal <70  Stroke Prevention in Atrial Fibrillation . Pharmacist Clinical Goal(s) o Over the next 180 days, patient will work with PharmD and providers to ensure medication safety and accessibility . Current regimen:  o Xarelto 15 mg once daily with supper . Interventions: o Reviewed side effects - no problems noted o Reviewed patient assistance for Xarelto - declined at this time.  . Patient self care activities - Over the next 180 days, patient will: o Continue current management  Medication management . Pharmacist Clinical Goal(s): o Over the next 365 days, patient will work with PharmD and providers to maintain optimal medication adherence . Current pharmacy: [facility] . Interventions o Comprehensive medication review performed. o Will perform cost review comparing [place] to [place] - will follow-up if [facility] has lower overall cost  . Patient self care activities - Over the next 365 days, patient will: o Focus on medication adherence by taking medications as prescribed o Report any questions or concerns to PharmD and/or provider(s) Initial goal documentation.      Depression Screen PHQ 2/9 Scores 08/30/2020 03/15/2020 02/07/2019 02/04/2018 09/29/2017 02/03/2017 02/01/2016  PHQ - 2 Score 0 0 0 0 1 0 0   PHQ- 9 Score - - - - 4 - -    Fall Risk Fall Risk  08/30/2020 03/15/2020 02/07/2019 02/04/2018 02/03/2017  Falls in the past year? 0 0 0 No No  Comment - - - - -  Number falls in past yr: 0 0 0 - -  Injury with Fall? 0 0 0 - -  Risk for fall due to : Impaired vision - - - -  Follow up Falls prevention discussed - Falls evaluation completed;Education provided - -    FALL RISK PREVENTION PERTAINING TO THE HOME:  Any stairs in or around the home? No  If so, are there any without handrails? No  Home free of loose throw rugs in walkways, pet beds, electrical cords, etc? Yes  Adequate lighting in your home to reduce risk of falls? Yes   ASSISTIVE DEVICES UTILIZED TO PREVENT FALLS:  Life alert? No  Use of a cane, walker or w/c? No  Grab bars in the bathroom? Yes  Shower chair or bench in shower? Yes  Elevated toilet seat or a handicapped toilet? No   TIMED UP AND GO:  Was the test performed? Yes .  Length of time to ambulate 10 feet: 10 sec.   Gait steady and fast without use of assistive device  Cognitive Function: MMSE - Mini Mental State Exam 02/04/2018  Not completed: (No Data)     6CIT Screen 08/30/2020 02/07/2019  What Year? 0 points 0 points  What month? 0 points 0 points  What time? -  0 points  Count back from 20 0 points 0 points  Months in reverse 0 points 0 points  Repeat phrase 0 points 0 points  Total Score - 0    Immunizations Immunization History  Administered Date(s) Administered  . Fluad Quad(high Dose 65+) 02/07/2019, 03/15/2020  . Influenza Split 03/04/2011, 02/04/2012  . Influenza Whole 04/17/2004, 03/31/2007, 02/17/2008, 04/12/2009, 03/13/2010  . Influenza, High Dose Seasonal PF 03/17/2016, 02/03/2017, 02/04/2018  . Influenza,inj,Quad PF,6+ Mos 02/09/2013, 03/06/2014, 01/30/2015  . PFIZER(Purple Top)SARS-COV-2 Vaccination 07/17/2019, 08/10/2019, 02/21/2020  . Pneumococcal Conjugate-13 12/19/2014  . Pneumococcal Polysaccharide-23 04/12/2009  . Td  04/17/2004  . Zoster 09/25/2010  . Zoster Recombinat (Shingrix) 09/22/2016, 11/22/2016    TDAP status: Due, Education has been provided regarding the importance of this vaccine. Advised may receive this vaccine at local pharmacy or Health Dept. Aware to provide a copy of the vaccination record if obtained from local pharmacy or Health Dept. Verbalized acceptance and understanding.  Flu Vaccine status: Up to date  Pneumococcal vaccine status: Up to date  Covid-19 vaccine status: Completed vaccines  Qualifies for Shingles Vaccine? Yes   Zostavax completed Yes   Shingrix Completed?: Yes  Screening Tests Health Maintenance  Topic Date Due  . TETANUS/TDAP  04/17/2014  . INFLUENZA VACCINE  12/24/2020  . COVID-19 Vaccine  Completed  . PNA vac Low Risk Adult  Completed  . HPV VACCINES  Aged Out    Health Maintenance  Health Maintenance Due  Topic Date Due  . TETANUS/TDAP  04/17/2014    Colorectal cancer screening: No longer required.    Additional Screening:  Vision Screening: Recommended annual ophthalmology exams for early detection of glaucoma and other disorders of the eye. Is the patient up to date with their annual eye exam?  Yes  Who is the provider or what is the name of the office in which the patient attends annual eye exams? Dr Rutherford Guys  If pt is not established with a provider, would they like to be referred to a provider to establish care? No .   Dental Screening: Recommended annual dental exams for proper oral hygiene  Community Resource Referral / Chronic Care Management: CRR required this visit?  No   CCM required this visit?  No      Plan:     I have personally reviewed and noted the following in the patient's chart:   . Medical and social history . Use of alcohol, tobacco or illicit drugs  . Current medications and supplements . Functional ability and status . Nutritional status . Physical activity . Advanced directives . List of other  physicians . Hospitalizations, surgeries, and ER visits in previous 12 months . Vitals . Screenings to include cognitive, depression, and falls . Referrals and appointments  In addition, I have reviewed and discussed with patient certain preventive protocols, quality metrics, and best practice recommendations. A written personalized care plan for preventive services as well as general preventive health recommendations were provided to patient.     Willette Brace, LPN   02/01/8337   Nurse Notes: Pt stated that he has been very tired lately, doesn't want an appt at this time just wanted you to be aware

## 2020-09-01 NOTE — Telephone Encounter (Signed)
Travis [name],  has hx of VTNS-- although this  might represent an increase frequency as suggested by the decrease in BiVpacing percentage.  His dig was stopped a few weeks ago which could change the LV dynamics.  Lets just keep and eye, but also lets check a BMET and Mg   his GFR is about 30 +/-  Thanks SK

## 2020-09-03 DIAGNOSIS — Z23 Encounter for immunization: Secondary | ICD-10-CM | POA: Diagnosis not present

## 2020-09-06 NOTE — Telephone Encounter (Signed)
Spoke with pt and advised per Dr Caryl Comes pt should have BMET and Mg lab d/t recent episode of NSVT.  Pt verbalizes understanding.  Appointment scheduled for 09/07/2020 and lab orders placed.  Pt verbalizes understanding and agrees with current plan.

## 2020-09-07 ENCOUNTER — Other Ambulatory Visit: Payer: Medicare Other | Admitting: *Deleted

## 2020-09-07 ENCOUNTER — Other Ambulatory Visit: Payer: Self-pay

## 2020-09-07 DIAGNOSIS — I5022 Chronic systolic (congestive) heart failure: Secondary | ICD-10-CM | POA: Diagnosis not present

## 2020-09-07 DIAGNOSIS — I4729 Other ventricular tachycardia: Secondary | ICD-10-CM

## 2020-09-07 DIAGNOSIS — I472 Ventricular tachycardia: Secondary | ICD-10-CM | POA: Diagnosis not present

## 2020-09-08 LAB — BASIC METABOLIC PANEL
BUN/Creatinine Ratio: 24 (ref 10–24)
BUN: 45 mg/dL — ABNORMAL HIGH (ref 8–27)
CO2: 15 mmol/L — ABNORMAL LOW (ref 20–29)
Calcium: 8.6 mg/dL (ref 8.6–10.2)
Chloride: 102 mmol/L (ref 96–106)
Creatinine, Ser: 1.89 mg/dL — ABNORMAL HIGH (ref 0.76–1.27)
Glucose: 119 mg/dL — ABNORMAL HIGH (ref 65–99)
Potassium: 4.7 mmol/L (ref 3.5–5.2)
Sodium: 133 mmol/L — ABNORMAL LOW (ref 134–144)
eGFR: 35 mL/min/{1.73_m2} — ABNORMAL LOW (ref >59)

## 2020-09-08 LAB — MAGNESIUM: Magnesium: 1.8 mg/dL (ref 1.6–2.3)

## 2020-09-11 ENCOUNTER — Telehealth: Payer: Self-pay

## 2020-09-11 ENCOUNTER — Other Ambulatory Visit: Payer: Self-pay

## 2020-09-11 ENCOUNTER — Encounter: Payer: Self-pay | Admitting: Family

## 2020-09-11 ENCOUNTER — Ambulatory Visit (INDEPENDENT_AMBULATORY_CARE_PROVIDER_SITE_OTHER): Payer: Medicare Other | Admitting: Family

## 2020-09-11 ENCOUNTER — Ambulatory Visit (INDEPENDENT_AMBULATORY_CARE_PROVIDER_SITE_OTHER): Payer: Medicare Other

## 2020-09-11 VITALS — BP 102/67 | HR 81 | Temp 97.2°F | Ht 68.0 in | Wt 174.6 lb

## 2020-09-11 DIAGNOSIS — R0602 Shortness of breath: Secondary | ICD-10-CM | POA: Diagnosis not present

## 2020-09-11 DIAGNOSIS — R0600 Dyspnea, unspecified: Secondary | ICD-10-CM

## 2020-09-11 DIAGNOSIS — Z950 Presence of cardiac pacemaker: Secondary | ICD-10-CM | POA: Diagnosis not present

## 2020-09-11 NOTE — Progress Notes (Signed)
Acute Office Visit  Subjective:    Patient ID: Travis [name], male    DOB: 1941-03-13, 80 y.o.   MRN: [id number]  Chief Complaint  Patient presents with  . Shortness of Breath    Starting almost 2 weeks ago, on exertion. He has a pacemaker, and has not received any notification from Cardiology. He is not experiencing any pain, and seems to be felling a little better.    HPI Patient is in today with complaints of mild shortness of breath over the last 2 weeks.  He denies any chest pain, no nausea vomiting.  It has not prevented him from completing his activities of daily living.  He has a pacemaker and has not received any notification from cardiology.  Admits to receiving the COVID-19 booster vaccine approximately 2 weeks ago.  Reports today, he actually feels a little less short of breath than he had.  Patient has a history of congestive heart failure, atrial fibrillation.  Reports he has been stable.  Denies any cough, congestion, or fever.  Rapid COVID test have been negative.  Past Medical History:  Diagnosis Date  . Arthritis   . Biatrial enlargement    severe  . Bicuspid aortic valve   . CHF (congestive heart failure) ([place])    JULY 2014  . Chronic renal insufficiency    not aware  . GERD (gastroesophageal reflux disease)   . History of cardioversion 12/16/2012  . History of stomach ulcers   . Hypertension   . Left bundle branch block   . Nonischemic cardiomyopathy ([place])       . Persistent atrial fibrillation ([place])    multiple prior cardioversion  . Sinus bradycardia   . Status post clamping of cerebral aneurysm 79    Past Surgical History:  Procedure Laterality Date  . ATRIAL FIBRILLATION ABLATION N/A 10/06/2017   Procedure: ATRIAL FIBRILLATION ABLATION;  Surgeon: [name], MD;  Location: [place] [facility];  Service: Cardiovascular;  Laterality: N/A;  . AV NODE ABLATION N/A 02/18/2019   Procedure: AV NODE ABLATION;  Surgeon: Deboraha Sprang, MD;  Location: [place] [facility];  Service: Cardiovascular;  Laterality: N/A;  . BIOPSY  02/06/2018   Procedure: BIOPSY;  Surgeon: Lavena Bullion, DO;  Location: [place] [facility];  Service: Gastroenterology;;  . BIOPSY  02/28/2018   Procedure: BIOPSY;  Surgeon: Mauri Pole, MD;  Location: [place] [facility];  Service: Endoscopy;;  . BIV PACEMAKER INSERTION CRT-P N/A 01/24/2019   Procedure: BIV PACEMAKER INSERTION CRT-P;  Surgeon: Deboraha Sprang, MD;  Location: [place] [facility];  Service: Cardiovascular;  Laterality: N/A;  . CARDIAC CATHETERIZATION     not awaer of this  . CARDIOVERSION N/A 12/16/2012   Procedure: CARDIOVERSION;  Surgeon: Lelon Perla, MD;  Location: [facility] ENDOSCOPY;  Service: Cardiovascular;  Laterality: N/A;  . CARDIOVERSION N/A 08/14/2017   Procedure: CARDIOVERSION;  Surgeon: Josue Hector, MD;  Location: [facility] ENDOSCOPY;  Service: Cardiovascular;  Laterality: N/A;  . CARDIOVERSION N/A 09/21/2017   Procedure: CARDIOVERSION;  Surgeon: Sanda Klein, MD;  Location: [place] [facility]OSCOPY;  Service: Cardiovascular;  Laterality: N/A;  . CARDIOVERSION N/A 10/13/2017   Procedure: CARDIOVERSION;  Surgeon: Josue Hector, MD;  Location: [facility] END[facility];  Service: Cardiovascular;  Laterality: N/A;  . CARDIOVERSION N/A 07/05/2018   Procedure: CARDIOVERSION;  Surgeon: Larey Dresser, MD;  Location: [place];  Service: Cardiovascular;  Laterality: N/A;  . CATARACT EXTRACTION  05/2015   bilateral  . cerebral anuersym post  clips    . ESOPHAGOGASTRODUODENOSCOPY N/A 02/06/2018   Procedure: ESOPHAGOGASTRODUODENOSCOPY (EGD);  Surgeon: Lavena Bullion, DO;  Location: [facility] ENDOSCOPY;  Service: Gastroenterology;  Laterality: N/A;  . ESOPHAGOGASTRODUODENOSCOPY N/A 02/28/2018   Procedure: ESOPHAGOGASTRODUODENOSCOPY (EGD);  Surgeon: Mauri Pole, MD;  Location: [facility] ENDOSCOPY;  Service: Endoscopy;  Laterality: N/A;  . fractured left arm    . HERNIA REPAIR     lft  . INGUINAL HERNIA REPAIR  07/02/2011   Procedure:  LAPAROSCOPIC INGUINAL HERNIA;  Surgeon: Harl Bowie, MD;  Location: [place];  Service: General;  Laterality: Left;  Laparoscopic left inguinal hernia repair and mesh  . INGUINAL HERNIA REPAIR Left 11/24/2018   Procedure: OPEN LEFT INGUINAL HERNIA REPAIR WITH MESH;  Surgeon: Coralie Keens, MD;  Location: [place];  Service: General;  Laterality: Left;  TAP BLOCK  . left tendon repair     lft foot  . ROTATOR CUFF REPAIR     lf  . TEE WITHOUT CARDIOVERSION N/A 08/14/2017   Procedure: TRANSESOPHAGEAL ECHOCARDIOGRAM (TEE);  Surgeon: Josue Hector, MD;  Location: [facility] ENDOSCOPY;  Service: Cardiovascular;  Laterality: N/A;  . TEE WITHOUT CARDIOVERSION N/A 07/05/2018   Procedure: TRANSESOPHAGEAL ECHOCARDIOGRAM (TEE);  Surgeon: Larey Dresser, MD;  Location: [facility] ENDOSCOPY;  Service: Cardiovascular;  Laterality: N/A;  . TONSILLECTOMY    . TOTAL KNEE ARTHROPLASTY Bilateral 02/06/2014   Procedure: TOTAL KNEE BILATERAL;  Surgeon: Mauri Pole, MD;  Location: [facility];  Service: Orthopedics;  Laterality: Bilateral;  . WRIST GANGLION EXCISION     lft    Family History  Problem Relation Age of Onset  . Stroke Father 33       smoker  . Lung cancer Mother 53       former smoker  . Stroke Mother   . Stroke Brother   . Colon cancer Neg Hx     Social History   Socioeconomic History  . Marital status: Single    Spouse name: Not on file  . Number of children: 0  . Years of education: Not on file  . Highest education level: Not on file  Occupational History  . Occupation: Retired  Tobacco Use  . Smoking status: Former Smoker    Packs/day: 1.50    Years: 25.00    Pack years: 45.50    Quit date: 05/26/1986    Years since quitting: 34.3  . Smokeless tobacco: Never Used  Vaping Use  . Vaping Use: Never used  Substance and Sexual Activity  . Alcohol use: Not Currently    Alcohol/week: 1.0 standard drink    Types: 1 Standard drinks or equivalent per week    Comment: stopped drinking   . Drug  use: No  . Sexual activity: Not Currently  Other Topics Concern  . Not on file  Social History Narrative   Homosexual. Lives alone. Not sexually active.       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 Difficulty of Paying Living Expenses: Not hard at all  Food Insecurity: No Food Insecurity  . Worried About Charity fundraiser in the Last Year: Never true  . Ran Out of Food in the Last Year: Never true  Transportation Needs: No Transportation Needs  . Lack of Transportation (Medical): No  . Lack of Transportation (Non-Medical):  No  Physical Activity: Insufficiently Active  . Days of Exercise per Week: 2 days  . Minutes of Exercise per Session: 50 min  Stress: No Stress Concern Present  . Feeling of Stress : Not at all  Social Connections: Socially Isolated  . Frequency of Communication with Friends and Family: More than three times a week  . Frequency of Social Gatherings with Friends and Family: More than three times a week  . Attends Religious Services: Never  . Active Member of Clubs or Organizations: No  . Attends Archivist Meetings: Never  . Marital Status: Never married  Intimate Partner Violence: Not At Risk  . Fear of Current or Ex-Partner: No  . Emotionally Abused: No  . Physically Abused: No  . Sexually Abused: No    Outpatient Medications Prior to Visit  Medication Sig Dispense Refill  . acetaminophen (TYLENOL) 500 MG tablet Take 1,000 mg by mouth every 8 (eight) hours as needed for moderate pain or headache.    . carvedilol (COREG) 25 MG tablet TAKE 1 TABLET BY MOUTH TWICE A DAY WITH A MEAL 180 tablet 3  . Coenzyme Q10 200 MG capsule Take 1 capsule (200 mg total) by mouth daily. 90 capsule 3  . ENTRESTO 97-103 MG TAKE 1 TABLET BY MOUTH TWICE DAILY 60 tablet 6  . finasteride (PROPECIA) 1  MG tablet TAKE 1 TABLET BY MOUTH EVERY DAY 90 tablet 3  . fluticasone (FLONASE) 50 MCG/ACT nasal spray Place 2 sprays into both nostrils daily. 16 g 3  . inclisiran (LEQVIO) 284 MG/1.5ML SOSY injection Inject 284 mg into the skin once. Inject at 0 months, 3 months and then every 6 months    . Multiple Vitamins-Minerals (PRESERVISION AREDS 2+MULTI VIT PO) Take 1 tablet by mouth in the morning and at bedtime.    . mupirocin ointment (BACTROBAN) 2 % APPLY EXTERNALLY TO FACE EVERY DAY AS NEEDED    . pantoprazole (PROTONIX) 40 MG tablet Take 1 tablet (40 mg total) by mouth 2 (two) times daily. 180 tablet 0  . patiromer (VELTASSA) 8.4 g packet Take 1 packet (8.4 g total) by mouth daily. 30 packet 5  . Rivaroxaban (XARELTO) 15 MG TABS tablet TAKE 1 TABLET BY MOUTH DAILY WITH SUPPER 90 tablet 3  . spironolactone (ALDACTONE) 25 MG tablet Take 1 tablet (25 mg total) by mouth daily. 90 tablet 3  . temazepam (RESTORIL) 15 MG capsule TAKE 1 CAPSULE(15 MG) BY MOUTH AT BEDTIME AS NEEDED FOR SLEEP 30 capsule 5  . torsemide (DEMADEX) 20 MG tablet Take 1 tablet (20 mg total) by mouth daily. 90 tablet 3  . traMADol (ULTRAM) 50 MG tablet TAKE 1 TABLET BY MOUTH THREE TIMES DAILY AS NEEDED FOR MODERATE OR SEVERE ARTHRITIS PAIN IN HANDS AND NECK 90 tablet 1   No facility-administered medications prior to visit.    Allergies  Allergen Reactions  . Atorvastatin Other (See Comments)    Severe fatigue on 10mg  and 40mg  dosing  . Nexlizet [Bempedoic Acid-Ezetimibe]     myalgias  . Repatha [Evolocumab] Other (See Comments)    Leg weakness  . Rosuvastatin Other (See Comments)    myalgias on 5mg  daily    Review of Systems  HENT: Negative.   Respiratory: Positive for shortness of breath. Negative for cough and wheezing.   Cardiovascular: Negative.  Negative for chest pain, palpitations and leg swelling.  Gastrointestinal: Negative.   Endocrine: Negative.   Musculoskeletal: Negative.   Skin: Negative.    Neurological:  Negative.   Psychiatric/Behavioral: Negative.   All other systems reviewed and are negative.      Objective:    Physical Exam Vitals reviewed.  Constitutional:      Appearance: He is well-developed.  Cardiovascular:     Rate and Rhythm: Normal rate. Rhythm irregular.  Pulmonary:     Effort: Pulmonary effort is normal.     Breath sounds: Normal breath sounds.  Abdominal:     General: Bowel sounds are normal.     Palpations: Abdomen is soft.  Musculoskeletal:        General: Normal range of motion.  Skin:    General: Skin is warm and dry.  Neurological:     General: No focal deficit present.     Mental Status: He is alert and oriented to person, place, and time.  Psychiatric:        Behavior: Behavior normal.     BP 102/67   Pulse 81   Temp (!) 97.2 F (36.2 C) (Temporal)   Ht 5\' 8"  (1.727 m)   Wt 174 lb 9.6 oz (79.2 kg)   SpO2 97%   BMI 26.55 kg/m  Wt Readings from Last 3 Encounters:  09/11/20 174 lb 9.6 oz (79.2 kg)  08/30/20 167 lb 9.6 oz (76 kg)  07/17/20 171 lb 6.4 oz (77.7 kg)    Health Maintenance Due  Topic Date Due  . TETANUS/TDAP  04/17/2014    There are no preventive care reminders to display for this patient.   Lab Results  Component Value Date   TSH 4.279 12/21/2018   Lab Results  Component Value Date   WBC 5.7 12/13/2019   HGB 12.8 (L) 12/13/2019   HCT 39.7 12/13/2019   MCV 100.8 (H) 12/13/2019   PLT 171 12/13/2019   Lab Results  Component Value Date   NA 133 (L) 09/07/2020   K 4.7 09/07/2020   CO2 15 (L) 09/07/2020   GLUCOSE 119 (H) 09/07/2020   BUN 45 (H) 09/07/2020   CREATININE 1.89 (H) 09/07/2020   BILITOT 0.4 02/02/2020   ALKPHOS 103 02/02/2020   AST 20 02/02/2020   ALT 11 02/02/2020   PROT 7.3 02/02/2020   ALBUMIN 4.4 02/02/2020   CALCIUM 8.6 09/07/2020   ANIONGAP 10 07/17/2020   GFR 56.78 (L) 03/04/2018   Lab Results  Component Value Date   CHOL 190 02/02/2020   Lab Results  Component Value  Date   HDL 55 02/02/2020   Lab Results  Component Value Date   LDLCALC 118 (H) 02/02/2020   Lab Results  Component Value Date   TRIG 94 02/02/2020   Lab Results  Component Value Date   CHOLHDL 3.5 02/02/2020   Lab Results  Component Value Date   HGBA1C 5.8 08/27/2007       Assessment & Plan:   Problem List Items Addressed This Visit    Presence of biventricular cardiac pacemaker - MDT    Other Visit Diagnoses    Dyspnea, unspecified type    -  Primary   Relevant Orders   DG Chest 2 View   EKG 12-Lead       Call Dr. Claris Gladden office today and spoke with Nira Conn, RN who will contact the patient and schedule an appointment for recheck tomorrow.  In the meantime advised patient to go to the emergency department if symptoms worsen, developing increased shortness of breath, chest pain, nausea or diaphoresis.  Underlying, this could be a mild side effect of receiving the COVID-19  vaccine approximately 2 weeks ago.  EKG reassuring.  All   Kennyth Arnold, FNP

## 2020-09-11 NOTE — Patient Instructions (Addendum)
If you have increased shortness of breath, chest pain or heaviness please report to the emergency department immediately.  Cardiology will be in touch with you tomorrow regarding an appointment for recheck.  I called Dr. Claris Gladden office today and spoke with Nira Conn who will arrange that.

## 2020-09-11 NOTE — Telephone Encounter (Signed)
Pt scheduled to see Padonda today.

## 2020-09-11 NOTE — Telephone Encounter (Signed)
[facility] [place] RECORD AccessNurse Patient Name: Travis [name] ON Gender: Male DOB: [birth year] Age: 80 Y 3 M 24 D Return Phone Number: [phone] (Primary) Address: City/ State/ Zip: [place]  [address] Client [place] [facility] [place] [facility] [place] [facility] [place] Day Physician [name]- MD Contact Type Call Who Is Calling Patient / Member / Family / Caregiver Call Type Triage / Clinical Relationship To Patient Self Return Phone Number [phone] (Primary) Chief Complaint BREATHING - shortness of breath or sounds breathless Reason for Call Symptomatic / Request for Health Information Initial Comment Caller has shortness of breath, no chest pain Translation No Nurse Assessment Nurse: Radford Pax, RN, Eugene Garnet Date/Time (Eastern Time): 09/11/2020 9:39:22 AM Confirm and document reason for call. If symptomatic, describe symptoms. ---Caller has shortness of breath with activity, no chest pain. Does the patient have any new or worsening symptoms? ---Yes Will a triage be completed? ---Yes Related visit to physician within the last 2 weeks? ---No Does the PT have any chronic conditions? (i.e. diabetes, asthma, this includes High risk factors for pregnancy, etc.) ---Yes List chronic conditions. ---pacemaker Is this a behavioral health or substance abuse call? ---No Guidelines Guideline Title Affirmed Question Affirmed Notes Nurse Date/Time (Eastern Time) Breathing Difficulty [1] MILD difficulty breathing (e.g., minimal/no SOB at rest, SOB with walking, pulse <100) AND [2] NEW-onset or WORSE than normal Turner, RN, Rebekah 09/11/2020 9:41:10 AM PLEASE NOTE: All timestamps contained within this report are represented as [place] Standard Time. CONFIDENTIALTY NOTICE: This fax transmission is intended only for the addressee. It contains information that is legally  privileged, confidential or otherwise protected from use or disclosure. If you are not the intended recipient, you are strictly prohibited from reviewing, disclosing, copying using or disseminating any of this information or taking any action in reliance on or regarding this information. If you have received this fax in error, please notify us immediately by telephone so that we can arrange for its return to [place]. Phone: [phone], Toll-Free: [phone], Fax: [phone] Page: 2 of 2 Call Id: [id number] [place]. Time Eilene Ghazi Time) Disposition Final User 09/11/2020 9:35:31 AM Send to Urgent Queue Jaclyn Prime 09/11/2020 9:48:08 AM See HCP within 4 Hours (or PCP triage) Yes Radford Pax, RN, Eugene Garnet Caller Disagree/Comply Comply Caller Understands Yes PreDisposition Call Doctor Care Advice Given Per Guideline SEE HCP (OR PCP TRIAGE) WITHIN 4 HOURS: * IF OFFICE WILL BE OPEN: You need to be seen within the next 3 or 4 hours. Call your doctor (or NP/PA) now or as soon as the office opens. CALL BACK IF: * You become worse CARE ADVICE given per Breathing Difficulty (Adult) guideline. Comments User: Susie Cassette, RN Date/Time Eilene Ghazi Time): 09/11/2020 9:48:53 AM Annette Bertelson in office will contact pt shortly with an appt. Referrals REFERRED TO PCP OFFIC

## 2020-09-13 ENCOUNTER — Telehealth (HOSPITAL_COMMUNITY): Payer: Self-pay | Admitting: Cardiology

## 2020-09-13 NOTE — Telephone Encounter (Signed)
Pt left voicemail regarding PCP visit-was told Heathre would work in for appt    Returned call to patient Pt reports since receiving COVID vaccine booster ~2 weeks ago he has experienced increased SOB-breathing fine. ?reaction to vaccine Weight stable, no swelling, no dizziness.  Reports PCP saw patient 09/11/20 and would like for pt to follow  Up with Aundra Dubin    Above discussed with Dr Maceo Pro for patient to monitor  If persist can schedule followup in the future   Pt aware

## 2020-09-18 ENCOUNTER — Telehealth: Payer: Self-pay | Admitting: Pharmacist

## 2020-09-18 NOTE — Telephone Encounter (Signed)
Left message for pt advising him that we are still unable to reschedule his Leqvio injection (was initially scheduled a month ago, then had to be canceled while [facility] sorts out billing/reimbursement still - this is still in process).

## 2020-09-20 ENCOUNTER — Telehealth: Payer: Self-pay

## 2020-09-20 NOTE — Chronic Care Management (AMB) (Signed)
    Chronic Care Management Pharmacy Assistant   Name: Travis [name]  MRN: [id number] DOB: December 17, 1940  Reason for Encounter: General Adherence Call   Recent office visits:  09/11/20- Dutch Quint, FNP- dyspnea,Presence of biventricular cardiac pacemaker, spoke with cardiology to schedule appointment for recheck   Recent consult visits:  07/30/20- Rollen Sox, [facility]- hyperlipidemia addressed  Hospital visits:  None in previous 6 months  Medications: Outpatient Encounter Medications as of 09/20/2020  Medication Sig  . acetaminophen (TYLENOL) 500 MG tablet Take 1,000 mg by mouth every 8 (eight) hours as needed for moderate pain or headache.  . carvedilol (COREG) 25 MG tablet TAKE 1 TABLET BY MOUTH TWICE A DAY WITH A MEAL  . Coenzyme Q10 200 MG capsule Take 1 capsule (200 mg total) by mouth daily.  Marland Kitchen ENTRESTO 97-103 MG TAKE 1 TABLET BY MOUTH TWICE DAILY  . finasteride (PROPECIA) 1 MG tablet TAKE 1 TABLET BY MOUTH EVERY DAY  . fluticasone (FLONASE) 50 MCG/ACT nasal spray Place 2 sprays into both nostrils daily.  . inclisiran (LEQVIO) 284 MG/1.5ML SOSY injection Inject 284 mg into the skin once. Inject at 0 months, 3 months and then every 6 months  . Multiple Vitamins-Minerals (PRESERVISION AREDS 2+MULTI VIT PO) Take 1 tablet by mouth in the morning and at bedtime.  . mupirocin ointment (BACTROBAN) 2 % APPLY EXTERNALLY TO FACE EVERY DAY AS NEEDED  . pantoprazole (PROTONIX) 40 MG tablet Take 1 tablet (40 mg total) by mouth 2 (two) times daily.  . patiromer (VELTASSA) 8.4 g packet Take 1 packet (8.4 g total) by mouth daily.  . Rivaroxaban (XARELTO) 15 MG TABS tablet TAKE 1 TABLET BY MOUTH DAILY WITH SUPPER  . spironolactone (ALDACTONE) 25 MG tablet Take 1 tablet (25 mg total) by mouth daily.  . temazepam (RESTORIL) 15 MG capsule TAKE 1 CAPSULE(15 MG) BY MOUTH AT BEDTIME AS NEEDED FOR SLEEP  . torsemide (DEMADEX) 20 MG tablet Take 1 tablet (20 mg total) by mouth daily.  . traMADol  (ULTRAM) 50 MG tablet TAKE 1 TABLET BY MOUTH THREE TIMES DAILY AS NEEDED FOR MODERATE OR SEVERE ARTHRITIS PAIN IN HANDS AND NECK   No facility-administered encounter medications on file as of 09/20/2020.   Have you had any problems recently with your health? Patient stated he had a reaction to his second Covid- 19 booster that he received about two weeks ago. He stated his sxs have since subsided and he is feeling better  Have you had any problems with your pharmacy? Patient stated he has no issues currently with his pharmacy   What issues or side effects are you having with your medications? Patient stated he has no issues currently with his medications  What would you like me to pass along to Coleman Potts,CPP for them to help you with?  Patient did not have anything to pass along  What can we do to take care of you better? Patient did not offer any suggestions  Patient stated that he has not received his Leqvio Inj as yet due to insurance billing issues, but his Cardiologist office is working on it with his insurance company.   Star Rating Drugs: No star rating drugs   Cottie Banda, CMA

## 2020-09-21 ENCOUNTER — Telehealth: Payer: Self-pay

## 2020-09-21 NOTE — Telephone Encounter (Signed)
Patient is experiencing constipation due to his tramadol and has been taking mira-lax and doesn't seem to be helping him can we send something in for him that's stronger

## 2020-09-21 NOTE — Telephone Encounter (Signed)
Please schedule virtual with Yong Channel so they can discuss options.

## 2020-09-21 NOTE — Telephone Encounter (Signed)
Patient is scheduled   

## 2020-09-22 ENCOUNTER — Encounter: Payer: Self-pay | Admitting: Family Medicine

## 2020-09-22 ENCOUNTER — Telehealth: Payer: Medicare Other | Admitting: Family Medicine

## 2020-09-25 ENCOUNTER — Telehealth: Payer: Self-pay

## 2020-09-25 NOTE — Telephone Encounter (Signed)
-----   Message from Deboraha Sprang, MD sent at 09/25/2020 12:34 PM EDT ----- Please Inform Patient  Labs are normal x #CR is up a bit and the last dig level 2/22 was high Question for patient--did dig dose get adjusted in Feb;--- can we recheck bmet and dig in about 8 weeks. If dig dose was NOT decreased then, please decrease it by 50-%   Thanks

## 2020-09-25 NOTE — Telephone Encounter (Signed)
Spoke with pt and advised per Dr Caryl Comes labs are normal with exception creatinine is up from previous lab.  Pt is no longer taking digoxin.  This medication was stopped by Dr Aundra Dubin in February or March.  Appointment scheduled for repeat BMET 12/04/2020.  Pt verbalizes understanding and thanked Therapist, sports for the call.

## 2020-09-26 ENCOUNTER — Other Ambulatory Visit: Payer: Self-pay | Admitting: Gastroenterology

## 2020-09-26 ENCOUNTER — Other Ambulatory Visit: Payer: Self-pay

## 2020-09-26 MED ORDER — PANTOPRAZOLE SODIUM 40 MG PO TBEC: 40.0000 mg | DELAYED_RELEASE_TABLET | Freq: Two times a day (BID) | ORAL | 0 refills | Status: DC

## 2020-10-22 ENCOUNTER — Other Ambulatory Visit: Payer: Self-pay | Admitting: Family Medicine

## 2020-10-23 ENCOUNTER — Ambulatory Visit (HOSPITAL_BASED_OUTPATIENT_CLINIC_OR_DEPARTMENT_OTHER)
Admission: RE | Admit: 2020-10-23 | Discharge: 2020-10-23 | Disposition: A | Discharge status: Home / Self Care | Payer: Medicare Other | Source: Ambulatory Visit | Attending: Family Medicine | Admitting: Family Medicine

## 2020-10-23 ENCOUNTER — Ambulatory Visit (INDEPENDENT_AMBULATORY_CARE_PROVIDER_SITE_OTHER): Payer: Medicare Other

## 2020-10-23 ENCOUNTER — Telehealth (HOSPITAL_COMMUNITY): Payer: Self-pay

## 2020-10-23 ENCOUNTER — Encounter (HOSPITAL_COMMUNITY): Payer: Self-pay | Admitting: Cardiology

## 2020-10-23 ENCOUNTER — Other Ambulatory Visit: Payer: Self-pay

## 2020-10-23 ENCOUNTER — Other Ambulatory Visit: Payer: Self-pay | Admitting: Gastroenterology

## 2020-10-23 ENCOUNTER — Ambulatory Visit (HOSPITAL_COMMUNITY)
Admission: RE | Admit: 2020-10-23 | Discharge: 2020-10-23 | Disposition: A | Discharge status: Home / Self Care | Payer: Medicare Other | Source: Ambulatory Visit | Attending: Cardiology | Admitting: Cardiology

## 2020-10-23 ENCOUNTER — Telehealth: Payer: Self-pay | Admitting: Emergency Medicine

## 2020-10-23 VITALS — BP 90/60 | HR 69 | Wt 170.0 lb

## 2020-10-23 DIAGNOSIS — I2510 Atherosclerotic heart disease of native coronary artery without angina pectoris: Secondary | ICD-10-CM | POA: Diagnosis not present

## 2020-10-23 DIAGNOSIS — N183 Chronic kidney disease, stage 3 unspecified: Secondary | ICD-10-CM | POA: Insufficient documentation

## 2020-10-23 DIAGNOSIS — I6529 Occlusion and stenosis of unspecified carotid artery: Secondary | ICD-10-CM | POA: Diagnosis not present

## 2020-10-23 DIAGNOSIS — I5022 Chronic systolic (congestive) heart failure: Secondary | ICD-10-CM

## 2020-10-23 DIAGNOSIS — I420 Dilated cardiomyopathy: Secondary | ICD-10-CM | POA: Diagnosis not present

## 2020-10-23 DIAGNOSIS — Z87891 Personal history of nicotine dependence: Secondary | ICD-10-CM | POA: Diagnosis not present

## 2020-10-23 DIAGNOSIS — I447 Left bundle-branch block, unspecified: Secondary | ICD-10-CM | POA: Diagnosis not present

## 2020-10-23 DIAGNOSIS — I4821 Permanent atrial fibrillation: Secondary | ICD-10-CM

## 2020-10-23 DIAGNOSIS — I082 Rheumatic disorders of both aortic and tricuspid valves: Secondary | ICD-10-CM | POA: Insufficient documentation

## 2020-10-23 DIAGNOSIS — Z7901 Long term (current) use of anticoagulants: Secondary | ICD-10-CM | POA: Diagnosis not present

## 2020-10-23 DIAGNOSIS — E785 Hyperlipidemia, unspecified: Secondary | ICD-10-CM | POA: Insufficient documentation

## 2020-10-23 DIAGNOSIS — Z79899 Other long term (current) drug therapy: Secondary | ICD-10-CM | POA: Insufficient documentation

## 2020-10-23 DIAGNOSIS — I130 Hypertensive heart and chronic kidney disease with heart failure and stage 1 through stage 4 chronic kidney disease, or unspecified chronic kidney disease: Secondary | ICD-10-CM | POA: Diagnosis not present

## 2020-10-23 DIAGNOSIS — Z950 Presence of cardiac pacemaker: Secondary | ICD-10-CM | POA: Insufficient documentation

## 2020-10-23 LAB — CUP PACEART REMOTE DEVICE CHECK
Battery Remaining Longevity: 73 mo
Battery Voltage: 2.98 V
Brady Statistic AP VP Percent: 0 %
Brady Statistic AP VS Percent: 0 %
Brady Statistic AS VP Percent: 95.55 %
Brady Statistic AS VS Percent: 4.45 %
Brady Statistic RA Percent Paced: 0 %
Brady Statistic RV Percent Paced: 95.55 %
Date Time Interrogation Session: 20220530204506
Implantable Lead Implant Date: 20200831
Implantable Lead Implant Date: 20200831
Implantable Lead Location: 753858
Implantable Lead Location: 753860
Implantable Lead Model: 5076
Implantable Pulse Generator Implant Date: 20200831
Lead Channel Impedance Value: 1007 Ohm
Lead Channel Impedance Value: 304 Ohm
Lead Channel Impedance Value: 3420 Ohm
Lead Channel Impedance Value: 3420 Ohm
Lead Channel Impedance Value: 456 Ohm
Lead Channel Impedance Value: 456 Ohm
Lead Channel Impedance Value: 494 Ohm
Lead Channel Impedance Value: 513 Ohm
Lead Channel Impedance Value: 551 Ohm
Lead Channel Impedance Value: 855 Ohm
Lead Channel Impedance Value: 893 Ohm
Lead Channel Impedance Value: 893 Ohm
Lead Channel Impedance Value: 893 Ohm
Lead Channel Impedance Value: 950 Ohm
Lead Channel Pacing Threshold Amplitude: 0.625 V
Lead Channel Pacing Threshold Amplitude: 1.75 V
Lead Channel Pacing Threshold Pulse Width: 0.4 ms
Lead Channel Pacing Threshold Pulse Width: 0.7 ms
Lead Channel Sensing Intrinsic Amplitude: 7.75 mV
Lead Channel Sensing Intrinsic Amplitude: 7.75 mV
Lead Channel Setting Pacing Amplitude: 2.5 V
Lead Channel Setting Pacing Amplitude: 2.75 V
Lead Channel Setting Pacing Pulse Width: 0.4 ms
Lead Channel Setting Pacing Pulse Width: 0.7 ms
Lead Channel Setting Sensing Sensitivity: 2.8 mV

## 2020-10-23 LAB — BASIC METABOLIC PANEL
Anion gap: 10 (ref 5–15)
BUN: 73 mg/dL — ABNORMAL HIGH (ref 8–23)
CO2: 17 mmol/L — ABNORMAL LOW (ref 22–32)
Calcium: 8.9 mg/dL (ref 8.9–10.3)
Chloride: 104 mmol/L (ref 98–111)
Creatinine, Ser: 2.49 mg/dL — ABNORMAL HIGH (ref 0.61–1.24)
GFR, Estimated: 25 mL/min — ABNORMAL LOW (ref >60)
Glucose, Bld: 128 mg/dL — ABNORMAL HIGH (ref 70–99)
Potassium: 4.5 mmol/L (ref 3.5–5.1)
Sodium: 131 mmol/L — ABNORMAL LOW (ref 135–145)

## 2020-10-23 LAB — ECHOCARDIOGRAM COMPLETE
AR max vel: 1.09 cm2
AV Area VTI: 1.09 cm2
AV Area mean vel: 1.02 cm2
AV Mean grad: 16 mmHg
AV Peak grad: 27.9 mmHg
Ao pk vel: 2.64 m/s
MV M vel: 4.55 m/s
MV Peak grad: 82.8 mmHg
P 1/2 time: 515 msec
Radius: 0.5 cm
S' Lateral: 4.1 cm

## 2020-10-23 LAB — BRAIN NATRIURETIC PEPTIDE: B Natriuretic Peptide: 2390.9 pg/mL — ABNORMAL HIGH (ref 0.0–100.0)

## 2020-10-23 MED ORDER — TORSEMIDE 20 MG PO TABS
40.0000 mg | ORAL_TABLET | Freq: Every day | ORAL | 3 refills | Status: DC
Start: 2020-10-23 — End: 2020-11-01

## 2020-10-23 MED ORDER — CARVEDILOL 12.5 MG PO TABS
12.5000 mg | ORAL_TABLET | Freq: Two times a day (BID) | ORAL | 3 refills | Status: DC
Start: 2020-10-23 — End: 2020-11-09

## 2020-10-23 MED ORDER — ENTRESTO 24-26 MG PO TABS: 1.0000 | ORAL_TABLET | Freq: Two times a day (BID) | ORAL | 3 refills | Status: DC

## 2020-10-23 NOTE — Telephone Encounter (Signed)
Contacted patient in reference to scheduled remote received for elevated OptiVol. Patient states that he has had some issues with fluid and that he saw [facility] and that Dr. addressed his fluid issues with medication. Patient taking torsemide 20 mg tablets BID.Sending to Dr. Debby Bud.

## 2020-10-23 NOTE — Telephone Encounter (Signed)
See previous note

## 2020-10-23 NOTE — Progress Notes (Signed)
  Echocardiogram 2D Echocardiogram has been performed.  Travis [name] 10/23/2020, 9:48 AM

## 2020-10-23 NOTE — Patient Instructions (Signed)
Labs done today. We will contact you only if your labs are abnormal.  DECREASE Carvedilol to 12.5mg  (1 tablet) by mouth 2 times daily.  INCREASE Lasix to 60mg  (3 tablets) by mouth daily for 4 days THEN DECREASE to 40mg  (2 tablets) by mouth daily.   No other medication changes were made. Please continue all current medications as prescribed.  Your physician recommends that you schedule a follow-up appointment in: 10 days for a lab only appointment and in 3 weeks with our APP Clinic here in our office.  If you have any questions or concerns before your next appointment please send [place] a message through [place] or call our office at [phone].    TO LEAVE A MESSAGE FOR THE NURSE SELECT OPTION 2, PLEASE LEAVE A MESSAGE INCLUDING: . YOUR NAME . DATE OF BIRTH . CALL BACK NUMBER . REASON FOR CALL**this is important as we prioritize the call backs  YOU WILL RECEIVE A CALL BACK THE SAME DAY AS LONG AS YOU CALL BEFORE 4:00 PM   Do the following things EVERYDAY: 1) Weigh yourself in the morning before breakfast. Write it down and keep it in a log. 2) Take your medicines as prescribed 3) Eat low salt foods--Limit salt (sodium) to 2000 mg per day.  4) Stay as active as you can everyday 5) Limit all fluids for the day to less than 2 liters   At the [facility], you and your health needs are our priority. As part of our continuing mission to provide you with exceptional heart care, we have created designated Provider Care Teams. These Care Teams include your primary Cardiologist (physician) and Advanced Practice Providers (APPs- Physician Assistants and Nurse Practitioners) who all work together to provide you with the care you need, when you need it.   You may see any of the following providers on your designated Care Team at your next follow up: Marland Kitchen Dr Glori Bickers . Dr [name] . Darrick Grinder, NP . Lyda Jester, PA . Audry Riles, PharmD   Please be sure to  bring in all your medications bottles to every appointment.

## 2020-10-23 NOTE — Progress Notes (Signed)
Date:  10/23/2020   ID:  Travis [name], DOB [birth year], MRN [id number]   Provider location: [facility] Type of Visit: Established patient   PCP:  [name], MD  Cardiologist: [name] EP: [name]/[name]   History of Present Illness: Travis [name] is a 80 y.o. male with h/o persistent atrial fibrillation s/p multiple failed DCCV, failed Tikosyn, ERAF after atrial fibrillation ablation, bicuspid AV with moderate AS and Mild AI, HTN, HLD, and chronic systolic CHF (EF 62-83%) with presumed tachy-mediated CMP.   Pt failed DCCV 08/14/17, 08/31/17, and 09/17/17. Failed Tikosyn. Did have conversion with Amiodarone and DCCV. Pt underwent successful ablation 10/07/17.  However, he had ERAF. Admitted 5/18 - 10/14/17 with atrial fibrillation/RVR and acute on chronic systolic CHF.  Echo showed EF 30-35%. Diuresed and reloaded with Amio. Underwent successful DCCV 10/13/17.    Echo in 9/19 showed improvement in EF to 50% in NSR.   In 9/19, patient reported profound fatigue.  He was volume overloaded on exam and diuretic was increased.  CBC was done, hgb was low and patient ended up going to the ER for admission.  He was diagnosed with UGIB likely from duodenal ulcer, probably due to meloxicam.  This was stopped.   Again in 10/19, he was profoundly fatigued.  CBC showed hgb 6.7, he was sent to the ER where he had 2 unit transfusion and EGD was done again, showing duodenal stenosis.  I also stopped his atorvastatin.   He went back into atrial fibrillation and had successful TEE-DCCV in 2/20.  TEE showed EF 35-40% with mildly decreased RV systolic function, EF back down in setting of atrial fibrillation.  In 3/20, he was back in atrial fibrillation and has been in atrial fibrillation since then.  He saw [name] and it was decided not to pursue redo ablation.  Repeat echo in 7/20 showed EF 20-25%, moderately decreased RV systolic function, moderate-severe TR, bicuspid  aortic valve with moderate AI, dilated IVC.  Due to permanent atrial fibrillation and rapid rate at times with LBBB, it was decided to pursue AV nodal ablation with BiV pacing. He had Medtronic CRT-P placement in 8/20, then AV nodal ablation in 9/20.   Echo in 2/21 showed EF 35-40% with global hypokinesis, mildly decreased RV systolic function, mild MR, mild-moderate AI with moderate AS.   He has not tolerated Repatha, Zetia, or Nexlizet due to muscle weakness.  Trying to get Leqvio for him.   Echo was done today, showing EF 35-40%, diffuse hypokinesis, mildly decreased RV systolic function with mild RV enlargement, PASP 53 mmHg, moderate MR, severe biatrial enlargement, moderate AS with mean gradient 16 mmHg/AVA 1.1 cm^2, moderate AI, severe TR, IVC dilated.   Patient returns for followup of CHF and atrial fibrillation.  He remains in atrial fibrillation with BiV pacing s/p AV nodal ablation.  He has been more short of breath recently.  Increased peripheral edema.  Occasionally, he increases his torsemide to 40 mg daily but generally taking 20 mg daily.  He is short of breath walking across his house.  He is off Jardiance as he thought it made him feel more short of breath.  Occasional lightheadedness with rapid standing, BP is running low (SBP around 90).  No chest pain.  Weight stable.   Medtronic device interrogation: Decreased thoracic impedance. No VT.   Labs (3/19): LDL 65 Labs (8/19): K 5.2 => 4.1, creatinine 1.6 Labs (9/19): K 3.9, creatinine  1.78, hgb 9.6, TSH normal.  Labs (10/19): hgb 6.7 => 11.8, LFTs normal, K 3.6, creatinine 1.3 Labs (11/19): LFTs, TSH normal Labs (1/20): K 4, creatinine 1.15, hgb 14.5 Labs (2/20): Hgb 13.4 Labs (4/20): LFTs normal, TSH elevated Labs (6/20): LDL 38, HDL 64, K 4.2, creatinine 1.45, hgb 14.7 Labs (7/20): K 4.4, creatinine 1.43, TSH and free T3 normal, mildly elevated free T4, LFTs normal Labs (8/20): K 4.9, creatinine 1.26, digoxin 0.9 Labs  (9/20): K 4.3, creatinine 1.44 Labs (12/20): digoxin 0.7, K 5, creatinine 1.36 Labs (3/21): K 4.7, creatinine 1.31 Labs (7/21): K 4.7, creatinine 1.49, LDL 84 Labs (9/21): K 4.9, creatinine 1.49, LDL 118 Labs (10/21): digoxin 0.6 Labs (11/21): K 4.5, creatinine 1.79 Labs (4/22): K 4.7, creatinine 1.89  Past Medical History 1. Chronic systolic CHF: Possible tachy-mediated cardiomyopathy.    - Echo(08/13/2017): LVEF 30-35%, moderate AS, mild AI, severe LAE, RV mild dilated with systolic function mild/mod reduced, PA peak pressure 46 mm Hg. - Echo (9/19): EF 50%, mild diffuse hypokinesis, normal RV size and systolic function, PASP 70 mmHg, moderate AS, moderate AI, moderate MR.  - TEE (2/20): EF 35-40%, mild-moderate LV dilation, normal RV size with mildly decreased systolic function, mil MR, trileaflet aortic valve with mild AS (mean gradient 10 mmHg, AVA 1.7 cm^2), moderate AI.  - Echo (7/20): EF 20-25%, moderately decreased RV systolic function, moderate-severe TR, bicuspid aortic valve with moderate AI, dilated IVC.  - Medtronic CRT-P placement 8/20.  - AV nodal ablation 9/20 - Echo (2/21): EF 35-40% with global hypokinesis, mildly decreased RV systolic function, mild MR, mild-moderate AI with moderate AS.  - Echo (5/22): EF 35-40%, diffuse hypokinesis, mildly decreased RV systolic function with mild RV enlargement, PASP 53 mmHg, moderate MR, severe biatrial enlargement, moderate AS with mean gradient 16 mmHg/AVA 1.1 cm^2, moderate AI, severe TR, IVC dilated.  2. Atrial fibrillation: Has been difficult to control.  Failed Tikosyn and multiple DCCVs.  Failed amiodarone prior to ablation.  Atrial fibrillation ablation but then had ERAF.  He was cardioverted again in 5/19 with return to NSR.  DCCV again in 2/20 but back in atrial fibrillation in 3/20.  3. Aortic valve disorder: TEE 2/20 with mild AS, moderate AI (valve is not bicuspid).   - Echo 2/21 with mild-moderate AI, moderate AS.  - Echo  5/22 with moderate AS, moderate AI.  4. HTN 5. Hyperlipidemia: Severe fatigue with atorvastatin, myalgias with Crestor.  6. Mitral regurgitation: Moderate on last echo.   7. CAD: coronary calcium noted on chest CT.  8. CKD: Stage 3.  9. Carotid stenosis: Carotid dopplers (3/90) with 30-09% LICA stenosis.  - Carotid dopplers (2/33): 00-76% LICA stenosis.  - Carotid dopplers (2/26): 33-35% LICA stenosis.  10. Chronic LBBB 11. PUD: UGIB in 9/19 with duodenal ulcer probably due to meloxicam use.  - EGD (10/19): Duodenal stenosis.  12. Inguinal hernia 13. Suspected left subclavian stenosis with unequal pressures in arms.   14. Tricuspid regurgitation: Severe TR on 5/22 echo.   Current Outpatient Medications  Medication Sig Dispense Refill  . acetaminophen (TYLENOL) 500 MG tablet Take 1,000 mg by mouth every 8 (eight) hours as needed for moderate pain or headache.    . Coenzyme Q10 200 MG capsule Take 1 capsule (200 mg total) by mouth daily. 90 capsule 3  . finasteride (PROPECIA) 1 MG tablet TAKE 1 TABLET BY MOUTH EVERY DAY 90 tablet 3  . fluticasone (FLONASE) 50 MCG/ACT nasal spray Place 2 sprays into both  nostrils daily. 16 g 3  . Multiple Vitamins-Minerals (PRESERVISION AREDS 2+MULTI VIT PO) Take 1 tablet by mouth in the morning and at bedtime.    . mupirocin ointment (BACTROBAN) 2 % APPLY EXTERNALLY TO FACE EVERY DAY AS NEEDED    . patiromer (VELTASSA) 8.4 g packet Take 1 packet (8.4 g total) by mouth daily. 30 packet 5  . Rivaroxaban (XARELTO) 15 MG TABS tablet TAKE 1 TABLET BY MOUTH DAILY WITH SUPPER 90 tablet 3  . spironolactone (ALDACTONE) 25 MG tablet Take 1 tablet (25 mg total) by mouth daily. 90 tablet 3  . temazepam (RESTORIL) 15 MG capsule TAKE 1 CAPSULE(15 MG) BY MOUTH AT BEDTIME AS NEEDED FOR SLEEP 30 capsule 5  . carvedilol (COREG) 12.5 MG tablet Take 1 tablet (12.5 mg total) by mouth 2 (two) times daily with a meal. 180 tablet 3  . inclisiran (LEQVIO) 284 MG/1.5ML SOSY  injection Inject 284 mg into the skin once. Inject at 0 months, 3 months and then every 6 months    . pantoprazole (PROTONIX) 40 MG tablet Take 1 tablet (40 mg total) by mouth 2 (two) times daily for 15 days. Patient needs follow up appointment for future refills.  Please call [phone] to schedule. 30 tablet 0  . sacubitril-valsartan (ENTRESTO) 24-26 MG Take 1 tablet by mouth 2 (two) times daily. 180 tablet 3  . torsemide (DEMADEX) 20 MG tablet Take 2 tablets (40 mg total) by mouth daily. 180 tablet 3  . traMADol (ULTRAM) 50 MG tablet TAKE 1 TABLET BY MOUTH THREE TIMES DAILY AS NEEDED FOR MODERATE OR SEVERE ARTHRITIS PAIN IN HANDS AND NECK 90 tablet 1   No current facility-administered medications for this encounter.    Allergies:   Atorvastatin, Nexlizet [bempedoic acid-ezetimibe], Repatha [evolocumab], and Rosuvastatin   Social History:  The patient  reports that he quit smoking about 34 years ago. He has a 37.50 pack-year smoking history. He has never used smokeless tobacco. He reports previous alcohol use of about 1.0 standard drink of alcohol per week. He reports that he does not use drugs.   Family History:  The patient's family history includes Lung cancer (age of onset: 48) in his mother; Stroke in his brother and mother; Stroke (age of onset: 74) in his father.   ROS:  Please see the history of present illness.   All other systems are personally reviewed and negative.   Exam:   BP 90/60   Pulse 69   Wt 77.1 kg (170 lb)   SpO2 98%   BMI 25.85 kg/m  General: NAD Neck: JVP 12 cm with HJR, no thyromegaly or thyroid nodule.  Lungs: Clear to auscultation bilaterally with normal respiratory effort. CV: Nondisplaced PMI.  Heart regular S1/S2, no S3/S4, 3/6 HSM LLSB.  2+ edema 1/2 to knees.  No carotid bruit.  Normal pedal pulses.  Abdomen: Soft, nontender, no hepatosplenomegaly, no distention.  Skin: Intact without lesions or rashes.  Neurologic: Alert and oriented x 3.  Psych:  Normal affect. Extremities: No clubbing or cyanosis.  HEENT: Normal.   Recent Labs: 12/13/2019: Hemoglobin 12.8; Platelets 171 02/02/2020: ALT 11 09/07/2020: Magnesium 1.8 10/23/2020: B Natriuretic Peptide 2,390.9; BUN 73; Creatinine, Ser 2.49; Potassium 4.5; Sodium 131  Personally reviewed   Wt Readings from Last 3 Encounters:  10/23/20 77.1 kg (170 lb)  09/11/20 79.2 kg (174 lb 9.6 oz)  08/30/20 76 kg (167 lb 9.6 oz)    ASSESSMENT AND PLAN:  1. Chronic systolic CHF: Echo 3/15 with  EF 30-35%, mild to moderately decreased RV systolic function in setting of atrial fibrillation with RVR.  Echo in 9/19 with patient in NSR showed EF up to 50%, mild diffuse hypokinesis.  Back in atrial fibrillation in 2/20, TEE showed EF 35-40%.  Suspect he has atrial fibrillation/tachycardia-mediated cardiomyopathy given fall in EF with recurrent atrial fibrillation.  Echo in 7/20 with EF 20-25%, moderately decreased RV systolic function.  He is now s/p Medtronic CRT-P placement with AV nodal ablation. Echo in 2/21 showed EF 35-40% (some improvement), echo today showed EF 35-40% with mild RV dysfunction.  NYHA class III symptoms. He is volume overloaded by exam and Optivol.  BP has been low.  Creatinine returned today higher than prior at 2.49.  - Hold Entresto for a day and decrease to 24/26 bid with soft BP and elevated creatinine.     - Decrease Coreg to 12.5 mg bid with soft BP.      - Continue spironolactone 25 mg daily.  - Continue Veltassa to control K and allow spironolactone and Entresto use.  - He did not tolerate Jardiance well and is off it.  - Increase torsemide to 60 mg daily x 4 days, then decrease torsemide to 40 mg daily long-term.  Check BMET/BNP today and again in 10 days. Creatinine is up but he is significantly volume overloaded so will need more diuresis but must follow creatinine closely.  2. Atrial fibrillation: Persistent.  Difficult to control rate and rhythm. He failed Tikosyn. He  started on amiodarone, then had atrial fibrillation ablation in 5/19 with ERAF, requiring DCCV.  Most recently cardioverted in 2/20 but back in atrial fibrillation by 3/20 and has stayed in atrial fibrillation since.  He saw [name] and they decided against redo ablation. He is now s/p AV nodal ablation.    - He is off amiodarone given permanent atrial fibrillation.   - Continue Xarelto 15 mg daily 3. CAD: Extensive coronary calcification on last CT chest. No chest pain. I suspect that his cardiomyopathy is tachycardia-mediated/atrial fibrillation-related given rise in EF in NSR and then fall when back in atrial fibrillation rather than ischemic cardiomyopathy.  - No ASA with Xarelto use. - Unable to tolerate statins, Repatha, Zetia or Nexlizet.  He is now waiting to get inclisiran approval.    4. CKD: Stage 3. Creatinine up to 2.49.  - BP low, decreasing Coreg and Entresto as above.  - Will need more diuresis, watch creatinine closely with diuresis.  5. Aortic valve disease: TEE in 2/20 showed that aortic valve was not bicuspid (though TTE reports have read bicuspid) but did have mild stenosis and moderate AI.  Moderate AI on 7/20 echo.  Echo in 2/21 showed mild-moderate AI, moderate AS.  Echo in 5/22 with moderate AS and moderate AI.  6. Tricuspid regurgitation: Severe on echo today.  May improve with diuresis as markedly volume overloaded.   7. LBBB: Chronic, now s/p CRT.  8. Hyperlipidemia: Unable to tolerate statins, Zetia, Nexlizet, or Repatha.   - Awaiting inclisiran.  9. Carotid stenosis: Repeat carotid dopplers in 6/22.   10. Subclavian stenosis: Suspect left subclavian stenosis.  Not noted on carotid dopplers, but large pulse differential between the arm and difficult to palpate left radial pulse.  Seems asymptomatic.  - Medical management, control lipids.  - Take BP in right arm.   Recommended follow-up:  Followup with NP in 3 wks to reassess volume.   Signed, [name], MD   10/23/2020  [facility]  [place] [id number] [phone] (office) [phone] (fax)

## 2020-10-23 NOTE — Telephone Encounter (Signed)
-----   Message from Larey Dresser, MD sent at 10/23/2020  3:39 PM EDT ----- Creatinine is up a lot but he is volume overloaded.  Stop Entresto x 1 day then decrease to 24/26 bid.  BMET again on Monday.

## 2020-10-23 NOTE — Telephone Encounter (Signed)
Patient advised and verbalized understanding. Med list updated to reflect changes,lab order entered,lab appt scheduled.   Meds ordered this encounter  Medications  . sacubitril-valsartan (ENTRESTO) 24-26 MG    Sig: Take 1 tablet by mouth 2 (two) times daily.    Dispense:  180 tablet    Refill:  3    Please cancel all previous orders for current medication. Change in dosage or pill size.

## 2020-10-24 ENCOUNTER — Telehealth: Payer: Self-pay

## 2020-10-24 ENCOUNTER — Other Ambulatory Visit: Payer: Self-pay

## 2020-10-24 NOTE — Telephone Encounter (Signed)
Travis [name]  good morning  I dont see the previous note  HELP:))

## 2020-10-24 NOTE — Telephone Encounter (Signed)
Medication sent to pharmacy yesterday

## 2020-10-24 NOTE — Telephone Encounter (Signed)
MEDICATION: traMADol (ULTRAM) 50 MG tablet  PHARMACY:  [id number] - [place], [address] AT [address] Phone:  [phone]  Fax:  [phone]       Comments: Patient is completely out.   **Let patient know to contact pharmacy at the end of the day to make sure medication is ready. **  ** Please notify patient to allow 48-72 hours to process**  **Encourage patient to contact the pharmacy for refills or they can request refills through [facility]**

## 2020-10-25 NOTE — Telephone Encounter (Signed)
Patient had elevated OptiVol. Patient was contacted and advised that he was feeling ok and did have some fluid issues. [name] placed patient on torsemide 20 mg BID for fluid issues.

## 2020-10-29 ENCOUNTER — Other Ambulatory Visit: Payer: Self-pay

## 2020-10-29 ENCOUNTER — Ambulatory Visit (HOSPITAL_COMMUNITY)
Admission: RE | Admit: 2020-10-29 | Discharge: 2020-10-29 | Disposition: A | Discharge status: Home / Self Care | Payer: Medicare Other | Source: Ambulatory Visit | Attending: Cardiology | Admitting: Cardiology

## 2020-10-29 DIAGNOSIS — I5022 Chronic systolic (congestive) heart failure: Secondary | ICD-10-CM | POA: Insufficient documentation

## 2020-10-29 LAB — BASIC METABOLIC PANEL
Anion gap: 11 (ref 5–15)
BUN: 71 mg/dL — ABNORMAL HIGH (ref 8–23)
CO2: 18 mmol/L — ABNORMAL LOW (ref 22–32)
Calcium: 8.7 mg/dL — ABNORMAL LOW (ref 8.9–10.3)
Chloride: 103 mmol/L (ref 98–111)
Creatinine, Ser: 2.62 mg/dL — ABNORMAL HIGH (ref 0.61–1.24)
GFR, Estimated: 24 mL/min — ABNORMAL LOW (ref >60)
Glucose, Bld: 131 mg/dL — ABNORMAL HIGH (ref 70–99)
Potassium: 3.9 mmol/L (ref 3.5–5.1)
Sodium: 132 mmol/L — ABNORMAL LOW (ref 135–145)

## 2020-11-01 ENCOUNTER — Telehealth (HOSPITAL_COMMUNITY): Payer: Self-pay | Admitting: *Deleted

## 2020-11-01 MED ORDER — TORSEMIDE 20 MG PO TABS: 60.0000 mg | ORAL_TABLET | Freq: Every day | ORAL | 3 refills | Status: DC

## 2020-11-01 NOTE — Telephone Encounter (Signed)
Pt called stating he feels like his fluid is up and would like to increase his diuretic. Pt slightly more short of breath. Pt denies swelling, weight gain, or chest pain but said he knows when fluid is building up.   Routed to [place] for advice

## 2020-11-01 NOTE — Telephone Encounter (Signed)
Can increase torsemide to 60 mg daily (if taking 40 mg daily).  He needs to come in for BMET on Monday.

## 2020-11-01 NOTE — Telephone Encounter (Signed)
Pt said he is scheduled for lab work next week and will have it sent to our office

## 2020-11-01 NOTE — Telephone Encounter (Signed)
Pt aware and agreeable with plan.  

## 2020-11-02 ENCOUNTER — Other Ambulatory Visit (HOSPITAL_COMMUNITY): Payer: Medicare Other

## 2020-11-08 NOTE — Progress Notes (Signed)
Date:  11/09/2020   ID:  Travis [name], DOB [birth year], MRN [id number]   Provider location: [facility] Type of Visit: Established patient   PCP:  [name], MD  Cardiologist: [name] EP: [name]/[name]   History of Present Illness: Travis [name] is a 80 y.o. 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    Pt failed DCCV 08/14/17, 08/31/17, and 09/17/17. Failed Tikosyn. Did have conversion with Amiodarone and DCCV. Pt underwent successful ablation 10/07/17.  However, he had ERAF. Admitted 5/18 - 10/14/17 with atrial fibrillation/RVR and acute on chronic systolic CHF.  Echo showed EF 30-35%. Diuresed and reloaded with Amio. Underwent successful DCCV 10/13/17.     Echo in 9/19 showed improvement in EF to 50% in NSR.    In 9/19, patient reported profound fatigue.  He was volume overloaded on exam and diuretic was increased.  CBC was done, hgb was low and patient ended up going to the ER for admission.  He was diagnosed with UGIB likely from duodenal ulcer, probably due to meloxicam.  This was stopped.    Again in 10/19, he was profoundly fatigued.  CBC showed hgb 6.7, he was sent to the ER where he had 2 unit transfusion and EGD was done again, showing duodenal stenosis.  I also stopped his atorvastatin.   He went back into atrial fibrillation and had successful TEE-DCCV in 2/20.  TEE showed EF 35-40% with mildly decreased RV systolic function, EF back down in setting of atrial fibrillation.  In 3/20, he was back in atrial fibrillation and has been in atrial fibrillation since then.  He saw [name] and it was decided not to pursue redo ablation.  Repeat echo in 7/20 showed EF 20-25%, moderately decreased RV systolic function, moderate-severe TR, bicuspid  aortic valve with moderate AI, dilated IVC.  Due to permanent atrial fibrillation and rapid rate at times with LBBB, it was decided to pursue AV nodal ablation with BiV pacing. He had Medtronic CRT-P placement in 8/20, then AV nodal ablation in 9/20.   Echo in 2/21 showed EF 35-40% with global hypokinesis, mildly decreased RV systolic function, mild MR, mild-moderate AI with moderate AS.   He has not tolerated Repatha, Zetia, or Nexlizet due to muscle weakness.  Trying to get Leqvio for him.   Echo 5/22 showing EF 35-40%, diffuse hypokinesis, mildly decreased RV systolic function with mild RV enlargement, PASP 53 mmHg, moderate MR, severe biatrial enlargement, moderate AS with mean gradient 16 mmHg/AVA 1.1 cm^2, moderate AI, severe TR, IVC dilated.    Patient returned for followup of CHF and atrial fibrillation 5/22.  He remained in atrial fibrillation with BiV pacing s/p AV nodal ablation.  More SOB and LE edema. Diuretics adjusted, beta blocker decreased to allow more BP room for diuresis, creatinine rising.  Today he returns for HF follow up. Overall feeling miserable, arrived in wheelchair. Swelling worse, SOB with any activity, not urinating well with increase in torsemide. Denies CP, dizziness, or PND/Orthopnea. Appetite poor. No fever or chills. Weight at home 162-163 pounds. Taking all medications. Recently stopped Entresto due to increasing creatine. Drinking a lot of fluids.  Medtronic device interrogation: OptiVol up, thoracic impedence down, >99% pacing. (Personally reviewed).   Reds: 38%  Labs (3/19): LDL 65  Labs (8/19): K 5.2 => 4.1, creatinine 1.6 Labs (9/19): K 3.9, creatinine 1.78, hgb 9.6, TSH normal.  Labs (10/19): hgb 6.7 => 11.8, LFTs normal, K 3.6, creatinine 1.3 Labs (11/19): LFTs, TSH normal Labs (1/20): K 4, creatinine 1.15, hgb 14.5 Labs (2/20): Hgb 13.4 Labs (4/20): LFTs normal, TSH elevated Labs (6/20): LDL 38, HDL 64, K 4.2, creatinine 1.45, hgb 14.7 Labs (7/20):  K 4.4, creatinine 1.43, TSH and free T3 normal, mildly elevated free T4, LFTs normal Labs (8/20): K 4.9, creatinine 1.26, digoxin 0.9 Labs (9/20): K 4.3, creatinine 1.44 Labs (12/20): digoxin 0.7, K 5, creatinine 1.36 Labs (3/21): K 4.7, creatinine 1.31 Labs (7/21): K 4.7, creatinine 1.49, LDL 84 Labs (9/21): K 4.9, creatinine 1.49, LDL 118 Labs (10/21): digoxin 0.6 Labs (11/21): K 4.5, creatinine 1.79 Labs (4/22): K 4.7, creatinine 1.89 Labs (6/22): K 3.9, creatinine 2.62, BNP 2390   Past Medical History 1. Chronic systolic CHF: Possible tachy-mediated cardiomyopathy.    - Echo (08/13/2017): LVEF 30-35%, moderate AS, mild AI, severe LAE, RV mild dilated with systolic function mild/mod reduced, PA peak pressure 46 mm Hg. - Echo (9/19): EF 50%, mild diffuse hypokinesis, normal RV size and systolic function, PASP 70 mmHg, moderate AS, moderate AI, moderate MR.  - TEE (2/20): EF 35-40%, mild-moderate LV dilation, normal RV size with mildly decreased systolic function, mil MR, trileaflet aortic valve with mild AS (mean gradient 10 mmHg, AVA 1.7 cm^2), moderate AI.  - Echo (7/20): EF 20-25%, moderately decreased RV systolic function, moderate-severe TR, bicuspid aortic valve with moderate AI, dilated IVC.  - Medtronic CRT-P placement 8/20.  - AV nodal ablation 9/20 - Echo (2/21): EF 35-40% with global hypokinesis, mildly decreased RV systolic function, mild MR, mild-moderate AI with moderate AS.  - Echo (5/22): EF 35-40%, diffuse hypokinesis, mildly decreased RV systolic function with mild RV enlargement, PASP 53 mmHg, moderate MR, severe biatrial enlargement, moderate AS with mean gradient 16 mmHg/AVA 1.1 cm^2, moderate AI, severe TR, IVC dilated.  2. Atrial fibrillation: Has been difficult to control.  Failed Tikosyn and multiple DCCVs.  Failed amiodarone prior to ablation.  Atrial fibrillation ablation but then had ERAF.  He was cardioverted again in 5/19 with return to NSR.  DCCV again in 2/20  but back in atrial fibrillation in 3/20.  3. Aortic valve disorder: TEE 2/20 with mild AS, moderate AI (valve is not bicuspid).   - Echo 2/21 with mild-moderate AI, moderate AS.  - Echo 5/22 with moderate AS, moderate AI.  4. HTN 5. Hyperlipidemia: Severe fatigue with atorvastatin, myalgias with Crestor.  6. Mitral regurgitation: Moderate on last echo.   7. CAD: coronary calcium noted on chest CT.  8. CKD: Stage 3.  9. Carotid stenosis: Carotid dopplers (9/37) with 90-24% LICA stenosis.  - Carotid dopplers (0/97): 35-32% LICA stenosis.  - Carotid dopplers (9/92): 42-68% LICA stenosis.  10. Chronic LBBB 11. PUD: UGIB in 9/19 with duodenal ulcer probably due to meloxicam use.  - EGD (10/19): Duodenal stenosis.  12. Inguinal hernia 13. Suspected left subclavian stenosis with unequal pressures in arms.   14. Tricuspid regurgitation: Severe TR on 5/22 echo.   Current Outpatient Medications  Medication Sig Dispense Refill   acetaminophen (TYLENOL) 500 MG tablet Take 1,000 mg by mouth every 8 (eight) hours as needed for moderate pain or headache.     Coenzyme Q10 200 MG capsule Take 1 capsule (200 mg total) by mouth daily. 90 capsule 3   finasteride (PROPECIA) 1 MG tablet  TAKE 1 TABLET BY MOUTH EVERY DAY 90 tablet 3   Multiple Vitamins-Minerals (PRESERVISION AREDS 2+MULTI VIT PO) Take 1 tablet by mouth in the morning and at bedtime.     mupirocin ointment (BACTROBAN) 2 % APPLY EXTERNALLY TO FACE EVERY DAY AS NEEDED     pantoprazole (PROTONIX) 40 MG tablet Take 40 mg by mouth 2 (two) times daily.     patiromer (VELTASSA) 8.4 g packet Take 1 packet (8.4 g total) by mouth daily. 30 packet 5   Rivaroxaban (XARELTO) 15 MG TABS tablet TAKE 1 TABLET BY MOUTH DAILY WITH SUPPER 90 tablet 3   spironolactone (ALDACTONE) 25 MG tablet Take 1 tablet (25 mg total) by mouth daily. 90 tablet 3   temazepam (RESTORIL) 15 MG capsule TAKE 1 CAPSULE(15 MG) BY MOUTH AT BEDTIME AS NEEDED FOR SLEEP 30 capsule 5    torsemide (DEMADEX) 20 MG tablet Take 3 tablets (60 mg total) by mouth daily. 180 tablet 3   traMADol (ULTRAM) 50 MG tablet TAKE 1 TABLET BY MOUTH THREE TIMES DAILY AS NEEDED FOR MODERATE OR SEVERE ARTHRITIS PAIN IN HANDS AND NECK 90 tablet 1   carvedilol (COREG) 6.25 MG tablet Take 1.5 tablets (9.375 mg total) by mouth 2 (two) times daily with a meal. 90 tablet 3   Current Facility-Administered Medications  Medication Dose Route Frequency Provider Last Rate Last Admin   furosemide (LASIX) injection 80 mg  80 mg Intravenous Once [place], Aubry Rankin M, FNP        Allergies:   Atorvastatin, Nexlizet [bempedoic acid-ezetimibe], Repatha [evolocumab], and Rosuvastatin   Social History:  The patient  reports that he quit smoking about 34 years ago. He has a 37.50 pack-year smoking history. He has never used smokeless tobacco. He reports previous alcohol use of about 1.0 standard drink of alcohol per week. He reports that he does not use drugs.   Family History:  The patient's family history includes Lung cancer (age of onset: 64) in his mother; Stroke in his brother and mother; Stroke (age of onset: 59) in his father.   ROS:  Please see the history of present illness.   All other systems are personally reviewed and negative.   Recent Labs: 12/13/2019: Hemoglobin 12.8; Platelets 171 02/02/2020: ALT 11 09/07/2020: Magnesium 1.8 10/23/2020: B Natriuretic Peptide 2,390.9 10/29/2020: BUN 71; Creatinine, Ser 2.62; Potassium 3.9; Sodium 132  Personally reviewed   Wt Readings from Last 3 Encounters:  11/09/20 75.1 kg (165 lb 9.6 oz)  10/23/20 77.1 kg (170 lb)  09/11/20 79.2 kg (174 lb 9.6 oz)   Exam:   BP 90/60   Pulse 90   Wt 75.1 kg (165 lb 9.6 oz)   BMI 25.18 kg/m  General:  NAD. No resp difficulty, arrived in wheelchair, frail-appearing. HEENT: Normal Neck: Supple. JVP to jaw. V waves present. Carotids 2+ bilat; no bruits. No lymphadenopathy or thryomegaly appreciated. Cor: PMI nondisplaced.  Regular rate & rhythm. No rubs, gallops or III/VI HSM LLSB Lungs: Clear Abdomen: + distended, nontender, . No hepatosplenomegaly. No bruits or masses. Good bowel sounds. Extremities: No cyanosis, clubbing, rash, 3+  bilateral LE edema, warm Neuro: alert & oriented x 3, cranial nerves grossly intact. Moves all 4 extremities w/o difficulty. Affect pleasant.  ASSESSMENT AND PLAN: 1. Acute on chronic systolic CHF: Echo 1/61 with EF 30-35%, mild to moderately decreased RV systolic function in setting of atrial fibrillation with RVR.  Echo in 9/19 with patient in NSR showed EF up to 50%, mild diffuse hypokinesis.  Back in atrial fibrillation in 2/20, TEE showed EF 35-40%.  Suspect he has atrial fibrillation/tachycardia-mediated cardiomyopathy given fall in EF with recurrent atrial fibrillation.  Echo in 7/20 with EF 20-25%, moderately decreased RV systolic function.  He is now s/p Medtronic CRT-P placement with AV nodal ablation. Echo in 2/21 showed EF 35-40% (some improvement), echo 5/22 showed EF 35-40% with mild RV dysfunction.  NYHA class IIIb-IV symptoms. He is volume overloaded by exam and Optivol.  BP has been low. Weight stable.  - Give 80 mg IV lasix x 1 now in clinic. BMET & BNP now. - Stop torsemide and spiro. - Decrease Coreg further to 6.25 mg bid      - Entresto stopped due to rising creatinine and BP. - He did not tolerate Jardiance well and is off it. - Nurse, mental health boots.  - Repeat BMET today creatinine is up now to 2.96, K 3.0. Consider stopping Veltassa after repeat BMET 2. Atrial fibrillation: Persistent.  Difficult to control rate and rhythm.  He failed Tikosyn.  He started on amiodarone, then had atrial fibrillation ablation in 5/19 with ERAF, requiring DCCV.  Most recently cardioverted in 2/20 but back in atrial fibrillation by 3/20 and has stayed in atrial fibrillation since.  He saw [name] and they decided against redo ablation. He is now s/p AV nodal ablation.    - He is off  amiodarone given permanent atrial fibrillation.   - Continue Xarelto 15 mg daily. No bleeding issues. Check CBC today. 3. CAD: Extensive coronary calcification on last CT chest.  No chest pain. I suspect that his cardiomyopathy is tachycardia-mediated/atrial fibrillation-related given rise in EF in NSR and then fall when back in atrial fibrillation rather than ischemic cardiomyopathy.  - No ASA with Xarelto use. - Unable to tolerate statins, Repatha, Zetia or Nexlizet.  He is now waiting to get inclisiran approval.    4. AKI on CKD Stage 3b: Stop torsemide and spiro as above. - BMET with creatinine now 2.96. 5. Aortic valve disease: TEE in 2/20 showed that aortic valve was not bicuspid (though TTE reports have read bicuspid) but did have mild stenosis and moderate AI.  Moderate AI on 7/20 echo.  Echo in 2/21 showed mild-moderate AI, moderate AS.  Echo in 5/22 with moderate AS and moderate AI.  6. Tricuspid regurgitation: Severe on echo 5/22.  May improve with diuresis as markedly volume overloaded.   7. LBBB: Chronic, now s/p CRT.  8. Hyperlipidemia: Unable to tolerate statins, Zetia, Nexlizet, or Repatha.   - Awaiting inclisiran.  9. Carotid stenosis: Repeat carotid dopplers in 6/22. 10. Subclavian stenosis: Suspect left subclavian stenosis.  Not noted on carotid dopplers, but large pulse differential between the arm and difficult to palpate left radial pulse.  Seems asymptomatic.  - Medical management, control lipids.  - Take BP in right arm.   Kidney function continues to rise, creatinine now 2.96. TR exacerbating efforts to diurese. Patient voided 200 ml after lasix 80 mg IV in clinic. Discussed with Dr. Haroldine Laws, will need admission for A/C HF with IV lasix gtt and unna boots. If he is unable to diurese, will need RHC with possible inotropic support.   Signed, Rafael Bihari, FNP-BC 11/09/2020  [facility] [address] [facility]  [place] [address] [phone] (office) [phone] (fax)

## 2020-11-09 ENCOUNTER — Ambulatory Visit (HOSPITAL_COMMUNITY)
Admission: RE | Admit: 2020-11-09 | Discharge: 2020-11-09 | Disposition: A | Discharge status: Home / Self Care | Payer: Medicare Other | Source: Ambulatory Visit | Attending: Family Medicine | Admitting: Family Medicine

## 2020-11-09 ENCOUNTER — Encounter (HOSPITAL_COMMUNITY): Payer: Self-pay | Admitting: Internal Medicine

## 2020-11-09 ENCOUNTER — Inpatient Hospital Stay (HOSPITAL_COMMUNITY)
Admission: AD | Admit: 2020-11-09 | Discharge: 2020-11-14 | DRG: 286 | Disposition: A | Discharge status: Hospice | Payer: Medicare Other | Source: Ambulatory Visit | Attending: Internal Medicine | Admitting: Internal Medicine

## 2020-11-09 ENCOUNTER — Other Ambulatory Visit: Payer: Self-pay

## 2020-11-09 ENCOUNTER — Encounter (HOSPITAL_COMMUNITY): Payer: Self-pay

## 2020-11-09 ENCOUNTER — Ambulatory Visit (HOSPITAL_COMMUNITY)
Admission: RE | Admit: 2020-11-09 | Payer: Medicare Other | Source: Ambulatory Visit | Attending: Cardiology | Admitting: Cardiology

## 2020-11-09 VITALS — BP 90/50 | HR 83 | Wt 165.6 lb

## 2020-11-09 DIAGNOSIS — I447 Left bundle-branch block, unspecified: Secondary | ICD-10-CM

## 2020-11-09 DIAGNOSIS — D509 Iron deficiency anemia, unspecified: Secondary | ICD-10-CM | POA: Diagnosis present

## 2020-11-09 DIAGNOSIS — Z7901 Long term (current) use of anticoagulants: Secondary | ICD-10-CM | POA: Diagnosis not present

## 2020-11-09 DIAGNOSIS — Z7189 Other specified counseling: Secondary | ICD-10-CM | POA: Diagnosis not present

## 2020-11-09 DIAGNOSIS — I4819 Other persistent atrial fibrillation: Secondary | ICD-10-CM | POA: Diagnosis present

## 2020-11-09 DIAGNOSIS — N1832 Chronic kidney disease, stage 3b: Secondary | ICD-10-CM | POA: Diagnosis present

## 2020-11-09 DIAGNOSIS — I6529 Occlusion and stenosis of unspecified carotid artery: Secondary | ICD-10-CM

## 2020-11-09 DIAGNOSIS — I4821 Permanent atrial fibrillation: Secondary | ICD-10-CM

## 2020-11-09 DIAGNOSIS — Z66 Do not resuscitate: Secondary | ICD-10-CM | POA: Diagnosis present

## 2020-11-09 DIAGNOSIS — M1990 Unspecified osteoarthritis, unspecified site: Secondary | ICD-10-CM | POA: Diagnosis present

## 2020-11-09 DIAGNOSIS — N400 Enlarged prostate without lower urinary tract symptoms: Secondary | ICD-10-CM | POA: Diagnosis not present

## 2020-11-09 DIAGNOSIS — Z888 Allergy status to other drugs, medicaments and biological substances status: Secondary | ICD-10-CM

## 2020-11-09 DIAGNOSIS — N189 Chronic kidney disease, unspecified: Secondary | ICD-10-CM | POA: Diagnosis not present

## 2020-11-09 DIAGNOSIS — I2510 Atherosclerotic heart disease of native coronary artery without angina pectoris: Secondary | ICD-10-CM | POA: Diagnosis present

## 2020-11-09 DIAGNOSIS — E785 Hyperlipidemia, unspecified: Secondary | ICD-10-CM | POA: Diagnosis present

## 2020-11-09 DIAGNOSIS — I082 Rheumatic disorders of both aortic and tricuspid valves: Secondary | ICD-10-CM | POA: Diagnosis present

## 2020-11-09 DIAGNOSIS — N179 Acute kidney failure, unspecified: Secondary | ICD-10-CM

## 2020-11-09 DIAGNOSIS — I5043 Acute on chronic combined systolic (congestive) and diastolic (congestive) heart failure: Secondary | ICD-10-CM | POA: Diagnosis not present

## 2020-11-09 DIAGNOSIS — I5084 End stage heart failure: Secondary | ICD-10-CM | POA: Diagnosis present

## 2020-11-09 DIAGNOSIS — I472 Ventricular tachycardia: Secondary | ICD-10-CM | POA: Diagnosis not present

## 2020-11-09 DIAGNOSIS — Z79899 Other long term (current) drug therapy: Secondary | ICD-10-CM | POA: Diagnosis not present

## 2020-11-09 DIAGNOSIS — I739 Peripheral vascular disease, unspecified: Secondary | ICD-10-CM | POA: Diagnosis not present

## 2020-11-09 DIAGNOSIS — R570 Cardiogenic shock: Secondary | ICD-10-CM | POA: Diagnosis not present

## 2020-11-09 DIAGNOSIS — K219 Gastro-esophageal reflux disease without esophagitis: Secondary | ICD-10-CM | POA: Diagnosis present

## 2020-11-09 DIAGNOSIS — Z515 Encounter for palliative care: Secondary | ICD-10-CM

## 2020-11-09 DIAGNOSIS — I509 Heart failure, unspecified: Secondary | ICD-10-CM | POA: Diagnosis not present

## 2020-11-09 DIAGNOSIS — Z87891 Personal history of nicotine dependence: Secondary | ICD-10-CM

## 2020-11-09 DIAGNOSIS — I871 Compression of vein: Secondary | ICD-10-CM

## 2020-11-09 DIAGNOSIS — I428 Other cardiomyopathies: Secondary | ICD-10-CM | POA: Diagnosis present

## 2020-11-09 DIAGNOSIS — I130 Hypertensive heart and chronic kidney disease with heart failure and stage 1 through stage 4 chronic kidney disease, or unspecified chronic kidney disease: Secondary | ICD-10-CM | POA: Diagnosis present

## 2020-11-09 DIAGNOSIS — I4891 Unspecified atrial fibrillation: Secondary | ICD-10-CM | POA: Diagnosis not present

## 2020-11-09 DIAGNOSIS — E876 Hypokalemia: Secondary | ICD-10-CM | POA: Diagnosis present

## 2020-11-09 DIAGNOSIS — I5023 Acute on chronic systolic (congestive) heart failure: Secondary | ICD-10-CM | POA: Diagnosis present

## 2020-11-09 DIAGNOSIS — Z20822 Contact with and (suspected) exposure to covid-19: Secondary | ICD-10-CM | POA: Diagnosis present

## 2020-11-09 DIAGNOSIS — I517 Cardiomegaly: Secondary | ICD-10-CM | POA: Diagnosis not present

## 2020-11-09 DIAGNOSIS — I708 Atherosclerosis of other arteries: Secondary | ICD-10-CM | POA: Diagnosis present

## 2020-11-09 DIAGNOSIS — I5021 Acute systolic (congestive) heart failure: Secondary | ICD-10-CM

## 2020-11-09 DIAGNOSIS — Z950 Presence of cardiac pacemaker: Secondary | ICD-10-CM | POA: Diagnosis not present

## 2020-11-09 DIAGNOSIS — I071 Rheumatic tricuspid insufficiency: Secondary | ICD-10-CM

## 2020-11-09 DIAGNOSIS — I429 Cardiomyopathy, unspecified: Secondary | ICD-10-CM | POA: Diagnosis not present

## 2020-11-09 DIAGNOSIS — I350 Nonrheumatic aortic (valve) stenosis: Secondary | ICD-10-CM

## 2020-11-09 DIAGNOSIS — I5022 Chronic systolic (congestive) heart failure: Secondary | ICD-10-CM

## 2020-11-09 DIAGNOSIS — N183 Chronic kidney disease, stage 3 unspecified: Secondary | ICD-10-CM

## 2020-11-09 DIAGNOSIS — I471 Supraventricular tachycardia: Secondary | ICD-10-CM | POA: Diagnosis not present

## 2020-11-09 LAB — IRON AND TIBC
Iron: 12 ug/dL — ABNORMAL LOW (ref 45–182)
Saturation Ratios: 3 % — ABNORMAL LOW (ref 17.9–39.5)
TIBC: 445 ug/dL (ref 250–450)
UIBC: 433 ug/dL

## 2020-11-09 LAB — BASIC METABOLIC PANEL
Anion gap: 14 (ref 5–15)
BUN: 76 mg/dL — ABNORMAL HIGH (ref 8–23)
CO2: 18 mmol/L — ABNORMAL LOW (ref 22–32)
Calcium: 8.7 mg/dL — ABNORMAL LOW (ref 8.9–10.3)
Chloride: 97 mmol/L — ABNORMAL LOW (ref 98–111)
Creatinine, Ser: 2.96 mg/dL — ABNORMAL HIGH (ref 0.61–1.24)
GFR, Estimated: 21 mL/min — ABNORMAL LOW (ref >60)
Glucose, Bld: 131 mg/dL — ABNORMAL HIGH (ref 70–99)
Potassium: 3 mmol/L — ABNORMAL LOW (ref 3.5–5.1)
Sodium: 129 mmol/L — ABNORMAL LOW (ref 135–145)

## 2020-11-09 LAB — MAGNESIUM: Magnesium: 1.4 mg/dL — ABNORMAL LOW (ref 1.7–2.4)

## 2020-11-09 LAB — CBC
HCT: 24.6 % — ABNORMAL LOW (ref 39.0–52.0)
Hemoglobin: 7.8 g/dL — ABNORMAL LOW (ref 13.0–17.0)
MCH: 26.5 pg (ref 26.0–34.0)
MCHC: 31.7 g/dL (ref 30.0–36.0)
MCV: 83.7 fL (ref 80.0–100.0)
Platelets: 198 10*3/uL (ref 150–400)
RBC: 2.94 MIL/uL — ABNORMAL LOW (ref 4.22–5.81)
RDW: 18.4 % — ABNORMAL HIGH (ref 11.5–15.5)
WBC: 6.3 10*3/uL (ref 4.0–10.5)
nRBC: 2.7 % — ABNORMAL HIGH (ref 0.0–0.2)

## 2020-11-09 LAB — BRAIN NATRIURETIC PEPTIDE: B Natriuretic Peptide: 2058.3 pg/mL — ABNORMAL HIGH (ref 0.0–100.0)

## 2020-11-09 LAB — FERRITIN: Ferritin: 21 ng/mL — ABNORMAL LOW (ref 24–336)

## 2020-11-09 LAB — SARS CORONAVIRUS 2 (TAT 6-24 HRS): SARS Coronavirus 2: NEGATIVE

## 2020-11-09 MED ORDER — FUROSEMIDE 10 MG/ML IJ SOLN
80.0000 mg | Freq: Once | INTRAMUSCULAR | Status: AC
  Administered 2020-11-09: 80 mg via INTRAVENOUS

## 2020-11-09 MED ORDER — CARVEDILOL 6.25 MG PO TABS: 9.3750 mg | ORAL_TABLET | Freq: Two times a day (BID) | ORAL | 3 refills | Status: DC

## 2020-11-09 MED ORDER — PANTOPRAZOLE SODIUM 40 MG PO TBEC: 40.0000 mg | DELAYED_RELEASE_TABLET | Freq: Two times a day (BID) | ORAL | Status: DC

## 2020-11-09 MED ORDER — RIVAROXABAN 15 MG PO TABS
15.0000 mg | ORAL_TABLET | Freq: Every day | ORAL | Status: DC
  Administered 2020-11-09 – 2020-11-10 (×2): 15 mg via ORAL
  Filled 2020-11-09 (×2): qty 1

## 2020-11-09 MED ORDER — SODIUM CHLORIDE 0.9 % IV SOLN: 250.0000 mL | INTRAVENOUS | Status: DC | PRN

## 2020-11-09 MED ORDER — PANTOPRAZOLE SODIUM 40 MG PO TBEC
40.0000 mg | DELAYED_RELEASE_TABLET | Freq: Two times a day (BID) | ORAL | Status: DC
  Administered 2020-11-09 – 2020-11-14 (×9): 40 mg via ORAL
  Filled 2020-11-09 (×9): qty 1

## 2020-11-09 MED ORDER — CARVEDILOL 6.25 MG PO TABS: 6.2500 mg | ORAL_TABLET | Freq: Two times a day (BID) | ORAL | Status: DC

## 2020-11-09 MED ORDER — POTASSIUM CHLORIDE CRYS ER 20 MEQ PO TBCR
40.0000 meq | EXTENDED_RELEASE_TABLET | Freq: Once | ORAL | Status: AC
  Administered 2020-11-09: 40 meq via ORAL
  Filled 2020-11-09: qty 2

## 2020-11-09 MED ORDER — MAGNESIUM SULFATE 4 GM/100ML IV SOLN
4.0000 g | Freq: Once | INTRAVENOUS | Status: AC
  Administered 2020-11-09: 4 g via INTRAVENOUS
  Filled 2020-11-09: qty 100

## 2020-11-09 MED ORDER — SODIUM CHLORIDE 0.9% FLUSH: 3.0000 mL | INTRAVENOUS | Status: DC | PRN

## 2020-11-09 MED ORDER — RIVAROXABAN 15 MG PO TABS: 15.0000 mg | ORAL_TABLET | Freq: Every day | ORAL | Status: DC

## 2020-11-09 MED ORDER — ACETAMINOPHEN 500 MG PO TABS: 1000.0000 mg | ORAL_TABLET | Freq: Three times a day (TID) | ORAL | Status: DC | PRN

## 2020-11-09 MED ORDER — FUROSEMIDE 10 MG/ML IJ SOLN
30.0000 mg/h | INTRAVENOUS | Status: DC
  Administered 2020-11-09 (×2): 20 mg/h via INTRAVENOUS
  Administered 2020-11-10: 30 mg/h via INTRAVENOUS
  Administered 2020-11-10: 20 mg/h via INTRAVENOUS
  Administered 2020-11-10 – 2020-11-11 (×4): 30 mg/h via INTRAVENOUS
  Filled 2020-11-09 (×9): qty 20

## 2020-11-09 MED ORDER — ACETAMINOPHEN 325 MG PO TABS: 650.0000 mg | ORAL_TABLET | ORAL | Status: DC | PRN

## 2020-11-09 MED ORDER — SODIUM CHLORIDE 0.9% FLUSH
3.0000 mL | Freq: Two times a day (BID) | INTRAVENOUS | Status: DC
  Administered 2020-11-09 – 2020-11-14 (×9): 3 mL via INTRAVENOUS

## 2020-11-09 NOTE — Progress Notes (Signed)
ReDS Vest / Clip - 11/09/20 1200       ReDS Vest / Clip   Station Marker C    Ruler Value 28    ReDS Value Range Moderate volume overload    ReDS Actual Value 38

## 2020-11-09 NOTE — Progress Notes (Signed)
Orthopedic Tech Progress Note Patient Details:  Travis [name] 05-18-1941 [id number]  Ortho Devices Type of Ortho Device: Haematologist Ortho Device/Splint Location: Bilateral Ortho Device/Splint Interventions: Ordered, Application   Post Interventions Patient Tolerated: Well  [name] 11/09/2020, 5:16 PM

## 2020-11-09 NOTE — H&P (Addendum)
Advanced Heart Failure Team History and Physical Note   PCP:  [name], MD  PCP-Cardiology: None   HF Cardiology: Dr. Haroldine Laws   Reason for Admission: A/C Systolic Heart Failure   HPI:    Travis [name] is a 80 y.o. male with h/o persistent atrial fibrillation s/p multiple failed DCCV, failed Tikosyn, ERAF after atrial fibrillation ablation, bicuspid AV with moderate AS and Mild AI, HTN, HLD, and chronic systolic CHF (EF 56-38%) with presumed tachy-mediated CMP.   Pt failed DCCV 08/14/17, 08/31/17, and 09/17/17. Failed Tikosyn. Did have conversion with Amiodarone and DCCV. Pt underwent successful ablation 10/07/17.  However, he had ERAF. Admitted 5/18 - 10/14/17 with atrial fibrillation/RVR and acute on chronic systolic CHF.  Echo showed EF 30-35%. Diuresed and reloaded with Amio. Underwent successful DCCV 10/13/17.     Echo in 9/19 showed improvement in EF to 50% in NSR.    In 9/19, patient reported profound fatigue.  He was volume overloaded on exam and diuretic was increased.  CBC was done, hgb was low and patient ended up going to the ER for admission.  He was diagnosed with UGIB likely from duodenal ulcer, probably due to meloxicam.  This was stopped.   Again in 10/19, he was profoundly fatigued.  CBC showed hgb 6.7, he was sent to the ER where he had 2 unit transfusion and EGD was done again, showing duodenal stenosis.  I also stopped his atorvastatin.   He went back into atrial fibrillation and had successful TEE-DCCV in 2/20.  TEE showed EF 35-40% with mildly decreased RV systolic function, EF back down in setting of atrial fibrillation.  In 3/20, he was back in atrial fibrillation and has been in atrial fibrillation since then.  He saw [name] and it was decided not to pursue redo ablation.   Repeat echo in 7/20 showed EF 20-25%, moderately decreased RV systolic function, moderate-severe TR, bicuspid aortic valve with moderate AI, dilated IVC.  Due to permanent atrial  fibrillation and rapid rate at times with LBBB, it was decided to pursue AV nodal ablation with BiV pacing. He had Medtronic CRT-P placement in 8/20, then AV nodal ablation in 9/20.   Echo in 2/21 showed EF 35-40% with global hypokinesis, mildly decreased RV systolic function, mild MR, mild-moderate AI with moderate AS.   He has not tolerated Repatha, Zetia, or Nexlizet due to muscle weakness.  Trying to get Leqvio for him.   Echo 5/22 showing EF 35-40%, diffuse hypokinesis, mildly decreased RV systolic function with mild RV enlargement, PASP 53 mmHg, moderate MR, severe biatrial enlargement, moderate AS with mean gradient 16 mmHg/AVA 1.1 cm^2, moderate AI, severe TR, IVC dilated.   Patient returned for followup of CHF and atrial fibrillation 5/22.  He remained in atrial fibrillation with BiV pacing s/p AV nodal ablation.  More SOB and LE edema. Diuretics adjusted, beta blocker decreased to allow more BP room for diuresis, creatinine rising.   Today he returned for HF follow up. Overall feeling miserable, arrived in wheelchair. Swelling worse, SOB with any activity, not urinating well with increase in torsemide. Denies CP, dizziness, or PND/Orthopnea. Appetite poor. No fever or chills. Weight at home 162-163 pounds. Taking all medications. Recently stopped Entresto due to increasing creatine. Drinking a lot of fluids.    Medtronic device interrogation: OptiVol up, thoracic impedence down, >99% pacing. (Personally reviewed).   Reds: 38%  Review of Systems: [y] = yes, [ ]  = no   General: Weight gain [ ] ;  Weight loss [ ] ; Anorexia [ y]; Fatigue [ y]; Fever [ ] ; Chills [ ] ; Weakness Blue.Reese ]  Cardiac: Chest pain/pressure [ ] ; Resting SOB [ y]; Exertional SOB [ y]; Orthopnea [ ] ; Pedal Edema [ x]; Palpitations [ ] ; Syncope [ ] ; Presyncope [ ] ; Paroxysmal nocturnal dyspnea[ ]   Pulmonary: Cough [ ] ; Wheezing[ ] ; Hemoptysis[ ] ; Sputum [ ] ; Snoring [ ]   GI: Vomiting[ ] ; Dysphagia[ ] ; Melena[ ] ; Hematochezia  [ ] ; Heartburn[ ] ; Abdominal pain [ ] ; Constipation [ ] ; Diarrhea [ ] ; BRBPR [ ]   GU: Hematuria[ ] ; Dysuria [ ] ; Nocturia[ ]   Vascular: Pain in legs with walking [ ] ; Pain in feet with lying flat [ ] ; Non-healing sores [ ] ; Stroke [ ] ; TIA [ ] ; Slurred speech [ ] ;  Neuro: Headaches[ ] ; Vertigo[ ] ; Seizures[ ] ; Paresthesias[ ] ;Blurred vision [ ] ; Diplopia [ ] ; Vision changes [ ]   Ortho/Skin: Arthritis [ ] ; Joint pain [ ] ; Muscle pain [ ] ; Joint swelling [ ] ; Back Pain [ ] ; Rash [ ]   Psych: Depression[ ] ; Anxiety[ ]   Heme: Bleeding problems [ ] ; Clotting disorders [ ] ; Anemia [ ]   Endocrine: Diabetes [ ] ; Thyroid dysfunction[ ]    Home Medications Prior to Admission medications   Medication Sig Start Date End Date Taking? Authorizing Provider  acetaminophen (TYLENOL) 500 MG tablet Take 1,000 mg by mouth every 8 (eight) hours as needed for moderate pain or headache.    [provider]  carvedilol (COREG) 6.25 MG tablet Take 1.5 tablets (9.375 mg total) by mouth 2 (two) times daily with a meal. 11/09/20   [place], Maricela Bo, FNP  Coenzyme Q10 200 MG capsule Take 1 capsule (200 mg total) by mouth daily. 10/13/19   Larey Dresser, MD  finasteride (PROPECIA) 1 MG tablet TAKE 1 TABLET BY MOUTH EVERY DAY 03/18/20   [name], MD  Multiple Vitamins-Minerals (PRESERVISION AREDS 2+MULTI VIT PO) Take 1 tablet by mouth in the morning and at bedtime.    [provider]  mupirocin ointment (BACTROBAN) 2 % APPLY EXTERNALLY TO FACE EVERY DAY AS NEEDED 05/23/19   [provider]  pantoprazole (PROTONIX) 40 MG tablet Take 40 mg by mouth 2 (two) times daily.    [provider]  patiromer (VELTASSA) 8.4 g packet Take 1 packet (8.4 g total) by mouth daily. 06/13/20   Larey Dresser, MD  Rivaroxaban Alveda Reasons) 15 MG TABS tablet TAKE 1 TABLET BY MOUTH DAILY WITH SUPPER 12/07/19   Larey Dresser, MD  spironolactone (ALDACTONE) 25 MG tablet Take 1 tablet (25 mg total) by mouth  daily. 10/13/19   Larey Dresser, MD  temazepam (RESTORIL) 15 MG capsule TAKE 1 CAPSULE(15 MG) BY MOUTH AT BEDTIME AS NEEDED FOR SLEEP 05/31/20   [name], MD  torsemide (DEMADEX) 20 MG tablet Take 3 tablets (60 mg total) by mouth daily. 11/01/20   Larey Dresser, MD  traMADol (ULTRAM) 50 MG tablet TAKE 1 TABLET BY MOUTH THREE TIMES DAILY AS NEEDED FOR MODERATE OR SEVERE ARTHRITIS PAIN IN HANDS AND NECK 10/23/20   [name], MD   Past Medical History: Past Medical History:  Diagnosis Date   Arthritis    Biatrial enlargement    severe   Bicuspid aortic valve    CHF (congestive heart failure) ([place])    JULY 2014   Chronic renal insufficiency    not aware   GERD (gastroesophageal reflux disease)    History of cardioversion 12/16/2012  History of stomach ulcers    Hypertension    Left bundle branch block    Nonischemic cardiomyopathy (HCC)        Persistent atrial fibrillation (HCC)    multiple prior cardioversion   Sinus bradycardia    Status post clamping of cerebral aneurysm 79   Past Surgical History: Past Surgical History:  Procedure Laterality Date   ATRIAL FIBRILLATION ABLATION N/A 10/06/2017   Procedure: ATRIAL FIBRILLATION ABLATION;  Surgeon: [name], MD;  Location: [facility];  Service: Cardiovascular;  Laterality: N/A;   AV NODE ABLATION N/A 02/18/2019   Procedure: AV NODE ABLATION;  Surgeon: Deboraha Sprang, MD;  Location: [facility];  Service: Cardiovascular;  Laterality: N/A;   BIOPSY  02/06/2018   Procedure: BIOPSY;  Surgeon: Lavena Bullion, DO;  Location: [facility];  Service: Gastroenterology;;   BIOPSY  02/28/2018   Procedure: BIOPSY;  Surgeon: Mauri Pole, MD;  Location: [facility];  Service: Endoscopy;;   BIV PACEMAKER INSERTION CRT-P N/A 01/24/2019   Procedure: BIV PACEMAKER INSERTION CRT-P;  Surgeon: Deboraha Sprang, MD;  Location: [facility];  Service: Cardiovascular;  Laterality: N/A;   CARDIAC  CATHETERIZATION     not awaer of this   CARDIOVERSION N/A 12/16/2012   Procedure: CARDIOVERSION;  Surgeon: Lelon Perla, MD;  Location: [facility];  Service: Cardiovascular;  Laterality: N/A;   CARDIOVERSION N/A 08/14/2017   Procedure: CARDIOVERSION;  Surgeon: Josue Hector, MD;  Location: [facility];  Service: Cardiovascular;  Laterality: N/A;   CARDIOVERSION N/A 09/21/2017   Procedure: CARDIOVERSION;  Surgeon: Sanda Klein, MD;  Location: [facility];  Service: Cardiovascular;  Laterality: N/A;   CARDIOVERSION N/A 10/13/2017   Procedure: CARDIOVERSION;  Surgeon: Josue Hector, MD;  Location: [facility];  Service: Cardiovascular;  Laterality: N/A;   CARDIOVERSION N/A 07/05/2018   Procedure: CARDIOVERSION;  Surgeon: Larey Dresser, MD;  Location: [facility];  Service: Cardiovascular;  Laterality: N/A;   CATARACT EXTRACTION  05/2015   bilateral   cerebral anuersym post clips     ESOPHAGOGASTRODUODENOSCOPY N/A 02/06/2018   Procedure: ESOPHAGOGASTRODUODENOSCOPY (EGD);  Surgeon: Lavena Bullion, DO;  Location: [facility] ENDOSCOPY;  Service: Gastroenterology;  Laterality: N/A;   ESOPHAGOGASTRODUODENOSCOPY N/A 02/28/2018   Procedure: ESOPHAGOGASTRODUODENOSCOPY (EGD);  Surgeon: Mauri Pole, MD;  Location: [facility] ENDOSCOPY;  Service: Endoscopy;  Laterality: N/A;   fractured left arm     HERNIA REPAIR     lft   INGUINAL HERNIA REPAIR  07/02/2011   Procedure: LAPAROSCOPIC INGUINAL HERNIA;  Surgeon: Harl Bowie, MD;  Location: [facility];  Service: General;  Laterality: Left;  Laparoscopic left inguinal hernia repair and mesh   INGUINAL HERNIA REPAIR Left 11/24/2018   Procedure: OPEN LEFT INGUINAL HERNIA REPAIR WITH MESH;  Surgeon: Coralie Keens, MD;  Location: [facility];  Service: General;  Laterality: Left;  TAP BLOCK   left tendon repair     lft foot   ROTATOR CUFF REPAIR     lf   TEE WITHOUT CARDIOVERSION N/A 08/14/2017   Procedure: TRANSESOPHAGEAL ECHOCARDIOGRAM (TEE);  Surgeon:  Josue Hector, MD;  Location: [facility] ENDOSCOPY;  Service: Cardiovascular;  Laterality: N/A;   TEE WITHOUT CARDIOVERSION N/A 07/05/2018   Procedure: TRANSESOPHAGEAL ECHOCARDIOGRAM (TEE);  Surgeon: Larey Dresser, MD;  Location: [facility] ENDOSCOPY;  Service: Cardiovascular;  Laterality: N/A;   TONSILLECTOMY     TOTAL KNEE ARTHROPLASTY Bilateral 02/06/2014   Procedure: TOTAL KNEE BILATERAL;  Surgeon: Mauri Pole, MD;  Location: [facility];  Service: Orthopedics;  Laterality: Bilateral;   WRIST GANGLION EXCISION     lft    Family History:  Family History  Problem Relation Age of Onset   Stroke Father 49       smoker   Lung cancer Mother 85       former smoker   Stroke Mother    Stroke Brother    Colon cancer Neg Hx     Social History: Social History   Socioeconomic History   Marital status: Single    Spouse name: Not on file   Number of children: 0   Years of education: Not on file   Highest education level: Not on file  Occupational History   Occupation: Retired  Tobacco Use   Smoking status: Former    Packs/day: 1.50    Years: 25.00    Pack years: 37.50    Types: Cigarettes    Quit date: 05/26/1986    Years since quitting: 34.4   Smokeless tobacco: Never  Vaping Use   Vaping Use: Never used  Substance and Sexual Activity   Alcohol use: Not Currently    Alcohol/week: 1.0 standard drink    Types: 1 Standard drinks or equivalent per week    Comment: stopped drinking    Drug use: No   Sexual activity: Not Currently  Other Topics Concern   Not on file  Social History Narrative   Homosexual. Lives alone. Not sexually active.       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, general practice)      Hobbies: bridge, formerly tennis hoping to get back, walking   Social Determinants of Radio broadcast assistant Strain: Low Risk    Difficulty of Paying Living Expenses: Not hard at [employer] Insecurity: No Food Insecurity   Worried About Charity fundraiser in the Last  Year: Never true   Arboriculturist in the Last Year: Never true  Transportation Needs: No Transportation Needs   Lack of Transportation (Medical): No   Lack of Transportation (Non-Medical): No  Physical Activity: Insufficiently Active   Days of Exercise per Week: 2 days   Minutes of Exercise per Session: 50 min  Stress: No Stress Concern Present   Feeling of Stress : Not at all  Social Connections: Socially Isolated   Frequency of Communication with Friends and Family: More than three times a week   Frequency of Social Gatherings with Friends and Family: More than three times a week   Attends Religious Services: Never   Marine scientist or Organizations: No   Attends Archivist Meetings: Never   Marital Status: Never married    Allergies:  Allergies  Allergen Reactions   Atorvastatin Other (See Comments)    Severe fatigue on 10mg  and 40mg  dosing   Nexlizet [Bempedoic Acid-Ezetimibe]     myalgias   Repatha [Evolocumab] Other (See Comments)    Leg weakness   Rosuvastatin Other (See Comments)    myalgias on 5mg  daily    Objective:    Vital Signs:   BP 90/60   Pulse 90   Wt 75.1 kg (165 lb 9.6 oz)   BMI 25.18 kg/m   Physical Exam    General:  NAD. No resp difficulty, arrived in wheelchair, frail-appearing. HEENT: Normal Neck: Supple. JVP to jaw. V waves present. Carotids 2+ bilat; no bruits. No lymphadenopathy or thryomegaly appreciated. Cor: PMI nondisplaced. Regular rate & rhythm. No  rubs, gallops or III/VI HSM LLSB Lungs: Clear Abdomen: + distended, nontender, . No hepatosplenomegaly. No bruits or masses. Good bowel sounds. Extremities: No cyanosis, clubbing, rash, 3+  bilateral LE edema, warm Neuro: alert & oriented x 3, cranial nerves grossly intact. Moves all 4 extremities w/o difficulty. Affect pleasant.  Telemetry    EKG    Labs   Basic Metabolic Panel: Recent Labs  Lab 11/09/20 1005  NA 129*  K 3.0*  CL 97*  CO2 18*  GLUCOSE  131*  BUN 76*  CREATININE 2.96*  CALCIUM 8.7*    Liver Function Tests: No results for input(s): AST, ALT, ALKPHOS, BILITOT, PROT, ALBUMIN in the last 168 hours. No results for input(s): LIPASE, AMYLASE in the last 168 hours. No results for input(s): AMMONIA in the last 168 hours.  CBC: Recent Labs  Lab 11/09/20 1005  WBC 6.3  HGB 7.8*  HCT 24.6*  MCV 83.7  PLT 198    Cardiac Enzymes: No results for input(s): CKTOTAL, CKMB, CKMBINDEX, TROPONINI in the last 168 hours.  BNP: BNP (last 3 results) Recent Labs    10/23/20 1114 11/09/20 1005  BNP 2,390.9* 2,058.3*    ProBNP (last 3 results) No results for input(s): PROBNP in the last 8760 hours.   CBG: No results for input(s): GLUCAP in the last 168 hours.  Coagulation Studies: No results for input(s): LABPROT, INR in the last 72 hours.  Imaging: No results found.   Patient Profile    Assessment/Plan   1. Acute on chronic systolic CHF: Echo 6/07 with EF 30-35%, mild to moderately decreased RV systolic function in setting of atrial fibrillation with RVR.  Echo in 9/19 with patient in NSR showed EF up to 50%, mild diffuse hypokinesis.  Back in atrial fibrillation in 2/20, TEE showed EF 35-40%.  Suspect he has atrial fibrillation/tachycardia-mediated cardiomyopathy given fall in EF with recurrent atrial fibrillation.  Echo in 7/20 with EF 20-25%, moderately decreased RV systolic function.  He is now s/p Medtronic CRT-P placement with AV nodal ablation. Echo in 2/21 showed EF 35-40% (some improvement), echo 5/22 showed EF 35-40% with mild RV dysfunction.  NYHA class IIIb-IV symptoms. He is volume overloaded by exam and Optivol.  BP has been low. Weight stable. - Place Unna boots. - Start lasix gtt 20 mg/hr. If he is unable to diurese, will need RHC with possible inotropic support.  - Decrease carvedilol to 6.25 to allow more BP room for diuresis. - Stop torsemide, Veltassa, and spiro. - Off Entresto. - Off Jardiance  (did not tolerate). - Follow BMETs. 2. Atrial fibrillation: Persistent.  Difficult to control rate and rhythm.  He failed Tikosyn.  He started on amiodarone, then had atrial fibrillation ablation in 5/19 with ERAF, requiring DCCV.  Most recently cardioverted in 2/20 but back in atrial fibrillation by 3/20 and has stayed in atrial fibrillation since.  He saw [name] and they decided against redo ablation. He is now s/p AV nodal ablation.    - He is off amiodarone given permanent atrial fibrillation.   - Continue Xarelto 15 mg daily (renal dosing) and carvedilol. No bleeding issues.  3. CAD: Extensive coronary calcification on last CT chest.  No chest pain. I suspect that his cardiomyopathy is tachycardia-mediated/atrial fibrillation-related given rise in EF in NSR and then fall when back in atrial fibrillation rather than ischemic cardiomyopathy. - No ASA with Xarelto use. - Unable to tolerate statins, Repatha, Zetia or Nexlizet.  He is now waiting to get inclisiran  approval.    4. AKI on CKD Stage 3b: Stop torsemide and spiro as above. Off ARNI and SGLT2i. - Creatinine now 2.96. Trend BMETs. 5. Aortic valve disease: TEE in 2/20 showed that aortic valve was not bicuspid (though TTE reports have read bicuspid) but did have mild stenosis and moderate AI.  Moderate AI on 7/20 echo.  Echo in 2/21 showed mild-moderate AI, moderate AS.  Echo in 5/22 with moderate AS and moderate AI. 6. Tricuspid regurgitation: Severe on echo 5/22.  May improve with diuresis as markedly volume overloaded.   7. LBBB: Chronic, now s/p CRT.  8. Hyperlipidemia: Unable to tolerate statins, Zetia, Nexlizet, or Repatha.   - Awaiting inclisiran. 9. Carotid stenosis: Repeat carotid dopplers in 6/22. 10. Subclavian stenosis: Suspect left subclavian stenosis.  Not noted on carotid dopplers, but large pulse differential between the arm and difficult to palpate left radial pulse.  Seems asymptomatic. - Medical management, control  lipids. - Take BP in right arm.  11. Anemia: Hgb 12.8 (11/21) down to 7.8 today. Denies overt bleeding. May need FOBT. - Repeat CBC tomorrow; check ferratin, TIBC and Iron.   [place], FNP-BC 11/09/2020, 1:15 PM  Advanced Heart Failure Team Pager [phone] (M-F; 7a - 5p)  Please contact [facility] for night-coverage after hours (4p -7a ) and weekends on [email or web address]    Patient seen and examined with the above-signed Advanced Practice Provider and/or Housestaff. I personally reviewed laboratory data, imaging studies and relevant notes. I independently examined the patient and formulated the important aspects of the plan. I have edited the note to reflect any of my changes or salient points. I have personally discussed the plan with the patient and/or family.  80 y/o male with CKD 3b, chronic systolic HF EF 36-46% with mild RV dysfunction, permanent AF s/p AVN ablation and CRT-D, mild AS/mod AI and severe TR.   Presented to clinci today with 3-4+ LE edema not responding to increasing diuretics with rising SCr.. Minimal response to IV diuretics in clinic. Admitted for IV diuresis   REDS 38%  General:  Elderly male No resp difficulty HEENT: normal Neck: supple. JVP 9-10 with prominent V waves . Carotids 2+ bilat; no bruits. No lymphadenopathy or thryomegaly appreciated. Cor: PMI nondisplaced. Regular rate & rhythm. 2/6 SEM LSB  Lungs: clear Abdomen: soft, nontender, nondistended. No hepatosplenomegaly. No bruits or masses. Good bowel sounds. Extremities: no cyanosis, clubbing, rash, 3-4+ edema Neuro: alert & orientedx3, cranial nerves grossly intact. moves all 4 extremities w/o difficulty. Affect pleasant   Admit for IV diuresis. Place UNNA boots. If no response will need RHC and possible milrinone support.   Glori Bickers, MD  3:10 PM

## 2020-11-09 NOTE — Patient Instructions (Signed)
DECREASE Coreg to 9.375 mg, one and one half tab twice a day RESTART Torsemide 60 mg daily on 11/10/2020  Labs today We will only contact you if something comes back abnormal or we need to make some changes. Otherwise no news is good news!  Your physician recommends that you schedule a follow-up appointment in: 3-4 days for recheck    At the [facility], you and your health needs are our priority. As part of our continuing mission to provide you with exceptional heart care, we have created designated Provider Care Teams. These Care Teams include your primary Cardiologist (physician) and Advanced Practice Providers (APPs- Physician Assistants and Nurse Practitioners) who all work together to provide you with the care you need, when you need it.   You may see any of the following providers on your designated Care Team at your next follow up: Dr Glori Bickers Dr [name] Dr Patrice Paradise, NP Lyda Jester, [place] Ginnie Smart Audry Riles, PharmD   Please be sure to bring in all your medications bottles to every appointment.   If you have any questions or concerns before your next appointment please send [place] a message through [place] or call our office at [phone].    TO LEAVE A MESSAGE FOR THE NURSE SELECT OPTION 2, PLEASE LEAVE A MESSAGE INCLUDING: YOUR NAME DATE OF BIRTH CALL BACK NUMBER REASON FOR CALL**this is important as we prioritize the call backs  YOU WILL RECEIVE A CALL BACK THE SAME DAY AS LONG AS YOU CALL BEFORE 4:00 PM

## 2020-11-09 NOTE — Progress Notes (Signed)
Patient received Lasix 80 mg iv as ordered.Pt tolerated medication administration.Urinal provided.

## 2020-11-09 NOTE — Progress Notes (Signed)
Patient  voided 200 cc clear yellow urine

## 2020-11-09 NOTE — Progress Notes (Signed)
Vs as charted, pt voided 150 cc clear yellow urine. Patient without any complaints.Awaiting bed on 3 east.

## 2020-11-09 NOTE — TOC Progression Note (Signed)
Transition of Care [facility]) - Progression Note    Patient Details  Name: MAJOR SANTERRE MRN: [id number] Date of Birth: 09-Jul-1940  [facility] of [facility]) CM/SW Contact  Zenon Mayo, RN Phone Number: 11/09/2020, 3:29 PM  Clinical Narrative:    NCM spoke with patient, he lives alone, he has no issues with getting his medications, he has a scale and a bp cuff at home. He states he will have transportation at discharge.  TOC will continue to follow for dc needs.        Expected Discharge Plan and Services                                                 Social Determinants of Health (SDOH) Interventions    Readmission Risk Interventions No flowsheet data found.

## 2020-11-09 NOTE — Addendum Note (Signed)
Encounter addended by: Stanford Scotland, RN on: 11/09/2020 1:25 PM  Actions taken: Clinical Note Signed, Vitals modified

## 2020-11-10 ENCOUNTER — Inpatient Hospital Stay (HOSPITAL_COMMUNITY): Payer: Medicare Other

## 2020-11-10 DIAGNOSIS — D509 Iron deficiency anemia, unspecified: Secondary | ICD-10-CM

## 2020-11-10 LAB — MAGNESIUM: Magnesium: 2.7 mg/dL — ABNORMAL HIGH (ref 1.7–2.4)

## 2020-11-10 LAB — CBC
HCT: 23.4 % — ABNORMAL LOW (ref 39.0–52.0)
Hemoglobin: 7.7 g/dL — ABNORMAL LOW (ref 13.0–17.0)
MCH: 26.9 pg (ref 26.0–34.0)
MCHC: 32.9 g/dL (ref 30.0–36.0)
MCV: 81.8 fL (ref 80.0–100.0)
Platelets: 196 10*3/uL (ref 150–400)
RBC: 2.86 MIL/uL — ABNORMAL LOW (ref 4.22–5.81)
RDW: 18.1 % — ABNORMAL HIGH (ref 11.5–15.5)
WBC: 6.4 10*3/uL (ref 4.0–10.5)
nRBC: 1.7 % — ABNORMAL HIGH (ref 0.0–0.2)

## 2020-11-10 LAB — BASIC METABOLIC PANEL
Anion gap: 14 (ref 5–15)
BUN: 74 mg/dL — ABNORMAL HIGH (ref 8–23)
CO2: 20 mmol/L — ABNORMAL LOW (ref 22–32)
Calcium: 8.7 mg/dL — ABNORMAL LOW (ref 8.9–10.3)
Chloride: 95 mmol/L — ABNORMAL LOW (ref 98–111)
Creatinine, Ser: 2.47 mg/dL — ABNORMAL HIGH (ref 0.61–1.24)
GFR, Estimated: 26 mL/min — ABNORMAL LOW (ref >60)
Glucose, Bld: 150 mg/dL — ABNORMAL HIGH (ref 70–99)
Potassium: 3.1 mmol/L — ABNORMAL LOW (ref 3.5–5.1)
Sodium: 129 mmol/L — ABNORMAL LOW (ref 135–145)

## 2020-11-10 MED ORDER — SODIUM CHLORIDE 0.9 % IV SOLN
510.0000 mg | Freq: Once | INTRAVENOUS | Status: AC
  Administered 2020-11-10: 510 mg via INTRAVENOUS
  Filled 2020-11-10: qty 17

## 2020-11-10 MED ORDER — POTASSIUM CHLORIDE CRYS ER 20 MEQ PO TBCR
40.0000 meq | EXTENDED_RELEASE_TABLET | ORAL | Status: AC
  Administered 2020-11-10 (×2): 40 meq via ORAL
  Filled 2020-11-10 (×2): qty 2

## 2020-11-10 NOTE — Progress Notes (Signed)
Advanced Heart Failure Rounding Note   Subjective:    Admitted yesterday for marked volume overload and AKI with R>L heart failure.   On lasix gtt at 20. Out 2.5L. Weight down 1 pound. Creatinine improving 3.0 -> 2.5  Unna boots placed.   Feeling much better. Denies CP, SOB, orthopnea or PND. Worried about his anemia   Objective:   Weight Range:  Vital Signs:   Temp:  [97.6 F (36.4 C)-98.5 F (36.9 C)] 98.1 F (36.7 C) (06/18 0700) Pulse Rate:  [63-93] 93 (06/18 0700) Resp:  [14-20] 20 (06/18 0700) BP: (90-111)/(50-76) 98/58 (06/18 0700) SpO2:  [93 %-100 %] 99 % (06/18 0700) Weight:  [72.7 kg-73.3 kg] 72.7 kg (06/18 0400) Last BM Date: 11/08/20  Weight change: Filed Weights   11/09/20 1412 11/09/20 1435 11/10/20 0400  Weight: 73.3 kg 73.3 kg 72.7 kg    Intake/Output:   Intake/Output Summary (Last 24 hours) at 11/10/2020 1223 Last data filed at 11/10/2020 1200 Gross per 24 hour  Intake 637.48 ml  Output 2625 ml  Net -1987.52 ml     Physical Exam: General:  Sitting up in bed No resp difficulty HEENT: normal Neck: supple. JVP 9-10. Carotids 2+ bilat; no bruits. No lymphadenopathy or thryomegaly appreciated. Cor: PMI nondisplaced. Irregular rate & rhythm. 3/6 SEM LSB Lungs: clear Abdomen: soft, nontender, nondistended. No hepatosplenomegaly. No bruits or masses. Good bowel sounds. Extremities: no cyanosis, clubbing, rash, 2+ edema + UNNA Neuro: alert & orientedx3, cranial nerves grossly intact. moves all 4 extremities w/o difficulty. Affect pleasant  Telemetry: AF 90s Personally reviewed  Labs: Basic Metabolic Panel: Recent Labs  Lab 11/09/20 1005 11/09/20 1942 11/10/20 0152  NA 129*  --  129*  K 3.0*  --  3.1*  CL 97*  --  95*  CO2 18*  --  20*  GLUCOSE 131*  --  150*  BUN 76*  --  74*  CREATININE 2.96*  --  2.47*  CALCIUM 8.7*  --  8.7*  MG  --  1.4* 2.7*    Liver Function Tests: No results for input(s): AST, ALT, ALKPHOS, BILITOT, PROT,  ALBUMIN in the last 168 hours. No results for input(s): LIPASE, AMYLASE in the last 168 hours. No results for input(s): AMMONIA in the last 168 hours.  CBC: Recent Labs  Lab 11/09/20 1005 11/10/20 0152  WBC 6.3 6.4  HGB 7.8* 7.7*  HCT 24.6* 23.4*  MCV 83.7 81.8  PLT 198 196    Cardiac Enzymes: No results for input(s): CKTOTAL, CKMB, CKMBINDEX, TROPONINI in the last 168 hours.  BNP: BNP (last 3 results) Recent Labs    10/23/20 1114 11/09/20 1005  BNP 2,390.9* 2,058.3*    ProBNP (last 3 results) No results for input(s): PROBNP in the last 8760 hours.    Other results:  Imaging: DG Chest 2 View  Result Date: 11/10/2020 CLINICAL DATA:  Congestive heart failure. EXAM: CHEST - 2 VIEW COMPARISON:  09/11/2020 FINDINGS: Stable mild cardiomegaly. Pacemaker remains in appropriate position. Aortic atherosclerotic calcification noted. Both lungs remain clear. No evidence of pleural effusion. IMPRESSION: Stable mild cardiomegaly. No active lung disease. Electronically Signed   By: Marlaine Hind M.D.   On: 11/10/2020 09:18     Medications:     Scheduled Medications:  pantoprazole  40 mg Oral BID   potassium chloride  40 mEq Oral Q4H   rivaroxaban  15 mg Oral Q supper   sodium chloride flush  3 mL Intravenous Q12H  Infusions:  sodium chloride     furosemide (LASIX) 200 mg in dextrose 5% 100 mL (2mg /mL) infusion 20 mg/hr (11/10/20 0958)    PRN Medications: sodium chloride, acetaminophen, sodium chloride flush   Assessment/Plan:   1. Acute on chronic systolic CHF: Echo 9/16 with EF 30-35%, mild to moderately decreased RV systolic function in setting of atrial fibrillation with RVR.  Echo in 9/19 with patient in NSR showed EF up to 50%, mild diffuse hypokinesis.  Back in atrial fibrillation in 2/20, TEE showed EF 35-40%.  Suspect he has atrial fibrillation/tachycardia-mediated cardiomyopathy given fall in EF with recurrent atrial fibrillation.  Echo in 7/20 with EF 20-25%,  moderately decreased RV systolic function.  He is now s/p Medtronic CRT-P placement with AV nodal ablation. Echo in 2/21 showed EF 35-40% (some improvement), echo 5/22 showed EF 35-40% with mild RV dysfunction.  NYHA class IIIb-IV symptoms.  - Admitted 6/17 with massive volume overload and R>L HF - On lasix gtt at 20. Out 2.5L. Weight down 1 pound. Creatinine improving. Increase lasix to 30.hr - Supp K - Continue  Unna boots. - Decreased carvedilol to 6.25 to allow more BP room for diuresis. - Torsemide, Veltassa, and spiro stopped on admit. - Off Entresto. - Off Jardiance (did not tolerate). 2. Atrial fibrillation: Persistent.  Difficult to control rate and rhythm.  He failed Tikosyn.  He started on amiodarone, then had atrial fibrillation ablation in 5/19 with ERAF, requiring DCCV.  Most recently cardioverted in 2/20 but back in atrial fibrillation by 3/20 and has stayed in atrial fibrillation since.  He saw [name] and they decided against redo ablation. He is now s/p AV nodal ablation.    - He is off amiodarone given permanent atrial fibrillation.   - Continue Xarelto 15 mg daily (renal dosing) and carvedilol. No bleeding issues. 3. CAD: Extensive coronary calcification on last CT chest.  No chest pain. I suspect that his cardiomyopathy is tachycardia-mediated/atrial fibrillation-related given rise in EF in NSR and then fall when back in atrial fibrillation rather than ischemic cardiomyopathy. - No s/s of ischemia  - No ASA with Xarelto use. - Unable to tolerate statins, Repatha, Zetia or Nexlizet.  He is now waiting to get inclisiran approval.    4. AKI on CKD Stage 3b: Stop torsemide and spiro as above. Off ARNI and SGLT2i. - baseline creatinine ~1.8 - Creatinine 2.96 on admit down to 2.5 with diuresis - Daily BMET 5. Aortic valve disease: TEE in 2/20 showed that aortic valve was not bicuspid (though TTE reports have read bicuspid) but did have mild stenosis and moderate AI.  Moderate AI  on 7/20 echo.  Echo in 2/21 showed mild-moderate AI, moderate AS.  Echo in 5/22 with moderate AS and moderate AI. 6. Tricuspid regurgitation: Severe on echo 5/22.  May improve with diuresis as markedly volume overloaded.   7. LBBB: Chronic, now s/p CRT.  8. Hyperlipidemia: Unable to tolerate statins, Zetia, Nexlizet, or Repatha.   - Awaiting inclisiran. 9. Carotid stenosis: Repeat carotid dopplers in 6/22. 10. Subclavian stenosis: Suspect left subclavian stenosis.  Not noted on carotid dopplers, but large pulse differential between the arm and difficult to palpate left radial pulse.  Seems asymptomatic. - Medical management, control lipids. - Take BP in right arm.  11. Anemia, iron deficient: Hgb 12.8 (11/21) down to 7.7 today. Denies overt bleeding.  -  give feraheme - transfuse hgb < 7.5  12. Hypokalemia  - supp    Length of Stay:  1   Glori Bickers MD 11/10/2020, 12:23 PM  Advanced Heart Failure Team Pager [phone] (M-F; [place])  Please contact [facility] for night-coverage after hours (4p -7a ) and weekends on [email or web address]

## 2020-11-11 ENCOUNTER — Other Ambulatory Visit (HOSPITAL_COMMUNITY): Payer: Self-pay | Admitting: Cardiology

## 2020-11-11 LAB — CBC
HCT: 24.3 % — ABNORMAL LOW (ref 39.0–52.0)
Hemoglobin: 7.9 g/dL — ABNORMAL LOW (ref 13.0–17.0)
MCH: 26.6 pg (ref 26.0–34.0)
MCHC: 32.5 g/dL (ref 30.0–36.0)
MCV: 81.8 fL (ref 80.0–100.0)
Platelets: 189 K/uL (ref 150–400)
RBC: 2.97 MIL/uL — ABNORMAL LOW (ref 4.22–5.81)
RDW: 18.8 % — ABNORMAL HIGH (ref 11.5–15.5)
WBC: 8 K/uL (ref 4.0–10.5)
nRBC: 6.6 % — ABNORMAL HIGH (ref 0.0–0.2)

## 2020-11-11 LAB — TYPE AND SCREEN
ABO/RH(D): O POS
Antibody Screen: NEGATIVE

## 2020-11-11 LAB — COMPREHENSIVE METABOLIC PANEL WITH GFR
ALT: 54 U/L — ABNORMAL HIGH (ref 0–44)
AST: 43 U/L — ABNORMAL HIGH (ref 15–41)
Albumin: 3.6 g/dL (ref 3.5–5.0)
Alkaline Phosphatase: 136 U/L — ABNORMAL HIGH (ref 38–126)
Anion gap: 13 (ref 5–15)
BUN: 59 mg/dL — ABNORMAL HIGH (ref 8–23)
CO2: 22 mmol/L (ref 22–32)
Calcium: 8.8 mg/dL — ABNORMAL LOW (ref 8.9–10.3)
Chloride: 94 mmol/L — ABNORMAL LOW (ref 98–111)
Creatinine, Ser: 2.02 mg/dL — ABNORMAL HIGH (ref 0.61–1.24)
GFR, Estimated: 33 mL/min — ABNORMAL LOW
Glucose, Bld: 149 mg/dL — ABNORMAL HIGH (ref 70–99)
Potassium: 4.5 mmol/L (ref 3.5–5.1)
Sodium: 129 mmol/L — ABNORMAL LOW (ref 135–145)
Total Bilirubin: 1.3 mg/dL — ABNORMAL HIGH (ref 0.3–1.2)
Total Protein: 6.5 g/dL (ref 6.5–8.1)

## 2020-11-11 LAB — BASIC METABOLIC PANEL
Anion gap: 14 (ref 5–15)
BUN: 65 mg/dL — ABNORMAL HIGH (ref 8–23)
CO2: 22 mmol/L (ref 22–32)
Calcium: 8.9 mg/dL (ref 8.9–10.3)
Chloride: 96 mmol/L — ABNORMAL LOW (ref 98–111)
Creatinine, Ser: 2.15 mg/dL — ABNORMAL HIGH (ref 0.61–1.24)
GFR, Estimated: 30 mL/min — ABNORMAL LOW (ref >60)
Glucose, Bld: 158 mg/dL — ABNORMAL HIGH (ref 70–99)
Potassium: 3.7 mmol/L (ref 3.5–5.1)
Sodium: 132 mmol/L — ABNORMAL LOW (ref 135–145)

## 2020-11-11 LAB — MAGNESIUM: Magnesium: 2.2 mg/dL (ref 1.7–2.4)

## 2020-11-11 LAB — LACTIC ACID, PLASMA: Lactic Acid, Venous: 2.7 mmol/L (ref 0.5–1.9)

## 2020-11-11 LAB — TSH: TSH: 1.607 u[IU]/mL (ref 0.350–4.500)

## 2020-11-11 MED ORDER — POTASSIUM CHLORIDE CRYS ER 20 MEQ PO TBCR
40.0000 meq | EXTENDED_RELEASE_TABLET | Freq: Once | ORAL | Status: AC
  Administered 2020-11-11: 40 meq via ORAL
  Filled 2020-11-11: qty 2

## 2020-11-11 MED ORDER — SODIUM CHLORIDE 0.9 % IV SOLN: INTRAVENOUS | Status: DC

## 2020-11-11 MED ORDER — ASPIRIN 81 MG PO CHEW
81.0000 mg | CHEWABLE_TABLET | ORAL | Status: AC
  Administered 2020-11-12: 81 mg via ORAL
  Filled 2020-11-11: qty 1

## 2020-11-11 MED ORDER — SODIUM CHLORIDE 0.9% FLUSH
3.0000 mL | Freq: Two times a day (BID) | INTRAVENOUS | Status: DC
  Administered 2020-11-11 – 2020-11-12 (×2): 3 mL via INTRAVENOUS

## 2020-11-11 MED ORDER — SODIUM CHLORIDE 0.9% FLUSH: 3.0000 mL | INTRAVENOUS | Status: DC | PRN

## 2020-11-11 MED ORDER — AMIODARONE HCL IN DEXTROSE 360-4.14 MG/200ML-% IV SOLN
60.0000 mg/h | INTRAVENOUS | Status: AC
  Administered 2020-11-11: 60 mg/h via INTRAVENOUS
  Filled 2020-11-11 (×3): qty 200

## 2020-11-11 MED ORDER — CHLORHEXIDINE GLUCONATE CLOTH 2 % EX PADS
6.0000 | MEDICATED_PAD | Freq: Every day | CUTANEOUS | Status: DC
  Administered 2020-11-12 – 2020-11-13 (×2): 6 via TOPICAL

## 2020-11-11 MED ORDER — AMIODARONE HCL IN DEXTROSE 360-4.14 MG/200ML-% IV SOLN
30.0000 mg/h | INTRAVENOUS | Status: DC
  Administered 2020-11-12 – 2020-11-14 (×6): 30 mg/h via INTRAVENOUS
  Filled 2020-11-11 (×4): qty 200

## 2020-11-11 MED ORDER — AMIODARONE LOAD VIA INFUSION
150.0000 mg | Freq: Once | INTRAVENOUS | Status: AC
  Administered 2020-11-11: 150 mg via INTRAVENOUS
  Filled 2020-11-11: qty 83.34

## 2020-11-11 MED ORDER — SODIUM CHLORIDE 0.9 % IV SOLN: 250.0000 mL | INTRAVENOUS | Status: DC | PRN

## 2020-11-11 NOTE — Progress Notes (Signed)
Reviewed telemetry and patient went into monomorphic VT at 250 from paced rhythm. Was having frequent PVCs before. Review of telemetry with another shorter episode of monomorphic VT earlier today at 13:55. Stopped lasix, awaiting labs to result back. Tx to 2H05 for closer monitoring and amio load. Travis [name] felt lightheaded and then had acute LOC but no preceding anginal equivalents. Was resting in bed when it happened.

## 2020-11-11 NOTE — Plan of Care (Signed)
  Problem: Education: Goal: Ability to demonstrate management of disease process will improve Outcome: Progressing Goal: Ability to verbalize understanding of medication therapies will improve Outcome: Progressing Goal: Individualized Educational Video(s) Outcome: Progressing   Problem: Education: Goal: Understanding of CV disease, CV risk reduction, and recovery process will improve Outcome: Progressing Goal: Individualized Educational Video(s) Outcome: Progressing   Problem: Activity: Goal: Ability to return to baseline activity level will improve Outcome: Progressing

## 2020-11-11 NOTE — Significant Event (Signed)
Rapid Response Event Note   Reason for Call :  Making rounds and requested to come into room due to patient having frequent runs of vtach and episode of seizure like activity  Initial Focused Assessment:  At baseline A&O x 4, HD stable with bp 107/66  Reviewed strips and had multiple runs of vtach one lasting as long as ~1.90mins  Denies SOB, CP or any other complaints  Interventions:  O2 2L [place] Placed on zoll pads Primary RN to contact Cards Plan of Care:  Transfer to [place] and started on Amio drip   Event Summary:   MD Notified: 2024 Call Time: 2006 Arrival Time: 2010 End Time: 2034  Charlyne Quale, RN

## 2020-11-11 NOTE — Progress Notes (Signed)
Advanced Heart Failure Rounding Note   Subjective:    Admitted for marked volume overload and AKI with R>L heart failure.   On lasix gtt at 30 (increased yesterday) . Out 2.3L. Weight down 4 pounds. Creatinine improving 3.0 -> 2.5 -> 2.2   Feeling much better. Denies CP, SOB, orthopnea or PND. Hgb 7.7  Iron stores low.   Objective:   Weight Range:  Vital Signs:   Temp:  [97.6 F (36.4 C)-98 F (36.7 C)] 97.6 F (36.4 C) (06/19 0756) Pulse Rate:  [48-81] 65 (06/19 0756) Resp:  [18-20] 20 (06/19 0756) BP: (101-107)/(57-85) 103/70 (06/19 0756) SpO2:  [95 %-100 %] 100 % (06/19 0756) Weight:  [71.2 kg] 71.2 kg (06/19 0418) Last BM Date: 11/10/20  Weight change: Filed Weights   11/09/20 1435 11/10/20 0400 11/11/20 0418  Weight: 73.3 kg 72.7 kg 71.2 kg    Intake/Output:   Intake/Output Summary (Last 24 hours) at 11/11/2020 1012 Last data filed at 11/11/2020 0931 Gross per 24 hour  Intake 1399.82 ml  Output 2550 ml  Net -1150.18 ml      Physical Exam: General:  Sitting up in bed No resp difficulty HEENT: normal Neck: supple. JVP to ear with prominent v-waves Carotids 2+ bilat; no bruits. No lymphadenopathy or thryomegaly appreciated. Cor: PMI nondisplaced. Irregular rate & rhythm. 2/6 SEM LSB Lungs: clear Abdomen: soft, nontender, nondistended. No hepatosplenomegaly. No bruits or masses. Good bowel sounds. Extremities: no cyanosis, clubbing, rash, tr edema + UNNA Neuro: alert & orientedx3, cranial nerves grossly intact. moves all 4 extremities w/o difficulty. Affect pleasant  Telemetry: AF 60-70s Personally reviewed  Labs: Basic Metabolic Panel: Recent Labs  Lab 11/09/20 1005 11/09/20 1942 11/10/20 0152 11/11/20 0341  NA 129*  --  129* 132*  K 3.0*  --  3.1* 3.7  CL 97*  --  95* 96*  CO2 18*  --  20* 22  GLUCOSE 131*  --  150* 158*  BUN 76*  --  74* 65*  CREATININE 2.96*  --  2.47* 2.15*  CALCIUM 8.7*  --  8.7* 8.9  MG  --  1.4* 2.7*  --       Liver Function Tests: No results for input(s): AST, ALT, ALKPHOS, BILITOT, PROT, ALBUMIN in the last 168 hours. No results for input(s): LIPASE, AMYLASE in the last 168 hours. No results for input(s): AMMONIA in the last 168 hours.  CBC: Recent Labs  Lab 11/09/20 1005 11/10/20 0152  WBC 6.3 6.4  HGB 7.8* 7.7*  HCT 24.6* 23.4*  MCV 83.7 81.8  PLT 198 196     Cardiac Enzymes: No results for input(s): CKTOTAL, CKMB, CKMBINDEX, TROPONINI in the last 168 hours.  BNP: BNP (last 3 results) Recent Labs    10/23/20 1114 11/09/20 1005  BNP 2,390.9* 2,058.3*     ProBNP (last 3 results) No results for input(s): PROBNP in the last 8760 hours.    Other results:  Imaging: DG Chest 2 View  Result Date: 11/10/2020 CLINICAL DATA:  Congestive heart failure. EXAM: CHEST - 2 VIEW COMPARISON:  09/11/2020 FINDINGS: Stable mild cardiomegaly. Pacemaker remains in appropriate position. Aortic atherosclerotic calcification noted. Both lungs remain clear. No evidence of pleural effusion. IMPRESSION: Stable mild cardiomegaly. No active lung disease. Electronically Signed   By: Marlaine Hind M.D.   On: 11/10/2020 09:18     Medications:     Scheduled Medications:  pantoprazole  40 mg Oral BID   rivaroxaban  15 mg Oral Q supper  sodium chloride flush  3 mL Intravenous Q12H    Infusions:  sodium chloride     furosemide (LASIX) 200 mg in dextrose 5% 100 mL (2mg /mL) infusion 30 mg/hr (11/11/20 0616)    PRN Medications: sodium chloride, acetaminophen, sodium chloride flush   Assessment/Plan:   1. Acute on chronic systolic CHF: Echo 0/10 with EF 30-35%, mild to moderately decreased RV systolic function in setting of atrial fibrillation with RVR.  Echo in 9/19 with patient in NSR showed EF up to 50%, mild diffuse hypokinesis.  Back in atrial fibrillation in 2/20, TEE showed EF 35-40%.  Suspect he has atrial fibrillation/tachycardia-mediated cardiomyopathy given fall in EF with  recurrent atrial fibrillation.  Echo in 7/20 with EF 20-25%, moderately decreased RV systolic function.  He is now s/p Medtronic CRT-P placement with AV nodal ablation. Echo in 2/21 showed EF 35-40% (some improvement), echo 5/22 showed EF 35-40% with mild RV dysfunction.  NYHA class IIIb-IV symptoms on admit.  - Admitted 6/17 with massive volume overload and R>L HF - On lasix gtt at 30. Out 2.5L. Weight down 5 pounds so far. Creatinine improving. - Supp K - Peripherally, volume status looks good but neck veins still way up. Urine output has slowed. Will plan RHC tomorrow am to further assess.  - Continue  Unna boots. - Decreased carvedilol to 6.25 to allow more BP room for diuresis. - Torsemide, Veltassa, and spiro stopped on admit. - Off Entresto. - Off Jardiance (did not tolerate). 2. Atrial fibrillation: Persistent.  Difficult to control rate and rhythm.  He failed Tikosyn.  He started on amiodarone, then had atrial fibrillation ablation in 5/19 with ERAF, requiring DCCV.  Most recently cardioverted in 2/20 but back in atrial fibrillation by 3/20 and has stayed in atrial fibrillation since.  He saw [name] and they decided against redo ablation. He is now s/p AV nodal ablation.    - He is off amiodarone given permanent atrial fibrillation.   - Continue Xarelto 15 mg daily (renal dosing) and carvedilol. No bleeding issues. 3. CAD: Extensive coronary calcification on last CT chest.  No chest pain. I suspect that his cardiomyopathy is tachycardia-mediated/atrial fibrillation-related given rise in EF in NSR and then fall when back in atrial fibrillation rather than ischemic cardiomyopathy. - No s/s ischemia  - No ASA with Xarelto use. - Unable to tolerate statins, Repatha, Zetia or Nexlizet.  He is now waiting to get inclisiran approval.    4. AKI on CKD Stage 3b: Off torsemide and spiro as above. Off ARNI and SGLT2i. - baseline creatinine ~1.8 - Creatinine 2.96 on admit down to 2.2 with  diuresis - Daily BMET 5. Aortic valve disease: TEE in 2/20 showed that aortic valve was not bicuspid (though TTE reports have read bicuspid) but did have mild stenosis and moderate AI.  Moderate AI on 7/20 echo.  Echo in 2/21 showed mild-moderate AI, moderate AS.  Echo in 5/22 with moderate AS and moderate AI. 6. Tricuspid regurgitation: Severe on echo 5/22.  May improve with diuresis as markedly volume overloaded.   7. LBBB: Chronic, now s/p CRT.  8. Hyperlipidemia: Unable to tolerate statins, Zetia, Nexlizet, or Repatha.   - Awaiting inclisiran. 9. Carotid stenosis: Repeat carotid dopplers in 6/22. 10. Subclavian stenosis: Suspect left subclavian stenosis.  Not noted on carotid dopplers, but large pulse differential between the arm and difficult to palpate left radial pulse.  Seems asymptomatic. - Medical management, control lipids. - Take BP in right arm.  11.  Anemia, iron deficient: Hgb 12.8 (11/21) down to 7.7 today. Denies overt bleeding.  -  received feraheme 6/18 - transfuse hgb < 7.5  12. Hypokalemia  - supp as above   Length of Stay: 2   Glori Bickers MD 11/11/2020, 10:12 AM  Advanced Heart Failure Team Pager [phone] (M-F; [place])  Please contact [facility] for night-coverage after hours (4p -7a ) and weekends on [email or web address]

## 2020-11-11 NOTE — Significant Event (Signed)
Paged by nursing that patient had 1.5 minute run of VT, back to bl now, HD stable and mentating.  Reviewed progress note today, patient admitted with AKI and biventricular heart failure who is currently on Lasix gtt at 30/h with good urine output and improving renal function.  Overall was doing better.  EF 30-35% with recurrent AF/RVR which was thought to be part of the cause of his heart failure.  He underwent Medtronic CRT-P in 12/2018 followed by AVNA in 01/2019. Was on amio prior to AF ablation and failed therapy before AVNA. Has not been on amio since 2020. Will obtain labs now (CMP/lactate/TSH/Mg/CBC) now and go ahead and load on amio. Nursing reported he was having "seizure like episode" while they were trying to get pads on. Will review telemetry but story c/w VT. Will interrogate device and transfer to [place] for monitoring overnight while labs return.

## 2020-11-12 ENCOUNTER — Encounter (HOSPITAL_COMMUNITY)
Admission: AD | Disposition: A | Discharge status: Home / Self Care | Payer: Self-pay | Source: Ambulatory Visit | Attending: Internal Medicine

## 2020-11-12 ENCOUNTER — Encounter (HOSPITAL_COMMUNITY): Payer: Medicare Other

## 2020-11-12 DIAGNOSIS — R570 Cardiogenic shock: Secondary | ICD-10-CM

## 2020-11-12 HISTORY — PX: RIGHT HEART CATH: CATH118263

## 2020-11-12 HISTORY — PX: CENTRAL LINE INSERTION: CATH118232

## 2020-11-12 LAB — POCT I-STAT EG7
Acid-Base Excess: 0 mmol/L (ref 0.0–2.0)
Acid-Base Excess: 1 mmol/L (ref 0.0–2.0)
Bicarbonate: 23 mmol/L (ref 20.0–28.0)
Bicarbonate: 23.6 mmol/L (ref 20.0–28.0)
Calcium, Ion: 1.11 mmol/L — ABNORMAL LOW (ref 1.15–1.40)
Calcium, Ion: 1.15 mmol/L (ref 1.15–1.40)
HCT: 26 % — ABNORMAL LOW (ref 39.0–52.0)
HCT: 26 % — ABNORMAL LOW (ref 39.0–52.0)
Hemoglobin: 8.8 g/dL — ABNORMAL LOW (ref 13.0–17.0)
Hemoglobin: 8.8 g/dL — ABNORMAL LOW (ref 13.0–17.0)
O2 Saturation: 36 %
O2 Saturation: 36 %
Potassium: 3.8 mmol/L (ref 3.5–5.1)
Potassium: 3.8 mmol/L (ref 3.5–5.1)
Sodium: 135 mmol/L (ref 135–145)
Sodium: 136 mmol/L (ref 135–145)
TCO2: 24 mmol/L (ref 22–32)
TCO2: 25 mmol/L (ref 22–32)
pCO2, Ven: 31 mmHg — ABNORMAL LOW (ref 44.0–60.0)
pCO2, Ven: 31.3 mmHg — ABNORMAL LOW (ref 44.0–60.0)
pH, Ven: 7.478 — ABNORMAL HIGH (ref 7.250–7.430)
pH, Ven: 7.486 — ABNORMAL HIGH (ref 7.250–7.430)
pO2, Ven: 19 mmHg — CL (ref 32.0–45.0)
pO2, Ven: 19 mmHg — CL (ref 32.0–45.0)

## 2020-11-12 LAB — COOXEMETRY PANEL
Carboxyhemoglobin: 1.2 % (ref 0.5–1.5)
Methemoglobin: 1.6 % — ABNORMAL HIGH (ref 0.0–1.5)
O2 Saturation: 28.2 %
Total hemoglobin: 8.6 g/dL — ABNORMAL LOW (ref 12.0–16.0)

## 2020-11-12 LAB — BASIC METABOLIC PANEL
Anion gap: 13 (ref 5–15)
BUN: 61 mg/dL — ABNORMAL HIGH (ref 8–23)
CO2: 22 mmol/L (ref 22–32)
Calcium: 9 mg/dL (ref 8.9–10.3)
Chloride: 97 mmol/L — ABNORMAL LOW (ref 98–111)
Creatinine, Ser: 1.95 mg/dL — ABNORMAL HIGH (ref 0.61–1.24)
GFR, Estimated: 34 mL/min — ABNORMAL LOW (ref >60)
Glucose, Bld: 131 mg/dL — ABNORMAL HIGH (ref 70–99)
Potassium: 4.2 mmol/L (ref 3.5–5.1)
Sodium: 132 mmol/L — ABNORMAL LOW (ref 135–145)

## 2020-11-12 LAB — POCT I-STAT 7, (LYTES, BLD GAS, ICA,H+H)
Acid-Base Excess: 1 mmol/L (ref 0.0–2.0)
Bicarbonate: 22.1 mmol/L (ref 20.0–28.0)
Calcium, Ion: 1.13 mmol/L — ABNORMAL LOW (ref 1.15–1.40)
HCT: 26 % — ABNORMAL LOW (ref 39.0–52.0)
Hemoglobin: 8.8 g/dL — ABNORMAL LOW (ref 13.0–17.0)
O2 Saturation: 99 %
Potassium: 3.8 mmol/L (ref 3.5–5.1)
Sodium: 134 mmol/L — ABNORMAL LOW (ref 135–145)
TCO2: 23 mmol/L (ref 22–32)
pCO2 arterial: 24.1 mmHg — ABNORMAL LOW (ref 32.0–48.0)
pH, Arterial: 7.571 — ABNORMAL HIGH (ref 7.350–7.450)
pO2, Arterial: 101 mmHg (ref 83.0–108.0)

## 2020-11-12 SURGERY — RIGHT HEART CATH
Anesthesia: LOCAL

## 2020-11-12 MED ORDER — LIDOCAINE HCL (PF) 1 % IJ SOLN
INTRAMUSCULAR | Status: AC
  Filled 2020-11-12: qty 30

## 2020-11-12 MED ORDER — FUROSEMIDE 10 MG/ML IJ SOLN
15.0000 mg/h | INTRAVENOUS | Status: DC
  Administered 2020-11-12 – 2020-11-14 (×4): 15 mg/h via INTRAVENOUS
  Filled 2020-11-12 (×4): qty 20

## 2020-11-12 MED ORDER — SODIUM CHLORIDE 0.9% FLUSH
3.0000 mL | Freq: Two times a day (BID) | INTRAVENOUS | Status: DC
  Administered 2020-11-12 – 2020-11-14 (×4): 3 mL via INTRAVENOUS

## 2020-11-12 MED ORDER — LABETALOL HCL 5 MG/ML IV SOLN: 10.0000 mg | INTRAVENOUS | Status: AC | PRN

## 2020-11-12 MED ORDER — HEPARIN (PORCINE) IN NACL 1000-0.9 UT/500ML-% IV SOLN
INTRAVENOUS | Status: DC | PRN
  Administered 2020-11-12: 500 mL

## 2020-11-12 MED ORDER — SODIUM CHLORIDE 0.9 % IV SOLN: 250.0000 mL | INTRAVENOUS | Status: DC | PRN

## 2020-11-12 MED ORDER — ACETAMINOPHEN 325 MG PO TABS: 650.0000 mg | ORAL_TABLET | ORAL | Status: DC | PRN

## 2020-11-12 MED ORDER — LIDOCAINE HCL (PF) 1 % IJ SOLN
INTRAMUSCULAR | Status: DC | PRN
  Administered 2020-11-12: 2 mL
  Administered 2020-11-12: 5 mL

## 2020-11-12 MED ORDER — HEPARIN (PORCINE) 25000 UT/250ML-% IV SOLN
850.0000 [IU]/h | INTRAVENOUS | Status: DC
  Administered 2020-11-12: 1000 [IU]/h via INTRAVENOUS
  Filled 2020-11-12: qty 250

## 2020-11-12 MED ORDER — MILRINONE LACTATE IN DEXTROSE 20-5 MG/100ML-% IV SOLN
0.1250 ug/kg/min | INTRAVENOUS | Status: DC
  Administered 2020-11-12 – 2020-11-14 (×3): 0.125 ug/kg/min via INTRAVENOUS
  Filled 2020-11-12 (×2): qty 100

## 2020-11-12 MED ORDER — SODIUM CHLORIDE 0.9% FLUSH: 3.0000 mL | INTRAVENOUS | Status: DC | PRN

## 2020-11-12 MED ORDER — ONDANSETRON HCL 4 MG/2ML IJ SOLN: 4.0000 mg | Freq: Four times a day (QID) | INTRAMUSCULAR | Status: DC | PRN

## 2020-11-12 MED ORDER — HYDRALAZINE HCL 20 MG/ML IJ SOLN: 10.0000 mg | INTRAMUSCULAR | Status: AC | PRN

## 2020-11-12 SURGICAL SUPPLY — 9 items
CATH BALLN WEDGE 5F 110CM (CATHETERS) ×1 IMPLANT
GUIDEWIRE .025 260CM (WIRE) ×1 IMPLANT
KIT CV 3L 7FR 20CM SULFAFREE (SET/KITS/TRAYS/PACK) ×1 IMPLANT
KIT MICROPUNCTURE NIT STIFF (SHEATH) ×1 IMPLANT
PACK CARDIAC CATHETERIZATION (CUSTOM PROCEDURE TRAY) ×3 IMPLANT
SHEATH GLIDE SLENDER 4/5FR (SHEATH) ×1 IMPLANT
SHEATH PROBE COVER 6X72 (BAG) ×2 IMPLANT
TRANSDUCER W/STOPCOCK (MISCELLANEOUS) ×3 IMPLANT
TUBING ART PRESS 72  MALE/MALE (TUBING) ×1 IMPLANT

## 2020-11-12 NOTE — H&P (View-Only) (Signed)
Advanced Heart Failure Rounding Note   Subjective:    Admitted for marked volume overload and AKI with R>L heart failure. 6/17.  Carvedilol ordered but never given due to hypotension. He has not received any carvedilol.   6/18 given iron feraheme.  6/19 Dizzy then had NSVT/VT started on amio drip and lasix drip was stopped. Moved to ICU.  Lasix drip stopped.   Weight unchanged 156 pounds.   Creatinine improving 3.0 -> 2.5 -> 2.2 ->2   Says he feels fine. Wants to know when he can eat.   Objective:   Weight Range:  Vital Signs:   Temp:  [97.3 F (36.3 C)-97.9 F (36.6 C)] 97.6 F (36.4 C) (06/20 0700) Pulse Rate:  [56-86] 58 (06/20 0900) Resp:  [13-34] 20 (06/20 0900) BP: (92-114)/(58-70) 99/64 (06/20 0800) SpO2:  [95 %-100 %] 99 % (06/20 0800) Weight:  [71.2 kg] 71.2 kg (06/19 1944) Last BM Date: 11/10/20  Weight change: Filed Weights   11/10/20 0400 11/11/20 0418 11/11/20 1944  Weight: 72.7 kg 71.2 kg 71.2 kg    Intake/Output:   Intake/Output Summary (Last 24 hours) at 11/12/2020 1016 Last data filed at 11/12/2020 0900 Gross per 24 hour  Intake 933.05 ml  Output 1250 ml  Net -316.95 ml     Physical Exam: General: In bed . No resp difficulty HEENT: normal Neck: supple. JVP 6-7. Carotids 2+ bilat; no bruits. No lymphadenopathy or thryomegaly appreciated. Cor: PMI nondisplaced. Irregular rate & rhythm. No rubs, gallops or murmurs. ZOLL Pads in place.  Lungs: clear Abdomen: soft, nontender, nondistended. No hepatosplenomegaly. No bruits or masses. Good bowel sounds. Extremities: no cyanosis, clubbing, rash, R and LLE unna boots.  Neuro: alert & orientedx3, cranial nerves grossly intact. moves all 4 extremities w/o difficulty. Affect pleasant  Telemetry: A fib 60-70s  Labs: Basic Metabolic Panel: Recent Labs  Lab 11/09/20 1005 11/09/20 1942 11/10/20 0152 11/11/20 0341 11/11/20 2117 11/12/20 0044  NA 129*  --  129* 132* 129* 132*  K 3.0*  --  3.1*  3.7 4.5 4.2  CL 97*  --  95* 96* 94* 97*  CO2 18*  --  20* 22 22 22   GLUCOSE 131*  --  150* 158* 149* 131*  BUN 76*  --  74* 65* 59* 61*  CREATININE 2.96*  --  2.47* 2.15* 2.02* 1.95*  CALCIUM 8.7*  --  8.7* 8.9 8.8* 9.0  MG  --  1.4* 2.7*  --  2.2  --     Liver Function Tests: Recent Labs  Lab 11/11/20 2117  AST 43*  ALT 54*  ALKPHOS 136*  BILITOT 1.3*  PROT 6.5  ALBUMIN 3.6   No results for input(s): LIPASE, AMYLASE in the last 168 hours. No results for input(s): AMMONIA in the last 168 hours.  CBC: Recent Labs  Lab 11/09/20 1005 11/10/20 0152 11/11/20 2117  WBC 6.3 6.4 8.0  HGB 7.8* 7.7* 7.9*  HCT 24.6* 23.4* 24.3*  MCV 83.7 81.8 81.8  PLT 198 196 189    Cardiac Enzymes: No results for input(s): CKTOTAL, CKMB, CKMBINDEX, TROPONINI in the last 168 hours.  BNP: BNP (last 3 results) Recent Labs    10/23/20 1114 11/09/20 1005  BNP 2,390.9* 2,058.3*    ProBNP (last 3 results) No results for input(s): PROBNP in the last 8760 hours.    Other results:  Imaging: No results found.   Medications:     Scheduled Medications:  Chlorhexidine Gluconate Cloth  6 each Topical  Daily   pantoprazole  40 mg Oral BID   sodium chloride flush  3 mL Intravenous Q12H   sodium chloride flush  3 mL Intravenous Q12H    Infusions:  sodium chloride     sodium chloride     sodium chloride 10 mL/hr at 11/12/20 0602   amiodarone 30 mg/hr (11/12/20 0604)   furosemide (LASIX) 200 mg in dextrose 5% 100 mL (2mg /mL) infusion Stopped (11/11/20 2050)    PRN Medications: sodium chloride, sodium chloride, acetaminophen, sodium chloride flush, sodium chloride flush   Assessment/Plan:   1. Acute on chronic systolic CHF: Echo 5/63 with EF 30-35%, mild to moderately decreased RV systolic function in setting of atrial fibrillation with RVR.  Echo in 9/19 with patient in NSR showed EF up to 50%, mild diffuse hypokinesis.  Back in atrial fibrillation in 2/20, TEE showed EF 35-40%.   Suspect he has atrial fibrillation/tachycardia-mediated cardiomyopathy given fall in EF with recurrent atrial fibrillation.  Echo in 7/20 with EF 20-25%, moderately decreased RV systolic function.  He is now s/p Medtronic CRT-P placement with AV nodal ablation. Echo in 2/21 showed EF 35-40% (some improvement), echo 5/22 showed EF 35-40% with mild RV dysfunction.  NYHA class IIIb-IV symptoms on admit.  - Admitted 6/17 with massive volume overload and R>L HF - Off lasix. RHC today to assess hemodynamics. Volume status ok.  - 6/17 ordered but never given due to hypotension. Later discontinued. He has not received any carvedilol during this hospitalization.  - Torsemide, Veltassa, and spiro stopped on admit. - Off Entresto. - Off Jardiance (did not tolerate). 2. Atrial fibrillation: Persistent.  Difficult to control rate and rhythm.  He failed Tikosyn.  He started on amiodarone, then had atrial fibrillation ablation in 5/19 with ERAF, requiring DCCV.  Most recently cardioverted in 2/20 but back in atrial fibrillation by 3/20 and has stayed in atrial fibrillation since.  He saw [name] and they decided against redo ablation. He is now s/p AV nodal ablation.   Rate controlled.   - On amio drip due to NSVT/VT. Marland Kitchen   - Xarelto on hold for cath. No bleeding issues. 3. CAD: Extensive coronary calcification on last CT chest.  No chest pain. I suspect that his cardiomyopathy is tachycardia-mediated/atrial fibrillation-related given rise in EF in NSR and then fall when back in atrial fibrillation rather than ischemic cardiomyopathy. - No chest pain.  - No ASA with Xarelto use. - Unable to tolerate statins, Repatha, Zetia or Nexlizet.  He is now waiting to get inclisiran approval.    4. AKI on CKD Stage 3b: Off torsemide and spiro as above. Off ARNI and SGLT2i. - baseline creatinine ~1.8 - Creatinine 2.96 on admit down to 2.0 today.  5. Aortic valve disease: TEE in 2/20 showed that aortic valve was not  bicuspid (though TTE reports have read bicuspid) but did have mild stenosis and moderate AI.  Moderate AI on 7/20 echo.  Echo in 2/21 showed mild-moderate AI, moderate AS.  Echo in 5/22 with moderate AS and moderate AI. 6. Tricuspid regurgitation: Severe on echo 5/22.  May improve with diuresis as markedly volume overloaded.   7. LBBB: Chronic, now s/p CRT-P.  8. Hyperlipidemia: Unable to tolerate statins, Zetia, Nexlizet, or Repatha.   - Awaiting inclisiran. 9. Carotid stenosis: Repeat carotid dopplers in 6/22. 10. Subclavian stenosis: Suspect left subclavian stenosis.  Not noted on carotid dopplers, but large pulse differential between the arm and difficult to palpate left radial pulse.  Seems  asymptomatic. - Medical management, control lipids. - Take BP in right arm.  11. Anemia, iron deficient: Hgb 12.8 (11/21) 7.9 today. Denies overt bleeding.  -  received feraheme 6/18 - transfuse hgb < 7.5  12. Hypokalemia  K 4.2  13. NSVT 6/19 had NSVT- VT. Started on amio drip.    [place] later today to further assess.   Length of Stay: 3   Amy Clegg NP-C  11/12/2020, 10:16 AM  Advanced Heart Failure Team Pager [phone] (M-F; [place])  Please contact [facility] for night-coverage after hours (4p -7a ) and weekends on [email or web address]   Patient seen and examined with the above-signed Advanced Practice Provider and/or Housestaff. I personally reviewed laboratory data, imaging studies and relevant notes. I independently examined the patient and formulated the important aspects of the plan. I have edited the note to reflect any of my changes or salient points. I have personally discussed the plan with the patient and/or family.  Developed sustained VT last night in setting of attempts at aggressive diuresis. However electrolytes ok.   VT quiescent now on IV amio. He has CRT-P in place so no ICD.  Weight stable. Creatinine improved.   General:  Well appearing. No resp difficulty HEENT:  normal Neck: supple. JVP elevated with prominent v waves . Carotids 2+ bilat; no bruits. No lymphadenopathy or thryomegaly appreciated. Cor: PMI nondisplaced. Irregular rate & rhythm. 2/6 TR Lungs: clear Abdomen: soft, nontender, nondistended. No hepatosplenomegaly. No bruits or masses. Good bowel sounds. Extremities: no cyanosis, clubbing, rash, edema Neuro: alert & orientedx3, cranial nerves grossly intact. moves all 4 extremities w/o difficulty. Affect pleasant  VT now quiescent on IV amio. Will review with EP.   Volume status looks better but neck veins still markedly elevated. Will plan RHC today. SCr improving. Hold lasix for now. Xarelto on hold for cath.   Glori Bickers, MD  1:11 PM

## 2020-11-12 NOTE — Progress Notes (Addendum)
Advanced Heart Failure Rounding Note   Subjective:    Admitted for marked volume overload and AKI with R>L heart failure. 6/17.  Carvedilol ordered but never given due to hypotension. He has not received any carvedilol.   6/18 given iron feraheme.  6/19 Dizzy then had NSVT/VT started on amio drip and lasix drip was stopped. Moved to ICU.  Lasix drip stopped.   Weight unchanged 156 pounds.   Creatinine improving 3.0 -> 2.5 -> 2.2 ->2   Says he feels fine. Wants to know when he can eat.   Objective:   Weight Range:  Vital Signs:   Temp:  [97.3 F (36.3 C)-97.9 F (36.6 C)] 97.6 F (36.4 C) (06/20 0700) Pulse Rate:  [56-86] 58 (06/20 0900) Resp:  [13-34] 20 (06/20 0900) BP: (92-114)/(58-70) 99/64 (06/20 0800) SpO2:  [95 %-100 %] 99 % (06/20 0800) Weight:  [71.2 kg] 71.2 kg (06/19 1944) Last BM Date: 11/10/20  Weight change: Filed Weights   11/10/20 0400 11/11/20 0418 11/11/20 1944  Weight: 72.7 kg 71.2 kg 71.2 kg    Intake/Output:   Intake/Output Summary (Last 24 hours) at 11/12/2020 1016 Last data filed at 11/12/2020 0900 Gross per 24 hour  Intake 933.05 ml  Output 1250 ml  Net -316.95 ml     Physical Exam: General: In bed . No resp difficulty HEENT: normal Neck: supple. JVP 6-7. Carotids 2+ bilat; no bruits. No lymphadenopathy or thryomegaly appreciated. Cor: PMI nondisplaced. Irregular rate & rhythm. No rubs, gallops or murmurs. ZOLL Pads in place.  Lungs: clear Abdomen: soft, nontender, nondistended. No hepatosplenomegaly. No bruits or masses. Good bowel sounds. Extremities: no cyanosis, clubbing, rash, R and LLE unna boots.  Neuro: alert & orientedx3, cranial nerves grossly intact. moves all 4 extremities w/o difficulty. Affect pleasant  Telemetry: A fib 60-70s  Labs: Basic Metabolic Panel: Recent Labs  Lab 11/09/20 1005 11/09/20 1942 11/10/20 0152 11/11/20 0341 11/11/20 2117 11/12/20 0044  NA 129*  --  129* 132* 129* 132*  K 3.0*  --  3.1*  3.7 4.5 4.2  CL 97*  --  95* 96* 94* 97*  CO2 18*  --  20* 22 22 22   GLUCOSE 131*  --  150* 158* 149* 131*  BUN 76*  --  74* 65* 59* 61*  CREATININE 2.96*  --  2.47* 2.15* 2.02* 1.95*  CALCIUM 8.7*  --  8.7* 8.9 8.8* 9.0  MG  --  1.4* 2.7*  --  2.2  --     Liver Function Tests: Recent Labs  Lab 11/11/20 2117  AST 43*  ALT 54*  ALKPHOS 136*  BILITOT 1.3*  PROT 6.5  ALBUMIN 3.6   No results for input(s): LIPASE, AMYLASE in the last 168 hours. No results for input(s): AMMONIA in the last 168 hours.  CBC: Recent Labs  Lab 11/09/20 1005 11/10/20 0152 11/11/20 2117  WBC 6.3 6.4 8.0  HGB 7.8* 7.7* 7.9*  HCT 24.6* 23.4* 24.3*  MCV 83.7 81.8 81.8  PLT 198 196 189    Cardiac Enzymes: No results for input(s): CKTOTAL, CKMB, CKMBINDEX, TROPONINI in the last 168 hours.  BNP: BNP (last 3 results) Recent Labs    10/23/20 1114 11/09/20 1005  BNP 2,390.9* 2,058.3*    ProBNP (last 3 results) No results for input(s): PROBNP in the last 8760 hours.    Other results:  Imaging: No results found.   Medications:     Scheduled Medications:  Chlorhexidine Gluconate Cloth  6 each Topical  Daily   pantoprazole  40 mg Oral BID   sodium chloride flush  3 mL Intravenous Q12H   sodium chloride flush  3 mL Intravenous Q12H    Infusions:  sodium chloride     sodium chloride     sodium chloride 10 mL/hr at 11/12/20 0602   amiodarone 30 mg/hr (11/12/20 0604)   furosemide (LASIX) 200 mg in dextrose 5% 100 mL (2mg /mL) infusion Stopped (11/11/20 2050)    PRN Medications: sodium chloride, sodium chloride, acetaminophen, sodium chloride flush, sodium chloride flush   Assessment/Plan:   1. Acute on chronic systolic CHF: Echo 5/18 with EF 30-35%, mild to moderately decreased RV systolic function in setting of atrial fibrillation with RVR.  Echo in 9/19 with patient in NSR showed EF up to 50%, mild diffuse hypokinesis.  Back in atrial fibrillation in 2/20, TEE showed EF 35-40%.   Suspect he has atrial fibrillation/tachycardia-mediated cardiomyopathy given fall in EF with recurrent atrial fibrillation.  Echo in 7/20 with EF 20-25%, moderately decreased RV systolic function.  He is now s/p Medtronic CRT-P placement with AV nodal ablation. Echo in 2/21 showed EF 35-40% (some improvement), echo 5/22 showed EF 35-40% with mild RV dysfunction.  NYHA class IIIb-IV symptoms on admit.  - Admitted 6/17 with massive volume overload and R>L HF - Off lasix. RHC today to assess hemodynamics. Volume status ok.  - 6/17 ordered but never given due to hypotension. Later discontinued. He has not received any carvedilol during this hospitalization.  - Torsemide, Veltassa, and spiro stopped on admit. - Off Entresto. - Off Jardiance (did not tolerate). 2. Atrial fibrillation: Persistent.  Difficult to control rate and rhythm.  He failed Tikosyn.  He started on amiodarone, then had atrial fibrillation ablation in 5/19 with ERAF, requiring DCCV.  Most recently cardioverted in 2/20 but back in atrial fibrillation by 3/20 and has stayed in atrial fibrillation since.  He saw [name] and they decided against redo ablation. He is now s/p AV nodal ablation.   Rate controlled.   - On amio drip due to NSVT/VT. Marland Kitchen   - Xarelto on hold for cath. No bleeding issues. 3. CAD: Extensive coronary calcification on last CT chest.  No chest pain. I suspect that his cardiomyopathy is tachycardia-mediated/atrial fibrillation-related given rise in EF in NSR and then fall when back in atrial fibrillation rather than ischemic cardiomyopathy. - No chest pain.  - No ASA with Xarelto use. - Unable to tolerate statins, Repatha, Zetia or Nexlizet.  He is now waiting to get inclisiran approval.    4. AKI on CKD Stage 3b: Off torsemide and spiro as above. Off ARNI and SGLT2i. - baseline creatinine ~1.8 - Creatinine 2.96 on admit down to 2.0 today.  5. Aortic valve disease: TEE in 2/20 showed that aortic valve was not  bicuspid (though TTE reports have read bicuspid) but did have mild stenosis and moderate AI.  Moderate AI on 7/20 echo.  Echo in 2/21 showed mild-moderate AI, moderate AS.  Echo in 5/22 with moderate AS and moderate AI. 6. Tricuspid regurgitation: Severe on echo 5/22.  May improve with diuresis as markedly volume overloaded.   7. LBBB: Chronic, now s/p CRT-P.  8. Hyperlipidemia: Unable to tolerate statins, Zetia, Nexlizet, or Repatha.   - Awaiting inclisiran. 9. Carotid stenosis: Repeat carotid dopplers in 6/22. 10. Subclavian stenosis: Suspect left subclavian stenosis.  Not noted on carotid dopplers, but large pulse differential between the arm and difficult to palpate left radial pulse.  Seems  asymptomatic. - Medical management, control lipids. - Take BP in right arm.  11. Anemia, iron deficient: Hgb 12.8 (11/21) 7.9 today. Denies overt bleeding.  -  received feraheme 6/18 - transfuse hgb < 7.5  12. Hypokalemia  K 4.2  13. NSVT 6/19 had NSVT- VT. Started on amio drip.    [place] later today to further assess.   Length of Stay: 3   Amy Clegg NP-C  11/12/2020, 10:16 AM  Advanced Heart Failure Team Pager [phone] (M-F; [place])  Please contact [facility] for night-coverage after hours (4p -7a ) and weekends on [email or web address]   Patient seen and examined with the above-signed Advanced Practice Provider and/or Housestaff. I personally reviewed laboratory data, imaging studies and relevant notes. I independently examined the patient and formulated the important aspects of the plan. I have edited the note to reflect any of my changes or salient points. I have personally discussed the plan with the patient and/or family.  Developed sustained VT last night in setting of attempts at aggressive diuresis. However electrolytes ok.   VT quiescent now on IV amio. He has CRT-P in place so no ICD.  Weight stable. Creatinine improved.   General:  Well appearing. No resp difficulty HEENT:  normal Neck: supple. JVP elevated with prominent v waves . Carotids 2+ bilat; no bruits. No lymphadenopathy or thryomegaly appreciated. Cor: PMI nondisplaced. Irregular rate & rhythm. 2/6 TR Lungs: clear Abdomen: soft, nontender, nondistended. No hepatosplenomegaly. No bruits or masses. Good bowel sounds. Extremities: no cyanosis, clubbing, rash, edema Neuro: alert & orientedx3, cranial nerves grossly intact. moves all 4 extremities w/o difficulty. Affect pleasant  VT now quiescent on IV amio. Will review with EP.   Volume status looks better but neck veins still markedly elevated. Will plan RHC today. SCr improving. Hold lasix for now. Xarelto on hold for cath.   Glori Bickers, MD  1:11 PM

## 2020-11-12 NOTE — Progress Notes (Signed)
No recurrent VT since stopping lasix gtt and starting amio load. Interrogated device which showed short bursts of NSVT 06/19 AM followed by longer episodes at 1400 (16 seconds), followed by sustained VT at 20:00 (whole episode ~45 seconds, intermittently paced in between several stretches so first part was 27 seconds, briefly broke out to VP then back into it and eventually broke again on his own). All of his lab work returned nl except for mildly elevated lactate (2.7, post VT). Electrolytes WNLs (K 4.5, Mg 2.2). Wt (156.6 lb most recent, most recent wt at home were 162-164 lb). UOP not as robust over past 24h as prior, I 1100/O1200, close to net even. Prior to that he was net negative 1 L.  He is planned for RHC today which I think will help [place] better differentiate what his volume status is.  For now he is not having any recurrent episodes of NSVT which is somewhat reassuring while he is off of Lasix and on amiodarone.  If he were to have recurrent episodes while not aggressively diuresing then we may need to consider device upgrade in the future.  For now I think we can attribute this to aggressive treatment of his heart failure and fluid shifts although his electrolytes were all within normal limits.  He is overall feeling well now with no significant shortness of breath.

## 2020-11-12 NOTE — Plan of Care (Signed)
  Problem: Cardiac: Goal: Ability to achieve and maintain adequate cardiopulmonary perfusion will improve Outcome: Progressing   Problem: Activity: Goal: Capacity to carry out activities will improve Outcome: Progressing   Problem: Cardiovascular: Goal: Ability to achieve and maintain adequate cardiovascular perfusion will improve Outcome: Progressing Goal: Vascular access site(s) Level 0-1 will be maintained Outcome: Progressing

## 2020-11-12 NOTE — Progress Notes (Signed)
ANTICOAGULATION CONSULT NOTE - Initial Consult  Pharmacy Consult for heparin Indication: atrial fibrillation  Allergies  Allergen Reactions   Atorvastatin Other (See Comments)    Severe fatigue on 10mg  and 40mg  dosing   Nexlizet [Bempedoic Acid-Ezetimibe]     myalgias   Repatha [Evolocumab] Other (See Comments)    Leg weakness   Rosuvastatin Other (See Comments)    myalgias on 5mg  daily    Patient Measurements: Height: 5\' 8"  (172.7 cm) Weight: 71.2 kg (156 lb 15.5 oz) IBW/kg (Calculated) : 68.4 Heparin Dosing Weight: 71kg  Vital Signs: Temp: 97.5 F (36.4 C) (06/20 1100) Temp Source: Oral (06/20 1100) BP: 100/65 (06/20 1400) Pulse Rate: 59 (06/20 1400)  Labs: Recent Labs    11/10/20 0152 11/11/20 0341 11/11/20 2117 11/12/20 0044  HGB 7.7*  --  7.9*  --   HCT 23.4*  --  24.3*  --   PLT 196  --  189  --   CREATININE 2.47* 2.15* 2.02* 1.95*    Estimated Creatinine Clearance: 29.2 mL/min (A) (by C-G formula based on SCr of 1.95 mg/dL (H)).   Medical History: Past Medical History:  Diagnosis Date   Arthritis    Biatrial enlargement    severe   Bicuspid aortic valve    CHF (congestive heart failure) ([place])    JULY 2014   Chronic renal insufficiency    not aware   GERD (gastroesophageal reflux disease)    History of cardioversion 12/16/2012   History of stomach ulcers    Hypertension    Left bundle branch block    Nonischemic cardiomyopathy ([place])        Persistent atrial fibrillation ([place])    multiple prior cardioversion   Sinus bradycardia    Status post clamping of cerebral aneurysm 80    Assessment: 80 yo M with persistent Afib. On rivaroxaban 15mg  daily prior to admission. Pharmacy consulted for heparin.    S/p cath. ClCr ~30 ml/min and improving. H/H low stable, plt stable.    Goal of Therapy:  Heparin level 0.3-0.7 units/ml Monitor platelets by anticoagulation protocol: Yes   Plan:  Start heparin 1000 units/hr at midnight Monitor daily HL,  CBC/plt Monitor for signs/symptoms of bleeding    Benetta Spar, PharmD, BCPS, BCCP Clinical Pharmacist  Please check AMION for all [place] phone numbers After 10:00 PM, call [place]

## 2020-11-12 NOTE — Interval H&P Note (Signed)
History and Physical Interval Note:  11/12/2020 3:43 PM  Travis [name]  has presented today for surgery, with the diagnosis of HF.  The various methods of treatment have been discussed with the patient and family. After consideration of risks, benefits and other options for treatment, the patient has consented to  Procedure(s): RIGHT HEART CATH (N/A) [place] as a surgical intervention.  The patient's history has been reviewed, patient examined, no change in status, stable for surgery.  I have reviewed the patient's chart and labs.  Questions were answered to the patient's satisfaction.     Travis [name]

## 2020-11-13 ENCOUNTER — Encounter (HOSPITAL_COMMUNITY): Payer: Self-pay | Admitting: Internal Medicine

## 2020-11-13 DIAGNOSIS — I5023 Acute on chronic systolic (congestive) heart failure: Secondary | ICD-10-CM

## 2020-11-13 DIAGNOSIS — Z7189 Other specified counseling: Secondary | ICD-10-CM | POA: Diagnosis not present

## 2020-11-13 DIAGNOSIS — Z515 Encounter for palliative care: Secondary | ICD-10-CM

## 2020-11-13 LAB — CBC
HCT: 24.2 % — ABNORMAL LOW (ref 39.0–52.0)
Hemoglobin: 7.8 g/dL — ABNORMAL LOW (ref 13.0–17.0)
MCH: 26.8 pg (ref 26.0–34.0)
MCHC: 32.2 g/dL (ref 30.0–36.0)
MCV: 83.2 fL (ref 80.0–100.0)
Platelets: 210 10*3/uL (ref 150–400)
RBC: 2.91 MIL/uL — ABNORMAL LOW (ref 4.22–5.81)
RDW: 18.9 % — ABNORMAL HIGH (ref 11.5–15.5)
WBC: 9.3 10*3/uL (ref 4.0–10.5)
nRBC: 2.6 % — ABNORMAL HIGH (ref 0.0–0.2)

## 2020-11-13 LAB — COOXEMETRY PANEL
Carboxyhemoglobin: 1.8 % — ABNORMAL HIGH (ref 0.5–1.5)
Methemoglobin: 1.6 % — ABNORMAL HIGH (ref 0.0–1.5)
O2 Saturation: 54.6 %
Total hemoglobin: 7.6 g/dL — ABNORMAL LOW (ref 12.0–16.0)

## 2020-11-13 LAB — APTT: aPTT: 75 seconds — ABNORMAL HIGH (ref 24–36)

## 2020-11-13 LAB — BASIC METABOLIC PANEL
Anion gap: 13 (ref 5–15)
BUN: 58 mg/dL — ABNORMAL HIGH (ref 8–23)
CO2: 24 mmol/L (ref 22–32)
Calcium: 9 mg/dL (ref 8.9–10.3)
Chloride: 95 mmol/L — ABNORMAL LOW (ref 98–111)
Creatinine, Ser: 2.31 mg/dL — ABNORMAL HIGH (ref 0.61–1.24)
GFR, Estimated: 28 mL/min — ABNORMAL LOW (ref >60)
Glucose, Bld: 135 mg/dL — ABNORMAL HIGH (ref 70–99)
Potassium: 3.2 mmol/L — ABNORMAL LOW (ref 3.5–5.1)
Sodium: 132 mmol/L — ABNORMAL LOW (ref 135–145)

## 2020-11-13 LAB — HEPARIN LEVEL (UNFRACTIONATED): Heparin Unfractionated: 0.85 IU/mL — ABNORMAL HIGH (ref 0.30–0.70)

## 2020-11-13 MED ORDER — LORAZEPAM 1 MG PO TABS
2.0000 mg | ORAL_TABLET | ORAL | Status: DC | PRN
  Administered 2020-11-13: 2 mg via ORAL
  Filled 2020-11-13: qty 2

## 2020-11-13 MED ORDER — POTASSIUM CHLORIDE 20 MEQ PO PACK
40.0000 meq | PACK | Freq: Four times a day (QID) | ORAL | Status: DC
  Administered 2020-11-13: 40 meq via ORAL
  Filled 2020-11-13: qty 2

## 2020-11-13 MED ORDER — OXYCODONE HCL 5 MG/5ML PO SOLN: 5.0000 mg | ORAL | Status: DC | PRN

## 2020-11-13 MED ORDER — RIVAROXABAN 15 MG PO TABS
15.0000 mg | ORAL_TABLET | Freq: Every day | ORAL | Status: DC
  Administered 2020-11-13 – 2020-11-14 (×2): 15 mg via ORAL
  Filled 2020-11-13 (×2): qty 1

## 2020-11-13 MED ORDER — HALOPERIDOL LACTATE 5 MG/ML IJ SOLN
INTRAMUSCULAR | Status: AC
  Filled 2020-11-13: qty 1

## 2020-11-13 MED ORDER — POTASSIUM CHLORIDE CRYS ER 20 MEQ PO TBCR
40.0000 meq | EXTENDED_RELEASE_TABLET | Freq: Four times a day (QID) | ORAL | Status: DC
  Filled 2020-11-13: qty 2

## 2020-11-13 NOTE — Consult Note (Signed)
Consultation Note Date: 11/13/2020   Patient Name: Travis [name]  DOB: 1940-09-25  MRN: [id number]  Age / Sex: 80 y.o., male  PCP: [name], MD Referring Physician: Jolaine Artist, MD  Reason for Consultation: Establishing goals of care  HPI/Patient Profile: 80 y.o. male  with past medical history of CAD, persistent atrial fibrillation s/p, chronic CHF admitted on 11/09/2020 with marked fluid overload, acute on chronic kidney injury, and cardiogenic shock. He is on milrinone, amiodorone and lasix infusions. He expressed to heart failure team his desire not to proceed with further interventions and his desire to just go home. Palliative medicine consulted for assistance with end of life care.    Clinical Assessment and Goals of Care: Met with patient and his friend Nanine Means.  Patient very clearly discussed that his goals are to go home asap, not continue further life prolonging interventions and to die peacefully at home.  Hospice services and philosophy of care discussed and patient agrees these are in line with his goals of care.  Symptom management also reviewed.  Code status reviewed- DNR is appropriate based on his desired goal to be at home and die comfortably. His only worry is that his transition will take too long.   Primary Decision Maker PATIENT    SUMMARY OF RECOMMENDATIONS  -DNR -TOC order for Hospice -Continue milrionone, lasix and amiodorone until discharge -Comfort meds as ordered   Code Status/Advance Care Planning: DNR   Prognosis:   < 4 weeks  Discharge Planning: Home with Hospice  Primary Diagnoses: Present on Admission: **None**   I have reviewed the medical record, interviewed the patient and family, and examined the patient. The following aspects are pertinent.  Past Medical History:  Diagnosis Date   Arthritis    Biatrial enlargement    severe    Bicuspid aortic valve    CHF (congestive heart failure) ([facility])    JULY 2014   Chronic renal insufficiency    not aware   GERD (gastroesophageal reflux disease)    History of cardioversion 12/16/2012   History of stomach ulcers    Hypertension    Left bundle branch block    Nonischemic cardiomyopathy (HCC)        Persistent atrial fibrillation (HCC)    multiple prior cardioversion   Sinus bradycardia    Status post clamping of cerebral aneurysm 79   Social History   Socioeconomic History   Marital status: Single    Spouse name: Not on file   Number of children: 0   Years of education: Not on file   Highest education level: Not on file  Occupational History   Occupation: Retired  Tobacco Use   Smoking status: Former    Packs/day: 1.50    Years: 25.00    Pack years: 37.50    Types: Cigarettes    Quit date: 05/26/1986    Years since quitting: 34.4   Smokeless tobacco: Never  Vaping Use   Vaping Use: Never used  Substance and Sexual Activity  Alcohol use: Not Currently    Alcohol/week: 1.0 standard drink    Types: 1 Standard drinks or equivalent per week    Comment: stopped drinking    Drug use: No   Sexual activity: Not Currently  Other Topics Concern   Not on file  Social History Narrative   Homosexual. Lives alone. Not sexually active.       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, general practice)      Hobbies: bridge, formerly tennis hoping to get back, walking   Social Determinants of Radio broadcast assistant Strain: Low Risk    Difficulty of Paying Living Expenses: Not hard at [employer] Insecurity: No Food Insecurity   Worried About Charity fundraiser in the Last Year: Never true   Arboriculturist in the Last Year: Never true  Transportation Needs: No Transportation Needs   Lack of Transportation (Medical): No   Lack of Transportation (Non-Medical): No  Physical Activity: Insufficiently Active   Days of Exercise per Week: 2 days   Minutes  of Exercise per Session: 50 min  Stress: No Stress Concern Present   Feeling of Stress : Not at all  Social Connections: Socially Isolated   Frequency of Communication with Friends and Family: More than three times a week   Frequency of Social Gatherings with Friends and Family: More than three times a week   Attends Religious Services: Never   Marine scientist or Organizations: No   Attends Music therapist: Never   Marital Status: Never married   Scheduled Meds:  Chlorhexidine Gluconate Cloth  6 each Topical Daily   pantoprazole  40 mg Oral BID   rivaroxaban  15 mg Oral Daily   sodium chloride flush  3 mL Intravenous Q12H   sodium chloride flush  3 mL Intravenous Q12H   Continuous Infusions:  sodium chloride     sodium chloride     amiodarone 30 mg/hr (11/13/20 1400)   furosemide (LASIX) 200 mg in dextrose 5% 100 mL (57m/mL) infusion 15 mg/hr (11/13/20 1400)   milrinone 0.125 mcg/kg/min (11/13/20 1400)   PRN Meds:.sodium chloride, sodium chloride, acetaminophen, LORazepam, ondansetron (ZOFRAN) IV, oxyCODONE, sodium chloride flush, sodium chloride flush Medications Prior to Admission:  Prior to Admission medications   Medication Sig Start Date End Date Taking? Authorizing Provider  acetaminophen (TYLENOL) 500 MG tablet Take 1,000 mg by mouth every 8 (eight) hours as needed for moderate pain or headache.   Yes [provider]  carvedilol (COREG) 12.5 MG tablet Take 12.5 mg by mouth 2 (two) times daily with a meal.   Yes [provider]  Coenzyme Q10 200 MG capsule Take 1 capsule (200 mg total) by mouth daily. 10/13/19  Yes MLarey Dresser MD  finasteride (PROPECIA) 1 MG tablet TAKE 1 TABLET BY MOUTH EVERY DAY Patient taking differently: Take 1 mg by mouth daily. 03/18/20  Yes HMarin Olp MD  Multiple Vitamins-Minerals (PRESERVISION AREDS 2+MULTI VIT PO) Take 1 tablet by mouth in the morning and at bedtime.   Yes [provider]   mupirocin ointment (BACTROBAN) 2 % Apply 1 application topically daily. 05/23/19  Yes [provider]  pantoprazole (PROTONIX) 40 MG tablet Take 40 mg by mouth 2 (two) times daily.   Yes [provider]  patiromer (VELTASSA) 8.4 g packet Take 1 packet (8.4 g total) by mouth daily. 06/13/20  Yes MLarey Dresser MD  Rivaroxaban (XARELTO) 15 MG  TABS tablet TAKE 1 TABLET BY MOUTH DAILY WITH SUPPER Patient taking differently: Take 15 mg by mouth daily with supper. 12/07/19  Yes Larey Dresser, MD  temazepam (RESTORIL) 15 MG capsule TAKE 1 CAPSULE(15 MG) BY MOUTH AT BEDTIME AS NEEDED FOR SLEEP Patient taking differently: Take 15 mg by mouth at bedtime. 05/31/20  Yes [name], MD  torsemide (DEMADEX) 20 MG tablet Take 3 tablets (60 mg total) by mouth daily. 11/01/20  Yes Larey Dresser, MD  traMADol (ULTRAM) 50 MG tablet TAKE 1 TABLET BY MOUTH THREE TIMES DAILY AS NEEDED FOR MODERATE OR SEVERE ARTHRITIS PAIN IN HANDS AND NECK Patient taking differently: Take 50 mg by mouth 3 (three) times daily as needed for moderate pain. 10/23/20  Yes [name], MD  spironolactone (ALDACTONE) 25 MG tablet TAKE 1 TABLET BY MOUTH EVERY DAY 11/12/20   Larey Dresser, MD   Allergies  Allergen Reactions   Atorvastatin Other (See Comments)    Severe fatigue on 23m and 459mdosing   Nexlizet [Bempedoic Acid-Ezetimibe]     myalgias   Repatha [Evolocumab] Other (See Comments)    Leg weakness   Rosuvastatin Other (See Comments)    myalgias on 49m59maily   Review of Systems  Physical Exam  Vital Signs: BP 108/67   Pulse 60   Temp 98.1 F (36.7 C) (Oral)   Resp 15   Ht 5' 8" (1.727 m)   Wt 70.8 kg   SpO2 97%   BMI 23.73 kg/m  Pain Scale: 0-10   Pain Score: 0-No pain   SpO2: SpO2: 97 % O2 Device:SpO2: 97 % O2 Flow Rate: .O2 Flow Rate (L/min): 2 L/min  IO: Intake/output summary:  Intake/Output Summary (Last 24 hours) at 11/13/2020 1452 Last data filed at 11/13/2020  1400 Gross per 24 hour  Intake 1499.7 ml  Output 2450 ml  Net -950.3 ml    LBM: Last BM Date: 11/12/20 Baseline Weight: Weight: 73.3 kg Most recent weight: Weight: 70.8 kg     Palliative Assessment/Data: PPS: 20%     Thank you for this consult. Palliative medicine will continue to follow and assist as needed.   Time In: 1352 Time Out: 1503 Time Total: 71 mins Greater than 50%  of this time was spent counseling and coordinating care related to the above assessment and plan.  Signed by: KasMariana KaufmanGNP-C Palliative Medicine    Please contact Palliative Medicine Team phone at [phone] questions and concerns.  For individual provider: See AmiShea Evans

## 2020-11-13 NOTE — Progress Notes (Signed)
[facility])  Referral received through for hospice services at home.  Contacted NOK and friend Manuela Schwartz, left a message.  [facility] will continue to follow and assist with discharge plans.  Venia Carbon RN, BSN, [facility] Liaison

## 2020-11-13 NOTE — Discharge Summary (Addendum)
Advanced Heart Failure Team  Discharge Summary   Patient ID: Travis [name] MRN: [id number], DOB/AGE: [birth year] 81 y.o. Admit date: 11/09/2020 D/C date:     11/14/2020   Primary Discharge Diagnoses:  1. Acute on chronic systolic CHF-->cardiogenic shock 2. Atrial fibrillation: Persistent.   3. CAD: Extensive coronary calcification on last CT chest. .    4. AKI on CKD Stage 3b 5. Aortic valve disease 6. Tricuspid regurgitation 7.  Hyperlipidemia 8. Carotid stenosis 9. Subclavian stenosis 10. Anemia, iron deficient 11. Hypokalemia 12. NSVT   Hospital Course: Travis [name] is a 80 y.o. male with h/o persistent atrial fibrillation s/p multiple failed DCCV, failed Tikosyn, ERAF after atrial fibrillation ablation, bicuspid AV with moderate AS and Mild AI, HTN, HLD, and chronic systolic CHF (EF 64-33%) with presumed tachy-mediated CMP.  Admitted from HF clinic with marked volume overload, hypotension, and A/C Systolic Heart Failure. HF meds held on admit. Diuresed with lasix. Hospital course complicated by VT and low output heart failure. Started on amiodarone drip. Had [place] with elevated filling pressure and low output physiology. Placed on milrinone and continued to diurese with IV lasix.  He has known severe RV failure with valvular disease. TEE was discussed to further evaluate but he is not a surgical candidate and  he was clear that he did not want any more testing. Palliative Care consulted and he wished to pursue Hospice. See below for detailed problem list.   Discharging home with [facility].   1. Acute on chronic systolic CHF-->Cardiogenic Shock: Echo 3/19 with EF 30-35%, mild to moderately decreased RV systolic function in setting of atrial fibrillation with RVR.  Echo in 9/19 with patient in NSR showed EF up to 50%, mild diffuse hypokinesis.  Back in atrial fibrillation in 2/20, TEE showed EF 35-40%.  Suspect he has atrial fibrillation/tachycardia-mediated cardiomyopathy given  fall in EF with recurrent atrial fibrillation.  Echo in 7/20 with EF 20-25%, moderately decreased RV systolic function.  He is now s/p Medtronic CRT-P placement with AV nodal ablation. Echo in 2/21 showed EF 35-40% (some improvement), echo 5/22 showed EF 35-40% with mild RV dysfunction.  NYHA class IIIb-IV symptoms on admit. - Admitted 6/17 with massive volume overload and R>L HF. Off HF meds on admit. Did not tolerate jardiance. - RHC 6/20 with elevated filling pressures and low output heart failure. CO-OX 36% and started on milrinone and lasix drip.   - Milrinone stopped on the day of discharge.  - Lasix drip stopped and started on torsemide 80 mg daily at discharge.  2. Atrial fibrillation: Persistent.  Difficult to control rate and rhythm.  He failed Tikosyn.  He started on amiodarone, then had atrial fibrillation ablation in 5/19 with ERAF, requiring DCCV.  Most recently cardioverted in 2/20 but back in atrial fibrillation by 3/20 and has stayed in atrial fibrillation since.  He saw [name] and they decided against redo ablation. He is now s/p AV nodal ablation.   - On amio drip due to NSVT/VT. Amio drip stopped and placed on amio 200 mg po daily.  - Continue xarelto.  3. CAD: Extensive coronary calcification on last CT chest.  No chest pain. I suspect that his cardiomyopathy is tachycardia-mediated/atrial fibrillation-related given rise in EF in NSR and then fall when back in atrial fibrillation rather than ischemic cardiomyopathy. - No chest pain.  - No ASA with Xarelto use. - Unable to tolerate statins, Repatha, Zetia or Nexlizet.  He is now waiting to get  inclisiran approval.    4. AKI on CKD Stage 3b: Off torsemide and spiro as above. Off ARNI and SGLT2i. - baseline creatinine ~1.8 - Creatinine 2.96 on admit, 1.95  5. Aortic valve disease: TEE in 2/20 showed that aortic valve was not bicuspid (though TTE reports have read bicuspid) but did have mild stenosis and moderate AI.  Moderate AI  on 7/20 echo.  Echo in 2/21 showed mild-moderate AI, moderate AS.  Echo in 5/22 with moderate AS and moderate AI. 6. Tricuspid regurgitation: Severe on echo 5/22.  May improve with diuresis as markedly volume overloaded.   7. LBBB: Chronic, now s/p CRT-P.  8. Hyperlipidemia: Unable to tolerate statins, Zetia, Nexlizet, or Repatha.   - Awaiting inclisiran. 9. Carotid stenosis: Repeat carotid dopplers in 6/22. 10. Subclavian stenosis: Suspect left subclavian stenosis.  Not noted on carotid dopplers, but large pulse differential between the arm and difficult to palpate left radial pulse.  Seems asymptomatic. - Medical management, control lipids. - Take BP in right arm.  11. Anemia, iron deficient: Hgb 12.8 (11/21) 8.4 today. Denies overt bleeding.  -  received feraheme 6/18 - transfuse hgb < 7.5 12. Hypokalemia At home he runs high. Will supp K prior to d/c.  13. NSVT 6/19 had NSVT- VT. Started on amio drip and transition to 200 mg daily 14. [place] with [place].      Discharge Vitals: Blood pressure 95/61, pulse 94, temperature 97.6 F (36.4 C), resp. rate 16, height 5\' 8"  (1.727 m), weight 70.1 kg, SpO2 (!) 87 %.  Labs: Lab Results  Component Value Date   WBC 10.1 11/14/2020   HGB 8.4 (L) 11/14/2020   HCT 26.2 (L) 11/14/2020   MCV 83.7 11/14/2020   PLT 210 11/14/2020    Recent Labs  Lab 11/11/20 2117 11/12/20 0044 11/14/20 0230  NA 129*   < > 130*  K 4.5   < > 2.9*  CL 94*   < > 92*  CO2 22   < > 24  BUN 59*   < > 50*  CREATININE 2.02*   < > 1.95*  CALCIUM 8.8*   < > 9.0  PROT 6.5  --   --   BILITOT 1.3*  --   --   ALKPHOS 136*  --   --   ALT 54*  --   --   AST 43*  --   --   GLUCOSE 149*   < > 117*   < > = values in this interval not displayed.   Lab Results  Component Value Date   CHOL 190 02/02/2020   HDL 55 02/02/2020   LDLCALC 118 (H) 02/02/2020   TRIG 94 02/02/2020   BNP (last 3 results) Recent Labs    10/23/20 1114 11/09/20 1005  BNP  2,390.9* 2,058.3*    ProBNP (last 3 results) No results for input(s): PROBNP in the last 8760 hours.   Diagnostic Studies/Procedures   CARDIAC CATHETERIZATION  Result Date: 11/12/2020 Findings: RA = 11 (v = 19) RV = 42/7 PA = 43/19 (29) PCW = 26 (v = 37) Fick cardiac output/index = 3.6/1.9 PVR = 0.8 WU Ao sat = 99% PA sat = 36%, 36% PAPi = 2.2 Assessment: 1. Significantly elevated biventricular pressures with prominent v-waves in both RA and PCWP tracings suggestive of significant TR/MR 2. Low output with mixed venous sat 36% Plan/Discussion: Start low-dose milrinone. (Carefully with recent VT). Resume IV lasix. Will likely need TEE to further evaluate  MV/TV. Glori Bickers, MD 3:56 PM    Discharge Medications   Allergies as of 11/14/2020       Reactions   Atorvastatin Other (See Comments)   Severe fatigue on 10mg  and 40mg  dosing   Nexlizet [bempedoic Acid-ezetimibe]    myalgias   Repatha [evolocumab] Other (See Comments)   Leg weakness   Rosuvastatin Other (See Comments)   myalgias on 5mg  daily        Medication List     STOP taking these medications    carvedilol 12.5 MG tablet Commonly known as: COREG   Coenzyme Q10 200 MG capsule   spironolactone 25 MG tablet Commonly known as: ALDACTONE   Veltassa 8.4 g packet Generic drug: patiromer       TAKE these medications    acetaminophen 500 MG tablet Commonly known as: TYLENOL Take 1,000 mg by mouth every 8 (eight) hours as needed for moderate pain or headache.   amiodarone 200 MG tablet Commonly known as: PACERONE Take 1 tablet (200 mg total) by mouth daily.   finasteride 1 MG tablet Commonly known as: PROPECIA TAKE 1 TABLET BY MOUTH EVERY DAY   LORazepam 2 MG tablet Commonly known as: ATIVAN Take 1 tablet (2 mg total) by mouth every 4 (four) hours as needed for anxiety or sleep.   mupirocin ointment 2 % Commonly known as: BACTROBAN Apply 1 application topically daily.   pantoprazole 40 MG  tablet Commonly known as: PROTONIX Take 40 mg by mouth 2 (two) times daily.   PRESERVISION AREDS 2+MULTI VIT PO Take 1 tablet by mouth in the morning and at bedtime.   Rivaroxaban 15 MG Tabs tablet Commonly known as: Xarelto TAKE 1 TABLET BY MOUTH DAILY WITH SUPPER What changed:  how much to take how to take this when to take this additional instructions   temazepam 15 MG capsule Commonly known as: RESTORIL TAKE 1 CAPSULE(15 MG) BY MOUTH AT BEDTIME AS NEEDED FOR SLEEP What changed: See the new instructions.   torsemide 20 MG tablet Commonly known as: DEMADEX Take 4 tablets (80 mg total) by mouth daily. What changed: how much to take   traMADol 50 MG tablet Commonly known as: ULTRAM TAKE 1 TABLET BY MOUTH THREE TIMES DAILY AS NEEDED FOR MODERATE OR SEVERE ARTHRITIS PAIN IN HANDS AND NECK What changed: See the new instructions.        Disposition   The patient will be discharged in stable condition to home. Discharge Instructions     (HEART FAILURE PATIENTS) Call MD:  Anytime you have any of the following symptoms: 1) 3 pound weight gain in 24 hours or 5 pounds in 1 week 2) shortness of breath, with or without a dry hacking cough 3) swelling in the hands, feet or stomach 4) if you have to sleep on extra pillows at night in order to breathe.   Complete by: As directed    Diet - low sodium heart healthy   Complete by: As directed    Increase activity slowly   Complete by: As directed        Follow-up Information     [facility] up.   Specialty: Cardiology Contact information: [address] [id number] [place] [phone]                  Duration of Discharge Encounter: Greater than 35 minutes   Signed, Darrick Grinder NP-C  11/14/2020, 10:47 AM  Patient  seen and examined with the above-signed Advanced Practice Provider and/or Housestaff. I personally reviewed  laboratory data, imaging studies and relevant notes. I independently examined the patient and formulated the important aspects of the plan. I have edited the note to reflect any of my changes or salient points. I have personally discussed the plan with the patient and/or family.   Discharging home today with hospice. See rounding notes for full details.   Glori Bickers, MD  3:07 PM

## 2020-11-13 NOTE — Progress Notes (Signed)
Advanced Heart Failure Rounding Note   Subjective:    Admitted for marked volume overload and AKI with R>L heart failure. 6/17.  Carvedilol ordered but never given due to hypotension. He has not received any carvedilol.   6/18 given iron feraheme.  6/19 Dizzy then had NSVT/VT started on amio drip and lasix drip was stopped. Moved to ICU.  Lasix drip stopped.  6/20 RHC elevated biventricular pressures and low output HF with CO-OX 36%. Milrinone 0.125 mcg + lasix drip started.    Remains on amio drip + milrinone 0.125.  Creatinine improving 3.0 -> 2.5 -> 2.2 ->2->2.3     Objective:   Weight Range:  Vital Signs:   Temp:  [97.3 F (36.3 C)-97.9 F (36.6 C)] 97.3 F (36.3 C) (06/21 0806) Pulse Rate:  [36-98] 61 (06/21 0700) Resp:  [9-30] 15 (06/21 0400) BP: (96-123)/(49-87) 123/87 (06/21 0700) SpO2:  [91 %-100 %] 94 % (06/21 0700) Weight:  [70.8 kg] 70.8 kg (06/21 0557) Last BM Date: 11/12/20  Weight change: Filed Weights   11/11/20 0418 11/11/20 1944 11/13/20 0557  Weight: 71.2 kg 71.2 kg 70.8 kg    Intake/Output:   Intake/Output Summary (Last 24 hours) at 11/13/2020 1014 Last data filed at 11/13/2020 0806 Gross per 24 hour  Intake 1224.5 ml  Output 1700 ml  Net -475.5 ml     Physical Exam: General: No resp difficulty HEENT: normal Neck: supple. no JVD. Carotids 2+ bilat; no bruits. No lymphadenopathy or thryomegaly appreciated. Cor: PMI nondisplaced. Regular rate & rhythm. No rubs, gallops or murmurs. Lungs: clear Abdomen: soft, nontender, nondistended. No hepatosplenomegaly. No bruits or masses. Good bowel sounds. Extremities: no cyanosis, clubbing, rash, edema Neuro: alert & orientedx3, cranial nerves grossly intact. moves all 4 extremities w/o difficulty. Affect pleasant  Telemetry: A fib  Labs: Basic Metabolic Panel: Recent Labs  Lab 11/09/20 1942 11/10/20 0152 11/11/20 0341 11/11/20 2117 11/12/20 0044 11/12/20 1505 11/12/20 1509 11/13/20 0443   NA  --  129* 132* 129* 132* 134* 135  136 132*  K  --  3.1* 3.7 4.5 4.2 3.8 3.8  3.8 3.2*  CL  --  95* 96* 94* 97*  --   --  95*  CO2  --  20* 22 22 22   --   --  24  GLUCOSE  --  150* 158* 149* 131*  --   --  135*  BUN  --  74* 65* 59* 61*  --   --  58*  CREATININE  --  2.47* 2.15* 2.02* 1.95*  --   --  2.31*  CALCIUM  --  8.7* 8.9 8.8* 9.0  --   --  9.0  MG 1.4* 2.7*  --  2.2  --   --   --   --     Liver Function Tests: Recent Labs  Lab 11/11/20 2117  AST 43*  ALT 54*  ALKPHOS 136*  BILITOT 1.3*  PROT 6.5  ALBUMIN 3.6   No results for input(s): LIPASE, AMYLASE in the last 168 hours. No results for input(s): AMMONIA in the last 168 hours.  CBC: Recent Labs  Lab 11/09/20 1005 11/10/20 0152 11/11/20 2117 11/12/20 1505 11/12/20 1509 11/13/20 0824  WBC 6.3 6.4 8.0  --   --  9.3  HGB 7.8* 7.7* 7.9* 8.8* 8.8*  8.8* 7.8*  HCT 24.6* 23.4* 24.3* 26.0* 26.0*  26.0* 24.2*  MCV 83.7 81.8 81.8  --   --  83.2  PLT 198 196  189  --   --  210    Cardiac Enzymes: No results for input(s): CKTOTAL, CKMB, CKMBINDEX, TROPONINI in the last 168 hours.  BNP: BNP (last 3 results) Recent Labs    10/23/20 1114 11/09/20 1005  BNP 2,390.9* 2,058.3*    ProBNP (last 3 results) No results for input(s): PROBNP in the last 8760 hours.    Other results:  Imaging: CARDIAC CATHETERIZATION  Result Date: 11/12/2020 Findings: RA = 11 (v = 19) RV = 42/7 PA = 43/19 (29) PCW = 26 (v = 37) Fick cardiac output/index = 3.6/1.9 PVR = 0.8 WU Ao sat = 99% PA sat = 36%, 36% PAPi = 2.2 Assessment: 1. Significantly elevated biventricular pressures with prominent v-waves in both RA and PCWP tracings suggestive of significant TR/MR 2. Low output with mixed venous sat 36% Plan/Discussion: Start low-dose milrinone. (Carefully with recent VT). Resume IV lasix. Will likely need TEE to further evaluate MV/TV. Glori Bickers, MD 3:56 PM     Medications:     Scheduled Medications:  Chlorhexidine  Gluconate Cloth  6 each Topical Daily   haloperidol lactate       pantoprazole  40 mg Oral BID   sodium chloride flush  3 mL Intravenous Q12H   sodium chloride flush  3 mL Intravenous Q12H    Infusions:  sodium chloride     sodium chloride     amiodarone 30 mg/hr (11/13/20 0600)   furosemide (LASIX) 200 mg in dextrose 5% 100 mL (2mg /mL) infusion 15 mg/hr (11/13/20 0600)   heparin 1,000 Units/hr (11/13/20 0600)   milrinone 0.125 mcg/kg/min (11/13/20 0600)    PRN Medications: sodium chloride, sodium chloride, acetaminophen, ondansetron (ZOFRAN) IV, sodium chloride flush, sodium chloride flush   Assessment/Plan:   1. Acute on chronic systolic CHF: Echo 2/54 with EF 30-35%, mild to moderately decreased RV systolic function in setting of atrial fibrillation with RVR.  Echo in 9/19 with patient in NSR showed EF up to 50%, mild diffuse hypokinesis.  Back in atrial fibrillation in 2/20, TEE showed EF 35-40%.  Suspect he has atrial fibrillation/tachycardia-mediated cardiomyopathy given fall in EF with recurrent atrial fibrillation.  Echo in 7/20 with EF 20-25%, moderately decreased RV systolic function.  He is now s/p Medtr[employer] CRT-P placement with AV nodal ablation. Echo in 2/21 showed EF 35-40% (some improvement), echo 5/22 showed EF 35-40% with mild RV dysfunction.  NYHA class IIIb-IV symptoms on admit.  - Admitted 6/17 with massive volume overload and R>L HF. Off HF meds on admit. Did not tolerate jardiance.  - RHC 6/20 with elevated filling pressures and low output heart failure. CO-OX 36% and started on milrinone.  2. Atrial fibrillation: Persistent.  Difficult to control rate and rhythm.  He failed Tikosyn.  He started on amiodarone, then had atrial fibrillation ablation in 5/19 with ERAF, requiring DCCV.  Most recently cardioverted in 2/20 but back in atrial fibrillation by 3/20 and has stayed in atrial fibrillation since.  He saw [name] and they decided against redo ablation. He is now  s/p AV nodal ablation.   - On amio drip due to NSVT/VT. Marland Kitchen   - Xarelto on hold for cath. No bleeding issues. 3. CAD: Extensive coronary calcification on last CT chest.  No chest pain. I suspect that his cardiomyopathy is tachycardia-mediated/atrial fibrillation-related given rise in EF in NSR and then fall when back in atrial fibrillation rather than ischemic cardiomyopathy. - No chest pain.  - No ASA with Xarelto use. - Unable to  tolerate statins, Repatha, Zetia or Nexlizet.  He is now waiting to get inclisiran approval.    4. AKI on CKD Stage 3b: Off torsemide and spiro as above. Off ARNI and SGLT2i. - baseline creatinine ~1.8 - Creatinine 2.96 on admit, 2.3 today.   5. Aortic valve disease: TEE in 2/20 showed that aortic valve was not bicuspid (though TTE reports have read bicuspid) but did have mild stenosis and moderate AI.  Moderate AI on 7/20 echo.  Echo in 2/21 showed mild-moderate AI, moderate AS.  Echo in 5/22 with moderate AS and moderate AI. 6. Tricuspid regurgitation: Severe on echo 5/22.  May improve with diuresis as markedly volume overloaded.   7. LBBB: Chronic, now s/p CRT-P.  8. Hyperlipidemia: Unable to tolerate statins, Zetia, Nexlizet, or Repatha.   - Awaiting inclisiran. 9. Carotid stenosis: Repeat carotid dopplers in 6/22. 10. Subclavian stenosis: Suspect left subclavian stenosis.  Not noted on carotid dopplers, but large pulse differential between the arm and difficult to palpate left radial pulse.  Seems asymptomatic. - Medical management, control lipids. - Take BP in right arm.  11. Anemia, iron deficient: Hgb 12.8 (11/21) 7.8 today. Denies overt bleeding.  -  received feraheme 6/18 - transfuse hgb < 7.5  12. Hypokalemia  K 3.2 . Supp K  13. NSVT 6/19 had NSVT- VT. Started on amio drip.     Length of Stay: 4   Amy Clegg NP-C  11/13/2020, 10:14 AM  Advanced Heart Failure Team Pager [phone] (M-F; 7a - 4p)  Please contact [facility] for night-coverage  after hours (4p -7a ) and weekends on [email or web address]  Agree with above.   RHC yesterday c/w with cardiogenic shock and triple valve disease. Co-ox overnight 28%. Now on milrinone co-ox 55%.   Says breathing feels ok. Says he is tired and frustrated. Asking "Are we just prolonging the inevitable?"  General:  Fatigued appearing. No resp difficulty HEENT: normal Neck: supple.RIJ TLC  JVP up  Carotids 2+ bilat; no bruits. No lymphadenopathy or thryomegaly appreciated. Cor: PMI nondisplaced. Irregular rate & rhythm. 2/6 AS 2/6 TR Lungs: clear Abdomen: soft, nontender, nondistended. No hepatosplenomegaly. No bruits or masses. Good bowel sounds. Extremities: no cyanosis, clubbing, rash, tr edema Neuro: alert & orientedx3, cranial nerves grossly intact. moves all 4 extremities w/o difficulty. Affect pleasant  Hemodynamics c/w end-stage HF. He has known severe RV failure with severe TR but also with significant MR and AI. We discussed possible TEE to evaluate his valves but he is not a surgical candidate but he was clear that he does not want any more testing.  We have consulted Palliative Care to arrange Hospice. Will continue milrinone until final plans made. Stop heparin. Switch back to Xarelto.   CRITICAL CARE Performed by: Glori Bickers  Total critical care time: 45 minutes  Critical care time was exclusive of separately billable procedures and treating other patients.  Critical care was necessary to treat or prevent imminent or life-threatening deterioration.  Critical care was time spent personally by me (independent of midlevel providers or residents) on the following activities: development of treatment plan with patient and/or surrogate as well as nursing, discussions with consultants, evaluation of patient's response to treatment, examination of patient, obtaining history from patient or surrogate, ordering and performing treatments and interventions, ordering and review of laboratory  studies, ordering and review of radiographic studies, pulse oximetry and re-evaluation of patient's condition.   Glori Bickers, MD  10:48 AM

## 2020-11-13 NOTE — Progress Notes (Signed)
Palliative- brief note- full consult to follow-   Patient would like to transition to comfort only and discharge as soon as possible home with Hospice.   Plan-  -DNR -TOC order to refer to Hospice- family requesting [facility] -Comfort meds entered -D/C IV infusions on discharge.   Mariana Kaufman, AGNP-C Palliative Medicine  No charge note

## 2020-11-13 NOTE — TOC Progression Note (Addendum)
Transition of Care (TOC) - Progression Note  Heart Failure   Patient Details  Name: Travis [name] MRN: [id number] Date of Birth: 1940/06/24  Transition of Care [facility]) CM/SW [place], [place] Phone Number: 11/13/2020, 2:18 PM  Clinical Narrative:    CSW spoke with the patient at bedside and confirmed [facility] for hospice care and patient confirmed that he has already spoken with palliative about the referral. CSW called [facility] for hospice referral per palliative consult. [facility] hospital liasion to follow up with the patient, family, and CSW. CSW notified RNCM of hospice referral. Patient reported he may need a hospital bed and other equipment and is aware that hospice will provide what he needs. Patient reported having a good support system and that his brother is on the way to visit him. Patient declined having any needs at this time. CSW will arrange transportation home tomorrow by ambulance transport via patients request.    Expected Discharge Plan: Home w Hospice Care Barriers to Discharge: Continued Medical Work up  Expected Discharge Plan and Services Expected Discharge Plan: [place] In-house Referral: Clinical Social Work Discharge Planning Services: CM Consult                                           Social Determinants of Health (SDOH) Interventions    Readmission Risk Interventions No flowsheet data found.  Tristian Sickinger, MSW, [place] Heart Failure Social Worker

## 2020-11-14 ENCOUNTER — Telehealth: Payer: Self-pay

## 2020-11-14 ENCOUNTER — Encounter (HOSPITAL_COMMUNITY): Payer: Medicare Other

## 2020-11-14 LAB — BASIC METABOLIC PANEL
Anion gap: 14 (ref 5–15)
BUN: 50 mg/dL — ABNORMAL HIGH (ref 8–23)
CO2: 24 mmol/L (ref 22–32)
Calcium: 9 mg/dL (ref 8.9–10.3)
Chloride: 92 mmol/L — ABNORMAL LOW (ref 98–111)
Creatinine, Ser: 1.95 mg/dL — ABNORMAL HIGH (ref 0.61–1.24)
GFR, Estimated: 34 mL/min — ABNORMAL LOW (ref >60)
Glucose, Bld: 117 mg/dL — ABNORMAL HIGH (ref 70–99)
Potassium: 2.9 mmol/L — ABNORMAL LOW (ref 3.5–5.1)
Sodium: 130 mmol/L — ABNORMAL LOW (ref 135–145)

## 2020-11-14 LAB — CBC
HCT: 26.2 % — ABNORMAL LOW (ref 39.0–52.0)
Hemoglobin: 8.4 g/dL — ABNORMAL LOW (ref 13.0–17.0)
MCH: 26.8 pg (ref 26.0–34.0)
MCHC: 32.1 g/dL (ref 30.0–36.0)
MCV: 83.7 fL (ref 80.0–100.0)
Platelets: 210 10*3/uL (ref 150–400)
RBC: 3.13 MIL/uL — ABNORMAL LOW (ref 4.22–5.81)
RDW: 19.7 % — ABNORMAL HIGH (ref 11.5–15.5)
WBC: 10.1 10*3/uL (ref 4.0–10.5)
nRBC: 1.2 % — ABNORMAL HIGH (ref 0.0–0.2)

## 2020-11-14 SURGERY — ECHOCARDIOGRAM, TRANSESOPHAGEAL
Anesthesia: Monitor Anesthesia Care

## 2020-11-14 MED ORDER — TORSEMIDE 20 MG PO TABS: 80.0000 mg | ORAL_TABLET | Freq: Every day | ORAL | 3 refills | Status: AC

## 2020-11-14 MED ORDER — AMIODARONE HCL 200 MG PO TABS
200.0000 mg | ORAL_TABLET | Freq: Two times a day (BID) | ORAL | Status: DC
  Administered 2020-11-14: 200 mg via ORAL
  Filled 2020-11-14: qty 1

## 2020-11-14 MED ORDER — LORAZEPAM 2 MG PO TABS: 2.0000 mg | ORAL_TABLET | ORAL | 0 refills | Status: AC | PRN

## 2020-11-14 MED ORDER — POTASSIUM CHLORIDE CRYS ER 20 MEQ PO TBCR
40.0000 meq | EXTENDED_RELEASE_TABLET | Freq: Two times a day (BID) | ORAL | Status: DC
  Administered 2020-11-14: 40 meq via ORAL
  Filled 2020-11-14: qty 2

## 2020-11-14 MED ORDER — AMIODARONE HCL 200 MG PO TABS: 200.0000 mg | ORAL_TABLET | Freq: Every day | ORAL | 6 refills | Status: AC

## 2020-11-14 NOTE — Progress Notes (Signed)
Potassium 2.9, provider paged

## 2020-11-14 NOTE — Progress Notes (Addendum)
Manufacturing engineer [facility]) Hospital Liaison Note:  Update on this Home with Hospice Referral:  Per TOC, plan is for patient to discharge home with hospice this afternoon via personal vehicle. Patient hospice eligibility confirmed from [facility] MD.    Spoke with family Konrad Dolores) and personal friend Manuela Schwartz) to support and answer questions. Both confirmed that there are no DME needs at this time - patient may want a bed in the near future which [facility] can order in the home.  Also made TOC and family aware that the AV will probably be for tomorrow.  Please send home with DNR paperwork and comfort medications.  Thank you,  Gar Ponto, RN  [facility] [phone]  UPDATE: Hospice Admission RN visit scheduled for 6:30 pm tonight per PMT request.  Please arrange discharge and transportation home before that scheduled visit - Thank you!

## 2020-11-14 NOTE — Progress Notes (Signed)
Patient pulled out IJ CVC, says he had a bad dream. Patient said he thinks it may have been the PRN medication that he received last night because he's never taken it before. RN held pressure to site and placed dressing. Will notify provider and IV team.

## 2020-11-14 NOTE — Progress Notes (Signed)
  Mobility Specialist Criteria Algorithm Info.  SATURATION QUALIFICATIONS: (This note is used to comply with regulatory documentation for home oxygen)  Patient Saturations on Room Air at Rest = 98%  Patient Saturations on Room Air while Ambulating = 88%  Patient Saturations on 0 Liters of oxygen while Ambulating = n/a%  Please briefly explain why patient needs home oxygen:  Mobility Team:  [facility] elevated:Self regulated Activity: Ambulated in hall; Dangled on edge of bed Range of motion: Active; All extremities Level of assistance: Contact guard assist, steadying assist Assistive device: None Minutes sitting in chair:  Minutes stood: 5 minutes Minutes ambulated: 5 minutes Distance ambulated (ft): 80 ft Mobility response: Tolerated fair; RN notified Bed Position: Semi-fowlers  Patient agreed to participate in mobility with anticipation to be discharged today. Per pt he lives alone and is independent with tranfers, ambulation and ADL's. He also has a cane and uses PRN. Patient ambulated in hallway 80 feet at min guard with steady gait. Required standing rest break x3. Upon returning to room patient complained of dizziness requiring seated rest. Symptoms subsided quickly with rest. Oxygen saturation was found to be 88% on room air, but quickly went up w/rest and pursed lip breathing. Tolerated ambulation fair and was left dangling EOB with NT present. RN notified.   11/14/2020 11:33 AM

## 2020-11-14 NOTE — Telephone Encounter (Signed)
I called and spoke with Alwyn Ren and gave her the below message.

## 2020-11-14 NOTE — Telephone Encounter (Signed)
Patient has discharged from hospital today to Hospice through [facility].  Is requesting for Dr. Yong Channel to be attending.  Also would like to know if Dr. Yong Channel agrees with care.

## 2020-11-14 NOTE — Telephone Encounter (Signed)
Yes I am happy to be the hospice provider-I think this is reasonable

## 2020-11-14 NOTE — Progress Notes (Signed)
Patient went into vtach for about a minute, patient stable. Provider notified.

## 2020-11-14 NOTE — Progress Notes (Signed)
Unable to get co-ox D/T loss of cvc access.

## 2020-11-14 NOTE — TOC Progression Note (Signed)
Transition of Care [facility]) - Progression Note    Patient Details  Name: Travis [name] MRN: [id number] Date of Birth: [birth year]  [facility] CM/SW Contact  Zenon Mayo, RN Phone Number: 11/14/2020, 9:00 AM  Clinical Narrative:    Patient transferred from [facility] , plan is home with Hospice with [facility] per  CSW note.  He is set up already with home hospice.   Expected Discharge Plan: Home w Hospice Care Barriers to Discharge: Continued Medical Work up  Expected Discharge Plan and Services Expected Discharge Plan: Home w Hospice Care In-house Referral: Clinical Social Work Discharge Planning Services: CM Consult                                           Social Determinants of Health (SDOH) Interventions    Readmission Risk Interventions No flowsheet data found.

## 2020-11-14 NOTE — Plan of Care (Signed)
?  Problem: Education: ?Goal: Ability to demonstrate management of disease process will improve ?Outcome: Progressing ?  ?Problem: Education: ?Goal: Ability to verbalize understanding of medication therapies will improve ?Outcome: Progressing ?  ?Problem: Cardiac: ?Goal: Ability to achieve and maintain adequate cardiopulmonary perfusion will improve ?Outcome: Progressing ?  ?

## 2020-11-14 NOTE — Progress Notes (Addendum)
Advanced Heart Failure Rounding Note   Subjective:    Admitted for marked volume overload and AKI with R>L heart failure. 6/17.  Carvedilol ordered but never given due to hypotension. He has not received any carvedilol.   6/18 given iron feraheme.  6/19 Dizzy then had NSVT/VT started on amio drip and lasix drip was stopped. Moved to ICU.  Lasix drip stopped.  6/20 RHC elevated biventricular pressures and low output HF with CO-OX 36%. Milrinone 0.125 mcg + lasix drip started. 6/21 He requested transition to comfort care. Plan for d/c with hospice.     Remains on amio drip + milrinone 0.125.  Creatinine down to 1.95   Wants to go home. Denies SOB.   Objective:   Weight Range:  Vital Signs:   Temp:  [96 F (35.6 C)-98.1 F (36.7 C)] 97.6 F (36.4 C) (06/22 0725) Pulse Rate:  [60-93] 83 (06/22 0725) Resp:  [16-19] 16 (06/22 0725) BP: (93-139)/(54-77) 98/65 (06/22 0725) SpO2:  [87 %-100 %] 87 % (06/22 0725) Weight:  [70.1 kg] 70.1 kg (06/22 0355) Last BM Date: 11/12/20  Weight change: Filed Weights   11/11/20 1944 11/13/20 0557 11/14/20 0355  Weight: 71.2 kg 70.8 kg 70.1 kg    Intake/Output:   Intake/Output Summary (Last 24 hours) at 11/14/2020 0822 Last data filed at 11/14/2020 0817 Gross per 24 hour  Intake 2071.17 ml  Output 3425 ml  Net -1353.83 ml     Physical Exam: General: No resp difficulty HEENT: normal Neck: supple. JVP 9-10. Carotids 2+ bilat; no bruits. No lymphadenopathy or thryomegaly appreciated. Cor: PMI nondisplaced. Irregular rate & rhythm. No rubs, gallops or murmurs. Lungs: clear Abdomen: soft, nontender, nondistended. No hepatosplenomegaly. No bruits or masses. Good bowel sounds. Extremities: no cyanosis, clubbing, rash, edema Neuro: alert & orientedx3, cranial nerves grossly intact. moves all 4 extremities w/o difficulty. Affect pleasant  Telemetry: A fib  Labs: Basic Metabolic Panel: Recent Labs  Lab 11/09/20 1942 11/10/20 0152  11/11/20 0341 11/11/20 2117 11/12/20 0044 11/12/20 1505 11/12/20 1509 11/13/20 0443 11/14/20 0230  NA  --  129* 132* 129* 132* 134* 135  136 132* 130*  K  --  3.1* 3.7 4.5 4.2 3.8 3.8  3.8 3.2* 2.9*  CL  --  95* 96* 94* 97*  --   --  95* 92*  CO2  --  20* 22 22 22   --   --  24 24  GLUCOSE  --  150* 158* 149* 131*  --   --  135* 117*  BUN  --  74* 65* 59* 61*  --   --  58* 50*  CREATININE  --  2.47* 2.15* 2.02* 1.95*  --   --  2.31* 1.95*  CALCIUM  --  8.7* 8.9 8.8* 9.0  --   --  9.0 9.0  MG 1.4* 2.7*  --  2.2  --   --   --   --   --     Liver Function Tests: Recent Labs  Lab 11/11/20 2117  AST 43*  ALT 54*  ALKPHOS 136*  BILITOT 1.3*  PROT 6.5  ALBUMIN 3.6   No results for input(s): LIPASE, AMYLASE in the last 168 hours. No results for input(s): AMMONIA in the last 168 hours.  CBC: Recent Labs  Lab 11/09/20 1005 11/10/20 0152 11/11/20 2117 11/12/20 1505 11/12/20 1509 11/13/20 0824 11/14/20 0230  WBC 6.3 6.4 8.0  --   --  9.3 10.1  HGB 7.8* 7.7* 7.9*  8.8* 8.8*  8.8* 7.8* 8.4*  HCT 24.6* 23.4* 24.3* 26.0* 26.0*  26.0* 24.2* 26.2*  MCV 83.7 81.8 81.8  --   --  83.2 83.7  PLT 198 196 189  --   --  210 210    Cardiac Enzymes: No results for input(s): CKTOTAL, CKMB, CKMBINDEX, TROPONINI in the last 168 hours.  BNP: BNP (last 3 results) Recent Labs    10/23/20 1114 11/09/20 1005  BNP 2,390.9* 2,058.3*    ProBNP (last 3 results) No results for input(s): PROBNP in the last 8760 hours.    Other results:  Imaging: CARDIAC CATHETERIZATION  Result Date: 11/12/2020 Findings: RA = 11 (v = 19) RV = 42/7 PA = 43/19 (29) PCW = 26 (v = 37) Fick cardiac output/index = 3.6/1.9 PVR = 0.8 WU Ao sat = 99% PA sat = 36%, 36% PAPi = 2.2 Assessment: 1. Significantly elevated biventricular pressures with prominent v-waves in both RA and PCWP tracings suggestive of significant TR/MR 2. Low output with mixed venous sat 36% Plan/Discussion: Start low-dose milrinone.  (Carefully with recent VT). Resume IV lasix. Will likely need TEE to further evaluate MV/TV. Glori Bickers, MD 3:56 PM     Medications:     Scheduled Medications:  Chlorhexidine Gluconate Cloth  6 each Topical Daily   pantoprazole  40 mg Oral BID   potassium chloride  40 mEq Oral BID   rivaroxaban  15 mg Oral Daily   sodium chloride flush  3 mL Intravenous Q12H   sodium chloride flush  3 mL Intravenous Q12H    Infusions:  sodium chloride     sodium chloride     amiodarone 30 mg/hr (11/14/20 0456)   furosemide (LASIX) 200 mg in dextrose 5% 100 mL (2mg /mL) infusion 15 mg/hr (11/14/20 0456)   milrinone 0.125 mcg/kg/min (11/14/20 0456)    PRN Medications: sodium chloride, sodium chloride, acetaminophen, LORazepam, ondansetron (ZOFRAN) IV, oxyCODONE, sodium chloride flush, sodium chloride flush   Assessment/Plan:   1. Acute on chronic systolic CHF: Echo 9/98 with EF 30-35%, mild to moderately decreased RV systolic function in setting of atrial fibrillation with RVR.  Echo in 9/19 with patient in NSR showed EF up to 50%, mild diffuse hypokinesis.  Back in atrial fibrillation in 2/20, TEE showed EF 35-40%.  Suspect he has atrial fibrillation/tachycardia-mediated cardiomyopathy given fall in EF with recurrent atrial fibrillation.  Echo in 7/20 with EF 20-25%, moderately decreased RV systolic function.  He is now s/p Medtronic CRT-P placement with AV nodal ablation. Echo in 2/21 showed EF 35-40% (some improvement), echo 5/22 showed EF 35-40% with mild RV dysfunction.  NYHA class IIIb-IV symptoms on admit.  - Admitted 6/17 with massive volume overload and R>L HF. Off HF meds on admit. Did not tolerate jardiance.  - RHC 6/20 with elevated filling pressures and low output heart failure. CO-OX 36% and started on milrinone.  - Stopping milrinone  and lasix drip today.  - Home on torsemide 80 mg daily.   2. Atrial fibrillation: Persistent.  Difficult to control rate and rhythm.  He failed  Tikosyn.  He started on amiodarone, then had atrial fibrillation ablation in 5/19 with ERAF, requiring DCCV.  Most recently cardioverted in 2/20 but back in atrial fibrillation by 3/20 and has stayed in atrial fibrillation since.  He saw [name] and they decided against redo ablation. He is now s/p AV nodal ablation.   - On amio drip due to NSVT/VT. Stop amio drip with transition to Hospice.   -  Continue xarelto.  3. CAD: Extensive coronary calcification on last CT chest.  No chest pain. I suspect that his cardiomyopathy is tachycardia-mediated/atrial fibrillation-related given rise in EF in NSR and then fall when back in atrial fibrillation rather than ischemic cardiomyopathy. - No chest pain.  - No ASA with Xarelto use. - Unable to tolerate statins, Repatha, Zetia or Nexlizet.  He is now waiting to get inclisiran approval.    4. AKI on CKD Stage 3b: Off torsemide and spiro as above. Off ARNI and SGLT2i. - baseline creatinine ~1.8 - Creatinine 2.96 on admit, 1.95 today.   5. Aortic valve disease: TEE in 2/20 showed that aortic valve was not bicuspid (though TTE reports have read bicuspid) but did have mild stenosis and moderate AI.  Moderate AI on 7/20 echo.  Echo in 2/21 showed mild-moderate AI, moderate AS.  Echo in 5/22 with moderate AS and moderate AI. 6. Tricuspid regurgitation: Severe on echo 5/22.  May improve with diuresis as markedly volume overloaded.   7. LBBB: Chronic, now s/p CRT-P.  8. Hyperlipidemia: Unable to tolerate statins, Zetia, Nexlizet, or Repatha.   - Awaiting inclisiran. 9. Carotid stenosis: Repeat carotid dopplers in 6/22. 10. Subclavian stenosis: Suspect left subclavian stenosis.  Not noted on carotid dopplers, but large pulse differential between the arm and difficult to palpate left radial pulse.  Seems asymptomatic. - Medical management, control lipids. - Take BP in right arm.  11. Anemia, iron deficient: Hgb 12.8 (11/21) 8.4 today. Denies overt bleeding.  -   received feraheme 6/18 - transfuse hgb < 7.5  12. Hypokalemia  K 2.9 . Supp K  13. NSVT 6/19 had NSVT- VT. Started on amio drip. Will stop today. Send home on amio 200 mg daily.    Home today with hospice.    Length of Stay: Florien NP-C  11/14/2020, 8:22 AM  Advanced Heart Failure Team Pager [phone] (M-F; 7a - 4p)  Please contact [facility] for night-coverage after hours (4p -7a ) and weekends on [email or web address]  Patient seen and examined with the above-signed Advanced Practice Provider and/or Housestaff. I personally reviewed laboratory data, imaging studies and relevant notes. I independently examined the patient and formulated the important aspects of the plan. I have edited the note to reflect any of my changes or salient points. I have personally discussed the plan with the patient and/or family.  Had physiologic improvement with milrinone but this was only temporary. Has severe underlying RV failure and valvular disease  Has elected to go home with Hospice. He does not want an further aggressive care and he is quite resolute about this.   We have arranged for home hospice. He will discharge today.   Glori Bickers, MD  12:44 PM

## 2020-11-14 NOTE — Telephone Encounter (Signed)
See below

## 2020-11-15 NOTE — Progress Notes (Signed)
Remote pacemaker transmission.   

## 2020-11-16 ENCOUNTER — Other Ambulatory Visit: Payer: Self-pay | Admitting: Gastroenterology

## 2020-11-22 ENCOUNTER — Other Ambulatory Visit: Payer: Self-pay

## 2020-11-22 NOTE — Telephone Encounter (Signed)
  LAST APPOINTMENT DATE: 09/11/2020   NEXT APPOINTMENT DATE:@7 /25/2022  MEDICATION:temazepam (RESTORIL) 15 MG capsule// LORazepam (ATIVAN) 2 MG tablet  PHARMACY:[id number] - [place], [place] - [address] AT [facility] OF [address]  COMMENTS: [facility] also requested for the Ativan to be refilled, Gastro doctor denied. Wanting to know if PCP will refill.

## 2020-11-23 MED ORDER — TEMAZEPAM 15 MG PO CAPS: ORAL_CAPSULE | ORAL | 5 refills | Status: DC

## 2020-11-23 NOTE — Telephone Encounter (Signed)
Pt requesting refills for Temazepam 15 mg, Last OV 09/11/2020. Lorazepam was filled on 6/22.

## 2020-11-29 ENCOUNTER — Ambulatory Visit: Payer: Medicare Other | Admitting: Physician Assistant

## 2020-11-29 ENCOUNTER — Other Ambulatory Visit: Payer: Self-pay

## 2020-11-30 ENCOUNTER — Other Ambulatory Visit: Payer: Self-pay

## 2020-11-30 MED ORDER — TEMAZEPAM 15 MG PO CAPS: ORAL_CAPSULE | ORAL | 5 refills | Status: AC

## 2020-11-30 MED ORDER — TEMAZEPAM 15 MG PO CAPS: ORAL_CAPSULE | ORAL | 5 refills | Status: DC

## 2020-11-30 NOTE — Telephone Encounter (Signed)
[facility] would like for it to be sent in as patients brother is giving them a hard time about the prescription.

## 2020-11-30 NOTE — Telephone Encounter (Signed)
  LAST APPOINTMENT DATE: 11/22/2020   NEXT APPOINTMENT DATE:@7 /25/2022  MEDICATION: temazepam (RESTORIL) 15 MG capsule  PHARMACY: [facility] [place].   COMMENTS: [facility] on [address] is temporarily is closed

## 2020-11-30 NOTE — Telephone Encounter (Signed)
We discuss this via teams

## 2020-11-30 NOTE — Telephone Encounter (Signed)
Can you reach out to [facility] for their fax number and fax the below Rx to them please?

## 2020-11-30 NOTE — Addendum Note (Signed)
Addended by: Clyde Lundborg A on: 11/30/2020 10:20 AM   Modules accepted: Orders

## 2020-12-04 ENCOUNTER — Other Ambulatory Visit: Payer: Medicare Other

## 2020-12-05 ENCOUNTER — Telehealth (HOSPITAL_COMMUNITY): Payer: Self-pay | Admitting: Family Medicine

## 2020-12-05 ENCOUNTER — Encounter (HOSPITAL_COMMUNITY): Payer: Medicare Other

## 2020-12-05 NOTE — Telephone Encounter (Signed)
Called patient, unable to leave message as mailbox was full.   I called his friend listed in his emergency contacts, Travis [name], and notified her of Travis [name]'s appointment today. I explained to her that since he is being followed by hospice, it was not necessary for him to come to clinic today unless he wishes to do so. Travis [name] says Travis [name] is very weak and would likely not be up for the visit, but she would pass the message along. She thanked me for the call and wanted to express her gratitude to [name] as well.  Allena Katz, FNP-BC

## 2020-12-17 ENCOUNTER — Telehealth: Payer: Medicare Other

## 2020-12-17 NOTE — Progress Notes (Deleted)
Chronic Care Management Pharmacy Note  12/17/2020 Name:  Travis [name] MRN:  [id number] DOB:  03-05-1941  Summary: ***  Recommendations/Changes made from today's visit: ***  Plan: ***   Subjective: Travis [name] is an 80 y.o. year old male who is a primary patient of Hunter, Brayton Mars, MD.  The CCM team was consulted for assistance with disease management and care coordination needs.    {CCMTELEPHONEFACETOFACE:21091510} for {CCMINITIALFOLLOWUPCHOICE:21091511} in response to provider referral for pharmacy case management and/or care coordination services.   Consent to Services:  {CCMCONSENTOPTIONS:25074}  Patient Care Team: [name], MD as PCP - General (Family Medicine) Larey Dresser, MD as PCP - Advanced Heart Failure (Cardiology) Larey Dresser, MD as Consulting Physician (Cardiology) Milus Banister, MD as Attending Physician (Gastroenterology) Jarome Matin, MD as Consulting Physician (Dermatology) Rutherford Guys, MD as Consulting Physician (Ophthalmology) Paralee Cancel, MD as Consulting Physician (Orthopedic Surgery) Joycie Peek, MD (General Practice) Madelin Rear, [facility] (Pharmacist) Madelin Rear, [facility] as Pharmacist (Pharmacist)  Recent office visits: ***  Recent consult visits: [facility] visits: {Hospital DC Yes/No:25215}   Objective:  Lab Results  Component Value Date   CREATININE 1.95 (H) 11/14/2020   BUN 50 (H) 11/14/2020   GFR 56.78 (L) 03/04/2018   GFRNONAA 34 (L) 11/14/2020   GFRAA 51 (L) 02/02/2020   NA 130 (L) 11/14/2020   K 2.9 (L) 11/14/2020   CALCIUM 9.0 11/14/2020   CO2 24 11/14/2020   GLUCOSE 117 (H) 11/14/2020    Lab Results  Component Value Date/Time   HGBA1C 5.8 08/27/2007 02:41 PM   GFR 56.78 (L) 03/04/2018 11:30 AM   GFR 39.01 (L) 02/10/2018 12:26 PM    Last diabetic Eye exam: No results found for: HMDIABEYEEXA  Last diabetic Foot exam: No results found for: HMDIABFOOTEX   Lab Results  Component Value  Date   CHOL 190 02/02/2020   HDL 55 02/02/2020   LDLCALC 118 (H) 02/02/2020   LDLDIRECT 84 12/06/2019   TRIG 94 02/02/2020   CHOLHDL 3.5 02/02/2020    Hepatic Function Latest Ref Rng & Units 11/11/2020 02/02/2020 12/06/2018  Total Protein 6.5 - 8.1 g/dL 6.5 7.3 7.0  Albumin 3.5 - 5.0 g/dL 3.6 4.4 3.6  AST 15 - 41 U/L 43(H) 20 22  ALT 0 - 44 U/L 54(H) 11 15  Alk Phosphatase 38 - 126 U/L 136(H) 103 100  Total Bilirubin 0.3 - 1.2 mg/dL 1.3(H) 0.4 0.8  Bilirubin, Direct 0.00 - 0.40 mg/dL - - -    Lab Results  Component Value Date/Time   TSH 1.607 11/11/2020 09:17 PM   TSH 4.279 12/21/2018 09:48 AM   TSH 1.26 02/09/2013 10:04 AM   TSH 3.674 12/18/2012 07:45 PM   FREET4 1.15 (H) 12/21/2018 09:48 AM    CBC Latest Ref Rng & Units 11/14/2020 11/13/2020 11/12/2020  WBC 4.0 - 10.5 K/uL 10.1 9.3 -  Hemoglobin 13.0 - 17.0 g/dL 8.4(L) 7.8(L) 8.8(L)  Hematocrit 39.0 - 52.0 % 26.2(L) 24.2(L) 26.0(L)  Platelets 150 - 400 K/uL 210 210 -    No results found for: VD25OH  Clinical ASCVD: {YES/NO:21197} The ASCVD Risk score Mikey Bussing DC Jr., et al., 2013) failed to calculate for the following reasons:   The 2013 ASCVD risk score is only valid for ages 23 to 48    Depression screen PHQ 2/9 08/30/2020 03/15/2020 02/07/2019  Decreased Interest 0 0 0  Down, Depressed, Hopeless 0 0 0  PHQ - 2 Score 0 0 0  Some recent data might be hidden     ***Other: (CHADS2VASc if Afib, MMRC or CAT for COPD, ACT, DEXA)  Social History   Tobacco Use  Smoking Status Former   Packs/day: 1.50   Years: 25.00   Pack years: 37.50   Types: Cigarettes   Quit date: 05/26/1986   Years since quitting: 34.5  Smokeless Tobacco Never   BP Readings from Last 3 Encounters:  11/14/20 121/79  11/09/20 (!) 90/50  10/23/20 90/60   Pulse Readings from Last 3 Encounters:  11/14/20 98  11/09/20 83  10/23/20 69   Wt Readings from Last 3 Encounters:  11/14/20 154 lb 9.6 oz (70.1 kg)  11/09/20 165 lb 9.6 oz (75.1 kg)   10/23/20 170 lb (77.1 kg)   BMI Readings from Last 3 Encounters:  11/14/20 23.51 kg/m  11/09/20 25.18 kg/m  10/23/20 25.85 kg/m    Assessment/Interventions: Review of patient past medical history, allergies, medications, health status, including review of consultants reports, laboratory and other test data, was performed as part of comprehensive evaluation and provision of chronic care management services.   SDOH:  (Social Determinants of Health) assessments and interventions performed: {yes/no:20286}  SDOH Screenings   Alcohol Screen: Not on file  Depression (PHQ2-9): Low Risk    PHQ-2 Score: 0  Financial Resource Strain: Low Risk    Difficulty of Paying Living Expenses: Not hard at all  Food Insecurity: No Food Insecurity   Worried About Charity fundraiser in the Last Year: Never true   Ran Out of Food in the Last Year: Never true  Housing: Low Risk    Last Housing Risk Score: 0  Physical Activity: Insufficiently Active   Days of Exercise per Week: 2 days   Minutes of Exercise per Session: 50 min  Social Connections: Socially Isolated   Frequency of Communication with Friends and Family: More than three times a week   Frequency of Social Gatherings with Friends and Family: More than three times a week   Attends Religious Services: Never   Marine scientist or Organizations: No   Attends Music therapist: Never   Marital Status: Never married  Stress: No Stress Concern Present   Feeling of Stress : Not at all  Tobacco Use: Medium Risk   Smoking Tobacco Use: Former   Smokeless Tobacco Use: Never  Transportation Needs: No Data processing manager (Medical): No   Lack of Transportation (Non-Medical): No    CCM Care Plan  Allergies  Allergen Reactions   Atorvastatin Other (See Comments)    Severe fatigue on 28m and 410mdosing   Nexlizet [Bempedoic Acid-Ezetimibe]     myalgias   Repatha [Evolocumab] Other (See Comments)     Leg weakness   Rosuvastatin Other (See Comments)    myalgias on 59m31maily    Medications Reviewed Today     Reviewed by SatElyn PeersPhT (Pharmacy Technician) on 11/09/20 at 165Boisest Status: Complete   Medication Order Taking? Sig Documenting Provider Last Dose Status Informant  acetaminophen (TYLENOL) 500 MG tablet [id number] Take 1,000 mg by mouth every 8 (eight) hours as needed for moderate pain or headache. [provider] 11/09/2020 Active Self  carvedilol (COREG) 12.5 MG tablet [id number] Take 12.5 mg by mouth 2 (two) times daily with a meal. [provider] 11/09/2020 0800 Active Self  Coenzyme Q10 200 MG capsule [id number] Take 1 capsule (200 mg total) by mouth daily.  Larey Dresser, MD Past Week Active Self  finasteride (PROPECIA) 1 MG tablet [id number] Yes TAKE 1 TABLET BY MOUTH EVERY DAY  Patient taking differently: Take 1 mg by mouth daily.   [name], MD 11/09/2020 Active Self  Multiple Vitamins-Minerals (PRESERVISION AREDS 2+MULTI VIT PO) [id number] Yes Take 1 tablet by mouth in the morning and at bedtime. [provider] 11/09/2020 Active Self  mupirocin ointment (BACTROBAN) 2 % [id number] Yes Apply 1 application topically daily. [provider] 11/09/2020 Active Self  pantoprazole (PROTONIX) 40 MG tablet [id number] Yes Take 40 mg by mouth 2 (two) times daily. [provider] 11/09/2020 Active Self  patiromer (VELTASSA) 8.4 g packet [id number] Yes Take 1 packet (8.4 g total) by mouth daily. Larey Dresser, MD 11/09/2020 Active Self  Rivaroxaban (XARELTO) 15 MG TABS tablet [id number] Yes TAKE 1 TABLET BY MOUTH DAILY WITH SUPPER  Patient taking differently: Take 15 mg by mouth daily with supper.   Larey Dresser, MD 11/08/2020 2000 Active Self  spironolactone (ALDACTONE) 25 MG tablet [id number] Yes Take 1 tablet (25 mg total) by mouth daily. Larey Dresser, MD 11/09/2020 Active Self  temazepam (RESTORIL) 15  MG capsule [id number] Yes TAKE 1 CAPSULE(15 MG) BY MOUTH AT BEDTIME AS NEEDED FOR SLEEP  Patient taking differently: Take 15 mg by mouth at bedtime.   [name], MD 11/08/2020 Active Self  torsemide (DEMADEX) 20 MG tablet [id number] Yes Take 3 tablets (60 mg total) by mouth daily. Larey Dresser, MD 11/09/2020 Active Self  traMADol (ULTRAM) 50 MG tablet [id number] Yes TAKE 1 TABLET BY MOUTH THREE TIMES DAILY AS NEEDED FOR MODERATE OR SEVERE ARTHRITIS PAIN IN HANDS AND NECK  Patient taking differently: Take 50 mg by mouth 3 (three) times daily as needed for moderate pain.   [name], MD 11/08/2020 Active Self            Patient Active Problem List   Diagnosis Date Noted   Acute on chronic heart failure ([place]) 11/09/2020   Presence of biventricular cardiac pacemaker - MDT 04/17/2020   Statin myopathy 12/06/2019   Acute blood loss anemia 03/01/2018   Pyloric stenosis, acquired 02/28/2018   Duodenal ulcer, with obstruction 02/28/2018   GI bleed 94/32/7614   Chronic systolic heart failure ([place]) 10/10/2017   Persistent atrial fibrillation 10/06/2017   Symptomatic anemia 08/01/2015   Hyperlipidemia 12/19/2014   Male pattern baldness 03/06/2014   Anxiety state 03/06/2014   Insomnia 03/06/2014   Overweight (BMI 25.0-29.9) 02/08/2014   S/P TKR (total knee replacement) 02/08/2014   Sinus bradycardia 01/04/2013   Pulmonary hypertension ([place]) 03/09/2012   Hyponatremia 02/21/2012   Bicuspid aortic valve    Hypertension    Cardiomyopathy, rate related with resolution now recurrent 12/08/2011   Left bundle branch block 12/10/2010   DRY MOUTH 11/07/2009   Carotid bruit 07/05/2009   GERD 01/07/2008   Osteoarthritis 02/04/2007   GANGLION CYST, WRIST, LEFT 02/04/2007    Immunization History  Administered Date(s) Administered   Fluad Quad(high Dose 65+) 02/07/2019, 03/15/2020   Influenza Split 03/04/2011, 02/04/2012   Influenza Whole 04/17/2004, 03/31/2007, 02/17/2008,  04/12/2009, 03/13/2010   Influenza, High Dose Seasonal PF 03/17/2016, 02/03/2017, 02/04/2018   Influenza,inj,Quad PF,6+ Mos 02/09/2013, 03/06/2014, 01/30/2015   PFIZER(Purple Top)SARS-COV-2 Vaccination 07/17/2019, 08/10/2019, 02/21/2020   Pneumococcal Conjugate-13 12/19/2014   Pneumococcal Polysaccharide-23 04/12/2009   Td 04/17/2004   Zoster Recombinat (Shingrix) 09/22/2016, 11/22/2016   Zoster, Live 09/25/2010    Conditions to be addressed/monitored:  Hypertension,  Hyperlipidemia, and Atrial Fibrillation  There are no care plans that you recently modified to display for this patient.    Medication Assistance: {MEDASSISTANCEINFO:25044}  Compliance/Adherence/Medication fill history: Care Gaps: ***  Star-Rating Drugs: ***  Patient's preferred pharmacy is:  [id number] - [place], [place] [address] Phone: [phone] Fax: [phone]  [id number] [place] [place]Phone: [phone]: [phone] [facility] [address] [place]: [phone]: [phone] [phone] [place] [address]Phone: [phone]: [phone] Uses pill box? {Yes or If no, why not?:20788} Pt endorses ***% compliance  We discussed: {Pharmacy options:24294} Patient decided to: {US Pharmacy Plan:23885}  Care Plan and Follow Up Patient Decision:  {FOLLOWUP:24991}  Plan: {CM FOLLOW UP PFEXM:14709} ***   Current Barriers:  {pharmacybarriers:24917}  Pharmacist Clinical Goal(s):  Patient will {PHARMACYGOALCHOICES:24921} through collaboration with PharmD and provider.    Interventions: 1:1 collaboration with HMarin Olp MD regarding development and update of comprehensive plan of care as evidenced by provider attestation and co-signature Inter-disciplinary care team collaboration (see longitudinal plan of care) Comprehensive medication review performed; medication list updated in electronic medical record  Hyperlipidemia: (LDL goal < ***) -{US controlled/uncontrolled:25276} -Current treatment: *** -Medications previously tried: ***  -Current dietary patterns: *** -Current exercise habits: *** -Educated on {CCM HLD Counseling:25126} -{CCMPHARMDINTERVENTION:25122}  Atrial Fibrillation (Goal: prevent stroke and major bleeding) -{US controlled/uncontrolled:25276} -CHADSVASC: *** -Current treatment: Rate control: Amiodarone 2084mdaily Anticoagulation: Xarelto 1598maily -Medications previously tried: *** -Home BP and HR readings: ***  -Counseled on {CCMAFIBCOUNSELING:25120} -{CCMPHARMDINTERVENTION:25122}  Patient Goals/Self-Care Activities Patient will:  - {pharmacypatientgoals:24919}  Follow Up Plan: {CM FOLLOW UP PLAKHVF:47340}

## 2020-12-24 DEATH — deceased

## 2021-09-05 ENCOUNTER — Ambulatory Visit: Payer: Medicare Other
